# Patient Record
Sex: Female | Born: 1935
Health system: Southern US, Community
[De-identification: ages and names within clinical notes are randomized; demographics above are authoritative.]

## PROBLEM LIST (undated history)

## (undated) DIAGNOSIS — E039 Hypothyroidism, unspecified: Secondary | ICD-10-CM

## (undated) DIAGNOSIS — Z808 Family history of malignant neoplasm of other organs or systems: Secondary | ICD-10-CM

## (undated) DIAGNOSIS — Z8619 Personal history of other infectious and parasitic diseases: Secondary | ICD-10-CM

## (undated) DIAGNOSIS — I10 Essential (primary) hypertension: Secondary | ICD-10-CM

## (undated) DIAGNOSIS — E162 Hypoglycemia, unspecified: Secondary | ICD-10-CM

## (undated) DIAGNOSIS — M8580 Other specified disorders of bone density and structure, unspecified site: Secondary | ICD-10-CM

## (undated) DIAGNOSIS — Z803 Family history of malignant neoplasm of breast: Secondary | ICD-10-CM

## (undated) DIAGNOSIS — I872 Venous insufficiency (chronic) (peripheral): Secondary | ICD-10-CM

## (undated) DIAGNOSIS — L409 Psoriasis, unspecified: Secondary | ICD-10-CM

## (undated) DIAGNOSIS — K219 Gastro-esophageal reflux disease without esophagitis: Secondary | ICD-10-CM

## (undated) DIAGNOSIS — C4491 Basal cell carcinoma of skin, unspecified: Secondary | ICD-10-CM

## (undated) DIAGNOSIS — Z85828 Personal history of other malignant neoplasm of skin: Secondary | ICD-10-CM

## (undated) DIAGNOSIS — L980 Pyogenic granuloma: Secondary | ICD-10-CM

## (undated) DIAGNOSIS — L0390 Cellulitis, unspecified: Secondary | ICD-10-CM

## (undated) DIAGNOSIS — I890 Lymphedema, not elsewhere classified: Secondary | ICD-10-CM

## (undated) DIAGNOSIS — M1990 Unspecified osteoarthritis, unspecified site: Secondary | ICD-10-CM

## (undated) HISTORY — DX: Cellulitis, unspecified: L03.90

## (undated) HISTORY — DX: Pyogenic granuloma: L98.0

## (undated) HISTORY — DX: Family history of malignant neoplasm of other organs or systems: Z80.8

## (undated) HISTORY — DX: Hypothyroidism, unspecified: E03.9

## (undated) HISTORY — DX: Family history of malignant neoplasm of breast: Z80.3

## (undated) HISTORY — PX: BREAST BIOPSY: SHX20

## (undated) HISTORY — DX: Personal history of other infectious and parasitic diseases: Z86.19

## (undated) HISTORY — PX: CATARACT EXTRACTION: SUR2

## (undated) HISTORY — DX: Personal history of other malignant neoplasm of skin: Z85.828

## (undated) HISTORY — PX: VEIN LIGATION AND STRIPPING: SHX2653

## (undated) HISTORY — PX: BASAL CELL CARCINOMA EXCISION: SHX1214

## (undated) HISTORY — DX: Venous insufficiency (chronic) (peripheral): I87.2

## (undated) HISTORY — DX: Other specified disorders of bone density and structure, unspecified site: M85.80

## (undated) HISTORY — DX: Basal cell carcinoma of skin, unspecified: C44.91

## (undated) HISTORY — DX: Unspecified osteoarthritis, unspecified site: M19.90

---

## 2003-12-29 HISTORY — PX: NASAL RECONSTRUCTION: SHX2069

## 2008-07-09 ENCOUNTER — Emergency Department: Payer: Self-pay | Admitting: Emergency Medicine

## 2008-07-16 ENCOUNTER — Emergency Department: Payer: Self-pay | Admitting: Emergency Medicine

## 2009-06-27 DIAGNOSIS — M8580 Other specified disorders of bone density and structure, unspecified site: Secondary | ICD-10-CM

## 2009-06-27 HISTORY — DX: Other specified disorders of bone density and structure, unspecified site: M85.80

## 2009-06-27 HISTORY — PX: OTHER SURGICAL HISTORY: SHX169

## 2011-03-16 ENCOUNTER — Other Ambulatory Visit: Payer: Self-pay

## 2011-06-09 ENCOUNTER — Encounter: Payer: Self-pay | Admitting: Psychiatry

## 2011-06-28 ENCOUNTER — Encounter: Payer: Self-pay | Admitting: Psychiatry

## 2011-07-29 ENCOUNTER — Encounter: Payer: Self-pay | Admitting: Psychiatry

## 2011-07-30 ENCOUNTER — Ambulatory Visit (INDEPENDENT_AMBULATORY_CARE_PROVIDER_SITE_OTHER): Payer: Medicare Other | Admitting: Family Medicine

## 2011-07-30 ENCOUNTER — Encounter: Payer: Self-pay | Admitting: Family Medicine

## 2011-07-30 VITALS — BP 136/80 | HR 84 | Temp 99.1°F | Ht 63.5 in | Wt 171.2 lb

## 2011-07-30 DIAGNOSIS — M949 Disorder of cartilage, unspecified: Secondary | ICD-10-CM

## 2011-07-30 DIAGNOSIS — M899 Disorder of bone, unspecified: Secondary | ICD-10-CM

## 2011-07-30 DIAGNOSIS — Z1211 Encounter for screening for malignant neoplasm of colon: Secondary | ICD-10-CM

## 2011-07-30 DIAGNOSIS — M1990 Unspecified osteoarthritis, unspecified site: Secondary | ICD-10-CM | POA: Insufficient documentation

## 2011-07-30 DIAGNOSIS — I872 Venous insufficiency (chronic) (peripheral): Secondary | ICD-10-CM

## 2011-07-30 DIAGNOSIS — H409 Unspecified glaucoma: Secondary | ICD-10-CM

## 2011-07-30 DIAGNOSIS — E039 Hypothyroidism, unspecified: Secondary | ICD-10-CM

## 2011-07-30 DIAGNOSIS — M8580 Other specified disorders of bone density and structure, unspecified site: Secondary | ICD-10-CM

## 2011-07-30 NOTE — Patient Instructions (Addendum)
Revise dosis de calcio y vitamina D y llamenos si distinto de lo que hemos escrito abajo. Para las piernas - seguir [employer] de compresion.  Limite la sal en la dieta.  Mas agua, eleve los pies cuando esta sentada. Para la vacuna shingles (zostavax) y tetano (Tdap) - llame al seguro para ver si esta cubierto y para ver donde prefieren que se administre. Para vacuna contra pneumonia, chequee si fue dada en [place].  si no, la podemos dar aqui. Gusto concerla hoy, llamenos con preguntas.   Pedire records de sus doctores en [place]. Stool kit sent today. Regrese en 3-4 meses para ver como sigue todo.

## 2011-07-30 NOTE — Assessment & Plan Note (Signed)
On evista x >20 yrs, on fosamax, on ca/vit D.

## 2011-07-30 NOTE — Assessment & Plan Note (Signed)
Followed by endo. Will request records from [place].

## 2011-07-30 NOTE — Progress Notes (Signed)
Subjective:    Patient ID: Beth Rodgers, female    DOB: 27-May-1935, 76 y.o.   MRN: [id number]  HPI CC: new pt, establish  76 yo presents to establish care.  Moved recently from [place] 1 1/2 yrs ago.  Still goes there frequently, has doctors there to follow chronic problems.  Sees endocrinologist every 6 mo to follow thyroid (hypothyroid). H/o BCC in nose 7 yrs ago s/p extensive nasal surgery.  To travel back to [facility] tomorrow. H/o vein stripping.  H/o cataract surgery.  H/o elevated intraocular pressure, glaucoma. H/o chronic venous insufficiency since young age, uses compression stockings 30-40mmHg intermittently.  H/o stasis dermatitis. H/o back arthritis, h/o knee pain never had surgery. H/o osteopenia.  Currently undergoing PT for achilles tedon issue, followed by [facility] podiatry.  evista started at menopause ~20 yrs ago.  No abnl uterine bleeding.  Preventative: States UTD mammogram (2011), never had colonoscopy, declines would consider stool kit. No recent pap smear, last had 7 yrs ago.  Painful, doesn't think would like to rpt. States had normal cholesterol levels in past. Last tetanus >10 yrs ago. No shingles shot, no pneumonia shot that pt knows.  Medications and allergies reviewed and updated in chart. There is no problem list on file for this patient.  Past Medical History  Diagnosis Date  . Osteoarthritis     back pain, L knee pain  . Osteopenia   . Hypothyroid   . History of chicken pox   . Glaucoma   . Chronic venous insufficiency   . History  of basal cell carcinoma    Past Surgical History  Procedure Date  . Cataract extraction     bilateral  . Vein ligation and stripping   . Nasal reconstruction 2005    for BCC L nare   History  Substance Use Topics  . Smoking status: Never Smoker   . Smokeless tobacco: Never Used  . Alcohol Use: Yes     Occasional   Family History  Problem Relation Age of Onset  . Cancer Mother     thyroid cancer  . Heart disease  Father     CHF  . Diabetes Father   . Coronary artery disease Maternal Uncle   . Stroke Neg Hx    No Known Allergies No current outpatient prescriptions on file prior to visit.   Review of Systems  Constitutional: Negative for fever, chills, activity change, appetite change, fatigue and unexpected weight change.  HENT: Negative for hearing loss and neck pain.   Eyes: Negative for visual disturbance.  Respiratory: Negative for cough, chest tightness, shortness of breath and wheezing.   Cardiovascular: Negative for chest pain, palpitations and leg swelling.  Gastrointestinal: Negative for nausea, vomiting, abdominal pain, diarrhea, constipation, blood in stool and abdominal distention.  Genitourinary: Negative for hematuria and difficulty urinating.  Musculoskeletal: Negative for myalgias and arthralgias.  Skin: Negative for rash.  Neurological: Negative for dizziness, seizures, syncope and headaches.  Hematological: Does not bruise/bleed easily.  Psychiatric/Behavioral: Negative for dysphoric mood. The patient is not nervous/anxious.       Objective:   Physical Exam  Nursing note and vitals reviewed. Constitutional: She is oriented to person, place, and time. She appears well-developed and well-nourished. No distress.  HENT:  Head: Normocephalic and atraumatic.  Right Ear: External ear normal.  Left Ear: External ear normal.  Nose: Nose normal.  Mouth/Throat: Oropharynx is clear and moist.  Eyes: Conjunctivae and EOM are normal. Pupils are equal, round, and reactive to  light.  Neck: Normal range of motion. Neck supple. No thyromegaly present.  Cardiovascular: Normal rate, regular rhythm, normal heart sounds and intact distal pulses.   No murmur heard. Pulses:      Radial pulses are 2+ on the right side, and 2+ on the left side.  Pulmonary/Chest: Effort normal and breath sounds normal. No respiratory distress. She has no wheezes. She has no rales.  Abdominal: Soft. Bowel sounds  are normal. She exhibits no distension and no mass. There is no tenderness. There is no rebound and no guarding.  Musculoskeletal: Normal range of motion.  Lymphadenopathy:    She has no cervical adenopathy.  Neurological: She is alert and oriented to person, place, and time.       CN grossly intact, station and gait intact  Skin: Skin is warm and dry. No rash noted.  Psychiatric: She has a normal mood and affect. Her behavior is normal. Judgment and thought content normal.          Assessment & Plan:

## 2011-07-30 NOTE — Assessment & Plan Note (Signed)
Followed by ophtho.

## 2011-07-30 NOTE — Assessment & Plan Note (Signed)
Discussed limiting salt, elevating legs, continued use of compression stockings.

## 2011-07-30 NOTE — Assessment & Plan Note (Signed)
Request records from prior pcp.

## 2011-07-30 NOTE — Assessment & Plan Note (Signed)
Set up with iFOB.

## 2011-08-05 ENCOUNTER — Other Ambulatory Visit: Payer: Medicare Other

## 2011-08-06 ENCOUNTER — Other Ambulatory Visit: Payer: Self-pay | Admitting: Family Medicine

## 2011-08-06 ENCOUNTER — Encounter: Payer: Self-pay | Admitting: *Deleted

## 2011-08-06 DIAGNOSIS — Z1211 Encounter for screening for malignant neoplasm of colon: Secondary | ICD-10-CM

## 2011-08-06 LAB — FECAL OCCULT BLOOD, IMMUNOCHEMICAL: Fecal Occult Bld: NEGATIVE

## 2011-08-29 ENCOUNTER — Encounter: Payer: Self-pay | Admitting: Psychiatry

## 2012-01-29 HISTORY — PX: RADIOFREQUENCY ABLATION: SHX2290

## 2012-02-03 DIAGNOSIS — I8310 Varicose veins of unspecified lower extremity with inflammation: Secondary | ICD-10-CM | POA: Diagnosis not present

## 2012-02-11 DIAGNOSIS — I872 Venous insufficiency (chronic) (peripheral): Secondary | ICD-10-CM | POA: Diagnosis not present

## 2012-02-11 DIAGNOSIS — M7989 Other specified soft tissue disorders: Secondary | ICD-10-CM | POA: Diagnosis not present

## 2012-02-11 DIAGNOSIS — Z9889 Other specified postprocedural states: Secondary | ICD-10-CM | POA: Diagnosis not present

## 2012-02-11 DIAGNOSIS — E039 Hypothyroidism, unspecified: Secondary | ICD-10-CM | POA: Diagnosis not present

## 2012-02-12 DIAGNOSIS — J209 Acute bronchitis, unspecified: Secondary | ICD-10-CM | POA: Diagnosis not present

## 2012-02-24 DIAGNOSIS — I872 Venous insufficiency (chronic) (peripheral): Secondary | ICD-10-CM | POA: Diagnosis not present

## 2012-02-24 DIAGNOSIS — Z9889 Other specified postprocedural states: Secondary | ICD-10-CM | POA: Diagnosis not present

## 2012-02-24 DIAGNOSIS — E785 Hyperlipidemia, unspecified: Secondary | ICD-10-CM | POA: Diagnosis not present

## 2012-02-24 DIAGNOSIS — I83893 Varicose veins of bilateral lower extremities with other complications: Secondary | ICD-10-CM | POA: Diagnosis not present

## 2012-02-24 DIAGNOSIS — I10 Essential (primary) hypertension: Secondary | ICD-10-CM | POA: Diagnosis not present

## 2012-02-24 DIAGNOSIS — E039 Hypothyroidism, unspecified: Secondary | ICD-10-CM | POA: Diagnosis not present

## 2012-03-03 DIAGNOSIS — I872 Venous insufficiency (chronic) (peripheral): Secondary | ICD-10-CM | POA: Diagnosis not present

## 2012-03-03 DIAGNOSIS — M7989 Other specified soft tissue disorders: Secondary | ICD-10-CM | POA: Diagnosis not present

## 2012-03-03 DIAGNOSIS — I749 Embolism and thrombosis of unspecified artery: Secondary | ICD-10-CM | POA: Diagnosis not present

## 2012-03-03 DIAGNOSIS — Z9889 Other specified postprocedural states: Secondary | ICD-10-CM | POA: Diagnosis not present

## 2012-03-03 DIAGNOSIS — M79609 Pain in unspecified limb: Secondary | ICD-10-CM | POA: Diagnosis not present

## 2012-03-03 DIAGNOSIS — I8310 Varicose veins of unspecified lower extremity with inflammation: Secondary | ICD-10-CM | POA: Diagnosis not present

## 2012-03-30 ENCOUNTER — Ambulatory Visit (INDEPENDENT_AMBULATORY_CARE_PROVIDER_SITE_OTHER): Payer: Medicare Other | Admitting: Family Medicine

## 2012-03-30 ENCOUNTER — Encounter: Payer: Self-pay | Admitting: Family Medicine

## 2012-03-30 VITALS — BP 124/82 | HR 68 | Temp 98.4°F | Wt 174.0 lb

## 2012-03-30 DIAGNOSIS — M8580 Other specified disorders of bone density and structure, unspecified site: Secondary | ICD-10-CM

## 2012-03-30 DIAGNOSIS — Z1231 Encounter for screening mammogram for malignant neoplasm of breast: Secondary | ICD-10-CM

## 2012-03-30 DIAGNOSIS — Z6379 Other stressful life events affecting family and household: Secondary | ICD-10-CM | POA: Insufficient documentation

## 2012-03-30 DIAGNOSIS — Z01419 Encounter for gynecological examination (general) (routine) without abnormal findings: Secondary | ICD-10-CM

## 2012-03-30 DIAGNOSIS — I872 Venous insufficiency (chronic) (peripheral): Secondary | ICD-10-CM

## 2012-03-30 DIAGNOSIS — Z639 Problem related to primary support group, unspecified: Secondary | ICD-10-CM

## 2012-03-30 DIAGNOSIS — Z1211 Encounter for screening for malignant neoplasm of colon: Secondary | ICD-10-CM

## 2012-03-30 DIAGNOSIS — Z0000 Encounter for general adult medical examination without abnormal findings: Secondary | ICD-10-CM | POA: Diagnosis not present

## 2012-03-30 DIAGNOSIS — M899 Disorder of bone, unspecified: Secondary | ICD-10-CM

## 2012-03-30 DIAGNOSIS — E039 Hypothyroidism, unspecified: Secondary | ICD-10-CM

## 2012-03-30 DIAGNOSIS — Z85828 Personal history of other malignant neoplasm of skin: Secondary | ICD-10-CM
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
## 2012-03-30 NOTE — Assessment & Plan Note (Signed)
With varicose veins - would like referral to local vascular surgeon for second opinion. Will request records from procedure done in [place] and refer.
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
## 2012-03-30 NOTE — Patient Instructions (Signed)
Pass by Beth Rodgers's office for referral for mammogram and GYN referral as well as VVS referral. Yo voy a consultar con la Dr. Laymond Purser. Regresar en ayunas cuando pueda para chequear sangre y luego para medicare wellness check. Return at your convenience for blood work and afterwards for [employer] check. pida records de sus doctores (especialmente cirugano vascular).

## 2012-03-30 NOTE — Assessment & Plan Note (Signed)
Normal iFOB 07/2011.  Declines colonoscopy.

## 2012-03-30 NOTE — Assessment & Plan Note (Signed)
No results found for this basename: TSH  due for TSH check - will check with next blood work (when returns fasting for CPE labs)

## 2012-03-30 NOTE — Assessment & Plan Note (Signed)
Refer to GYN for well woman. Return at her convenience fasting for blood work and afterwards for [employer] visit.

## 2012-04-03 ENCOUNTER — Encounter: Payer: Self-pay | Admitting: Family Medicine

## 2012-04-05 DIAGNOSIS — L97919 Non-pressure chronic ulcer of unspecified part of right lower leg with unspecified severity: Secondary | ICD-10-CM | POA: Diagnosis not present

## 2012-04-05 DIAGNOSIS — L97929 Non-pressure chronic ulcer of unspecified part of left lower leg with unspecified severity: Secondary | ICD-10-CM | POA: Diagnosis not present

## 2012-04-08 ENCOUNTER — Ambulatory Visit (INDEPENDENT_AMBULATORY_CARE_PROVIDER_SITE_OTHER): Payer: Medicare Other | Admitting: Family Medicine

## 2012-04-08 ENCOUNTER — Telehealth: Payer: Self-pay | Admitting: Family Medicine

## 2012-04-08 ENCOUNTER — Encounter: Payer: Self-pay | Admitting: Family Medicine

## 2012-04-08 VITALS — BP 130/80 | HR 84 | Temp 97.8°F | Wt 176.8 lb

## 2012-04-08 DIAGNOSIS — Z639 Problem related to primary support group, unspecified: Secondary | ICD-10-CM

## 2012-04-08 NOTE — Telephone Encounter (Signed)
Patient is asking for Dr.Gutierrez to call her.  Patient needs advice from Dr.Gutierrez.  She said she'll be driving here to see him,if she doesn't hear from him.

## 2012-04-08 NOTE — Telephone Encounter (Signed)
At her last visit she mentioned to you that she had a bipolar son and you made an appt with psy appt but, she is having a hard time with anxiety. Discussed with dr g and he will see her at 4:45 or will call her after work

## 2012-04-09 ENCOUNTER — Encounter: Payer: Self-pay | Admitting: Family Medicine
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
## 2012-04-12 ENCOUNTER — Telehealth: Payer: Self-pay | Admitting: Family Medicine

## 2012-04-12 NOTE — Telephone Encounter (Signed)
I spoke with [facility] RE patient. Please notify patient that social worker with [facility] will be in contact with her regarding referral that was made in next 10 days.  To please let me know in interim if any concerns. Will route to [facility] as [place] as well.

## 2012-04-13 NOTE — Telephone Encounter (Signed)
Patient notified and will await a call from [facility].

## 2012-04-19 ENCOUNTER — Encounter: Payer: Self-pay | Admitting: Obstetrics & Gynecology

## 2012-04-19 ENCOUNTER — Other Ambulatory Visit (HOSPITAL_COMMUNITY)
Admission: RE | Admit: 2012-04-19 | Discharge: 2012-04-19 | Disposition: A | Discharge status: Home / Self Care | Payer: Medicare Other | Source: Ambulatory Visit | Attending: Obstetrics & Gynecology | Admitting: Obstetrics & Gynecology

## 2012-04-19 ENCOUNTER — Ambulatory Visit (INDEPENDENT_AMBULATORY_CARE_PROVIDER_SITE_OTHER): Payer: Medicare Other | Admitting: Obstetrics & Gynecology

## 2012-04-19 VITALS — BP 158/88 | HR 71 | Ht 66.0 in | Wt 173.0 lb

## 2012-04-19 DIAGNOSIS — Z124 Encounter for screening for malignant neoplasm of cervix: Secondary | ICD-10-CM | POA: Diagnosis not present

## 2012-04-19 DIAGNOSIS — Z01419 Encounter for gynecological examination (general) (routine) without abnormal findings: Secondary | ICD-10-CM

## 2012-04-19 DIAGNOSIS — Z0000 Encounter for general adult medical examination without abnormal findings: Secondary | ICD-10-CM

## 2012-04-19 NOTE — Progress Notes (Signed)
Patient is here for gyn exam

## 2012-04-19 NOTE — Progress Notes (Signed)
Subjective:    Beth Rodgers is a 77 y.o. female who presents for an annual exam. The patient has no complaints today. The patient is not currently sexually active. GYN screening history: last pap: was normal. The patient wears seatbelts: yes. The patient participates in regular exercise: no. Has the patient ever been transfused or tattooed?: not asked. The patient reports that there is not domestic violence in her life.   Menstrual History: OB History    Grav Para Term Preterm Abortions TAB SAB Ect Mult Living                  Menarche age: 7 No LMP recorded. Patient is postmenopausal.    The following portions of the patient's history were reviewed and updated as appropriate: allergies, current medications, past family history, past medical history, past social history, past surgical history and problem list.  Review of Systems A comprehensive review of systems was negative.    Objective:    BP 158/88  Pulse 71  Ht 5\' 6"  (1.676 m)  Wt 173 lb (78.472 kg)  BMI 27.92 kg/m2  General Appearance:    Alert, cooperative, no distress, appears stated age  Head:    Normocephalic, without obvious abnormality, atraumatic  Eyes:    PERRL, conjunctiva/corneas clear, EOM's intact, fundi    benign, both eyes  Ears:    Normal TM's and external ear canals, both ears  Nose:   Nares normal, septum midline, mucosa normal, no drainage    or sinus tenderness  Throat:   Lips, mucosa, and tongue normal; teeth and gums normal  Neck:   Supple, symmetrical, trachea midline, no adenopathy;    thyroid:  no enlargement/tenderness/nodules; no carotid   bruit or JVD  Back:     Symmetric, no curvature, ROM normal, no CVA tenderness  Lungs:     Clear to auscultation bilaterally, respirations unlabored  Chest Wall:    No tenderness or deformity   Heart:    Regular rate and rhythm, S1 and S2 normal, no murmur, rub   or gallop  Breast Exam:    No tenderness, masses, or nipple abnormality  Abdomen:     Soft,  non-tender, bowel sounds active all four quadrants,    no masses, no organomegaly  Genitalia:    Normal female without lesion, discharge or tenderness, severe atrophy, NSSA, NT, no adnexal masses     Extremities:   Extremities normal, atraumatic, no cyanosis or edema  Pulses:   2+ and symmetric all extremities  Skin:   Skin color, texture, turgor normal, no rashes or lesions  Lymph nodes:   Cervical, supraclavicular, and axillary nodes normal  Neurologic:   CNII-XII intact, normal strength, sensation and reflexes    throughout  .    Assessment:    Healthy female exam.    Plan:     Mammogram. Pap smear.

## 2012-04-27 ENCOUNTER — Ambulatory Visit: Payer: Medicare Other | Admitting: Psychology

## 2012-05-04 DIAGNOSIS — I83893 Varicose veins of bilateral lower extremities with other complications: Secondary | ICD-10-CM | POA: Diagnosis not present

## 2012-05-04 DIAGNOSIS — I872 Venous insufficiency (chronic) (peripheral): Secondary | ICD-10-CM | POA: Diagnosis not present

## 2012-05-05 ENCOUNTER — Encounter: Payer: Self-pay | Admitting: Family Medicine

## 2012-05-09 ENCOUNTER — Telehealth: Payer: Self-pay

## 2012-05-09 NOTE — Telephone Encounter (Signed)
Pt called requesting medical equipment store near our office. I gave pt [facility] medical # [phone].

## 2012-05-10 ENCOUNTER — Telehealth: Payer: Self-pay | Admitting: Family Medicine

## 2012-05-10 NOTE — Telephone Encounter (Signed)
Patient LMOM that the vascular Dr wanted her to get compression hose and wanted you to tell her where to go in [place] to get them. Also that Social Services has not contacted her yet.

## 2012-05-11 ENCOUNTER — Ambulatory Visit (INDEPENDENT_AMBULATORY_CARE_PROVIDER_SITE_OTHER): Payer: Medicare Other | Admitting: Psychology

## 2012-05-11 DIAGNOSIS — F4323 Adjustment disorder with mixed anxiety and depressed mood: Secondary | ICD-10-CM

## 2012-05-11 NOTE — Telephone Encounter (Signed)
Spoke with [facility]. They said they had tried numerous times to contact patient and every time they called, her number was disconnected. They are going to try again today. I left patient a message advising to expect their call and left info on where she could try to get compression hose. Instructed to call me with any questions.

## 2012-05-11 NOTE — Telephone Encounter (Signed)
plz apologize she has not heard anything from sw - I will check in to this.  Can we check with [facility] about referral?  I placed referral by calling them and speaking directly to them 04/12/2012. As far as durable medical supply store in [place] - can try   [facility]  [address] [place], [place] [address]  Map [phone]    or   [facility] [employer] [facility] [employer]  [address] [place], [place], [place] [address]  Map [phone]  . Or there are several in [place] - I'm not as familiar as in [place].

## 2012-05-17 ENCOUNTER — Other Ambulatory Visit (INDEPENDENT_AMBULATORY_CARE_PROVIDER_SITE_OTHER): Payer: Medicare Other

## 2012-05-17 DIAGNOSIS — E039 Hypothyroidism, unspecified: Secondary | ICD-10-CM | POA: Diagnosis not present

## 2012-05-17 DIAGNOSIS — M899 Disorder of bone, unspecified: Secondary | ICD-10-CM | POA: Diagnosis not present

## 2012-05-17 DIAGNOSIS — M8580 Other specified disorders of bone density and structure, unspecified site: Secondary | ICD-10-CM

## 2012-05-17 LAB — CBC WITH DIFFERENTIAL/PLATELET
Basophils Absolute: 0 10*3/uL (ref 0.0–0.1)
Basophils Relative: 0.4 % (ref 0.0–3.0)
Eosinophils Absolute: 0.1 10*3/uL (ref 0.0–0.7)
Lymphocytes Relative: 30.7 % (ref 12.0–46.0)
MCHC: 33.5 g/dL (ref 30.0–36.0)
MCV: 87.1 fl (ref 78.0–100.0)
Monocytes Absolute: 0.6 10*3/uL (ref 0.1–1.0)
Neutrophils Relative %: 56.1 % (ref 43.0–77.0)
RBC: 4.61 Mil/uL (ref 3.87–5.11)
RDW: 14.6 % (ref 11.5–14.6)

## 2012-05-17 LAB — COMPREHENSIVE METABOLIC PANEL
AST: 17 U/L (ref 0–37)
BUN: 15 mg/dL (ref 6–23)
Calcium: 8.5 mg/dL (ref 8.4–10.5)
Chloride: 108 mEq/L (ref 96–112)
Creatinine, Ser: 0.6 mg/dL (ref 0.4–1.2)
GFR: 101.62 mL/min (ref >60.00)

## 2012-05-17 LAB — LIPID PANEL
Cholesterol: 198 mg/dL (ref 0–200)
LDL Cholesterol: 120 mg/dL — ABNORMAL HIGH (ref 0–99)
Triglycerides: 60 mg/dL (ref 0.0–149.0)
VLDL: 12 mg/dL (ref 0.0–40.0)

## 2012-05-17 LAB — TSH: TSH: 2.93 u[IU]/mL (ref 0.35–5.50)

## 2012-05-18 ENCOUNTER — Ambulatory Visit (INDEPENDENT_AMBULATORY_CARE_PROVIDER_SITE_OTHER): Payer: Medicare Other | Admitting: Psychology

## 2012-05-18 DIAGNOSIS — F4323 Adjustment disorder with mixed anxiety and depressed mood: Secondary | ICD-10-CM

## 2012-05-18 LAB — VITAMIN D 25 HYDROXY (VIT D DEFICIENCY, FRACTURES): Vit D, 25-Hydroxy: 35 ng/mL (ref 30–89)

## 2012-05-24 ENCOUNTER — Ambulatory Visit (INDEPENDENT_AMBULATORY_CARE_PROVIDER_SITE_OTHER): Payer: Medicare Other | Admitting: Family Medicine

## 2012-05-24 ENCOUNTER — Encounter: Payer: Self-pay | Admitting: Family Medicine

## 2012-05-24 VITALS — BP 136/84 | HR 86 | Temp 98.5°F | Ht 66.0 in | Wt 173.5 lb

## 2012-05-24 DIAGNOSIS — M8580 Other specified disorders of bone density and structure, unspecified site: Secondary | ICD-10-CM

## 2012-05-24 DIAGNOSIS — Z23 Encounter for immunization: Secondary | ICD-10-CM

## 2012-05-24 DIAGNOSIS — Z0000 Encounter for general adult medical examination without abnormal findings: Secondary | ICD-10-CM | POA: Diagnosis not present

## 2012-05-24 DIAGNOSIS — I872 Venous insufficiency (chronic) (peripheral): Secondary | ICD-10-CM

## 2012-05-24 DIAGNOSIS — M899 Disorder of bone, unspecified: Secondary | ICD-10-CM

## 2012-05-24 MED ORDER — RALOXIFENE HCL 60 MG PO TABS: 60.0000 mg | ORAL_TABLET | Freq: Every day | ORAL | Status: DC

## 2012-05-24 MED ORDER — LEVOTHYROXINE SODIUM 75 MCG PO TABS: 75.0000 ug | ORAL_TABLET | Freq: Every day | ORAL | Status: DC

## 2012-05-24 MED ORDER — ALENDRONATE SODIUM 70 MG PO TABS: 70.0000 mg | ORAL_TABLET | ORAL | Status: DC

## 2012-05-24 NOTE — Assessment & Plan Note (Signed)
encouraged regular compression stocking use.

## 2012-05-24 NOTE — Assessment & Plan Note (Addendum)
Reviewed recent dexa scan and input into chart - osteopenic.

## 2012-05-24 NOTE — Assessment & Plan Note (Signed)
I have personally reviewed the Medicare Annual Wellness questionnaire and have noted 1. The patient's medical and social history 2. Their use of alcohol, tobacco or illicit drugs 3. Their current medications and supplements 4. The patient's functional ability including ADL's, fall risks, home safety risks and hearing or visual impairment. 5. Diet and physical activity 6. Evidence for depression or mood disorders The patients weight, height, BMI have been recorded in the chart.  Hearing and vision has been addressed. I have made referrals, counseling and provided education to the patient based review of the above and I have provided the pt with a written personalized care plan for preventive services. See scanned questionairre. Advanced directives discussed: provided with packet of information.  Reviewed preventative protocols and updated unless pt declined. Td today. iFOB due 07/2012. Well woman with OBGYN.

## 2012-05-24 NOTE — Progress Notes (Signed)
Subjective:    Patient ID: Beth Rodgers, female    DOB: 06-02-1935, 77 y.o.   MRN: [id number]  HPI CC: medicare wellness  Things going well with son for last week.  Has seen Dr. Laymond Purser which pt feels is helping.    Saw vein doctor (Dr. Anda Kraft at [facility]) - rec compression stockings first thing in am.  Has noted improvement in leg pain and veins.  To return to see her July 3rd.  Urinary hesitance each void.  No accidents, urgency, no stress incontinence sxs.  Overall not bothering her.  Preventative:  Mammogram scheduled 06/13/2012 - at [facility]. Vision screen due 06/2012.  Following elevated eye pressure on left side. Declines colonoscopy, would like yearly stool kit.  Last done 08/06/2011. Pelvic exam 03/2012 with Dr. Marice Potter.  breast exam during well woman with OBGYN. Last tetanus >10 yrs ago. Requests today. No shingles shot no pneumonia shot Occasionally receives flu shot. Advanced directives: has not thought about this.  Would like packet with information.  Denies depression, anhedonia Denies recent falls.  Caffeine: 2-3 cups/day Lives with husband, majority of time alone as he travels to [place]. Has grandchildren nearby. Occ: Psychologist, educational, Wellsite geologist Activity: bed exercises, limiting walking Diet: does get fruits and vegetables, only some water  Medications and allergies reviewed and updated in chart.  Past histories reviewed and updated if relevant as below. Patient Active Problem List  Diagnoses  . Osteoarthritis  . Osteopenia  . Hypothyroid  . Glaucoma  . Chronic venous insufficiency  . Screen for colon cancer  . Unspecified family circumstance  . Medicare annual wellness visit, initial  . History  of basal cell carcinoma   Past Medical History  Diagnosis Date  . Osteoarthritis     back pain, L knee pain  . Osteopenia   . Hypothyroid   . History of chicken pox   . Glaucoma   . Chronic venous insufficiency     with varicose veins  . History  of basal cell  carcinoma     left nare   Past Surgical History  Procedure Date  . Cataract extraction     bilateral  . Vein ligation and stripping remote    bilateral G saphenous veins  . Nasal reconstruction 2005    for BCC L nare s/p Mohs  . Breast biopsy   . Radiofrequency ablation 01/2012    remnant L G saphenous vein below knee   History  Substance Use Topics  . Smoking status: Never Smoker   . Smokeless tobacco: Never Used  . Alcohol Use: Yes     Occasional   Family History  Problem Relation Age of Onset  . Cancer Mother     thyroid cancer  . Heart disease Father     CHF  . Diabetes Father   . Glaucoma Father   . Arthritis Father   . Coronary artery disease Maternal Uncle   . Stroke Neg Hx   . Bipolar disorder Son    No Known Allergies Current Outpatient Prescriptions on File Prior to Visit  Medication Sig Dispense Refill  . alendronate (FOSAMAX) 70 MG tablet Take 70 mg by mouth every 7 (seven) days. Take with a full glass of water on an empty stomach.       . Ascorbic Acid (VITAMIN C CR PO) Take by mouth as directed.        . betamethasone, augmented, (DIPROLENE) 0.05 % lotion Apply 1 application topically 2 (two) times daily.        Marland Kitchen  calcipotriene-betamethasone (TACLONEX) ointment Apply 1 application topically daily.        Marland Kitchen CALCIUM CARBONATE PO Take 600 mg by mouth as directed.       Marland Kitchen CICLOPIROX EX Apply topically as needed.      . Homeopathic Products (ARNICARE) GEL Apply 1 application topically as directed.        . hydrocortisone (PREPARATION H HYDROCORTISONE) 1 % cream Apply 1 application topically 2 (two) times daily.        Marland Kitchen levothyroxine (SYNTHROID, LEVOTHROID) 75 MCG tablet Take 75 mcg by mouth daily.        Marland Kitchen neomycin-bacitracin-polymyxin (NEOSPORIN) ointment Apply 1 application topically every 12 (twelve) hours. apply to eye       . pentoxifylline (TRENTAL) 400 MG CR tablet Take 400 mg by mouth 3 (three) times daily with meals.        . raloxifene (EVISTA) 60  MG tablet Take 60 mg by mouth daily.        . tacrolimus (PROTOPIC) 0.1 % ointment Apply 1 application topically 2 (two) times daily.        . travoprost, benzalkonium, (TRAVATAN) 0.004 % ophthalmic solution Place 1 drop into the left eye at bedtime.        Marland Kitchen VITAMIN D, CHOLECALCIFEROL, PO Take 1,000 Units by mouth as directed.         Review of Systems  Constitutional: Negative for fever, chills, activity change, appetite change, fatigue and unexpected weight change.  HENT: Negative for hearing loss and neck pain.   Eyes: Negative for visual disturbance.  Respiratory: Negative for cough, chest tightness, shortness of breath and wheezing.   Cardiovascular: Negative for chest pain, palpitations and leg swelling.  Gastrointestinal: Negative for nausea, vomiting, abdominal pain, diarrhea, constipation, blood in stool and abdominal distention.  Genitourinary: Negative for hematuria and difficulty urinating.  Musculoskeletal: Negative for myalgias and arthralgias.  Skin: Negative for rash.  Neurological: Negative for dizziness, seizures, syncope and headaches.  Hematological: Does not bruise/bleed easily.  Psychiatric/Behavioral: Negative for dysphoric mood. The patient is not nervous/anxious.        Objective:   Physical Exam  Nursing note and vitals reviewed. Constitutional: She is oriented to person, place, and time. She appears well-developed and well-nourished. No distress.  HENT:  Head: Normocephalic and atraumatic.  Right Ear: Hearing, tympanic membrane, external ear and ear canal normal.  Left Ear: Hearing, tympanic membrane, external ear and ear canal normal.  Nose: Nose normal.  Mouth/Throat: Oropharynx is clear and moist. No oropharyngeal exudate.  Eyes: Conjunctivae and EOM are normal. Pupils are equal, round, and reactive to light. No scleral icterus.  Neck: Normal range of motion. Neck supple. No thyromegaly present.  Cardiovascular: Normal rate, regular rhythm, normal  heart sounds and intact distal pulses.   No murmur heard. Pulses:      Radial pulses are 2+ on the right side, and 2+ on the left side.  Pulmonary/Chest: Effort normal and breath sounds normal. No respiratory distress. She has no wheezes. She has no rales.  Abdominal: Soft. Bowel sounds are normal. She exhibits no distension and no mass. There is no tenderness. There is no rebound and no guarding.  Musculoskeletal: Normal range of motion. She exhibits no edema.       compression stockings in place  Lymphadenopathy:    She has no cervical adenopathy.  Neurological: She is alert and oriented to person, place, and time.       CN grossly intact, station and gait  intact  Skin: Skin is warm and dry. No rash noted.  Psychiatric: She has a normal mood and affect. Her behavior is normal. Judgment and thought content normal.       Assessment & Plan:

## 2012-05-24 NOTE — Patient Instructions (Addendum)
Vito Backers yo mande fosamax a la Corporate investment banker. pregunta de su previo doctor si recibio la vacuna contra pneumonia - y dejeme saber. Call your insurace about the shingles shot to see if it is covered or how much it would cost and where is cheaper (here or pharmacy).  If you want to receive here, call for nurse visit.  If at pharmacy we can send prescription. vacuna contra tetano hoy.

## 2012-06-01 ENCOUNTER — Ambulatory Visit (INDEPENDENT_AMBULATORY_CARE_PROVIDER_SITE_OTHER): Payer: Medicare Other | Admitting: Psychology

## 2012-06-01 DIAGNOSIS — F4323 Adjustment disorder with mixed anxiety and depressed mood: Secondary | ICD-10-CM | POA: Diagnosis not present

## 2012-06-08 ENCOUNTER — Ambulatory Visit (INDEPENDENT_AMBULATORY_CARE_PROVIDER_SITE_OTHER): Payer: Medicare Other | Admitting: Psychology

## 2012-06-08 DIAGNOSIS — F4323 Adjustment disorder with mixed anxiety and depressed mood: Secondary | ICD-10-CM

## 2012-06-13 ENCOUNTER — Ambulatory Visit: Payer: Self-pay | Admitting: Obstetrics & Gynecology

## 2012-06-13 DIAGNOSIS — R928 Other abnormal and inconclusive findings on diagnostic imaging of breast: Secondary | ICD-10-CM | POA: Diagnosis not present

## 2012-06-13 DIAGNOSIS — Z1231 Encounter for screening mammogram for malignant neoplasm of breast: Secondary | ICD-10-CM | POA: Diagnosis not present

## 2012-06-15 ENCOUNTER — Ambulatory Visit (INDEPENDENT_AMBULATORY_CARE_PROVIDER_SITE_OTHER): Payer: Medicare Other | Admitting: Psychology

## 2012-06-15 DIAGNOSIS — F4323 Adjustment disorder with mixed anxiety and depressed mood: Secondary | ICD-10-CM | POA: Diagnosis not present

## 2012-06-20 ENCOUNTER — Encounter: Payer: Self-pay | Admitting: Obstetrics & Gynecology

## 2012-06-22 ENCOUNTER — Ambulatory Visit (INDEPENDENT_AMBULATORY_CARE_PROVIDER_SITE_OTHER): Payer: Medicare Other | Admitting: Psychology

## 2012-06-22 DIAGNOSIS — F4323 Adjustment disorder with mixed anxiety and depressed mood: Secondary | ICD-10-CM

## 2012-06-28 ENCOUNTER — Ambulatory Visit (INDEPENDENT_AMBULATORY_CARE_PROVIDER_SITE_OTHER): Payer: Medicare Other | Admitting: Psychology

## 2012-06-28 DIAGNOSIS — F4323 Adjustment disorder with mixed anxiety and depressed mood: Secondary | ICD-10-CM | POA: Diagnosis not present

## 2012-07-06 ENCOUNTER — Ambulatory Visit: Payer: Medicare Other | Admitting: Psychology

## 2012-07-12 DIAGNOSIS — D2370 Other benign neoplasm of skin of unspecified lower limb, including hip: Secondary | ICD-10-CM | POA: Diagnosis not present

## 2012-07-12 DIAGNOSIS — D485 Neoplasm of uncertain behavior of skin: Secondary | ICD-10-CM | POA: Diagnosis not present

## 2012-07-12 DIAGNOSIS — L408 Other psoriasis: Secondary | ICD-10-CM | POA: Diagnosis not present

## 2012-07-12 DIAGNOSIS — I8310 Varicose veins of unspecified lower extremity with inflammation: Secondary | ICD-10-CM | POA: Diagnosis not present

## 2012-07-12 DIAGNOSIS — L821 Other seborrheic keratosis: Secondary | ICD-10-CM | POA: Diagnosis not present

## 2012-07-13 ENCOUNTER — Ambulatory Visit: Payer: Medicare Other | Admitting: Psychology

## 2012-08-03 ENCOUNTER — Ambulatory Visit (INDEPENDENT_AMBULATORY_CARE_PROVIDER_SITE_OTHER): Payer: Medicare Other | Admitting: Psychology

## 2012-08-03 DIAGNOSIS — F4323 Adjustment disorder with mixed anxiety and depressed mood: Secondary | ICD-10-CM | POA: Diagnosis not present

## 2012-08-10 ENCOUNTER — Ambulatory Visit (INDEPENDENT_AMBULATORY_CARE_PROVIDER_SITE_OTHER): Payer: Medicare Other | Admitting: Psychology

## 2012-08-10 DIAGNOSIS — F4323 Adjustment disorder with mixed anxiety and depressed mood: Secondary | ICD-10-CM

## 2012-08-17 ENCOUNTER — Ambulatory Visit (INDEPENDENT_AMBULATORY_CARE_PROVIDER_SITE_OTHER): Payer: Medicare Other | Admitting: Psychology

## 2012-08-17 DIAGNOSIS — F4323 Adjustment disorder with mixed anxiety and depressed mood: Secondary | ICD-10-CM | POA: Diagnosis not present

## 2012-08-24 ENCOUNTER — Ambulatory Visit (INDEPENDENT_AMBULATORY_CARE_PROVIDER_SITE_OTHER): Payer: Medicare Other | Admitting: Psychology

## 2012-08-24 DIAGNOSIS — F4323 Adjustment disorder with mixed anxiety and depressed mood: Secondary | ICD-10-CM | POA: Diagnosis not present

## 2012-08-31 ENCOUNTER — Ambulatory Visit (INDEPENDENT_AMBULATORY_CARE_PROVIDER_SITE_OTHER): Payer: Medicare Other | Admitting: Psychology

## 2012-08-31 DIAGNOSIS — F4323 Adjustment disorder with mixed anxiety and depressed mood: Secondary | ICD-10-CM

## 2012-09-07 ENCOUNTER — Ambulatory Visit (INDEPENDENT_AMBULATORY_CARE_PROVIDER_SITE_OTHER): Payer: Medicare Other | Admitting: Psychology

## 2012-09-07 DIAGNOSIS — F4323 Adjustment disorder with mixed anxiety and depressed mood: Secondary | ICD-10-CM

## 2012-09-21 ENCOUNTER — Ambulatory Visit (INDEPENDENT_AMBULATORY_CARE_PROVIDER_SITE_OTHER): Payer: Medicare Other | Admitting: Psychology

## 2012-09-21 DIAGNOSIS — F4323 Adjustment disorder with mixed anxiety and depressed mood: Secondary | ICD-10-CM | POA: Diagnosis not present

## 2012-09-28 ENCOUNTER — Ambulatory Visit (INDEPENDENT_AMBULATORY_CARE_PROVIDER_SITE_OTHER): Payer: Medicare Other | Admitting: Psychology

## 2012-09-28 DIAGNOSIS — F4323 Adjustment disorder with mixed anxiety and depressed mood: Secondary | ICD-10-CM | POA: Diagnosis not present

## 2012-10-26 ENCOUNTER — Ambulatory Visit (INDEPENDENT_AMBULATORY_CARE_PROVIDER_SITE_OTHER): Payer: Medicare Other | Admitting: Psychology

## 2012-10-26 DIAGNOSIS — F4323 Adjustment disorder with mixed anxiety and depressed mood: Secondary | ICD-10-CM | POA: Diagnosis not present

## 2012-11-01 ENCOUNTER — Ambulatory Visit: Payer: Medicare Other | Admitting: Psychology

## 2012-11-02 ENCOUNTER — Ambulatory Visit (INDEPENDENT_AMBULATORY_CARE_PROVIDER_SITE_OTHER): Payer: Medicare Other | Admitting: Psychology

## 2012-11-02 DIAGNOSIS — F4323 Adjustment disorder with mixed anxiety and depressed mood: Secondary | ICD-10-CM

## 2012-11-08 ENCOUNTER — Ambulatory Visit (INDEPENDENT_AMBULATORY_CARE_PROVIDER_SITE_OTHER): Payer: Medicare Other | Admitting: Psychology

## 2012-11-08 DIAGNOSIS — F4323 Adjustment disorder with mixed anxiety and depressed mood: Secondary | ICD-10-CM

## 2012-11-09 ENCOUNTER — Ambulatory Visit: Payer: Medicare Other | Admitting: Psychology

## 2012-11-15 ENCOUNTER — Encounter: Payer: Self-pay | Admitting: Family Medicine

## 2012-11-15 ENCOUNTER — Ambulatory Visit: Payer: Medicare Other | Admitting: Family Medicine

## 2012-11-15 ENCOUNTER — Ambulatory Visit (INDEPENDENT_AMBULATORY_CARE_PROVIDER_SITE_OTHER): Payer: Medicare Other | Admitting: Family Medicine

## 2012-11-15 VITALS — BP 144/86 | HR 80 | Temp 98.2°F | Wt 170.0 lb

## 2012-11-15 DIAGNOSIS — I872 Venous insufficiency (chronic) (peripheral): Secondary | ICD-10-CM | POA: Diagnosis not present

## 2012-11-15 DIAGNOSIS — R1312 Dysphagia, oropharyngeal phase: Secondary | ICD-10-CM | POA: Insufficient documentation

## 2012-11-15 DIAGNOSIS — Z639 Problem related to primary support group, unspecified: Secondary | ICD-10-CM

## 2012-11-15 DIAGNOSIS — M8580 Other specified disorders of bone density and structure, unspecified site: Secondary | ICD-10-CM

## 2012-11-15 DIAGNOSIS — M899 Disorder of bone, unspecified: Secondary | ICD-10-CM

## 2012-11-15 DIAGNOSIS — Z23 Encounter for immunization: Secondary | ICD-10-CM

## 2012-11-15 DIAGNOSIS — M949 Disorder of cartilage, unspecified: Secondary | ICD-10-CM | POA: Diagnosis not present

## 2012-11-15 NOTE — Assessment & Plan Note (Signed)
Much improved home situation. Recommend continue f/u with Dr. Laymond Purser.

## 2012-11-15 NOTE — Patient Instructions (Addendum)
Call your insurance about the shingles shot to see if it is covered or how much it would cost and where is cheaper (here or pharmacy).  If you want to receive here, call for nurse visit. Flu shot and pneumovax today.  Try to get most or all of your calcium from your food--aim for 1200 mg/day for women over 50 and men over 70.  To figure out dietary calcium: 300 mg/day from all non dairy foods plus 300 mg per cup of milk, other dairy, or fortified juice.  Non dairy foods that contain calcium: Kale, oranges, sardines, oatmeal, soy milk/soybeans, salmon, white beans, dried figs, turnip greens, almonds, broccoli, tofu.

## 2012-11-15 NOTE — Assessment & Plan Note (Signed)
Mild, intermittent. Discussed referral to ENT.  Pt will monitor for now. Exam normal today, no LAD, no evidence of tonsilloliths.

## 2012-11-15 NOTE — Assessment & Plan Note (Signed)
Has been on bisphosphonate for >5 yrs.  Discussed this. May consider stopping at next physical.

## 2012-11-15 NOTE — Progress Notes (Addendum)
  Subjective:    Patient ID: Beth Rodgers, female    DOB: 08-17-1935, 77 y.o.   MRN: [id number]  HPI CC: 6 mo f/u  Has seen significant benefit with Dr. Laymond Purser.  Feels supported.  Planning on trip to [place] for 2 wks.  Would like flu shot and pneumonia shot today.  Will check into shingles shot. Lab Results  Component Value Date   TSH 2.93 05/17/2012    Osteopenia - on fosamax for 5+ years.  Also on evista.  May not get enough calcium in diet.  Having some trouble with swallowing, very intermittently.  Feels food stays stuck in tonsils.  No liquid dysphagia. Wt Readings from Last 3 Encounters:  11/15/12 170 lb (77.111 kg)  05/24/12 173 lb 8 oz (78.699 kg)  04/19/12 173 lb (78.472 kg)    Varicose veins - saw [facility] physician x1, then they cancelled x2.  Decided not to f/u with them anymore.  Has continued using compression stockings which has significantly helped.  Would like to establish more locally.   BP Readings from Last 3 Encounters:  11/15/12 144/86  05/24/12 136/84  04/19/12 158/88    Past Medical History  Diagnosis Date  . Osteoarthritis     back pain, L knee pain  . Osteopenia 06/2009    DEXA T score Spine: -1.8, hip -2.0, on longterm bisphosphonate  . Hypothyroid   . History of chicken pox   . Glaucoma(365)   . Chronic venous insufficiency     with varicose veins  . History  of basal cell carcinoma     left nare    Past Surgical History  Procedure Date  . Cataract extraction     bilateral  . Vein ligation and stripping remote    bilateral G saphenous veins  . Nasal reconstruction 2005    for BCC L nare s/p Mohs  . Breast biopsy   . Radiofrequency ablation 01/2012    remnant L G saphenous vein below knee  . Dexa 06/2009    T score Spine: -1.8, hip -2.0    Review of Systems Per HPI    Objective:   Physical Exam  Nursing note and vitals reviewed. Constitutional: She appears well-developed and well-nourished. No distress.  HENT:  Head:  Normocephalic and atraumatic.  Mouth/Throat: Uvula is midline, oropharynx is clear and moist and mucous membranes are normal. No oropharyngeal exudate, posterior oropharyngeal edema, posterior oropharyngeal erythema or tonsillar abscesses.       No tonsilloliths  Eyes: Conjunctivae normal and EOM are normal. Pupils are equal, round, and reactive to light. No scleral icterus.  Neck: Normal range of motion. Neck supple. No thyromegaly present.  Cardiovascular: Normal rate, regular rhythm, normal heart sounds and intact distal pulses.   Extrasystoles are present.  No murmur heard. Pulmonary/Chest: Effort normal and breath sounds normal. No respiratory distress. She has no wheezes. She has no rales.  Musculoskeletal: She exhibits edema (trace).       Hemosiderin deposits bilaterally Varicose veins present. RLE with mildly scaly erythematous rash, not pruritic.  States h/o stasis dermatitis  Lymphadenopathy:    She has no cervical adenopathy.  Skin: Skin is warm and dry. No rash noted.  Psychiatric: She has a normal mood and affect.      Assessment & Plan:

## 2012-11-15 NOTE — Assessment & Plan Note (Addendum)
Encouraged continued use of below the knee compression stockings. With h/o varicose veins, pt requests referral to local vein specialist to establish, but desires to continue medical management.   If worsening, will refer.  For now, continue medical management.

## 2012-11-16 ENCOUNTER — Ambulatory Visit (INDEPENDENT_AMBULATORY_CARE_PROVIDER_SITE_OTHER): Payer: Medicare Other | Admitting: Psychology

## 2012-11-16 DIAGNOSIS — F4323 Adjustment disorder with mixed anxiety and depressed mood: Secondary | ICD-10-CM

## 2012-11-23 ENCOUNTER — Ambulatory Visit: Payer: Medicare Other | Admitting: Psychology

## 2012-12-02 DIAGNOSIS — Z961 Presence of intraocular lens: Secondary | ICD-10-CM | POA: Diagnosis not present

## 2012-12-02 DIAGNOSIS — H4011X0 Primary open-angle glaucoma, stage unspecified: Secondary | ICD-10-CM | POA: Diagnosis not present

## 2012-12-14 ENCOUNTER — Ambulatory Visit (INDEPENDENT_AMBULATORY_CARE_PROVIDER_SITE_OTHER): Payer: Medicare Other | Admitting: Psychology

## 2012-12-14 DIAGNOSIS — F4323 Adjustment disorder with mixed anxiety and depressed mood: Secondary | ICD-10-CM | POA: Diagnosis not present

## 2012-12-28 HISTORY — PX: OTHER SURGICAL HISTORY: SHX169

## 2013-01-04 ENCOUNTER — Ambulatory Visit: Payer: Medicare Other | Admitting: Psychology

## 2013-01-18 ENCOUNTER — Ambulatory Visit: Payer: Medicare Other | Admitting: Psychology

## 2013-01-25 ENCOUNTER — Ambulatory Visit (INDEPENDENT_AMBULATORY_CARE_PROVIDER_SITE_OTHER): Payer: Medicare Other | Admitting: Psychology

## 2013-01-25 DIAGNOSIS — F4323 Adjustment disorder with mixed anxiety and depressed mood: Secondary | ICD-10-CM | POA: Diagnosis not present

## 2013-02-01 ENCOUNTER — Ambulatory Visit (INDEPENDENT_AMBULATORY_CARE_PROVIDER_SITE_OTHER): Payer: Medicare Other | Admitting: Psychology

## 2013-02-01 DIAGNOSIS — F4323 Adjustment disorder with mixed anxiety and depressed mood: Secondary | ICD-10-CM

## 2013-02-08 ENCOUNTER — Ambulatory Visit: Payer: Medicare Other | Admitting: Psychology

## 2013-02-15 ENCOUNTER — Ambulatory Visit (INDEPENDENT_AMBULATORY_CARE_PROVIDER_SITE_OTHER): Payer: Medicare Other | Admitting: Psychology

## 2013-02-15 DIAGNOSIS — F4323 Adjustment disorder with mixed anxiety and depressed mood: Secondary | ICD-10-CM | POA: Diagnosis not present

## 2013-02-22 ENCOUNTER — Ambulatory Visit (INDEPENDENT_AMBULATORY_CARE_PROVIDER_SITE_OTHER): Payer: Medicare Other | Admitting: Psychology

## 2013-02-22 DIAGNOSIS — F4323 Adjustment disorder with mixed anxiety and depressed mood: Secondary | ICD-10-CM

## 2013-02-25 HISTORY — PX: RADIOFREQUENCY ABLATION: SHX2290

## 2013-03-01 ENCOUNTER — Ambulatory Visit: Payer: Medicare Other | Admitting: Psychology

## 2013-03-08 ENCOUNTER — Ambulatory Visit (INDEPENDENT_AMBULATORY_CARE_PROVIDER_SITE_OTHER): Payer: Medicare Other | Admitting: Psychology

## 2013-03-08 DIAGNOSIS — F4323 Adjustment disorder with mixed anxiety and depressed mood: Secondary | ICD-10-CM | POA: Diagnosis not present

## 2013-03-27 DIAGNOSIS — J069 Acute upper respiratory infection, unspecified: Secondary | ICD-10-CM | POA: Diagnosis not present

## 2013-03-30 DIAGNOSIS — I8310 Varicose veins of unspecified lower extremity with inflammation: Secondary | ICD-10-CM | POA: Diagnosis not present

## 2013-03-30 DIAGNOSIS — L408 Other psoriasis: Secondary | ICD-10-CM | POA: Diagnosis not present

## 2013-03-30 DIAGNOSIS — Z85828 Personal history of other malignant neoplasm of skin: Secondary | ICD-10-CM | POA: Diagnosis not present

## 2013-03-30 DIAGNOSIS — L820 Inflamed seborrheic keratosis: Secondary | ICD-10-CM | POA: Diagnosis not present

## 2013-04-05 ENCOUNTER — Ambulatory Visit: Payer: Medicare Other | Admitting: Psychology

## 2013-04-11 DIAGNOSIS — I872 Venous insufficiency (chronic) (peripheral): Secondary | ICD-10-CM | POA: Diagnosis not present

## 2013-04-11 DIAGNOSIS — E039 Hypothyroidism, unspecified: Secondary | ICD-10-CM | POA: Diagnosis not present

## 2013-04-11 DIAGNOSIS — L259 Unspecified contact dermatitis, unspecified cause: Secondary | ICD-10-CM | POA: Diagnosis not present

## 2013-04-11 DIAGNOSIS — I83893 Varicose veins of bilateral lower extremities with other complications: Secondary | ICD-10-CM | POA: Diagnosis not present

## 2013-04-11 DIAGNOSIS — I2510 Atherosclerotic heart disease of native coronary artery without angina pectoris: Secondary | ICD-10-CM | POA: Diagnosis not present

## 2013-04-11 DIAGNOSIS — Z9889 Other specified postprocedural states: Secondary | ICD-10-CM | POA: Diagnosis not present

## 2013-04-17 DIAGNOSIS — I872 Venous insufficiency (chronic) (peripheral): Secondary | ICD-10-CM | POA: Diagnosis not present

## 2013-04-17 DIAGNOSIS — Z9889 Other specified postprocedural states: Secondary | ICD-10-CM | POA: Diagnosis not present

## 2013-04-17 DIAGNOSIS — I8310 Varicose veins of unspecified lower extremity with inflammation: Secondary | ICD-10-CM | POA: Diagnosis not present

## 2013-04-19 ENCOUNTER — Ambulatory Visit: Payer: Medicare Other | Admitting: Psychology

## 2013-05-03 ENCOUNTER — Ambulatory Visit: Payer: Medicare Other | Admitting: Psychology

## 2013-05-10 ENCOUNTER — Ambulatory Visit (INDEPENDENT_AMBULATORY_CARE_PROVIDER_SITE_OTHER): Payer: Medicare Other | Admitting: Psychology

## 2013-05-10 DIAGNOSIS — F4323 Adjustment disorder with mixed anxiety and depressed mood: Secondary | ICD-10-CM | POA: Diagnosis not present

## 2013-05-31 ENCOUNTER — Ambulatory Visit (INDEPENDENT_AMBULATORY_CARE_PROVIDER_SITE_OTHER): Payer: Medicare Other | Admitting: Psychology

## 2013-05-31 DIAGNOSIS — F4323 Adjustment disorder with mixed anxiety and depressed mood: Secondary | ICD-10-CM | POA: Diagnosis not present

## 2013-06-14 ENCOUNTER — Ambulatory Visit: Payer: Medicare Other | Admitting: Psychology

## 2013-06-17 ENCOUNTER — Other Ambulatory Visit: Payer: Self-pay | Admitting: Family Medicine

## 2013-06-28 ENCOUNTER — Ambulatory Visit: Payer: Medicare Other | Admitting: Psychology

## 2013-07-13 DIAGNOSIS — I8310 Varicose veins of unspecified lower extremity with inflammation: Secondary | ICD-10-CM | POA: Diagnosis not present

## 2013-07-13 DIAGNOSIS — L259 Unspecified contact dermatitis, unspecified cause: Secondary | ICD-10-CM | POA: Diagnosis not present

## 2013-07-13 DIAGNOSIS — I739 Peripheral vascular disease, unspecified: Secondary | ICD-10-CM | POA: Diagnosis not present

## 2013-07-13 DIAGNOSIS — Z9889 Other specified postprocedural states: Secondary | ICD-10-CM | POA: Diagnosis not present

## 2013-07-13 DIAGNOSIS — E039 Hypothyroidism, unspecified: Secondary | ICD-10-CM | POA: Diagnosis not present

## 2013-07-13 DIAGNOSIS — I872 Venous insufficiency (chronic) (peripheral): Secondary | ICD-10-CM | POA: Diagnosis not present

## 2013-07-13 DIAGNOSIS — I83893 Varicose veins of bilateral lower extremities with other complications: Secondary | ICD-10-CM | POA: Diagnosis not present

## 2013-07-18 ENCOUNTER — Other Ambulatory Visit: Payer: Self-pay | Admitting: Family Medicine

## 2013-07-21 DIAGNOSIS — H4011X0 Primary open-angle glaucoma, stage unspecified: Secondary | ICD-10-CM | POA: Diagnosis not present

## 2013-07-25 DIAGNOSIS — H4011X0 Primary open-angle glaucoma, stage unspecified: Secondary | ICD-10-CM | POA: Diagnosis not present

## 2013-07-26 DIAGNOSIS — Z85828 Personal history of other malignant neoplasm of skin: Secondary | ICD-10-CM | POA: Diagnosis not present

## 2013-07-26 DIAGNOSIS — L408 Other psoriasis: Secondary | ICD-10-CM | POA: Diagnosis not present

## 2013-07-26 DIAGNOSIS — R079 Chest pain, unspecified: Secondary | ICD-10-CM | POA: Diagnosis not present

## 2013-07-26 DIAGNOSIS — L821 Other seborrheic keratosis: Secondary | ICD-10-CM | POA: Diagnosis not present

## 2013-07-26 DIAGNOSIS — I8310 Varicose veins of unspecified lower extremity with inflammation: Secondary | ICD-10-CM | POA: Diagnosis not present

## 2013-07-27 DIAGNOSIS — R079 Chest pain, unspecified: Secondary | ICD-10-CM | POA: Diagnosis not present

## 2013-07-27 DIAGNOSIS — R0789 Other chest pain: Secondary | ICD-10-CM | POA: Diagnosis not present

## 2013-07-27 DIAGNOSIS — I10 Essential (primary) hypertension: Secondary | ICD-10-CM | POA: Diagnosis not present

## 2013-07-27 DIAGNOSIS — E669 Obesity, unspecified: Secondary | ICD-10-CM | POA: Diagnosis not present

## 2013-07-27 DIAGNOSIS — R6889 Other general symptoms and signs: Secondary | ICD-10-CM | POA: Diagnosis not present

## 2013-07-27 DIAGNOSIS — M810 Age-related osteoporosis without current pathological fracture: Secondary | ICD-10-CM | POA: Diagnosis not present

## 2013-07-27 DIAGNOSIS — D649 Anemia, unspecified: Secondary | ICD-10-CM | POA: Diagnosis not present

## 2013-07-27 DIAGNOSIS — M1710 Unilateral primary osteoarthritis, unspecified knee: Secondary | ICD-10-CM | POA: Diagnosis not present

## 2013-07-27 DIAGNOSIS — E039 Hypothyroidism, unspecified: Secondary | ICD-10-CM | POA: Diagnosis not present

## 2013-07-28 DIAGNOSIS — R079 Chest pain, unspecified: Secondary | ICD-10-CM | POA: Diagnosis not present

## 2013-07-28 DIAGNOSIS — M810 Age-related osteoporosis without current pathological fracture: Secondary | ICD-10-CM | POA: Diagnosis not present

## 2013-08-01 DIAGNOSIS — R079 Chest pain, unspecified: Secondary | ICD-10-CM | POA: Diagnosis not present

## 2013-08-01 DIAGNOSIS — R609 Edema, unspecified: Secondary | ICD-10-CM | POA: Diagnosis not present

## 2013-08-02 ENCOUNTER — Ambulatory Visit: Payer: BC Managed Care – PPO | Admitting: Psychology

## 2013-08-07 ENCOUNTER — Telehealth: Payer: Self-pay

## 2013-08-07 MED ORDER — CELECOXIB 100 MG PO CAPS: 100.0000 mg | ORAL_CAPSULE | Freq: Two times a day (BID) | ORAL | Status: DC | PRN

## 2013-08-07 NOTE — Telephone Encounter (Signed)
Lab Results  Component Value Date   CREATININE 0.6 05/17/2012  sent in.  plz notify pt. Past Medical History  Diagnosis Date  . Osteoarthritis     back pain, L knee pain  . Osteopenia 06/2009    DEXA T score Spine: -1.8, hip -2.0, on longterm bisphosphonate  . Hypothyroid   . History of chicken pox   . Glaucoma(365)   . Chronic venous insufficiency     with varicose veins  . History  of basal cell carcinoma     left nare

## 2013-08-07 NOTE — Telephone Encounter (Signed)
Pt presently in [place] due to funeral; pt will be returning to [place] end of this week; pt has a heel spur that causes pt pain when walking and pt has stressed her knee also; pt having knee pain with swelling. Pt said years ago Dr Sharen Hones gave her Celebrex for inflammation and request refill of Celebrex (not on current or hx med list). When pt gets call back she will have name and contact # for pharmacy in [place].Please advise.

## 2013-08-07 NOTE — Telephone Encounter (Signed)
Patient notified

## 2013-08-07 NOTE — Telephone Encounter (Signed)
Pt called back and request med to go to [facility]. Pt said pain level was 9 but stayed in bed 08/06/13 and pain is better but today she is limping today due to pain in knee and heel;pain level now is 7. Pt request cb.

## 2013-08-08 ENCOUNTER — Ambulatory Visit: Payer: BC Managed Care – PPO | Admitting: Psychology

## 2013-08-16 ENCOUNTER — Ambulatory Visit (INDEPENDENT_AMBULATORY_CARE_PROVIDER_SITE_OTHER): Payer: Medicare Other | Admitting: Psychology

## 2013-08-16 DIAGNOSIS — F4323 Adjustment disorder with mixed anxiety and depressed mood: Secondary | ICD-10-CM

## 2013-08-17 ENCOUNTER — Encounter: Payer: Self-pay | Admitting: Family Medicine

## 2013-08-17 ENCOUNTER — Ambulatory Visit (INDEPENDENT_AMBULATORY_CARE_PROVIDER_SITE_OTHER): Payer: Medicare Other | Admitting: Family Medicine

## 2013-08-17 ENCOUNTER — Telehealth: Payer: Self-pay | Admitting: Family Medicine

## 2013-08-17 VITALS — BP 132/82 | HR 77 | Temp 98.7°F | Wt 165.5 lb

## 2013-08-17 DIAGNOSIS — M7752 Other enthesopathy of left foot: Secondary | ICD-10-CM | POA: Insufficient documentation

## 2013-08-17 DIAGNOSIS — M7750 Other enthesopathy of unspecified foot: Secondary | ICD-10-CM | POA: Diagnosis not present

## 2013-08-17 DIAGNOSIS — M1712 Unilateral primary osteoarthritis, left knee: Secondary | ICD-10-CM | POA: Insufficient documentation

## 2013-08-17 DIAGNOSIS — M25562 Pain in left knee: Secondary | ICD-10-CM | POA: Insufficient documentation

## 2013-08-17 DIAGNOSIS — M25569 Pain in unspecified knee: Secondary | ICD-10-CM | POA: Insufficient documentation

## 2013-08-17 NOTE — Assessment & Plan Note (Signed)
Already on celebrex 100mg  bid. Continue. I also recommended ice/heat to areas (knee, achilles). Pt desires physical therapy for calcaneal bursitis as well as knee pain - will place order today. Will suggest heel pad to wear as well - I will ask her to return for placement of heel pads in shoes.

## 2013-08-17 NOTE — Assessment & Plan Note (Addendum)
In h/o knee OA. Left sided - anticipate strain from favoring L foot 2/2 calcaneal bursitis for last 2 months. Given effusion and patellar swelling, ?prepatellar bursitis. Continue celebrex 100mg  bid as well as ice to knee, rest and elevation. Will try and improve heel bursitis to relieve stress on knee. Refer to PT for L knee pain - likely knee strain

## 2013-08-17 NOTE — Telephone Encounter (Signed)
Can we call pt to come in to pick up some heel cushions to try - forgot to provide at office visit yesterday. Thanks.

## 2013-08-17 NOTE — Progress Notes (Signed)
  Subjective:    Patient ID: Beth Rodgers, female    DOB: [birth year], 78 y.o.   MRN: [id number]  HPI CC: L knee and heel pain  Increased walking recently on trip to [place].  Thinks has strained calcaneus on left as well as left knee.  This has been going on for last 2 months.  Pain medial to achilles.  Wonders if has had heel spur.  No recent xrays.  Did have one fall in [place], but she had L leg pain even prior to this.  Occasional locking of L knee.  Some instability as well.  Improving with time.    Last month started taking celebrex 100mg  bid prn with food (taking regularly).  Recent ER visit at [facility] 2/2 chest pain - had normal stress test.  Past Medical History  Diagnosis Date  . Osteoarthritis     back pain, L knee pain  . Osteopenia 06/2009    DEXA T score Spine: -1.8, hip -2.0, on longterm bisphosphonate  . Hypothyroid   . History of chicken pox   . Glaucoma   . Chronic venous insufficiency     with varicose veins  . History  of basal cell carcinoma     left nare    Past Surgical History  Procedure Laterality Date  . Cataract extraction      bilateral  . Vein ligation and stripping  remote    bilateral G saphenous veins  . Nasal reconstruction  2005    for BCC L nare s/p Mohs  . Breast biopsy    . Radiofrequency ablation  01/2012    remnant L G saphenous vein below knee  . Dexa  06/2009    T score Spine: -1.8, hip -2.0  . Nuclear stress test  2014    normal in [place]    Review of Systems Per HPI    Objective:   Physical Exam  Nursing note and vitals reviewed. Constitutional: She appears well-developed and well-nourished. No distress.  Musculoskeletal: She exhibits edema (tr pedal edema).  L knee- Effusion/swelling present at and superficial to patella but no erythema or warmth or significant tenderness with palpation of swelling. Tender to palpation at patella and medial to patella. + PF grind No popliteal fullness.  No calf  fullness/tenderness Neg drawer, neg mcmurray, no abnormal patellar mobility  L foot -  Swelling and point tenderness at calcaneal bursa - medial more than lateral. Able to plantar and dorsiflex against resistance. No pain at lat or med malleolus.  No pain at heel       Assessment & Plan:

## 2013-08-17 NOTE — Patient Instructions (Signed)
I think you have calcaneal bursitis as well as knee strain and bursitis from favoring that foot. Treat with continued celebrex as well as ice/heat to both areas (whichever soothes leg better). Stretching exercises provided today. Let us know if not improving with this for referral for physical therapy. Gusto verla hoy, llamenos con preguntas.

## 2013-08-18 NOTE — Telephone Encounter (Signed)
Pt notified & will try to pick them up today or Monday

## 2013-08-18 NOTE — Telephone Encounter (Signed)
Heel cushion at [employer]

## 2013-08-21 ENCOUNTER — Encounter: Payer: Self-pay | Admitting: Family Medicine

## 2013-08-21 DIAGNOSIS — IMO0001 Reserved for inherently not codable concepts without codable children: Secondary | ICD-10-CM | POA: Diagnosis not present

## 2013-08-21 DIAGNOSIS — M6281 Muscle weakness (generalized): Secondary | ICD-10-CM | POA: Diagnosis not present

## 2013-08-21 DIAGNOSIS — M25569 Pain in unspecified knee: Secondary | ICD-10-CM | POA: Diagnosis not present

## 2013-08-21 DIAGNOSIS — R269 Unspecified abnormalities of gait and mobility: Secondary | ICD-10-CM | POA: Diagnosis not present

## 2013-08-21 NOTE — Telephone Encounter (Signed)
pts husband notified heel cushions are ready for pick up at [facility] desk.

## 2013-08-24 ENCOUNTER — Other Ambulatory Visit: Payer: Self-pay | Admitting: Family Medicine

## 2013-08-24 DIAGNOSIS — M8580 Other specified disorders of bone density and structure, unspecified site: Secondary | ICD-10-CM

## 2013-08-24 DIAGNOSIS — E039 Hypothyroidism, unspecified: Secondary | ICD-10-CM

## 2013-08-24 DIAGNOSIS — I872 Venous insufficiency (chronic) (peripheral): Secondary | ICD-10-CM

## 2013-08-24 DIAGNOSIS — E785 Hyperlipidemia, unspecified: Secondary | ICD-10-CM

## 2013-08-24 NOTE — Telephone Encounter (Signed)
Spoke with patient. She had forgotten to pick up heel cushions. She will come by later on today or this week to pick them up.

## 2013-08-28 ENCOUNTER — Encounter: Payer: Self-pay | Admitting: Family Medicine

## 2013-08-28 DIAGNOSIS — IMO0001 Reserved for inherently not codable concepts without codable children: Secondary | ICD-10-CM | POA: Diagnosis not present

## 2013-08-28 DIAGNOSIS — R269 Unspecified abnormalities of gait and mobility: Secondary | ICD-10-CM | POA: Diagnosis not present

## 2013-08-28 DIAGNOSIS — M25569 Pain in unspecified knee: Secondary | ICD-10-CM | POA: Diagnosis not present

## 2013-08-28 DIAGNOSIS — M6281 Muscle weakness (generalized): Secondary | ICD-10-CM | POA: Diagnosis not present

## 2013-08-28 DIAGNOSIS — M7750 Other enthesopathy of unspecified foot: Secondary | ICD-10-CM | POA: Diagnosis not present

## 2013-08-31 ENCOUNTER — Other Ambulatory Visit (INDEPENDENT_AMBULATORY_CARE_PROVIDER_SITE_OTHER): Payer: Medicare Other

## 2013-08-31 DIAGNOSIS — E785 Hyperlipidemia, unspecified: Secondary | ICD-10-CM

## 2013-08-31 DIAGNOSIS — M899 Disorder of bone, unspecified: Secondary | ICD-10-CM

## 2013-08-31 DIAGNOSIS — E039 Hypothyroidism, unspecified: Secondary | ICD-10-CM

## 2013-08-31 DIAGNOSIS — M8580 Other specified disorders of bone density and structure, unspecified site: Secondary | ICD-10-CM

## 2013-08-31 LAB — LDL CHOLESTEROL, DIRECT: Direct LDL: 124.9 mg/dL

## 2013-08-31 LAB — TSH: TSH: 0.73 u[IU]/mL (ref 0.35–5.50)

## 2013-08-31 LAB — BASIC METABOLIC PANEL
CO2: 29 mEq/L (ref 19–32)
Calcium: 8.8 mg/dL (ref 8.4–10.5)
Glucose, Bld: 84 mg/dL (ref 70–99)
Sodium: 140 mEq/L (ref 135–145)

## 2013-08-31 LAB — LIPID PANEL
HDL: 67.2 mg/dL (ref >39.00)
Triglycerides: 41 mg/dL (ref 0.0–149.0)

## 2013-09-01 LAB — VITAMIN D 25 HYDROXY (VIT D DEFICIENCY, FRACTURES): Vit D, 25-Hydroxy: 41 ng/mL (ref 30–89)

## 2013-09-05 ENCOUNTER — Ambulatory Visit (INDEPENDENT_AMBULATORY_CARE_PROVIDER_SITE_OTHER): Payer: Medicare Other | Admitting: Psychology

## 2013-09-05 DIAGNOSIS — F4323 Adjustment disorder with mixed anxiety and depressed mood: Secondary | ICD-10-CM

## 2013-09-06 ENCOUNTER — Ambulatory Visit (INDEPENDENT_AMBULATORY_CARE_PROVIDER_SITE_OTHER): Payer: Medicare Other | Admitting: Family Medicine

## 2013-09-06 ENCOUNTER — Encounter: Payer: Self-pay | Admitting: Family Medicine

## 2013-09-06 VITALS — BP 128/84 | HR 64 | Temp 98.2°F | Ht 66.0 in | Wt 162.5 lb

## 2013-09-06 DIAGNOSIS — Z1211 Encounter for screening for malignant neoplasm of colon: Secondary | ICD-10-CM | POA: Diagnosis not present

## 2013-09-06 DIAGNOSIS — Z23 Encounter for immunization: Secondary | ICD-10-CM

## 2013-09-06 DIAGNOSIS — Z0000 Encounter for general adult medical examination without abnormal findings: Secondary | ICD-10-CM

## 2013-09-06 DIAGNOSIS — M7750 Other enthesopathy of unspecified foot: Secondary | ICD-10-CM

## 2013-09-06 DIAGNOSIS — Z1231 Encounter for screening mammogram for malignant neoplasm of breast: Secondary | ICD-10-CM | POA: Diagnosis not present

## 2013-09-06 DIAGNOSIS — M8580 Other specified disorders of bone density and structure, unspecified site: Secondary | ICD-10-CM

## 2013-09-06 DIAGNOSIS — M899 Disorder of bone, unspecified: Secondary | ICD-10-CM

## 2013-09-06 DIAGNOSIS — E039 Hypothyroidism, unspecified: Secondary | ICD-10-CM

## 2013-09-06 DIAGNOSIS — M7752 Other enthesopathy of left foot: Secondary | ICD-10-CM

## 2013-09-06 NOTE — Addendum Note (Signed)
Addended by: Josph Macho A on: 09/06/2013 12:56 PM   Modules accepted: Orders

## 2013-09-06 NOTE — Progress Notes (Signed)
Subjective:    Patient ID: Beth Rodgers, female    DOB: 1935-07-16, 78 y.o.   MRN: [id number]  HPI CC: medicare wellness  Wt Readings from Last 3 Encounters:  09/06/13 162 lb 8 oz (73.71 kg)  08/17/13 165 lb 8 oz (75.07 kg)  11/15/12 170 lb (77.111 kg)  joined TOPS group (taking off pounds sensibly). Body mass index is 26.24 kg/(m^2).   Preventative:  Mammogram at [facility] 06/13/2012  Declines colonoscopy, would like yearly stool kit.  Pelvic exam, breast exam 03/2012 with Dr. Marice Potter. Will shchedule f/u. Td 04/2012  Pneumovax 10/2012 No shingles shot  DEXA - 2010, osteopenia Advanced directives: has not thought about this. Would like packet with information.   Passes hearing and vision screen Denies depression, anhedonia  1 fall, no injury - tripped over hotel rug.  Caffeine: 2-3 cups/day  Lives with husband, majority of time alone as he travels to [place].  Has grandchildren nearby.  Occ: Psychologist, educational, Wellsite geologist  Activity: bed exercises, limiting walking  Diet: does get fruits and vegetables, only some water  Medications and allergies reviewed and updated in chart.  Past histories reviewed and updated if relevant as below. Patient Active Problem List   Diagnosis Date Noted  . Knee pain 08/17/2013  . Left calcaneal bursitis 08/17/2013  . Oropharyngeal dysphagia 11/15/2012  . Unspecified family circumstance 03/30/2012  . Medicare annual wellness visit, initial 03/30/2012  . History  of basal cell carcinoma   . Osteoarthritis   . Osteopenia   . Hypothyroid   . Glaucoma   . Chronic venous insufficiency    Past Medical History  Diagnosis Date  . Osteoarthritis     back pain, L knee pain  . Osteopenia 06/2009    DEXA T score Spine: -1.8, hip -2.0, on longterm bisphosphonate  . Hypothyroid   . History of chicken pox   . Glaucoma   . Chronic venous insufficiency     with varicose veins  . History  of basal cell carcinoma     left nare   Past Surgical History   Procedure Laterality Date  . Cataract extraction      bilateral  . Vein ligation and stripping  remote    bilateral G saphenous veins  . Nasal reconstruction  2005    for BCC L nare s/p Mohs  . Breast biopsy    . Radiofrequency ablation Left 01/2012    remnant L G saphenous vein below knee  . Dexa  06/2009    T score Spine: -1.8, hip -2.0  . Nuclear stress test  2014    normal in [place]  . Radiofrequency ablation Right 02/2013    RLE vein ablation    History  Substance Use Topics  . Smoking status: Never Smoker   . Smokeless tobacco: Never Used  . Alcohol Use: Yes     Comment: Occasional   Family History  Problem Relation Age of Onset  . Cancer Mother     thyroid cancer  . Heart disease Father     CHF  . Diabetes Father   . Glaucoma Father   . Arthritis Father   . Coronary artery disease Maternal Uncle   . Stroke Neg Hx   . Bipolar disorder Son    No Known Allergies Current Outpatient Prescriptions on File Prior to Visit  Medication Sig Dispense Refill  . alendronate (FOSAMAX) 70 MG tablet Take 1 tablet (70 mg total) by mouth every 7 (seven) days. Take with a  full glass of water on an empty stomach.  12 tablet  3  . betamethasone, augmented, (DIPROLENE) 0.05 % lotion Apply 1 application topically 2 (two) times daily.        . calcipotriene-betamethasone (TACLONEX) ointment Apply 1 application topically daily.        . celecoxib (CELEBREX) 100 MG capsule Take 1 capsule (100 mg total) by mouth 2 (two) times daily as needed for pain.  60 capsule  0  . CICLOPIROX EX Apply topically as needed.      Marland Kitchen EVISTA 60 MG tablet TAKE 1 TABLET BY MOUTH ONCE A DAY  30 tablet  11  . SYNTHROID 75 MCG tablet Take 1 tablet daily **Needs follow up for further refills**  60 tablet  0  . travoprost, benzalkonium, (TRAVATAN) 0.004 % ophthalmic solution Place 1 drop into the left eye at bedtime.        . Ascorbic Acid (VITAMIN C CR PO) Take by mouth as directed.        Marland Kitchen CALCIUM CARBONATE  PO Take 600 mg by mouth as directed.       . Homeopathic Products (ARNICARE) GEL Apply 1 application topically as directed.        . hydrocortisone (PREPARATION H HYDROCORTISONE) 1 % cream Apply 1 application topically 2 (two) times daily.        Marland Kitchen neomycin-bacitracin-polymyxin (NEOSPORIN) ointment Apply 1 application topically every 12 (twelve) hours. apply to eye       . tacrolimus (PROTOPIC) 0.1 % ointment Apply 1 application topically 2 (two) times daily.        Marland Kitchen VITAMIN D, CHOLECALCIFEROL, PO Take 1,000 Units by mouth as directed.        No current facility-administered medications on file prior to visit.     Review of Systems  Constitutional: Negative for fever, chills, activity change, appetite change, fatigue and unexpected weight change.  HENT: Negative for hearing loss and neck pain.   Eyes: Negative for visual disturbance.  Respiratory: Negative for cough, chest tightness, shortness of breath and wheezing.   Cardiovascular: Negative for chest pain, palpitations and leg swelling.  Gastrointestinal: Negative for nausea, vomiting, abdominal pain, diarrhea, constipation, blood in stool and abdominal distention.  Genitourinary: Negative for hematuria and difficulty urinating.  Musculoskeletal: Negative for myalgias and arthralgias.  Skin: Negative for rash.  Neurological: Negative for dizziness, seizures, syncope and headaches.  Hematological: Does not bruise/bleed easily.  Psychiatric/Behavioral: Negative for dysphoric mood. The patient is not nervous/anxious.        Objective:   Physical Exam  Nursing note and vitals reviewed. Constitutional: She is oriented to person, place, and time. She appears well-developed and well-nourished. No distress.  HENT:  Head: Normocephalic and atraumatic.  Right Ear: Hearing, tympanic membrane, external ear and ear canal normal.  Left Ear: Hearing, tympanic membrane, external ear and ear canal normal.  Nose: Nose normal.  Mouth/Throat:  Oropharynx is clear and moist. No oropharyngeal exudate.  Eyes: Conjunctivae and EOM are normal. Pupils are equal, round, and reactive to light. No scleral icterus.  Neck: Normal range of motion. Neck supple. Carotid bruit is not present. No thyromegaly present.  Cardiovascular: Normal rate, regular rhythm, normal heart sounds and intact distal pulses.   No murmur heard. Pulses:      Radial pulses are 2+ on the right side, and 2+ on the left side.  Pulmonary/Chest: Effort normal and breath sounds normal. No respiratory distress. She has no wheezes. She has no rales.  Abdominal:  Soft. Bowel sounds are normal. She exhibits no distension and no mass. There is no tenderness. There is no rebound and no guarding.  Musculoskeletal: Normal range of motion. She exhibits no edema.  compression stockings in place  Lymphadenopathy:    She has no cervical adenopathy.  Neurological: She is alert and oriented to person, place, and time.  CN grossly intact, station and gait intact  Skin: Skin is warm and dry. No rash noted.  Verrucous exophytic growth lateral left upper arm  Psychiatric: She has a normal mood and affect. Her behavior is normal. Judgment and thought content normal.       Assessment & Plan:

## 2013-09-06 NOTE — Patient Instructions (Addendum)
Flu shot today. Stool kit today. Check to see if you are due for mammogram and schedule if needed - ask them to send [place] copy of mammogram.  I've placed referral today. Schedule appt with Dr. Marice Potter for well woman exam. Advanced directive packet provided today. Gusto verla hoy.

## 2013-09-06 NOTE — Assessment & Plan Note (Signed)
Has been on bisphosphonate for >5 yrs. Consider stopping next year and rpt dexa next year. Discussed:  Try to get most or all of your calcium from your food--aim for 1000 mg/day for women up to 50 and men up to 70 and 1200 mg/day for women over 50 and men over 70. To figure out dietary calcium: 300 mg/day from all non dairy foods plus 300 mg per cup of milk, other dairy, or fortified juice.

## 2013-09-06 NOTE — Assessment & Plan Note (Signed)
I have personally reviewed the Medicare Annual Wellness questionnaire and have noted 1. The patient's medical and social history 2. Their use of alcohol, tobacco or illicit drugs 3. Their current medications and supplements 4. The patient's functional ability including ADL's, fall risks, home safety risks and hearing or visual impairment. 5. Diet and physical activity 6. Evidence for depression or mood disorders The patients weight, height, BMI have been recorded in the chart.  Hearing and vision has been addressed. I have made referrals, counseling and provided education to the patient based review of the above and I have provided the pt with a written personalized care plan for preventive services. See scanned questionairre. Advanced directives discussed: provided with packet in spanish per pt request.  Reviewed preventative protocols and updated unless pt declined. Flu shot today. iFOB today. Well woman with OBGYN To schedule mammogram at [facility] - referral placed today.

## 2013-09-06 NOTE — Assessment & Plan Note (Signed)
Improving with PT

## 2013-09-06 NOTE — Assessment & Plan Note (Signed)
TSH stable.  

## 2013-09-13 ENCOUNTER — Ambulatory Visit (INDEPENDENT_AMBULATORY_CARE_PROVIDER_SITE_OTHER): Payer: Medicare Other | Admitting: Psychology

## 2013-09-13 DIAGNOSIS — F4323 Adjustment disorder with mixed anxiety and depressed mood: Secondary | ICD-10-CM | POA: Diagnosis not present

## 2013-09-19 ENCOUNTER — Telehealth: Payer: Self-pay

## 2013-09-19 DIAGNOSIS — L989 Disorder of the skin and subcutaneous tissue, unspecified: Secondary | ICD-10-CM

## 2013-09-19 NOTE — Telephone Encounter (Signed)
Referral placed.

## 2013-09-19 NOTE — Telephone Encounter (Signed)
Pt left v/m requesting dermatology consult for a wart; called pt back to get more info and left message with pts husband to call office.

## 2013-09-19 NOTE — Telephone Encounter (Signed)
Pt called back; wart or mole is on upper lt arm for 1 month. Pt said she is aware wart or mole is there because it is sensitive and itchy. Pt said wart is growing and feels rough to touch.Pt wants to see dermatologist in [place].Please advise.

## 2013-09-21 ENCOUNTER — Other Ambulatory Visit: Payer: Self-pay | Admitting: Family Medicine

## 2013-09-27 ENCOUNTER — Ambulatory Visit (INDEPENDENT_AMBULATORY_CARE_PROVIDER_SITE_OTHER): Payer: Medicare Other | Admitting: Psychology

## 2013-09-27 ENCOUNTER — Encounter: Payer: Self-pay | Admitting: Family Medicine

## 2013-09-27 ENCOUNTER — Other Ambulatory Visit: Payer: Self-pay | Admitting: Family Medicine

## 2013-09-27 DIAGNOSIS — F4323 Adjustment disorder with mixed anxiety and depressed mood: Secondary | ICD-10-CM

## 2013-09-27 DIAGNOSIS — IMO0001 Reserved for inherently not codable concepts without codable children: Secondary | ICD-10-CM | POA: Diagnosis not present

## 2013-09-27 DIAGNOSIS — M6281 Muscle weakness (generalized): Secondary | ICD-10-CM | POA: Diagnosis not present

## 2013-09-27 DIAGNOSIS — M7750 Other enthesopathy of unspecified foot: Secondary | ICD-10-CM | POA: Diagnosis not present

## 2013-09-27 DIAGNOSIS — M25562 Pain in left knee: Secondary | ICD-10-CM

## 2013-09-27 DIAGNOSIS — M25569 Pain in unspecified knee: Secondary | ICD-10-CM | POA: Diagnosis not present

## 2013-09-27 DIAGNOSIS — R269 Unspecified abnormalities of gait and mobility: Secondary | ICD-10-CM | POA: Diagnosis not present

## 2013-10-04 ENCOUNTER — Ambulatory Visit (INDEPENDENT_AMBULATORY_CARE_PROVIDER_SITE_OTHER): Payer: Medicare Other | Admitting: Psychology

## 2013-10-04 ENCOUNTER — Telehealth: Payer: Self-pay | Admitting: Family Medicine

## 2013-10-04 DIAGNOSIS — F4323 Adjustment disorder with mixed anxiety and depressed mood: Secondary | ICD-10-CM | POA: Diagnosis not present

## 2013-10-04 NOTE — Telephone Encounter (Signed)
The patient joined a weight loss program and they are requiring her to have a "goal" weight.  She is hoping she can speak to the nurse to discuss this - [phone]   Thanks!

## 2013-10-04 NOTE — Telephone Encounter (Signed)
Healthy weight for her height would be around 150 lbs. Wt Readings from Last 3 Encounters:  09/06/13 162 lb 8 oz (73.71 kg)  08/17/13 165 lb 8 oz (75.07 kg)  11/15/12 170 lb (77.111 kg)

## 2013-10-04 NOTE — Telephone Encounter (Signed)
Dr. Reece Agar will have to address this. Please advise healthy goal weight for patient so I can notify her. Thanks!

## 2013-10-05 ENCOUNTER — Encounter: Payer: Self-pay | Admitting: *Deleted

## 2013-10-05 NOTE — Telephone Encounter (Signed)
Patient notified and asked that I mail a letter advising this. Letter mailed.

## 2013-10-09 ENCOUNTER — Ambulatory Visit: Payer: Self-pay | Admitting: Family Medicine

## 2013-10-09 ENCOUNTER — Encounter: Payer: Self-pay | Admitting: Obstetrics & Gynecology

## 2013-10-09 ENCOUNTER — Ambulatory Visit (INDEPENDENT_AMBULATORY_CARE_PROVIDER_SITE_OTHER): Payer: Medicare Other | Admitting: Obstetrics & Gynecology

## 2013-10-09 VITALS — BP 169/98 | HR 77 | Ht 66.0 in | Wt 161.0 lb

## 2013-10-09 DIAGNOSIS — Z01419 Encounter for gynecological examination (general) (routine) without abnormal findings: Secondary | ICD-10-CM

## 2013-10-09 DIAGNOSIS — Z124 Encounter for screening for malignant neoplasm of cervix: Secondary | ICD-10-CM

## 2013-10-09 DIAGNOSIS — Z0000 Encounter for general adult medical examination without abnormal findings: Secondary | ICD-10-CM

## 2013-10-09 NOTE — Patient Instructions (Signed)

## 2013-10-09 NOTE — Progress Notes (Signed)
Subjective:    Beth Rodgers is a 78 y.o. female who presents for an annual exam. The patient has no complaints today.  The patient is not currently sexually active. Her husband is 31 yo and works in Research officer, political party in [place].  GYN screening history: last pap: was normal. The patient wears seatbelts: yes. The patient participates in regular exercise: no. Has the patient ever been transfused or tattooed?: no. The patient reports that there is not domestic violence in her life.   Menstrual History: OB History   Grav Para Term Preterm Abortions TAB SAB Ect Mult Living                  Menarche age: 54 Coitarche: 35   No LMP recorded. Patient is postmenopausal.    The following portions of the patient's history were reviewed and updated as appropriate: allergies, current medications, past family history, past medical history, past social history, past surgical history and problem list.  Review of Systems A comprehensive review of systems was negative. She is having her mammogram tomorrow. Her stool cards were last week. She has had her flu vaccine this month.   Objective:    BP 169/98  Pulse 77  Ht 5\' 6"  (1.676 m)  Wt 161 lb (73.029 kg)  BMI 26 kg/m2  General Appearance:    Alert, cooperative, no distress, appears stated age  Head:    Normocephalic, without obvious abnormality, atraumatic  Eyes:    PERRL, conjunctiva/corneas clear, EOM's intact, fundi    benign, both eyes  Ears:    Normal TM's and external ear canals, both ears  Nose:   Nares normal, septum midline, mucosa normal, no drainage    or sinus tenderness  Throat:   Lips, mucosa, and tongue normal; teeth and gums normal  Neck:   Supple, symmetrical, trachea midline, no adenopathy;    thyroid:  no enlargement/tenderness/nodules; no carotid   bruit or JVD  Back:     Symmetric, no curvature, ROM normal, no CVA tenderness  Lungs:     Clear to auscultation bilaterally, respirations unlabored  Chest Wall:    No tenderness or  deformity   Heart:    Regular rate and rhythm, S1 and S2 normal, no murmur, rub   or gallop  Breast Exam:    No tenderness, masses, or nipple abnormality  Abdomen:     Soft, non-tender, bowel sounds active all four quadrants,    no masses, no organomegaly  Genitalia:    Normal female without lesion, discharge or tenderness, severe atrophy, normal bimanual     Extremities:   Extremities normal, atraumatic, no cyanosis or edema  Pulses:   2+ and symmetric all extremities  Skin:   Skin color, texture, turgor normal, no rashes or lesions  Lymph nodes:   Cervical, supraclavicular, and axillary nodes normal  Neurologic:   CNII-XII intact, normal strength, sensation and reflexes    throughout  .    Assessment:    Healthy female exam.    Plan:     Breast self exam technique reviewed and patient encouraged to perform self-exam monthly. Thin prep Pap smear.

## 2013-10-10 ENCOUNTER — Encounter: Payer: Self-pay | Admitting: Family Medicine

## 2013-10-10 LAB — HM MAMMOGRAPHY: HM Mammogram: NORMAL

## 2013-10-11 ENCOUNTER — Encounter: Payer: Self-pay | Admitting: *Deleted

## 2013-10-11 ENCOUNTER — Other Ambulatory Visit: Payer: Medicare Other

## 2013-10-11 DIAGNOSIS — D485 Neoplasm of uncertain behavior of skin: Secondary | ICD-10-CM | POA: Diagnosis not present

## 2013-10-11 DIAGNOSIS — I8310 Varicose veins of unspecified lower extremity with inflammation: Secondary | ICD-10-CM | POA: Diagnosis not present

## 2013-10-11 DIAGNOSIS — B079 Viral wart, unspecified: Secondary | ICD-10-CM | POA: Diagnosis not present

## 2013-10-11 DIAGNOSIS — L989 Disorder of the skin and subcutaneous tissue, unspecified: Secondary | ICD-10-CM | POA: Diagnosis not present

## 2013-10-11 DIAGNOSIS — L821 Other seborrheic keratosis: Secondary | ICD-10-CM | POA: Diagnosis not present

## 2013-10-11 DIAGNOSIS — Z1211 Encounter for screening for malignant neoplasm of colon: Secondary | ICD-10-CM

## 2013-10-11 DIAGNOSIS — D1801 Hemangioma of skin and subcutaneous tissue: Secondary | ICD-10-CM | POA: Diagnosis not present

## 2013-10-12 ENCOUNTER — Encounter: Payer: Self-pay | Admitting: *Deleted

## 2013-10-12 DIAGNOSIS — M7660 Achilles tendinitis, unspecified leg: Secondary | ICD-10-CM | POA: Diagnosis not present

## 2013-10-12 DIAGNOSIS — M25569 Pain in unspecified knee: Secondary | ICD-10-CM | POA: Diagnosis not present

## 2013-10-18 ENCOUNTER — Ambulatory Visit (INDEPENDENT_AMBULATORY_CARE_PROVIDER_SITE_OTHER): Payer: Medicare Other | Admitting: Psychology

## 2013-10-18 DIAGNOSIS — F4323 Adjustment disorder with mixed anxiety and depressed mood: Secondary | ICD-10-CM

## 2013-10-28 ENCOUNTER — Encounter: Payer: Self-pay | Admitting: Family Medicine

## 2013-11-01 ENCOUNTER — Ambulatory Visit (INDEPENDENT_AMBULATORY_CARE_PROVIDER_SITE_OTHER): Payer: Medicare Other | Admitting: Psychology

## 2013-11-01 DIAGNOSIS — F4323 Adjustment disorder with mixed anxiety and depressed mood: Secondary | ICD-10-CM

## 2013-11-02 ENCOUNTER — Telehealth: Payer: Self-pay

## 2013-11-02 NOTE — Telephone Encounter (Signed)
Pt wants to know if she should get the shingles vaccine; pt will check with her insurance co to see if she has coverage and will cb to request order for shingles vaccine.

## 2013-11-15 ENCOUNTER — Ambulatory Visit (INDEPENDENT_AMBULATORY_CARE_PROVIDER_SITE_OTHER): Payer: Medicare Other | Admitting: Psychology

## 2013-11-15 DIAGNOSIS — F4323 Adjustment disorder with mixed anxiety and depressed mood: Secondary | ICD-10-CM | POA: Diagnosis not present

## 2013-11-29 ENCOUNTER — Ambulatory Visit (INDEPENDENT_AMBULATORY_CARE_PROVIDER_SITE_OTHER): Payer: Medicare Other | Admitting: Psychology

## 2013-11-29 DIAGNOSIS — F4323 Adjustment disorder with mixed anxiety and depressed mood: Secondary | ICD-10-CM

## 2013-12-13 ENCOUNTER — Ambulatory Visit: Payer: Medicare Other | Admitting: Psychology

## 2013-12-19 ENCOUNTER — Ambulatory Visit: Payer: Medicare Other | Admitting: Psychology

## 2013-12-26 ENCOUNTER — Other Ambulatory Visit: Payer: Self-pay | Admitting: Family Medicine

## 2013-12-26 NOTE — Telephone Encounter (Signed)
Request for the Fosamax last filled 04/2012 with 1 yr refills. Pt still has refills on Evista it was sent in error--confirmed with pharmacy--please advise on the refill for the Fosamax

## 2013-12-26 NOTE — Telephone Encounter (Signed)
fosamax sent in.

## 2014-01-02 ENCOUNTER — Ambulatory Visit (INDEPENDENT_AMBULATORY_CARE_PROVIDER_SITE_OTHER): Payer: Medicare Other | Admitting: Psychology

## 2014-01-02 DIAGNOSIS — F4323 Adjustment disorder with mixed anxiety and depressed mood: Secondary | ICD-10-CM

## 2014-01-10 ENCOUNTER — Ambulatory Visit (INDEPENDENT_AMBULATORY_CARE_PROVIDER_SITE_OTHER): Payer: 59 | Admitting: Psychology

## 2014-01-10 DIAGNOSIS — F4323 Adjustment disorder with mixed anxiety and depressed mood: Secondary | ICD-10-CM

## 2014-01-24 ENCOUNTER — Ambulatory Visit (INDEPENDENT_AMBULATORY_CARE_PROVIDER_SITE_OTHER): Payer: 59 | Admitting: Psychology

## 2014-01-24 DIAGNOSIS — F4323 Adjustment disorder with mixed anxiety and depressed mood: Secondary | ICD-10-CM

## 2014-02-07 ENCOUNTER — Ambulatory Visit (INDEPENDENT_AMBULATORY_CARE_PROVIDER_SITE_OTHER): Payer: 59 | Admitting: Psychology

## 2014-02-07 DIAGNOSIS — F4323 Adjustment disorder with mixed anxiety and depressed mood: Secondary | ICD-10-CM | POA: Diagnosis not present

## 2014-02-21 ENCOUNTER — Ambulatory Visit (INDEPENDENT_AMBULATORY_CARE_PROVIDER_SITE_OTHER): Payer: 59 | Admitting: Psychology

## 2014-02-21 DIAGNOSIS — F4323 Adjustment disorder with mixed anxiety and depressed mood: Secondary | ICD-10-CM

## 2014-02-23 ENCOUNTER — Telehealth: Payer: Self-pay

## 2014-02-23 MED ORDER — PNEUMOCOCCAL 13-VAL CONJ VACC IM SUSP: 0.5000 mL | Freq: Once | INTRAMUSCULAR | Status: DC

## 2014-02-23 NOTE — Telephone Encounter (Signed)
Pt left v/m; pt is traveling to see 2 family members who are hospitalized due to pneumonia; pt had pneumovax 11/15/12; pt wants to know if booster is needed prior to visiting family members.Please advise.

## 2014-02-23 NOTE — Telephone Encounter (Signed)
Sent!

## 2014-02-23 NOTE — Telephone Encounter (Signed)
Patient advised.   Patient, however, says that she would like to get the additional vaccine that is recommended for pneumonia and would like that [facility] [address].

## 2014-02-23 NOTE — Telephone Encounter (Signed)
She should be okay to go as is.  She may or may not have to wear a mask on at the hospital, but that may be due to the patients and not her.  Hope they all get better soon. Thanks.

## 2014-02-24 DIAGNOSIS — Z23 Encounter for immunization: Secondary | ICD-10-CM | POA: Diagnosis not present

## 2014-03-07 ENCOUNTER — Ambulatory Visit (INDEPENDENT_AMBULATORY_CARE_PROVIDER_SITE_OTHER): Payer: 59 | Admitting: Psychology

## 2014-03-07 DIAGNOSIS — F4323 Adjustment disorder with mixed anxiety and depressed mood: Secondary | ICD-10-CM | POA: Diagnosis not present

## 2014-03-22 DIAGNOSIS — H4011X0 Primary open-angle glaucoma, stage unspecified: Secondary | ICD-10-CM | POA: Diagnosis not present

## 2014-03-22 DIAGNOSIS — H409 Unspecified glaucoma: Secondary | ICD-10-CM | POA: Diagnosis not present

## 2014-04-11 ENCOUNTER — Ambulatory Visit (INDEPENDENT_AMBULATORY_CARE_PROVIDER_SITE_OTHER): Payer: 59 | Admitting: Psychology

## 2014-04-11 DIAGNOSIS — F4323 Adjustment disorder with mixed anxiety and depressed mood: Secondary | ICD-10-CM | POA: Diagnosis not present

## 2014-04-18 ENCOUNTER — Ambulatory Visit: Payer: Medicare Other | Admitting: Psychology

## 2014-04-25 ENCOUNTER — Ambulatory Visit (INDEPENDENT_AMBULATORY_CARE_PROVIDER_SITE_OTHER): Payer: 59 | Admitting: Psychology

## 2014-04-25 DIAGNOSIS — F4323 Adjustment disorder with mixed anxiety and depressed mood: Secondary | ICD-10-CM

## 2014-05-02 ENCOUNTER — Ambulatory Visit: Payer: Medicare Other | Admitting: Psychology

## 2014-05-16 ENCOUNTER — Ambulatory Visit (INDEPENDENT_AMBULATORY_CARE_PROVIDER_SITE_OTHER): Payer: 59 | Admitting: Psychology

## 2014-05-16 DIAGNOSIS — F4323 Adjustment disorder with mixed anxiety and depressed mood: Secondary | ICD-10-CM

## 2014-06-13 ENCOUNTER — Ambulatory Visit (INDEPENDENT_AMBULATORY_CARE_PROVIDER_SITE_OTHER): Payer: 59 | Admitting: Psychology

## 2014-06-13 DIAGNOSIS — F4323 Adjustment disorder with mixed anxiety and depressed mood: Secondary | ICD-10-CM | POA: Diagnosis not present

## 2014-06-25 ENCOUNTER — Encounter: Payer: Self-pay | Admitting: Family Medicine

## 2014-06-25 ENCOUNTER — Ambulatory Visit (INDEPENDENT_AMBULATORY_CARE_PROVIDER_SITE_OTHER): Payer: Medicare Other | Admitting: Family Medicine

## 2014-06-25 VITALS — BP 130/82 | HR 64 | Temp 98.2°F | Wt 154.5 lb

## 2014-06-25 DIAGNOSIS — M542 Cervicalgia: Secondary | ICD-10-CM | POA: Diagnosis not present

## 2014-06-25 DIAGNOSIS — R0982 Postnasal drip: Secondary | ICD-10-CM

## 2014-06-25 DIAGNOSIS — J329 Chronic sinusitis, unspecified: Secondary | ICD-10-CM

## 2014-06-25 MED ORDER — PENTOXIFYLLINE ER 400 MG PO TBCR: 400.0000 mg | EXTENDED_RELEASE_TABLET | Freq: Three times a day (TID) | ORAL | Status: DC

## 2014-06-25 MED ORDER — FLUTICASONE PROPIONATE 50 MCG/ACT NA SUSP: 2.0000 | Freq: Every day | NASAL | Status: DC

## 2014-06-25 MED ORDER — BENZONATATE 100 MG PO CAPS: 100.0000 mg | ORAL_CAPSULE | Freq: Two times a day (BID) | ORAL | Status: DC | PRN

## 2014-06-25 NOTE — Progress Notes (Signed)
Pre visit review using our clinic review tool, if applicable. No additional management support is needed unless otherwise documented below in the visit note. 

## 2014-06-25 NOTE — Progress Notes (Signed)
BP 130/82  Pulse 64  Temp(Src) 98.2 F (36.8 C) (Oral)  Wt 154 lb 8 oz (70.081 kg)   CC: neck pain, cough  Subjective:    Patient ID: Beth Rodgers, female    DOB: [birth year], 79 y.o.   MRN: [id number]  HPI: Beth Rodgers is a 79 y.o. female presenting on 06/25/2014 for Neck Pain and Cough   Just finished 10d course of ampicillin for URI. Residual phlegm and congestion and ongoing cough. No fevers/chills, abd pain, nausea. Planning upcoming trip back to [place].   Ongoing R neck pain for the past 1 week. Worse with lateral flexion to the right. So far has tried nothing for this. No headache.   Recent trip to [place] then road trip with nephew to [place].  Relevant past medical, surgical, family and social history reviewed and updated as indicated.  Allergies and medications reviewed and updated. Current Outpatient Prescriptions on File Prior to Visit  Medication Sig  . alendronate (FOSAMAX) 70 MG tablet TAKE 1 TABLET BY MOUTH EVERY 7 DAYS TAKE WITH A FULL GLASS OF WATER ON AN EMPTY STOMACH  . Ascorbic Acid (VITAMIN C CR PO) Take by mouth as directed.    . betamethasone, augmented, (DIPROLENE) 0.05 % lotion Apply 1 application topically 2 (two) times daily.    . calcipotriene-betamethasone (TACLONEX) ointment Apply 1 application topically daily.    Marland Kitchen CALCIUM CARBONATE PO Take 600 mg by mouth as directed.   . celecoxib (CELEBREX) 100 MG capsule Take 1 capsule (100 mg total) by mouth 2 (two) times daily as needed for pain.  Marland Kitchen CICLOPIROX EX Apply topically as needed.  Marland Kitchen EVISTA 60 MG tablet TAKE 1 TABLET BY MOUTH ONCE A DAY  . Homeopathic Products (ARNICARE) GEL Apply 1 application topically as directed.    Marland Kitchen levothyroxine (SYNTHROID) 75 MCG tablet Take 1 tablet daily  . NITROSTAT 0.4 MG SL tablet   . SYNTHROID 75 MCG tablet TAKE 1 TABLET BY MOUTH ONCE A DAY  . tacrolimus (PROTOPIC) 0.1 % ointment Apply 1 application topically 2 (two) times daily.    . travoprost, benzalkonium,  (TRAVATAN) 0.004 % ophthalmic solution Place 1 drop into the left eye at bedtime.    Marland Kitchen VITAMIN D, CHOLECALCIFEROL, PO Take 1,000 Units by mouth as directed.    No current facility-administered medications on file prior to visit.    Review of Systems Per HPI unless specifically indicated above    Objective:    BP 130/82  Pulse 64  Temp(Src) 98.2 F (36.8 C) (Oral)  Wt 154 lb 8 oz (70.081 kg)  Physical Exam  Nursing note and vitals reviewed. Constitutional: She appears well-developed and well-nourished. No distress.  HENT:  Head: Normocephalic and atraumatic.  Right Ear: Hearing, tympanic membrane, external ear and ear canal normal.  Left Ear: Hearing, tympanic membrane, external ear and ear canal normal.  Rodgers: Mucosal edema present. No rhinorrhea. Right sinus exhibits no maxillary sinus tenderness and no frontal sinus tenderness. Left sinus exhibits no maxillary sinus tenderness and no frontal sinus tenderness.  Mouth/Throat: Uvula is midline, oropharynx is clear and moist and mucous membranes are normal. No oropharyngeal exudate, posterior oropharyngeal edema, posterior oropharyngeal erythema or tonsillar abscesses.  Pale boggy turbinates Thick white post nasal drainage  Eyes: Conjunctivae and EOM are normal. Pupils are equal, round, and reactive to light. No scleral icterus.  Neck: Normal range of motion. Neck supple.  Cardiovascular: Normal rate, regular rhythm, normal heart sounds and intact distal pulses.  No murmur heard. Pulmonary/Chest: Effort normal and breath sounds normal. No respiratory distress. She has no wheezes. She has no rales.  Musculoskeletal:  FROM at neck but more discomfort with R rotation and right lateral rotation No midline spine tenderness. Tender to palpation at right paracervical mm  Lymphadenopathy:    She has no cervical adenopathy.  Skin: Skin is warm and dry. No rash noted.       Assessment & Plan:   Problem List Items Addressed This Visit    Post-nasal drainage     No evidence of current infection. Treat with flonase nasal steroid and nasal saline irrigation Update if sxs persist or fail to improve.    Neck pain on right side - Primary     Anticipate cervical neck strain. Supportive care.  Provided with stretching exercises from [facility] pt advisor on cervical strain. No red flags today.        Follow up plan: Return if symptoms worsen or fail to improve.

## 2014-06-25 NOTE — Assessment & Plan Note (Signed)
No evidence of current infection. Treat with flonase nasal steroid and nasal saline irrigation Update if sxs persist or fail to improve.

## 2014-06-25 NOTE — Assessment & Plan Note (Addendum)
Anticipate cervical neck strain. Supportive care.  Provided with stretching exercises from [facility] pt advisor on cervical strain. No red flags today.

## 2014-06-25 NOTE — Patient Instructions (Signed)
Para dolor de cuello - creo ser musculoeskeletal. haga ejercios que le he dado hoy. Para garganta y flema - creo es flema que viene de reciente catarro.  puede usar tessalon perls para tos.

## 2014-07-03 ENCOUNTER — Telehealth: Payer: Self-pay | Admitting: Family Medicine

## 2014-07-03 MED ORDER — ZOSTER VACCINE LIVE 19400 UNT/0.65ML ~~LOC~~ SOLR: 0.6500 mL | Freq: Once | SUBCUTANEOUS | Status: DC

## 2014-07-03 NOTE — Telephone Encounter (Signed)
Pt is going to a household and staying for a few weeks where someone there has active shingles. Pt would like to receive the shingles vaccine. I informed her to check with her insurance co to make sure they cover it. Pt voiced understanding. # [phone]

## 2014-07-03 NOTE — Telephone Encounter (Signed)
Pt called back and ins will pay at pharmacy; pt request shingles vaccine rx [facility] to [facility] ASAP. Pt will not be able to go for injection after 4 PM today and will be leaving on out of town trip in early AM on 07/058/15. Pt request cb when rx sent to pharmacy.

## 2014-07-03 NOTE — Telephone Encounter (Signed)
plz notify [facility]

## 2014-07-03 NOTE — Telephone Encounter (Signed)
Pt notified as instructed by phone; pt very appreciative.

## 2014-07-25 ENCOUNTER — Ambulatory Visit: Payer: Medicare Other | Admitting: Psychology

## 2014-08-08 ENCOUNTER — Other Ambulatory Visit: Payer: Self-pay

## 2014-08-08 ENCOUNTER — Ambulatory Visit: Payer: Medicare Other | Admitting: Psychology

## 2014-08-08 MED ORDER — LEVOTHYROXINE SODIUM 75 MCG PO TABS: ORAL_TABLET | ORAL | Status: DC

## 2014-08-08 NOTE — Telephone Encounter (Signed)
Pt out of town and request refill of namebrand Synthroid to [facility]; advised pt done. Pt has CPX scheduled on 09/17/14.

## 2014-08-22 ENCOUNTER — Ambulatory Visit (INDEPENDENT_AMBULATORY_CARE_PROVIDER_SITE_OTHER): Payer: 59 | Admitting: Psychology

## 2014-08-22 DIAGNOSIS — F4323 Adjustment disorder with mixed anxiety and depressed mood: Secondary | ICD-10-CM | POA: Diagnosis not present

## 2014-08-24 ENCOUNTER — Other Ambulatory Visit: Payer: Self-pay | Admitting: Family Medicine

## 2014-08-24 ENCOUNTER — Other Ambulatory Visit (INDEPENDENT_AMBULATORY_CARE_PROVIDER_SITE_OTHER): Payer: Medicare Other

## 2014-08-24 DIAGNOSIS — E039 Hypothyroidism, unspecified: Secondary | ICD-10-CM

## 2014-08-24 DIAGNOSIS — M949 Disorder of cartilage, unspecified: Secondary | ICD-10-CM | POA: Diagnosis not present

## 2014-08-24 DIAGNOSIS — M8580 Other specified disorders of bone density and structure, unspecified site: Secondary | ICD-10-CM

## 2014-08-24 DIAGNOSIS — M899 Disorder of bone, unspecified: Secondary | ICD-10-CM | POA: Diagnosis not present

## 2014-08-24 LAB — BASIC METABOLIC PANEL
BUN: 20 mg/dL (ref 6–23)
CHLORIDE: 107 meq/L (ref 96–112)
CO2: 28 mEq/L (ref 19–32)
Calcium: 8.4 mg/dL (ref 8.4–10.5)
Creatinine, Ser: 0.7 mg/dL (ref 0.4–1.2)
GFR: 89.11 mL/min (ref >60.00)
Glucose, Bld: 72 mg/dL (ref 70–99)
POTASSIUM: 3.6 meq/L (ref 3.5–5.1)
Sodium: 140 mEq/L (ref 135–145)

## 2014-08-24 LAB — TSH: TSH: 0.43 u[IU]/mL (ref 0.35–4.50)

## 2014-08-24 LAB — LIPID PANEL
CHOLESTEROL: 197 mg/dL (ref 0–200)
HDL: 66.8 mg/dL (ref >39.00)
LDL Cholesterol: 118 mg/dL — ABNORMAL HIGH (ref 0–99)
NonHDL: 130.2
Total CHOL/HDL Ratio: 3
Triglycerides: 63 mg/dL (ref 0.0–149.0)
VLDL: 12.6 mg/dL (ref 0.0–40.0)

## 2014-08-24 LAB — VITAMIN D 25 HYDROXY (VIT D DEFICIENCY, FRACTURES): VITD: 36.41 ng/mL (ref 30.00–100.00)

## 2014-09-13 DIAGNOSIS — H409 Unspecified glaucoma: Secondary | ICD-10-CM | POA: Diagnosis not present

## 2014-09-13 DIAGNOSIS — H4011X0 Primary open-angle glaucoma, stage unspecified: Secondary | ICD-10-CM | POA: Diagnosis not present

## 2014-09-15 DIAGNOSIS — H4011X0 Primary open-angle glaucoma, stage unspecified: Secondary | ICD-10-CM | POA: Diagnosis not present

## 2014-09-15 DIAGNOSIS — H409 Unspecified glaucoma: Secondary | ICD-10-CM | POA: Diagnosis not present

## 2014-09-17 ENCOUNTER — Encounter: Payer: Self-pay | Admitting: Family Medicine

## 2014-09-19 ENCOUNTER — Ambulatory Visit: Payer: Medicare Other | Admitting: Psychology

## 2014-09-20 ENCOUNTER — Encounter: Payer: Self-pay | Admitting: Family Medicine

## 2014-10-02 ENCOUNTER — Encounter: Payer: Self-pay | Admitting: Family Medicine

## 2014-10-17 ENCOUNTER — Telehealth: Payer: Self-pay | Admitting: Family Medicine

## 2014-10-17 ENCOUNTER — Ambulatory Visit (INDEPENDENT_AMBULATORY_CARE_PROVIDER_SITE_OTHER): Payer: 59 | Admitting: Psychology

## 2014-10-17 DIAGNOSIS — F4323 Adjustment disorder with mixed anxiety and depressed mood: Secondary | ICD-10-CM | POA: Diagnosis not present

## 2014-10-17 NOTE — Telephone Encounter (Signed)
Patient notified August labs would be okay.

## 2014-10-17 NOTE — Telephone Encounter (Signed)
Pt came in today to schedule cpx.  schedule for 11/3.  She had labs done 08/24/14 for her cpx that she canceled.  She wanted to know if she needed to do more labs or are the ones she done in aug ok

## 2014-10-30 ENCOUNTER — Encounter: Payer: Self-pay | Admitting: Family Medicine

## 2014-10-30 ENCOUNTER — Ambulatory Visit (INDEPENDENT_AMBULATORY_CARE_PROVIDER_SITE_OTHER): Payer: Medicare Other | Admitting: Family Medicine

## 2014-10-30 VITALS — BP 118/70 | HR 72 | Temp 97.1°F | Ht 63.5 in | Wt 152.5 lb

## 2014-10-30 DIAGNOSIS — E039 Hypothyroidism, unspecified: Secondary | ICD-10-CM

## 2014-10-30 DIAGNOSIS — Z0000 Encounter for general adult medical examination without abnormal findings: Secondary | ICD-10-CM

## 2014-10-30 DIAGNOSIS — M8580 Other specified disorders of bone density and structure, unspecified site: Secondary | ICD-10-CM

## 2014-10-30 DIAGNOSIS — Z23 Encounter for immunization: Secondary | ICD-10-CM

## 2014-10-30 DIAGNOSIS — Z1211 Encounter for screening for malignant neoplasm of colon: Secondary | ICD-10-CM

## 2014-10-30 DIAGNOSIS — Z1239 Encounter for other screening for malignant neoplasm of breast: Secondary | ICD-10-CM

## 2014-10-30 DIAGNOSIS — H409 Unspecified glaucoma: Secondary | ICD-10-CM

## 2014-10-30 DIAGNOSIS — Z7189 Other specified counseling: Secondary | ICD-10-CM | POA: Insufficient documentation

## 2014-10-30 NOTE — Assessment & Plan Note (Signed)

## 2014-10-30 NOTE — Assessment & Plan Note (Signed)
Advanced directives: packet received last year, did not review. Would want husband to be HCPOA.

## 2014-10-30 NOTE — Assessment & Plan Note (Signed)
Stable. Followed closely by ophtho.

## 2014-10-30 NOTE — Progress Notes (Signed)
Pre visit review using our clinic review tool, if applicable. No additional management support is needed unless otherwise documented below in the visit note. 

## 2014-10-30 NOTE — Assessment & Plan Note (Signed)
Continue fosamax, calcium and Vit D and evista - intermittent use. Rpt dexa schedule along with mammogram.

## 2014-10-30 NOTE — Patient Instructions (Addendum)
prevnar today[facility] Stool kit today. pase por la oficina de [place] para hacer cita para mamograma y bone density scan. llame para hacer cita con Dr Hulan Fray. Gusto verla hoy,llamenos con preguntas. regresar en 1 ao para proximo examen fisico.

## 2014-10-30 NOTE — Assessment & Plan Note (Signed)
Lab Results  Component Value Date   TSH 0.43 08/24/2014  Continue synthroid

## 2014-10-30 NOTE — Addendum Note (Signed)
Addended by: Royann Shivers A on: 10/30/2014 11:36 AM   Modules accepted: Orders

## 2014-10-30 NOTE — Progress Notes (Signed)
BP 118/70 mmHg  Pulse 72  Temp(Src) 97.1 F (36.2 C) (Oral)  Ht 5' 3.5" (1.613 m)  Wt 152 lb 8 oz (69.174 kg)  BMI 26.59 kg/m2   CC: medicare wellness visit  Subjective:    Patient ID: Beth Rodgers, female    DOB: 07-01-1935, 79 y.o.   MRN: [id number]  HPI: Beth Rodgers is a 79 y.o. female presenting on 10/30/2014 for Annual Exam   Passes hearing screen. Recent eye exam Denies depression, anhedonia  No falls in last year.  Preventative: Declines colonoscopy, stool kits normal to date. Rpt today. Mammogram at [facility] 09/2013 WNL Well woman with Dr Hulan Fray. DEXA - 2010, osteopenia [facility]  Pneumovax 10/2012. prevnar today.  No shingles shot  Advanced directives: packet received last year, did not review. Would want husband to be HCPOA.   Caffeine: 2-3 cups/day  Lives with husband, majority of time alone as he travels to [place].  Has grandchildren nearby.  Occ: Field seismologist, Metallurgist  Activity: bed exercises, limiting walking  Diet: does get fruits and vegetables, only some water   Relevant past medical, surgical, family and social history reviewed and updated as indicated.  Allergies and medications reviewed and updated. Current Outpatient Prescriptions on File Prior to Visit  Medication Sig  . alendronate (FOSAMAX) 70 MG tablet TAKE 1 TABLET BY MOUTH EVERY 7 DAYS TAKE WITH A FULL GLASS OF WATER ON AN EMPTY STOMACH  . calcipotriene-betamethasone (TACLONEX) ointment Apply 1 application topically daily.    Marland Kitchen CALCIUM CARBONATE PO Take 600 mg by mouth as directed.   . celecoxib (CELEBREX) 100 MG capsule Take 1 capsule (100 mg total) by mouth 2 (two) times daily as needed for pain.  Marland Kitchen CICLOPIROX EX Apply topically as needed.  Marland Kitchen EVISTA 60 MG tablet TAKE 1 TABLET BY MOUTH ONCE A DAY  . levothyroxine (SYNTHROID) 75 MCG tablet TAKE 1 TABLET BY MOUTH ONCE A DAY  . pentoxifylline (TRENTAL) 400 MG CR tablet Take 1 tablet (400 mg total) by mouth 3 (three)  times daily with meals.  . tacrolimus (PROTOPIC) 0.1 % ointment Apply 1 application topically 2 (two) times daily.    . travoprost, benzalkonium, (TRAVATAN) 0.004 % ophthalmic solution Place 1 drop into the left eye at bedtime.    Marland Kitchen VITAMIN D, CHOLECALCIFEROL, PO Take 1,000 Units by mouth as directed.   . Ascorbic Acid (VITAMIN C CR PO) Take by mouth as directed.    . betamethasone, augmented, (DIPROLENE) 0.05 % lotion Apply 1 application topically 2 (two) times daily.    . fluticasone (FLONASE) 50 MCG/ACT nasal spray Place 2 sprays into both nostrils daily.  . Homeopathic Products (ARNICARE) GEL Apply 1 application topically as directed.    Marland Kitchen NITROSTAT 0.4 MG SL tablet    No current facility-administered medications on file prior to visit.    Review of Systems Per HPI unless specifically indicated above    Objective:    BP 118/70 mmHg  Pulse 72  Temp(Src) 97.1 F (36.2 C) (Oral)  Ht 5' 3.5" (1.613 m)  Wt 152 lb 8 oz (69.174 kg)  BMI 26.59 kg/m2  Physical Exam  Constitutional: She is oriented to person, place, and time. She appears well-developed and well-nourished. No distress.  HENT:  Head: Normocephalic and atraumatic.  Right Ear: Hearing, tympanic membrane, external ear and ear canal normal.  Left Ear: Hearing, tympanic membrane, external ear and ear canal normal.  Nose: Nose normal.  Mouth/Throat: Uvula  is midline, oropharynx is clear and moist and mucous membranes are normal. No oropharyngeal exudate, posterior oropharyngeal edema or posterior oropharyngeal erythema.  Eyes: Conjunctivae and EOM are normal. Pupils are equal, round, and reactive to light. No scleral icterus.  Neck: Normal range of motion. Neck supple. Carotid bruit is not present. No thyromegaly present.  Cardiovascular: Normal rate, regular rhythm, normal heart sounds and intact distal pulses.   No murmur heard. Pulses:      Radial pulses are 2+ on the right side, and 2+ on the left side.  Pulmonary/Chest:  Effort normal and breath sounds normal. No respiratory distress. She has no wheezes. She has no rales.  Abdominal: Soft. Bowel sounds are normal. She exhibits no distension and no mass. There is no tenderness. There is no rebound and no guarding.  Musculoskeletal: Normal range of motion. She exhibits no edema.  Lymphadenopathy:    She has no cervical adenopathy.  Neurological: She is alert and oriented to person, place, and time.  CN grossly intact, station and gait intact Recall 3/3 Calculation 5/5 serial 7s  Skin: Skin is warm and dry. No rash noted.  Psychiatric: She has a normal mood and affect. Her behavior is normal. Judgment and thought content normal.  Nursing note and vitals reviewed.  Results for orders placed or performed in visit on 08/24/14  TSH  Result Value Ref Range   TSH 0.43 0.35 - 4.50 uIU/mL  Basic metabolic panel  Result Value Ref Range   Sodium 140 135 - 145 mEq/L   Potassium 3.6 3.5 - 5.1 mEq/L   Chloride 107 96 - 112 mEq/L   CO2 28 19 - 32 mEq/L   Glucose, Bld 72 70 - 99 mg/dL   BUN 20 6 - 23 mg/dL   Creatinine, Ser 0.7 0.4 - 1.2 mg/dL   Calcium 8.4 8.4 - 10.5 mg/dL   GFR 89.11 >60.00 mL/min  Vit D  25 hydroxy (rtn osteoporosis monitoring)  Result Value Ref Range   VITD 36.41 30.00 - 100.00 ng/mL  Lipid panel  Result Value Ref Range   Cholesterol 197 0 - 200 mg/dL   Triglycerides 63.0 0.0 - 149.0 mg/dL   HDL 66.80 >39.00 mg/dL   VLDL 12.6 0.0 - 40.0 mg/dL   LDL Cholesterol 118 (H) 0 - 99 mg/dL   Total CHOL/HDL Ratio 3    NonHDL 130.20       Assessment & Plan:   Problem List Items Addressed This Visit    Osteopenia    Continue fosamax, calcium and Vit D and evista - intermittent use. Rpt dexa schedule along with mammogram.    Medicare annual wellness visit, subsequent - Primary    I have personally reviewed the Medicare Annual Wellness questionnaire and have noted 1. The patient's medical and social history 2. Their use of alcohol, tobacco or  illicit drugs 3. Their current medications and supplements 4. The patient's functional ability including ADL's, fall risks, home safety risks and hearing or visual impairment. 5. Diet and physical activity 6. Evidence for depression or mood disorders The patients weight, height, BMI have been recorded in the chart.  Hearing and vision has been addressed. I have made referrals, counseling and provided education to the patient based review of the above and I have provided the pt with a written personalized care plan for preventive services. Provider list updated - see scanned questionairre.  Reviewed preventative protocols and updated unless pt declined.    Hypothyroid    Lab Results  Component Value Date   TSH 0.43 08/24/2014  Continue synthroid    Advanced care planning/counseling discussion    Advanced directives: packet received last year, did not review. Would want husband to be HCPOA.      Other Visit Diagnoses    Special screening for malignant neoplasms, colon        Relevant Orders       Fecal occult blood, imunochemical        Follow up plan: No Follow-up on file.

## 2014-10-31 ENCOUNTER — Ambulatory Visit (INDEPENDENT_AMBULATORY_CARE_PROVIDER_SITE_OTHER): Payer: Medicare Other | Admitting: Psychology

## 2014-10-31 DIAGNOSIS — F4323 Adjustment disorder with mixed anxiety and depressed mood: Secondary | ICD-10-CM

## 2014-11-02 DIAGNOSIS — M7660 Achilles tendinitis, unspecified leg: Secondary | ICD-10-CM | POA: Diagnosis not present

## 2014-11-02 DIAGNOSIS — M25562 Pain in left knee: Secondary | ICD-10-CM | POA: Diagnosis not present

## 2014-11-08 ENCOUNTER — Encounter: Payer: Self-pay | Admitting: Psychiatry

## 2014-11-08 DIAGNOSIS — M25562 Pain in left knee: Secondary | ICD-10-CM | POA: Diagnosis not present

## 2014-11-08 DIAGNOSIS — R262 Difficulty in walking, not elsewhere classified: Secondary | ICD-10-CM | POA: Diagnosis not present

## 2014-11-08 DIAGNOSIS — M7660 Achilles tendinitis, unspecified leg: Secondary | ICD-10-CM | POA: Diagnosis not present

## 2014-11-09 ENCOUNTER — Encounter: Payer: Self-pay | Admitting: *Deleted

## 2014-11-09 ENCOUNTER — Other Ambulatory Visit: Payer: Medicare Other

## 2014-11-09 DIAGNOSIS — Z1211 Encounter for screening for malignant neoplasm of colon: Secondary | ICD-10-CM

## 2014-11-09 LAB — FECAL OCCULT BLOOD, IMMUNOCHEMICAL: Fecal Occult Bld: NEGATIVE

## 2014-11-09 LAB — FECAL OCCULT BLOOD, GUAIAC: FECAL OCCULT BLD: NEGATIVE

## 2014-11-12 DIAGNOSIS — M25562 Pain in left knee: Secondary | ICD-10-CM | POA: Diagnosis not present

## 2014-11-12 DIAGNOSIS — R262 Difficulty in walking, not elsewhere classified: Secondary | ICD-10-CM | POA: Diagnosis not present

## 2014-11-12 DIAGNOSIS — M7660 Achilles tendinitis, unspecified leg: Secondary | ICD-10-CM | POA: Diagnosis not present

## 2014-11-13 ENCOUNTER — Other Ambulatory Visit: Payer: Self-pay | Admitting: Family Medicine

## 2014-11-14 ENCOUNTER — Ambulatory Visit (INDEPENDENT_AMBULATORY_CARE_PROVIDER_SITE_OTHER): Payer: 59 | Admitting: Psychology

## 2014-11-14 DIAGNOSIS — Z23 Encounter for immunization: Secondary | ICD-10-CM | POA: Diagnosis not present

## 2014-11-14 DIAGNOSIS — R262 Difficulty in walking, not elsewhere classified: Secondary | ICD-10-CM | POA: Diagnosis not present

## 2014-11-14 DIAGNOSIS — F4323 Adjustment disorder with mixed anxiety and depressed mood: Secondary | ICD-10-CM | POA: Diagnosis not present

## 2014-11-14 DIAGNOSIS — M25562 Pain in left knee: Secondary | ICD-10-CM | POA: Diagnosis not present

## 2014-11-14 DIAGNOSIS — M7660 Achilles tendinitis, unspecified leg: Secondary | ICD-10-CM | POA: Diagnosis not present

## 2014-11-15 ENCOUNTER — Ambulatory Visit: Payer: Self-pay | Admitting: Obstetrics & Gynecology

## 2014-11-20 DIAGNOSIS — M7660 Achilles tendinitis, unspecified leg: Secondary | ICD-10-CM | POA: Diagnosis not present

## 2014-11-20 DIAGNOSIS — M25562 Pain in left knee: Secondary | ICD-10-CM | POA: Diagnosis not present

## 2014-11-20 DIAGNOSIS — R262 Difficulty in walking, not elsewhere classified: Secondary | ICD-10-CM | POA: Diagnosis not present

## 2014-11-27 ENCOUNTER — Ambulatory Visit (INDEPENDENT_AMBULATORY_CARE_PROVIDER_SITE_OTHER): Payer: Medicare Other | Admitting: Obstetrics & Gynecology

## 2014-11-27 ENCOUNTER — Encounter: Payer: Self-pay | Admitting: Obstetrics & Gynecology

## 2014-11-27 ENCOUNTER — Encounter: Payer: Self-pay | Admitting: Psychiatry

## 2014-11-27 VITALS — BP 165/105 | HR 78 | Wt 154.0 lb

## 2014-11-27 DIAGNOSIS — Z1231 Encounter for screening mammogram for malignant neoplasm of breast: Secondary | ICD-10-CM | POA: Diagnosis not present

## 2014-11-27 DIAGNOSIS — R262 Difficulty in walking, not elsewhere classified: Secondary | ICD-10-CM | POA: Diagnosis not present

## 2014-11-27 DIAGNOSIS — Z0000 Encounter for general adult medical examination without abnormal findings: Secondary | ICD-10-CM

## 2014-11-27 DIAGNOSIS — M25562 Pain in left knee: Secondary | ICD-10-CM | POA: Diagnosis not present

## 2014-11-27 DIAGNOSIS — Z01419 Encounter for gynecological examination (general) (routine) without abnormal findings: Secondary | ICD-10-CM

## 2014-11-27 DIAGNOSIS — M7662 Achilles tendinitis, left leg: Secondary | ICD-10-CM | POA: Diagnosis not present

## 2014-11-27 NOTE — Progress Notes (Signed)
Subjective:    Beth Rodgers is a 79 y.o. MW female who presents for an annual exam. The patient has no complaints today. The patient is not currently sexually active. GYN screening history: last pap: was normal. The patient wears seatbelts: yes. The patient participates in regular exercise: yes. Has the patient ever been transfused or tattooed?: no. The patient reports that there is not domestic violence in her life.   Menstrual History: OB History    No data available      Menarche age: 50  No LMP recorded. Patient is postmenopausal.    The following portions of the patient's history were reviewed and updated as appropriate: allergies, current medications, past family history, past medical history, past social history, past surgical history and problem list.  Review of Systems A comprehensive review of systems was negative. Married for 45 years. She has had her pneumonia and flu vaccines. Dr. Leo Grosser is her FP. She does her stool cards every year. Her mammogram is this week.   Objective:    BP 165/105 mmHg  Pulse 78  Wt 154 lb (69.854 kg)  General Appearance:    Alert, cooperative, no distress, appears stated age  Head:    Normocephalic, without obvious abnormality, atraumatic  Eyes:    PERRL, conjunctiva/corneas clear, EOM's intact, fundi    benign, both eyes  Ears:    Normal TM's and external ear canals, both ears  Nose:   Nares normal, septum midline, mucosa normal, no drainage    or sinus tenderness  Throat:   Lips, mucosa, and tongue normal; teeth and gums normal  Neck:   Supple, symmetrical, trachea midline, no adenopathy;    thyroid:  no enlargement/tenderness/nodules; no carotid   bruit or JVD  Back:     Symmetric, no curvature, ROM normal, no CVA tenderness  Lungs:     Clear to auscultation bilaterally, respirations unlabored  Chest Wall:    No tenderness or deformity   Heart:    Regular rate and rhythm, S1 and S2 normal, no murmur, rub   or gallop  Breast Exam:     No tenderness, masses, or nipple abnormality  Abdomen:     Soft, non-tender, bowel sounds active all four quadrants,    no masses, no organomegaly  Genitalia:    Normal female without lesion, discharge or tenderness, marked atrophy throughout. Cervix atrophic and normal NSSA, NT, mobile, no palpable adnexa or masses     Extremities:   Extremities normal, atraumatic, no cyanosis or edema  Pulses:   2+ and symmetric all extremities  Skin:   Skin color, texture, turgor normal, no rashes or lesions  Lymph nodes:   Cervical, supraclavicular, and axillary nodes normal  Neurologic:   CNII-XII intact, normal strength, sensation and reflexes    throughout  .    Assessment:    Healthy female exam.    Plan:     Breast self exam technique reviewed and patient encouraged to perform self-exam monthly. Mammogram.

## 2014-11-28 ENCOUNTER — Telehealth: Payer: Self-pay | Admitting: *Deleted

## 2014-11-28 ENCOUNTER — Ambulatory Visit (INDEPENDENT_AMBULATORY_CARE_PROVIDER_SITE_OTHER): Payer: Medicare Other | Admitting: Psychology

## 2014-11-28 DIAGNOSIS — F4323 Adjustment disorder with mixed anxiety and depressed mood: Secondary | ICD-10-CM | POA: Diagnosis not present

## 2014-11-28 MED ORDER — LORAZEPAM 0.5 MG PO TABS: 0.2500 mg | ORAL_TABLET | Freq: Two times a day (BID) | ORAL | Status: DC | PRN

## 2014-11-28 NOTE — Telephone Encounter (Addendum)
Dr. Rexene Edison approached me today about patient. She was in her office for a visit and Dr. Rexene Edison said that patient was basically having a "meltdown" over all the stressors that have piled on her all at once (her husband's CA ? Returning, a son that isn't doing well and a grandchild that she is worried about as well) Dr. Rexene Edison thinks that patient may need something just to help take the edge off for a short time and asks that you prescribe something. I spoke with patient and told her that I would call her and let her know what you decided. She asked that it not be anything too strong since she has to help with driving/navigation to [place] to husbands oncologist.  **After speaking with you, I spoke with patient again to let her know you were in a meeting and it would be later tonight before you could address the message. She verbalized understanding and asked if at all possible to call in Rx tonight and then call her and let her know so she could try it tonight to see how she reacts with it. They are leaving tomorrow night to drive through the night. She said if you can't call it in tonight, she understands.

## 2014-11-28 NOTE — Telephone Encounter (Signed)
spoke with patient - will try lorazepam. Discussed shouldn't take if driving, start a 1/2 tab. Called into pharmacy.

## 2014-11-29 ENCOUNTER — Encounter: Payer: Self-pay | Admitting: *Deleted

## 2014-11-29 ENCOUNTER — Encounter: Payer: Self-pay | Admitting: Family Medicine

## 2014-11-29 ENCOUNTER — Ambulatory Visit: Payer: Self-pay | Admitting: Family Medicine

## 2014-11-29 DIAGNOSIS — Z1231 Encounter for screening mammogram for malignant neoplasm of breast: Secondary | ICD-10-CM | POA: Diagnosis not present

## 2014-11-29 LAB — HM MAMMOGRAPHY: HM MAMMO: NORMAL

## 2014-12-05 DIAGNOSIS — L570 Actinic keratosis: Secondary | ICD-10-CM | POA: Diagnosis not present

## 2014-12-05 DIAGNOSIS — L400 Psoriasis vulgaris: Secondary | ICD-10-CM | POA: Diagnosis not present

## 2014-12-05 DIAGNOSIS — Z23 Encounter for immunization: Secondary | ICD-10-CM | POA: Diagnosis not present

## 2014-12-05 DIAGNOSIS — B078 Other viral warts: Secondary | ICD-10-CM | POA: Diagnosis not present

## 2014-12-05 DIAGNOSIS — B353 Tinea pedis: Secondary | ICD-10-CM | POA: Diagnosis not present

## 2014-12-05 DIAGNOSIS — I8311 Varicose veins of right lower extremity with inflammation: Secondary | ICD-10-CM | POA: Diagnosis not present

## 2014-12-05 DIAGNOSIS — Z85828 Personal history of other malignant neoplasm of skin: Secondary | ICD-10-CM | POA: Diagnosis not present

## 2014-12-05 DIAGNOSIS — I8312 Varicose veins of left lower extremity with inflammation: Secondary | ICD-10-CM | POA: Diagnosis not present

## 2014-12-05 DIAGNOSIS — Z08 Encounter for follow-up examination after completed treatment for malignant neoplasm: Secondary | ICD-10-CM | POA: Diagnosis not present

## 2014-12-12 ENCOUNTER — Ambulatory Visit: Payer: Medicare Other | Admitting: Psychology

## 2014-12-28 ENCOUNTER — Encounter: Payer: Self-pay | Admitting: Psychiatry

## 2015-01-09 ENCOUNTER — Ambulatory Visit: Payer: Medicare Other | Admitting: Psychology

## 2015-01-21 DIAGNOSIS — J209 Acute bronchitis, unspecified: Secondary | ICD-10-CM | POA: Diagnosis not present

## 2015-01-21 DIAGNOSIS — M150 Primary generalized (osteo)arthritis: Secondary | ICD-10-CM | POA: Diagnosis not present

## 2015-01-21 DIAGNOSIS — E039 Hypothyroidism, unspecified: Secondary | ICD-10-CM | POA: Diagnosis not present

## 2015-02-06 ENCOUNTER — Ambulatory Visit (INDEPENDENT_AMBULATORY_CARE_PROVIDER_SITE_OTHER): Payer: Medicare Other | Admitting: Psychology

## 2015-02-06 DIAGNOSIS — F4323 Adjustment disorder with mixed anxiety and depressed mood: Secondary | ICD-10-CM

## 2015-02-15 ENCOUNTER — Telehealth: Payer: Self-pay | Admitting: Family Medicine

## 2015-02-15 DIAGNOSIS — I872 Venous insufficiency (chronic) (peripheral): Secondary | ICD-10-CM

## 2015-02-15 NOTE — Telephone Encounter (Signed)
Would suggest Dr Lucky Cowboy at [facility] Referral placed

## 2015-02-15 NOTE — Telephone Encounter (Signed)
Patient came by in person to ask for a Referral to see a Vascular Surgeon for her varicose veins. She saw a Dr at [facility] only once and was rescheduled twice since then and would like to see someone you recommend. I told her about [facility] in [place] and now [facility] is also part of - Dr Delana Meyer and Dr Lucky Cowboy. Do you have a preference. I know that Dr Donnetta Hutching in [place] is not seeing New pts but his partners are. Please place referral and advise who you would like her to see. I had her sign a release from [place] [facility] but I think we have what we need to send to the [facility].

## 2015-02-20 ENCOUNTER — Ambulatory Visit (INDEPENDENT_AMBULATORY_CARE_PROVIDER_SITE_OTHER): Payer: Medicare Other | Admitting: Psychology

## 2015-02-20 DIAGNOSIS — F4323 Adjustment disorder with mixed anxiety and depressed mood: Secondary | ICD-10-CM

## 2015-02-21 NOTE — Telephone Encounter (Signed)
Appt made and patient notified 

## 2015-03-05 DIAGNOSIS — M79609 Pain in unspecified limb: Secondary | ICD-10-CM | POA: Diagnosis not present

## 2015-03-05 DIAGNOSIS — M7989 Other specified soft tissue disorders: Secondary | ICD-10-CM | POA: Diagnosis not present

## 2015-03-05 DIAGNOSIS — I8310 Varicose veins of unspecified lower extremity with inflammation: Secondary | ICD-10-CM | POA: Diagnosis not present

## 2015-03-06 ENCOUNTER — Ambulatory Visit (INDEPENDENT_AMBULATORY_CARE_PROVIDER_SITE_OTHER): Payer: Medicare Other | Admitting: Psychology

## 2015-03-06 DIAGNOSIS — F4323 Adjustment disorder with mixed anxiety and depressed mood: Secondary | ICD-10-CM

## 2015-03-12 ENCOUNTER — Ambulatory Visit (INDEPENDENT_AMBULATORY_CARE_PROVIDER_SITE_OTHER): Payer: Medicare Other | Admitting: Psychology

## 2015-03-12 DIAGNOSIS — F4323 Adjustment disorder with mixed anxiety and depressed mood: Secondary | ICD-10-CM | POA: Diagnosis not present

## 2015-03-19 ENCOUNTER — Ambulatory Visit (INDEPENDENT_AMBULATORY_CARE_PROVIDER_SITE_OTHER): Payer: Medicare Other | Admitting: Psychology

## 2015-03-19 DIAGNOSIS — F4323 Adjustment disorder with mixed anxiety and depressed mood: Secondary | ICD-10-CM

## 2015-03-20 DIAGNOSIS — S76311A Strain of muscle, fascia and tendon of the posterior muscle group at thigh level, right thigh, initial encounter: Secondary | ICD-10-CM | POA: Diagnosis not present

## 2015-03-20 DIAGNOSIS — M545 Low back pain: Secondary | ICD-10-CM | POA: Diagnosis not present

## 2015-03-26 ENCOUNTER — Ambulatory Visit (INDEPENDENT_AMBULATORY_CARE_PROVIDER_SITE_OTHER): Payer: Medicare Other | Admitting: Psychology

## 2015-03-26 DIAGNOSIS — F4323 Adjustment disorder with mixed anxiety and depressed mood: Secondary | ICD-10-CM | POA: Diagnosis not present

## 2015-04-03 ENCOUNTER — Ambulatory Visit (INDEPENDENT_AMBULATORY_CARE_PROVIDER_SITE_OTHER): Payer: Medicare Other | Admitting: Psychology

## 2015-04-03 DIAGNOSIS — F4323 Adjustment disorder with mixed anxiety and depressed mood: Secondary | ICD-10-CM

## 2015-04-17 ENCOUNTER — Telehealth: Payer: Self-pay | Admitting: Family Medicine

## 2015-04-17 ENCOUNTER — Ambulatory Visit (INDEPENDENT_AMBULATORY_CARE_PROVIDER_SITE_OTHER): Payer: Medicare Other | Admitting: Psychology

## 2015-04-17 DIAGNOSIS — F4323 Adjustment disorder with mixed anxiety and depressed mood: Secondary | ICD-10-CM | POA: Diagnosis not present

## 2015-04-17 NOTE — Telephone Encounter (Signed)
Pt came in to let us know that she [facility] flu [facility] [place].  She does not know the exact date.  If you need any further information her number is [phone].  She also want to know what vaccines she has had here and if she needs any others as she is traveling to [place].  Are there any other vaccines that she should have?

## 2015-04-17 NOTE — Telephone Encounter (Signed)
What part of [place]? Would ask if she's had Hep A/B and would offer those. Would recommend wear insect repellant to avoid mosquito bites.

## 2015-04-17 NOTE — Telephone Encounter (Signed)
Chart had already been updated with flu vaccine.

## 2015-04-18 NOTE — Telephone Encounter (Signed)
Patient notified and said she is going to the [place]. She can't remember if she has had Hep A/B vaccines. She will call back to schedule nurse visit.

## 2015-05-01 ENCOUNTER — Ambulatory Visit (INDEPENDENT_AMBULATORY_CARE_PROVIDER_SITE_OTHER): Payer: Medicare Other | Admitting: Psychology

## 2015-05-01 DIAGNOSIS — F4323 Adjustment disorder with mixed anxiety and depressed mood: Secondary | ICD-10-CM | POA: Diagnosis not present

## 2015-05-03 DIAGNOSIS — M79609 Pain in unspecified limb: Secondary | ICD-10-CM | POA: Diagnosis not present

## 2015-05-03 DIAGNOSIS — I8310 Varicose veins of unspecified lower extremity with inflammation: Secondary | ICD-10-CM | POA: Diagnosis not present

## 2015-05-03 DIAGNOSIS — M7989 Other specified soft tissue disorders: Secondary | ICD-10-CM | POA: Diagnosis not present

## 2015-05-14 ENCOUNTER — Other Ambulatory Visit: Payer: Self-pay | Admitting: Family Medicine

## 2015-05-15 ENCOUNTER — Ambulatory Visit (INDEPENDENT_AMBULATORY_CARE_PROVIDER_SITE_OTHER): Payer: Medicare Other | Admitting: Psychology

## 2015-05-15 ENCOUNTER — Ambulatory Visit: Payer: Medicare Other

## 2015-05-15 DIAGNOSIS — F4323 Adjustment disorder with mixed anxiety and depressed mood: Secondary | ICD-10-CM | POA: Diagnosis not present

## 2015-05-16 ENCOUNTER — Encounter: Payer: Self-pay | Admitting: Family Medicine

## 2015-05-16 ENCOUNTER — Ambulatory Visit (INDEPENDENT_AMBULATORY_CARE_PROVIDER_SITE_OTHER): Payer: Medicare Other | Admitting: Family Medicine

## 2015-05-16 VITALS — BP 134/70 | HR 77 | Temp 97.7°F | Wt 156.2 lb

## 2015-05-16 DIAGNOSIS — R042 Hemoptysis: Secondary | ICD-10-CM | POA: Diagnosis not present

## 2015-05-16 DIAGNOSIS — L84 Corns and callosities: Secondary | ICD-10-CM | POA: Diagnosis not present

## 2015-05-16 NOTE — Progress Notes (Signed)
BP 134/70 mmHg  Pulse 77  Temp(Src) 97.7 F (36.5 C) (Oral)  Wt 156 lb 4 oz (70.875 kg)  SpO2 99%   CC: spit up blood  Subjective:    Patient ID: [name], female    DOB: 08-31-1935, 80 y.o.   MRN: [id number]  HPI: AMAN BATLEY is a 80 y.o. female presenting on 05/16/2015 for spitting blood   Possible upcoming trip to [facility] June.   Episode Tuesday night 2d ago where while brushing teeth noted some blood tinged spit, no clots, no diffuse bleeding, or even drops of blood. 1 hr prior to this she did have mild discomfort in throat treated with lozenge. Doesn't think gumline was irritated at that time.   No fevers/chills, vomiting/nausea, . No cough/hemoptysis.   BP elevated to 155/94 that day.  She went to ER but was advised to go home.   Also L lateral foot corn bothering her.   Relevant past medical, surgical, family and social history reviewed and updated as indicated. Interim medical history since our last visit reviewed. Allergies and medications reviewed and updated. Current Outpatient Prescriptions on File Prior to Visit  Medication Sig  . alendronate (FOSAMAX) 70 MG tablet TAKE 1 TABLET BY MOUTH EVERY 7 DAYS TAKE WITH A FULL GLASS OF WATER ON AN EMPTY STOMACH  . Ascorbic Acid (VITAMIN C CR PO) Take by mouth as directed.    . betamethasone, augmented, (DIPROLENE) 0.05 % lotion Apply 1 application topically 2 (two) times daily.    . calcipotriene-betamethasone (TACLONEX) ointment Apply 1 application topically daily.    Marland Kitchen CALCIUM CARBONATE PO Take 600 mg by mouth as directed.   . celecoxib (CELEBREX) 100 MG capsule Take 1 capsule (100 mg total) by mouth 2 (two) times daily as needed for pain.  Marland Kitchen CICLOPIROX EX Apply topically as needed.  Marland Kitchen EVISTA 60 MG tablet TAKE 1 TABLET BY MOUTH ONCE A DAY  . fluticasone (FLONASE) 50 MCG/ACT nasal spray Place 2 sprays into both nostrils daily.  Marland Kitchen FLUZONE HIGH-DOSE 0.5 ML SUSY   . Homeopathic Products (ARNICARE) GEL Apply 1  application topically as directed.    Marland Kitchen LORazepam (ATIVAN) 0.5 MG tablet Take 0.5-1 tablets (0.25-0.5 mg total) by mouth 2 (two) times daily as needed for anxiety.  . Multiple Vitamin (MULTIVITAMIN) tablet Take 1 tablet by mouth daily.  Marland Kitchen NITROSTAT 0.4 MG SL tablet   . pentoxifylline (TRENTAL) 400 MG CR tablet Take 1 tablet (400 mg total) by mouth 3 (three) times daily with meals.  Marland Kitchen SYNTHROID 75 MCG tablet TAKE 1 TABLET BY MOUTH EVERY DAY  . tacrolimus (PROTOPIC) 0.1 % ointment Apply 1 application topically 2 (two) times daily.    . TRAVATAN Z 0.004 % SOLN ophthalmic solution   . travoprost, benzalkonium, (TRAVATAN) 0.004 % ophthalmic solution Place 1 drop into the left eye at bedtime.    Marland Kitchen VITAMIN D, CHOLECALCIFEROL, PO Take 1,000 Units by mouth as directed.    No current facility-administered medications on file prior to visit.    Review of Systems Per HPI unless specifically indicated above     Objective:    BP 134/70 mmHg  Pulse 77  Temp(Src) 97.7 F (36.5 C) (Oral)  Wt 156 lb 4 oz (70.875 kg)  SpO2 99%  Wt Readings from Last 3 Encounters:  05/16/15 156 lb 4 oz (70.875 kg)  11/27/14 154 lb (69.854 kg)  10/30/14 152 lb 8 oz (69.174 kg)    Physical Exam  Constitutional: She  appears well-developed and well-nourished. No distress.  HENT:  Head: Normocephalic.  Nose: Nose normal. No mucosal edema, rhinorrhea or nose lacerations.  Mouth/Throat: Uvula is midline, oropharynx is clear and moist and mucous membranes are normal. No oropharyngeal exudate, posterior oropharyngeal edema, posterior oropharyngeal erythema or tonsillar abscesses.  No nasal lesions identified No oropharyngeal lesions identified No significant gingivitis noted, no cavities apparent  Neck: No thyromegaly present.  Musculoskeletal:  Corn L lateral foot at base of 5th MT, tender to palpation and swelling present  Lymphadenopathy:    She has no cervical adenopathy.  Skin: Skin is warm and dry. No rash  noted.  Psychiatric: She has a normal mood and affect.  Nursing note and vitals reviewed.  Results for orders placed or performed in visit on 11/29/14  HM MAMMOGRAPHY  Result Value Ref Range   HM Mammogram Normal-Birads 1-Repeat 1 year       Assessment & Plan:   Problem List Items Addressed This Visit    Blood-tinged sputum - Primary    Actually more blood-tinged saliva. Likely from burst small capillary or bleeding gums as occurred while brushing teeth. Oral exam today WNL. Reassured pt, advised she update [place] if recurrent for further evaluation.      Corn of foot    rec callus pad and will refer to podiatry      Relevant Orders   Ambulatory referral to Podiatry       Follow up plan: Return as needed.

## 2015-05-16 NOTE — Assessment & Plan Note (Signed)
Actually more blood-tinged saliva. Likely from burst small capillary or bleeding gums as occurred while brushing teeth. Oral exam today WNL. Reassured pt, advised she update [place] if recurrent for further evaluation.

## 2015-05-16 NOTE — Patient Instructions (Addendum)
Para pie - use corn/callus pad para extra apoyo para que no siga rozando. Haremos cita con podiatra. si puede pase donde [place] para la cita. Para sangre de la via oral - todo se ve bien hoy. dejeme saber si vuelve a pasar. Gusto verla hoy.

## 2015-05-16 NOTE — Assessment & Plan Note (Signed)
rec callus pad and will refer to podiatry

## 2015-05-16 NOTE — Progress Notes (Signed)
Pre visit review using our clinic review tool, if applicable. No additional management support is needed unless otherwise documented below in the visit note. 

## 2015-05-21 ENCOUNTER — Encounter: Payer: Self-pay | Admitting: Podiatry

## 2015-05-21 ENCOUNTER — Ambulatory Visit (INDEPENDENT_AMBULATORY_CARE_PROVIDER_SITE_OTHER): Payer: Medicare Other | Admitting: Podiatry

## 2015-05-21 VITALS — BP 127/80 | HR 74 | Resp 16 | Ht 63.5 in | Wt 156.0 lb

## 2015-05-21 DIAGNOSIS — M79673 Pain in unspecified foot: Secondary | ICD-10-CM | POA: Diagnosis not present

## 2015-05-21 DIAGNOSIS — Q828 Other specified congenital malformations of skin: Secondary | ICD-10-CM

## 2015-05-21 NOTE — Progress Notes (Signed)
Subjective:     Patient ID: Beth Rodgers, female   DOB: 11-16-1935, 80 y.o.   MRN: [id number]  HPI 80 year old female presents the office today with complaints of painful calluses on the side of her left foot. She states that at the callus buildup she has pain to the area protected with pressure and weightbearing. She denies any surrounding redness or drainage. She denies any history of injury or trauma. She said no prior treatment. No other complaints at this time.  Review of Systems  All other systems reviewed and are negative.      Objective:   Physical Exam AAO x3, NAD DP/PT pulses palpable bilaterally, CRT less than 3 seconds Protective sensation intact with Simms Weinstein monofilament, vibratory sensation intact, Achilles tendon reflex intact Left fifth metatarsal base annular, hyperkeratotic lesion. There is mild tenderness to palpation overlying this lesion. Upon debridement lesion there is no pinpoint bleeding or evidence of verruca. The underlying skin is intact and there is no underlying ulceration, drainage or other clinical signs of infection. No other areas of tenderness to bilateral lower extremities. MMT 5/5, ROM WNL.  No open lesions or pre-ulcerative lesions.  No overlying edema, erythema, increase in warmth to bilateral lower extremities.  No pain with calf compression, swelling, warmth, erythema bilaterally.      Assessment:     80 year old female left fifth metatarsal base symptomatic porokeratosis    Plan:     -Treatment options discussed including all alternatives, risks, and complications -Hyperkeratotic lesion which have a degree without complication/bleeding. Pads placed around the lesion followed by salinocaine and an occlusive bandage. This procedure care was discussed with the patient. Monitoring clinical signs or symptoms of infection and directed to call the office immediately should any occur or go to the ER. -Dispensed offloading pads. -Discussed shoe  gear modifications. -Follow-up in 4 weeks if the symptoms have not completely resolved or sooner if any problems are to arise. In the meantime, encouraged to call the office with any questions, concerns, change in symptoms.

## 2015-05-22 ENCOUNTER — Ambulatory Visit (INDEPENDENT_AMBULATORY_CARE_PROVIDER_SITE_OTHER): Payer: Medicare Other | Admitting: Psychology

## 2015-05-22 DIAGNOSIS — F4323 Adjustment disorder with mixed anxiety and depressed mood: Secondary | ICD-10-CM

## 2015-06-05 ENCOUNTER — Ambulatory Visit (INDEPENDENT_AMBULATORY_CARE_PROVIDER_SITE_OTHER): Payer: Medicare Other | Admitting: Psychology

## 2015-06-05 ENCOUNTER — Ambulatory Visit (INDEPENDENT_AMBULATORY_CARE_PROVIDER_SITE_OTHER): Payer: Medicare Other | Admitting: *Deleted

## 2015-06-05 DIAGNOSIS — F4323 Adjustment disorder with mixed anxiety and depressed mood: Secondary | ICD-10-CM | POA: Diagnosis not present

## 2015-06-05 DIAGNOSIS — Z23 Encounter for immunization: Secondary | ICD-10-CM

## 2015-06-12 ENCOUNTER — Ambulatory Visit: Payer: Medicare Other | Admitting: Psychology

## 2015-09-28 HISTORY — PX: OTHER SURGICAL HISTORY: SHX169

## 2015-12-04 ENCOUNTER — Telehealth: Payer: Self-pay | Admitting: Family Medicine

## 2015-12-04 DIAGNOSIS — M8580 Other specified disorders of bone density and structure, unspecified site: Secondary | ICD-10-CM

## 2015-12-04 DIAGNOSIS — Z1239 Encounter for other screening for malignant neoplasm of breast: Secondary | ICD-10-CM

## 2015-12-04 NOTE — Telephone Encounter (Signed)
Appointment 12/1  Pt aware

## 2015-12-04 NOTE — Telephone Encounter (Signed)
Pt called to schedule AWV before end of 2016.  She also would like to have her Bilateral screening MM and Bone Density scan completed ([place] first available) before the end of the year as well for insurance purposes.  Best number to call pt is [phone]

## 2015-12-04 NOTE — Telephone Encounter (Signed)
Updated orders. May schedule AMW at next available. I will likely open up more days at end of month so may schedule then.

## 2015-12-09 ENCOUNTER — Other Ambulatory Visit: Payer: Self-pay | Admitting: Family Medicine

## 2015-12-09 DIAGNOSIS — M8580 Other specified disorders of bone density and structure, unspecified site: Secondary | ICD-10-CM

## 2015-12-09 DIAGNOSIS — E039 Hypothyroidism, unspecified: Secondary | ICD-10-CM

## 2015-12-10 ENCOUNTER — Other Ambulatory Visit (INDEPENDENT_AMBULATORY_CARE_PROVIDER_SITE_OTHER): Payer: Medicare Other

## 2015-12-10 DIAGNOSIS — M8580 Other specified disorders of bone density and structure, unspecified site: Secondary | ICD-10-CM | POA: Diagnosis not present

## 2015-12-10 DIAGNOSIS — E039 Hypothyroidism, unspecified: Secondary | ICD-10-CM

## 2015-12-10 LAB — BASIC METABOLIC PANEL
BUN: 14 mg/dL (ref 6–23)
CALCIUM: 9 mg/dL (ref 8.4–10.5)
CO2: 29 mEq/L (ref 19–32)
Chloride: 105 mEq/L (ref 96–112)
Creatinine, Ser: 0.61 mg/dL (ref 0.40–1.20)
GFR: 100.67 mL/min (ref >60.00)
Glucose, Bld: 83 mg/dL (ref 70–99)
Potassium: 3.5 mEq/L (ref 3.5–5.1)
SODIUM: 141 meq/L (ref 135–145)

## 2015-12-10 LAB — LIPID PANEL
CHOL/HDL RATIO: 3
Cholesterol: 186 mg/dL (ref 0–200)
HDL: 62.7 mg/dL (ref >39.00)
LDL Cholesterol: 111 mg/dL — ABNORMAL HIGH (ref 0–99)
NonHDL: 122.98
Triglycerides: 61 mg/dL (ref 0.0–149.0)
VLDL: 12.2 mg/dL (ref 0.0–40.0)

## 2015-12-10 LAB — TSH: TSH: 1.83 u[IU]/mL (ref 0.35–4.50)

## 2015-12-11 ENCOUNTER — Ambulatory Visit: Payer: Medicare Other | Admitting: Psychology

## 2015-12-17 ENCOUNTER — Ambulatory Visit (INDEPENDENT_AMBULATORY_CARE_PROVIDER_SITE_OTHER): Payer: Medicare Other | Admitting: Physician Assistant

## 2015-12-17 ENCOUNTER — Encounter: Payer: Self-pay | Admitting: Family Medicine

## 2015-12-17 ENCOUNTER — Encounter: Payer: Self-pay | Admitting: Physician Assistant

## 2015-12-17 ENCOUNTER — Ambulatory Visit (INDEPENDENT_AMBULATORY_CARE_PROVIDER_SITE_OTHER): Payer: Medicare Other | Admitting: Family Medicine

## 2015-12-17 VITALS — BP 118/64 | HR 70 | Temp 98.0°F | Wt 154.0 lb

## 2015-12-17 VITALS — BP 145/78 | HR 69 | Resp 18 | Ht 63.5 in | Wt 154.0 lb

## 2015-12-17 DIAGNOSIS — Z1211 Encounter for screening for malignant neoplasm of colon: Secondary | ICD-10-CM

## 2015-12-17 DIAGNOSIS — Z01419 Encounter for gynecological examination (general) (routine) without abnormal findings: Secondary | ICD-10-CM | POA: Diagnosis not present

## 2015-12-17 DIAGNOSIS — Z7189 Other specified counseling: Secondary | ICD-10-CM

## 2015-12-17 DIAGNOSIS — E039 Hypothyroidism, unspecified: Secondary | ICD-10-CM

## 2015-12-17 DIAGNOSIS — I872 Venous insufficiency (chronic) (peripheral): Secondary | ICD-10-CM

## 2015-12-17 DIAGNOSIS — L309 Dermatitis, unspecified: Secondary | ICD-10-CM

## 2015-12-17 DIAGNOSIS — Z23 Encounter for immunization: Secondary | ICD-10-CM

## 2015-12-17 DIAGNOSIS — M8580 Other specified disorders of bone density and structure, unspecified site: Secondary | ICD-10-CM | POA: Diagnosis not present

## 2015-12-17 DIAGNOSIS — I10 Essential (primary) hypertension: Secondary | ICD-10-CM

## 2015-12-17 DIAGNOSIS — Z0000 Encounter for general adult medical examination without abnormal findings: Secondary | ICD-10-CM | POA: Diagnosis not present

## 2015-12-17 DIAGNOSIS — Z639 Problem related to primary support group, unspecified: Secondary | ICD-10-CM

## 2015-12-17 MED ORDER — DESONIDE 0.05 % EX CREA: TOPICAL_CREAM | Freq: Two times a day (BID) | CUTANEOUS | Status: DC

## 2015-12-17 NOTE — Assessment & Plan Note (Signed)
Chronic, stable. Continue Synthroid

## 2015-12-17 NOTE — Assessment & Plan Note (Signed)

## 2015-12-17 NOTE — Patient Instructions (Addendum)
**Note Beth-Identified via Obfuscation** Pass by lab for stool kit. 2nd Hep A/B shot today - vacuna contra Hep A/B segunda dosis hoy [facility].  Puede tratar desonide para la oreja derecha. Gusto verla hoy.  Llamenos con preguntas.   Beth Rodgers (Health Maintenance, Female) Un estilo Beth vida saludable y los cuidados preventivos pueden favorecer considerablemente a la salud y Beth Rodgers. Pregunte a su mdico cul es el cronograma Beth exmenes peridicos apropiado para usted. Esta es una buena oportunidad para consultarlo sobre cmo prevenir enfermedades y Beth Rodgers sano. Adems Beth los controles, hay muchas otras cosas que puede hacer usted mismo. Los expertos han realizado numerosas investigaciones Beth Rodgers cambios en el estilo Beth vida y las medidas Beth prevencin que, Beth Rodgers, lo ayudarn a mantenerse sano. Solicite a su mdico ms informacin. EL PESO Y LA DIETA  Consuma una dieta saludable.  Asegrese Beth [employer] verduras, frutas, productos lcteos Beth bajo contenido Beth Beth Rodgers y Advertising account planner.  No consuma muchos alimentos Beth alto contenido Beth grasas slidas, azcares agregados o sal.  Realice actividad fsica con regularidad. Esta es una Beth las prcticas ms importantes que puede hacer por su salud.  La mayora Beth los adultos deben hacer ejercicio durante al menos 151mnutos por semana. El ejercicio debe aumentar la frecuencia cardaca y pActorla transpiracin (ejercicio Beth iHalliday.  La mayora Beth los adultos tambin deben hField seismologistejercicios Beth elongacin al mToysRusveces a la semana. Agregue esto al su plan Beth ejercicio Beth intensidad moderada. Mantenga un peso saludable.  El ndice Beth masa corporal ([facility] es una medida que puede utilizarse para identificar posibles problemas Beth pPleasant Valley Proporciona una estimacin Beth la grasa corporal basndose en el peso y la altura. Su mdico puede ayudarle a dRadiation protection practitionerIBardolphy a lScientist, forensico mTheatre managerun peso  saludable.  Para las mujeres Beth 20aos o ms:  Un ISaint Luke Institutemenor Beth 18,5 se considera bajo peso.  Un ISouth Central Regional Medical Centerentre 18,5 y 24,9 es normal.  Un ISunrise Hospital And Medical Centerentre 25 y 29,9 se considera sobrepeso.  Un IMC Beth 30 o ms se considera obesidad. Observe los niveles Beth colesterol y lpidos en la sangre.  Debe comenzar a rEnglish as a second language teacherde lpidos y cResearch Rodgers, trade unionen la sangre a los 20aos y luego repetirlos cada 528aos  Es posible que nAutomotive engineerlos niveles Beth colesterol con mayor frecuencia si:  Sus niveles Beth lpidos y colesterol son altos.  Es mayor Beth 50aos.  Presenta un alto riesgo Beth padecer enfermedades cardacas. DETECCIN Beth CNCER  Cncer Beth pulmn  Se recomienda realizar exmenes Beth deteccin Beth cncer Beth pulmn a personas adultas entre 541y 884aos que estn en riesgo Beth dHorticulturist, commercialde pulmn por sus antecedentes Beth consumo Beth tabaco.  Se recomienda una tomografa computarizada Beth baja dosis Beth los pLiberty Mediaaos a las personas que:  Fuman actualmente.  Hayan dejado el hbito en algn momento en los ltimos 15aos.  Hayan fumado durante 30aos un paquete diario. Un paquete-ao equivale a fumar un promedio Beth un paquete Beth cigarrillos diario durante un ao.  Los exmenes Beth deteccin anuales deben continuar hasta que hayan pasado 15aos desde que dej Beth fumar.  Ya no debern realizarse si tiene un problema Beth salud que le impida recibir tratamiento para eScience writerde pulmn. Cncer Beth mama  Practique la autoconciencia Beth la mama. Esto significa reconocer la apariencia normal Beth sus mamas y cmo las siente.  Tambin significa realizar autoexmenes regulares  Beth las [place]. Informe a su mdico sobre cualquier cambio, sin importar cun pequeo sea.  Si tiene entre 20 y 42 aos, un mdico debe realizarle un examen clnico Beth las mamas como parte del examen regular Beth [place], cada 1 a 3aos.  Si tiene 40aos o ms, debe Information systems manager clnico Beth las [employer]. Tambin considere realizarse una [place]) todos los [place].  Si tiene antecedentes familiares Beth cncer Beth mama, hable con su mdico para someterse a un estudio gentico.  Si tiene alto riesgo Beth Chief Financial Rodgers Beth mama, hable con su mdico para someterse a Public house manager y [employer].  La evaluacin del gen del cncer Beth mama (BRCA) se recomienda a mujeres que tengan familiares con cnceres relacionados con el BRCA. Los cnceres relacionados con el BRCA incluyen los siguientes:  Mama.  Ovario.  Trompas.  Cnceres Beth peritoneo.  Los resultados Beth la evaluacin determinarn la necesidad Beth asesoramiento gentico y Beth Talala Beth BRCA1 y BRCA2. Cncer Beth cuello del tero El mdico puede recomendarle que se haga pruebas peridicas Beth deteccin Beth cncer Beth los rganos Beth la pelvis (ovarios, tero y vagina). Estas pruebas incluyen un examen plvico, que abarca controlar si se produjeron cambios microscpicos en la superficie del cuello del tero (prueba Beth Papanicolaou). Pueden recomendarle que se haga estas pruebas cada 3aos, a partir Beth los 21aos.  A las mujeres que tienen entre 30 y 79aos, los mdicos pueden recomendarles que se sometan a exmenes plvicos y pruebas Beth Papanicolaou cada 45aos, o a la prueba Beth Papanicolaou y el examen plvico en combinacin con estudios Beth deteccin del virus del papiloma humano (VPH) cada 5aos. Algunos tipos Beth VPH aumentan el riesgo Beth Chief Financial Rodgers Beth cuello del tero. La prueba para la deteccin del VPH tambin puede realizarse a mujeres Beth cualquier edad cuyos resultados Beth la prueba Beth Papanicolaou no sean claros.  Es posible que otros mdicos no recomienden exmenes Beth deteccin a mujeres no embarazadas que se consideran sujetos Beth bajo riesgo Beth Chief Financial Rodgers Beth pelvis y que no tienen sntomas. Pregntele al mdico si un examen plvico Beth deteccin es adecuado para  usted.  Si ha recibido un tratamiento para Science writer cervical o una enfermedad que podra causar cncer, necesitar realizarse una prueba Beth Papanicolaou y controles durante al menos 60 aos Beth concluido el [place]. Si no se ha hecho el Papanicolaou con regularidad, debern volver a evaluarse los factores Beth riesgo (como tener un nuevo compaero sexual), para Teacher, adult education si debe realizarse los estudios nuevamente. Algunas mujeres sufren problemas mdicos que aumentan la probabilidad Beth Museum/gallery curator cncer Beth cuello del tero. En estos casos, el mdico podr [employer] se realicen controles y pruebas Beth Papanicolaou con ms frecuencia. Cncer colorrectal  Este tipo Beth cncer puede detectarse y a menudo prevenirse.  Por lo general, los estudios Beth rutina se deben Medical laboratory scientific Rodgers a Field seismologist a Proofreader Beth los 68 aos y Beth Rodgers 72 aos.  Sin embargo, el mdico podr aconsejarle que lo haga antes, si tiene factores Beth riesgo para el cncer Beth colon.  Tambin puede recomendarle que use un kit Beth prueba para Beth Rodgers en la materia fecal.  Es posible que se use una pequea cmara en el extremo Beth un tubo para examinar directamente el colon (sigmoidoscopia o colonoscopia) a fin Beth Hydrographic surveyor formas tempranas Beth cncer colorrectal.  Los exmenes Beth rutina generalmente comienzan a los 57aos.  El  examen directo del colon se debe repetir cada 5 a 10aos hasta los 75aos. Sin embargo, es posible que se realicen exmenes con mayor frecuencia, si se detectan formas tempranas Beth plipos precancerosos o pequeos bultos. Cncer Beth piel  Revise la piel Beth la cabeza a los pies con regularidad.  Informe a su mdico si aparecen nuevos lunares o los que tiene se modifican, especialmente en su forma y color.  Tambin notifique al mdico si tiene un lunar que es ms grande que el tamao Beth una goma Beth lpiz.  Siempre use pantalla solar. Aplique pantalla solar Beth Beth Rodgers y repetida a lo largo del  Beth Rodgers.  Protjase usando mangas y [employer], un sombrero Beth ala ancha y gafas para el sol, siempre que se encuentre en el exterior. ENFERMEDADES CARDACAS, DIABETES E HIPERTENSIN ARTERIAL   La hipertensin arterial causa enfermedades cardacas y [place] el riesgo Beth ictus. La hipertensin arterial es ms probable en los siguientes casos:  Las personas que tienen la presin arterial en el extremo del rango normal (100-139/85-89 mm Hg).  Las personas con sobrepeso u obesidad.  Las Retail banker.  Si usted tiene entre 18 y 39 aos, debe medirse la presin arterial cada 3 a 5 aos. Si usted tiene 40 aos o ms, debe medirse la presin arterial [employer]. Debe medirse la presin arterial dos veces: una vez cuando est en un hospital o una clnica y la otra vez cuando est en otro sitio. Registre el promedio Beth [employer]. Para controlar su presin arterial cuando no est en un hospital o [place], puede usar lo siguiente:  Beth Rodgers mquina automtica para medir la presin arterial en una farmacia.  Un monitor para medir la presin arterial en el hogar.  Si tiene entre 36 y 74 aos, consulte a su mdico si debe tomar aspirina para prevenir el ictus.  Realcese exmenes Beth deteccin Beth la diabetes con regularidad. Esto incluye la toma Beth [place] Beth sangre para controlar el nivel Beth azcar en la sangre durante el Beth Rodgers.  Si tiene un peso normal y un bajo riesgo Beth padecer diabetes, realcese este anlisis cada tres aos despus Beth los 45aos.  Si tiene sobrepeso y un alto riesgo Beth padecer diabetes, considere someterse a este anlisis antes o con mayor frecuencia. PREVENCIN Beth INFECCIONES  HepatitisB  Si tiene un riesgo ms alto Beth Museum/gallery curator hepatitis B, debe someterse a un examen Beth deteccin Beth este virus. Se considera que tiene un alto riesgo Beth Museum/gallery curator hepatitis B si:  Naci en un pas donde la hepatitis B es frecuente. Pregntele a su mdico qu pases  son considerados Beth Public affairs consultant.  Sus padres nacieron en un pas Beth alto riesgo y usted no recibi una vacuna que lo proteja contra la hepatitis B (vacuna contra la hepatitis B).  [place].  [place] agujas para inyectarse drogas.  Vive con alguien que tiene hepatitis B.  Ha tenido sexo con alguien que tiene hepatitis B.  Recibe tratamiento Beth hemodilisis.  Toma ciertos medicamentos para el cncer, trasplante Beth rganos y afecciones autoinmunitarias. Hepatitis C  Se recomienda un anlisis Beth [place] para:  Todos los que nacieron entre 1945 y [phone].  Todas las personas que tengan un riesgo Beth haber contrado hepatitis C. Enfermedades Beth transmisin sexual (ETS).  Debe realizarse pruebas Beth deteccin Beth enfermedades Beth transmisin sexual (ETS), incluidas gonorrea y clamidia si:  Es sexualmente activo y es menor Beth 24aos.  Es mayor Beth  51aos, y Investment banker, operational informa que corre riesgo Beth tener este tipo Beth infecciones.  La actividad sexual ha cambiado desde que le hicieron la ltima prueba Beth deteccin y tiene un riesgo mayor Beth Best boy clamidia o Radio broadcast assistant. Pregntele al mdico si usted tiene riesgo.  Si no tiene el VIH, pero corre riesgo Beth infectarse por el virus, se recomienda tomar diariamente un medicamento recetado para evitar la infeccin. Esto se conoce como profilaxis previa a la exposicin. Se considera que est en riesgo si:  Es [place] sexualmente y no [place] preservativos habitualmente o no conoce el estado del VIH Beth sus Advertising copywriter.  Se inyecta drogas.  Es [place] sexualmente con Beth Rodgers pareja que tiene VIH. Consulte a su mdico para saber si tiene un alto riesgo Beth infectarse por el VIH. Si opta por comenzar la profilaxis previa a la exposicin, primero debe realizarse anlisis Beth deteccin del VIH. Luego, le harn anlisis cada 75mses mientras est tomando los medicamentos para la profilaxis previa a la exposicin.  Beth Rodgers  Si es premenopusica y puede quedar  [place] solicite a su mdico asesoramiento previo a la concepcin.  Si puede quedar embarazada, tome 400 a [id number]([id number]) Beth cido fAnheuser-Busch  Si desea evitar el embarazo, hable con su mdico sobre el control Beth la natalidad (anticoncepcin). OSTEOPOROSIS Y MENOPAUSIA   La osteoporosis es una enfermedad en la que los huesos pierden los minerales y la fuerza por el avance Beth la edad. El resultado pueden ser fracturas graves en los hFort Pierce South El riesgo Beth osteoporosis puede identificarse con uArdelia Memsprueba Beth densidad sea.  Si tiene 65aos o ms, o si est en riesgo Beth sufrir osteoporosis y fracturas, pregunte a su mdico si debe someterse a exmenes.  Consulte a su mdico si debe tomar un suplemento Beth calcio o Beth vitamina D para reducir el riesgo Beth osteoporosis.  La menopausia puede presentar ciertos sntomas fsicos y rGaffer  La terapia Beth reemplazo hormonal puede reducir algunos Beth estos sntomas y rGaffer Consulte a su mdico para saber si la terapia Beth reemplazo hormonal es conveniente para usted.  INSTRUCCIONES PARA EL CUIDADO EN EL HOGAR   Realcese los estudios Beth rutina Beth la salud, dentales y Beth lPublic librarian  [place]  No consuma ningn producto que contenga tabaco, lo que incluye cigarrillos, tabaco Beth mHigher education careers advisero cPsychologist, sport and exercise  Si est embarazada, no beba alcohol.  Si est amamantando, reduzca el consumo Beth alcohol y la frecuencia con la que consume.  Si es mujer y no est embarazada limite el consumo Beth alcohol a no ms Beth 1 medida por da. Una medida equivale a 12onzas Beth cerveza, 5onzas Beth vino o 1onzas Beth bebidas alcohlicas Beth alta graduacin.  No consuma drogas.  No comparta agujas.  Solicite ayuda a su mdico si necesita apoyo o informacin para abandonar las drogas.  Informe a su mdico si a menudo se siente deprimido.  Notifique a su mdico si alguna vez ha sido vctima Beth abuso o si no se siente  seguro en su hogar.   Esta informacin no tiene cMarine scientistel consejo del mdico. Asegrese Beth hacerle al mdico cualquier pregunta que tenga.   Document Released: 12/03/2011 Document Revised: 01/04/2015 Elsevier Interactive Patient Education 2[employer]

## 2015-12-17 NOTE — Assessment & Plan Note (Signed)
Pt has stopped evista and fosamax. Upcoming f/u DEXA tomorrow and will decide on treatment at that time.

## 2015-12-17 NOTE — Progress Notes (Signed)
Patient ID: Beth Rodgers, female   DOB: [birth year], 80 y.o.   MRN: [id number] History:  Beth Rodgers is a 80 y.o.  who presents to clinic today for GYN exam.  She reports no problemss.  She has a mammogram and bone density scan scheduled for tomorrow.  She maintains primary care with Dr. Danise Mina.  She has recently started a blood pressure medication.  She denies vaginal bleeding or discharge, abdominal pain, dysuria, breast pain or mass.    The following portions of the patient's history were reviewed and updated as appropriate: allergies, current medications, past family history, past medical history, past social history, past surgical history and problem list.  Review of Systems:  Pertinent items noted in HPI and remainder of comprehensive ROS otherwise negative.  Objective:  Physical Exam BP 145/78 mmHg  Pulse 69  Resp 18  Ht 5' 3.5" (1.613 m)  Wt 154 lb (69.854 kg)  BMI 26.85 kg/m2 GENERAL: Well-developed, well-nourished female in no acute distress.  HEENT: Normocephalic, atraumatic.  NECK: Supple. Normal thyroid.  RESPIRATORY: Normal rate. Clear to auscultation bilaterally.  CARDIOVASCULAR: Regular rate and rhythm with no adventitious sounds.  BREASTS: Symmetric in size. No masses, skin changes, nipple drainage, or lymphadenopathy. ABDOMEN: Soft, nontender, nondistended. No organomegaly. Normal bowel sounds appreciated in all quadrants.  PELVIC: Normal external female genitalia. Bimanual exam without mass or tenderness.   EXTREMITIES: Significant varicosities of bilat lower extremities.  Wears compression hosiery.  No swelling.    Labs and Imaging No results found.  Assessment & Plan:  Assessment: 1. Well woman exam with routine gynecological exam    Plans: Keep appt for mammogram tomorrow SBE monthly Return for problems, 2 years for f/u GYN exam.  Paticia Stack, PA-C 12/17/2015 1:27 PM

## 2015-12-17 NOTE — Assessment & Plan Note (Signed)
May continue johnson eczema cream, add desonide steroid cream as needed.

## 2015-12-17 NOTE — Assessment & Plan Note (Signed)
Persistent trouble with family stressors with son and DIL

## 2015-12-17 NOTE — Patient Instructions (Signed)
Exercising to Stay Healthy Exercising regularly is important. It has many health benefits, such as:  Improving your overall fitness, flexibility, and endurance.  Increasing your bone density.  Helping with weight control.  Decreasing your body fat.  Increasing your muscle strength.  Reducing stress and tension.  Improving your overall health. In order to become healthy and stay healthy, it is recommended that you do moderate-intensity and vigorous-intensity exercise. You can tell that you are exercising at a moderate intensity if you have a higher heart rate and faster breathing, but you are still able to hold a conversation. You can tell that you are exercising at a vigorous intensity if you are breathing much harder and faster and cannot hold a conversation while exercising. HOW OFTEN SHOULD I EXERCISE? Choose an activity that you enjoy and set realistic goals. Your health care provider can help you to make an activity plan that works for you. Exercise regularly as directed by your health care provider. This may include:   Doing resistance training twice each week, such as:  Push-ups.  Sit-ups.  Lifting weights.  Using resistance bands.  Doing a given intensity of exercise for a given amount of time. Choose from these options:  150 minutes of moderate-intensity exercise every week.  75 minutes of vigorous-intensity exercise every week.  A mix of moderate-intensity and vigorous-intensity exercise every week. Children, pregnant women, people who are out of shape, people who are overweight, and older adults may need to consult a health care provider for individual recommendations. If you have any sort of medical condition, be sure to consult your health care provider before starting a new exercise program.  WHAT ARE SOME EXERCISE IDEAS? Some moderate-intensity exercise ideas include:   Walking at a rate of 1 mile in 15  minutes.  Biking.  Hiking.  Golfing.  Dancing. Some vigorous-intensity exercise ideas include:   Walking at a rate of at least 4.5 miles per hour.  Jogging or running at a rate of 5 miles per hour.  Biking at a rate of at least 10 miles per hour.  Lap swimming.  Roller-skating or in-line skating.  Cross-country skiing.  Vigorous competitive sports, such as football, basketball, and soccer.  Jumping rope.  Aerobic dancing. WHAT ARE SOME EVERYDAY ACTIVITIES THAT CAN HELP ME TO GET EXERCISE?  Yard work, such as:  Pushing a lawn mower.  Raking and bagging leaves.  Washing and waxing your car.  Pushing a stroller.  Shoveling snow.  Gardening.  Washing windows or floors. HOW CAN I BE MORE ACTIVE IN MY DAY-TO-DAY ACTIVITIES?  Use the stairs instead of the elevator.  Take a walk during your lunch break.  If you drive, park your car farther away from work or school.  If you take public transportation, get off one stop early and walk the rest of the way.  Make all of your phone calls while standing up and walking around.  Get up, stretch, and walk around every 30 minutes throughout the day. WHAT GUIDELINES SHOULD I FOLLOW WHILE EXERCISING?  Do not exercise so much that you hurt yourself, feel dizzy, or get very short of breath.  Consult your health care provider before starting a new exercise program.  Wear comfortable clothes and shoes with good support.  Drink plenty of water while you exercise to prevent dehydration or heat stroke. Body water is lost during exercise and must be replaced.  Work out until you breathe faster and your heart beats faster.   This information   is not intended to replace advice given to you by your health care provider. Make sure you discuss any questions you have with your health care provider.   Document Released: 01/16/2011 Document Revised: 01/04/2015 Document Reviewed: 05/17/2014 Elsevier Interactive Patient Education  2016 Elsevier Inc.  

## 2015-12-17 NOTE — Assessment & Plan Note (Signed)
S/p recent vein procedure in [place]. Records reviewed and asked to scan.

## 2015-12-17 NOTE — Assessment & Plan Note (Signed)
Advanced directives: packet received 2 yrs ago, did not review. She still has this at home. Will review. Would want husband to be HCPOA.  

## 2015-12-17 NOTE — Assessment & Plan Note (Signed)
Started on losartan 50mg  daily due to fluctuating blood pressures. Stable readings today.

## 2015-12-17 NOTE — Progress Notes (Addendum)
BP 118/64 mmHg  Pulse 70  Temp(Src) 98 F (36.7 C) (Oral)  Wt 154 lb (69.854 kg)  SpO2 98%   CC: medicare wellness visit  Subjective:    Patient ID: Beth Rodgers, female    DOB: Feb 06, 1935, 80 y.o.   MRN: [id number]  HPI: FERMINA RAWLING is a 80 y.o. female presenting on 12/17/2015 for Medicare Wellness   Recent trip to [place], [place] - saw vein doctor with fluctuating blood pressures higher at night time. Had normal holter monitor. Had "sclerofoam" procedure. Started on losartan. Also started on vedipal (diosmin/hesperidin) for circulation.   Osteopenia - stopped fosamax and evista for last several months. Pending DEXA tomorrow.   Passes hearing screen.  Passes vision screen. Denies depression, anhedonia  No falls in last year.  Preventative: Declines colonoscopy, stool kits normal to date. Rpt today. Mammogram at [facility] 11/2014 WNL. Pending tomorrow  Well woman with Karoline Caldwell today, WNL. DEXA Date: 06/2009 T score Spine: -1.8, hip -2.0 Flu shot - today Td 04/2012  Pneumovax 10/2012. prevnar 2015.  zostavax 06/2014. Advanced directives: packet received 2 yrs ago, did not review. She still has this at home. Will review. Would want husband to be HCPOA.  Seat belt use discussed Sunscreen use discussed. Denies changing moles.   Caffeine: 2-3 cups/day  Lives with husband, majority of time alone as he travels to [place].  Has grandchildren nearby  Occ: Field seismologist, Metallurgist  Activity: bed exercises, limiting walking  Diet: does get fruits and vegetables, only some water   Relevant past medical, surgical, family and social history reviewed and updated as indicated. Interim medical history since our last visit reviewed. Allergies and medications reviewed and updated. Current Outpatient Prescriptions on File Prior to Visit  Medication Sig  . betamethasone, augmented, (DIPROLENE) 0.05 % lotion Apply 1 application topically 2 (two) times daily. Reported on  12/17/2015  . calcipotriene-betamethasone (TACLONEX) ointment Apply 1 application topically daily.    . celecoxib (CELEBREX) 100 MG capsule Take 1 capsule (100 mg total) by mouth 2 (two) times daily as needed for pain.  Marland Kitchen CICLOPIROX EX Apply topically as needed. Reported on 12/17/2015  . Homeopathic Products (ARNICARE) GEL Apply 1 application topically as directed.    Marland Kitchen NITROSTAT 0.4 MG SL tablet Reported on 12/17/2015  . SYNTHROID 75 MCG tablet TAKE 1 TABLET BY MOUTH EVERY DAY  . tacrolimus (PROTOPIC) 0.1 % ointment Apply 1 application topically 2 (two) times daily.    . travoprost, benzalkonium, (TRAVATAN) 0.004 % ophthalmic solution Place 1 drop into the left eye at bedtime.    . Ascorbic Acid (VITAMIN C CR PO) Take by mouth as directed. Reported on 12/17/2015  . CALCIUM CARBONATE PO Take 600 mg by mouth as directed. Reported on 12/17/2015  . fluticasone (FLONASE) 50 MCG/ACT nasal spray Place 2 sprays into both nostrils daily. (Patient not taking: Reported on 12/17/2015)  . Multiple Vitamin (MULTIVITAMIN) tablet Take 1 tablet by mouth daily. Reported on 12/17/2015  . pentoxifylline (TRENTAL) 400 MG CR tablet Take 1 tablet (400 mg total) by mouth 3 (three) times daily with meals. (Patient not taking: Reported on 12/17/2015)  . VITAMIN D, CHOLECALCIFEROL, PO Take 1,000 Units by mouth as directed. Reported on 12/17/2015   No current facility-administered medications on file prior to visit.    Review of Systems Per HPI unless specifically indicated in ROS section     Objective:    BP 118/64 mmHg  Pulse 70  Temp(Src) 98 F (36.7 C) (  Oral)  Wt 154 lb (69.854 kg)  SpO2 98%  Wt Readings from Last 3 Encounters:  12/17/15 154 lb (69.854 kg)  12/17/15 154 lb (69.854 kg)  05/21/15 156 lb (70.761 kg)    Physical Exam  Constitutional: She is oriented to person, place, and time. She appears well-developed and well-nourished. No distress.  HENT:  Head: Normocephalic and atraumatic.  Right  Ear: Hearing, tympanic membrane and ear canal normal.  Left Ear: Hearing, tympanic membrane and ear canal normal.  Nose: Nose normal.  Mouth/Throat: Uvula is midline, oropharynx is clear and moist and mucous membranes are normal. No oropharyngeal exudate, posterior oropharyngeal edema or posterior oropharyngeal erythema.  R>L external ear and postauricular scaling dry skin  Eyes: Conjunctivae and EOM are normal. Pupils are equal, round, and reactive to light. No scleral icterus.  Neck: Normal range of motion. Neck supple. Carotid bruit is not present. No thyromegaly present.  Cardiovascular: Normal rate, regular rhythm, normal heart sounds and intact distal pulses.   No murmur heard. Pulses:      Radial pulses are 2+ on the right side, and 2+ on the left side.  Pulmonary/Chest: Effort normal and breath sounds normal. No respiratory distress. She has no wheezes. She has no rales.  Abdominal: Soft. Bowel sounds are normal. She exhibits no distension and no mass. There is no tenderness. There is no rebound and no guarding.  Musculoskeletal: Normal range of motion. She exhibits no edema.  Lymphadenopathy:    She has no cervical adenopathy.  Neurological: She is alert and oriented to person, place, and time.  CN grossly intact, station and gait intact Recall 3/3 Calculation 5/5 serial 7s  Skin: Skin is warm and dry. No rash noted.  Psychiatric: She has a normal mood and affect. Her behavior is normal. Judgment and thought content normal.  Nursing note and vitals reviewed.  Results for orders placed or performed in visit on 12/10/15  Lipid panel  Result Value Ref Range   Cholesterol 186 0 - 200 mg/dL   Triglycerides 61.0 0.0 - 149.0 mg/dL   HDL 62.70 >39.00 mg/dL   VLDL 12.2 0.0 - 40.0 mg/dL   LDL Cholesterol 111 (H) 0 - 99 mg/dL   Total CHOL/HDL Ratio 3    NonHDL 122.98   TSH  Result Value Ref Range   TSH 1.83 0.35 - 4.50 uIU/mL  Basic metabolic panel  Result Value Ref Range    Sodium 141 135 - 145 mEq/L   Potassium 3.5 3.5 - 5.1 mEq/L   Chloride 105 96 - 112 mEq/L   CO2 29 19 - 32 mEq/L   Glucose, Bld 83 70 - 99 mg/dL   BUN 14 6 - 23 mg/dL   Creatinine, Ser 0.61 0.40 - 1.20 mg/dL   Calcium 9.0 8.4 - 10.5 mg/dL   GFR 100.67 >60.00 mL/min      Assessment & Plan:  2nd Hep A/B shot today.  Problem List Items Addressed This Visit    Osteopenia    Pt has stopped evista and fosamax. Upcoming f/u DEXA tomorrow and will decide on treatment at that time.      Medicare annual wellness visit, subsequent - Primary    I have personally reviewed the Medicare Annual Wellness questionnaire and have noted 1. The patient's medical and social history 2. Their use of alcohol, tobacco or illicit drugs 3. Their current medications and supplements 4. The patient's functional ability including ADL's, fall risks, home safety risks and hearing or visual impairment. Cognitive  function has been assessed and addressed as indicated.  5. Diet and physical activity 6. Evidence for depression or mood disorders The patients weight, height, BMI have been recorded in the chart. I have made referrals, counseling and provided education to the patient based on review of the above and I have provided the pt with a written personalized care plan for preventive services. Provider list updated.. See scanned questionairre as needed for further documentation. Reviewed preventative protocols and updated unless pt declined.       Relevant Orders   Hepatitis A hepatitis B combined vaccine IM (Completed)   Hypothyroidism    Chronic, stable. Continue Synthroid      Essential hypertension    Started on losartan 50mg  daily due to fluctuating blood pressures. Stable readings today.      Relevant Medications   losartan (COZAAR) 50 MG tablet   Difficulty with family    Persistent trouble with family stressors with son and DIL      Dermatitis of external ear    May continue johnson eczema  cream, add desonide steroid cream as needed.      Chronic venous insufficiency    S/p recent vein procedure in [place]. Records reviewed and asked to scan.       Relevant Medications   losartan (COZAAR) 50 MG tablet   Advanced care planning/counseling discussion    Advanced directives: packet received 2 yrs ago, did not review. She still has this at home. Will review. Would want husband to be HCPOA.        Other Visit Diagnoses    Special screening for malignant neoplasms, colon        Relevant Orders    Fecal occult blood, imunochemical        Follow up plan: Return in about 6 months (around 06/16/2016), or as needed, for follow up visit.

## 2015-12-18 ENCOUNTER — Ambulatory Visit
Admission: RE | Admit: 2015-12-18 | Discharge: 2015-12-18 | Disposition: A | Discharge status: Home / Self Care | Payer: Medicare Other | Source: Ambulatory Visit | Attending: Family Medicine | Admitting: Family Medicine

## 2015-12-18 ENCOUNTER — Other Ambulatory Visit: Payer: Self-pay | Admitting: Family Medicine

## 2015-12-18 ENCOUNTER — Ambulatory Visit (INDEPENDENT_AMBULATORY_CARE_PROVIDER_SITE_OTHER): Payer: Medicare Other | Admitting: Psychology

## 2015-12-18 DIAGNOSIS — Z780 Asymptomatic menopausal state: Secondary | ICD-10-CM | POA: Insufficient documentation

## 2015-12-18 DIAGNOSIS — M8580 Other specified disorders of bone density and structure, unspecified site: Secondary | ICD-10-CM | POA: Insufficient documentation

## 2015-12-18 DIAGNOSIS — F4323 Adjustment disorder with mixed anxiety and depressed mood: Secondary | ICD-10-CM

## 2015-12-18 DIAGNOSIS — Z13820 Encounter for screening for osteoporosis: Secondary | ICD-10-CM | POA: Insufficient documentation

## 2015-12-18 DIAGNOSIS — Z1231 Encounter for screening mammogram for malignant neoplasm of breast: Secondary | ICD-10-CM | POA: Diagnosis not present

## 2015-12-18 DIAGNOSIS — Z1239 Encounter for other screening for malignant neoplasm of breast: Secondary | ICD-10-CM

## 2015-12-18 LAB — HM DEXA SCAN

## 2015-12-23 ENCOUNTER — Encounter: Payer: Self-pay | Admitting: Family Medicine

## 2015-12-24 ENCOUNTER — Ambulatory Visit: Payer: Self-pay | Admitting: Obstetrics & Gynecology

## 2015-12-24 ENCOUNTER — Encounter: Payer: Self-pay | Admitting: *Deleted

## 2015-12-25 ENCOUNTER — Encounter: Payer: Self-pay | Admitting: *Deleted

## 2015-12-25 ENCOUNTER — Other Ambulatory Visit: Payer: Medicare Other

## 2015-12-25 DIAGNOSIS — Z1211 Encounter for screening for malignant neoplasm of colon: Secondary | ICD-10-CM

## 2015-12-25 LAB — FECAL OCCULT BLOOD, GUAIAC: Fecal Occult Blood: NEGATIVE

## 2015-12-25 LAB — FECAL OCCULT BLOOD, IMMUNOCHEMICAL: Fecal Occult Bld: NEGATIVE

## 2016-01-15 ENCOUNTER — Ambulatory Visit: Payer: Medicare Other | Admitting: Psychology

## 2016-01-22 ENCOUNTER — Ambulatory Visit (INDEPENDENT_AMBULATORY_CARE_PROVIDER_SITE_OTHER): Payer: 59 | Admitting: Psychology

## 2016-01-22 DIAGNOSIS — F4323 Adjustment disorder with mixed anxiety and depressed mood: Secondary | ICD-10-CM | POA: Diagnosis not present

## 2016-02-05 ENCOUNTER — Ambulatory Visit (INDEPENDENT_AMBULATORY_CARE_PROVIDER_SITE_OTHER): Payer: 59 | Admitting: Psychology

## 2016-02-05 DIAGNOSIS — F4323 Adjustment disorder with mixed anxiety and depressed mood: Secondary | ICD-10-CM | POA: Diagnosis not present

## 2016-02-11 ENCOUNTER — Telehealth: Payer: Self-pay | Admitting: Family Medicine

## 2016-02-11 DIAGNOSIS — Z1283 Encounter for screening for malignant neoplasm of skin: Secondary | ICD-10-CM

## 2016-02-11 DIAGNOSIS — Z85828 Personal history of other malignant neoplasm of skin: Secondary | ICD-10-CM

## 2016-02-11 NOTE — Telephone Encounter (Signed)
Patientt was in office with a friend and requested a Dermatology Referral. She has some moles that she wants someone to look at and has a history of basal cell. Please place referral.

## 2016-02-13 NOTE — Telephone Encounter (Signed)
Referral placed.

## 2016-02-14 DIAGNOSIS — L570 Actinic keratosis: Secondary | ICD-10-CM | POA: Diagnosis not present

## 2016-02-14 DIAGNOSIS — I872 Venous insufficiency (chronic) (peripheral): Secondary | ICD-10-CM | POA: Diagnosis not present

## 2016-02-14 DIAGNOSIS — I8311 Varicose veins of right lower extremity with inflammation: Secondary | ICD-10-CM | POA: Diagnosis not present

## 2016-02-14 DIAGNOSIS — D692 Other nonthrombocytopenic purpura: Secondary | ICD-10-CM | POA: Diagnosis not present

## 2016-02-14 DIAGNOSIS — Z85828 Personal history of other malignant neoplasm of skin: Secondary | ICD-10-CM | POA: Diagnosis not present

## 2016-02-14 DIAGNOSIS — I8312 Varicose veins of left lower extremity with inflammation: Secondary | ICD-10-CM | POA: Diagnosis not present

## 2016-02-14 DIAGNOSIS — D225 Melanocytic nevi of trunk: Secondary | ICD-10-CM | POA: Diagnosis not present

## 2016-02-14 DIAGNOSIS — L821 Other seborrheic keratosis: Secondary | ICD-10-CM | POA: Diagnosis not present

## 2016-02-14 DIAGNOSIS — L814 Other melanin hyperpigmentation: Secondary | ICD-10-CM | POA: Diagnosis not present

## 2016-02-14 DIAGNOSIS — D1801 Hemangioma of skin and subcutaneous tissue: Secondary | ICD-10-CM | POA: Diagnosis not present

## 2016-02-14 DIAGNOSIS — L400 Psoriasis vulgaris: Secondary | ICD-10-CM | POA: Diagnosis not present

## 2016-02-19 ENCOUNTER — Ambulatory Visit: Payer: Medicare Other | Admitting: Psychology

## 2016-02-28 ENCOUNTER — Ambulatory Visit (INDEPENDENT_AMBULATORY_CARE_PROVIDER_SITE_OTHER): Payer: 59 | Admitting: Psychology

## 2016-02-28 DIAGNOSIS — F4323 Adjustment disorder with mixed anxiety and depressed mood: Secondary | ICD-10-CM

## 2016-03-04 ENCOUNTER — Ambulatory Visit (INDEPENDENT_AMBULATORY_CARE_PROVIDER_SITE_OTHER): Payer: 59 | Admitting: Psychology

## 2016-03-04 DIAGNOSIS — F4323 Adjustment disorder with mixed anxiety and depressed mood: Secondary | ICD-10-CM | POA: Diagnosis not present

## 2016-03-18 ENCOUNTER — Ambulatory Visit (INDEPENDENT_AMBULATORY_CARE_PROVIDER_SITE_OTHER): Payer: 59 | Admitting: Psychology

## 2016-03-18 DIAGNOSIS — F4323 Adjustment disorder with mixed anxiety and depressed mood: Secondary | ICD-10-CM

## 2016-04-01 ENCOUNTER — Ambulatory Visit: Payer: 59 | Admitting: Psychology

## 2016-04-10 ENCOUNTER — Ambulatory Visit: Payer: 59 | Admitting: Psychology

## 2016-04-13 ENCOUNTER — Telehealth: Payer: Self-pay | Admitting: Family Medicine

## 2016-04-13 DIAGNOSIS — S61219D Laceration without foreign body of unspecified finger without damage to nail, subsequent encounter: Secondary | ICD-10-CM

## 2016-04-13 NOTE — Telephone Encounter (Signed)
Referral faxed to [facility], waiting for appt to be made. Dr Amedeo Plenty too far out to wait for an appt. Patient is aware that they will call her directly to schedule her appt.

## 2016-04-13 NOTE — Telephone Encounter (Signed)
Pt calling requesting referral to Dr Amedeo Plenty for further eval after pinky laceration with concern for persistent granulation tissue. Seen at [facility] last week with stitches which have now been removed.

## 2016-04-14 DIAGNOSIS — D2111 Benign neoplasm of connective and other soft tissue of right upper limb, including shoulder: Secondary | ICD-10-CM | POA: Insufficient documentation

## 2016-04-15 ENCOUNTER — Ambulatory Visit (INDEPENDENT_AMBULATORY_CARE_PROVIDER_SITE_OTHER): Payer: 59 | Admitting: Psychology

## 2016-04-15 ENCOUNTER — Other Ambulatory Visit: Payer: Self-pay | Admitting: Orthopedic Surgery

## 2016-04-15 DIAGNOSIS — F4323 Adjustment disorder with mixed anxiety and depressed mood: Secondary | ICD-10-CM | POA: Diagnosis not present

## 2016-04-16 ENCOUNTER — Ambulatory Visit (INDEPENDENT_AMBULATORY_CARE_PROVIDER_SITE_OTHER): Payer: Medicare Other | Admitting: Family Medicine

## 2016-04-16 ENCOUNTER — Encounter: Payer: Self-pay | Admitting: Family Medicine

## 2016-04-16 VITALS — BP 130/72 | HR 76 | Temp 98.5°F | Wt 158.5 lb

## 2016-04-16 DIAGNOSIS — L980 Pyogenic granuloma: Secondary | ICD-10-CM | POA: Insufficient documentation

## 2016-04-16 DIAGNOSIS — I10 Essential (primary) hypertension: Secondary | ICD-10-CM | POA: Diagnosis not present

## 2016-04-16 DIAGNOSIS — M8580 Other specified disorders of bone density and structure, unspecified site: Secondary | ICD-10-CM

## 2016-04-16 HISTORY — DX: Pyogenic granuloma: L98.0

## 2016-04-16 NOTE — Progress Notes (Signed)
BP 130/72 mmHg  Pulse 76  Temp(Src) 98.5 F (36.9 C) (Oral)  Wt 158 lb 8 oz (71.895 kg)  SpO2 96%   CC: f/u visit  Subjective:    Patient ID: Beth Rodgers, female    DOB: June 03, 1935, 81 y.o.   MRN: [id number]  HPI: Beth Rodgers is a 81 y.o. female presenting on 04/16/2016 for Blood Pressure Check and Medication Concerns   R pinky laceration seen at [facility] s/p stitches - records reviewed through care everywhere. Pt requested referral to hand surgery due to concern for persistent granulation tissue - saw Dr Burney Gauze and eval consistent with pyogenic granuloma along R small finger - rec excision under local anesthesia (pending).   HTN - noticing some elevated readings despite losartan 50mg  nightly. [facility] 140/90s.   Recent holter monitor in [place] (June - December 2016). Pt states she had elevated readings during holter monitor.  She also had foam vein ablation procedure done in [place].   Relevant past medical, surgical, family and social history reviewed and updated as indicated. Interim medical history since our last visit reviewed. Allergies and medications reviewed and updated. Current Outpatient Prescriptions on File Prior to Visit  Medication Sig  . Ascorbic Acid (VITAMIN C CR PO) Take by mouth as directed. Reported on 12/17/2015  . calcipotriene-betamethasone (TACLONEX) ointment Apply 1 application topically daily.    Marland Kitchen CALCIUM CARBONATE PO Take 600 mg by mouth as directed. Reported on 12/17/2015  . CICLOPIROX EX Apply topically as needed. Reported on 12/17/2015  . desonide (DESOWEN) 0.05 % cream Apply topically 2 (two) times daily. Apply to AA.  . fluticasone (FLONASE) 50 MCG/ACT nasal spray Place 2 sprays into both nostrils daily.  . Homeopathic Products (ARNICARE) GEL Apply 1 application topically as directed.    Marland Kitchen losartan (COZAAR) 50 MG tablet Take 50 mg by mouth daily.  . Multiple Vitamin (MULTIVITAMIN) tablet Take 1  tablet by mouth daily. Reported on 12/17/2015  . SYNTHROID 75 MCG tablet TAKE 1 TABLET BY MOUTH EVERY DAY  . tacrolimus (PROTOPIC) 0.1 % ointment Apply 1 application topically 2 (two) times daily.    . travoprost, benzalkonium, (TRAVATAN) 0.004 % ophthalmic solution Place 1 drop into the left eye at bedtime.    Marland Kitchen VITAMIN D, CHOLECALCIFEROL, PO Take 1,000 Units by mouth as directed. Reported on 12/17/2015  . NITROSTAT 0.4 MG SL tablet Reported on 04/16/2016   No current facility-administered medications on file prior to visit.    Review of Systems Per HPI unless specifically indicated in ROS section     Objective:    BP 130/72 mmHg  Pulse 76  Temp(Src) 98.5 F (36.9 C) (Oral)  Wt 158 lb 8 oz (71.895 kg)  SpO2 96%  Wt Readings from Last 3 Encounters:  04/16/16 158 lb 8 oz (71.895 kg)  12/17/15 154 lb (69.854 kg)  12/17/15 154 lb (69.854 kg)    Physical Exam  Constitutional: She appears well-developed and well-nourished. No distress.  HENT:  Mouth/Throat: Oropharynx is clear and moist. No oropharyngeal exudate.  Cardiovascular: Normal rate, regular rhythm, normal heart sounds and intact distal pulses.   No murmur heard. Pulmonary/Chest: Effort normal and breath sounds normal. No respiratory distress. She has no wheezes. She has no rales.  Musculoskeletal: She exhibits no edema.  R fifth digit in bandages wrap  Nursing note and vitals reviewed.      Assessment & Plan:   Problem List Items Addressed This  Visit    Osteopenia    Off bisphosphonate. Continues calcium/vit D daily.      Essential hypertension - Primary    BP in office stable - continue current regimen. Provided with DASH diet handout. Update if elevated readings >150/100.      Pyogenic granuloma    Developed after finger laceration and repair. S/p excision by hand surgery (weingold). Appreciate hand surgery care of patient. Pt very satisfied with care.          Follow up plan: No Follow-up on  file.  Ria Bush, MD

## 2016-04-16 NOTE — Patient Instructions (Addendum)
Su presion se ve bien joy.  Dejeme saber si presion empieza a subir >150/100.   Plan de alimentacin DASH (DASH Eating Plan) DASH es la sigla en ingls de "Enfoques Alimentarios para Detener la Hipertensin". El plan de alimentacin DASH ha demostrado bajar la presin arterial elevada (hipertensin). Los beneficios adicionales para la salud pueden incluir la disminucin del riesgo de diabetes mellitus tipo2, enfermedades cardacas e ictus. Este plan tambin puede ayudar a Horticulturist, commercial. QU DEBO SABER ACERCA DEL PLAN DE ALIMENTACIN DASH? Para el plan de alimentacin DASH, seguir las siguientes pautas generales:  Elija los alimentos con un valor porcentual diario de sodio de menos del 5% (segn figura en la etiqueta del alimento).  Use hierbas o aderezos sin sal, en lugar de sal de mesa o sal marina.  Consulte al mdico o farmacutico antes de usar sustitutos de la sal.  Coma productos con bajo contenido de sodio, cuya etiqueta suele decir "bajo contenido de sodio" o "sin agregado de sal".  Coma alimentos frescos.  Coma ms verduras, frutas y productos lcteos con bajo contenido de [place].  Elija los cereales integrales. Busque la palabra "integral" en Equities trader de la lista de ingredientes.  Elija el pescado y el pollo o el pavo sin piel ms a menudo que las carnes rojas. Limite el consumo de pescado, carne de ave y carne a 6onzas (170g) por Training and development officer.  Limite el consumo de dulces, postres, azcares y bebidas azucaradas.  Elija las grasas saludables para el corazn.  Limite el consumo de queso a 1onza (28g) por Training and development officer.  Consuma ms comida casera y menos de restaurante, de buf y comida rpida.  Limite el consumo de alimentos fritos.  Cocine los alimentos utilizando mtodos que no sean la fritura.  Limite las verduras enlatadas. Si las consume, enjuguelas bien para disminuir el sodio.  Cuando coma en un restaurante, pida que preparen su comida con menos sal o, en lo posible,  sin nada de sal. QU ALIMENTOS PUEDO COMER? Pida ayuda a un nutricionista para conocer las necesidades calricas individuales. Cereales Pan de salvado o integral. Arroz integral. Pastas de salvado o integrales. Quinua, trigo burgol y cereales integrales. Cereales con bajo contenido de sodio. Tortillas de harina de maz o de salvado. Pan de maz integral. Galletas saladas integrales. Galletas con bajo contenido de [place]. Vegetales Verduras frescas o congeladas (crudas, al vapor, asadas o grilladas). Jugos de tomate y verduras con contenido bajo o reducido de sodio. Pasta y salsa de tomate con contenido bajo o [place]. Verduras enlatadas con bajo contenido de sodio o reducido de sodio.  Lambert Mody Lambert Mody frescas, en conserva (en su jugo natural) o frutas congeladas. Carnes y otros productos con protenas Carne de res molida (al 85% o ms [place]), carne de res de animales alimentados con pastos o carne de res sin la grasa. Pollo o pavo sin piel. Carne de pollo o de [place]. Cerdo sin la grasa. Todos los pescados y frutos de mar. Huevos. Porotos, guisantes o lentejas secos. Frutos secos y semillas sin sal. Frijoles enlatados sin sal. Lcteos Productos lcteos con bajo contenido de grasas, como [place] o al 1%, quesos reducidos en grasas o al 2%, ricota con bajo contenido de grasas o [employer], o yogur natural con bajo contenido de [place]. Quesos con contenido bajo o reducido de sodio. Grasas y Naval architect en barra que no contengan grasas trans. [place] y alios para ensaladas livianos o reducidos en grasas (reducidos en sodio). [place]. Aceites de crtamo,  oliva o canola. [place] natural de man o almendra. Otros Palomitas de maz y pretzels sin sal. Los artculos mencionados arriba pueden no ser [employer] de las bebidas o los alimentos recomendados. Comunquese con el nutricionista para conocer ms opciones. QU ALIMENTOS NO SE  RECOMIENDAN? Cereales Pan blanco. Pastas blancas. Arroz blanco. Pan de maz refinado. Bagels y croissants. Galletas saladas que contengan grasas trans. Vegetales Vegetales con crema o fritos. Verduras en [place]. Verduras enlatadas comunes. Pasta y salsa de tomate en lata comunes. Jugos comunes de tomate y de verduras. Lambert Mody Frutas secas. Fruta enlatada en almbar liviano o espeso. Jugo de frutas. Carnes y otros productos con protenas [place] de carne con Lobbyist. Costillas, alas de pollo, tocineta, salchicha, mortadela, salame, chinchulines, tocino, perros calientes, salchichas alemanas y embutidos envasados. Frutos secos y semillas con sal. Frijoles con sal en lata. Lcteos Leche entera o al 2%, crema, mezcla de [place] y crema, y queso crema. Yogur entero o endulzado. Quesos o queso azul con alto contenido de Physicist, medical. Cremas no lcteas y coberturas batidas. Quesos procesados, quesos para untar o cuajadas. Condimentos Sal de cebolla y ajo, sal condimentada, sal de mesa y sal marina. Salsas en lata y envasadas. Salsa Worcestershire. Salsa trtara. Salsa barbacoa. Salsa teriyaki. Salsa de soja, incluso la que tiene contenido reducido de [place]. Salsa de carne. Salsa de pescado. Salsa de [place]. Salsa rosada. Rbano picante. Ketchup y mostaza. Saborizantes y tiernizantes para carne. Caldo en cubitos. Salsa picante. Salsa tabasco. Adobos. Aderezos para tacos. Salsas. Grasas y aceites [place], [place] en barra, [place] de [place], [place], [place] clarificada y Wendee Copp de tocino. Aceites de coco, de palmiste o de palma. Aderezos comunes para ensalada. Otros Pickles y [place]. Palomitas de maz y pretzels con sal. Los artculos mencionados arriba pueden no ser [employer] de las bebidas y los alimentos que se Higher education careers adviser. Comunquese con el nutricionista para obtener ms informacin. DNDE Dolan Amen MS INFORMACIN? [place], del Pulmn y de Herbalist  (National Heart, Lung, and [place]): travelstabloid.com   Esta informacin no tiene Marine scientist el consejo del mdico. Asegrese de hacerle al mdico cualquier pregunta que tenga.   Document Released: 12/03/2011 Document Revised: 01/04/2015 Elsevier Interactive Patient Education [employer].

## 2016-04-16 NOTE — Assessment & Plan Note (Signed)
Developed after finger laceration and repair. S/p excision by hand surgery (weingold). Appreciate hand surgery care of patient. Pt very satisfied with care.

## 2016-04-16 NOTE — Progress Notes (Signed)
Pre visit review using our clinic review tool, if applicable. No additional management support is needed unless otherwise documented below in the visit note. 

## 2016-04-16 NOTE — Assessment & Plan Note (Signed)
Off bisphosphonate. Continues calcium/vit D daily.

## 2016-04-16 NOTE — Assessment & Plan Note (Signed)
BP in office stable - continue current regimen. Provided with DASH diet handout. Update if elevated readings >150/100.

## 2016-04-29 ENCOUNTER — Ambulatory Visit (INDEPENDENT_AMBULATORY_CARE_PROVIDER_SITE_OTHER): Payer: 59 | Admitting: Psychology

## 2016-04-29 DIAGNOSIS — F4323 Adjustment disorder with mixed anxiety and depressed mood: Secondary | ICD-10-CM | POA: Diagnosis not present

## 2016-05-01 ENCOUNTER — Telehealth: Payer: Self-pay

## 2016-05-01 MED ORDER — LOSARTAN POTASSIUM 50 MG PO TABS: 50.0000 mg | ORAL_TABLET | Freq: Every day | ORAL | Status: DC

## 2016-05-01 NOTE — Telephone Encounter (Signed)
Pt left v/m requesting refill losartan to [facility]. Pt was seen 04/16/16; refilled per protocol; left v/m per DPR that refill was done and cb if problems with BP being elevated per 04/16/16 office note. FYI to Dr Darnell Level.

## 2016-05-13 ENCOUNTER — Ambulatory Visit (INDEPENDENT_AMBULATORY_CARE_PROVIDER_SITE_OTHER): Payer: 59 | Admitting: Psychology

## 2016-05-13 DIAGNOSIS — F4323 Adjustment disorder with mixed anxiety and depressed mood: Secondary | ICD-10-CM | POA: Diagnosis not present

## 2016-05-15 ENCOUNTER — Telehealth: Payer: Self-pay | Admitting: Family Medicine

## 2016-05-15 DIAGNOSIS — Z639 Problem related to primary support group, unspecified: Secondary | ICD-10-CM

## 2016-05-15 DIAGNOSIS — F4323 Adjustment disorder with mixed anxiety and depressed mood: Secondary | ICD-10-CM

## 2016-05-15 NOTE — Telephone Encounter (Signed)
Pt called needing to get referral  Pt didn't want to go into detail regard what she needed She wanted marion to call her

## 2016-05-15 NOTE — Telephone Encounter (Signed)
Patient wants a referral to see a Psychiatrist, please refer.

## 2016-05-19 NOTE — Telephone Encounter (Signed)
Referral faxed and sent thru Epic to [place].

## 2016-05-27 ENCOUNTER — Ambulatory Visit (INDEPENDENT_AMBULATORY_CARE_PROVIDER_SITE_OTHER): Payer: 59 | Admitting: Psychology

## 2016-05-27 DIAGNOSIS — F4323 Adjustment disorder with mixed anxiety and depressed mood: Secondary | ICD-10-CM

## 2016-06-08 ENCOUNTER — Telehealth: Payer: Self-pay | Admitting: *Deleted

## 2016-06-08 NOTE — Telephone Encounter (Signed)
Patient called requesting a written script for compression stockings. Patient states that she wants to take the script to a medical supply company in [place] and was told that if she has a written script that insurance will pay for it. Patient stated that the stockings that she has now are real old and needs to be replaced. Patient stated that the script needs to be written for 20-30 compressions. Please call patient when ready for pickup.

## 2016-06-08 NOTE — Telephone Encounter (Signed)
Rx written and in [facility] box. Wrote for knee high - let me know if she wants thigh high.

## 2016-06-08 NOTE — Telephone Encounter (Signed)
Patient notified and said she preferred knee highs. Rx placed up front for pick up.

## 2016-06-10 ENCOUNTER — Ambulatory Visit (INDEPENDENT_AMBULATORY_CARE_PROVIDER_SITE_OTHER): Payer: 59 | Admitting: Psychology

## 2016-06-10 DIAGNOSIS — F4323 Adjustment disorder with mixed anxiety and depressed mood: Secondary | ICD-10-CM

## 2016-06-24 ENCOUNTER — Ambulatory Visit (INDEPENDENT_AMBULATORY_CARE_PROVIDER_SITE_OTHER): Payer: 59 | Admitting: Psychology

## 2016-06-24 DIAGNOSIS — F4323 Adjustment disorder with mixed anxiety and depressed mood: Secondary | ICD-10-CM | POA: Diagnosis not present

## 2016-07-01 ENCOUNTER — Other Ambulatory Visit: Payer: Self-pay | Admitting: Family Medicine

## 2016-07-08 ENCOUNTER — Ambulatory Visit: Payer: 59 | Admitting: Psychology

## 2016-07-10 NOTE — Telephone Encounter (Signed)
Pt left v/m and is out of town and request refill thyroid med; [facility] and filled med; nothing further needed.

## 2016-07-22 ENCOUNTER — Ambulatory Visit (INDEPENDENT_AMBULATORY_CARE_PROVIDER_SITE_OTHER): Payer: 59 | Admitting: Psychology

## 2016-07-22 DIAGNOSIS — F4323 Adjustment disorder with mixed anxiety and depressed mood: Secondary | ICD-10-CM

## 2016-08-05 ENCOUNTER — Ambulatory Visit (INDEPENDENT_AMBULATORY_CARE_PROVIDER_SITE_OTHER): Payer: 59 | Admitting: Psychology

## 2016-08-05 DIAGNOSIS — F4323 Adjustment disorder with mixed anxiety and depressed mood: Secondary | ICD-10-CM | POA: Diagnosis not present

## 2016-08-10 ENCOUNTER — Telehealth: Payer: Self-pay | Admitting: Family Medicine

## 2016-08-10 DIAGNOSIS — E039 Hypothyroidism, unspecified: Secondary | ICD-10-CM

## 2016-08-10 DIAGNOSIS — R5382 Chronic fatigue, unspecified: Secondary | ICD-10-CM

## 2016-08-10 DIAGNOSIS — I10 Essential (primary) hypertension: Secondary | ICD-10-CM

## 2016-08-10 NOTE — Telephone Encounter (Signed)
Pt left message on triage VM.  States she is feeling very tired and weak and currently is on 75 mg of Synthroid.  She has an upcoming appointment on 08/12/16 with PCP and would like to get thyroid labs completed before that appointment so the results will be available to PCP in case of rx change.  Last AWV 11/2015.  Pt requests callback at [phone]

## 2016-08-10 NOTE — Telephone Encounter (Signed)
Pt called again - she is wanting a thyroid test before her appt, so that the results will be back in on her appt date.    She is feeling tired and lethargic cb number is [phone]

## 2016-08-10 NOTE — Telephone Encounter (Signed)
labs ordered. plz schedule lab visit.

## 2016-08-12 ENCOUNTER — Ambulatory Visit (INDEPENDENT_AMBULATORY_CARE_PROVIDER_SITE_OTHER): Payer: Medicare Other | Admitting: Family Medicine

## 2016-08-12 ENCOUNTER — Encounter: Payer: Self-pay | Admitting: Family Medicine

## 2016-08-12 VITALS — BP 116/64 | HR 76 | Temp 98.1°F | Wt 160.5 lb

## 2016-08-12 DIAGNOSIS — I10 Essential (primary) hypertension: Secondary | ICD-10-CM

## 2016-08-12 DIAGNOSIS — E039 Hypothyroidism, unspecified: Secondary | ICD-10-CM | POA: Diagnosis not present

## 2016-08-12 DIAGNOSIS — R5383 Other fatigue: Secondary | ICD-10-CM | POA: Diagnosis not present

## 2016-08-12 LAB — CBC WITH DIFFERENTIAL/PLATELET
BASOS PCT: 0.3 % (ref 0.0–3.0)
Basophils Absolute: 0 10*3/uL (ref 0.0–0.1)
EOS PCT: 2 % (ref 0.0–5.0)
Eosinophils Absolute: 0.1 10*3/uL (ref 0.0–0.7)
HEMATOCRIT: 40.4 % (ref 36.0–46.0)
Hemoglobin: 13.5 g/dL (ref 12.0–15.0)
LYMPHS ABS: 1.4 10*3/uL (ref 0.7–4.0)
LYMPHS PCT: 24.1 % (ref 12.0–46.0)
MCHC: 33.3 g/dL (ref 30.0–36.0)
MCV: 87.3 fl (ref 78.0–100.0)
MONOS PCT: 10.8 % (ref 3.0–12.0)
Monocytes Absolute: 0.6 10*3/uL (ref 0.1–1.0)
NEUTROS ABS: 3.6 10*3/uL (ref 1.4–7.7)
NEUTROS PCT: 62.8 % (ref 43.0–77.0)
PLATELETS: 205 10*3/uL (ref 150.0–400.0)
RBC: 4.63 Mil/uL (ref 3.87–5.11)
RDW: 14 % (ref 11.5–15.5)
WBC: 5.8 10*3/uL (ref 4.0–10.5)

## 2016-08-12 LAB — BASIC METABOLIC PANEL
BUN: 16 mg/dL (ref 6–23)
CALCIUM: 9.3 mg/dL (ref 8.4–10.5)
CHLORIDE: 105 meq/L (ref 96–112)
CO2: 29 mEq/L (ref 19–32)
CREATININE: 0.67 mg/dL (ref 0.40–1.20)
GFR: 90.18 mL/min (ref >60.00)
Glucose, Bld: 65 mg/dL — ABNORMAL LOW (ref 70–99)
Potassium: 4 mEq/L (ref 3.5–5.1)
Sodium: 140 mEq/L (ref 135–145)

## 2016-08-12 LAB — VITAMIN D 25 HYDROXY (VIT D DEFICIENCY, FRACTURES): VITD: 22.68 ng/mL — AB (ref 30.00–100.00)

## 2016-08-12 LAB — TSH: TSH: 1.1 u[IU]/mL (ref 0.35–4.50)

## 2016-08-12 LAB — VITAMIN B12: VITAMIN B 12: 902 pg/mL (ref 211–911)

## 2016-08-12 NOTE — Progress Notes (Signed)
BP 116/64   Pulse 76   Temp 98.1 F (36.7 C) (Oral)   Wt 160 lb 8 oz (72.8 kg)   BMI 27.99 kg/m    CC: fatigue Subjective:    Patient ID: Beth Rodgers, female    DOB: 1935-08-22, 81 y.o.   MRN: [id number]  HPI: Beth Rodgers is a 81 y.o. female presenting on 08/12/2016 for No energy   Noticing increased fatigue over last 3 weeks - having to take naps during the day due to fatigue. Some intermittent instability on feet. Endorses restorative sleeping. Wakes up feeling rested.   No dizziness, vertigo, dyspnea, chest pain, palpitations.    Upcoming 2 wk trip to [facility] 09/04/2016.   Relevant past medical, surgical, family and social history reviewed and updated as indicated. Interim medical history since our last visit reviewed. Allergies and medications reviewed and updated. Current Outpatient Prescriptions on File Prior to Visit  Medication Sig  . Ascorbic Acid (VITAMIN C CR PO) Take by mouth as directed. Reported on 12/17/2015  . calcipotriene-betamethasone (TACLONEX) ointment Apply 1 application topically daily.    Marland Kitchen CALCIUM CARBONATE PO Take 600 mg by mouth as directed. Reported on 12/17/2015  . CICLOPIROX EX Apply topically as needed. Reported on 12/17/2015  . desonide (DESOWEN) 0.05 % cream Apply topically 2 (two) times daily. Apply to AA.  . fluticasone (FLONASE) 50 MCG/ACT nasal spray Place 2 sprays into both nostrils daily.  . Homeopathic Products (ARNICARE) GEL Apply 1 application topically as directed.    Marland Kitchen losartan (COZAAR) 50 MG tablet Take 1 tablet (50 mg total) by mouth daily.  . Multiple Vitamin (MULTIVITAMIN) tablet Take 1 tablet by mouth daily. Reported on 12/17/2015  . NITROSTAT 0.4 MG SL tablet Reported on 04/16/2016  . SYNTHROID 75 MCG tablet TAKE 1 TABLET BY MOUTH EVERY DAY  . tacrolimus (PROTOPIC) 0.1 % ointment Apply 1 application topically 2 (two) times daily.    . travoprost, benzalkonium, (TRAVATAN) 0.004 % ophthalmic solution Place 1 drop into both eyes at  bedtime.   Marland Kitchen VITAMIN D, CHOLECALCIFEROL, PO Take 1,000 Units by mouth as directed. Reported on 12/17/2015   No current facility-administered medications on file prior to visit.     Review of Systems Per HPI unless specifically indicated in ROS section     Objective:    BP 116/64   Pulse 76   Temp 98.1 F (36.7 C) (Oral)   Wt 160 lb 8 oz (72.8 kg)   BMI 27.99 kg/m   Wt Readings from Last 3 Encounters:  08/12/16 160 lb 8 oz (72.8 kg)  04/16/16 158 lb 8 oz (71.9 kg)  12/17/15 154 lb (69.9 kg)    Physical Exam  Constitutional: She appears well-developed and well-nourished. No distress.  HENT:  Mouth/Throat: Oropharynx is clear and moist. No oropharyngeal exudate.  Neck: No thyromegaly present.  Cardiovascular: Normal rate, regular rhythm, normal heart sounds and intact distal pulses.   No murmur heard. Pulmonary/Chest: Effort normal and breath sounds normal. No respiratory distress. She has no wheezes. She has no rales.  Musculoskeletal: She exhibits no edema.  Skin: Skin is warm and dry. No rash noted.  Nursing note and vitals reviewed.  Lab Results  Component Value Date   TSH 1.83 12/10/2015       Assessment & Plan:   Problem List Items Addressed This Visit    Essential hypertension   Hypothyroidism   Other fatigue - Primary    Fatigue over last 3 wks. Check  labs for reversible causes of fatigue. Pt agrees with plan. Not consistent with OSA.       Relevant Orders   Vitamin B12   VITAMIN D 25 Hydroxy (Vit-D Deficiency, Fractures)    Other Visit Diagnoses   None.      Follow up plan: Return if symptoms worsen or fail to improve.  Ria Bush, MD

## 2016-08-12 NOTE — Patient Instructions (Addendum)
[facility] - la llamaremos con [place]. Gusto verla hoy. Buen viaje a new [place]!

## 2016-08-12 NOTE — Progress Notes (Signed)
Pre visit review using our clinic review tool, if applicable. No additional management support is needed unless otherwise documented below in the visit note. 

## 2016-08-12 NOTE — Assessment & Plan Note (Addendum)
Fatigue over last 3 wks. Check labs for reversible causes of fatigue. Pt agrees with plan. Not consistent with OSA.

## 2016-08-14 ENCOUNTER — Other Ambulatory Visit: Payer: Self-pay | Admitting: Family Medicine

## 2016-08-14 ENCOUNTER — Encounter: Payer: Self-pay | Admitting: *Deleted

## 2016-08-14 MED ORDER — VITAMIN D 50 MCG (2000 UT) PO CAPS: 1.0000 | ORAL_CAPSULE | Freq: Every day | ORAL | Status: DC

## 2016-09-14 ENCOUNTER — Encounter: Payer: Self-pay | Admitting: Family Medicine

## 2016-09-30 ENCOUNTER — Ambulatory Visit: Payer: 59 | Admitting: Psychology

## 2016-10-09 ENCOUNTER — Telehealth: Payer: Self-pay

## 2016-10-09 NOTE — Telephone Encounter (Signed)
Pt is scheduled for CPX labs on 10/12/16. Pt wants to know if needs more labs since BMP, CBC with diff, Vit D and B12 done 08/12/16. Pt request cb.

## 2016-10-11 ENCOUNTER — Other Ambulatory Visit: Payer: Self-pay | Admitting: Family Medicine

## 2016-10-11 DIAGNOSIS — E559 Vitamin D deficiency, unspecified: Secondary | ICD-10-CM

## 2016-10-11 DIAGNOSIS — I10 Essential (primary) hypertension: Secondary | ICD-10-CM

## 2016-10-11 NOTE — Telephone Encounter (Signed)
Yes - want to check lipids and recheck vit D. Thanks.

## 2016-10-12 ENCOUNTER — Other Ambulatory Visit (INDEPENDENT_AMBULATORY_CARE_PROVIDER_SITE_OTHER): Payer: Medicare Other

## 2016-10-12 DIAGNOSIS — E559 Vitamin D deficiency, unspecified: Secondary | ICD-10-CM

## 2016-10-12 DIAGNOSIS — I10 Essential (primary) hypertension: Secondary | ICD-10-CM | POA: Diagnosis not present

## 2016-10-12 LAB — LIPID PANEL
CHOLESTEROL: 194 mg/dL (ref 0–200)
HDL: 65.2 mg/dL (ref >39.00)
LDL Cholesterol: 120 mg/dL — ABNORMAL HIGH (ref 0–99)
NonHDL: 128.93
Total CHOL/HDL Ratio: 3
Triglycerides: 47 mg/dL (ref 0.0–149.0)
VLDL: 9.4 mg/dL (ref 0.0–40.0)

## 2016-10-12 LAB — VITAMIN D 25 HYDROXY (VIT D DEFICIENCY, FRACTURES): VITD: 30.84 ng/mL (ref 30.00–100.00)

## 2016-10-12 NOTE — Telephone Encounter (Signed)
Called patient and was advised that someone had already advised her and she has had the blood work done.

## 2016-10-14 ENCOUNTER — Ambulatory Visit (INDEPENDENT_AMBULATORY_CARE_PROVIDER_SITE_OTHER): Payer: 59 | Admitting: Psychology

## 2016-10-14 DIAGNOSIS — F4323 Adjustment disorder with mixed anxiety and depressed mood: Secondary | ICD-10-CM | POA: Diagnosis not present

## 2016-10-16 ENCOUNTER — Encounter: Payer: Self-pay | Admitting: Family Medicine

## 2016-10-23 ENCOUNTER — Ambulatory Visit (INDEPENDENT_AMBULATORY_CARE_PROVIDER_SITE_OTHER): Payer: Medicare Other | Admitting: Family Medicine

## 2016-10-23 ENCOUNTER — Encounter: Payer: Self-pay | Admitting: Family Medicine

## 2016-10-23 VITALS — BP 122/78 | HR 84 | Temp 98.2°F | Ht 63.5 in | Wt 159.5 lb

## 2016-10-23 DIAGNOSIS — Z7189 Other specified counseling: Secondary | ICD-10-CM | POA: Diagnosis not present

## 2016-10-23 DIAGNOSIS — Z639 Problem related to primary support group, unspecified: Secondary | ICD-10-CM

## 2016-10-23 DIAGNOSIS — Z0000 Encounter for general adult medical examination without abnormal findings: Secondary | ICD-10-CM | POA: Insufficient documentation

## 2016-10-23 DIAGNOSIS — Z1211 Encounter for screening for malignant neoplasm of colon: Secondary | ICD-10-CM

## 2016-10-23 DIAGNOSIS — M8580 Other specified disorders of bone density and structure, unspecified site: Secondary | ICD-10-CM | POA: Diagnosis not present

## 2016-10-23 DIAGNOSIS — I10 Essential (primary) hypertension: Secondary | ICD-10-CM

## 2016-10-23 DIAGNOSIS — E039 Hypothyroidism, unspecified: Secondary | ICD-10-CM | POA: Diagnosis not present

## 2016-10-23 DIAGNOSIS — I872 Venous insufficiency (chronic) (peripheral): Secondary | ICD-10-CM

## 2016-10-23 NOTE — Assessment & Plan Note (Signed)
Previously on bisphosphonates as well as evista.  DEXA stable in osteopenia range. Reviewed recommended daily calcium/vit D intake, pt will try to get most calcium in diet.

## 2016-10-23 NOTE — Assessment & Plan Note (Signed)
Ongoing family stressor with bipolar son.

## 2016-10-23 NOTE — Progress Notes (Signed)
Pre visit review using our clinic review tool, if applicable. No additional management support is needed unless otherwise documented below in the visit note. 

## 2016-10-23 NOTE — Assessment & Plan Note (Signed)
Chronic, stable. Continue synthroid. 

## 2016-10-23 NOTE — Assessment & Plan Note (Signed)
Chronic, stable. Continue losartan.  

## 2016-10-23 NOTE — Assessment & Plan Note (Addendum)
Advanced directives: packet received 3 yrs ago, did not review. She still has this at home. Will review. Would want husband to be HCPOA.

## 2016-10-23 NOTE — Progress Notes (Signed)
BP 122/78   Pulse 84   Temp 98.2 F (36.8 C) (Oral)   Ht 5' 3.5" (1.613 m)   Wt 159 lb 8 oz (72.3 kg)   BMI 27.81 kg/m    CC: CPE Subjective:    Patient ID: [name], female    DOB: 1935/01/14, 81 y.o.   MRN: [id number]  HPI: JOBIE PETROSYAN is a 81 y.o. female presenting on 10/23/2016 for Annual Exam   Denies depression, anhedonia  No falls in last year.  Preventative: Colon cancer screening - declines colonoscopy, stool kits normal to date. Rpt today. Mammogram at [facility] 11/2015 WNL  Well woman with [facility] 11/2015 WNL. DEXA 11/2015: T -2.3 hip, -2.2 spine, on longterm bisphosphonate/evista.  Flu shot at [facility].  Td 04/2012  Pneumovax 10/2012. prevnar 2015. zostavax 06/2014. Advanced directives: packet received 3 yrs ago, did not review. She still has this at home. Will review. Would want husband to be HCPOA.  Seat belt use discussed Sunscreen use discussed. Denies changing moles.   Caffeine: 2-3 cups/day  Lives with husband, majority of time alone as he travels to [place].  Has grandchildren nearby  Occ: Field seismologist, Metallurgist  Activity: bed exercises, limiting walking  Diet: does get fruits and vegetables, only some water   Relevant past medical, surgical, family and social history reviewed and updated as indicated. Interim medical history since our last visit reviewed. Allergies and medications reviewed and updated. Current Outpatient Prescriptions on File Prior to Visit  Medication Sig  . Ascorbic Acid (VITAMIN C CR PO) Take by mouth as directed. Reported on 12/17/2015  . calcipotriene-betamethasone (TACLONEX) ointment Apply 1 application topically daily.    Marland Kitchen CALCIUM CARBONATE PO Take 600 mg by mouth as directed. Reported on 12/17/2015  . Cholecalciferol (VITAMIN D) 2000 units CAPS Take 1 capsule (2,000 Units total) by mouth daily.  Marland Kitchen CICLOPIROX EX Apply topically as needed. Reported on 12/17/2015  . desonide (DESOWEN) 0.05 % cream Apply topically 2  (two) times daily. Apply to AA.  . fluticasone (FLONASE) 50 MCG/ACT nasal spray Place 2 sprays into both nostrils daily.  . Homeopathic Products (ARNICARE) GEL Apply 1 application topically as directed.    Marland Kitchen losartan (COZAAR) 50 MG tablet Take 1 tablet (50 mg total) by mouth daily.  . Multiple Vitamin (MULTIVITAMIN) tablet Take 1 tablet by mouth daily. Reported on 12/17/2015  . NITROSTAT 0.4 MG SL tablet Reported on 04/16/2016  . SYNTHROID 75 MCG tablet TAKE 1 TABLET BY MOUTH EVERY DAY  . tacrolimus (PROTOPIC) 0.1 % ointment Apply 1 application topically 2 (two) times daily.    . travoprost, benzalkonium, (TRAVATAN) 0.004 % ophthalmic solution Place 1 drop into both eyes at bedtime.    No current facility-administered medications on file prior to visit.     Review of Systems  Constitutional: Negative for activity change, appetite change, chills, fatigue, fever and unexpected weight change.  HENT: Negative for hearing loss.   Eyes: Negative for visual disturbance.  Respiratory: Negative for cough, chest tightness, shortness of breath and wheezing.   Cardiovascular: Negative for chest pain, palpitations and leg swelling.  Gastrointestinal: Negative for abdominal distention, abdominal pain, blood in stool, constipation, diarrhea, nausea and vomiting.  Genitourinary: Negative for difficulty urinating and hematuria.  Musculoskeletal: Negative for arthralgias, myalgias and neck pain.  Skin: Negative for rash.  Neurological: Negative for dizziness, seizures, syncope and headaches.  Hematological: Negative for adenopathy. Does not bruise/bleed easily.  Psychiatric/Behavioral: Negative for dysphoric mood. The patient  is not nervous/anxious.    Per HPI unless specifically indicated in ROS section     Objective:    BP 122/78   Pulse 84   Temp 98.2 F (36.8 C) (Oral)   Ht 5' 3.5" (1.613 m)   Wt 159 lb 8 oz (72.3 kg)   BMI 27.81 kg/m   Wt Readings from Last 3 Encounters:  10/23/16 159 lb 8  oz (72.3 kg)  08/12/16 160 lb 8 oz (72.8 kg)  04/16/16 158 lb 8 oz (71.9 kg)    Physical Exam  Constitutional: She is oriented to person, place, and time. She appears well-developed and well-nourished. No distress.  HENT:  Head: Normocephalic and atraumatic.  Right Ear: Hearing, tympanic membrane, external ear and ear canal normal.  Left Ear: Hearing, tympanic membrane, external ear and ear canal normal.  Nose: Nose normal.  Mouth/Throat: Uvula is midline, oropharynx is clear and moist and mucous membranes are normal. No oropharyngeal exudate, posterior oropharyngeal edema or posterior oropharyngeal erythema.  Eyes: Conjunctivae and EOM are normal. Pupils are equal, round, and reactive to light. No scleral icterus.  Neck: Normal range of motion. Neck supple. No thyromegaly present.  Cardiovascular: Normal rate, regular rhythm, normal heart sounds and intact distal pulses.   No murmur heard. Pulses:      Radial pulses are 2+ on the right side, and 2+ on the left side.  Pulmonary/Chest: Effort normal and breath sounds normal. No respiratory distress. She has no wheezes. She has no rales.  Abdominal: Soft. Bowel sounds are normal. She exhibits no distension and no mass. There is no tenderness. There is no rebound and no guarding.  Musculoskeletal: Normal range of motion. She exhibits no edema.  Lymphadenopathy:    She has no cervical adenopathy.  Neurological: She is alert and oriented to person, place, and time.  CN grossly intact, station and gait intact  Skin: Skin is warm and dry. No rash noted.  Psychiatric: She has a normal mood and affect. Her behavior is normal. Judgment and thought content normal.  Nursing note and vitals reviewed.  Results for orders placed or performed in visit on 10/12/16  VITAMIN D 25 Hydroxy (Vit-D Deficiency, Fractures)  Result Value Ref Range   VITD 30.84 30.00 - 100.00 ng/mL  Lipid panel  Result Value Ref Range   Cholesterol 194 0 - 200 mg/dL    Triglycerides 47.0 0.0 - 149.0 mg/dL   HDL 65.20 >39.00 mg/dL   VLDL 9.4 0.0 - 40.0 mg/dL   LDL Cholesterol 120 (H) 0 - 99 mg/dL   Total CHOL/HDL Ratio 3    NonHDL 128.93       Assessment & Plan:   Problem List Items Addressed This Visit    Advanced care planning/counseling discussion    Advanced directives: packet received 3 yrs ago, did not review. She still has this at home. Will review. Would want husband to be HCPOA.       Chronic venous insufficiency    Recent [facility] evaluation up [place] - recommended against further interventions.       Difficulty with family    Ongoing family stressor with bipolar son.      Essential hypertension    Chronic, stable. Continue losartan.       Health maintenance examination - Primary    Preventative protocols reviewed and updated unless pt declined. Discussed healthy diet and lifestyle.       Hypothyroidism    Chronic, stable. Continue synthroid.  Osteopenia    Previously on bisphosphonates as well as evista.  DEXA stable in osteopenia range. Reviewed recommended daily calcium/vit D intake, pt will try to get most calcium in diet.       Other Visit Diagnoses   None.      Follow up plan: Return in about 1 year (around 10/23/2017), or as needed, for medicare wellness visit.  Ria Bush, MD

## 2016-10-23 NOTE — Patient Instructions (Addendum)
Pass by lab to pick up stool kit. Look into five wishes advanced directives online.  Gusto verla hoy. Llamenos con preguntas. 1 ao para proximo examen fisico   Mantenimiento de la salud - Mujeres (Health Maintenance, Female) Un estilo de vida saludable y los cuidados preventivos pueden favorecer considerablemente a la salud y Musician. Pregunte a su mdico cul es el cronograma de exmenes peridicos apropiado para usted. Esta es una buena oportunidad para consultarlo sobre cmo prevenir enfermedades y [place] sano. Adems de los controles, hay muchas otras cosas que puede hacer usted mismo. Los expertos han realizado numerosas investigaciones [employer]itor cambios en el estilo de vida y las medidas de prevencin que, [place], lo ayudarn a mantenerse sano. Solicite a su mdico ms informacin. EL PESO Y LA DIETA  Consuma una dieta saludable.  Asegrese de [employer] verduras, frutas, productos lcteos de bajo contenido de [place] y Advertising account planner.  No consuma muchos alimentos de alto contenido de grasas slidas, azcares agregados o sal.  Realice actividad fsica con regularidad. Esta es una de las prcticas ms importantes que puede hacer por su salud.  La mayora de los adultos deben hacer ejercicio durante al menos 155mnutos por semana. El ejercicio debe aumentar la frecuencia cardaca y pActorla transpiracin (ejercicio de iLyman.  La mayora de los adultos tambin deben hField seismologistejercicios de elongacin al mToysRusveces a la semana. Agregue esto al su plan de ejercicio de intensidad moderada. Mantenga un peso saludable.  El ndice de masa corporal ([facility] es una medida que puede utilizarse para identificar posibles problemas de pDufur Proporciona una estimacin de la grasa corporal basndose en el peso y la altura. Su mdico puede ayudarle a dRadiation protection practitionerIHaywardy a lScientist, forensico mTheatre managerun peso saludable.  Para las mujeres de 20aos o ms:  Un IH[facility]dale Community Health Centermenor de  18,5 se considera bajo peso.  Un IPalmetto General Hospitalentre 18,5 y 24,9 es normal.  Un ISurgical Care Center Incentre 25 y 29,9 se considera sobrepeso.  Un IMC de 30 o ms se considera obesidad. Observe los niveles de colesterol y lpidos en la sangre.  Debe comenzar a rEnglish as a second language teacherde lpidos y cResearch officer, trade unionen la sangre a los 20aos y luego repetirlos cada 538aos  Es posible que nAutomotive engineerlos niveles de colesterol con mayor frecuencia si:  Sus niveles de lpidos y colesterol son altos.  Es mayor de 50aos.  Presenta un alto riesgo de padecer enfermedades cardacas. DETECCIN DE CNCER  Cncer de pulmn  Se recomienda realizar exmenes de deteccin de cncer de pulmn a personas adultas entre 516y 864aos que estn en riesgo de dHorticulturist, commercialde pulmn por sus antecedentes de consumo de tabaco.  Se recomienda una tomografa computarizada de baja dosis de los pLiberty Mediaaos a las personas que:  Fuman actualmente.  Hayan dejado el hbito en algn momento en los ltimos 15aos.  Hayan fumado durante 30aos un paquete diario. Un paquete-ao equivale a fumar un promedio de un paquete de cigarrillos diario durante un ao.  Los exmenes de deteccin anuales deben continuar hasta que hayan pasado 15aos desde que dej de fumar.  Ya no debern realizarse si tiene un problema de salud que le impida recibir tratamiento para eScience writerde pulmn. Cncer de mama  Practique la autoconciencia de la mama. Esto significa reconocer la apariencia normal de sus mamas y cmo las siente.  Tambin significa realizar autoexmenes regulares de lJohnson & Johnson Informe a su mdico sobre cCSX Corporation sin  importar cun pequeo sea.  Si tiene entre 20 y 81 aos, un mdico debe realizarle un examen clnico de las mamas como parte del examen regular de [place], cada 1 a 3aos.  Si tiene 40aos o ms, debe Information systems manager clnico de las Microsoft. Tambin considere realizarse una [place]) todos los [place].  Si tiene antecedentes familiares de cncer de mama, hable con su mdico para someterse a un estudio gentico.  Si tiene alto riesgo de Chief Financial Officer de mama, hable con su mdico para someterse a Public house manager y [employer].  La evaluacin del gen del cncer de mama (BRCA) se recomienda a mujeres que tengan familiares con cnceres relacionados con el BRCA. Los cnceres relacionados con el BRCA incluyen los siguientes:  Mama.  Ovario.  Trompas.  Cnceres de peritoneo.  Los resultados de la evaluacin determinarn la necesidad de asesoramiento gentico y de [place] de BRCA1 y BRCA2. Cncer de cuello del tero El mdico puede recomendarle que se haga pruebas peridicas de deteccin de cncer de los rganos de la pelvis (ovarios, tero y vagina). Estas pruebas incluyen un examen plvico, que abarca controlar si se produjeron cambios microscpicos en la superficie del cuello del tero (prueba de Papanicolaou). Pueden recomendarle que se haga estas pruebas cada 3aos, a partir de los 21aos.  A las mujeres que tienen entre 30 y 10aos, los mdicos pueden recomendarles que se sometan a exmenes plvicos y pruebas de Papanicolaou cada 49aos, o a la prueba de Papanicolaou y el examen plvico en combinacin con estudios de deteccin del virus del papiloma humano (VPH) cada 5aos. Algunos tipos de VPH aumentan el riesgo de Chief Financial Officer de cuello del tero. La prueba para la deteccin del VPH tambin puede realizarse a mujeres de cualquier edad cuyos resultados de la prueba de Papanicolaou no sean claros.  Es posible que otros mdicos no recomienden exmenes de deteccin a mujeres no embarazadas que se consideran sujetos de bajo riesgo de Chief Financial Officer de pelvis y que no tienen sntomas. Pregntele al mdico si un examen plvico de deteccin es adecuado para usted.  Si ha recibido un tratamiento para Science writer cervical o una  enfermedad que podra causar cncer, necesitar realizarse una prueba de Papanicolaou y controles durante al menos 62 aos de concluido el [place]. Si no se ha hecho el Papanicolaou con regularidad, debern volver a evaluarse los factores de riesgo (como tener un nuevo compaero sexual), para Teacher, adult education si debe realizarse los estudios nuevamente. Algunas mujeres sufren problemas mdicos que aumentan la probabilidad de Museum/gallery curator cncer de cuello del tero. En estos casos, el mdico podr [employer] se realicen controles y pruebas de Papanicolaou con ms frecuencia. Cncer colorrectal  Este tipo de cncer puede detectarse y a menudo prevenirse.  Por lo general, los estudios de rutina se deben Medical laboratory scientific officer a Field seismologist a Proofreader de los 76 aos y [place] 74 aos.  Sin embargo, el mdico podr aconsejarle que lo haga antes, si tiene factores de riesgo para el cncer de colon.  Tambin puede recomendarle que use un kit de prueba para Hydrologist en la materia fecal.  Es posible que se use una pequea cmara en el extremo de un tubo para examinar directamente el colon (sigmoidoscopia o colonoscopia) a fin de Hydrographic surveyor formas tempranas de cncer colorrectal.  Los exmenes de rutina generalmente comienzan a los 37aos.  El examen directo del colon se debe repetir cada 5 a 10aos  hasta los 75aos. Sin embargo, es posible que se realicen exmenes con mayor frecuencia, si se detectan formas tempranas de plipos precancerosos o pequeos bultos. Cncer de piel  Revise la piel de la cabeza a los pies con regularidad.  Informe a su mdico si aparecen nuevos lunares o los que tiene se modifican, especialmente en su forma y color.  Tambin notifique al mdico si tiene un lunar que es ms grande que el tamao de una goma de lpiz.  Siempre use pantalla solar. Aplique pantalla solar de [place] y repetida a lo largo del Training and development officer.  Protjase usando mangas y [employer], un sombrero de ala ancha y  gafas para el sol, siempre que se encuentre en el exterior. ENFERMEDADES CARDACAS, DIABETES E HIPERTENSIN ARTERIAL   La hipertensin arterial causa enfermedades cardacas y [place] el riesgo de ictus. La hipertensin arterial es ms probable en los siguientes casos:  Las personas que tienen la presin arterial en el extremo del rango normal (100-139/85-89 mm Hg).  Las personas con sobrepeso u obesidad.  Las Retail banker.  Si usted tiene entre 18 y 39 aos, debe medirse la presin arterial cada 3 a 5 aos. Si usted tiene 40 aos o ms, debe medirse la presin arterial [employer]lett[employer]. Debe medirse la presin arterial dos veces: una vez cuando est en un hospital o una clnica y la otra vez cuando est en otro sitio. Registre el promedio de [employer]. Para controlar su presin arterial cuando no est en un hospital o [place], puede usar lo siguiente:  Ardelia Mems mquina automtica para medir la presin arterial en una farmacia.  Un monitor para medir la presin arterial en el hogar.  Si tiene entre 74 y 2 aos, consulte a su mdico si debe tomar aspirina para prevenir el ictus.  Realcese exmenes de deteccin de la diabetes con regularidad. Esto incluye la toma de [place] de sangre para controlar el nivel de azcar en la sangre durante el [place].  Si tiene un peso normal y un bajo riesgo de padecer diabetes, realcese este anlisis cada tres aos despus de los 45aos.  Si tiene sobrepeso y un alto riesgo de padecer diabetes, considere someterse a este anlisis antes o con mayor frecuencia. PREVENCIN DE INFECCIONES  HepatitisB  Si tiene un riesgo ms alto de Museum/gallery curator hepatitis B, debe someterse a un examen de deteccin de este virus. Se considera que tiene un alto riesgo de Museum/gallery curator hepatitis B si:  Naci en un pas donde la hepatitis B es frecuente. Pregntele a su mdico qu pases son considerados de Public affairs consultant.  Sus padres nacieron en un pas de alto riesgo  y usted no recibi una vacuna que lo proteja contra la hepatitis B (vacuna contra la hepatitis B).  [place].  [place] agujas para inyectarse drogas.  Vive con alguien que tiene hepatitis B.  Ha tenido sexo con alguien que tiene hepatitis B.  Recibe tratamiento de hemodilisis.  Toma ciertos medicamentos para el cncer, trasplante de rganos y afecciones autoinmunitarias. Hepatitis C  Se recomienda un anlisis de [place] para:  Todos los que nacieron entre 1945 y [phone].  Todas las personas que tengan un riesgo de haber contrado hepatitis C. Enfermedades de transmisin sexual (ETS).  Debe realizarse pruebas de deteccin de enfermedades de transmisin sexual (ETS), incluidas gonorrea y clamidia si:  Es sexualmente activo y es menor de 24aos.  Es mayor de 24aos, y Investment banker, operational informa que corre riesgo de Best boy  este tipo de infecciones.  La actividad sexual ha cambiado desde que le hicieron la ltima prueba de deteccin y tiene un riesgo mayor de Best boy clamidia o Radio broadcast assistant. Pregntele al mdico si usted tiene riesgo.  Si no tiene el VIH, pero corre riesgo de infectarse por el virus, se recomienda tomar diariamente un medicamento recetado para evitar la infeccin. Esto se conoce como profilaxis previa a la exposicin. Se considera que est en riesgo si:  Es [place] sexualmente y no [place] preservativos habitualmente o no conoce el estado del VIH de sus Advertising copywriter.  Se inyecta drogas.  Es [place] sexualmente con Ardelia Mems pareja que tiene VIH. Consulte a su mdico para saber si tiene un alto riesgo de infectarse por el VIH. Si opta por comenzar la profilaxis previa a la exposicin, primero debe realizarse anlisis de deteccin del VIH. Luego, le harn anlisis cada 36mses mientras est tomando los medicamentos para la profilaxis previa a la exposicin.  [facility]  Si es premenopusica y puede quedar eHemingway solicite a su mdico asesoramiento previo a la concepcin.  Si puede  quedar embarazada, tome 400 a [id number](mcg) de cido fAnheuser-Busch  Si desea evitar el embarazo, hable con su mdico sobre el control de la natalidad (anticoncepcin). OSTEOPOROSIS Y MENOPAUSIA   La osteoporosis es una enfermedad en la que los huesos pierden los minerales y la fuerza por el avance de la edad. El resultado pueden ser fracturas graves en los hJarrell El riesgo de osteoporosis puede identificarse con uArdelia Memsprueba de densidad sea.  Si tiene 65aos o ms, o si est en riesgo de sufrir osteoporosis y fracturas, pregunte a su mdico si debe someterse a exmenes.  Consulte a su mdico si debe tomar un suplemento de calcio o de vitamina D para reducir el riesgo de osteoporosis.  La menopausia puede presentar ciertos sntomas fsicos y rGaffer  La terapia de reemplazo hormonal puede reducir algunos de estos sntomas y rGaffer Consulte a su mdico para saber si la terapia de reemplazo hormonal es conveniente para usted.  INSTRUCCIONES PARA EL CUIDADO EN EL HOGAR   Realcese los estudios de rutina de la salud, dentales y de lPublic librarian  MDumbarton  No consuma ningn producto que contenga tabaco, lo que incluye cigarrillos, tabaco de mHigher education careers advisero cPsychologist, sport and exercise  Si est embarazada, no beba alcohol.  Si est amamantando, reduzca el consumo de alcohol y la frecuencia con la que consume.  Si es mujer y no est embarazada limite el consumo de alcohol a no ms de 1 medida por da. Una medida equivale a 12onzas de cerveza, 5onzas de vino o 1onzas de bebidas alcohlicas de alta graduacin.  No consuma drogas.  No comparta agujas.  Solicite ayuda a su mdico si necesita apoyo o informacin para abandonar las drogas.  Informe a su mdico si a menudo se siente deprimido.  Notifique a su mdico si alguna vez ha sido vctima de abuso o si no se siente seguro en su hogar.   Esta informacin no tiene cMarine scientistel consejo del  mdico. Asegrese de hacerle al mdico cualquier pregunta que tenga.   Document Released: 12/03/2011 Document Revised: 01/04/2015 Elsevier Interactive Patient Education 2[employer]

## 2016-10-23 NOTE — Assessment & Plan Note (Signed)
Preventative protocols reviewed and updated unless pt declined. Discussed healthy diet and lifestyle.  

## 2016-10-23 NOTE — Assessment & Plan Note (Signed)
Recent [facility] evaluation up [place] - recommended against further interventions.

## 2016-10-28 ENCOUNTER — Ambulatory Visit (INDEPENDENT_AMBULATORY_CARE_PROVIDER_SITE_OTHER): Payer: 59 | Admitting: Psychology

## 2016-10-28 DIAGNOSIS — F4323 Adjustment disorder with mixed anxiety and depressed mood: Secondary | ICD-10-CM

## 2016-10-30 ENCOUNTER — Other Ambulatory Visit: Payer: Self-pay | Admitting: Family Medicine

## 2016-11-03 ENCOUNTER — Other Ambulatory Visit (INDEPENDENT_AMBULATORY_CARE_PROVIDER_SITE_OTHER): Payer: Medicare Other

## 2016-11-03 DIAGNOSIS — Z1211 Encounter for screening for malignant neoplasm of colon: Secondary | ICD-10-CM

## 2016-11-03 LAB — FECAL OCCULT BLOOD, GUAIAC: FECAL OCCULT BLD: NEGATIVE

## 2016-11-03 LAB — FECAL OCCULT BLOOD, IMMUNOCHEMICAL: Fecal Occult Bld: NEGATIVE

## 2016-11-04 ENCOUNTER — Encounter: Payer: Self-pay | Admitting: *Deleted

## 2016-11-11 ENCOUNTER — Ambulatory Visit (INDEPENDENT_AMBULATORY_CARE_PROVIDER_SITE_OTHER): Payer: 59 | Admitting: Psychology

## 2016-11-11 DIAGNOSIS — F4323 Adjustment disorder with mixed anxiety and depressed mood: Secondary | ICD-10-CM

## 2016-11-25 ENCOUNTER — Ambulatory Visit (INDEPENDENT_AMBULATORY_CARE_PROVIDER_SITE_OTHER): Payer: 59 | Admitting: Psychology

## 2016-11-25 DIAGNOSIS — F4323 Adjustment disorder with mixed anxiety and depressed mood: Secondary | ICD-10-CM | POA: Diagnosis not present

## 2016-12-02 ENCOUNTER — Other Ambulatory Visit: Payer: Self-pay | Admitting: Family Medicine

## 2016-12-04 ENCOUNTER — Telehealth: Payer: Self-pay | Admitting: *Deleted

## 2016-12-04 NOTE — Telephone Encounter (Signed)
Spoke with patient. She said son is bipolar and living in very poor conditions. He doesn't bathe, wears soiled clothes, doesn't pay rent, does drugs and has started begging on the street for money. He used to see Dr. Weber Cooks and took name brand Depakote xr which was the only med that worked for him. Now, medicaid only pays for generic which doesn't work, so he is unmedicated. He says sometimes that he will go back on meds, but he doesn't. She knows he is at the end of the line with his mental health and wanted to know if we had some way of referring her to someone who could do an "intervention" for him because she was scared of him. I told her that since he wasn't our patient, unfortunately we couldn't refer him to anyone. I suggested that she talk with a magistrate to see if they could offer any insight as to what she could/should do.   She also asked if we could refer her to a more "aggressive" psychologist/psychiatrist because even though Dr. Rexene Edison is a great ear, she is very passive and patient feels she needs a more aggressive dr.  She knows you are out of town and says that it would be okay to wait until you get back to let her know about the referral.

## 2016-12-09 ENCOUNTER — Ambulatory Visit: Payer: 59 | Admitting: Psychology

## 2016-12-11 ENCOUNTER — Ambulatory Visit: Payer: Self-pay | Admitting: Psychology

## 2016-12-12 NOTE — Telephone Encounter (Signed)
I'm sorry to hear about her son.  I recommend she come in for office visit to review options - we may be able to start her on a medication that could be beneficial without need to refer to psychiatry.

## 2016-12-14 NOTE — Telephone Encounter (Signed)
Scheduled 12/18/16, has an with psych tomorrow.

## 2016-12-15 ENCOUNTER — Ambulatory Visit (INDEPENDENT_AMBULATORY_CARE_PROVIDER_SITE_OTHER): Payer: 59 | Admitting: Psychology

## 2016-12-15 DIAGNOSIS — F4323 Adjustment disorder with mixed anxiety and depressed mood: Secondary | ICD-10-CM

## 2016-12-18 ENCOUNTER — Ambulatory Visit (INDEPENDENT_AMBULATORY_CARE_PROVIDER_SITE_OTHER): Payer: Medicare Other | Admitting: Family Medicine

## 2016-12-18 ENCOUNTER — Encounter: Payer: Self-pay | Admitting: Family Medicine

## 2016-12-18 VITALS — BP 120/80 | HR 70 | Wt 165.0 lb

## 2016-12-18 DIAGNOSIS — F4321 Adjustment disorder with depressed mood: Secondary | ICD-10-CM | POA: Insufficient documentation

## 2016-12-18 DIAGNOSIS — Z23 Encounter for immunization: Secondary | ICD-10-CM | POA: Diagnosis not present

## 2016-12-18 DIAGNOSIS — Z639 Problem related to primary support group, unspecified: Secondary | ICD-10-CM | POA: Diagnosis not present

## 2016-12-18 MED ORDER — SERTRALINE HCL 25 MG PO TABS: 25.0000 mg | ORAL_TABLET | Freq: Every day | ORAL | 3 refills | Status: DC

## 2016-12-18 NOTE — Assessment & Plan Note (Signed)
Ongoing family stressor with mentally ill son, concern for substance abuse.

## 2016-12-18 NOTE — Assessment & Plan Note (Addendum)
Endorses significant depressed mood with anhedonia. Has undergone counseling for the past year. Ongoing significant family stressors. Recommended trial sertraline 25mg  daily - sent to pharmacy. Discussed side effects to monitor. RTC 1 mo f/u visit. Discussed ongoing counseling - Dr Rexene Edison will be moving to [place], pt desires to stay local. Suggested she establish with our new counselor Bambi, if not satisfied we can refer to another local counselor. Pt agrees with plan.  PQH9 = 3 GAD7 = 3

## 2016-12-18 NOTE — Patient Instructions (Addendum)
Flu shot today I'm sorry you're going through this family difficulty.  I'm glad you've found a support group.  Start zoloft 25mg  daily. Continue seeing Dr Rexene Edison.  Return to see me in 1 month for follow up.

## 2016-12-18 NOTE — Progress Notes (Addendum)
BP 120/80   Pulse 70   Wt 165 lb (74.8 kg)   SpO2 97%   BMI 28.77 kg/m    CC: f/u visit Subjective:    Patient ID: Beth Rodgers, female    DOB: 29-Nov-1935, 81 y.o.   MRN: [id number]  HPI: Beth Rodgers is a 81 y.o. female presenting on 12/18/2016 for Follow-up   See recent phone note for details. Significant stressors with bipolar son who is off meds with substance abuse involved.   She saw Dr Rexene Edison this week. She met with counselor at [facility].  Has contact resources for bipolar association in [place].  No concern with patient's safety.   Relevant past medical, surgical, family and social history reviewed and updated as indicated. Interim medical history since our last visit reviewed. Allergies and medications reviewed and updated. Current Outpatient Prescriptions on File Prior to Visit  Medication Sig  . Ascorbic Acid (VITAMIN C CR PO) Take by mouth as directed. Reported on 12/17/2015  . calcipotriene-betamethasone (TACLONEX) ointment Apply 1 application topically daily.    Marland Kitchen CALCIUM CARBONATE PO Take 600 mg by mouth as directed. Reported on 12/17/2015  . Cholecalciferol (VITAMIN D) 2000 units CAPS Take 1 capsule (2,000 Units total) by mouth daily.  Marland Kitchen CICLOPIROX EX Apply topically as needed. Reported on 12/17/2015  . desonide (DESOWEN) 0.05 % cream APPLY TO AFFECTED AREA TWICE A DAY  . fluticasone (FLONASE) 50 MCG/ACT nasal spray Place 2 sprays into both nostrils daily.  . Homeopathic Products (ARNICARE) GEL Apply 1 application topically as directed.    Marland Kitchen losartan (COZAAR) 50 MG tablet TAKE 1 TABLET (50 MG TOTAL) BY MOUTH DAILY.  . Multiple Vitamin (MULTIVITAMIN) tablet Take 1 tablet by mouth daily. Reported on 12/17/2015  . NITROSTAT 0.4 MG SL tablet Reported on 04/16/2016  . SYNTHROID 75 MCG tablet TAKE 1 TABLET BY MOUTH EVERY DAY  . tacrolimus (PROTOPIC) 0.1 % ointment Apply 1 application topically 2 (two) times daily.    . travoprost, benzalkonium, (TRAVATAN) 0.004 %  ophthalmic solution Place 1 drop into both eyes at bedtime.    No current facility-administered medications on file prior to visit.     Review of Systems Per HPI unless specifically indicated in ROS section     Objective:    BP 120/80   Pulse 70   Wt 165 lb (74.8 kg)   SpO2 97%   BMI 28.77 kg/m   Wt Readings from Last 3 Encounters:  12/18/16 165 lb (74.8 kg)  10/23/16 159 lb 8 oz (72.3 kg)  08/12/16 160 lb 8 oz (72.8 kg)    Physical Exam  Constitutional: She appears well-developed and well-nourished. No distress.  Psychiatric: Her speech is normal and behavior is normal. Judgment and thought content normal. Cognition and memory are normal. She exhibits a depressed mood.  Nursing note and vitals reviewed.     Assessment & Plan:  Over 25 minutes were spent face-to-face with the patient during this encounter and >50% of that time was spent on counseling and coordination of care  Problem List Items Addressed This Visit    Difficulty with family    Ongoing family stressor with mentally ill son, concern for substance abuse.      Situational depression - Primary    Endorses significant depressed mood with anhedonia. Has undergone counseling for the past year. Ongoing significant family stressors. Recommended trial sertraline 64m daily - sent to pharmacy. Discussed side effects to monitor. RTC 1 mo f/u visit. Discussed ongoing  counseling - Dr Rexene Edison will be moving to [place], pt desires to stay local. Suggested she establish with our new counselor Bambi, if not satisfied we can refer to another local counselor. Pt agrees with plan.  PQH9 = 3 GAD7 = 3      Relevant Medications   sertraline (ZOLOFT) 25 MG tablet    Other Visit Diagnoses    Encounter for immunization       Relevant Orders   Flu Vaccine QUAD 36+ mos IM (Completed)       Follow up plan: Return in about 4 weeks (around 01/15/2017) for follow up visit.  Ria Bush, MD

## 2017-01-01 ENCOUNTER — Ambulatory Visit: Payer: 59 | Admitting: Psychology

## 2017-01-06 ENCOUNTER — Ambulatory Visit: Payer: 59 | Admitting: Psychology

## 2017-01-13 DIAGNOSIS — Z961 Presence of intraocular lens: Secondary | ICD-10-CM | POA: Diagnosis not present

## 2017-01-13 DIAGNOSIS — H401132 Primary open-angle glaucoma, bilateral, moderate stage: Secondary | ICD-10-CM | POA: Diagnosis not present

## 2017-01-18 DIAGNOSIS — H401132 Primary open-angle glaucoma, bilateral, moderate stage: Secondary | ICD-10-CM | POA: Diagnosis not present

## 2017-01-30 ENCOUNTER — Other Ambulatory Visit: Payer: Self-pay | Admitting: Family Medicine

## 2017-02-02 ENCOUNTER — Encounter: Payer: Self-pay | Admitting: Family Medicine

## 2017-02-02 ENCOUNTER — Ambulatory Visit (INDEPENDENT_AMBULATORY_CARE_PROVIDER_SITE_OTHER): Payer: Medicare Other | Admitting: Family Medicine

## 2017-02-02 VITALS — BP 120/84 | HR 72 | Temp 98.1°F | Wt 170.8 lb

## 2017-02-02 DIAGNOSIS — R42 Dizziness and giddiness: Secondary | ICD-10-CM | POA: Diagnosis not present

## 2017-02-02 DIAGNOSIS — I10 Essential (primary) hypertension: Secondary | ICD-10-CM

## 2017-02-02 DIAGNOSIS — J209 Acute bronchitis, unspecified: Secondary | ICD-10-CM | POA: Diagnosis not present

## 2017-02-02 MED ORDER — GUAIFENESIN-CODEINE 100-10 MG/5ML PO SYRP: 5.0000 mL | ORAL_SOLUTION | Freq: Two times a day (BID) | ORAL | 0 refills | Status: DC | PRN

## 2017-02-02 MED ORDER — LOSARTAN POTASSIUM 25 MG PO TABS: 25.0000 mg | ORAL_TABLET | Freq: Every day | ORAL | 1 refills | Status: DC

## 2017-02-02 NOTE — Assessment & Plan Note (Signed)
Present since prior to illness - with positive orthostatics today. Will decrease losartan dose to 25mg  daily. Pt agrees with plan.

## 2017-02-02 NOTE — Progress Notes (Signed)
Pre visit review using our clinic review tool, if applicable. No additional management support is needed unless otherwise documented below in the visit note. 

## 2017-02-02 NOTE — Assessment & Plan Note (Signed)
Trial lower losartan dose - see below

## 2017-02-02 NOTE — Patient Instructions (Signed)
Baje dosis de losartan a 25mg  - para ver si mareo se mejora con esto. Creo que tiene infeccion respiratoria viral y bronquitis.  Antibiotico normalmente no es necesario para virus.  Su cuerpo necesita descanso y liquidos. Puede tomar ibuprofeno 400mg  con comidas para inflamacion.  Siga gargaras de agua y use medicina para la tos con codenia.  Dejeme saber si no mejora con estas medicinas.

## 2017-02-02 NOTE — Progress Notes (Signed)
BP 120/84 (BP Location: Right Arm, Cuff Size: Normal)   Pulse 72   Temp 98.1 F (36.7 C) (Oral)   Wt 170 lb 12 oz (77.5 kg)   SpO2 94%   BMI 29.77 kg/m    CC: URI Subjective:    Patient ID: Beth Rodgers, female    DOB: Feb 04, 1935, 82 y.o.   MRN: [id number]  HPI: Beth Rodgers is a 82 y.o. female presenting on 02/02/2017 for URI (x6 days; productive cough; no fever) and Dizziness   6-7 d h/o URI sxs, thinks she was exposed to sick grandson. Worried progressing into chest. + cough worse at night time.   No fevers/chills, appetite changes, wheezing or dyspnea. No headaches or significant sinus congestion. No body aches  Has been treating with salt water gargles and some OTC remedy she is unsure which.  Non smoker.  No h/o asthma.   Some dizziness noted over last few weeks.  BP Readings from Last 3 Encounters:  02/02/17 120/84  12/18/16 120/80  10/23/16 122/78     Relevant past medical, surgical, family and social history reviewed and updated as indicated. Interim medical history since our last visit reviewed. Allergies and medications reviewed and updated. Current Outpatient Prescriptions on File Prior to Visit  Medication Sig  . Ascorbic Acid (VITAMIN C CR PO) Take by mouth as directed. Reported on 12/17/2015  . calcipotriene-betamethasone (TACLONEX) ointment Apply 1 application topically daily.    Marland Kitchen CALCIUM CARBONATE PO Take 600 mg by mouth as directed. Reported on 12/17/2015  . Cholecalciferol (VITAMIN D) 2000 units CAPS Take 1 capsule (2,000 Units total) by mouth daily.  Marland Kitchen CICLOPIROX EX Apply topically as needed. Reported on 12/17/2015  . desonide (DESOWEN) 0.05 % cream APPLY TO AFFECTED AREA TWICE A DAY  . fluticasone (FLONASE) 50 MCG/ACT nasal spray Place 2 sprays into both nostrils daily.  . Homeopathic Products (ARNICARE) GEL Apply 1 application topically as directed.    . Multiple Vitamin (MULTIVITAMIN) tablet Take 1 tablet by mouth daily. Reported on 12/17/2015    . NITROSTAT 0.4 MG SL tablet Reported on 04/16/2016  . sertraline (ZOLOFT) 25 MG tablet Take 1 tablet (25 mg total) by mouth daily.  Marland Kitchen SYNTHROID 75 MCG tablet TAKE 1 TABLET BY MOUTH EVERY DAY  . tacrolimus (PROTOPIC) 0.1 % ointment Apply 1 application topically 2 (two) times daily.     No current facility-administered medications on file prior to visit.     Review of Systems Per HPI unless specifically indicated in ROS section     Objective:    BP 120/84 (BP Location: Right Arm, Cuff Size: Normal)   Pulse 72   Temp 98.1 F (36.7 C) (Oral)   Wt 170 lb 12 oz (77.5 kg)   SpO2 94%   BMI 29.77 kg/m   Wt Readings from Last 3 Encounters:  02/02/17 170 lb 12 oz (77.5 kg)  12/18/16 165 lb (74.8 kg)  10/23/16 159 lb 8 oz (72.3 kg)    Physical Exam  Constitutional: She appears well-developed and well-nourished. No distress.  HENT:  Head: Normocephalic and atraumatic.  Right Ear: Hearing, tympanic membrane, external ear and ear canal normal.  Left Ear: Hearing, tympanic membrane, external ear and ear canal normal.  Rodgers: No mucosal edema or rhinorrhea. Right sinus exhibits no maxillary sinus tenderness and no frontal sinus tenderness. Left sinus exhibits no maxillary sinus tenderness and no frontal sinus tenderness.  Mouth/Throat: Uvula is midline and mucous membranes are normal. Posterior oropharyngeal erythema  present. No oropharyngeal exudate, posterior oropharyngeal edema or tonsillar abscesses.  Some fluid behind R TM  Eyes: Conjunctivae and EOM are normal. Pupils are equal, round, and reactive to light. No scleral icterus.  Neck: Normal range of motion. Neck supple.  Cardiovascular: Normal rate, regular rhythm, normal heart sounds and intact distal pulses.   No murmur heard. Pulmonary/Chest: Effort normal and breath sounds normal. No respiratory distress. She has no wheezes. She has no rales.  Lymphadenopathy:    She has no cervical adenopathy.  Skin: Skin is warm and dry. No  rash noted.  Nursing note and vitals reviewed.  Orthostatics positive today    Assessment & Plan:   Problem List Items Addressed This Visit    Acute bronchitis - Primary    Anticipate viral. Supportive care reviewed with patient. Red flags to return discussed. Codeine cough syrup PRN - sedation precautions discussed.       Dizziness    Present since prior to illness - with positive orthostatics today. Will decrease losartan dose to 25mg  daily. Pt agrees with plan.      Essential hypertension    Trial lower losartan dose - see below       Relevant Medications   losartan (COZAAR) 25 MG tablet       Follow up plan: Return if symptoms worsen or fail to improve.  Ria Bush, MD

## 2017-02-02 NOTE — Assessment & Plan Note (Signed)
Anticipate viral. Supportive care reviewed with patient. Red flags to return discussed. Codeine cough syrup PRN - sedation precautions discussed.

## 2017-02-03 ENCOUNTER — Ambulatory Visit: Payer: 59 | Admitting: Psychology

## 2017-02-03 ENCOUNTER — Telehealth: Payer: Self-pay | Admitting: *Deleted

## 2017-02-03 NOTE — Telephone Encounter (Signed)
Filled and in Kim's box. 

## 2017-02-03 NOTE — Telephone Encounter (Signed)
PA for synthroid in your IN box for completion. (I'm guessing name brand needs approval)

## 2017-02-03 NOTE — Telephone Encounter (Signed)
Forms faxed [phone]

## 2017-02-05 NOTE — Telephone Encounter (Signed)
PA approved.

## 2017-02-09 ENCOUNTER — Telehealth: Payer: Self-pay | Admitting: Family Medicine

## 2017-02-09 DIAGNOSIS — H409 Unspecified glaucoma: Secondary | ICD-10-CM

## 2017-02-09 NOTE — Telephone Encounter (Signed)
Patient called to request a Opthymologist appt for Glaucoma. Her Opthy at [facility] recommended that she see someone here in [place] about her eye pressure and have her eyes checked. Pls place referral.

## 2017-02-09 NOTE — Telephone Encounter (Signed)
Referral placed.

## 2017-02-11 DIAGNOSIS — M25511 Pain in right shoulder: Secondary | ICD-10-CM | POA: Diagnosis not present

## 2017-02-11 DIAGNOSIS — M7581 Other shoulder lesions, right shoulder: Secondary | ICD-10-CM | POA: Diagnosis not present

## 2017-02-17 ENCOUNTER — Ambulatory Visit (INDEPENDENT_AMBULATORY_CARE_PROVIDER_SITE_OTHER): Payer: Medicare Other | Admitting: Psychology

## 2017-02-17 DIAGNOSIS — F4323 Adjustment disorder with mixed anxiety and depressed mood: Secondary | ICD-10-CM

## 2017-02-18 ENCOUNTER — Telehealth: Payer: Self-pay | Admitting: Family Medicine

## 2017-02-18 NOTE — Telephone Encounter (Signed)
Noted. rec she take 2 losartan a day until seen tomorrow.  Think about any diet or other changes that could have precipitated increase in BP

## 2017-02-18 NOTE — Telephone Encounter (Signed)
Patient Name: Beth Rodgers  DOB: 30-Apr-1935    Initial Comment Last night took Losartan at 7pm. This morning BP 165/95 then took another Losartan at 5am and BP is still 175/95 , 182/100 , 186/96 , 173/89   Nurse Assessment  Nurse: Raphael Gibney, RN, Vera Date/Time (Eastern Time): 02/18/2017 10:52:14 AM  Confirm and document reason for call. If symptomatic, describe symptoms. ---Caller states her BP is elevated. she took Losartan at 7 pm and her BP was 165/95 at 5 am. Took another Losartan at 5 am. BP 175/95 about 45 min later. BP 182/100 a hr later. BP 186/96 later. Last BP 173/89. She normally takes Losartan. She has appt tomorrow at 10:15 am.  Does the patient have any new or worsening symptoms? ---Yes  Will a triage be completed? ---Yes  Related visit to physician within the last 2 weeks? ---No  Does the PT have any chronic conditions? (i.e. diabetes, asthma, etc.) ---Yes  List chronic conditions. ---HTN; thyroid  Is this a behavioral health or substance abuse call? ---No     Guidelines    Guideline Title Affirmed Question Affirmed Notes  High Blood Pressure [1] BP ? 140/90 AND [2] taking BP medications    Final Disposition User   See PCP within 2 Maudry Diego, RN, Vera    Referrals  REFERRED TO PCP OFFICE   Disagree/Comply: Leta Baptist

## 2017-02-18 NOTE — Telephone Encounter (Signed)
Pt has appt to see Dr Darnell Level on 02/19/17 at 10:15.

## 2017-02-18 NOTE — Telephone Encounter (Signed)
Patient notified. She will try to think of any changes that may have caused the spike in BP. She said BP was trending down back to normal now. She said she normally takes her losartan at bedtime. She will recheck BP before she takes med and if it is normal, she will only take one. If it is high, she will take 2.

## 2017-02-19 ENCOUNTER — Ambulatory Visit (INDEPENDENT_AMBULATORY_CARE_PROVIDER_SITE_OTHER): Payer: Medicare Other | Admitting: Family Medicine

## 2017-02-19 ENCOUNTER — Encounter: Payer: Self-pay | Admitting: Family Medicine

## 2017-02-19 ENCOUNTER — Ambulatory Visit (INDEPENDENT_AMBULATORY_CARE_PROVIDER_SITE_OTHER): Payer: Medicare Other

## 2017-02-19 VITALS — BP 120/90 | HR 78 | Temp 97.6°F | Ht 63.0 in | Wt 162.0 lb

## 2017-02-19 VITALS — BP 140/84 | HR 78 | Temp 97.6°F | Ht 63.0 in | Wt 162.0 lb

## 2017-02-19 DIAGNOSIS — I10 Essential (primary) hypertension: Secondary | ICD-10-CM

## 2017-02-19 DIAGNOSIS — Z0000 Encounter for general adult medical examination without abnormal findings: Secondary | ICD-10-CM | POA: Diagnosis not present

## 2017-02-19 MED ORDER — LOSARTAN POTASSIUM 50 MG PO TABS: 50.0000 mg | ORAL_TABLET | Freq: Every day | ORAL | 1 refills | Status: DC

## 2017-02-19 NOTE — Patient Instructions (Signed)
Beth Rodgers , Thank you for taking time to come for your Medicare Wellness Visit. I appreciate your ongoing commitment to your health goals. Please review the following plan we discussed and let me know if I can assist you in the future.   These are the goals we discussed: Goals    . Increase physical activity          Starting 02/19/17, I will continue to stretch for 45 min 3 days per week.        This is a list of the screening recommended for you and due dates:  Health Maintenance  Topic Date Due  . DTaP/Tdap/Td vaccine (1 - Tdap) 05/24/2022*  . Tetanus Vaccine  05/24/2022  . Flu Shot  Completed  . DEXA scan (bone density measurement)  Completed  . Pneumonia vaccines  Completed  *Topic was postponed. The date shown is not the original due date.   Preventive Care for Adults  A healthy lifestyle and preventive care can promote health and wellness. Preventive health guidelines for adults include the following key practices.  . A routine yearly physical is a good way to check with your health care provider about your health and preventive screening. It is a chance to share any concerns and updates on your health and to receive a thorough exam.  . Visit your dentist for a routine exam and preventive care every 6 months. Brush your teeth twice a day and floss once a day. Good oral hygiene prevents tooth decay and gum disease.  . The frequency of eye exams is based on your age, health, family medical history, use  of contact lenses, and other factors. Follow your health care provider's ecommendations for frequency of eye exams.  . Eat a healthy diet. Foods like vegetables, fruits, whole grains, low-fat dairy products, and lean protein foods contain the nutrients you need without too many calories. Decrease your intake of foods high in solid fats, added sugars, and salt. Eat the right amount of calories for you. Get information about a proper diet from your health care provider, if  necessary.  . Regular physical exercise is one of the most important things you can do for your health. Most adults should get at least 150 minutes of moderate-intensity exercise (any activity that increases your heart rate and causes you to sweat) each week. In addition, most adults need muscle-strengthening exercises on 2 or more days a week.  Silver Sneakers may be a benefit available to you. To determine eligibility, you may visit the website: www.silversneakers.com or contact program at [phone] Mon-Fri between 8AM-8PM.   . Maintain a healthy weight. The body mass index (BMI) is a screening tool to identify possible weight problems. It provides an estimate of body fat based on height and weight. Your health care provider can find your BMI and can help you achieve or maintain a healthy weight.   For adults 20 years and older: ? A BMI below 18.5 is considered underweight. ? A BMI of 18.5 to 24.9 is normal. ? A BMI of 25 to 29.9 is considered overweight. ? A BMI of 30 and above is considered obese.   . Maintain normal blood lipids and cholesterol levels by exercising and minimizing your intake of saturated fat. Eat a balanced diet with plenty of fruit and vegetables. Blood tests for lipids and cholesterol should begin at age 32 and be repeated every 5 years. If your lipid or cholesterol levels are high, you are over 50, or you  are at high risk for heart disease, you may need your cholesterol levels checked more frequently. Ongoing high lipid and cholesterol levels should be treated with medicines if diet and exercise are not working.  . If you smoke, find out from your health care provider how to quit. If you do not use tobacco, please do not start.  . If you choose to drink alcohol, please do not consume more than 2 drinks per day. One drink is considered to be 12 ounces (355 mL) of beer, 5 ounces (148 mL) of wine, or 1.5 ounces (44 mL) of liquor.  . If you are 66-51 years old, ask your  health care provider if you should take aspirin to prevent strokes.  . Use sunscreen. Apply sunscreen liberally and repeatedly throughout the day. You should seek shade when your shadow is shorter than you. Protect yourself by wearing long sleeves, pants, a wide-brimmed hat, and sunglasses year round, whenever you are outdoors.  . Once a month, do a whole body skin exam, using a mirror to look at the skin on your back. Tell your health care provider of new moles, moles that have irregular borders, moles that are larger than a pencil eraser, or moles that have changed in shape or color.

## 2017-02-19 NOTE — Progress Notes (Signed)
PCP notes:   Health maintenance:  No gaps identified.  Abnormal screenings:   Hearing - failed  Patient concerns:   Patient verbalized concerns about elevated BP. Patient has acute visit today with PCP to discuss this concern.  Patient also verbalized increased anxiety about her son who is bipolar.   Nurse concerns:  None  Next PCP appt:   02/19/17 @ 1015

## 2017-02-19 NOTE — Patient Instructions (Addendum)
Home blood pressure cuff is off. Try to get new cuff.  Continue losartan 50mg  daily at 7pm.  Baseline EKG today.  Let us know how you do on higher dose.

## 2017-02-19 NOTE — Progress Notes (Signed)
Subjective:   Beth Rodgers is a 82 y.o. female who presents for Medicare Annual (Subsequent) preventive examination.  Review of Systems:  N/A Cardiac Risk Factors include: advanced age (>69men, >12 women);hypertension     Objective:     Vitals: BP 120/90 (BP Location: Right Arm, Patient Position: Sitting, Cuff Size: Normal)   Pulse 78   Temp 97.6 F (36.4 C) (Oral)   Ht 5\' 3"  (1.6 m) Comment: no shoes  Wt 162 lb (73.5 kg)   SpO2 95%   BMI 28.70 kg/m   Body mass index is 28.7 kg/m.   Tobacco History  Smoking Status  . Never Smoker  Smokeless Tobacco  . Never Used     Counseling given: No   Past Medical History:  Diagnosis Date  . Chronic venous insufficiency    with varicose veins  . Glaucoma   . History  of basal cell carcinoma    left nare  . History of chicken pox   . Hypothyroid   . Osteoarthritis    back pain, L knee pain  . Osteopenia 06/2009   DEXA 11/2015: T -2.3 hip, -2.2 spine, on longterm bisphosphonate/evista  . Pyogenic granuloma 04/16/2016   Past Surgical History:  Procedure Laterality Date  . BREAST BIOPSY     core  . CATARACT EXTRACTION     bilateral  . DEXA  06/2009   T score Spine: -1.8, hip -2.0  . Foam sclerotherapy Bilateral 09/2015   [facility], [place]  . NASAL RECONSTRUCTION  2005   for BCC L nare s/p Mohs  . nuclear stress test  2014   normal in [place]  . RADIOFREQUENCY ABLATION Left 01/2012   remnant L G saphenous vein below knee  . RADIOFREQUENCY ABLATION Right 02/2013   RLE vein ablation   . VEIN LIGATION AND STRIPPING  remote   bilateral G saphenous veins   Family History  Problem Relation Age of Onset  . Cancer Mother     thyroid cancer  . Heart disease Father     CHF  . Diabetes Father   . Glaucoma Father   . Arthritis Father   . Coronary artery disease Maternal Uncle   . Bipolar disorder Son   . Stroke Neg Hx   . Breast cancer Neg Hx    History  Sexual Activity  . Sexual activity: Not Currently    . Birth control/ protection: Post-menopausal    Outpatient Encounter Prescriptions as of 02/19/2017  Medication Sig  . Ascorbic Acid (VITAMIN C CR PO) Take by mouth as directed. Reported on 12/17/2015  . calcipotriene-betamethasone (TACLONEX) ointment Apply 1 application topically daily.    Marland Kitchen CALCIUM CARBONATE PO Take 600 mg by mouth as directed. Reported on 12/17/2015  . Cholecalciferol (VITAMIN D) 2000 units CAPS Take 1 capsule (2,000 Units total) by mouth daily.  Marland Kitchen CICLOPIROX EX Apply topically as needed. Reported on 12/17/2015  . desonide (DESOWEN) 0.05 % cream APPLY TO AFFECTED AREA TWICE A DAY  . Multiple Vitamin (MULTIVITAMIN) tablet Take 1 tablet by mouth daily. Reported on 12/17/2015  . sertraline (ZOLOFT) 25 MG tablet Take 1 tablet (25 mg total) by mouth daily.  Marland Kitchen SYNTHROID 75 MCG tablet TAKE 1 TABLET BY MOUTH EVERY DAY  . Travoprost, BAK Free, (TRAVATAN) 0.004 % SOLN ophthalmic solution Place 1 drop into both eyes at bedtime.  . [DISCONTINUED] fluticasone (FLONASE) 50 MCG/ACT nasal spray Place 2 sprays into both nostrils daily.  . [DISCONTINUED] Homeopathic Products (ARNICARE) GEL Apply 1  application topically as directed.    . [DISCONTINUED] losartan (COZAAR) 25 MG tablet Take 1 tablet (25 mg total) by mouth daily.  . [DISCONTINUED] tacrolimus (PROTOPIC) 0.1 % ointment Apply 1 application topically 2 (two) times daily.    Marland Kitchen guaiFENesin-codeine (CHERATUSSIN AC) 100-10 MG/5ML syrup Take 5 mLs by mouth 2 (two) times daily as needed. (Patient not taking: Reported on 02/19/2017)  . NITROSTAT 0.4 MG SL tablet Reported on 04/16/2016  . [DISCONTINUED] brimonidine-timolol (COMBIGAN) 0.2-0.5 % ophthalmic solution Place 1 drop into both eyes every 12 (twelve) hours.   No facility-administered encounter medications on file as of 02/19/2017.     Activities of Daily Living In your present state of health, do you have any difficulty performing the following activities: 02/19/2017  Hearing? N   Vision? N  Difficulty concentrating or making decisions? N  Walking or climbing stairs? Y  Dressing or bathing? N  Doing errands, shopping? N  Preparing Food and eating ? N  Using the Toilet? N  In the past six months, have you accidently leaked urine? N  Do you have problems with loss of bowel control? N  Managing your Medications? N  Managing your Finances? N  Housekeeping or managing your Housekeeping? N  Some recent data might be hidden    Patient Care Team: Ria Bush, MD as PCP - General (Family Medicine)    Assessment:     Hearing Screening   125Hz  250Hz  500Hz  1000Hz  2000Hz  3000Hz  4000Hz  6000Hz  8000Hz   Right ear:   40 40 40  40    Left ear:   40 40 40  0    Vision Screening Comments: Last vision exam in December 2017 in [place]; has new pt appt @ [facility] with Dr. Joan Mayans   Exercise Activities and Dietary recommendations Current Exercise Habits: Home exercise routine, Type of exercise: stretching, Time (Minutes): 45, Frequency (Times/Week): 3, Weekly Exercise (Minutes/Week): 135, Intensity: Mild, Exercise limited by: None identified  Goals    . Increase physical activity          Starting 02/19/17, I will continue to stretch for 45 min 3 days per week.       Fall Risk Fall Risk  02/19/2017 10/23/2016 12/17/2015 10/30/2014 09/06/2013  Falls in the past year? No No No No Yes  Number falls in past yr: - - - - 1  Injury with Fall? - - - - No   Depression Screen PHQ 2/9 Scores 02/19/2017 12/18/2016 10/23/2016 12/17/2015  PHQ - 2 Score 0 2 0 0  PHQ- 9 Score - 3 - -     Cognitive Function MMSE - Mini Mental State Exam 02/19/2017  Orientation to time 5  Orientation to Place 5  Registration 3  Attention/ Calculation 0  Recall 3  Language- name 2 objects 0  Language- repeat 1  Language- follow 3 step command 3  Language- read & follow direction 0  Write a sentence 0  Copy design 0  Total score 20       PLEASE NOTE: A Mini-Cog screen was  completed. Maximum score is 20. A value of 0 denotes this part of Folstein MMSE was not completed or the patient failed this part of the Mini-Cog screening.   Mini-Cog Screening Orientation to Time - Max 5 pts Orientation to Place - Max 5 pts Registration - Max 3 pts Recall - Max 3 pts Language Repeat - Max 1 pts Language Follow 3 Step Command - Max 3 pts  Immunization History  Administered Date(s) Administered  . Hep A / Hep B 06/05/2015, 12/17/2015  . Influenza Split 11/15/2012  . Influenza, High Dose Seasonal PF 11/14/2014  . Influenza,inj,Quad PF,36+ Mos 09/06/2013, 12/18/2016  . Pneumococcal Conjugate-13 10/30/2014  . Pneumococcal Polysaccharide-23 11/15/2012  . Td 05/24/2012  . Zoster 07/03/2014   Screening Tests Health Maintenance  Topic Date Due  . DTaP/Tdap/Td (1 - Tdap) 05/24/2022 (Originally 05/25/2012)  . TETANUS/TDAP  05/24/2022  . INFLUENZA VACCINE  Completed  . DEXA SCAN  Completed  . PNA vac Low Risk Adult  Completed      Plan:     I have personally reviewed and addressed the Medicare Annual Wellness questionnaire and have noted the following in the patient's chart:  A. Medical and social history B. Use of alcohol, tobacco or illicit drugs  C. Current medications and supplements D. Functional ability and status E.  Nutritional status F.  Physical activity G. Advance directives H. List of other physicians I.  Hospitalizations, surgeries, and ER visits in previous 12 months J.  [place] to include hearing, vision, cognitive, depression L. Referrals and appointments - none  In addition, I have reviewed and discussed with patient certain preventive protocols, quality metrics, and best practice recommendations. A written personalized care plan for preventive services as well as general preventive health recommendations were provided to patient.  See attached scanned questionnaire for additional information.   Signed,   Lindell Noe, MHA,  BS, LPN Health Coach

## 2017-02-19 NOTE — Assessment & Plan Note (Addendum)
Anticipate home BP cuff not accurate given drastic change - home cuff read 170s/90s, on my check 140s/80s. Possibly related to bp check procedure - reviewed with patient.  Will continue higher losartan dose of 50mg  daily - sent to pharmacy. Pt and husband agree with plan.  Will check baseline EKG today.

## 2017-02-19 NOTE — Progress Notes (Signed)
Pre visit review using our clinic review tool, if applicable. No additional management support is needed unless otherwise documented below in the visit note. 

## 2017-02-19 NOTE — Progress Notes (Addendum)
BP 140/84 (BP Location: Right Arm, Cuff Size: Normal)   Pulse 78   Temp 97.6 F (36.4 C) (Oral)   Ht 5\' 3"  (1.6 m) Comment: no shoes  Wt 162 lb (73.5 kg)   SpO2 95%   BMI 28.70 kg/m    CC: elevated BP Subjective:    Patient ID: Beth Rodgers, female    DOB: [birth year], 82 y.o.   MRN: [id number]  HPI: Beth Rodgers is a 82 y.o. female presenting on 02/19/2017 for No chief complaint on file.   See recent phone note for details. Elevated BP readings over the last few days - up to 160/90s with unclear trigger. Antihypertensive regimen is losartan 25mg  daily. This was decreased from 50mg  daily due to orthostatic dizziness noted 02/02/2017. She has actually increased losartan back to 50mg  daily.   Denies HA, vision changes, SOB, leg swelling.  Occasional chest pressure at night time.   Saw Katha Cabal today for medicare wellness visit. Note will be reviewed.    Relevant past medical, surgical, family and social history reviewed and updated as indicated. Interim medical history since our last visit reviewed. Allergies and medications reviewed and updated.  Outpatient Medications Prior to Visit  Medication Sig Dispense Refill  . Ascorbic Acid (VITAMIN C CR PO) Take by mouth as directed. Reported on 12/17/2015    . calcipotriene-betamethasone (TACLONEX) ointment Apply 1 application topically daily.      Marland Kitchen CALCIUM CARBONATE PO Take 600 mg by mouth as directed. Reported on 12/17/2015    . Cholecalciferol (VITAMIN D) 2000 units CAPS Take 1 capsule (2,000 Units total) by mouth daily.    Marland Kitchen CICLOPIROX EX Apply topically as needed. Reported on 12/17/2015    . desonide (DESOWEN) 0.05 % cream APPLY TO AFFECTED AREA TWICE A DAY 30 g 1  . Multiple Vitamin (MULTIVITAMIN) tablet Take 1 tablet by mouth daily. Reported on 12/17/2015    . NITROSTAT 0.4 MG SL tablet Reported on 04/16/2016    . sertraline (ZOLOFT) 25 MG tablet Take 1 tablet (25 mg total) by mouth daily. 30 tablet 3  . SYNTHROID 75 MCG tablet  TAKE 1 TABLET BY MOUTH EVERY DAY 90 tablet 0  . Travoprost, BAK Free, (TRAVATAN) 0.004 % SOLN ophthalmic solution Place 1 drop into both eyes at bedtime.    Marland Kitchen losartan (COZAAR) 25 MG tablet Take 1 tablet (25 mg total) by mouth daily. 90 tablet 1  . guaiFENesin-codeine (CHERATUSSIN AC) 100-10 MG/5ML syrup Take 5 mLs by mouth 2 (two) times daily as needed. (Patient not taking: Reported on 02/19/2017) 120 mL 0   No facility-administered medications prior to visit.      Per HPI unless specifically indicated in ROS section below Review of Systems     Objective:    BP 140/84 (BP Location: Right Arm, Cuff Size: Normal)   Pulse 78   Temp 97.6 F (36.4 C) (Oral)   Ht 5\' 3"  (1.6 m) Comment: no shoes  Wt 162 lb (73.5 kg)   SpO2 95%   BMI 28.70 kg/m   Wt Readings from Last 3 Encounters:  02/19/17 162 lb (73.5 kg)  02/19/17 162 lb (73.5 kg)  02/02/17 170 lb 12 oz (77.5 kg)    Physical Exam  Constitutional: She appears well-developed and well-nourished. No distress.  HENT:  Mouth/Throat: Oropharynx is clear and moist. No oropharyngeal exudate.  Cardiovascular: Normal rate, regular rhythm, normal heart sounds and intact distal pulses.   No murmur heard. Pulmonary/Chest: Effort normal and breath  sounds normal. No respiratory distress. She has no wheezes. She has no rales.  Musculoskeletal: She exhibits no edema.  Skin: Skin is warm and dry. No rash noted.  Nursing note and vitals reviewed.  EKG - NSR rate 60, normal axis, intervals, no acute ST/T changes.     Assessment & Plan:   Problem List Items Addressed This Visit    Essential hypertension    Anticipate home BP cuff not accurate given drastic change - home cuff read 170s/90s, on my check 140s/80s. Possibly related to bp check procedure - reviewed with patient.  Will continue higher losartan dose of 50mg  daily - sent to pharmacy. Pt and husband agree with plan.  Will check baseline EKG today.       Relevant Medications    losartan (COZAAR) 50 MG tablet       Follow up plan: Return if symptoms worsen or fail to improve.  Ria Bush, MD

## 2017-02-19 NOTE — Addendum Note (Signed)
Addended by: Royann Shivers A on: 02/19/2017 11:04 AM   Modules accepted: Orders

## 2017-02-21 NOTE — Progress Notes (Signed)
I reviewed health advisor's note, was available for consultation, and agree with documentation and plan.  

## 2017-02-22 ENCOUNTER — Encounter: Payer: Self-pay | Admitting: *Deleted

## 2017-02-23 DIAGNOSIS — L400 Psoriasis vulgaris: Secondary | ICD-10-CM | POA: Diagnosis not present

## 2017-03-03 ENCOUNTER — Ambulatory Visit: Payer: Medicare Other | Admitting: Psychology

## 2017-03-05 DIAGNOSIS — H401132 Primary open-angle glaucoma, bilateral, moderate stage: Secondary | ICD-10-CM | POA: Diagnosis not present

## 2017-03-17 ENCOUNTER — Ambulatory Visit: Payer: Medicare Other | Admitting: Psychology

## 2017-03-19 ENCOUNTER — Ambulatory Visit (INDEPENDENT_AMBULATORY_CARE_PROVIDER_SITE_OTHER): Payer: Medicare Other | Admitting: Family Medicine

## 2017-03-19 ENCOUNTER — Encounter: Payer: Self-pay | Admitting: Family Medicine

## 2017-03-19 VITALS — BP 124/74 | HR 92 | Temp 99.3°F | Wt 166.0 lb

## 2017-03-19 DIAGNOSIS — J069 Acute upper respiratory infection, unspecified: Secondary | ICD-10-CM

## 2017-03-19 DIAGNOSIS — J208 Acute bronchitis due to other specified organisms: Secondary | ICD-10-CM

## 2017-03-19 DIAGNOSIS — B9789 Other viral agents as the cause of diseases classified elsewhere: Secondary | ICD-10-CM

## 2017-03-19 DIAGNOSIS — R42 Dizziness and giddiness: Secondary | ICD-10-CM

## 2017-03-19 DIAGNOSIS — B9689 Other specified bacterial agents as the cause of diseases classified elsewhere: Secondary | ICD-10-CM | POA: Insufficient documentation

## 2017-03-19 NOTE — Progress Notes (Signed)
BP 124/74   Pulse 92   Temp 99.3 F (37.4 C) (Oral)   Wt 166 lb (75.3 kg)   BMI 29.41 kg/m    CC: URI Subjective:    Patient ID: Beth Rodgers, female    DOB: 14-Apr-1935, 82 y.o.   MRN: [id number]  HPI: Beth Rodgers is a 82 y.o. female presenting on 03/19/2017 for URI   3d h/o congestion, rhinorrhea, PNdrainage, mild chills, mild cough.   No fevers, ear or tooth pain, headache. Sleeping well.   + sick contacts at home - husband and grandchildren. No h/o asthma. Not around smokers Treating with vit C and cough syrup.   Ongoing occasional intermittent unsteadiness more noticeable when standing quickly from prolonged sitting. Doesn't drink well. No dyspnea, palpitations, headaches. Known CVI regularly uses compression stockings.   Relevant past medical, surgical, family and social history reviewed and updated as indicated. Interim medical history since our last visit reviewed. Allergies and medications reviewed and updated. Outpatient Medications Prior to Visit  Medication Sig Dispense Refill  . Ascorbic Acid (VITAMIN C CR PO) Take by mouth as directed. Reported on 12/17/2015    . losartan (COZAAR) 50 MG tablet Take 1 tablet (50 mg total) by mouth daily. 90 tablet 1  . SYNTHROID 75 MCG tablet TAKE 1 TABLET BY MOUTH EVERY DAY 90 tablet 0  . Travoprost, BAK Free, (TRAVATAN) 0.004 % SOLN ophthalmic solution Place 1 drop into both eyes at bedtime.    . calcipotriene-betamethasone (TACLONEX) ointment Apply 1 application topically daily.      Marland Kitchen CALCIUM CARBONATE PO Take 600 mg by mouth as directed. Reported on 12/17/2015    . Cholecalciferol (VITAMIN D) 2000 units CAPS Take 1 capsule (2,000 Units total) by mouth daily. (Patient not taking: Reported on 03/19/2017)    . CICLOPIROX EX Apply topically as needed. Reported on 12/17/2015    . desonide (DESOWEN) 0.05 % cream APPLY TO AFFECTED AREA TWICE A DAY (Patient not taking: Reported on 03/19/2017) 30 g 1  . Multiple Vitamin  (MULTIVITAMIN) tablet Take 1 tablet by mouth daily. Reported on 12/17/2015    . NITROSTAT 0.4 MG SL tablet Reported on 04/16/2016    . sertraline (ZOLOFT) 25 MG tablet Take 1 tablet (25 mg total) by mouth daily. (Patient not taking: Reported on 03/19/2017) 30 tablet 3  . guaiFENesin-codeine (CHERATUSSIN AC) 100-10 MG/5ML syrup Take 5 mLs by mouth 2 (two) times daily as needed. (Patient not taking: Reported on 02/19/2017) 120 mL 0   No facility-administered medications prior to visit.      Per HPI unless specifically indicated in ROS section below Review of Systems     Objective:    BP 124/74   Pulse 92   Temp 99.3 F (37.4 C) (Oral)   Wt 166 lb (75.3 kg)   BMI 29.41 kg/m   Wt Readings from Last 3 Encounters:  03/19/17 166 lb (75.3 kg)  02/19/17 162 lb (73.5 kg)  02/19/17 162 lb (73.5 kg)    Physical Exam  Constitutional: She appears well-developed and well-nourished. No distress.  HENT:  Head: Normocephalic and atraumatic.  Right Ear: Hearing, tympanic membrane, external ear and ear canal normal.  Left Ear: Hearing, tympanic membrane, external ear and ear canal normal.  Rodgers: Mucosal edema and rhinorrhea present. Right sinus exhibits no maxillary sinus tenderness and no frontal sinus tenderness. Left sinus exhibits no maxillary sinus tenderness and no frontal sinus tenderness.  Mouth/Throat: Uvula is midline, oropharynx is clear and moist  and mucous membranes are normal. No oropharyngeal exudate, posterior oropharyngeal edema, posterior oropharyngeal erythema or tonsillar abscesses.  Eyes: Conjunctivae and EOM are normal. Pupils are equal, round, and reactive to light. No scleral icterus.  Neck: Normal range of motion. Neck supple.  Cardiovascular: Normal rate, regular rhythm, normal heart sounds and intact distal pulses.   No murmur heard. Pulmonary/Chest: Effort normal. No respiratory distress. She has no decreased breath sounds. She has no wheezes. She has rhonchi (R sided). She  has no rales.  Lymphadenopathy:    She has no cervical adenopathy.  Skin: Skin is warm and dry. No rash noted.  Nursing note and vitals reviewed.  Lab Results  Component Value Date   TSH 1.10 08/12/2016    Lab Results  Component Value Date   CREATININE 0.67 08/12/2016    Lab Results  Component Value Date   WBC 5.8 08/12/2016   HGB 13.5 08/12/2016   HCT 40.4 08/12/2016   MCV 87.3 08/12/2016   PLT 205.0 08/12/2016       Assessment & Plan:   Problem List Items Addressed This Visit    Dizziness    Clarified to mean mild orthostatic unsteadiness. We did recently increase losartan. Anticipate component of mild dehydration as she does drink several caffeinated drinks per day and doesn't do well with water. rec increase water intake and update [place] with effect. Pt agrees with plan.       Viral URI with cough - Primary    Anticipate viral given short duration. She does have some R lobe rhonchi. Supportive care reviewed. Update if fever, worsening productive cough or not improving as expected. Pt and husband agree with plan.           Follow up plan: Return if symptoms worsen or fail to improve.  Ria Bush, MD

## 2017-03-19 NOTE — Assessment & Plan Note (Signed)
Clarified to mean mild orthostatic unsteadiness. We did recently increase losartan. Anticipate component of mild dehydration as she does drink several caffeinated drinks per day and doesn't do well with water. rec increase water intake and update [place] with effect. Pt agrees with plan.

## 2017-03-19 NOTE — Progress Notes (Signed)
Pre visit review using our clinic review tool, if applicable. No additional management support is needed unless otherwise documented below in the visit note. 

## 2017-03-19 NOTE — Patient Instructions (Signed)
You have a viral upper respiratory infection. Antibiotics are not needed for this.  Viral infections usually take 7-10 days to resolve.  The cough can last a couple weeks to go away. Push fluids and plenty of rest. May take plain mucinex with plenty of water to help mobilize mucous out. Please return if you are not improving as expected, or if you have high fevers (>101.5) or difficulty swallowing or worsening productive cough. For dizziness- increase water intake by 1-2 glasses day to start.  Call clinic with questions.  Good to see you today. I hope you start feeling better soon.

## 2017-03-19 NOTE — Assessment & Plan Note (Signed)
Anticipate viral given short duration. She does have some R lobe rhonchi. Supportive care reviewed. Update if fever, worsening productive cough or not improving as expected. Pt and husband agree with plan.

## 2017-03-25 ENCOUNTER — Telehealth: Payer: Self-pay | Admitting: *Deleted

## 2017-03-25 MED ORDER — LOSARTAN POTASSIUM 100 MG PO TABS: 100.0000 mg | ORAL_TABLET | Freq: Every day | ORAL | 1 refills | Status: DC

## 2017-03-25 NOTE — Telephone Encounter (Signed)
Ok will send losartan 100mg  daily.

## 2017-03-25 NOTE — Telephone Encounter (Signed)
Patient notified

## 2017-03-25 NOTE — Telephone Encounter (Signed)
Spoke with patient. She said she has been taking 100mg  x2/weeks. She said her BP had been elevated at home, so she increased it on her own and feels fine. She said when she was in the office the other day, she had been taking 100mg . Do you want her to go back to 50mg  or continue 100mg ?

## 2017-03-25 NOTE — Telephone Encounter (Signed)
plz clarify with patient - last visit here she should have been on 50mg  losartan daily not 100mg ? We had increased from 25mg  to 50mg  due to high blood pressures - what is she currently taking?

## 2017-03-25 NOTE — Telephone Encounter (Signed)
Pt requesting medication refill of losartan.  She was advised to increase to 100mg  which she has done but is requesting Rx dosing be changed to 100mg  instead of 2tabs of 50mg .

## 2017-04-14 ENCOUNTER — Ambulatory Visit: Payer: Medicare Other | Admitting: Psychology

## 2017-04-28 ENCOUNTER — Ambulatory Visit: Payer: Medicare Other | Admitting: Psychology

## 2017-04-28 ENCOUNTER — Ambulatory Visit: Payer: Self-pay | Admitting: Internal Medicine

## 2017-04-29 ENCOUNTER — Ambulatory Visit (INDEPENDENT_AMBULATORY_CARE_PROVIDER_SITE_OTHER): Payer: Medicare Other | Admitting: Family Medicine

## 2017-04-29 ENCOUNTER — Encounter: Payer: Self-pay | Admitting: Family Medicine

## 2017-04-29 VITALS — BP 122/78 | HR 82 | Temp 99.0°F | Wt 163.1 lb

## 2017-04-29 DIAGNOSIS — B9689 Other specified bacterial agents as the cause of diseases classified elsewhere: Secondary | ICD-10-CM

## 2017-04-29 DIAGNOSIS — J208 Acute bronchitis due to other specified organisms: Secondary | ICD-10-CM

## 2017-04-29 MED ORDER — BENZONATATE 100 MG PO CAPS: 100.0000 mg | ORAL_CAPSULE | Freq: Three times a day (TID) | ORAL | 0 refills | Status: DC | PRN

## 2017-04-29 MED ORDER — AZITHROMYCIN 250 MG PO TABS: ORAL_TABLET | ORAL | 0 refills | Status: DC

## 2017-04-29 MED ORDER — ALBUTEROL SULFATE HFA 108 (90 BASE) MCG/ACT IN AERS: 2.0000 | INHALATION_SPRAY | Freq: Four times a day (QID) | RESPIRATORY_TRACT | 2 refills | Status: DC | PRN

## 2017-04-29 NOTE — Patient Instructions (Addendum)
Creo que tiene infecion de bronquitis bacterial  Use zpack antibiotico y use inhalador de albuterol 1-2 puffs cuando [place] para tos o dificultad de [employer] usar perlas de tessalon para la tos durante el dia.  Si no mejora con esto, regrese para rayos x de torax.  Bronquitis aguda en los adultos (Acute Bronchitis, Adult) La bronquitis aguda es la hinchazn repentina (aguda) de las vas areas (bronquios) en los pulmones. La bronquitis aguda hace que se llenen estas vas con mucosidad y provoca dificultad para respirar. Tambin puede causar tos o sibilancias. En los adultos, la bronquitis aguda generalmente desaparece en 2semanas. La tos provocada por la bronquitis puede durar [employer]. El hbito de fumar, las Set designer y el asma pueden empeorar esta afeccin. Los episodios repetidos de bronquitis pueden causar ms problemas pulmonares, como la enfermedad pulmonar obstructiva crnica (EPOC). CAUSAS Esta afeccin puede ser causada por grmenes y por sustancias que irritan los pulmones, por ejemplo:  Virus del resfro y de la gripe. La causa de la bronquitis suele ser el mismo virus que produce el resfro.  Bacterias.  Exposicin al humo del tabaco, polvo, gases y contaminacin del aire. FACTORES DE RIESGO Es ms probable que esta afeccin se manifieste en las personas que:  Tienen contacto cercano con alguien con bronquitis aguda.  Estn expuestas a sustancias que [employer], como el humo del [place], [place], gases y vapores.  Tienen un sistema inmunitario dbil.  Tienen una afeccin respiratoria, como el asma. SNTOMAS Los sntomas de esta afeccin incluyen lo siguiente:  Tos.  Despedir Ardelia Mems mucosidad transparente, amarilla o verde al toser.  Sibilancias.  Congestin en el pecho.  Falta de aire.  Cristy Hilts.  Dolores [employer] cuerpo.  Escalofros.  Dolor de Investment banker, operational. DIAGNSTICO Por lo general, esta afeccin se diagnostica mediante un examen fsico.  Durante el examen, el mdico puede solicitar estudios, como radiografas de trax, para Clinical research associate. El mdico tambin puede hacer lo siguiente:  Dion Body de mucosidad para detectar una infeccin bacteriana.  Confirmar el nivel de oxgeno en la sangre. Esto se hace para determinar si hay neumona.  Realizar una radiografa de trax o pruebas de la funcin pulmonar para descartar una neumona u otras afecciones.  Le pedir [employer]. El mdico tambin le preguntar sobre sus sntomas y los antecedentes mdicos. TRATAMIENTO La mayora de los casos de bronquitis [place] se recupera con el [place], sin tratamiento. El mdico podr indicar lo siguiente:  Beber ms lquidos. Beber ms cantidad de lquidos ayuda a diluir la mucosidad para Museum/gallery exhibitions officer respiracin.  Tomar un medicamento para la fiebre o la tos.  Tomar un antibitico.  Usar un inhalador para respirar mejor y Aeronautical engineer tos.  Usar un humidificador o vaporizador de niebla fra para facilitar la respiracin. [place] los medicamentos de venta libre y los recetados solamente como se lo haya indicado el mdico.  Si le recetaron un antibitico, tmelo como se lo haya indicado el mdico. No deje de tomar los antibiticos aunque comience a Sports administrator. Instrucciones generales  Descanse lo suficiente.  Beba suficiente lquido para Consulting civil engineer orina clara o de color amarillo plido.  Evite fumar o aspirar el humo de otros fumadores. La exposicin al humo del cigarrillo o a irritantes qumicos har que la bronquitis empeore. Si fuma y necesita ayuda para dejar de fumar, consulte al mdico. Si deja de fumar, sus pulmones se curarn ms rpido.  Utilice un  inhalador, un humidificador o un vaporizador de niebla fra, como se lo haya indicado el mdico.  Concurra a todas las visitas de control como se lo haya indicado el mdico. Esto es  importante. PREVENCIN Para disminuir el riesgo de volver a sufrir esta afeccin:  Lave sus manos frecuentemente con agua y [place]. Use desinfectante para manos si no dispone de [place] y [place].  Evite el contacto con personas que tienen sntomas de resfro.  Trate de no llevar las manos a la boca, la nariz o los ojos.  Recuerde aplicarse la vacuna contra la gripe todos los [place]. SOLICITE ATENCIN MDICA SI:  Los sntomas no mejoran despus de 2semanas de tratamiento. SOLICITE ATENCIN MDICA DE INMEDIATO SI:  Tose y escupe sangre.  Siente dolor en el pecho.  Le falta el aire de manera preocupante.  Se deshidrata.  Se desmaya o se siente como si se fuera a desmayar.  Sigue vomitando.  Tiene un dolor de cabeza intenso.  La fiebre o los escalofros empeoran. Esta informacin no tiene Marine scientist el consejo del mdico. Asegrese de hacerle al mdico cualquier pregunta que tenga. Document Released: 12/14/2005 Document Revised: 01/04/2015 Document Reviewed: 06/03/2016 Elsevier Interactive Patient Education  2017 [employer].

## 2017-04-29 NOTE — Assessment & Plan Note (Signed)
Anticipate bacterial given duration and progression of symptoms. Treat with zpack antibiotic, albuterol inhaler PRN dyspnea and cough (tachycardia precautions discussed, administration discussed), and tessalon perls for cough. Update if not improving with treatment, would have her return for CXR.

## 2017-04-29 NOTE — Progress Notes (Signed)
BP 122/78   Pulse 82   Temp 99 F (37.2 C) (Oral)   Wt 163 lb 1.9 oz (74 kg)   SpO2 96%   BMI 28.90 kg/m    CC: ongoing cough Subjective:    Patient ID: [name], female    DOB: Oct 11, 1935, 82 y.o.   MRN: [id number]  HPI: DESIA SABAN is a 82 y.o. female presenting on 04/29/2017 for Cough (Pt c/o having cough since Jan 2018. She reports symptoms are continuous and worsening.); Wheezing; and Nasal Congestion   Several month h/o cough - present since Feb 2018. Seen several times here, dx with viral bronchitis 01/2017 then viral URI 02/2017. Previously it was felt cough had fully improved between episodes, but pt states symptoms have been ongoing since initial visit. Acutely worse yesterday morning. Ongoing productive cough and chest congestion. In bed all day yesterday. Coughing fit this morning. Some wheezing and dyspnea. Some chills 2d ago. L earache.  No fevers, no headaches.  Treating with mucinex and OTC cough remedies, tea.   Relevant past medical, surgical, family and social history reviewed and updated as indicated. Interim medical history since our last visit reviewed. Allergies and medications reviewed and updated. Outpatient Medications Prior to Visit  Medication Sig Dispense Refill  . Ascorbic Acid (VITAMIN C CR PO) Take by mouth as directed. Reported on 12/17/2015    . calcipotriene-betamethasone (TACLONEX) ointment Apply 1 application topically daily.      Marland Kitchen CALCIUM CARBONATE PO Take 600 mg by mouth as directed. Reported on 12/17/2015    . Cholecalciferol (VITAMIN D) 2000 units CAPS Take 1 capsule (2,000 Units total) by mouth daily.    Marland Kitchen CICLOPIROX EX Apply topically as needed. Reported on 12/17/2015    . desonide (DESOWEN) 0.05 % cream APPLY TO AFFECTED AREA TWICE A DAY 30 g 1  . losartan (COZAAR) 100 MG tablet Take 1 tablet (100 mg total) by mouth daily. 90 tablet 1  . Multiple Vitamin (MULTIVITAMIN) tablet Take 1 tablet by mouth daily. Reported on 12/17/2015    .  NITROSTAT 0.4 MG SL tablet Reported on 04/16/2016    . sertraline (ZOLOFT) 25 MG tablet Take 1 tablet (25 mg total) by mouth daily. 30 tablet 3  . SYNTHROID 75 MCG tablet TAKE 1 TABLET BY MOUTH EVERY DAY 90 tablet 0  . Travoprost, BAK Free, (TRAVATAN) 0.004 % SOLN ophthalmic solution Place 1 drop into both eyes at bedtime.     No facility-administered medications prior to visit.      Per HPI unless specifically indicated in ROS section below Review of Systems     Objective:    BP 122/78   Pulse 82   Temp 99 F (37.2 C) (Oral)   Wt 163 lb 1.9 oz (74 kg)   SpO2 96%   BMI 28.90 kg/m   Wt Readings from Last 3 Encounters:  04/29/17 163 lb 1.9 oz (74 kg)  03/19/17 166 lb (75.3 kg)  02/19/17 162 lb (73.5 kg)    Physical Exam  Constitutional: She appears well-developed and well-nourished. No distress.  Tired and ill appearing but not toxic  HENT:  Head: Normocephalic and atraumatic.  Right Ear: Hearing, tympanic membrane, external ear and ear canal normal.  Left Ear: Hearing, tympanic membrane, external ear and ear canal normal.  Nose: Mucosal edema present. No rhinorrhea. Right sinus exhibits no maxillary sinus tenderness and no frontal sinus tenderness. Left sinus exhibits no maxillary sinus tenderness and no frontal sinus tenderness.  Mouth/Throat: Uvula is midline and mucous membranes are normal. Posterior oropharyngeal erythema present. No oropharyngeal exudate, posterior oropharyngeal edema or tonsillar abscesses.  Purulent mucous present L sinus  Eyes: Conjunctivae and EOM are normal. Pupils are equal, round, and reactive to light. No scleral icterus.  Neck: Normal range of motion. Neck supple.  Cardiovascular: Normal rate, regular rhythm, normal heart sounds and intact distal pulses.   No murmur heard. Pulmonary/Chest: Effort normal and breath sounds normal. No respiratory distress. She has no wheezes. She has no rales.  Coarse breath sounds without wheezing    Lymphadenopathy:    She has no cervical adenopathy.  Skin: Skin is warm and dry. No rash noted.  Nursing note and vitals reviewed.     Assessment & Plan:   Problem List Items Addressed This Visit    Acute bacterial bronchitis - Primary    Anticipate bacterial given duration and progression of symptoms. Treat with zpack antibiotic, albuterol inhaler PRN dyspnea and cough (tachycardia precautions discussed, administration discussed), and tessalon perls for cough. Update if not improving with treatment, would have her return for CXR.       Relevant Medications   azithromycin (ZITHROMAX) 250 MG tablet       Follow up plan: Return if symptoms worsen or fail to improve.  Ria Bush, MD

## 2017-05-05 ENCOUNTER — Ambulatory Visit
Admission: RE | Admit: 2017-05-05 | Discharge: 2017-05-05 | Disposition: A | Discharge status: Home / Self Care | Payer: Medicare Other | Source: Ambulatory Visit | Attending: Family Medicine | Admitting: Family Medicine

## 2017-05-05 ENCOUNTER — Encounter: Payer: Self-pay | Admitting: Family Medicine

## 2017-05-05 ENCOUNTER — Ambulatory Visit (INDEPENDENT_AMBULATORY_CARE_PROVIDER_SITE_OTHER)
Admission: RE | Admit: 2017-05-05 | Discharge: 2017-05-05 | Disposition: A | Discharge status: Home / Self Care | Payer: Medicare Other | Source: Ambulatory Visit | Attending: Family Medicine | Admitting: Family Medicine

## 2017-05-05 ENCOUNTER — Ambulatory Visit (INDEPENDENT_AMBULATORY_CARE_PROVIDER_SITE_OTHER): Payer: Medicare Other | Admitting: Family Medicine

## 2017-05-05 VITALS — BP 140/80 | HR 91 | Temp 98.2°F | Wt 164.0 lb

## 2017-05-05 DIAGNOSIS — M79662 Pain in left lower leg: Secondary | ICD-10-CM | POA: Insufficient documentation

## 2017-05-05 DIAGNOSIS — J208 Acute bronchitis due to other specified organisms: Secondary | ICD-10-CM

## 2017-05-05 DIAGNOSIS — R0602 Shortness of breath: Secondary | ICD-10-CM | POA: Diagnosis not present

## 2017-05-05 DIAGNOSIS — M7989 Other specified soft tissue disorders: Secondary | ICD-10-CM | POA: Insufficient documentation

## 2017-05-05 DIAGNOSIS — B9689 Other specified bacterial agents as the cause of diseases classified elsewhere: Secondary | ICD-10-CM

## 2017-05-05 DIAGNOSIS — R05 Cough: Secondary | ICD-10-CM | POA: Diagnosis not present

## 2017-05-05 DIAGNOSIS — M79606 Pain in leg, unspecified: Secondary | ICD-10-CM | POA: Insufficient documentation

## 2017-05-05 MED ORDER — LEVOFLOXACIN 500 MG PO TABS: 500.0000 mg | ORAL_TABLET | Freq: Every day | ORAL | 0 refills | Status: DC

## 2017-05-05 NOTE — Progress Notes (Signed)
BP 140/80   Pulse 91   Temp 98.2 F (36.8 C)   Wt 164 lb (74.4 kg)   SpO2 96%   BMI 29.05 kg/m    CC: f/u bronchitis Subjective:    Patient ID: TERRICKA ONOFRIO, female    DOB: 1935-10-23, 82 y.o.   MRN: [id number]  HPI: NATTIE LAZENBY is a 82 y.o. female presenting on 05/05/2017 for Cough and Cyst (left lower leg)   See prior note for details. Seen here last week, treated for acute bacterial bronchitis with zpack, tessalon perls, albuterol inhaler PRN. She completed antibiotic course.  Yesterday she did note some improvement. Persistent productive cough, malaise, fatigue, chest congestion. Very slow improvement. Pt endorses ongoing cough over last several months. Ongoing chest > nasal congestion. No fevers.   2 days she also noted lump anterior left leg. Tender to palpation. No pruritis. Denies inciting trauma/injury to leg. No recent insect bites. Known CVI with reflux s/p several venous procedures. She has been using silver sulfadiazine (silvadene) cream.   Relevant past medical, surgical, family and social history reviewed and updated as indicated. Interim medical history since our last visit reviewed. Allergies and medications reviewed and updated. Outpatient Medications Prior to Visit  Medication Sig Dispense Refill  . albuterol (PROVENTIL HFA;VENTOLIN HFA) 108 (90 Base) MCG/ACT inhaler Inhale 2 puffs into the lungs every 6 (six) hours as needed for wheezing or shortness of breath. 1 Inhaler 2  . Ascorbic Acid (VITAMIN C CR PO) Take by mouth as directed. Reported on 12/17/2015    . benzonatate (TESSALON) 100 MG capsule Take 1 capsule (100 mg total) by mouth 3 (three) times daily as needed for cough. 30 capsule 0  . calcipotriene-betamethasone (TACLONEX) ointment Apply 1 application topically daily.      Marland Kitchen CALCIUM CARBONATE PO Take 600 mg by mouth as directed. Reported on 12/17/2015    . Cholecalciferol (VITAMIN D) 2000 units CAPS Take 1 capsule (2,000 Units total) by mouth daily.      Marland Kitchen CICLOPIROX EX Apply topically as needed. Reported on 12/17/2015    . desonide (DESOWEN) 0.05 % cream APPLY TO AFFECTED AREA TWICE A DAY 30 g 1  . losartan (COZAAR) 100 MG tablet Take 1 tablet (100 mg total) by mouth daily. 90 tablet 1  . Multiple Vitamin (MULTIVITAMIN) tablet Take 1 tablet by mouth daily. Reported on 12/17/2015    . NITROSTAT 0.4 MG SL tablet Reported on 04/16/2016    . sertraline (ZOLOFT) 25 MG tablet Take 1 tablet (25 mg total) by mouth daily. 30 tablet 3  . SYNTHROID 75 MCG tablet TAKE 1 TABLET BY MOUTH EVERY DAY 90 tablet 0  . Travoprost, BAK Free, (TRAVATAN) 0.004 % SOLN ophthalmic solution Place 1 drop into both eyes at bedtime.    Marland Kitchen azithromycin (ZITHROMAX) 250 MG tablet Take two tablets on day one followed by one tablet on days 2-5 (Patient not taking: Reported on 05/05/2017) 6 each 0   No facility-administered medications prior to visit.      Per HPI unless specifically indicated in ROS section below Review of Systems     Objective:    BP 140/80   Pulse 91   Temp 98.2 F (36.8 C)   Wt 164 lb (74.4 kg)   SpO2 96%   BMI 29.05 kg/m   Wt Readings from Last 3 Encounters:  05/05/17 164 lb (74.4 kg)  04/29/17 163 lb 1.9 oz (74 kg)  03/19/17 166 lb (75.3 kg)  Physical Exam  Constitutional: She appears well-developed and well-nourished. No distress.  HENT:  Head: Normocephalic and atraumatic.  Right Ear: Hearing normal.  Left Ear: Hearing normal.  Mouth/Throat: Uvula is midline, oropharynx is clear and moist and mucous membranes are normal. No oropharyngeal exudate, posterior oropharyngeal edema, posterior oropharyngeal erythema or tonsillar abscesses.  Eyes: Conjunctivae and EOM are normal. Pupils are equal, round, and reactive to light. No scleral icterus.  Neck: Normal range of motion. Neck supple.  Cardiovascular: Normal rate, regular rhythm, normal heart sounds and intact distal pulses.   No murmur heard. Pulmonary/Chest: Effort normal. No  respiratory distress. She has decreased breath sounds. She has no wheezes. She has rhonchi (faint). She has no rales.  Mild crackles bibasilarly  Musculoskeletal:  L calf circ 36cm R calf circ 36cm 2+ DP bilaterally Chronic hemosiderin deposition skin changes bilateral medial lower legs L lower leg with tender swelling and induration medially No palpable cords at calves  Lymphadenopathy:    She has no cervical adenopathy.  Skin: Skin is warm and dry. No rash noted.  Nursing note and vitals reviewed.  Results for orders placed or performed in visit on 11/04/16  Fecal Occult Blood, Guaiac  Result Value Ref Range   Fecal Occult Blood Negative    Lab Results  Component Value Date   CREATININE 0.67 08/12/2016       Assessment & Plan:   Problem List Items Addressed This Visit    Acute bacterial bronchitis - Primary    Slow improvement despite completing zpack antibiotic. No fever but ongoing productive cough, malaise, coarse breath sounds. Will expand coverage to levaquin 500mg  7d course.  Continue tessalon, albuterol inhaler PRN, and add plain mucinex expectorant.  Check CXR today - overall clear on my read.  Update if not improving with treatment.       Relevant Orders   DG Chest 2 View   Pain and swelling of left lower leg    New. h/o CVI. Doubt DVT. Given swelling, tenderness and asymmetry noted, check venous US to r/o DVT. Once ruled out, will recommend conservative measures of elevation, increased water, consider grade I (20-62mmHg) compression stockings. Will call with results.       Relevant Orders   US Venous Img Lower Unilateral Left       Follow up plan: No Follow-up on file.  Ria Bush, MD

## 2017-05-05 NOTE — Assessment & Plan Note (Addendum)
New. h/o CVI. Doubt DVT. Given swelling, tenderness and asymmetry noted, check venous US to r/o DVT. Once ruled out, will recommend conservative measures of elevation, increased water, consider grade I (20-29mmHg) compression stockings. Will call with results.

## 2017-05-05 NOTE — Assessment & Plan Note (Signed)
Slow improvement despite completing zpack antibiotic. No fever but ongoing productive cough, malaise, coarse breath sounds. Will expand coverage to levaquin 500mg  7d course.  Continue tessalon, albuterol inhaler PRN, and add plain mucinex expectorant.  Check CXR today - overall clear on my read.  Update if not improving with treatment.

## 2017-05-05 NOTE — Progress Notes (Signed)
Pre visit review using our clinic review tool, if applicable. No additional management support is needed unless otherwise documented below in the visit note. 

## 2017-05-05 NOTE — Patient Instructions (Addendum)
Xray today - no obvious pneumonia I would like you to take levaquin antibiotic. Continue albuterol and tessalon perls.  We will set you up for leg ultrasound to rule out blood clot. See Rosaria Ferries on your way out.  May take plain mucinex over the counter with large glass of water to help mobilize mucous.

## 2017-05-12 ENCOUNTER — Ambulatory Visit: Payer: Medicare Other | Admitting: Psychology

## 2017-06-24 ENCOUNTER — Ambulatory Visit: Payer: Medicare Other | Admitting: Family Medicine

## 2017-06-25 ENCOUNTER — Ambulatory Visit (INDEPENDENT_AMBULATORY_CARE_PROVIDER_SITE_OTHER): Payer: Medicare Other | Admitting: Family Medicine

## 2017-06-25 VITALS — BP 110/80 | HR 86 | Wt 166.5 lb

## 2017-06-25 DIAGNOSIS — E039 Hypothyroidism, unspecified: Secondary | ICD-10-CM | POA: Diagnosis not present

## 2017-06-25 DIAGNOSIS — E559 Vitamin D deficiency, unspecified: Secondary | ICD-10-CM | POA: Diagnosis not present

## 2017-06-25 DIAGNOSIS — R5383 Other fatigue: Secondary | ICD-10-CM

## 2017-06-25 LAB — CBC WITH DIFFERENTIAL/PLATELET
BASOS PCT: 0.4 % (ref 0.0–3.0)
Basophils Absolute: 0 10*3/uL (ref 0.0–0.1)
EOS ABS: 0.1 10*3/uL (ref 0.0–0.7)
Eosinophils Relative: 2 % (ref 0.0–5.0)
HEMATOCRIT: 39.9 % (ref 36.0–46.0)
HEMOGLOBIN: 13.4 g/dL (ref 12.0–15.0)
LYMPHS PCT: 21.5 % (ref 12.0–46.0)
Lymphs Abs: 1.3 10*3/uL (ref 0.7–4.0)
MCHC: 33.5 g/dL (ref 30.0–36.0)
MCV: 87.5 fl (ref 78.0–100.0)
MONOS PCT: 12.1 % — AB (ref 3.0–12.0)
Monocytes Absolute: 0.7 10*3/uL (ref 0.1–1.0)
Neutro Abs: 3.8 10*3/uL (ref 1.4–7.7)
Neutrophils Relative %: 64 % (ref 43.0–77.0)
Platelets: 215 10*3/uL (ref 150.0–400.0)
RBC: 4.56 Mil/uL (ref 3.87–5.11)
RDW: 14.1 % (ref 11.5–15.5)
WBC: 5.9 10*3/uL (ref 4.0–10.5)

## 2017-06-25 LAB — COMPREHENSIVE METABOLIC PANEL
ALT: 13 U/L (ref 0–35)
AST: 18 U/L (ref 0–37)
Albumin: 4.3 g/dL (ref 3.5–5.2)
Alkaline Phosphatase: 76 U/L (ref 39–117)
BILIRUBIN TOTAL: 0.5 mg/dL (ref 0.2–1.2)
BUN: 18 mg/dL (ref 6–23)
CALCIUM: 9.3 mg/dL (ref 8.4–10.5)
CHLORIDE: 106 meq/L (ref 96–112)
CO2: 29 meq/L (ref 19–32)
Creatinine, Ser: 0.67 mg/dL (ref 0.40–1.20)
GFR: 89.98 mL/min (ref >60.00)
Glucose, Bld: 82 mg/dL (ref 70–99)
POTASSIUM: 4.2 meq/L (ref 3.5–5.1)
Sodium: 140 mEq/L (ref 135–145)
Total Protein: 7.2 g/dL (ref 6.0–8.3)

## 2017-06-25 LAB — VITAMIN D 25 HYDROXY (VIT D DEFICIENCY, FRACTURES): VITD: 24.57 ng/mL — ABNORMAL LOW (ref 30.00–100.00)

## 2017-06-25 LAB — TSH: TSH: 1.2 u[IU]/mL (ref 0.35–4.50)

## 2017-06-25 NOTE — Assessment & Plan Note (Addendum)
Ongoing fatigue clarified to mean daytime somnolence but she doesn't have significant story for OSA. Check for reversible causes of fatigue today with labwork. Possible stress/anxiety contribution. Discussed sleep hygiene and encouraged try to avoid daytime naps.

## 2017-06-25 NOTE — Assessment & Plan Note (Signed)
Update levels. 

## 2017-06-25 NOTE — Progress Notes (Signed)
BP 110/80   Pulse 86   Wt 166 lb 8 oz (75.5 kg)   SpO2 96%   BMI 29.49 kg/m    CC: fatigue Subjective:    Patient ID: Beth Rodgers, female    DOB: Jun 11, 1935, 82 y.o.   MRN: [id number]  HPI: Beth Rodgers is a 82 y.o. female presenting on 06/25/2017 for Fatigue and SLEEPING TOO MUCH   Increasing daytime somnolence noted needing several 30-6min naps per day. Not true fatigue. Trouble sleeping at night due to daytime naps.  She also feels somewhat unsteady on feet.  Drinks 3 cups cofffee/day.   Unsure if snoring at night time. No PNDyspnea. No witnessed apneic events. Endorses restorative sleep.  She does use dental guard.   She does remain worried about her son who has mental health illness.   Relevant past medical, surgical, family and social history reviewed and updated as indicated. Interim medical history since our last visit reviewed. Allergies and medications reviewed and updated. Outpatient Medications Prior to Visit  Medication Sig Dispense Refill  . Ascorbic Acid (VITAMIN C CR PO) Take by mouth as directed. Reported on 12/17/2015    . CALCIUM CARBONATE PO Take 600 mg by mouth as directed. Reported on 12/17/2015    . Cholecalciferol (VITAMIN D) 2000 units CAPS Take 1 capsule (2,000 Units total) by mouth daily.    Marland Kitchen CICLOPIROX EX Apply topically as needed. Reported on 12/17/2015    . desonide (DESOWEN) 0.05 % cream APPLY TO AFFECTED AREA TWICE A DAY 30 g 1  . losartan (COZAAR) 100 MG tablet Take 1 tablet (100 mg total) by mouth daily. 90 tablet 1  . Multiple Vitamin (MULTIVITAMIN) tablet Take 1 tablet by mouth daily. Reported on 12/17/2015    . NITROSTAT 0.4 MG SL tablet Reported on 04/16/2016    . sertraline (ZOLOFT) 25 MG tablet Take 1 tablet (25 mg total) by mouth daily. 30 tablet 3  . SYNTHROID 75 MCG tablet TAKE 1 TABLET BY MOUTH EVERY DAY 90 tablet 0  . Travoprost, BAK Free, (TRAVATAN) 0.004 % SOLN ophthalmic solution Place 1 drop into both eyes at bedtime.    Marland Kitchen  albuterol (PROVENTIL HFA;VENTOLIN HFA) 108 (90 Base) MCG/ACT inhaler Inhale 2 puffs into the lungs every 6 (six) hours as needed for wheezing or shortness of breath. (Patient not taking: Reported on 06/25/2017) 1 Inhaler 2  . benzonatate (TESSALON) 100 MG capsule Take 1 capsule (100 mg total) by mouth 3 (three) times daily as needed for cough. (Patient not taking: Reported on 06/25/2017) 30 capsule 0  . calcipotriene-betamethasone (TACLONEX) ointment Apply 1 application topically daily.      Marland Kitchen levofloxacin (LEVAQUIN) 500 MG tablet Take 1 tablet (500 mg total) by mouth daily. (Patient not taking: Reported on 06/25/2017) 7 tablet 0   No facility-administered medications prior to visit.      Per HPI unless specifically indicated in ROS section below Review of Systems     Objective:    BP 110/80   Pulse 86   Wt 166 lb 8 oz (75.5 kg)   SpO2 96%   BMI 29.49 kg/m   Wt Readings from Last 3 Encounters:  06/25/17 166 lb 8 oz (75.5 kg)  05/05/17 164 lb (74.4 kg)  04/29/17 163 lb 1.9 oz (74 kg)    Physical Exam  Constitutional: She appears well-developed and well-nourished. No distress.  HENT:  Mouth/Throat: Oropharynx is clear and moist. No oropharyngeal exudate.  Eyes: Conjunctivae are normal. Pupils are  equal, round, and reactive to light.  Neck: No thyromegaly present.  Cardiovascular: Normal rate, regular rhythm, normal heart sounds and intact distal pulses.   No murmur heard. Pulmonary/Chest: Effort normal and breath sounds normal. No respiratory distress. She has no wheezes. She has no rales.  Musculoskeletal: She exhibits no edema.  Skin: Skin is warm and dry. No rash noted.  Psychiatric: She has a normal mood and affect.  Nursing note and vitals reviewed.  Results for orders placed or performed in visit on 11/04/16  Fecal Occult Blood, Guaiac  Result Value Ref Range   Fecal Occult Blood Negative       Assessment & Plan:   Problem List Items Addressed This Visit    Fatigue -  Primary    Ongoing fatigue clarified to mean daytime somnolence but she doesn't have significant story for OSA. Check for reversible causes of fatigue today with labwork. Possible stress/anxiety contribution. Discussed sleep hygiene and encouraged try to avoid daytime naps.       Relevant Orders   Comprehensive metabolic panel   TSH   CBC with Differential/Platelet   VITAMIN D 25 Hydroxy (Vit-D Deficiency, Fractures)   Hypothyroidism    Update labs.       Vitamin D deficiency    Update levels.       Relevant Orders   VITAMIN D 25 Hydroxy (Vit-D Deficiency, Fractures)       Follow up plan: Return if symptoms worsen or fail to improve.  Ria Bush, MD

## 2017-06-25 NOTE — Assessment & Plan Note (Signed)
Update labs.  

## 2017-06-25 NOTE — Progress Notes (Signed)
Pre visit review using our clinic review tool, if applicable. No additional management support is needed unless otherwise documented below in the visit note. 

## 2017-06-25 NOTE — Patient Instructions (Signed)
Laboratorios hoy.  La llamaremos con [employer].  Gusto verla hoy!

## 2017-07-05 DIAGNOSIS — H401131 Primary open-angle glaucoma, bilateral, mild stage: Secondary | ICD-10-CM | POA: Diagnosis not present

## 2017-07-27 DIAGNOSIS — H10502 Unspecified blepharoconjunctivitis, left eye: Secondary | ICD-10-CM | POA: Diagnosis not present

## 2017-08-22 ENCOUNTER — Other Ambulatory Visit: Payer: Self-pay | Admitting: Family Medicine

## 2017-08-27 ENCOUNTER — Other Ambulatory Visit: Payer: Self-pay | Admitting: Family Medicine

## 2017-08-29 ENCOUNTER — Other Ambulatory Visit: Payer: Self-pay | Admitting: Family Medicine

## 2017-08-31 ENCOUNTER — Other Ambulatory Visit: Payer: Self-pay

## 2017-08-31 MED ORDER — LOSARTAN POTASSIUM 100 MG PO TABS: 100.0000 mg | ORAL_TABLET | Freq: Every day | ORAL | 1 refills | Status: DC

## 2017-08-31 NOTE — Telephone Encounter (Signed)
Pt request refill losartan 100 mg taking one daily; pt said she has 15 pills left. Spoke with Ron at [facility] and he said pt got filled at end of March and Middle of June. Pt request to get refilled so will not be out of med. Refilled per protocol and pt will cb for f/u appt 01/2018.

## 2017-09-22 DIAGNOSIS — H903 Sensorineural hearing loss, bilateral: Secondary | ICD-10-CM | POA: Diagnosis not present

## 2017-09-22 DIAGNOSIS — H6980 Other specified disorders of Eustachian tube, unspecified ear: Secondary | ICD-10-CM | POA: Diagnosis not present

## 2017-09-24 ENCOUNTER — Ambulatory Visit: Payer: Self-pay | Admitting: Family Medicine

## 2017-10-11 ENCOUNTER — Telehealth: Payer: Self-pay

## 2017-10-11 NOTE — Telephone Encounter (Signed)
Agree  Thank you

## 2017-10-11 NOTE — Telephone Encounter (Signed)
Patient calls in to report that she is feeling poorly X 3 days: malaise, fatigue, weakness and body aches with increased heart burn.  She denies any SOB, CP, dizziness, headaches or fever.    Husband was in hospital last week with sepsis and she has been exposed to "sick" people.  When given the choice, patient preferred to see Dr. Darnell Level on Thursday when husband comes in rather than seeing NP tomorrow.  Dr. Darnell Level does have an opening at 1115, husband's appt is at 1200.  She will take that time, rest and monitor her symptoms in the meantime.    She will increase fluid intake, treat with OTC meds as appropriate and monitor for fever.  If symptoms worsen she will call and let us know or go to ER as needed.

## 2017-10-14 ENCOUNTER — Ambulatory Visit: Payer: Self-pay | Admitting: Family Medicine

## 2017-10-27 ENCOUNTER — Telehealth: Payer: Self-pay | Admitting: Family Medicine

## 2017-10-27 NOTE — Telephone Encounter (Signed)
I'm not sure what is needed. Please advise.

## 2017-11-15 DIAGNOSIS — H401131 Primary open-angle glaucoma, bilateral, mild stage: Secondary | ICD-10-CM | POA: Diagnosis not present

## 2017-11-23 DIAGNOSIS — H401131 Primary open-angle glaucoma, bilateral, mild stage: Secondary | ICD-10-CM | POA: Diagnosis not present

## 2017-11-25 ENCOUNTER — Ambulatory Visit: Payer: Self-pay

## 2017-11-25 NOTE — Telephone Encounter (Signed)
Rec'd call from pt. with c/o nasal congestion and "occasional" cough x 2 days.  Denies fever, sore throat, or shortness of breath.  Stated she has thick yellow mucus with light blood tinge, when she blows nose.  Stated she is coughing up yellow mucus, occasionally.  Reported she doesn't feel bad; has remained active.  Stated she will be taking a very long plane trip, in about 2 weeks, and wanted to prevent getting more sick, prior to trip. Reviewed care advice for cold/ cough; verbalized understanding.  Encouraged to call back with worsening of symptoms per protocol.  Verbalized understanding.          Reason for Disposition . Cold with no complications . Care advice for mild cough, questions about  Answer Assessment - Initial Assessment Questions 1. ONSET: "When did the nasal discharge start?"      2 days ago 2. AMOUNT: "How much discharge is there?"      Some yellow mucus from nostrils 3. COUGH: "Do you have a cough?" If yes, ask: "Describe the color of your sputum" (clear, white, yellow, green)     Coughing up yellow phlegm occasionally 4. RESPIRATORY DISTRESS: "Describe your breathing."      No shortness of breath; able to exercise without stopping this AM 5. FEVER: "Do you have a fever?" If so, ask: "What is your temperature, how was it measured, and when did it start?"     no 6. SEVERITY: "Overall, how bad are you feeling right now?" (e.g., doesn't interfere with normal activities, staying home from school/work, staying in bed)      I'm not slowing down; able to do her usual activities 7. OTHER SYMPTOMS: "Do you have any other symptoms?" (e.g., sore throat, earache, wheezing, vomiting)     Denied earache, sore throat, or wheezing.  8. PREGNANCY: "Is there any chance you are pregnant?" "When was your last menstrual period?"     n/a  Protocols used: COMMON COLD-A-AH

## 2018-02-04 ENCOUNTER — Telehealth: Payer: Self-pay

## 2018-02-04 NOTE — Telephone Encounter (Signed)
PA Completed on CoverMyMeds

## 2018-02-18 ENCOUNTER — Other Ambulatory Visit: Payer: Self-pay | Admitting: Family Medicine

## 2018-03-06 ENCOUNTER — Other Ambulatory Visit: Payer: Self-pay | Admitting: Family Medicine

## 2018-03-06 DIAGNOSIS — E039 Hypothyroidism, unspecified: Secondary | ICD-10-CM

## 2018-03-06 DIAGNOSIS — E559 Vitamin D deficiency, unspecified: Secondary | ICD-10-CM

## 2018-03-09 ENCOUNTER — Other Ambulatory Visit: Payer: Self-pay | Admitting: Family Medicine

## 2018-03-09 ENCOUNTER — Ambulatory Visit: Payer: Self-pay

## 2018-03-09 ENCOUNTER — Ambulatory Visit (INDEPENDENT_AMBULATORY_CARE_PROVIDER_SITE_OTHER): Payer: Medicare Other

## 2018-03-09 VITALS — BP 122/80 | HR 67 | Temp 97.9°F | Ht 63.5 in | Wt 162.5 lb

## 2018-03-09 DIAGNOSIS — Z0000 Encounter for general adult medical examination without abnormal findings: Secondary | ICD-10-CM | POA: Diagnosis not present

## 2018-03-09 DIAGNOSIS — E559 Vitamin D deficiency, unspecified: Secondary | ICD-10-CM | POA: Diagnosis not present

## 2018-03-09 DIAGNOSIS — E039 Hypothyroidism, unspecified: Secondary | ICD-10-CM

## 2018-03-09 LAB — VITAMIN D 25 HYDROXY (VIT D DEFICIENCY, FRACTURES): VITD: 20.16 ng/mL — ABNORMAL LOW (ref 30.00–100.00)

## 2018-03-09 LAB — BASIC METABOLIC PANEL
BUN: 13 mg/dL (ref 6–23)
CALCIUM: 9.1 mg/dL (ref 8.4–10.5)
CO2: 28 meq/L (ref 19–32)
CREATININE: 0.62 mg/dL (ref 0.40–1.20)
Chloride: 106 mEq/L (ref 96–112)
GFR: 98.24 mL/min (ref >60.00)
Glucose, Bld: 83 mg/dL (ref 70–99)
Potassium: 4.1 mEq/L (ref 3.5–5.1)
Sodium: 140 mEq/L (ref 135–145)

## 2018-03-09 LAB — TSH: TSH: 4.04 u[IU]/mL (ref 0.35–4.50)

## 2018-03-09 NOTE — Progress Notes (Signed)
Subjective:   Beth Rodgers is a 83 y.o. female who presents for Medicare Annual (Subsequent) preventive examination.  Review of Systems:  N/A Cardiac Risk Factors include: advanced age (>67men, >70 women);hypertension     Objective:     Vitals: BP 122/80 (BP Location: Right Arm, Patient Position: Sitting, Cuff Size: Normal)   Pulse 67   Temp 97.9 F (36.6 C) (Oral)   Ht 5' 3.5" (1.613 m) Comment: no shoes  Wt 162 lb 8 oz (73.7 kg)   SpO2 97%   BMI 28.33 kg/m   Body mass index is 28.33 kg/m.  Advanced Directives 03/09/2018 02/19/2017  Does Patient Have a Medical Advance Directive? No No  Would patient like information on creating a medical advance directive? No - Patient declined -    Tobacco Social History   Tobacco Use  Smoking Status Never Smoker  Smokeless Tobacco Never Used     Counseling given: No   Clinical Intake:  Pre-visit preparation completed: Yes  Pain : No/denies pain Pain Score: 0-No pain     Nutritional Status: BMI 25 -29 Overweight Nutritional Risks: None Diabetes: No  How often do you need to have someone help you when you read instructions, pamphlets, or other written materials from your doctor or pharmacy?: 1 - Never What is the last grade level you completed in school?: Bachelor degree  Interpreter Needed?: No  Comments: pt lives with spouse Information entered by :: LPinson, LPN  Past Medical History:  Diagnosis Date  . Chronic venous insufficiency    with varicose veins  . Glaucoma   . History  of basal cell carcinoma    left nare  . History of chicken pox   . Hypothyroid   . Osteoarthritis    back pain, L knee pain  . Osteopenia 06/2009   DEXA 11/2015: T -2.3 hip, -2.2 spine, on longterm bisphosphonate/evista  . Pyogenic granuloma 04/16/2016   Past Surgical History:  Procedure Laterality Date  . BREAST BIOPSY     core  . CATARACT EXTRACTION     bilateral  . DEXA  06/2009   T score Spine: -1.8, hip -2.0  . Foam  sclerotherapy Bilateral 09/2015   [facility] [place], [place]  . NASAL RECONSTRUCTION  2005   for BCC L nare s/p Mohs  . nuclear stress test  2014   normal in [place]  . RADIOFREQUENCY ABLATION Left 01/2012   remnant L G saphenous vein below knee  . RADIOFREQUENCY ABLATION Right 02/2013   RLE vein ablation   . VEIN LIGATION AND STRIPPING  remote   bilateral G saphenous veins   Family History  Problem Relation Age of Onset  . Cancer Mother        thyroid cancer  . Heart disease Father        CHF  . Diabetes Father   . Glaucoma Father   . Arthritis Father   . Coronary artery disease Maternal Uncle   . Bipolar disorder Son   . Stroke Neg Hx   . Breast cancer Neg Hx    Social History   Socioeconomic History  . Marital status: Unknown    Spouse name: None  . Number of children: 2  . Years of education: None  . Highest education level: None  Social Needs  . Financial resource strain: None  . Food insecurity - worry: None  . Food insecurity - inability: None  . Transportation needs - medical: None  . Transportation needs - non-medical: None  Occupational History    Employer: RETIRED  Tobacco Use  . Smoking status: Never Smoker  . Smokeless tobacco: Never Used  Substance and Sexual Activity  . Alcohol use: Yes    Comment: Occasionally drinks wine  . Drug use: No  . Sexual activity: Not Currently    Birth control/protection: Post-menopausal  Other Topics Concern  . None  Social History Narrative   From [place], [place]   Caffeine: 2-3 cups/day   Lives with husband, majority of time alone as he travels to [place].   Has grandchildren nearby.   Occ: Field seismologist, Metallurgist   Activity: bed exercises, limiting walking   Diet: does get fruits and vegetables, only some water    Outpatient Encounter Medications as of 03/09/2018  Medication Sig  . Ascorbic Acid (VITAMIN C CR PO) Take by mouth as directed. Reported on 12/17/2015  . CALCIUM CARBONATE PO Take 600 mg  by mouth as directed. Reported on 12/17/2015  . Cholecalciferol (VITAMIN D) 2000 units CAPS Take 1 capsule (2,000 Units total) by mouth daily.  Marland Kitchen CICLOPIROX EX Apply topically as needed. Reported on 12/17/2015  . desonide (DESOWEN) 0.05 % cream APPLY TO AFFECTED AREA TWICE A DAY  . Multiple Vitamin (MULTIVITAMIN) tablet Take 1 tablet by mouth daily. Reported on 12/17/2015  . NITROSTAT 0.4 MG SL tablet Reported on 04/16/2016  . SYNTHROID 75 MCG tablet TAKE 1 TABLET BY MOUTH EVERY DAY  . Travoprost, BAK Free, (TRAVATAN) 0.004 % SOLN ophthalmic solution Place 1 drop into both eyes at bedtime.  . [DISCONTINUED] losartan (COZAAR) 100 MG tablet Take 1 tablet (100 mg total) by mouth daily.  . [DISCONTINUED] sertraline (ZOLOFT) 25 MG tablet Take 1 tablet (25 mg total) by mouth daily.   No facility-administered encounter medications on file as of 03/09/2018.     Activities of Daily Living In your present state of health, do you have any difficulty performing the following activities: 03/09/2018  Hearing? N  Vision? N  Difficulty concentrating or making decisions? N  Walking or climbing stairs? Y  Dressing or bathing? N  Doing errands, shopping? N  Preparing Food and eating ? N  Using the Toilet? N  In the past six months, have you accidently leaked urine? N  Do you have problems with loss of bowel control? N  Managing your Medications? N  Managing your Finances? N  Housekeeping or managing your Housekeeping? N  Some recent data might be hidden    Patient Care Team: Ria Bush, MD as PCP - General (Family Medicine)    Assessment:   This is a routine wellness examination for Beth Rodgers.   Hearing Screening   125Hz  250Hz  500Hz  1000Hz  2000Hz  3000Hz  4000Hz  6000Hz  8000Hz   Right ear:   40 40 40  40    Left ear:   40 40 40  40    Vision Screening Comments: Last vision exam in Sept 2018 @ [facility]  Exercise Activities and Dietary recommendations Current Exercise Habits: Home  exercise routine, Type of exercise: stretching, Time (Minutes): 40, Frequency (Times/Week): 7, Weekly Exercise (Minutes/Week): 280, Intensity: Mild, Exercise limited by: None identified  Goals    . Increase physical activity     Starting 03/09/2018, I will continue to stretch for 40 minutes daily.        Fall Risk Fall Risk  03/09/2018 02/19/2017 10/23/2016 12/17/2015 10/30/2014  Falls in the past year? No No No No No  Number falls in past yr: - - - - -  Injury with Fall? - - - - -   Depression Screen PHQ 2/9 Scores 03/09/2018 02/19/2017 12/18/2016 10/23/2016  PHQ - 2 Score 0 0 2 0  PHQ- 9 Score 0 - 3 -     Cognitive Function MMSE - Mini Mental State Exam 03/09/2018 02/19/2017  Orientation to time 5 5  Orientation to Place 5 5  Registration 3 3  Attention/ Calculation 0 0  Recall 3 3  Language- name 2 objects 0 0  Language- repeat 1 1  Language- follow 3 step command 3 3  Language- read & follow direction 0 0  Write a sentence 0 0  Copy design 0 0  Total score 20 20     PLEASE NOTE: A Mini-Cog screen was completed. Maximum score is 20. A value of 0 denotes this part of Folstein MMSE was not completed or the patient failed this part of the Mini-Cog screening.   Mini-Cog Screening Orientation to Time - Max 5 pts Orientation to Place - Max 5 pts Registration - Max 3 pts Recall - Max 3 pts Language Repeat - Max 1 pts Language Follow 3 Step Command - Max 3 pts     Immunization History  Administered Date(s) Administered  . Hep A / Hep B 06/05/2015, 12/17/2015  . Influenza Split 11/15/2012  . Influenza, High Dose Seasonal PF 11/14/2014  . Influenza,inj,Quad PF,6+ Mos 09/06/2013, 12/18/2016  . Pneumococcal Conjugate-13 10/30/2014  . Pneumococcal Polysaccharide-23 11/15/2012  . Td 05/24/2012  . Zoster 07/03/2014    Screening Tests Health Maintenance  Topic Date Due  . INFLUENZA VACCINE  11/09/2018 (Originally 07/28/2017)  . DTaP/Tdap/Td (1 - Tdap) 05/24/2022 (Originally  05/25/2012)  . TETANUS/TDAP  05/24/2022  . DEXA SCAN  Completed  . PNA vac Low Risk Adult  Completed     Plan:   I have personally reviewed, addressed, and noted the following in the patient's chart:  A. Medical and social history B. Use of alcohol, tobacco or illicit drugs  C. Current medications and supplements D. Functional ability and status E.  Nutritional status F.  Physical activity G. Advance directives H. List of other physicians I.  Hospitalizations, surgeries, and ER visits in previous 12 months J.  [place] to include hearing, vision, cognitive, depression L. Referrals and appointments - none  In addition, I have reviewed and discussed with patient certain preventive protocols, quality metrics, and best practice recommendations. A written personalized care plan for preventive services as well as general preventive health recommendations were provided to patient.  See attached scanned questionnaire for additional information.   Signed,   Lindell Noe, MHA, BS, LPN Health Coach

## 2018-03-09 NOTE — Progress Notes (Signed)
PCP notes:   Health maintenance:  Flu vaccine - per pt, she may have had vaccine but cannot recall definitively  Abnormal screenings:   None  Patient concerns:   None   Nurse concerns:  None  Next PCP appt:   03/16/2018 @ 1130  I reviewed health advisor's note, was available for consultation, and agree with documentation and plan. Loura Pardon MD

## 2018-03-09 NOTE — Patient Instructions (Signed)
Beth Rodgers , Thank you for taking time to come for your Medicare Wellness Visit. I appreciate your ongoing commitment to your health goals. Please review the following plan we discussed and let me know if I can assist you in the future.   These are the goals we discussed: Goals    . Increase physical activity     Starting 03/09/2018, I will continue to stretch for 40 minutes daily.        This is a list of the screening recommended for you and due dates:  Health Maintenance  Topic Date Due  . Flu Shot  11/09/2018*  . DTaP/Tdap/Td vaccine (1 - Tdap) 05/24/2022*  . Tetanus Vaccine  05/24/2022  . DEXA scan (bone density measurement)  Completed  . Pneumonia vaccines  Completed  *Topic was postponed. The date shown is not the original due date.   Preventive Care for Adults  A healthy lifestyle and preventive care can promote health and wellness. Preventive health guidelines for adults include the following key practices.  . A routine yearly physical is a good way to check with your health care provider about your health and preventive screening. It is a chance to share any concerns and updates on your health and to receive a thorough exam.  . Visit your dentist for a routine exam and preventive care every 6 months. Brush your teeth twice a day and floss once a day. Good oral hygiene prevents tooth decay and gum disease.  . The frequency of eye exams is based on your age, health, family medical history, use  of contact lenses, and other factors. Follow your health care provider's recommendations for frequency of eye exams.  . Eat a healthy diet. Foods like vegetables, fruits, whole grains, low-fat dairy products, and lean protein foods contain the nutrients you need without too many calories. Decrease your intake of foods high in solid fats, added sugars, and salt. Eat the right amount of calories for you. Get information about a proper diet from your health care provider, if  necessary.  . Regular physical exercise is one of the most important things you can do for your health. Most adults should get at least 150 minutes of moderate-intensity exercise (any activity that increases your heart rate and causes you to sweat) each week. In addition, most adults need muscle-strengthening exercises on 2 or more days a week.  Silver Sneakers may be a benefit available to you. To determine eligibility, you may visit the website: www.silversneakers.com or contact program at [phone] Mon-Fri between 8AM-8PM.   . Maintain a healthy weight. The body mass index (BMI) is a screening tool to identify possible weight problems. It provides an estimate of body fat based on height and weight. Your health care provider can find your BMI and can help you achieve or maintain a healthy weight.   For adults 20 years and older: ? A BMI below 18.5 is considered underweight. ? A BMI of 18.5 to 24.9 is normal. ? A BMI of 25 to 29.9 is considered overweight. ? A BMI of 30 and above is considered obese.   . Maintain normal blood lipids and cholesterol levels by exercising and minimizing your intake of saturated fat. Eat a balanced diet with plenty of fruit and vegetables. Blood tests for lipids and cholesterol should begin at age 78 and be repeated every 5 years. If your lipid or cholesterol levels are high, you are over 50, or you are at high risk for heart disease, you  may need your cholesterol levels checked more frequently. Ongoing high lipid and cholesterol levels should be treated with medicines if diet and exercise are not working.  . If you smoke, find out from your health care provider how to quit. If you do not use tobacco, please do not start.  . If you choose to drink alcohol, please do not consume more than 2 drinks per day. One drink is considered to be 12 ounces (355 mL) of beer, 5 ounces (148 mL) of wine, or 1.5 ounces (44 mL) of liquor.  . If you are 74-4 years old, ask your  health care provider if you should take aspirin to prevent strokes.  . Use sunscreen. Apply sunscreen liberally and repeatedly throughout the day. You should seek shade when your shadow is shorter than you. Protect yourself by wearing Rodgers sleeves, pants, a wide-brimmed hat, and sunglasses year round, whenever you are outdoors.  . Once a month, do a whole body skin exam, using a mirror to look at the skin on your back. Tell your health care provider of new moles, moles that have irregular borders, moles that are larger than a pencil eraser, or moles that have changed in shape or color.

## 2018-03-16 ENCOUNTER — Encounter: Payer: Self-pay | Admitting: Family Medicine

## 2018-03-16 ENCOUNTER — Ambulatory Visit (INDEPENDENT_AMBULATORY_CARE_PROVIDER_SITE_OTHER): Payer: Medicare Other | Admitting: Family Medicine

## 2018-03-16 VITALS — BP 122/66 | HR 74 | Temp 97.8°F | Ht 64.0 in | Wt 163.0 lb

## 2018-03-16 DIAGNOSIS — E039 Hypothyroidism, unspecified: Secondary | ICD-10-CM

## 2018-03-16 DIAGNOSIS — Z7189 Other specified counseling: Secondary | ICD-10-CM

## 2018-03-16 DIAGNOSIS — I10 Essential (primary) hypertension: Secondary | ICD-10-CM | POA: Diagnosis not present

## 2018-03-16 DIAGNOSIS — Z1211 Encounter for screening for malignant neoplasm of colon: Secondary | ICD-10-CM | POA: Diagnosis not present

## 2018-03-16 DIAGNOSIS — E559 Vitamin D deficiency, unspecified: Secondary | ICD-10-CM | POA: Diagnosis not present

## 2018-03-16 DIAGNOSIS — M8580 Other specified disorders of bone density and structure, unspecified site: Secondary | ICD-10-CM | POA: Diagnosis not present

## 2018-03-16 MED ORDER — LOSARTAN POTASSIUM 100 MG PO TABS: 100.0000 mg | ORAL_TABLET | Freq: Every day | ORAL | 3 refills | Status: DC

## 2018-03-16 NOTE — Assessment & Plan Note (Signed)
Advanced directives: packet received 2 yrs ago, did not review. She still has this at home. Will review. Would want husband to be HCPOA.

## 2018-03-16 NOTE — Assessment & Plan Note (Signed)
Chronic, stable. Continue current regimen. 

## 2018-03-16 NOTE — Assessment & Plan Note (Signed)
Recently changed to generic levothyroxine due to cost. Discussed returning in 2 months for lab check TSH.

## 2018-03-16 NOTE — Patient Instructions (Addendum)
Pass by lab to pick up stool kit.  Return in 2 months for thyroid lab recheck.  If interested, check with pharmacy about new 2 shot shingles series (shingrix).  Llame a [facility] para hacer cita para mamograma [phone] Comenzar vitamina D 2000 unidades diarias de nuevo.  Sheela Stack copia de living will.   [place] (Health Maintenance, Female) Un estilo de vida saludable y los cuidados preventivos pueden favorecer considerablemente a la salud y Musician. Pregunte a su mdico cul es el cronograma de exmenes peridicos apropiado para usted. Esta es una buena oportunidad para consultarlo sobre cmo prevenir enfermedades y [place] sano. Adems de los controles, hay muchas otras cosas que puede hacer usted mismo. Los expertos han realizado numerosas investigaciones [employer] cambios en el estilo de vida y las medidas de prevencin que, [place], lo ayudarn a mantenerse sano. Solicite a su mdico ms informacin. EL PESO Y LA DIETA Consuma una dieta saludable.  Asegrese de [employer] verduras, frutas, productos lcteos de bajo contenido de [place] y Advertising account planner.  No consuma muchos alimentos de alto contenido de grasas slidas, azcares agregados o sal.  Realice actividad fsica con regularidad. Esta es una de las prcticas ms importantes que puede hacer por su salud. ? La Delorise Shiner de los adultos deben hacer ejercicio durante al menos 116mnutos por semana. El ejercicio debe aumentar la frecuencia cardaca y pActorla transpiracin (ejercicio de i[place]. ? La mayora de los adultos tambin deben hacer ejercicios de elongacin al mToysRusveces a la semana. Agregue esto al su plan de ejercicio de intensidad moderada. Mantenga un peso saludable.  El ndice de masa corporal ([facility] es una medida que puede utilizarse para identificar posibles problemas de pCooksville Proporciona una estimacin de la grasa corporal basndose en  el peso y la altura. Su mdico puede ayudarle a dRadiation protection practitionerIJacinto Cityy a lScientist, forensico mTheatre managerun peso saludable.  Para las mujeres de 20aos o ms: ? Un IConnecticut Childrens Medical Centermenor de 18,5 se considera bajo peso. ? Un IEliza Coffee Memorial Hospitalentre 18,5 y 24,9 es normal. ? Un IHale Ho'Ola Hamakuaentre 25 y 29,9 se considera sobrepeso. ? Un IMC de 30 o ms se considera obesidad. Observe los niveles de colesterol y lpidos en la sangre.  Debe comenzar a rEnglish as a second language teacherde lpidos y cResearch officer, trade unionen la sangre a los 20aos y luego repetirlos cada 559aos  Es posible que nAutomotive engineerlos niveles de colesterol con mayor frecuencia si: ? Sus niveles de lpidos y colesterol son altos. ? Es mayor de [id number] ? Presenta un alto riesgo de padecer enfermedades cardacas. DETECCIN DE CNCER Cncer de pulmn  Se recomienda realizar exmenes de deteccin de cncer de pulmn a personas adultas entre 553y 864aos que estn en riesgo de dHorticulturist, commercialde pulmn por sus antecedentes de consumo de tabaco.  Se recomienda una tomografa computarizada de baja dosis de los pulmones todos los aos a las personas que: ? Fuman actualmente. ? Hayan dejado el hbito en algn momento en los ltimos 15aos. ? Hayan fumado durante 30aos un paquete diario. Un paquete-ao equivale a fumar un promedio de un paquete de cigarrillos diario durante un ao.  Los exmenes de deteccin anuales deben continuar hasta que hayan pasado 15aos desde que dej de fumar.  Ya no debern realizarse si tiene un problema de salud que le impida recibir tratamiento para eScience writerde pulmn. Cncer de mama  Practique la autoconciencia de la mama. Esto significa reconocer  la apariencia normal de sus mamas y cmo las siente.  Tambin significa realizar autoexmenes regulares de [employer]. Informe a su mdico sobre cualquier cambio, sin importar cun pequeo sea.  Si tiene entre 20 y 71 aos, un mdico debe realizarle un examen clnico de las mamas como parte del examen regular de  [place], cada 1 a 3aos.  Si tiene 40aos o ms, debe Information systems manager clnico de las Microsoft. Tambin considere realizarse una [place]) todos los [place].  Si tiene antecedentes familiares de cncer de mama, hable con su mdico para someterse a un estudio gentico.  Si tiene alto riesgo de Chief Financial Officer de mama, hable con su mdico para someterse a Public house manager y [employer].  La evaluacin del gen del cncer de mama (BRCA) se recomienda a mujeres que tengan familiares con cnceres relacionados con el BRCA. Los cnceres relacionados con el BRCA incluyen los siguientes: ? [place]. ? Ovario. ? Trompas. ? Cnceres de peritoneo.  Los resultados de la evaluacin determinarn la necesidad de asesoramiento gentico y de [place] de BRCA1 y BRCA2. Cncer de cuello del tero El mdico puede recomendarle que se haga pruebas peridicas de deteccin de cncer de los rganos de la pelvis (ovarios, tero y vagina). Estas pruebas incluyen un examen plvico, que abarca controlar si se produjeron cambios microscpicos en la superficie del cuello del tero (prueba de Papanicolaou). Pueden recomendarle que se haga estas pruebas cada 3aos, a partir de los 21aos.  A las mujeres que tienen entre 30 y 9aos, los mdicos pueden recomendarles que se sometan a exmenes plvicos y pruebas de Papanicolaou cada 30aos, o a la prueba de Papanicolaou y el examen plvico en combinacin con estudios de deteccin del virus del papiloma humano (VPH) cada 5aos. Algunos tipos de VPH aumentan el riesgo de Chief Financial Officer de cuello del tero. La prueba para la deteccin del VPH tambin puede realizarse a mujeres de cualquier edad cuyos resultados de la prueba de Papanicolaou no sean claros.  Es posible que otros mdicos no recomienden exmenes de deteccin a mujeres no embarazadas que se consideran sujetos de bajo riesgo de Chief Financial Officer de pelvis y que no  tienen sntomas. Pregntele al mdico si un examen plvico de deteccin es adecuado para usted.  Si ha recibido un tratamiento para Science writer cervical o una enfermedad que podra causar cncer, necesitar realizarse una prueba de Papanicolaou y controles durante al menos 61 aos de concluido el [place]. Si no se ha hecho el Papanicolaou con regularidad, debern volver a evaluarse los factores de riesgo (como tener un nuevo compaero sexual), para Teacher, adult education si debe realizarse los estudios nuevamente. Algunas mujeres sufren problemas mdicos que aumentan la probabilidad de Museum/gallery curator cncer de cuello del tero. En estos casos, el mdico podr [employer] se realicen controles y pruebas de Papanicolaou con ms frecuencia. Cncer colorrectal  Este tipo de cncer puede detectarse y a menudo prevenirse.  Por lo general, los estudios de rutina se deben Medical laboratory scientific officer a Field seismologist a Proofreader de los 4 aos y Dixie 22 aos.  Sin embargo, el mdico podr aconsejarle que lo haga antes, si tiene factores de riesgo para el cncer de colon.  Tambin puede recomendarle que use un kit de prueba para Hydrologist en la materia fecal.  Es posible que se use una pequea cmara en el extremo de un tubo para examinar directamente el colon (sigmoidoscopia o colonoscopia) a fin de Engineer, mining  tempranas de Surveyor, minerals.  Los exmenes de rutina generalmente comienzan a los 50aos.  El examen directo del colon se debe repetir cada 5 a 10aos hasta los 75aos. Sin embargo, es posible que se realicen exmenes con mayor frecuencia, si se detectan formas tempranas de plipos precancerosos o pequeos bultos. Cncer de piel  Revise la piel de la cabeza a los pies con regularidad.  Informe a su mdico si aparecen nuevos lunares o los que tiene se modifican, especialmente en su forma y color.  Tambin notifique al mdico si tiene un lunar que es ms grande que el tamao de una goma de lpiz.  Siempre use  pantalla solar. Aplique pantalla solar de Kerry Dory y repetida a lo largo del Training and development officer.  Protjase usando mangas y [employer], un sombrero de ala ancha y gafas para el sol, siempre que se encuentre en el exterior. ENFERMEDADES CARDACAS, DIABETES E HIPERTENSIN ARTERIAL  La hipertensin arterial causa enfermedades cardacas y [place] el riesgo de ictus. La hipertensin arterial es ms probable en los siguientes casos: ? Las personas que tienen la presin arterial en el extremo del rango normal (100-139/85-89 mm Hg). ? [employer] con sobrepeso u obesidad. ? Scientist, water quality.  Si usted tiene entre 18 y 39 aos, debe medirse la presin arterial cada 3 a 5 aos. Si usted tiene 40 aos o ms, debe medirse la presin arterial [employer]. Debe medirse la presin arterial dos veces: una vez cuando est en un hospital o una clnica y la otra vez cuando est en otro sitio. Registre el promedio de [employer]. Para controlar su presin arterial cuando no est en un hospital o [place], puede usar lo siguiente: ? Jorje Guild automtica para medir la presin arterial en una farmacia. ? Un monitor para medir la presin arterial en el hogar.  Si tiene entre 3 y 79 aos, consulte a su mdico si debe tomar aspirina para prevenir el ictus.  Realcese exmenes de deteccin de la diabetes con regularidad. Esto incluye la toma de [place] de sangre para controlar el nivel de azcar en la sangre durante el [place]. ? Si tiene un peso normal y un bajo riesgo de padecer diabetes, realcese este anlisis cada tres aos despus de los 45aos. ? Si tiene sobrepeso y un alto riesgo de padecer diabetes, considere someterse a este anlisis antes o con mayor frecuencia. PREVENCIN DE INFECCIONES HepatitisB  Si tiene un riesgo ms alto de Museum/gallery curator hepatitis B, debe someterse a un examen de deteccin de este virus. Se considera que tiene un alto riesgo de contraer hepatitis B si: ? Naci en  un pas donde la hepatitis B es frecuente. Pregntele a su mdico qu pases son considerados de Public affairs consultant. ? Sus padres nacieron en un pas de alto riesgo y usted no recibi una vacuna que lo proteja contra la hepatitis B (vacuna contra la hepatitis B). ? [place]. ? [place] agujas para inyectarse drogas. ? Vive con alguien que tiene hepatitis B. ? Ha tenido sexo con alguien que tiene hepatitis B. ? Recibe tratamiento de hemodilisis. ? Toma ciertos medicamentos para el cncer, trasplante de rganos y afecciones autoinmunitarias. Hepatitis C  Se recomienda un anlisis de [place] para: ? [employer] 1945 y 1965. ? Todas las personas que tengan un riesgo de haber contrado hepatitis C. Enfermedades de transmisin sexual (ETS).  Debe realizarse pruebas de deteccin de enfermedades de transmisin sexual (ETS), incluidas gonorrea y clamidia  si: ? Es sexualmente [place] y es menor de 45YKD. ? Es mayor de 24aos, y Investment banker, operational informa que corre riesgo de tener este tipo de infecciones. ? La actividad sexual ha cambiado desde que le hicieron la ltima prueba de deteccin y tiene un riesgo mayor de Best boy clamidia o Radio broadcast assistant. Pregntele al mdico si usted tiene riesgo.  Si no tiene el VIH, pero corre riesgo de infectarse por el virus, se recomienda tomar diariamente un medicamento recetado para evitar la infeccin. Esto se conoce como profilaxis previa a la exposicin. Se considera que est en riesgo si: ? Es [place] sexualmente y no [place] preservativos habitualmente o no conoce el estado del VIH de sus Advertising copywriter. ? Se inyecta drogas. ? Es [place] sexualmente con Ardelia Mems pareja que tiene VIH. Consulte a su mdico para saber si tiene un alto riesgo de infectarse por el VIH. Si opta por comenzar la profilaxis previa a la exposicin, primero debe realizarse anlisis de deteccin del VIH. Luego, le harn anlisis cada 30mses mientras est tomando los medicamentos para la  profilaxis previa a la exposicin. [facility] Si es premenopusica y puede quedar eNew Lexington solicite a su mdico asesoramiento previo a la concepcin.  Si puede quedar embarazada, tome 400 a [id number](mcg) de cido fAnheuser-Busch  Si desea evitar el embarazo, hable con su mdico sobre el control de la natalidad (anticoncepcin). OSTEOPOROSIS Y MENOPAUSIA  La osteoporosis es una enfermedad en la que los huesos pierden los minerales y la fuerza por el avance de la edad. El resultado pueden ser fracturas graves en los h[place] El riesgo de osteoporosis puede identificarse con uArdelia Memsprueba de densidad sea.  Si tiene 65aos o ms, o si est en riesgo de sufrir osteoporosis y fracturas, pregunte a su mdico si debe someterse a exmenes.  Consulte a su mdico si debe tomar un suplemento de calcio o de vitamina D para reducir el riesgo de osteoporosis.  La menopausia puede presentar ciertos sntomas fsicos y rGaffer  La terapia de reemplazo hormonal puede reducir algunos de estos sntomas y rGaffer Consulte a su mdico para saber si la terapia de reemplazo hormonal es conveniente para usted. INSTRUCCIONES PARA EL CUIDADO EN EL HOGAR  Realcese los estudios de rutina de la salud, dentales y de lPublic librarian  MPaynesville  No consuma ningn producto que contenga tabaco, lo que incluye cigarrillos, tabaco de mHigher education careers advisero cPsychologist, sport and exercise  Si est embarazada, no beba alcohol.  Si est amamantando, reduzca el consumo de alcohol y la frecuencia con la que consume.  Si es mujer y no est embarazada limite el consumo de alcohol a no ms de 1 medida por da. Una medida equivale a 12onzas de cerveza, 5onzas de vino o 1onzas de bebidas alcohlicas de alta graduacin.  No consuma drogas.  No comparta agujas.  Solicite ayuda a su mdico si necesita apoyo o informacin para abandonar las drogas.  Informe a su mdico si a menudo se siente  deprimido.  Notifique a su mdico si alguna vez ha sido vctima de abuso o si no se siente seguro en su hogar. Esta informacin no tiene cMarine scientistel consejo del mdico. Asegrese de hacerle al mdico cualquier pregunta que tenga. Document Released: 12/03/2011 Document Revised: 01/04/2015 Document Reviewed: 09/17/2015 Elsevier Interactive Patient Education  2[employer]

## 2018-03-16 NOTE — Progress Notes (Signed)
BP 122/66 (BP Location: Left Arm, Patient Position: Sitting, Cuff Size: Normal)   Pulse 74   Temp 97.8 F (36.6 C) (Oral)   Ht 5\' 4"  (1.626 m)   Wt 163 lb (73.9 kg)   SpO2 97%   BMI 27.98 kg/m    CC: AMW f/u visit Subjective:    Patient ID: Beth Rodgers, female    DOB: 1935-04-15, 83 y.o.   MRN: [id number]  HPI: Beth Rodgers is a 83 y.o. female presenting on 03/16/2018 for Annual Exam (Pt 2.)   Saw Katha Cabal last week for medicare wellness visit. Note reviewed.   Hypothyroid - on brand synthroid long term - transitioned to generic due to due to cost.   Preventative: Declines colonoscopy, stool kits normal to date. Rpt today. Mammogram at [facility] 11/2015 - Birads1 Well woman with Beth Rodgers - has not recently seen. Denies pelvic pain, pressure, or vaginal bleeding or skin changes.  DEXA Date: 06/2009 T score Spine: -1.8, hip -2.0, 11/2015 persistent osteopenia T -2.3. Fosamax/evista has been on hold.  Flu shot - did not complete this year Td 04/2012  Pneumovax 10/2012. prevnar 2015.  zostavax 06/2014. shingrix - discussed Advanced directives: packet received 3 yrs ago, has not had chance to complete. She still has this at home. Will review. Would want husband to be HCPOA.  Seat belt use discussed Sunscreen use discussed. Denies changing moles. Sees derm regularly Non smoker  Alcohol - seldom  Caffeine: 2-3 cups/day  Lives with husband, majority of time alone as he travels to [place]  Has grandchildren nearby  Occ: Field seismologist, Metallurgist  Activity: bed exercises, limited walking  Diet: does get fruits and vegetables, only some water   Relevant past medical, surgical, family and social history reviewed and updated as indicated. Interim medical history since our last visit reviewed. Allergies and medications reviewed and updated. Outpatient Medications Prior to Visit  Medication Sig Dispense Refill  . Ascorbic Acid (VITAMIN C CR PO) Take by mouth as directed. Reported  on 12/17/2015    . augmented betamethasone dipropionate (DIPROLENE-AF) 0.05 % cream 2 (two) times daily as needed. Appy to active flares on body twice a day as needed  1  . CALCIUM CARBONATE PO Take 600 mg by mouth as directed. Reported on 12/17/2015    . Cholecalciferol (VITAMIN D) 2000 units CAPS Take 1 capsule (2,000 Units total) by mouth daily.    Marland Kitchen CICLOPIROX EX Apply topically as needed. Reported on 12/17/2015    . desonide (DESOWEN) 0.05 % cream APPLY TO AFFECTED AREA TWICE A DAY 30 g 1  . Multiple Vitamin (MULTIVITAMIN) tablet Take 1 tablet by mouth daily. Reported on 12/17/2015    . NITROSTAT 0.4 MG SL tablet Reported on 04/16/2016    . SYNTHROID 75 MCG tablet TAKE 1 TABLET BY MOUTH EVERY DAY 90 tablet 0  . Travoprost, BAK Free, (TRAVATAN) 0.004 % SOLN ophthalmic solution Place 1 drop into both eyes at bedtime.    Marland Kitchen losartan (COZAAR) 100 MG tablet TAKE 1 TABLET BY MOUTH EVERY DAY 90 tablet 0   No facility-administered medications prior to visit.      Per HPI unless specifically indicated in ROS section below Review of Systems     Objective:    BP 122/66 (BP Location: Left Arm, Patient Position: Sitting, Cuff Size: Normal)   Pulse 74   Temp 97.8 F (36.6 C) (Oral)   Ht 5\' 4"  (1.626 m)   Wt 163 lb (73.9 kg)  SpO2 97%   BMI 27.98 kg/m   Wt Readings from Last 3 Encounters:  03/16/18 163 lb (73.9 kg)  03/09/18 162 lb 8 oz (73.7 kg)  06/25/17 166 lb 8 oz (75.5 kg)    Physical Exam  Constitutional: She is oriented to person, place, and time. She appears well-developed and well-nourished. No distress.  HENT:  Head: Normocephalic and atraumatic.  Right Ear: Hearing, tympanic membrane, external ear and ear canal normal.  Left Ear: Hearing, tympanic membrane, external ear and ear canal normal.  Nose: Nose normal.  Mouth/Throat: Uvula is midline, oropharynx is clear and moist and mucous membranes are normal. No oropharyngeal exudate, posterior oropharyngeal edema or posterior  oropharyngeal erythema.  Eyes: Conjunctivae and EOM are normal. Pupils are equal, round, and reactive to light. No scleral icterus.  Neck: Normal range of motion. Neck supple. Carotid bruit is not present. No thyromegaly present.  Cardiovascular: Normal rate, regular rhythm, normal heart sounds and intact distal pulses.  No murmur heard. Pulses:      Radial pulses are 2+ on the right side, and 2+ on the left side.  Pulmonary/Chest: Effort normal and breath sounds normal. No respiratory distress. She has no wheezes. She has no rales.  Abdominal: Soft. Bowel sounds are normal. She exhibits no distension and no mass. There is no tenderness. There is no rebound and no guarding.  Musculoskeletal: Normal range of motion. She exhibits no edema.  Lymphadenopathy:    She has no cervical adenopathy.  Neurological: She is alert and oriented to person, place, and time.  CN grossly intact, station and gait intact  Skin: Skin is warm and dry. No rash noted.  Psychiatric: She has a normal mood and affect. Her behavior is normal. Judgment and thought content normal.  Nursing note and vitals reviewed.  Results for orders placed or performed in visit on 03/09/18  VITAMIN D 25 Hydroxy (Vit-D Deficiency, Fractures)  Result Value Ref Range   VITD 20.16 (L) 30.00 - 100.00 ng/mL  TSH  Result Value Ref Range   TSH 4.04 0.35 - 4.50 uIU/mL  Basic metabolic panel  Result Value Ref Range   Sodium 140 135 - 145 mEq/L   Potassium 4.1 3.5 - 5.1 mEq/L   Chloride 106 96 - 112 mEq/L   CO2 28 19 - 32 mEq/L   Glucose, Bld 83 70 - 99 mg/dL   BUN 13 6 - 23 mg/dL   Creatinine, Ser 0.62 0.40 - 1.20 mg/dL   Calcium 9.1 8.4 - 10.5 mg/dL   GFR 98.24 >60.00 mL/min      Assessment & Plan:   Problem List Items Addressed This Visit    Advanced care planning/counseling discussion - Primary    Advanced directives: packet received 2 yrs ago, did not review. She still has this at home. Will review. Would want husband to be  HCPOA.       Essential hypertension    Chronic, stable. Continue current regimen.       Relevant Medications   losartan (COZAAR) 100 MG tablet   Hypothyroidism    Recently changed to generic levothyroxine due to cost. Discussed returning in 2 months for lab check TSH.       Osteopenia    Encouraged regular weight bearing exercise as well as regular calcium/vit D daily.       Vitamin D deficiency    She has been off vit D replacement. Advised restart 2000 IU daily.        Other Visit  Diagnoses    Special screening for malignant neoplasms, colon       Relevant Orders   Fecal occult blood, imunochemical       Meds ordered this encounter  Medications  . losartan (COZAAR) 100 MG tablet    Sig: Take 1 tablet (100 mg total) by mouth daily.    Dispense:  90 tablet    Refill:  3   Orders Placed This Encounter  Procedures  . Fecal occult blood, imunochemical    Standing Status:   Future    Standing Expiration Date:   03/17/2019    Follow up plan: Return in about 1 year (around 03/17/2019), or if symptoms worsen or fail to improve, for annual exam, prior fasting for blood work, medicare wellness visit.  Ria Bush, MD

## 2018-03-16 NOTE — Assessment & Plan Note (Signed)
She has been off vit D replacement. Advised restart 2000 IU daily.

## 2018-03-16 NOTE — Assessment & Plan Note (Signed)
Encouraged regular weight bearing exercise as well as regular calcium/vit D daily.

## 2018-03-28 ENCOUNTER — Ambulatory Visit (INDEPENDENT_AMBULATORY_CARE_PROVIDER_SITE_OTHER): Payer: Medicare Other | Admitting: Vascular Surgery

## 2018-03-28 ENCOUNTER — Encounter (INDEPENDENT_AMBULATORY_CARE_PROVIDER_SITE_OTHER): Payer: Self-pay | Admitting: Vascular Surgery

## 2018-03-28 VITALS — BP 136/78 | HR 73 | Resp 17 | Ht 65.0 in | Wt 164.0 lb

## 2018-03-28 DIAGNOSIS — I10 Essential (primary) hypertension: Secondary | ICD-10-CM

## 2018-03-28 DIAGNOSIS — M79606 Pain in leg, unspecified: Secondary | ICD-10-CM | POA: Diagnosis not present

## 2018-03-28 DIAGNOSIS — M7989 Other specified soft tissue disorders: Secondary | ICD-10-CM

## 2018-03-28 DIAGNOSIS — I83813 Varicose veins of bilateral lower extremities with pain: Secondary | ICD-10-CM

## 2018-03-28 DIAGNOSIS — I872 Venous insufficiency (chronic) (peripheral): Secondary | ICD-10-CM

## 2018-03-30 ENCOUNTER — Encounter (INDEPENDENT_AMBULATORY_CARE_PROVIDER_SITE_OTHER): Payer: Self-pay | Admitting: Vascular Surgery

## 2018-03-30 DIAGNOSIS — I890 Lymphedema, not elsewhere classified: Secondary | ICD-10-CM | POA: Insufficient documentation

## 2018-03-30 DIAGNOSIS — I83813 Varicose veins of bilateral lower extremities with pain: Secondary | ICD-10-CM | POA: Insufficient documentation

## 2018-03-30 NOTE — Progress Notes (Signed)
MRN : [id number]  Beth Rodgers is a 83 y.o. (02-14-1935) female who presents with chief complaint of  Chief Complaint  Patient presents with  . Follow-up    Lower exteremity pain  .  History of Present Illness: The patient is seen for evaluation of symptomatic varicose veins. The patient relates burning and stinging which worsened steadily throughout the course of the day, particularly with standing. The patient also notes an aching and throbbing pain over the varicosities, particularly with prolonged dependent positions. The symptoms are significantly improved with elevation.  The patient also notes that during hot weather the symptoms are greatly intensified. The patient states the pain from the varicose veins interferes with work, daily exercise, shopping and household maintenance. At this point, the symptoms are persistent and severe enough that they're having a negative impact on lifestyle and are interfering with daily activities.  There is no history of DVT, PE or superficial thrombophlebitis. There is no history of ulceration or hemorrhage. The patient denies a significant family history of varicose veins.  The patient has worn graduated compression in the past but has not found it very helpful. At the present time the patient has not been using over-the-counter analgesics. There is a history of prior surgical intervention and sclerotherapy.  She is status post stripping of the saphenous veins proximally 40 years ago and then there was a repeat operation approximately 10 years ago.  She is also undergone multiple episodes of sclerotherapy in including saline and foam the most recent of which was foam scleral 2 years ago   Current Meds  Medication Sig  . losartan (COZAAR) 100 MG tablet Take 1 tablet (100 mg total) by mouth daily.  . Multiple Vitamin (MULTIVITAMIN) tablet Take 1 tablet by mouth daily. Reported on 12/17/2015  . SYNTHROID 75 MCG tablet TAKE 1 TABLET BY MOUTH EVERY DAY    . Travoprost, BAK Free, (TRAVATAN) 0.004 % SOLN ophthalmic solution Place 1 drop into both eyes at bedtime.    Past Medical History:  Diagnosis Date  . Chronic venous insufficiency    with varicose veins  . Glaucoma   . History  of basal cell carcinoma    left nare  . History of chicken pox   . Hypothyroid   . Osteoarthritis    back pain, L knee pain  . Osteopenia 06/2009   DEXA 11/2015: T -2.3 hip, -2.2 spine, on longterm bisphosphonate/evista  . Pyogenic granuloma 04/16/2016    Past Surgical History:  Procedure Laterality Date  . BREAST BIOPSY     core  . CATARACT EXTRACTION     bilateral  . DEXA  06/2009   T score Spine: -1.8, hip -2.0  . Foam sclerotherapy Bilateral 09/2015   [place], [place]  . NASAL RECONSTRUCTION  2005   for BCC L nare s/p Mohs  . nuclear stress test  2014   normal in [place]  . RADIOFREQUENCY ABLATION Left 01/2012   remnant L G saphenous vein below knee  . RADIOFREQUENCY ABLATION Right 02/2013   RLE vein ablation   . VEIN LIGATION AND STRIPPING  remote   bilateral G saphenous veins    Social History Social History   Tobacco Use  . Smoking status: Never Smoker  . Smokeless tobacco: Never Used  Substance Use Topics  . Alcohol use: Yes    Comment: Occasionally drinks wine  . Drug use: No    Family History Family History  Problem Relation Age of Onset  . Cancer  Mother        thyroid cancer  . Heart disease Father        CHF  . Diabetes Father   . Glaucoma Father   . Arthritis Father   . Coronary artery disease Maternal Uncle   . Bipolar disorder Son   . Stroke Neg Hx   . Breast cancer Neg Hx   No family history of bleeding/clotting disorders, porphyria or autoimmune disease   No Known Allergies   REVIEW OF SYSTEMS (Negative unless checked)  Constitutional: [] Weight loss  [] Fever  [] Chills Cardiac: [] Chest pain   [] Chest pressure   [] Palpitations   [] Shortness of breath when laying flat   [] Shortness of breath with  exertion. Vascular:  [] Pain in legs with walking   [x] Pain in legs with standing[] History of DVT   [] Phlebitis   [x] Swelling in legs   [x] Varicose veins   [] Non-healing ulcers Pulmonary:   [] Uses home oxygen   [] Productive cough   [] Hemoptysis   [] Wheeze  [] COPD   [] Asthma Neurologic:  [] Dizziness   [] Seizures   [] History of stroke   [] History of TIA  [] Aphasia   [] Vissual changes   [] Weakness or numbness in arm   [] Weakness or numbness in leg Musculoskeletal:   [] Joint swelling   [] Joint pain   [] Low back pain Hematologic:  [] Easy bruising  [] Easy bleeding   [] Hypercoagulable state   [] Anemic Gastrointestinal:  [] Diarrhea   [] Vomiting  [] Gastroesophageal reflux/heartburn   [] Difficulty swallowing. Genitourinary:  [] Chronic kidney disease   [] Difficult urination  [] Frequent urination   [] Blood in urine Skin:  [x]  Venous rashes   [] Ulcers  Psychological:  [] History of anxiety   []  History of major depression.  Physical Examination  Vitals:   03/28/18 1115  BP: 136/78  Pulse: 73  Resp: 17  Weight: 164 lb (74.4 kg)  Height: 5\' 5"  (1.651 m)   Body mass index is 27.29 kg/m. Gen: WD/WN, NAD Head: [place]/AT, No temporalis wasting.  Ear/Nose/Throat: Hearing grossly intact, nares w/o erythema or drainage, poor dentition Eyes: PER, EOMI, sclera nonicteric.  Neck: Supple, no masses.  No bruit or JVD.  Pulmonary:  Good air movement, clear to auscultation bilaterally, no use of accessory muscles.  Cardiac: RRR, normal S1, S2, no Murmurs. Vascular: Large varicosities present extensively greater than 10 mm bilaterally.  Mild venous stasis changes to the legs bilaterally.  2+ soft pitting edema Vessel Right Left  Radial Palpable Palpable  PT Palpable Palpable  DP Palpable Palpable   Gastrointestinal: soft, non-distended. No guarding/no peritoneal signs.  Musculoskeletal: M/S 5/5 throughout.  No deformity or atrophy.  Neurologic: CN 2-12 intact. Pain and light touch intact in extremities.   Symmetrical.  Speech is fluent. Motor exam as listed above. Psychiatric: Judgment intact, Mood & affect appropriate for pt's clinical situation. Dermatologic: Venous rashes no ulcers noted.  No changes consistent with cellulitis. Lymph : No Cervical lymphadenopathy, no lichenification or skin changes of chronic lymphedema.  CBC Lab Results  Component Value Date   WBC 5.9 06/25/2017   HGB 13.4 06/25/2017   HCT 39.9 06/25/2017   MCV 87.5 06/25/2017   PLT 215.0 06/25/2017    BMET    Component Value Date/Time   NA 140 03/09/2018 1047   K 4.1 03/09/2018 1047   CL 106 03/09/2018 1047   CO2 28 03/09/2018 1047   GLUCOSE 83 03/09/2018 1047   BUN 13 03/09/2018 1047   CREATININE 0.62 03/09/2018 1047   CALCIUM 9.1 03/09/2018 1047   CrCl cannot  be calculated (Patient's most recent lab result is older than the maximum 21 days allowed.).  COAG No results found for: INR, PROTIME  Radiology No results found.    Assessment/Plan 1. Varicose veins of bilateral lower extremities with pain  Recommend:  The patient has large symptomatic varicose veins that are painful and associated with swelling.  I have had a long discussion with the patient regarding  varicose veins and why they cause symptoms.  Patient will begin wearing graduated compression stockings class 1 on a daily basis, beginning first thing in the morning and removing them in the evening. The patient is instructed specifically not to sleep in the stockings.    The patient  will also begin using over-the-counter analgesics such as Motrin 600 mg po TID to help control the symptoms.    In addition, behavioral modification including elevation during the day will be initiated.    Pending the results of these changes the  patient will be reevaluated in three months.   An  ultrasound of the venous system will be obtained.   Further plans will be based on the ultrasound results and whether conservative therapies are successful at  eliminating the pain and swelling.  - VAS [place] LOWER EXTREMITY VENOUS REFLUX; Future  2. Chronic venous insufficiency No surgery or intervention at this point in time.    I have had a long discussion with the patient regarding venous insufficiency and why it  causes symptoms. I have discussed with the patient the chronic skin changes that accompany venous insufficiency and the long term sequela such as infection and ulceration.  Patient will begin wearing graduated compression stockings class 1 (20-30 mmHg) or compression wraps on a daily basis a prescription was given. The patient will put the stockings on first thing in the morning and removing them in the evening. The patient is instructed specifically not to sleep in the stockings.    In addition, behavioral modification including several periods of elevation of the lower extremities during the day will be continued. I have demonstrated that proper elevation is a position with the ankles at heart level.  The patient is instructed to begin routine exercise, especially walking on a daily basis  Patient should undergo duplex ultrasound of the venous system to ensure that DVT or reflux is not present.  Following the review of the ultrasound the patient will follow up in 2-3 months to reassess the degree of swelling and the control that graduated compression stockings or compression wraps  is offering.   The patient can be assessed for a Lymph Pump at that time  3. Pain and swelling of lower extremity, unspecified laterality See 1&2  4. Essential hypertension Continue antihypertensive medications as already ordered, these medications have been reviewed and there are no changes at this time.     Hortencia Pilar, MD  03/30/2018 3:51 PM

## 2018-03-31 ENCOUNTER — Encounter (INDEPENDENT_AMBULATORY_CARE_PROVIDER_SITE_OTHER): Payer: Medicare Other

## 2018-03-31 ENCOUNTER — Ambulatory Visit (INDEPENDENT_AMBULATORY_CARE_PROVIDER_SITE_OTHER): Payer: Medicare Other | Admitting: Vascular Surgery

## 2018-03-31 ENCOUNTER — Other Ambulatory Visit (INDEPENDENT_AMBULATORY_CARE_PROVIDER_SITE_OTHER): Payer: Medicare Other

## 2018-03-31 DIAGNOSIS — L400 Psoriasis vulgaris: Secondary | ICD-10-CM | POA: Diagnosis not present

## 2018-03-31 DIAGNOSIS — L738 Other specified follicular disorders: Secondary | ICD-10-CM | POA: Diagnosis not present

## 2018-03-31 DIAGNOSIS — I8311 Varicose veins of right lower extremity with inflammation: Secondary | ICD-10-CM | POA: Diagnosis not present

## 2018-03-31 DIAGNOSIS — D225 Melanocytic nevi of trunk: Secondary | ICD-10-CM | POA: Diagnosis not present

## 2018-03-31 DIAGNOSIS — Z1211 Encounter for screening for malignant neoplasm of colon: Secondary | ICD-10-CM

## 2018-03-31 DIAGNOSIS — L814 Other melanin hyperpigmentation: Secondary | ICD-10-CM | POA: Diagnosis not present

## 2018-03-31 DIAGNOSIS — L570 Actinic keratosis: Secondary | ICD-10-CM | POA: Diagnosis not present

## 2018-03-31 DIAGNOSIS — I872 Venous insufficiency (chronic) (peripheral): Secondary | ICD-10-CM | POA: Diagnosis not present

## 2018-03-31 DIAGNOSIS — I8312 Varicose veins of left lower extremity with inflammation: Secondary | ICD-10-CM | POA: Diagnosis not present

## 2018-03-31 DIAGNOSIS — D1801 Hemangioma of skin and subcutaneous tissue: Secondary | ICD-10-CM | POA: Diagnosis not present

## 2018-03-31 DIAGNOSIS — Z85828 Personal history of other malignant neoplasm of skin: Secondary | ICD-10-CM | POA: Diagnosis not present

## 2018-03-31 LAB — FECAL OCCULT BLOOD, IMMUNOCHEMICAL: FECAL OCCULT BLD: NEGATIVE

## 2018-03-31 LAB — FECAL OCCULT BLOOD, GUAIAC: FECAL OCCULT BLD: NEGATIVE

## 2018-04-01 ENCOUNTER — Encounter: Payer: Self-pay | Admitting: Family Medicine

## 2018-04-14 ENCOUNTER — Encounter (INDEPENDENT_AMBULATORY_CARE_PROVIDER_SITE_OTHER): Payer: Self-pay | Admitting: Vascular Surgery

## 2018-04-14 ENCOUNTER — Ambulatory Visit (INDEPENDENT_AMBULATORY_CARE_PROVIDER_SITE_OTHER): Payer: Medicare Other

## 2018-04-14 ENCOUNTER — Ambulatory Visit (INDEPENDENT_AMBULATORY_CARE_PROVIDER_SITE_OTHER): Payer: Medicare Other | Admitting: Vascular Surgery

## 2018-04-14 ENCOUNTER — Encounter (INDEPENDENT_AMBULATORY_CARE_PROVIDER_SITE_OTHER): Payer: Medicare Other

## 2018-04-14 VITALS — BP 159/84 | HR 68 | Resp 16 | Ht 65.0 in | Wt 165.6 lb

## 2018-04-14 DIAGNOSIS — I83813 Varicose veins of bilateral lower extremities with pain: Secondary | ICD-10-CM | POA: Diagnosis not present

## 2018-04-14 DIAGNOSIS — I872 Venous insufficiency (chronic) (peripheral): Secondary | ICD-10-CM

## 2018-04-14 DIAGNOSIS — I10 Essential (primary) hypertension: Secondary | ICD-10-CM | POA: Diagnosis not present

## 2018-04-17 ENCOUNTER — Encounter (INDEPENDENT_AMBULATORY_CARE_PROVIDER_SITE_OTHER): Payer: Self-pay | Admitting: Vascular Surgery

## 2018-04-17 NOTE — Progress Notes (Signed)
MRN : [id number]  Beth Rodgers is a 83 y.o. (July 13, 1935) female who presents with chief complaint of  Chief Complaint  Patient presents with  . Follow-up    asap bil ven reflux  .  History of Present Illness: The patient returns to the office for followup status post laser ablation of the bilateral great saphenous veins remotely. The patient notes multiple residual varicosities bilaterally which continued to hurt with dependent positions and remained tender to palpation. The patient's swelling is unchanged from preoperative status. The patient continues to wear graduated compression stockings on a daily basis but these are not eliminating the pain and discomfort. The patient continues to use over-the-counter anti-inflammatory medications to treat the pain and related symptoms but this has not given the patient relief. The patient notes the pain in the lower extremities is causing problems with daily exercise, problems at work and even with household activities such as preparing meals and doing dishes.  The patient is otherwise done well and there have been no complications related to the laser procedure or interval changes in the patient's overall   Venous ultrasound today shows successful laser ablation of the bilateral great saphenous veins, no DVT identified.    Current Meds  Medication Sig  . Ascorbic Acid (VITAMIN C CR PO) Take by mouth as directed. Reported on 12/17/2015  . augmented betamethasone dipropionate (DIPROLENE-AF) 0.05 % cream 2 (two) times daily as needed. Appy to active flares on body twice a day as needed  . CALCIUM CARBONATE PO Take 600 mg by mouth as directed. Reported on 12/17/2015  . Cholecalciferol (VITAMIN D) 2000 units CAPS Take 1 capsule (2,000 Units total) by mouth daily.  Marland Kitchen CICLOPIROX EX Apply topically as needed. Reported on 12/17/2015  . halobetasol (ULTRAVATE) 0.05 % ointment APPLY TO AFFECTED AREA TWICE A DAY  . losartan (COZAAR) 100 MG tablet Take 1  tablet (100 mg total) by mouth daily.  . Multiple Vitamin (MULTIVITAMIN) tablet Take 1 tablet by mouth daily. Reported on 12/17/2015  . NITROSTAT 0.4 MG SL tablet Reported on 04/16/2016  . SYNTHROID 75 MCG tablet TAKE 1 TABLET BY MOUTH EVERY DAY  . Travoprost, BAK Free, (TRAVATAN) 0.004 % SOLN ophthalmic solution Place 1 drop into both eyes at bedtime.    Past Medical History:  Diagnosis Date  . Chronic venous insufficiency    with varicose veins  . Glaucoma   . History  of basal cell carcinoma    left nare  . History of chicken pox   . Hypothyroid   . Osteoarthritis    back pain, L knee pain  . Osteopenia 06/2009   DEXA 11/2015: T -2.3 hip, -2.2 spine, on longterm bisphosphonate/evista  . Pyogenic granuloma 04/16/2016    Past Surgical History:  Procedure Laterality Date  . BREAST BIOPSY     core  . CATARACT EXTRACTION     bilateral  . DEXA  06/2009   T score Spine: -1.8, hip -2.0  . Foam sclerotherapy Bilateral 09/2015   [facility], [place]  . NASAL RECONSTRUCTION  2005   for BCC L nare s/p Mohs  . nuclear stress test  2014   normal in [place]  . RADIOFREQUENCY ABLATION Left 01/2012   remnant L G saphenous vein below knee  . RADIOFREQUENCY ABLATION Right 02/2013   RLE vein ablation   . VEIN LIGATION AND STRIPPING  remote   bilateral G saphenous veins    Social History Social History   Tobacco Use  .  Smoking status: Never Smoker  . Smokeless tobacco: Never Used  Substance Use Topics  . Alcohol use: Yes    Comment: Occasionally drinks wine  . Drug use: No    Family History Family History  Problem Relation Age of Onset  . Cancer Mother        thyroid cancer  . Heart disease Father        CHF  . Diabetes Father   . Glaucoma Father   . Arthritis Father   . Coronary artery disease Maternal Uncle   . Bipolar disorder Son   . Stroke Neg Hx   . Breast cancer Neg Hx     No Known Allergies   REVIEW OF SYSTEMS (Negative unless  checked)  Constitutional: [] Weight loss  [] Fever  [] Chills Cardiac: [] Chest pain   [] Chest pressure   [] Palpitations   [] Shortness of breath when laying flat   [] Shortness of breath with exertion. Vascular:  [] Pain in legs with walking   [x] Pain in legs with dependency [] History of DVT   [] Phlebitis   [x] Swelling in legs   [x] Varicose veins   [] Non-healing ulcers Pulmonary:   [] Uses home oxygen   [] Productive cough   [] Hemoptysis   [] Wheeze  [] COPD   [] Asthma Neurologic:  [] Dizziness   [] Seizures   [] History of stroke   [] History of TIA  [] Aphasia   [] Vissual changes   [] Weakness or numbness in arm   [] Weakness or numbness in leg Musculoskeletal:   [] Joint swelling   [x] Joint pain   [x] Low back pain Hematologic:  [] Easy bruising  [] Easy bleeding   [] Hypercoagulable state   [] Anemic Gastrointestinal:  [] Diarrhea   [] Vomiting  [] Gastroesophageal reflux/heartburn   [] Difficulty swallowing. Genitourinary:  [] Chronic kidney disease   [] Difficult urination  [] Frequent urination   [] Blood in urine Skin:  [x] Rashes   [] Ulcers  Psychological:  [] History of anxiety   []  History of major depression.  Physical Examination  Vitals:   04/14/18 1406  BP: (!) 159/84  Pulse: 68  Resp: 16  Weight: 165 lb 9.6 oz (75.1 kg)  Height: 5\' 5"  (1.651 m)   Body mass index is 27.56 kg/m. Gen: WD/WN, NAD Head: [place]/AT, No temporalis wasting.  Ear/Nose/Throat: Hearing grossly intact, nares w/o erythema or drainage Eyes: PER, EOMI, sclera nonicteric.  Neck: Supple, no large masses.   Pulmonary:  Good air movement, no audible wheezing bilaterally, no use of accessory muscles.  Cardiac: RRR, no JVD Vascular:  scattered varicosities present bilaterally.  Mild venous stasis changes to the legs bilaterally.  2+ soft pitting edema Vessel Right Left  Radial Palpable Palpable  PT Palpable Palpable  DP Palpable Palpable  Gastrointestinal: Non-distended. No guarding/no peritoneal signs.  Musculoskeletal: M/S 5/5  throughout.  No deformity or atrophy.  Neurologic: CN 2-12 intact. Symmetrical.  Speech is fluent. Motor exam as listed above. Psychiatric: Judgment intact, Mood & affect appropriate for pt's clinical situation. Dermatologic: Venous rashes no ulcers noted.  No changes consistent with cellulitis. Lymph : No lichenification or skin changes of chronic lymphedema.  CBC Lab Results  Component Value Date   WBC 5.9 06/25/2017   HGB 13.4 06/25/2017   HCT 39.9 06/25/2017   MCV 87.5 06/25/2017   PLT 215.0 06/25/2017    BMET    Component Value Date/Time   NA 140 03/09/2018 1047   K 4.1 03/09/2018 1047   CL 106 03/09/2018 1047   CO2 28 03/09/2018 1047   GLUCOSE 83 03/09/2018 1047   BUN 13 03/09/2018 1047  CREATININE 0.62 03/09/2018 1047   CALCIUM 9.1 03/09/2018 1047   CrCl cannot be calculated (Patient's most recent lab result is older than the maximum 21 days allowed.).  COAG No results found for: INR, PROTIME  Radiology No results found.    Assessment/Plan 1. Chronic venous insufficiency Recommend:  No surgery or intervention at this point in time.    I have reviewed my previous discussion with the patient regarding swelling and why it causes symptoms.  Patient will continue wearing graduated compression stockings class 1 (20-30 mmHg) on a daily basis. The patient will  beginning wearing the stockings first thing in the morning and removing them in the evening. The patient is instructed specifically not to sleep in the stockings.    In addition, behavioral modification including several periods of elevation of the lower extremities during the day will be continued.  This was reviewed with the patient during the initial visit.  The patient will also continue routine exercise, especially walking on a daily basis as was discussed during the initial visit.    Despite conservative treatments including graduated compression therapy class 1 and behavioral modification including  exercise and elevation the patient  has not obtained adequate control of the lymphedema.  The patient still has stage 3 lymphedema and therefore, I believe that a lymph pump should be added to improve the control of the patient's lymphedema.  Additionally, a lymph pump is warranted because it will reduce the risk of cellulitis and ulceration in the future.  Patient should follow-up in six months    2. Varicose veins of bilateral lower extremities with pain Recommend:  The patient has had successful ablation of the previously incompetent saphenous venous system but still has persistent symptoms of pain and swelling that are having a negative impact on daily life and daily activities.  Patient should undergo injection sclerotherapy to treat the residual varicosities.  The risks, benefits and alternative therapies were reviewed in detail with the patient.  All questions were answered.  The patient agrees to proceed with sclerotherapy at their convenience.  The patient will continue wearing the graduated compression stockings and using the over-the-counter pain medications to treat her symptoms.     3. Essential hypertension Continue antihypertensive medications as already ordered, these medications have been reviewed and there are no changes at this time.     Hortencia Pilar, MD  04/17/2018 11:46 AM

## 2018-04-25 ENCOUNTER — Other Ambulatory Visit: Payer: Self-pay | Admitting: Family Medicine

## 2018-04-27 DIAGNOSIS — H401131 Primary open-angle glaucoma, bilateral, mild stage: Secondary | ICD-10-CM | POA: Diagnosis not present

## 2018-07-06 ENCOUNTER — Ambulatory Visit: Payer: Self-pay | Admitting: Family Medicine

## 2018-07-06 ENCOUNTER — Other Ambulatory Visit: Payer: Self-pay

## 2018-07-21 ENCOUNTER — Other Ambulatory Visit: Payer: Self-pay | Admitting: Family Medicine

## 2018-07-26 ENCOUNTER — Other Ambulatory Visit: Payer: Self-pay

## 2018-08-01 ENCOUNTER — Emergency Department: Payer: Medicare Other

## 2018-08-01 ENCOUNTER — Emergency Department
Admission: EM | Admit: 2018-08-01 | Discharge: 2018-08-01 | Disposition: A | Discharge status: Home / Self Care | Payer: Medicare Other | Attending: Emergency Medicine | Admitting: Emergency Medicine

## 2018-08-01 ENCOUNTER — Other Ambulatory Visit: Payer: Self-pay

## 2018-08-01 ENCOUNTER — Ambulatory Visit: Payer: Self-pay | Admitting: Family Medicine

## 2018-08-01 ENCOUNTER — Encounter: Payer: Self-pay | Admitting: Emergency Medicine

## 2018-08-01 DIAGNOSIS — M7989 Other specified soft tissue disorders: Secondary | ICD-10-CM | POA: Diagnosis not present

## 2018-08-01 DIAGNOSIS — Z85828 Personal history of other malignant neoplasm of skin: Secondary | ICD-10-CM | POA: Diagnosis not present

## 2018-08-01 DIAGNOSIS — I10 Essential (primary) hypertension: Secondary | ICD-10-CM | POA: Diagnosis not present

## 2018-08-01 DIAGNOSIS — L03116 Cellulitis of left lower limb: Secondary | ICD-10-CM | POA: Insufficient documentation

## 2018-08-01 DIAGNOSIS — E039 Hypothyroidism, unspecified: Secondary | ICD-10-CM | POA: Insufficient documentation

## 2018-08-01 DIAGNOSIS — M79662 Pain in left lower leg: Secondary | ICD-10-CM | POA: Diagnosis present

## 2018-08-01 MED ORDER — CEPHALEXIN 500 MG PO CAPS: 500.0000 mg | ORAL_CAPSULE | Freq: Four times a day (QID) | ORAL | 0 refills | Status: DC

## 2018-08-01 NOTE — Telephone Encounter (Signed)
Patient is calling to report that she has a hard ,painful, red area in her left calf. She has recently traveled to [place] and she was going to start using compression stocking- but with this area present she was concerned about and infection in her leg.patient also reports she has a fall at the hospital where her grandson has had brain surgery and she has been traveling to help take care of him. Patient also states she has some increased swelling in her foot as well. No appointment available at PCP- patient directed to ED  Reason for Disposition . [1] Red area or streak AND [2] fever  Answer Assessment - Initial Assessment Questions 1. ONSET: "When did the pain start?"      Pain started 2 days ago- patient can not remember 2. LOCATION: "Where is the pain located?"      Left leg- half inch above the ankle and up the calf- exterior 3. PAIN: "How bad is the pain?"    (Scale 1-10; or mild, moderate, severe)   -  MILD (1-3): doesn't interfere with normal activities    -  MODERATE (4-7): interferes with normal activities (e.g., work or school) or awakens from sleep, limping    -  SEVERE (8-10): excruciating pain, unable to do any normal activities, unable to walk     3-4-without touching it   7- touching 4. WORK OR EXERCISE: "Has there been any recent work or exercise that involved this part of the body?"      Patient did fall at hospital- while taking care for grandson- brain surgery patient at [facility] 5. CAUSE: "What do you think is causing the leg pain?"     Cellulitis vs clot 6. OTHER SYMPTOMS: "Do you have any other symptoms?" (e.g., chest pain, back pain, breathing difficulty, swelling, rash, fever, numbness, weakness)     no 7. PREGNANCY: "Is there any chance you are pregnant?" "When was your last menstrual period?"     n/a  Protocols used: LEG PAIN-A-AH

## 2018-08-01 NOTE — Telephone Encounter (Signed)
Noted. plz call tomorrow for an update.  

## 2018-08-01 NOTE — ED Notes (Signed)
Pt ambulatory to bathroom without difficulty.  

## 2018-08-01 NOTE — ED Triage Notes (Addendum)
Patient reports that she has pain in left lower leg for approximately a week. Patient has history of circulation issues, however states this pain is worse and different that normal. Pain worse with palpation of area. Patient with redness and discoloration noted to bilateral legs, however worse on left leg. Swelling also noted to lower leg and foot. Patient reports that she fell last week and some of the discoloration is from bruising from fall. Patient denies fever. States "I'm concerned about cellulitis". Good cap refill and pedal pulse noted. Patient also reports plane ride from [place] last Monday.

## 2018-08-01 NOTE — ED Provider Notes (Signed)
[facility] Emergency Department Provider Note       Time seen: ----------------------------------------- 5:48 PM on 08/01/2018 -----------------------------------------   I have reviewed the triage vital signs and the nursing notes.  HISTORY   Chief Complaint Leg Pain    HPI Beth Rodgers is a 83 y.o. female with a history of hypothyroidism, osteoarthritis, chronic venous insufficiency who presents to the ED for left leg redness and tenderness concerning for cellulitis.  Patient states she looked up the symptoms online and was told she may have cellulitis.  She reports she has a history of circulation issues, noticed some swelling to her left lower leg and was concerned about same.  Recently she had a fall with some bruising related to falling.  She denies fevers, chills or other complaints.  Past Medical History:  Diagnosis Date  . Chronic venous insufficiency    with varicose veins  . Glaucoma   . History  of basal cell carcinoma    left nare  . History of chicken pox   . Hypothyroid   . Osteoarthritis    back pain, L knee pain  . Osteopenia 06/2009   DEXA 11/2015: T -2.3 hip, -2.2 spine, on longterm bisphosphonate/evista  . Pyogenic granuloma 04/16/2016    Patient Active Problem List   Diagnosis Date Noted  . Varicose veins of bilateral lower extremities with pain 03/30/2018  . Pain and swelling of lower extremity 03/30/2018  . Dizziness 02/02/2017  . Situational depression 12/18/2016  . Health maintenance examination 10/23/2016  . Vitamin D deficiency 10/11/2016  . Fatigue 08/12/2016  . Essential hypertension 12/17/2015  . Dermatitis of external ear 12/17/2015  . Corn of foot 05/16/2015  . Advanced care planning/counseling discussion 10/30/2014  . Knee pain 08/17/2013  . Oropharyngeal dysphagia 11/15/2012  . Difficulty with family 03/30/2012  . Medicare annual wellness visit, subsequent 03/30/2012  . History of basal cell carcinoma   .  Osteoarthritis   . Osteopenia   . Hypothyroidism   . Glaucoma   . Chronic venous insufficiency     Past Surgical History:  Procedure Laterality Date  . BREAST BIOPSY     core  . CATARACT EXTRACTION     bilateral  . DEXA  06/2009   T score Spine: -1.8, hip -2.0  . Foam sclerotherapy Bilateral 09/2015   [place], [place]  . NASAL RECONSTRUCTION  2005   for BCC L nare s/p Mohs  . nuclear stress test  2014   normal in [place]  . RADIOFREQUENCY ABLATION Left 01/2012   remnant L G saphenous vein below knee  . RADIOFREQUENCY ABLATION Right 02/2013   RLE vein ablation   . VEIN LIGATION AND STRIPPING  remote   bilateral G saphenous veins    Allergies Patient has no known allergies.  Social History Social History   Tobacco Use  . Smoking status: Never Smoker  . Smokeless tobacco: Never Used  Substance Use Topics  . Alcohol use: Yes    Comment: Occasionally drinks wine  . Drug use: No   Review of Systems Constitutional: Negative for fever. Cardiovascular: Negative for chest pain. Respiratory: Negative for shortness of breath. Gastrointestinal: Negative for abdominal pain, vomiting and diarrhea. Musculoskeletal: Positive for left leg pain and mild erythema Skin: Positive for left leg erythema Neurological: Negative for headaches, focal weakness or numbness.  All systems negative/normal/unremarkable except as stated in the HPI  ____________________________________________   PHYSICAL EXAM:  VITAL SIGNS: ED Triage Vitals  Enc Vitals Group  BP 08/01/18 1525 (!) 152/59     Pulse Rate 08/01/18 1525 71     Resp --      Temp 08/01/18 1525 98.6 F (37 C)     Temp Source 08/01/18 1525 Oral     SpO2 08/01/18 1525 97 %     Weight 08/01/18 1526 160 lb (72.6 kg)     Height 08/01/18 1526 5\' 6"  (1.676 m)     Head Circumference --      Peak Flow --      Pain Score 08/01/18 1535 3     Pain Loc --      Pain Edu? --      Excl. in [place]? --    Constitutional: Alert  and oriented. Well appearing and in no distress. Eyes: Conjunctivae are normal. Normal extraocular movements. Cardiovascular: Normal rate, regular rhythm. No murmurs, rubs, or gallops.  Normal dorsalis pedis pulses in the feet Respiratory: Normal respiratory effort without tachypnea nor retractions. Breath sounds are clear and equal bilaterally. No wheezes/rales/rhonchi. Gastrointestinal: Soft and nontender. Normal bowel sounds Musculoskeletal: Mild tenderness to left lower leg laterally, minimal edema. Neurologic:  Normal speech and language. No gross focal neurologic deficits are appreciated.  Skin: Mild erythema noted to the lateral aspect of the left lower leg, mild tenderness is noted Psychiatric: Mood and affect are normal. Speech and behavior are normal.  ____________________________________________  ED COURSE:  As part of my medical decision making, I reviewed the following data within the [place] History obtained from family if available, nursing notes, old chart and ekg, as well as notes from prior ED visits. Patient presented for possible cellulitis, we will assess with  imaging as indicated at this time.   Procedures ____________________________________________   RADIOLOGY  Left lower extremity ultrasound is negative for DVT  ____________________________________________  DIFFERENTIAL DIAGNOSIS   Cellulitis, DVT, venous stasis dermatitis  FINAL ASSESSMENT AND PLAN  Cellulitis   Plan: The patient had presented for concerns for left lower extremity cellulitis who likely does have a mild case of this. Patient's imaging was negative for DVT, she will be started on low-dose Keflex and is cleared for outpatient follow-up with her doctor.   Laurence Aly, MD   Note: This note was generated in part or whole with voice recognition software. Voice recognition is usually quite accurate but there are transcription errors that can and very often do  occur. I apologize for any typographical errors that were not detected and corrected.     Earleen Newport, MD 08/01/18 [phone]

## 2018-08-02 NOTE — Telephone Encounter (Signed)
Spoke with pt asking how she is doing.  States she was seen at ER yesterday. Told no blood clots but cellulitis.  Given 10-day course of abx.  Scheduled hosp f/u on 08/12/18 at 9:30 AM.  Says has to care for her grandson since having brain surgery so she prefers not to schedule any appts before then.

## 2018-08-04 ENCOUNTER — Telehealth: Payer: Self-pay

## 2018-08-04 NOTE — Telephone Encounter (Signed)
Pt. Called back and given Dr. Danise Mina' recommendations. Verbalizes understanding.

## 2018-08-04 NOTE — Telephone Encounter (Signed)
Patient calling back to check on getting something for pain. Advised waiting on Dr. Darnell Level to advise.

## 2018-08-04 NOTE — Telephone Encounter (Signed)
Would suggest she take OTC ibuprofen 200mg  1-2 tablets with meals twice a day for just a few days to help pain. Kidneys would tolerate this. For swelling, recommend leg elevation, avoiding salt and drinking plenty of water. Don't recommend stronger medication at this time.

## 2018-08-04 NOTE — Telephone Encounter (Signed)
Pt had ED visit 08/01/18 and has HFU on 08/12/18.Please advise.

## 2018-08-04 NOTE — Telephone Encounter (Signed)
Copied from [place] [id number]. Topic: General - Other >> Aug 04, 2018  8:43 AM Keene Breath wrote: Reason for CRM: Patient called to request the doctor or nurse to call her back because she is having swelling in her legs due to her cellulitis.  She has an appointment scheduled for 08/12/18, but would like something to relieve the swelling and pain until her visit.  Please advise.  CB# [phone].

## 2018-08-10 ENCOUNTER — Ambulatory Visit: Payer: Self-pay

## 2018-08-10 ENCOUNTER — Telehealth: Payer: Self-pay | Admitting: Family Medicine

## 2018-08-10 ENCOUNTER — Ambulatory Visit (INDEPENDENT_AMBULATORY_CARE_PROVIDER_SITE_OTHER): Payer: Medicare Other | Admitting: Family Medicine

## 2018-08-10 ENCOUNTER — Encounter: Payer: Self-pay | Admitting: Family Medicine

## 2018-08-10 VITALS — BP 130/74 | HR 70 | Temp 98.5°F | Ht 65.0 in | Wt 161.0 lb

## 2018-08-10 DIAGNOSIS — L03116 Cellulitis of left lower limb: Secondary | ICD-10-CM

## 2018-08-10 DIAGNOSIS — I83813 Varicose veins of bilateral lower extremities with pain: Secondary | ICD-10-CM

## 2018-08-10 DIAGNOSIS — L299 Pruritus, unspecified: Secondary | ICD-10-CM | POA: Insufficient documentation

## 2018-08-10 DIAGNOSIS — L0390 Cellulitis, unspecified: Secondary | ICD-10-CM | POA: Insufficient documentation

## 2018-08-10 HISTORY — DX: Cellulitis, unspecified: L03.90

## 2018-08-10 MED ORDER — AZITHROMYCIN 250 MG PO TABS: ORAL_TABLET | ORAL | 0 refills | Status: DC

## 2018-08-10 NOTE — Assessment & Plan Note (Signed)
Treating L cellulitis  Enc elevation and supp hose

## 2018-08-10 NOTE — Telephone Encounter (Signed)
Patient said she is without air at her home for the next ten days and is getting an awful heat rash all over body. She wants to know what she can put on this?      Reason for Disposition . Female  Answer Assessment - Initial Assessment Questions 1.) CALLER DIAGNOSIS: "What do you think is causing the rash?" (e.g., Chickenpox, Hives, Impetigo, Athlete's Foot, etc.) Patient thinks she is having reaction to heat- she is without air conditioning and she is breaking out in rash 2.) LOCATION:  "Is it widespread or localized?" rash is under breast and in the middle of chest- torso, shoulder blades, under belly 3.) NEW MEDICATIONS: "Are you taking any new medicine?" Patient just took her last dose of antibiotic- 10 day course  Answer Assessment - Initial Assessment Questions 1. APPEARANCE of RASH: "Describe the rash."      Rash is located in creases under breast, fold of abdomen and torso- patient thinks that it is related to heat because her air conditioning has been out for several days and she has been sweating. 2. LOCATION: "Where is the rash located?"      See above 3. ONSET: "When did the rash start?"      2 days 4. ITCHING: "Does the rash itch?" If so, ask: "How bad is the itch?"  (Scale 1-10; or mild, moderate, severe)     Itching- moderate 5. PAIN: "Does the rash hurt?" If so, ask: "How bad is the pain?"  (Scale 1-10; or mild, moderate, severe)     Not asked 6. OTHER SYMPTOMS: "Do you have any other symptoms?" (e.g., fever)     no 7. PREGNANCY: "Is there any chance you are pregnant?" "When was your last menstrual period?"     n/a  Protocols used: [place], [place]  Patient reports she does not think that the cellulitis is completely gone. She still have some redness below the knee area.Treated area is still sensitive. Skype to office- appointment for today to have leg checked to see if patient may need more antibiotics.

## 2018-08-10 NOTE — Assessment & Plan Note (Signed)
Per hx and review of records- clinically improved but not resolved  Reviewed hospital records, lab results and studies in detail   Hesitant to extend keflex -given hx of itching  Px azithromycin 5 d  Leg elevation  supp hose Update if not starting to improve in a week or if worsening   F/u with pcp on frida

## 2018-08-10 NOTE — Progress Notes (Signed)
Subjective:    Patient ID: Beth Rodgers, female    DOB: 03/28/1935, 83 y.o.   MRN: [id number]  HPI  83 yo pt of Dr Darnell Level here for check of leg cellulitis  She has appt with him on Friday for hosp f/u   She has known hx of varicose veins/venous insufficiency   Seen on 8/5 in ED for L leg redness and tenderness  LLE [place] was neg for DVT diag with cellulitis and venous stasis dermatitis   Started on low dose keflex  She thinks it is "a little better" -but not entirely  It is sore  She noticed 4-5 d later a bit of swelling under knee (not red or warm however)  Usually wears support hose -good about that   She called regarding a heat rash also today  Used hydrocortisone 1% for itch-helped  Mostly back and abdomen /waist [employer] conditioning is out at home -for another 10 days  Heat makes her itch more   Patient Active Problem List   Diagnosis Date Noted  . Cellulitis 08/10/2018  . Itching 08/10/2018  . Varicose veins of bilateral lower extremities with pain 03/30/2018  . Pain and swelling of lower extremity 03/30/2018  . Dizziness 02/02/2017  . Situational depression 12/18/2016  . Health maintenance examination 10/23/2016  . Vitamin D deficiency 10/11/2016  . Fatigue 08/12/2016  . Essential hypertension 12/17/2015  . Dermatitis of external ear 12/17/2015  . Corn of foot 05/16/2015  . Advanced care planning/counseling discussion 10/30/2014  . Knee pain 08/17/2013  . Oropharyngeal dysphagia 11/15/2012  . Difficulty with family 03/30/2012  . Medicare annual wellness visit, subsequent 03/30/2012  . History of basal cell carcinoma   . Osteoarthritis   . Osteopenia   . Hypothyroidism   . Glaucoma   . Chronic venous insufficiency    Past Medical History:  Diagnosis Date  . Chronic venous insufficiency    with varicose veins  . Glaucoma   . History  of basal cell carcinoma    left nare  . History of chicken pox   . Hypothyroid   . Osteoarthritis    back pain, L knee  pain  . Osteopenia 06/2009   DEXA 11/2015: T -2.3 hip, -2.2 spine, on longterm bisphosphonate/evista  . Pyogenic granuloma 04/16/2016   Past Surgical History:  Procedure Laterality Date  . BREAST BIOPSY     core  . CATARACT EXTRACTION     bilateral  . DEXA  06/2009   T score Spine: -1.8, hip -2.0  . Foam sclerotherapy Bilateral 09/2015   [place], [place]  . NASAL RECONSTRUCTION  2005   for BCC L nare s/p Mohs  . nuclear stress test  2014   normal in [place]  . RADIOFREQUENCY ABLATION Left 01/2012   remnant L G saphenous vein below knee  . RADIOFREQUENCY ABLATION Right 02/2013   RLE vein ablation   . VEIN LIGATION AND STRIPPING  remote   bilateral G saphenous veins   Social History   Tobacco Use  . Smoking status: Never Smoker  . Smokeless tobacco: Never Used  Substance Use Topics  . Alcohol use: Yes    Comment: Occasionally drinks wine  . Drug use: No   Family History  Problem Relation Age of Onset  . Cancer Mother        thyroid cancer  . Heart disease Father        CHF  . Diabetes Father   . Glaucoma Father   .  Arthritis Father   . Coronary artery disease Maternal Uncle   . Bipolar disorder Son   . Stroke Neg Hx   . Breast cancer Neg Hx    No Known Allergies Current Outpatient Medications on File Prior to Visit  Medication Sig Dispense Refill  . Ascorbic Acid (VITAMIN C CR PO) Take by mouth as directed. Reported on 12/17/2015    . augmented betamethasone dipropionate (DIPROLENE-AF) 0.05 % cream 2 (two) times daily as needed. Appy to active flares on body twice a day as needed  1  . CALCIUM CARBONATE PO Take 600 mg by mouth as directed. Reported on 12/17/2015    . Cholecalciferol (VITAMIN D) 2000 units CAPS Take 1 capsule (2,000 Units total) by mouth daily.    Marland Kitchen CICLOPIROX EX Apply topically as needed. Reported on 12/17/2015    . desonide (DESOWEN) 0.05 % cream APPLY TO AFFECTED AREA TWICE A DAY 30 g 1  . halobetasol (ULTRAVATE) 0.05 % ointment APPLY TO  AFFECTED AREA TWICE A DAY  1  . levothyroxine (SYNTHROID, LEVOTHROID) 75 MCG tablet TAKE 1 TABLET BY MOUTH EVERY DAY 90 tablet 1  . losartan (COZAAR) 100 MG tablet Take 1 tablet (100 mg total) by mouth daily. 90 tablet 3  . Multiple Vitamin (MULTIVITAMIN) tablet Take 1 tablet by mouth daily. Reported on 12/17/2015    . NITROSTAT 0.4 MG SL tablet Reported on 04/16/2016    . Travoprost, BAK Free, (TRAVATAN) 0.004 % SOLN ophthalmic solution Place 1 drop into both eyes at bedtime.     No current facility-administered medications on file prior to visit.      Review of Systems  Constitutional: Negative for activity change, appetite change, fatigue, fever and unexpected weight change.  HENT: Negative for congestion, ear pain, rhinorrhea, sinus pressure and sore throat.   Eyes: Negative for pain, redness and visual disturbance.  Respiratory: Negative for cough, shortness of breath and wheezing.   Cardiovascular: Negative for chest pain and palpitations.       Varicosities   Gastrointestinal: Negative for abdominal pain, blood in stool, constipation and diarrhea.  Endocrine: Negative for polydipsia and polyuria.  Genitourinary: Negative for dysuria, frequency and urgency.  Musculoskeletal: Negative for arthralgias, back pain, joint swelling and myalgias.  Skin: Positive for color change and rash. Negative for pallor.  Allergic/Immunologic: Negative for environmental allergies.  Neurological: Negative for dizziness, syncope and headaches.  Hematological: Negative for adenopathy. Does not bruise/bleed easily.  Psychiatric/Behavioral: Negative for decreased concentration and dysphoric mood. The patient is not nervous/anxious.        Objective:   Physical Exam  Constitutional: She appears well-developed and well-nourished. No distress.  Well appearing   HENT:  Head: Normocephalic and atraumatic.  Eyes: Pupils are equal, round, and reactive to light. Conjunctivae and EOM are normal. No scleral  icterus.  Neck: Normal range of motion. Neck supple.  Cardiovascular: Normal rate, regular rhythm and normal heart sounds.  LE varicosities   Pulmonary/Chest: Effort normal and breath sounds normal. No stridor. No respiratory distress. She has no wheezes.  Musculoskeletal: She exhibits edema. She exhibits no deformity.  Mild non pitting pedal edema with venous insuff  Lymphadenopathy:    She has no cervical adenopathy.  Neurological: She displays normal reflexes. Coordination normal.  Skin: Skin is warm and dry. No rash noted. There is erythema.  Hyperpigmentation of bilat LEs due to venous insufficiency  Varicosities noted Scant erythema of lateral L LE , also anterior under knee No skin interruption  Evidence of itching (excoriations) on back/abd/trunk with no rash  No urticaria  Psychiatric: She has a normal mood and affect.  Pleasant           Assessment & Plan:   Problem List Items Addressed This Visit      Cardiovascular and Mediastinum   Varicose veins of bilateral lower extremities with pain    Treating L cellulitis  Enc elevation and supp hose         Other   Cellulitis - Primary    Per hx and review of records- clinically improved but not resolved  Reviewed hospital records, lab results and studies in detail   Hesitant to extend keflex -given hx of itching  Px azithromycin 5 d  Leg elevation  supp hose Update if not starting to improve in a week or if worsening   F/u with pcp on frida       Itching

## 2018-08-10 NOTE — Telephone Encounter (Signed)
Was unable to reach pt for update on cellulitis.

## 2018-08-10 NOTE — Telephone Encounter (Signed)
I will see her today as planned and also check out rash

## 2018-08-10 NOTE — Telephone Encounter (Signed)
Copied from [place] [id number]. Topic: Quick Communication - See Telephone Encounter >> Aug 10, 2018  8:42 AM Vernona Rieger wrote: CRM for notification. See Telephone encounter for: 08/10/18.  Patient states she finished her cephALEXin (KEFLEX) 500 MG capsule ( for cellulitis )and is not scheduled to come in to see Dr Darnell Level for a hospital f/u until 8/16. She wants to know will she be okay to be without that until she is seen? Please advise.

## 2018-08-10 NOTE — Patient Instructions (Signed)
Try an antihistamine over the counter for itching  Zyrtec 10 mg is a good one - 1 pill daily  Also stay cool   I will send azithromycin (different antibiotic) to continue treating the cellulitis   Cool compresses may help   Take care of yourself

## 2018-08-10 NOTE — Telephone Encounter (Signed)
As FYI pt was triaged and has appt to see Dr Glori Bickers today just to check leg and cellulitis and pt will keep 08/12/18 appt with Dr Darnell Level for HFU.

## 2018-08-12 ENCOUNTER — Encounter: Payer: Self-pay | Admitting: Family Medicine

## 2018-08-12 ENCOUNTER — Ambulatory Visit (INDEPENDENT_AMBULATORY_CARE_PROVIDER_SITE_OTHER): Payer: Medicare Other | Admitting: Family Medicine

## 2018-08-12 VITALS — BP 120/84 | HR 72 | Temp 97.9°F | Ht 65.0 in | Wt 161.0 lb

## 2018-08-12 DIAGNOSIS — L03116 Cellulitis of left lower limb: Secondary | ICD-10-CM

## 2018-08-12 NOTE — Patient Instructions (Addendum)
Gusto verla hoy! Sigue recuperando bien.  Considere probiotico o yogur despues de terminar antibiotico.

## 2018-08-12 NOTE — Assessment & Plan Note (Signed)
Continues improving well. Now on azithromycin course after initial keflex course. rec finish this.  Discussed yogurt or probiotic use after recent antibiotics.  Update if recurrent or worsening symptoms instead of daily improvement

## 2018-08-12 NOTE — Progress Notes (Signed)
BP 120/84 (BP Location: Left Arm, Patient Position: Sitting, Cuff Size: Normal)   Pulse 72   Temp 97.9 F (36.6 C) (Oral)   Ht 5\' 5"  (1.651 m)   Wt 161 lb (73 kg)   SpO2 97%   BMI 26.79 kg/m    CC: LLE cellulitis ER f/u visit Subjective:    Patient ID: Beth Rodgers, female    DOB: 02/02/1935, 83 y.o.   MRN: [id number]  HPI: Beth Rodgers is a 83 y.o. female presenting on 08/12/2018 for Hospitalization Follow-up (Seen at [facility] ED on 08/01/18, primary dx Cellulitis of left LE.)   Nephew currently in rehab after brain tumor surgery [facility]) with persistent motor deficits - she had to shorten her trip to [place].   Recent ER visit for LLE cellulitis and venous stasis dermatitis - [place] negative for DVT, note reviewed. She had been having lfft leg pain and swelling for several months.  Treated with keflex 500mg  QID 7d course.   Saw Dr Glori Bickers earlier in the week with heat rash and itch (AC unit is out at home) as well as persistent LLE pain/swelling despite completing keflex course. Treating with OTC hydrocortisone 1% cream. Dr Glori Bickers also added 5d azithromycin for cellulitis. rec OTC antihistamine as well.   Relevant past medical, surgical, family and social history reviewed and updated as indicated. Interim medical history since our last visit reviewed. Allergies and medications reviewed and updated. Outpatient Medications Prior to Visit  Medication Sig Dispense Refill  . Ascorbic Acid (VITAMIN C CR PO) Take by mouth as directed. Reported on 12/17/2015    . augmented betamethasone dipropionate (DIPROLENE-AF) 0.05 % cream 2 (two) times daily as needed. Appy to active flares on body twice a day as needed  1  . azithromycin (ZITHROMAX Z-PAK) 250 MG tablet Take 2 pills by mouth today and then 1 pill daily for 4 days 6 tablet 0  . CALCIUM CARBONATE PO Take 600 mg by mouth as directed. Reported on 12/17/2015    . Cholecalciferol (VITAMIN D) 2000 units CAPS Take 1 capsule (2,000 Units total) by mouth  daily.    Marland Kitchen CICLOPIROX EX Apply topically as needed. Reported on 12/17/2015    . desonide (DESOWEN) 0.05 % cream APPLY TO AFFECTED AREA TWICE A DAY 30 g 1  . halobetasol (ULTRAVATE) 0.05 % ointment APPLY TO AFFECTED AREA TWICE A DAY  1  . levothyroxine (SYNTHROID, LEVOTHROID) 75 MCG tablet TAKE 1 TABLET BY MOUTH EVERY DAY 90 tablet 1  . losartan (COZAAR) 100 MG tablet Take 1 tablet (100 mg total) by mouth daily. 90 tablet 3  . Multiple Vitamin (MULTIVITAMIN) tablet Take 1 tablet by mouth daily. Reported on 12/17/2015    . NITROSTAT 0.4 MG SL tablet Reported on 04/16/2016    . Travoprost, BAK Free, (TRAVATAN) 0.004 % SOLN ophthalmic solution Place 1 drop into both eyes at bedtime.     No facility-administered medications prior to visit.      Per HPI unless specifically indicated in ROS section below Review of Systems     Objective:    BP 120/84 (BP Location: Left Arm, Patient Position: Sitting, Cuff Size: Normal)   Pulse 72   Temp 97.9 F (36.6 C) (Oral)   Ht 5\' 5"  (1.651 m)   Wt 161 lb (73 kg)   SpO2 97%   BMI 26.79 kg/m   Wt Readings from Last 3 Encounters:  08/12/18 161 lb (73 kg)  08/10/18 161 lb (73 kg)  08/01/18  160 lb (72.6 kg)    Physical Exam  Constitutional: She appears well-developed and well-nourished. No distress.  Musculoskeletal: She exhibits edema.  2+ DP bilaterally R calf circ 36.5cm L calf circ 37cm Chronic venous changes BLE  Tender to palpation bilateral lower legs  Resolving erythema L lateral lower leg  Skin: Skin is warm and dry.  Psychiatric: She has a normal mood and affect.  Nursing note and vitals reviewed.     Assessment & Plan:   Problem List Items Addressed This Visit    Cellulitis - Primary    Continues improving well. Now on azithromycin course after initial keflex course. rec finish this.  Discussed yogurt or probiotic use after recent antibiotics.  Update if recurrent or worsening symptoms instead of daily improvement           No orders of the defined types were placed in this encounter.  No orders of the defined types were placed in this encounter.   Follow up plan: No follow-ups on file.  Ria Bush, MD

## 2018-08-18 ENCOUNTER — Ambulatory Visit (INDEPENDENT_AMBULATORY_CARE_PROVIDER_SITE_OTHER): Payer: Medicare Other | Admitting: Family Medicine

## 2018-08-18 ENCOUNTER — Ambulatory Visit: Payer: Self-pay

## 2018-08-18 ENCOUNTER — Encounter: Payer: Self-pay | Admitting: Family Medicine

## 2018-08-18 DIAGNOSIS — M79605 Pain in left leg: Secondary | ICD-10-CM

## 2018-08-18 DIAGNOSIS — M7989 Other specified soft tissue disorders: Secondary | ICD-10-CM | POA: Diagnosis not present

## 2018-08-18 MED ORDER — DICLOFENAC SODIUM 1 % TD GEL: 1.0000 "application " | Freq: Three times a day (TID) | TRANSDERMAL | 0 refills | Status: DC

## 2018-08-18 MED ORDER — CEPHALEXIN 500 MG PO CAPS: 500.0000 mg | ORAL_CAPSULE | Freq: Three times a day (TID) | ORAL | 0 refills | Status: DC

## 2018-08-18 MED ORDER — CELECOXIB 100 MG PO CAPS
100.0000 mg | ORAL_CAPSULE | Freq: Two times a day (BID) | ORAL | 0 refills | Status: DC
Start: 2018-08-18 — End: 2019-05-29

## 2018-08-18 NOTE — Telephone Encounter (Signed)
Pt has appt with Dr Darnell Level 08/18/18 at 12:15.

## 2018-08-18 NOTE — Assessment & Plan Note (Signed)
She has pain and swelling of her left lower leg just below knee right at pes anserine bursa - possible bursitis. She also has some warmth at this area with minimal redness. Will treat as pes anserine bursitis with celebrex (has previously tolerated well) and voltaren gel. Given warmth, will cover for recurrent cellulitis with restarting keflex course (TID).  F/u 5 d in office. Pt agrees with plan.

## 2018-08-18 NOTE — Telephone Encounter (Signed)
Called pt. Back to discuss symptoms.  Reported she finished Azithromycin 2 days ago for cellulitis of left LE, proximal to ankle.  Now reported a new area of swelling, pink tissue, and discomfort, just below the knee, from medial to lateral side. Stated the area involved is approx. size of diameter of an apple.  Reported the swelling and redness of the ankle region has resolved.  Denied pain with weight bearing.  Denied fever/ chills. Denied any breakdown in skin. Stated the symptoms below the knee started about 3-4 days ago.  Appt. Scheduled for 12:15 PM today with PCP.  Agreed with plan.          Reason for Disposition . [1] Finished taking antibiotic AND [2] cellulitis symptoms are BETTER  (e.g., redness, pain  drainage, swelling) BUT [3] not completely gone  Answer Assessment - Initial Assessment Questions 1. SYMPTOM: "What's the main symptom you're concerned about?" (e.g., redness, swelling, pain, fever, weakness)     Swelling and pain on medial across to lateral aspect of left lower leg, just below left knee; approx. Size of diam. of apple 2. CELLULITIS LOCATION: "Where is the cellulitis  located?" (e.g., hand, arm, foot, leg, face)     See above 3. CELLULITIS  SIZE: "What is the size of the red area?" (e.g., inches, centimeters; compare to size of a coin) .     Pink area about size of diam. of apple 4. BETTER-SAME-WORSE: "Are you getting better, staying the same, or getting worse compared to the day you started the antibiotics?"  The original area in left ankle has improved/ subsided; now new area just below the left knee 5.  PAIN: Do you have any pain?"  If so, "How bad is the pain?"  (e.g., Scale 1-10; mild, moderate, or severe)    - MILD (1-3): doesn't interfere with normal activities     - MODERATE (4-7): interferes with normal activities or awakens from sleep    - SEVERE (8-10): excruciating pain, unable to do any normal activities       mild 6.  FEVER: "Do you have a fever?" If so,  ask: "What is it, how was it measured and when did it start?"     Denied  7. OTHER SYMPTOMS: "Do you have any other symptoms?" (e.g., pus coming from a wound, red streaks, weakness) 8.  DIAGNOSIS DATE: "When was the cellulitis diagnosed?" "By whom?" 08/01/18 in the ER 9.  ANTIBIOTIC NAME: "What antibiotic(s) are you taking?"  "How many times per day?" (Be sure the patient is receiving the antibiotic as directed).      Azithromycin; finished 2 days ago 10. ANTIBIOTIC DATE: "When was the antibiotic started?"       08/12/18-08/16/18 11. FOLLOW-UP APPOINTMENT: "Do you have follow-up appointment with your doctor?"      Saw PCP 08/12/18  Protocols used: CELLULITIS ON ANTIBIOTIC FOLLOW-UP CALL-A-AH

## 2018-08-18 NOTE — Patient Instructions (Addendum)
Creo que tiene bursitis de la rodilla pero quiero tambien cubrir posible infeccion  Toma keflex antibiotico y celebrex. Tambien mandare crema topical volatren.  dejeme saber el lunes como sigue.

## 2018-08-18 NOTE — Progress Notes (Signed)
BP 122/80 (BP Location: Left Arm, Patient Position: Sitting, Cuff Size: Normal)   Pulse 69   Temp 98.1 F (36.7 C) (Oral)   Ht 5\' 5"  (1.651 m)   Wt 162 lb (73.5 kg)   SpO2 96%   BMI 26.96 kg/m    CC: LLE pain Subjective:    Patient ID: Beth Rodgers, female    DOB: 05/30/1935, 83 y.o.   MRN: [id number]  HPI: Beth Rodgers is a 83 y.o. female presenting on 08/18/2018 for Leg Pain (C/o left lower LE pain and swelling. Noticed 4-5 days ago.)   See prior note for details. Seen here last week for LLE cellulitis f/u visit. Treated initially with keflex course then azithromycin course. Known venous stasis dermatitis.   Now over 5 days noticing increasing pain and swelling and redness lower leg just below knee. Started medially, now spreading laterally. She has been using arnica cream. No calf pain.   She is using compression stockings  Relevant past medical, surgical, family and social history reviewed and updated as indicated. Interim medical history since our last visit reviewed. Allergies and medications reviewed and updated. Outpatient Medications Prior to Visit  Medication Sig Dispense Refill  . Ascorbic Acid (VITAMIN C CR PO) Take by mouth as directed. Reported on 12/17/2015    . augmented betamethasone dipropionate (DIPROLENE-AF) 0.05 % cream 2 (two) times daily as needed. Appy to active flares on body twice a day as needed  1  . CALCIUM CARBONATE PO Take 600 mg by mouth as directed. Reported on 12/17/2015    . Cholecalciferol (VITAMIN D) 2000 units CAPS Take 1 capsule (2,000 Units total) by mouth daily.    Marland Kitchen CICLOPIROX EX Apply topically as needed. Reported on 12/17/2015    . desonide (DESOWEN) 0.05 % cream APPLY TO AFFECTED AREA TWICE A DAY 30 g 1  . halobetasol (ULTRAVATE) 0.05 % ointment APPLY TO AFFECTED AREA TWICE A DAY  1  . levothyroxine (SYNTHROID, LEVOTHROID) 75 MCG tablet TAKE 1 TABLET BY MOUTH EVERY DAY 90 tablet 1  . losartan (COZAAR) 100 MG tablet Take 1 tablet  (100 mg total) by mouth daily. 90 tablet 3  . Multiple Vitamin (MULTIVITAMIN) tablet Take 1 tablet by mouth daily. Reported on 12/17/2015    . NITROSTAT 0.4 MG SL tablet Reported on 04/16/2016    . Travoprost, BAK Free, (TRAVATAN) 0.004 % SOLN ophthalmic solution Place 1 drop into both eyes at bedtime.    Marland Kitchen azithromycin (ZITHROMAX Z-PAK) 250 MG tablet Take 2 pills by mouth today and then 1 pill daily for 4 days 6 tablet 0   No facility-administered medications prior to visit.      Per HPI unless specifically indicated in ROS section below Review of Systems     Objective:    BP 122/80 (BP Location: Left Arm, Patient Position: Sitting, Cuff Size: Normal)   Pulse 69   Temp 98.1 F (36.7 C) (Oral)   Ht 5\' 5"  (1.651 m)   Wt 162 lb (73.5 kg)   SpO2 96%   BMI 26.96 kg/m   Wt Readings from Last 3 Encounters:  08/18/18 162 lb (73.5 kg)  08/12/18 161 lb (73 kg)  08/10/18 161 lb (73 kg)    Physical Exam  Constitutional: She appears well-developed and well-nourished. No distress.  Musculoskeletal: She exhibits edema.  Left leg: tender swelling at left leg inferior to knee - with point tenderness to palpation at pes anserine bursa. Some discomfort along joint line but  FROM flexion/extension of knee. No palpable cords.  Right leg: mild discomfort to palpation at R pes anserine bursa  Skin: Skin is warm and dry.  Minimal erythema mild warmth at upper anterior leg below knee Chronic changes from CVI (hemosiderin deposition medial lower legs)  Nursing note and vitals reviewed.  Lab Results  Component Value Date   CREATININE 0.62 03/09/2018   BUN 13 03/09/2018   NA 140 03/09/2018   K 4.1 03/09/2018   CL 106 03/09/2018   CO2 28 03/09/2018      Assessment & Plan:   Problem List Items Addressed This Visit    Pain and swelling of lower extremity    She has pain and swelling of her left lower leg just below knee right at pes anserine bursa - possible bursitis. She also has some warmth at  this area with minimal redness. Will treat as pes anserine bursitis with celebrex (has previously tolerated well) and voltaren gel. Given warmth, will cover for recurrent cellulitis with restarting keflex course (TID).  F/u 5 d in office. Pt agrees with plan.           Meds ordered this encounter  Medications  . cephALEXin (KEFLEX) 500 MG capsule    Sig: Take 1 capsule (500 mg total) by mouth 3 (three) times daily.    Dispense:  30 capsule    Refill:  0  . celecoxib (CELEBREX) 100 MG capsule    Sig: Take 1 capsule (100 mg total) by mouth 2 (two) times daily.    Dispense:  30 capsule    Refill:  0  . diclofenac sodium (VOLTAREN) 1 % GEL    Sig: Apply 1 application topically 3 (three) times daily.    Dispense:  1 Tube    Refill:  0   No orders of the defined types were placed in this encounter.   Follow up plan: Return if symptoms worsen or fail to improve.  Ria Bush, MD

## 2018-08-22 ENCOUNTER — Encounter: Payer: Self-pay | Admitting: Family Medicine

## 2018-08-22 ENCOUNTER — Ambulatory Visit (INDEPENDENT_AMBULATORY_CARE_PROVIDER_SITE_OTHER): Payer: Medicare Other | Admitting: Family Medicine

## 2018-08-22 VITALS — BP 120/70 | HR 76 | Temp 98.0°F | Ht 65.0 in | Wt 163.2 lb

## 2018-08-22 DIAGNOSIS — M79605 Pain in left leg: Secondary | ICD-10-CM

## 2018-08-22 DIAGNOSIS — M7989 Other specified soft tissue disorders: Secondary | ICD-10-CM

## 2018-08-22 DIAGNOSIS — I83813 Varicose veins of bilateral lower extremities with pain: Secondary | ICD-10-CM

## 2018-08-22 DIAGNOSIS — I872 Venous insufficiency (chronic) (peripheral): Secondary | ICD-10-CM | POA: Diagnosis not present

## 2018-08-22 NOTE — Progress Notes (Signed)
BP 120/70 (BP Location: Left Arm, Patient Position: Sitting, Cuff Size: Normal)   Pulse 76   Temp 98 F (36.7 C) (Oral)   Ht 5\' 5"  (1.651 m)   Wt 163 lb 4 oz (74 kg)   SpO2 95%   BMI 27.17 kg/m    CC: f/u visit Subjective:    Patient ID: Beth Rodgers, female    DOB: [birth year], 83 y.o.   MRN: [id number]  HPI: Beth Rodgers is a 83 y.o. female presenting on 08/22/2018 for Follow-up (Pt still has pain and swelling in lower left leg.)   See prior notes for details. Persistent pain and swelling of left lower leg despite completing treatment with initial keflex then azithromycin. Last week she endorsed increasing pain and swelling and redness to lower leg below knee. Treated for pes anserine bursitis with compression stockings, arnica cream, and Rx voltaren gel and celebrex 100mg  bid. Also treated with repeat keflex course x10 days.   Known venous stasis and dermatitis.   Relevant past medical, surgical, family and social history reviewed and updated as indicated. Interim medical history since our last visit reviewed. Allergies and medications reviewed and updated. Outpatient Medications Prior to Visit  Medication Sig Dispense Refill  . Ascorbic Acid (VITAMIN C CR PO) Take by mouth as directed. Reported on 12/17/2015    . augmented betamethasone dipropionate (DIPROLENE-AF) 0.05 % cream 2 (two) times daily as needed. Appy to active flares on body twice a day as needed  1  . CALCIUM CARBONATE PO Take 600 mg by mouth as directed. Reported on 12/17/2015    . celecoxib (CELEBREX) 100 MG capsule Take 1 capsule (100 mg total) by mouth 2 (two) times daily. 30 capsule 0  . cephALEXin (KEFLEX) 500 MG capsule Take 1 capsule (500 mg total) by mouth 3 (three) times daily. 30 capsule 0  . Cholecalciferol (VITAMIN D) 2000 units CAPS Take 1 capsule (2,000 Units total) by mouth daily.    Marland Kitchen CICLOPIROX EX Apply topically as needed. Reported on 12/17/2015    . desonide (DESOWEN) 0.05 % cream APPLY TO  AFFECTED AREA TWICE A DAY 30 g 1  . diclofenac sodium (VOLTAREN) 1 % GEL Apply 1 application topically 3 (three) times daily. 1 Tube 0  . halobetasol (ULTRAVATE) 0.05 % ointment APPLY TO AFFECTED AREA TWICE A DAY  1  . levothyroxine (SYNTHROID, LEVOTHROID) 75 MCG tablet TAKE 1 TABLET BY MOUTH EVERY DAY 90 tablet 1  . losartan (COZAAR) 100 MG tablet Take 1 tablet (100 mg total) by mouth daily. 90 tablet 3  . Multiple Vitamin (MULTIVITAMIN) tablet Take 1 tablet by mouth daily. Reported on 12/17/2015    . NITROSTAT 0.4 MG SL tablet Reported on 04/16/2016    . Travoprost, BAK Free, (TRAVATAN) 0.004 % SOLN ophthalmic solution Place 1 drop into both eyes at bedtime.     No facility-administered medications prior to visit.      Per HPI unless specifically indicated in ROS section below Review of Systems     Objective:    BP 120/70 (BP Location: Left Arm, Patient Position: Sitting, Cuff Size: Normal)   Pulse 76   Temp 98 F (36.7 C) (Oral)   Ht 5\' 5"  (1.651 m)   Wt 163 lb 4 oz (74 kg)   SpO2 95%   BMI 27.17 kg/m   Wt Readings from Last 3 Encounters:  08/22/18 163 lb 4 oz (74 kg)  08/18/18 162 lb (73.5 kg)  08/12/18 161 lb (73  kg)    Physical Exam  Constitutional: She appears well-developed and well-nourished. No distress.  Musculoskeletal: She exhibits edema.  Mild pitting edema BLE below shin, more marked localized edema superior anterior lower leg, improving tenderness B pes anserine bursa tender to palpation, improving  Skin: Skin is warm and dry. There is erythema (minimal at anterior superior lower leg below knee).  Nursing note and vitals reviewed.  Results for orders placed or performed in visit on 04/01/18  Fecal Occult Blood, Guaiac  Result Value Ref Range   Fecal Occult Blood Negative       Assessment & Plan:   Problem List Items Addressed This Visit    Varicose veins of bilateral lower extremities with pain   Pain and swelling of lower extremity - Primary     Improving swelling and discomfort of superior left lower leg points to slow recovery. Anticipate component of bursitis as well as localized skin edema possibly from recent cellulitis and recent fall contributing to poor lymphatic drainage. Continue celebrex, keflex, and voltaren, further supportive care reviewed including compression stocking use and leg elevation. Update if any worsening or not continuing to improve as expected.       Chronic venous insufficiency       No orders of the defined types were placed in this encounter.  No orders of the defined types were placed in this encounter.   Follow up plan: Return if symptoms worsen or fail to improve.  Ria Bush, MD

## 2018-08-22 NOTE — Patient Instructions (Signed)
si creo que va mejorando. puede subir frequencia de voltaren gel a 2-3 veces al dia. termine keflex - cuando lo termine, puede subir frequencia de celebrex a 2 veces al dia.  siga con compresion, siga con elevacion.

## 2018-08-22 NOTE — Assessment & Plan Note (Signed)
Improving swelling and discomfort of superior left lower leg points to slow recovery. Anticipate component of bursitis as well as localized skin edema possibly from recent cellulitis and recent fall contributing to poor lymphatic drainage. Continue celebrex, keflex, and voltaren, further supportive care reviewed including compression stocking use and leg elevation. Update if any worsening or not continuing to improve as expected.

## 2018-08-23 ENCOUNTER — Ambulatory Visit: Payer: Medicare Other | Admitting: Family Medicine

## 2018-10-04 DIAGNOSIS — M25569 Pain in unspecified knee: Secondary | ICD-10-CM | POA: Insufficient documentation

## 2018-10-04 DIAGNOSIS — S8002XA Contusion of left knee, initial encounter: Secondary | ICD-10-CM | POA: Diagnosis not present

## 2018-10-04 DIAGNOSIS — M25562 Pain in left knee: Secondary | ICD-10-CM | POA: Diagnosis not present

## 2018-10-04 DIAGNOSIS — S8000XA Contusion of unspecified knee, initial encounter: Secondary | ICD-10-CM | POA: Insufficient documentation

## 2018-10-06 DIAGNOSIS — Z23 Encounter for immunization: Secondary | ICD-10-CM | POA: Diagnosis not present

## 2018-10-17 ENCOUNTER — Ambulatory Visit (INDEPENDENT_AMBULATORY_CARE_PROVIDER_SITE_OTHER): Payer: Medicare Other | Admitting: Vascular Surgery

## 2018-11-15 ENCOUNTER — Ambulatory Visit: Payer: Medicare Other

## 2018-11-17 ENCOUNTER — Ambulatory Visit: Payer: Medicare Other

## 2018-11-21 ENCOUNTER — Ambulatory Visit: Payer: Medicare Other

## 2018-11-22 DIAGNOSIS — H401131 Primary open-angle glaucoma, bilateral, mild stage: Secondary | ICD-10-CM | POA: Diagnosis not present

## 2018-11-23 ENCOUNTER — Ambulatory Visit: Payer: Medicare Other

## 2018-11-28 ENCOUNTER — Ambulatory Visit (INDEPENDENT_AMBULATORY_CARE_PROVIDER_SITE_OTHER): Payer: Medicare Other | Admitting: Vascular Surgery

## 2018-11-28 ENCOUNTER — Encounter (INDEPENDENT_AMBULATORY_CARE_PROVIDER_SITE_OTHER): Payer: Self-pay | Admitting: Vascular Surgery

## 2018-11-28 VITALS — BP 140/81 | HR 77 | Resp 16 | Ht 65.0 in | Wt 162.4 lb

## 2018-11-28 DIAGNOSIS — I10 Essential (primary) hypertension: Secondary | ICD-10-CM

## 2018-11-28 DIAGNOSIS — I890 Lymphedema, not elsewhere classified: Secondary | ICD-10-CM

## 2018-11-28 DIAGNOSIS — I872 Venous insufficiency (chronic) (peripheral): Secondary | ICD-10-CM

## 2018-11-28 DIAGNOSIS — M159 Polyosteoarthritis, unspecified: Secondary | ICD-10-CM

## 2018-11-28 DIAGNOSIS — M150 Primary generalized (osteo)arthritis: Secondary | ICD-10-CM

## 2018-11-28 NOTE — Progress Notes (Signed)
MRN : [id number]  Beth Rodgers is a 83 y.o. ([birth year]) female who presents with chief complaint of No chief complaint on file. Marland Kitchen  History of Present Illness:   The patient returns to the office for followup evaluation regarding leg swelling.  The swelling has persisted but with the lymph pump is much, much better controlled. The pain associated with swelling is essentially eliminated. There have not been any interval development of a ulcerations or wounds.  She did have an episode of cellulitis which has now been resolved.  She did go to [place] for several months and did well with compression.  The patient denies problems with the pump, noting it is working well and the leggings are in good condition.  Since the previous visit the patient has been wearing graduated compression stockings and using the lymph pump on a routine basis and  has noted significant improvement in the lymphedema.      No outpatient medications have been marked as taking for the 11/28/18 encounter (Appointment) with Delana Meyer, Dolores Lory, MD.    Past Medical History:  Diagnosis Date  . Cellulitis 08/10/2018  . Chronic venous insufficiency    with varicose veins  . Glaucoma   . History  of basal cell carcinoma    left nare  . History of chicken pox   . Hypothyroid   . Osteoarthritis    back pain, L knee pain  . Osteopenia 06/2009   DEXA 11/2015: T -2.3 hip, -2.2 spine, on longterm bisphosphonate/evista  . Pyogenic granuloma 04/16/2016    Past Surgical History:  Procedure Laterality Date  . BREAST BIOPSY     core  . CATARACT EXTRACTION     bilateral  . DEXA  06/2009   T score Spine: -1.8, hip -2.0  . Foam sclerotherapy Bilateral 09/2015   [place]  . NASAL RECONSTRUCTION  2005   for BCC L nare s/p Mohs  . nuclear stress test  2014   normal in [place]  . RADIOFREQUENCY ABLATION Left 01/2012   remnant L G saphenous vein below knee  . RADIOFREQUENCY ABLATION Right 02/2013   RLE  vein ablation   . VEIN LIGATION AND STRIPPING  remote   bilateral G saphenous veins    Social History Social History   Tobacco Use  . Smoking status: Never Smoker  . Smokeless tobacco: Never Used  Substance Use Topics  . Alcohol use: Yes    Comment: Occasionally drinks wine  . Drug use: No    Family History Family History  Problem Relation Age of Onset  . Cancer Mother        thyroid cancer  . Heart disease Father        CHF  . Diabetes Father   . Glaucoma Father   . Arthritis Father   . Coronary artery disease Maternal Uncle   . Bipolar disorder Son   . Stroke Neg Hx   . Breast cancer Neg Hx     No Known Allergies   REVIEW OF SYSTEMS (Negative unless checked)  Constitutional: [] Weight loss  [] Fever  [] Chills Cardiac: [] Chest pain   [] Chest pressure   [] Palpitations   [] Shortness of breath when laying flat   [] Shortness of breath with exertion. Vascular:  [] Pain in legs with walking   [] Pain in legs at rest  [] History of DVT   [] Phlebitis   [x] Swelling in legs   [x] Varicose veins   [] Non-healing ulcers Pulmonary:   [] Uses home oxygen   [] Productive  cough   [] Hemoptysis   [] Wheeze  [] COPD   [] Asthma Neurologic:  [] Dizziness   [] Seizures   [] History of stroke   [] History of TIA  [] Aphasia   [] Vissual changes   [] Weakness or numbness in arm   [] Weakness or numbness in leg Musculoskeletal:   [] Joint swelling   [x] Joint pain   [] Low back pain Hematologic:  [] Easy bruising  [] Easy bleeding   [] Hypercoagulable state   [] Anemic Gastrointestinal:  [] Diarrhea   [] Vomiting  [] Gastroesophageal reflux/heartburn   [] Difficulty swallowing. Genitourinary:  [] Chronic kidney disease   [] Difficult urination  [] Frequent urination   [] Blood in urine Skin:  [] Rashes   [] Ulcers  Psychological:  [] History of anxiety   []  History of major depression.  Physical Examination  There were no vitals filed for this visit. There is no height or weight on file to calculate BMI. Gen: WD/WN,  NAD Head: [place]/AT, No temporalis wasting.  Ear/Nose/Throat: Hearing grossly intact, nares w/o erythema or drainage Eyes: PER, EOMI, sclera nonicteric.  Neck: Supple, no large masses.   Pulmonary:  Good air movement, no audible wheezing bilaterally, no use of accessory muscles.  Cardiac: RRR, no JVD Vascular: scattered varicosities present bilaterally.  moderate venous stasis changes to the legs bilaterally.  2-3+ soft pitting edema Vessel Right Left  Radial Palpable Palpable  PT Palpable Palpable  DP Palpable Palpable  Gastrointestinal: Non-distended. No guarding/no peritoneal signs.  Musculoskeletal: M/S 5/5 throughout.  No deformity or atrophy.  Neurologic: CN 2-12 intact. Symmetrical.  Speech is fluent. Motor exam as listed above. Psychiatric: Judgment intact, Mood & affect appropriate for pt's clinical situation. Dermatologic: venous rashes no ulcers noted.  No changes consistent with cellulitis. Lymph : No lichenification or skin changes of chronic lymphedema.  CBC Lab Results  Component Value Date   WBC 5.9 06/25/2017   HGB 13.4 06/25/2017   HCT 39.9 06/25/2017   MCV 87.5 06/25/2017   PLT 215.0 06/25/2017    BMET    Component Value Date/Time   NA 140 03/09/2018 1047   K 4.1 03/09/2018 1047   CL 106 03/09/2018 1047   CO2 28 03/09/2018 1047   GLUCOSE 83 03/09/2018 1047   BUN 13 03/09/2018 1047   CREATININE 0.62 03/09/2018 1047   CALCIUM 9.1 03/09/2018 1047   CrCl cannot be calculated (Patient's most recent lab result is older than the maximum 21 days allowed.).  COAG No results found for: INR, PROTIME  Radiology No results found.    Assessment/Plan 1. Chronic venous insufficiency  No surgery or intervention at this point in time.    I have reviewed my discussion with the patient regarding lymphedema and why it  causes symptoms.  Patient will continue wearing graduated compression stockings class 1 (20-30 mmHg) on a daily basis a prescription was given. The  patient is reminded to put the stockings on first thing in the morning and removing them in the evening. The patient is instructed specifically not to sleep in the stockings.   In addition, behavioral modification throughout the day will be continued.  This will include frequent elevation (such as in a recliner), use of over the counter pain medications as needed and exercise such as walking.  I have reviewed systemic causes for chronic edema such as liver, kidney and cardiac etiologies and there does not appear to be any significant changes in these organ systems over the past year.  The patient is under the impression that these organ systems are all stable and unchanged.  The patient will continue aggressive use of the  lymph pump.  This will continue to improve the edema control and prevent sequela such as ulcers and infections.   The patient will follow-up with me on an annual basis.    2. Lymphedema  No surgery or intervention at this point in time.    I have reviewed my discussion with the patient regarding lymphedema and why it  causes symptoms.  Patient will continue wearing graduated compression stockings class 1 (20-30 mmHg) on a daily basis a prescription was given. The patient is reminded to put the stockings on first thing in the morning and removing them in the evening. The patient is instructed specifically not to sleep in the stockings.   In addition, behavioral modification throughout the day will be continued.  This will include frequent elevation (such as in a recliner), use of over the counter pain medications as needed and exercise such as walking.  I have reviewed systemic causes for chronic edema such as liver, kidney and cardiac etiologies and there does not appear to be any significant changes in these organ systems over the past year.  The patient is under the impression that these organ systems are all stable and unchanged.    The patient will continue aggressive use  of the  lymph pump.  This will continue to improve the edema control and prevent sequela such as ulcers and infections.   The patient will follow-up with me on an annual basis.    3. Essential hypertension Continue antihypertensive medications as already ordered, these medications have been reviewed and there are no changes at this time.   4. Primary osteoarthritis involving multiple joints Continue NSAID medications as already ordered, these medications have been reviewed and there are no changes at this time.  Continued activity and therapy was stressed.    Hortencia Pilar, MD  11/28/2018 9:26 AM

## 2018-11-29 ENCOUNTER — Ambulatory Visit: Payer: Medicare Other | Attending: Sports Medicine

## 2018-11-29 ENCOUNTER — Other Ambulatory Visit: Payer: Self-pay

## 2018-11-29 DIAGNOSIS — Z9181 History of falling: Secondary | ICD-10-CM | POA: Insufficient documentation

## 2018-11-29 DIAGNOSIS — M25562 Pain in left knee: Secondary | ICD-10-CM | POA: Insufficient documentation

## 2018-11-29 DIAGNOSIS — H401123 Primary open-angle glaucoma, left eye, severe stage: Secondary | ICD-10-CM | POA: Diagnosis not present

## 2018-11-29 DIAGNOSIS — M6281 Muscle weakness (generalized): Secondary | ICD-10-CM | POA: Insufficient documentation

## 2018-11-29 NOTE — Therapy (Signed)
[facility] [address] [place], [address] Phone: [phone]   Fax:  [phone]  Physical Therapy Evaluation  Patient Details  Name: Beth Rodgers MRN: [id number] Date of Birth: 07-Aug-1935 Referring Provider (PT): Vickki Hearing, MD   Encounter Date: 11/29/2018  PT End of Session - 11/29/18 1327    Visit Number  1    Number of Visits  17    Date for PT Re-Evaluation  01/26/19    Authorization Type  1    Authorization Time Period  of 10 progress report    PT Start Time  1327    PT Stop Time  1429    PT Time Calculation (min)  62 min    Equipment Utilized During Treatment  Gait belt    Activity Tolerance  Patient tolerated treatment well    Behavior During Therapy  [facility] for tasks assessed/performed       Past Medical History:  Diagnosis Date  . Cellulitis 08/10/2018  . Chronic venous insufficiency    with varicose veins  . Glaucoma   . History  of basal cell carcinoma    left nare  . History of chicken pox   . Hypothyroid   . Osteoarthritis    back pain, L knee pain  . Osteopenia 06/2009   DEXA 11/2015: T -2.3 hip, -2.2 spine, on longterm bisphosphonate/evista  . Pyogenic granuloma 04/16/2016    Past Surgical History:  Procedure Laterality Date  . BREAST BIOPSY     core  . CATARACT EXTRACTION     bilateral  . DEXA  06/2009   T score Spine: -1.8, hip -2.0  . Foam sclerotherapy Bilateral 09/2015   [place], [place]  . NASAL RECONSTRUCTION  2005   for BCC L nare s/p Mohs  . nuclear stress test  2014   normal in [place]  . RADIOFREQUENCY ABLATION Left 01/2012   remnant L G saphenous vein below knee  . RADIOFREQUENCY ABLATION Right 02/2013   RLE vein ablation   . VEIN LIGATION AND STRIPPING  remote   bilateral G saphenous veins    There were no vitals filed for this visit.   Subjective Assessment - 11/29/18 1332    Subjective  L knee pain: 3/10 currently (pt sitting), 1/10 at  best, 7/10 at worst (3 months ago after falling at the hospital, pt tripped on her R foot).     Pertinent History  Pt states that she has fallen 2x in the past year, once when she was sitting on a stool, the other time pt tripped on her R foot. Has a difficult time getting up due to weakness.  Nervous about being by herself if she fell. Has been doing her HEP daily.  Pt wears an inflatable machine for her legs.  Pt thinks her cellulitis is gone.  Feels pain lateral knee. Difficulty getting up from the floor because has a hard time kneeling due to knee pain.   Fell 3 times in the last 6 months (once falling off a stool and 2x tripping on her foot, does not remember which foot she tripped with, both falls when she tripped, pt fell on her L side).  Pain has improved since onset a little over 3 months ago. Unknown method of injury. Pt noticed swelling in L lateral knee before her falls.  Pt also states feeling pain in her neck after working on the computer or cooking.     Patient Stated  Goals  Want to be able to get stronger and be able to get up from the floor.     Currently in Pain?  Yes    Pain Score  3     Pain Location  Knee    Pain Orientation  Left    Pain Descriptors / Indicators  Sore;Tender    Pain Type  Chronic pain    Pain Onset  More than a month ago    Pain Frequency  Occasional    Aggravating Factors   L knee: going up and down stairs, stepping up onto a curb    Pain Relieving Factors  using medication gel, rest and propping her legs up.          [facility] PT Assessment - 11/29/18 1348      Assessment   Medical Diagnosis  L knee contusion    Referring Provider (PT)  Vickki Hearing, MD    Onset Date/Surgical Date  10/04/18   date PT referral signed   Prior Therapy  Pt participated in PT before for another problem with positive results      Precautions   Precaution Comments  possible fall risk      Restrictions   Other Position/Activity Restrictions  no known weight bearing  restrictions      Balance Screen   Has the patient fallen in the past 6 months  Yes    How many times?  3      Prior Function   Vocation Requirements  PLOF: better able to negotiate steps with minimal to no knee pain      Observation/Other Assessments   Observations  decreased bilateral femoral control with sit <> stand transfers as well as with stair negotiation.       Posture/Postural Control   Posture Comments  protracted neck and shoulders, L shoulder higher, R lumbar convexity, L thoracic convexity, B genu valgus, L tibia in ER      AROM   Overall AROM Comments  L lateral knee pain with flexion with medial leg rotation > R rotation.     Right Knee Extension  -10   -6 degrees in quad set position   Right Knee Flexion  132    Left Knee Extension  -10   -6 in quad set position   Left Knee Flexion  135      Strength   Right Hip Flexion  4-/5    Right Hip Extension  4+/5   seated manually resisted hip extension   Right Hip ABduction  3+/5    Left Hip Flexion  4/5    Left Hip Extension  4+/5   seated manually resisted hip extension   Left Hip ABduction  4-/5    Right Knee Flexion  4/5    Right Knee Extension  4+/5    Left Knee Flexion  4/5    Left Knee Extension  4+/5      Palpation   Palpation comment  TTP L lateral knee > medial knee inferior to joint line      Ambulation/Gait   Gait Comments  Antalgic, decreased stance R LE, pelvic drop during stance phase bilaterally, decreased B knee extension during stance phase, B femoral adduction and IR during stance phase of gait.       Dynamic Gait Index   Level Surface  Mild Impairment    Change in Gait Speed  Mild Impairment    Gait with Horizontal Head Turns  Moderate Impairment   decreased  balance with L cervical rotation   Gait with Vertical Head Turns  Moderate Impairment   decreased balance with cervical extension   Gait and Pivot Turn  Mild Impairment    Step Over Obstacle  Mild Impairment    Step Around  Obstacles  Normal    Steps  Mild Impairment    Total Score  15    DGI comment:  < 19 suggests increase in fall risk                Objective measurements completed on examination: See above findings.   TTP L lateral knee inferior to joint line. Pt states that the pain feels like its from inflammation.    Pt was recommended to use ice for her L knee to help with swelling. Pt verbalized understanding.      Patient is an 83 year old female who came to physical therapy secondary to L knee contusion. Pt also presents with L lateral knee swelling, TTP, L lateral knee pain > medial knee, decreased bilateral femoral control, altered gait pattern and posture, bilateral LE weakness, and difficulty performing functional tasks such as stair negotiation, as well as floor to stand transfers. Pt will benefit from skilled physical therapy services to address the aforementioned deficits.       PT Education - 11/29/18 1854    Education Details  plan of care    Person(s) Educated  Patient    Methods  Explanation    Comprehension  Verbalized understanding       PT Short Term Goals - 11/29/18 1451      PT SHORT TERM GOAL #1   Title  Patient will be independent with her HEP to improve strength, decrease L knee pain, improve function.     Time  3    Period  Weeks    Status  New    Target Date  12/22/18        PT Long Term Goals - 11/29/18 1451      PT LONG TERM GOAL #1   Title  Patient will have a decrease in L knee pain to 3/10 or less at worst to promote ability to perform standing tasks such as stair negotiation more comfortably.     Baseline  7/10 L knee pain at worst for the past 3 months (11/29/2018)    Time  8    Period  Weeks    Status  New    Target Date  01/26/19      PT LONG TERM GOAL #2   Title  Patient will improve bilateral LE strength by at least 1/2 MMT grade to promote ability to perform functional tasks such as stair negotiation as well as floor to stand  transfers.     Time  6    Period  Weeks    Status  New    Target Date  01/26/19      PT LONG TERM GOAL #3   Title  Patient will be able to perform floor to stand transfer with mod independent to promote ability to get up from the floor if patient falls (pt goal).     Baseline  Pt needs assist to get up from the floor after a fall per subjective reports (11/29/2018).     Time  8    Period  Weeks    Status  New    Target Date  01/26/19      PT LONG TERM GOAL #4   Title  Patient will improve her DGI score to at least 19/24 as a demonstration of decreased fall risk.     Baseline  15/24 (11/29/2018)    Time  8    Period  Weeks    Status  New    Target Date  01/26/19             Plan - 11/29/18 1432    Clinical Impression Statement   Patient is an 83 year old female who came to physical therapy secondary to L knee contusion. Pt also presents with L lateral knee swelling, TTP, L lateral knee pain > medial knee, decreased bilateral femoral control, altered gait pattern and posture, bilateral LE weakness, and difficulty performing functional tasks such as stair negotiation, as well as floor to stand transfers. Pt will benefit from skilled physical therapy services to address the aforementioned deficits.     History and Personal Factors relevant to plan of care:  L knee pain, LE weakness, hx of multiple falls, age    Clinical Presentation  Stable    Clinical Presentation due to:  L knee pain seems to have improved based on subjective reports     Clinical Decision Making  Low    Rehab Potential  Good    Clinical Impairments Affecting Rehab Potential  (-) L knee pain, LE weakness, hx of falls; (+) motivated    PT Frequency  2x / week    PT Duration  8 weeks    PT Treatment/Interventions  Aquatic Therapy;Electrical Stimulation;Iontophoresis 4mg /ml Dexamethasone;Gait training;Stair training;Functional mobility training;Therapeutic activities;Therapeutic exercise;Balance training;Neuromuscular  re-education;Patient/family education;Manual techniques   electrical stimulation related treatment and ultrasound if appropriate   PT Next Visit Plan  glute, trunk, scapular strengthening, femoral control, balance training, manual techniques, modalities PRN    Consulted and Agree with Plan of Care  Patient       Patient will benefit from skilled therapeutic intervention in order to improve the following deficits and impairments:  Postural dysfunction, Abnormal gait, Decreased balance, Decreased range of motion, Decreased strength, Improper body mechanics  Visit Diagnosis: Muscle weakness (generalized) - Plan: PT plan of care cert/re-cert  Left knee pain, unspecified chronicity - Plan: PT plan of care cert/re-cert  History of falling - Plan: PT plan of care cert/re-cert     Problem List Patient Active Problem List   Diagnosis Date Noted  . Itching 08/10/2018  . Varicose veins of bilateral lower extremities with pain 03/30/2018  . Lymphedema 03/30/2018  . Dizziness 02/02/2017  . Situational depression 12/18/2016  . Health maintenance examination 10/23/2016  . Fatigue 08/12/2016  . Essential hypertension 12/17/2015  . Dermatitis of external ear 12/17/2015  . Corn of foot 05/16/2015  . Advanced care planning/counseling discussion 10/30/2014  . Knee pain 08/17/2013  . Oropharyngeal dysphagia 11/15/2012  . Difficulty with family 03/30/2012  . Medicare annual wellness visit, subsequent 03/30/2012  . History of basal cell carcinoma   . Osteoarthritis   . Osteopenia   . Hypothyroidism   . Glaucoma   . Chronic venous insufficiency     Joneen Boers PT, DPT   11/29/2018, 6:59 PM  [facility] [address], [place], [address] Phone: [phone]   Fax:  [phone]  Name: Beth Rodgers MRN: [id number] Date of Birth: 15-Jul-1935

## 2018-12-01 ENCOUNTER — Ambulatory Visit: Payer: Medicare Other

## 2018-12-01 DIAGNOSIS — M6281 Muscle weakness (generalized): Secondary | ICD-10-CM

## 2018-12-01 DIAGNOSIS — M25562 Pain in left knee: Secondary | ICD-10-CM

## 2018-12-01 DIAGNOSIS — Z9181 History of falling: Secondary | ICD-10-CM | POA: Diagnosis not present

## 2018-12-01 NOTE — Therapy (Signed)
[facility] [address], [place], [address] Phone: [phone]   Fax:  [phone]  Physical Therapy Treatment  Patient Details  Name: Beth Rodgers MRN: [id number] Date of Birth: 1935-05-05 Referring Provider (PT): Vickki Hearing, MD   Encounter Date: 12/01/2018  PT End of Session - 12/01/18 1350    Visit Number  2    Number of Visits  17    Date for PT Re-Evaluation  01/26/19    Authorization Type  2    Authorization Time Period  of 10 progress report    PT Start Time  1350    PT Stop Time  1433    PT Time Calculation (min)  43 min    Equipment Utilized During Treatment  Gait belt    Activity Tolerance  Patient tolerated treatment well    Behavior During Therapy  WFL for tasks assessed/performed       Past Medical History:  Diagnosis Date  . Cellulitis 08/10/2018  . Chronic venous insufficiency    with varicose veins  . Glaucoma   . History  of basal cell carcinoma    left nare  . History of chicken pox   . Hypothyroid   . Osteoarthritis    back pain, L knee pain  . Osteopenia 06/2009   DEXA 11/2015: T -2.3 hip, -2.2 spine, on longterm bisphosphonate/evista  . Pyogenic granuloma 04/16/2016    Past Surgical History:  Procedure Laterality Date  . BREAST BIOPSY     core  . CATARACT EXTRACTION     bilateral  . DEXA  06/2009   T score Spine: -1.8, hip -2.0  . Foam sclerotherapy Bilateral 09/2015   [place], [place]  . NASAL RECONSTRUCTION  2005   for BCC L nare s/p Mohs  . nuclear stress test  2014   normal in [place]  . RADIOFREQUENCY ABLATION Left 01/2012   remnant L G saphenous vein below knee  . RADIOFREQUENCY ABLATION Right 02/2013   RLE vein ablation   . VEIN LIGATION AND STRIPPING  remote   bilateral G saphenous veins    There were no vitals filed for this visit.  Subjective Assessment - 12/01/18 1352    Subjective  L knee feels ok. No pain currently.     Pertinent  History  Pt states that she has fallen 2x in the past year, once when she was sitting on a stool, the other time pt tripped on her R foot. Has a difficult time getting up due to weakness.  Nervous about being by herself if she fell. Has been doing her HEP daily.  Pt wears an inflatable machine for her legs.  Pt thinks her cellulitis is gone.  Feels pain lateral knee. Difficulty getting up from the floor because has a hard time kneeling due to knee pain.   Fell 3 times in the last 6 months (once falling off a stool and 2x tripping on her foot, does not remember which foot she tripped with, both falls when she tripped, pt fell on her L side).  Pain has improved since onset a little over 3 months ago. Unknown method of injury. Pt noticed swelling in L lateral knee before her falls.  Pt also states feeling pain in her neck after working on the computer or cooking.     Patient Stated Goals  Want to be able to get stronger and be able to get up from the floor.  Currently in Pain?  No/denies    Pain Score  0-No pain    Pain Onset  More than a month ago                               PT Education - 12/01/18 1405    Education Details  ther-ex    Person(s) Educated  Patient    Methods  Explanation;Demonstration;Tactile cues;Verbal cues    Comprehension  Verbalized understanding;Returned demonstration      Objectives  No latex band allergies   Medbridge Access Code: [email or web address]    Therapeutic exercise   Sit <> stand from regular chair with arms, emphasis on femoral control 5x   T-band standing hip abduction with B UE assist with towel roll padding 10x, then 5x2 each LE  standing hip extension with yellow band around thighs and towel padding, B UE assist 10x2 each LE  Reviewed HEP. Pt demonstrated and verbalized understanding.   running man with one UE assist to promote glute max strengthening.   L LE 10x  R LE 10x  Forward step up onto Air Ex pad with one UE assist  L  LE 10x  R LE 10x  Seated hip ER resisting 2 lbs  R 10x2  L 10x2    Try some standing shoulder extension and biceps strengthenign next visit if appropriate to help with floor to stand transfers.   Improved exercise technique, movement at target joints, use of target muscles after mod verbal, visual, tactile cues.   No complain of increased L knee pain during session. Pt needed cues for femoral control with exercises. Worked on glute med and max strengthening to help with femoral control and decrease L lateral knee pain.  Pt will benefit from continued skilled physical therapy services to decrease pain and improve function.      PT Short Term Goals - 11/29/18 1451      PT SHORT TERM GOAL #1   Title  Patient will be independent with her HEP to improve strength, decrease L knee pain, improve function.     Time  3    Period  Weeks    Status  New    Target Date  12/22/18        PT Long Term Goals - 11/29/18 1451      PT LONG TERM GOAL #1   Title  Patient will have a decrease in L knee pain to 3/10 or less at worst to promote ability to perform standing tasks such as stair negotiation more comfortably.     Baseline  7/10 L knee pain at worst for the past 3 months (11/29/2018)    Time  8    Period  Weeks    Status  New    Target Date  01/26/19      PT LONG TERM GOAL #2   Title  Patient will improve bilateral LE strength by at least 1/2 MMT grade to promote ability to perform functional tasks such as stair negotiation as well as floor to stand transfers.     Time  6    Period  Weeks    Status  New    Target Date  01/26/19      PT LONG TERM GOAL #3   Title  Patient will be able to perform floor to stand transfer with mod independent to promote ability to get up from the floor if patient falls (pt goal).  Baseline  Pt needs assist to get up from the floor after a fall per subjective reports (11/29/2018).     Time  8    Period  Weeks    Status  New    Target Date  01/26/19       PT LONG TERM GOAL #4   Title  Patient will improve her DGI score to at least 19/24 as a demonstration of decreased fall risk.     Baseline  15/24 (11/29/2018)    Time  8    Period  Weeks    Status  New    Target Date  01/26/19            Plan - 12/01/18 1349    Clinical Impression Statement  No complain of increased L knee pain during session. Pt needed cues for femoral control with exercises. Worked on glute med and max strengthening to help with femoral control and decrease L lateral knee pain.  Pt will benefit from continued skilled physical therapy services to decrease pain and improve function.     Rehab Potential  Good    Clinical Impairments Affecting Rehab Potential  (-) L knee pain, LE weakness, hx of falls; (+) motivated    PT Frequency  2x / week    PT Duration  8 weeks    PT Treatment/Interventions  Aquatic Therapy;Electrical Stimulation;Iontophoresis 4mg /ml Dexamethasone;Gait training;Stair training;Functional mobility training;Therapeutic activities;Therapeutic exercise;Balance training;Neuromuscular re-education;Patient/family education;Manual techniques   electrical stimulation related treatment and ultrasound if appropriate   PT Next Visit Plan  glute, trunk, scapular strengthening, femoral control, balance training, manual techniques, modalities PRN    Consulted and Agree with Plan of Care  Patient       Patient will benefit from skilled therapeutic intervention in order to improve the following deficits and impairments:  Postural dysfunction, Abnormal gait, Decreased balance, Decreased range of motion, Decreased strength, Improper body mechanics  Visit Diagnosis: Left knee pain, unspecified chronicity  Muscle weakness (generalized)  History of falling     Problem List Patient Active Problem List   Diagnosis Date Noted  . Itching 08/10/2018  . Varicose veins of bilateral lower extremities with pain 03/30/2018  . Lymphedema 03/30/2018  . Dizziness  02/02/2017  . Situational depression 12/18/2016  . Health maintenance examination 10/23/2016  . Fatigue 08/12/2016  . Essential hypertension 12/17/2015  . Dermatitis of external ear 12/17/2015  . Corn of foot 05/16/2015  . Advanced care planning/counseling discussion 10/30/2014  . Knee pain 08/17/2013  . Oropharyngeal dysphagia 11/15/2012  . Difficulty with family 03/30/2012  . Medicare annual wellness visit, subsequent 03/30/2012  . History of basal cell carcinoma   . Osteoarthritis   . Osteopenia   . Hypothyroidism   . Glaucoma   . Chronic venous insufficiency     Joneen Boers PT, DPT   12/01/2018, 3:47 PM  [facility] [address] [place], [address] Phone: [phone]   Fax:  [phone]  Name: Beth Rodgers MRN: [id number] Date of Birth: 05-Feb-1935

## 2018-12-01 NOTE — Patient Instructions (Addendum)
  Medbridge Access Code: [email or web address]   Standing Hip Abduction with Resistance at Ankles and Counter Support  Yellow band around thighs 4x5 each LE  Standing Hip Extension with Resistance at Ankles and Counter Support   10x2 each LE , yellow band around thighs   Pt to have a chair nearby for the exercises for safety. Pt verbalized understanding.

## 2018-12-07 ENCOUNTER — Ambulatory Visit: Payer: Medicare Other

## 2018-12-13 ENCOUNTER — Ambulatory Visit: Payer: Medicare Other

## 2018-12-13 DIAGNOSIS — Z9181 History of falling: Secondary | ICD-10-CM | POA: Diagnosis not present

## 2018-12-13 DIAGNOSIS — M25562 Pain in left knee: Secondary | ICD-10-CM | POA: Diagnosis not present

## 2018-12-13 DIAGNOSIS — M6281 Muscle weakness (generalized): Secondary | ICD-10-CM

## 2018-12-13 NOTE — Therapy (Signed)
[facility] [address], [place], [address] Phone: [phone]   Fax:  [phone]  Physical Therapy Treatment  Patient Details  Name: Beth Rodgers MRN: [id number] Date of Birth: 07-14-1935 Referring Provider (PT): Vickki Hearing, MD   Encounter Date: 12/13/2018  PT End of Session - 12/13/18 1420    Visit Number  3    Number of Visits  17    Date for PT Re-Evaluation  01/26/19    Authorization Type  3    Authorization Time Period  of 10 progress report    PT Start Time  1420    PT Stop Time  1501    PT Time Calculation (min)  41 min    Equipment Utilized During Treatment  Gait belt    Activity Tolerance  Patient tolerated treatment well    Behavior During Therapy  WFL for tasks assessed/performed       Past Medical History:  Diagnosis Date  . Cellulitis 08/10/2018  . Chronic venous insufficiency    with varicose veins  . Glaucoma   . History  of basal cell carcinoma    left nare  . History of chicken pox   . Hypothyroid   . Osteoarthritis    back pain, L knee pain  . Osteopenia 06/2009   DEXA 11/2015: T -2.3 hip, -2.2 spine, on longterm bisphosphonate/evista  . Pyogenic granuloma 04/16/2016    Past Surgical History:  Procedure Laterality Date  . BREAST BIOPSY     core  . CATARACT EXTRACTION     bilateral  . DEXA  06/2009   T score Spine: -1.8, hip -2.0  . Foam sclerotherapy Bilateral 09/2015   [facility], [place]  . NASAL RECONSTRUCTION  2005   for BCC L nare s/p Mohs  . nuclear stress test  2014   normal in [place]  . RADIOFREQUENCY ABLATION Left 01/2012   remnant L G saphenous vein below knee  . RADIOFREQUENCY ABLATION Right 02/2013   RLE vein ablation   . VEIN LIGATION AND STRIPPING  remote   bilateral G saphenous veins    There were no vitals filed for this visit.  Subjective Assessment - 12/13/18 1422    Subjective  L knee feels ok.  No pain currently. Its been improving  a lot. Neck is bothering her.     Pertinent History  Pt states that she has fallen 2x in the past year, once when she was sitting on a stool, the other time pt tripped on her R foot. Has a difficult time getting up due to weakness.  Nervous about being by herself if she fell. Has been doing her HEP daily.  Pt wears an inflatable machine for her legs.  Pt thinks her cellulitis is gone.  Feels pain lateral knee. Difficulty getting up from the floor because has a hard time kneeling due to knee pain.   Fell 3 times in the last 6 months (once falling off a stool and 2x tripping on her foot, does not remember which foot she tripped with, both falls when she tripped, pt fell on her L side).  Pain has improved since onset a little over 3 months ago. Unknown method of injury. Pt noticed swelling in L lateral knee before her falls.  Pt also states feeling pain in her neck after working on the computer or cooking.     Patient Stated Goals  Want to be able to get stronger and be  able to get up from the floor.     Currently in Pain?  No/denies    Pain Score  0-No pain   no L knee pain.    Pain Onset  More than a month ago                               PT Education - 12/13/18 1433    Education Details  ther-ex    Person(s) Educated  Patient    Methods  Explanation;Demonstration;Tactile cues;Verbal cues    Comprehension  Returned demonstration;Verbalized understanding      Objectives  No latex band allergies   Medbridge Access Code: [email or web address]    Therapeutic exercise   Chair to floor to chair transfer (using mat) mod to max assist due to weakness and B knee pain. Pillow used to cushion knees 1x  Total Gym height 26  Double leg squats 10x, then 6x   Emphasis on femoral control  PT assist to exit [employer]   standing leg press resisting double blue band with B UE assist   L LE10x2  R LE 10x2  Standing hip abduction 2 lbs with B UE assist  R 10x2  L 10x2  Standing B  shoulder extension resisting yellow band 10x3 with B scapular retraction to promote ability to pull herself up from the floor  Cues needed to decrease low back extension compensation  Treadmill bar push-ups 10x2  Seated hip ER resisting 2 lbs             R 10x2             L 10x2   Improved exercise technique, movement at target joints, use of target muscles after mod verbal, visual, tactile cues.   Difficulty with chair to floor to chair transfer, needing mod to max assist from PT to perform due to weakness and B knee pain. Continued working on LE strengthening (ie quads, glutes) to help pt perform transfer in the future. Improving L knee pain based on subjective reports. Pt will benefit from continued skilled physical therapy services to decrease pain, improve strength and function.        PT Short Term Goals - 11/29/18 1451      PT SHORT TERM GOAL #1   Title  Patient will be independent with her HEP to improve strength, decrease L knee pain, improve function.     Time  3    Period  Weeks    Status  New    Target Date  12/22/18        PT Long Term Goals - 11/29/18 1451      PT LONG TERM GOAL #1   Title  Patient will have a decrease in L knee pain to 3/10 or less at worst to promote ability to perform standing tasks such as stair negotiation more comfortably.     Baseline  7/10 L knee pain at worst for the past 3 months (11/29/2018)    Time  8    Period  Weeks    Status  New    Target Date  01/26/19      PT LONG TERM GOAL #2   Title  Patient will improve bilateral LE strength by at least 1/2 MMT grade to promote ability to perform functional tasks such as stair negotiation as well as floor to stand transfers.     Time  6    Period  Weeks    Status  New    Target Date  01/26/19      PT LONG TERM GOAL #3   Title  Patient will be able to perform floor to stand transfer with mod independent to promote ability to get up from the floor if patient falls (pt goal).      Baseline  Pt needs assist to get up from the floor after a fall per subjective reports (11/29/2018).     Time  8    Period  Weeks    Status  New    Target Date  01/26/19      PT LONG TERM GOAL #4   Title  Patient will improve her DGI score to at least 19/24 as a demonstration of decreased fall risk.     Baseline  15/24 (11/29/2018)    Time  8    Period  Weeks    Status  New    Target Date  01/26/19            Plan - 12/13/18 1419    Clinical Impression Statement  Difficulty with chair to floor to chair transfer, needing mod to max assist from PT to perform due to weakness and B knee pain. Continued working on LE strengthening (ie quads, glutes) to help pt perform transfer in the future. Improving L knee pain based on subjective reports. Pt will benefit from continued skilled physical therapy services to decrease pain, improve strength and function.     Rehab Potential  Good    Clinical Impairments Affecting Rehab Potential  (-) L knee pain, LE weakness, hx of falls; (+) motivated    PT Frequency  2x / week    PT Duration  8 weeks    PT Treatment/Interventions  Aquatic Therapy;Electrical Stimulation;Iontophoresis 4mg /ml Dexamethasone;Gait training;Stair training;Functional mobility training;Therapeutic activities;Therapeutic exercise;Balance training;Neuromuscular re-education;Patient/family education;Manual techniques   electrical stimulation related treatment and ultrasound if appropriate   PT Next Visit Plan  glute, trunk, scapular strengthening, femoral control, balance training, manual techniques, modalities PRN    Consulted and Agree with Plan of Care  Patient       Patient will benefit from skilled therapeutic intervention in order to improve the following deficits and impairments:  Postural dysfunction, Abnormal gait, Decreased balance, Decreased range of motion, Decreased strength, Improper body mechanics  Visit Diagnosis: Left knee pain, unspecified chronicity  Muscle  weakness (generalized)  History of falling     Problem List Patient Active Problem List   Diagnosis Date Noted  . Itching 08/10/2018  . Varicose veins of bilateral lower extremities with pain 03/30/2018  . Lymphedema 03/30/2018  . Dizziness 02/02/2017  . Situational depression 12/18/2016  . Health maintenance examination 10/23/2016  . Fatigue 08/12/2016  . Essential hypertension 12/17/2015  . Dermatitis of external ear 12/17/2015  . Corn of foot 05/16/2015  . Advanced care planning/counseling discussion 10/30/2014  . Knee pain 08/17/2013  . Oropharyngeal dysphagia 11/15/2012  . Difficulty with family 03/30/2012  . Medicare annual wellness visit, subsequent 03/30/2012  . History of basal cell carcinoma   . Osteoarthritis   . Osteopenia   . Hypothyroidism   . Glaucoma   . Chronic venous insufficiency    Joneen Boers PT, DPT   12/13/2018, 6:18 PM  [facility] [address] [place], [address] Phone: [phone]   Fax:  [phone]  Name: Beth Rodgers MRN: [id number] Date of Birth: [birth year]

## 2018-12-15 ENCOUNTER — Ambulatory Visit: Payer: Medicare Other

## 2018-12-15 DIAGNOSIS — M6281 Muscle weakness (generalized): Secondary | ICD-10-CM | POA: Diagnosis not present

## 2018-12-15 DIAGNOSIS — Z9181 History of falling: Secondary | ICD-10-CM | POA: Diagnosis not present

## 2018-12-15 DIAGNOSIS — M25562 Pain in left knee: Secondary | ICD-10-CM

## 2018-12-15 NOTE — Therapy (Signed)
[facility] [address], [place], [address] Phone: [phone]   Fax:  [phone]  Physical Therapy Treatment  Patient Details  Name: Beth Rodgers MRN: [id number] Date of Birth: 1935/07/15 Referring Provider (PT): Vickki Hearing, MD   Encounter Date: 12/15/2018  PT End of Session - 12/15/18 1035    Visit Number  4    Number of Visits  17    Date for PT Re-Evaluation  01/26/19    Authorization Type  4    Authorization Time Period  of 10 progress report    PT Start Time  1035   pt to sign in after session   PT Stop Time  1116    PT Time Calculation (min)  41 min    Equipment Utilized During Treatment  Gait belt    Activity Tolerance  Patient tolerated treatment well    Behavior During Therapy  [facility] for tasks assessed/performed       Past Medical History:  Diagnosis Date  . Cellulitis 08/10/2018  . Chronic venous insufficiency    with varicose veins  . Glaucoma   . History  of basal cell carcinoma    left nare  . History of chicken pox   . Hypothyroid   . Osteoarthritis    back pain, L knee pain  . Osteopenia 06/2009   DEXA 11/2015: T -2.3 hip, -2.2 spine, on longterm bisphosphonate/evista  . Pyogenic granuloma 04/16/2016    Past Surgical History:  Procedure Laterality Date  . BREAST BIOPSY     core  . CATARACT EXTRACTION     bilateral  . DEXA  06/2009   T score Spine: -1.8, hip -2.0  . Foam sclerotherapy Bilateral 09/2015   [place], [place]  . NASAL RECONSTRUCTION  2005   for BCC L nare s/p Mohs  . nuclear stress test  2014   normal in [place]  . RADIOFREQUENCY ABLATION Left 01/2012   remnant L G saphenous vein below knee  . RADIOFREQUENCY ABLATION Right 02/2013   RLE vein ablation   . VEIN LIGATION AND STRIPPING  remote   bilateral G saphenous veins    There were no vitals filed for this visit.  Subjective Assessment - 12/15/18 1036    Subjective  L knee is feeling well.  Tryng to be more conscious of her knee position. Was a little bit sore on the outside of the knees yesterday.      Pertinent History  Pt states that she has fallen 2x in the past year, once when she was sitting on a stool, the other time pt tripped on her R foot. Has a difficult time getting up due to weakness.  Nervous about being by herself if she fell. Has been doing her HEP daily.  Pt wears an inflatable machine for her legs.  Pt thinks her cellulitis is gone.  Feels pain lateral knee. Difficulty getting up from the floor because has a hard time kneeling due to knee pain.   Fell 3 times in the last 6 months (once falling off a stool and 2x tripping on her foot, does not remember which foot she tripped with, both falls when she tripped, pt fell on her L side).  Pain has improved since onset a little over 3 months ago. Unknown method of injury. Pt noticed swelling in L lateral knee before her falls.  Pt also states feeling pain in her neck after working on the computer or cooking.  Patient Stated Goals  Want to be able to get stronger and be able to get up from the floor.     Currently in Pain?  No/denies    Pain Score  0-No pain    Pain Onset  More than a month ago                               PT Education - 12/15/18 1039    Education Details  ther-ex    [employer]) Educated  Patient    Methods  Explanation;Demonstration;Tactile cues;Verbal cues    Comprehension  Returned demonstration;Verbalized understanding      Objectives  No latex band allergies   MedbridgeAccess Code: [email or web address]   Therapeutic exercise   Seated knee extension 3 lbs   R 10x2, then 5x  L 10x2 then 5x  Standing hip abduction 3 lbs with B UE assist   R 5x3  L 5x3  More difficult compared to R side  Return to 2 lbs next visit if appropriate   standing leg press resisting double blue band with B UE assist              L LE10x2             R LE 10x2  Seated hip ER resisting 2  lbs R 10x2 L 10x2  Standing hip machine height 2  Hip extension    R: plate 25 for 41Y6   L plate 25 for 06T0  Seated manually resisted L knee flexion targeting the medial hamstrings  10x2. L hamstring cramp afterwards which eased with hamstring stretch  Treadmill bar push-ups 10x2  Standing B shoulder extension resisting yellow band 10x2 with B scapular retraction to promote ability to pull herself up from the floor    Improved exercise technique, movement at target joints, use of target muscles after mod verbal, visual, tactile cues.   Continued working on improving LE strength to promote femoral control to help decrease knee pain as well as improve ability to perform floor to stand transfers.Also worked on UE strengthening to promote ability to pull and push herself up for floor to stand transfers. Pt tolerated session well without aggravation of symptoms. Pt will benefit from continued skilled physical therapy services to decrease pain and improve function.             PT Short Term Goals - 11/29/18 1451      PT SHORT TERM GOAL #1   Title  Patient will be independent with her HEP to improve strength, decrease L knee pain, improve function.     Time  3    Period  Weeks    Status  New    Target Date  12/22/18        PT Long Term Goals - 11/29/18 1451      PT LONG TERM GOAL #1   Title  Patient will have a decrease in L knee pain to 3/10 or less at worst to promote ability to perform standing tasks such as stair negotiation more comfortably.     Baseline  7/10 L knee pain at worst for the past 3 months (11/29/2018)    Time  8    Period  Weeks    Status  New    Target Date  01/26/19      PT LONG TERM GOAL #2   Title  Patient will improve bilateral LE strength by at least 1/2  MMT grade to promote ability to perform functional tasks such as stair negotiation as well as floor to stand transfers.     Time  6    Period  Weeks    Status   New    Target Date  01/26/19      PT LONG TERM GOAL #3   Title  Patient will be able to perform floor to stand transfer with mod independent to promote ability to get up from the floor if patient falls (pt goal).     Baseline  Pt needs assist to get up from the floor after a fall per subjective reports (11/29/2018).     Time  8    Period  Weeks    Status  New    Target Date  01/26/19      PT LONG TERM GOAL #4   Title  Patient will improve her DGI score to at least 19/24 as a demonstration of decreased fall risk.     Baseline  15/24 (11/29/2018)    Time  8    Period  Weeks    Status  New    Target Date  01/26/19            Plan - 12/15/18 1039    Clinical Impression Statement  Continued working on improving LE strength to promote femoral control to help decrease knee pain as well as improve ability to perform floor to stand transfers.Also worked on UE strengthening to promote ability to pull and push herself up for floor to stand transfers. Pt tolerated session well without aggravation of symptoms. Pt will benefit from continued skilled physical therapy services to decrease pain and improve function.     Rehab Potential  Good    Clinical Impairments Affecting Rehab Potential  (-) L knee pain, LE weakness, hx of falls; (+) motivated    PT Frequency  2x / week    PT Duration  8 weeks    PT Treatment/Interventions  Aquatic Therapy;Electrical Stimulation;Iontophoresis 4mg /ml Dexamethasone;Gait training;Stair training;Functional mobility training;Therapeutic activities;Therapeutic exercise;Balance training;Neuromuscular re-education;Patient/family education;Manual techniques   electrical stimulation related treatment and ultrasound if appropriate   PT Next Visit Plan  glute, trunk, scapular strengthening, femoral control, balance training, manual techniques, modalities PRN    Consulted and Agree with Plan of Care  Patient       Patient will benefit from skilled therapeutic intervention  in order to improve the following deficits and impairments:  Postural dysfunction, Abnormal gait, Decreased balance, Decreased range of motion, Decreased strength, Improper body mechanics  Visit Diagnosis: Left knee pain, unspecified chronicity  Muscle weakness (generalized)  History of falling     Problem List Patient Active Problem List   Diagnosis Date Noted  . Itching 08/10/2018  . Varicose veins of bilateral lower extremities with pain 03/30/2018  . Lymphedema 03/30/2018  . Dizziness 02/02/2017  . Situational depression 12/18/2016  . Health maintenance examination 10/23/2016  . Fatigue 08/12/2016  . Essential hypertension 12/17/2015  . Dermatitis of external ear 12/17/2015  . Corn of foot 05/16/2015  . Advanced care planning/counseling discussion 10/30/2014  . Knee pain 08/17/2013  . Oropharyngeal dysphagia 11/15/2012  . Difficulty with family 03/30/2012  . Medicare annual wellness visit, subsequent 03/30/2012  . History of basal cell carcinoma   . Osteoarthritis   . Osteopenia   . Hypothyroidism   . Glaucoma   . Chronic venous insufficiency     Joneen Boers PT, DPT   12/15/2018, 3:12 PM  [facility] [address] [place], [address] Phone: [phone]   Fax:  [phone]  Name: DEVON KINGDON MRN: [id number] Date of Birth: April 06, 1935

## 2018-12-15 NOTE — Patient Instructions (Addendum)
MedbridgeAccess Code: [email or web address]  Seated Long Arc Quad with Ankle Weight  3 lbs  10x2 each LE

## 2018-12-19 ENCOUNTER — Ambulatory Visit: Payer: Medicare Other

## 2018-12-19 DIAGNOSIS — M6281 Muscle weakness (generalized): Secondary | ICD-10-CM

## 2018-12-19 DIAGNOSIS — M25562 Pain in left knee: Secondary | ICD-10-CM

## 2018-12-19 DIAGNOSIS — Z9181 History of falling: Secondary | ICD-10-CM | POA: Diagnosis not present

## 2018-12-19 NOTE — Therapy (Signed)
[facility] [address] [place], [address] Phone: [phone]   Fax:  [phone]  Physical Therapy Treatment  Patient Details  Name: Beth Rodgers MRN: [id number] Date of Birth: [birth year] Referring Provider (PT): Vickki Hearing, MD   Encounter Date: 12/19/2018  PT End of Session - 12/19/18 1124    Visit Number  5    Number of Visits  17    Date for PT Re-Evaluation  01/26/19    Authorization Type  5    Authorization Time Period  of 10 progress report    PT Start Time  1124    PT Stop Time  1208    PT Time Calculation (min)  44 min    Equipment Utilized During Treatment  Gait belt    Activity Tolerance  Patient tolerated treatment well    Behavior During Therapy  [facility] for tasks assessed/performed       Past Medical History:  Diagnosis Date  . Cellulitis 08/10/2018  . Chronic venous insufficiency    with varicose veins  . Glaucoma   . History  of basal cell carcinoma    left nare  . History of chicken pox   . Hypothyroid   . Osteoarthritis    back pain, L knee pain  . Osteopenia 06/2009   DEXA 11/2015: T -2.3 hip, -2.2 spine, on longterm bisphosphonate/evista  . Pyogenic granuloma 04/16/2016    Past Surgical History:  Procedure Laterality Date  . BREAST BIOPSY     core  . CATARACT EXTRACTION     bilateral  . DEXA  06/2009   T score Spine: -1.8, hip -2.0  . Foam sclerotherapy Bilateral 09/2015   [place], [place]  . NASAL RECONSTRUCTION  2005   for BCC L nare s/p Mohs  . nuclear stress test  2014   normal in [place]  . RADIOFREQUENCY ABLATION Left 01/2012   remnant L G saphenous vein below knee  . RADIOFREQUENCY ABLATION Right 02/2013   RLE vein ablation   . VEIN LIGATION AND STRIPPING  remote   bilateral G saphenous veins    There were no vitals filed for this visit.  Subjective Assessment - 12/19/18 1125    Subjective  L knee feels ok. No pain.  Less inflamed. Has done a  lot of shopping and her L knee did not bother her.     Pertinent History  Pt states that she has fallen 2x in the past year, once when she was sitting on a stool, the other time pt tripped on her R foot. Has a difficult time getting up due to weakness.  Nervous about being by herself if she fell. Has been doing her HEP daily.  Pt wears an inflatable machine for her legs.  Pt thinks her cellulitis is gone.  Feels pain lateral knee. Difficulty getting up from the floor because has a hard time kneeling due to knee pain.   Fell 3 times in the last 6 months (once falling off a stool and 2x tripping on her foot, does not remember which foot she tripped with, both falls when she tripped, pt fell on her L side).  Pain has improved since onset a little over 3 months ago. Unknown method of injury. Pt noticed swelling in L lateral knee before her falls.  Pt also states feeling pain in her neck after working on the computer or cooking.     Patient Stated Goals  Want to be  able to get stronger and be able to get up from the floor.     Currently in Pain?  No/denies    Pain Score  0-No pain    Pain Onset  More than a month ago                               PT Education - 12/19/18 1129    Education Details  ther-ex    Person(s) Educated  Patient    Methods  Explanation;Demonstration;Tactile cues;Verbal cues    Comprehension  Returned demonstration;Verbalized understanding      Objectives  No latex band allergies   MedbridgeAccess Code: [email or web address]   Therapeutic exercise   Sit <> stand from regular chair with arms with emphasis on femoral control   5x  Seated knee extension 3 lbs              R 10x2, then 7x             L 10x2 then 7x  Seated hip ER resisting 2 lbs R 10x3 L 10x3  Standing hip abduction 2 lbs with B UE assist              R 10x2             L 10x2    Standing hip machine height 2             Hip extension                           R: plate 25 for 53I1                         L plate 25 for 44R1  Treadmill bar push-ups 10x2, then 5x   standing leg press resisting double blue band with B UE assist  L LE10x3 R LE 10x3   Standing B shoulder extension resisting yellow band 10x3 with B scapular retraction to promote ability to pull herself up from the floor  Standing ankle DF/PF on rockerboard with B UE assist 2 min to promote LE mobility   Improved exercise technique, movement at target joints, use of target muscles after min to mod verbal, visual, tactile cues.    Pt continues to have no starting L knee pain as well as demonstrates improving L knee pain based on subjective reports of L knee being fine after performing a lot of shopping the other day.  Continued working on femoral control, glute med strength to help continue to decrease pain as well as continued working on LE and UE strengthening to promote ability to perform floor to stand transfers. Pt tolerated session well without aggravation of symptom. Pt will benefit from continued skilled physical therapy services to decrease pain, improve strength and function.       PT Short Term Goals - 11/29/18 1451      PT SHORT TERM GOAL #1   Title  Patient will be independent with her HEP to improve strength, decrease L knee pain, improve function.     Time  3    Period  Weeks    Status  New    Target Date  12/22/18        PT Long Term Goals - 11/29/18 1451      PT LONG TERM GOAL #1   Title  Patient will have a decrease  in L knee pain to 3/10 or less at worst to promote ability to perform standing tasks such as stair negotiation more comfortably.     Baseline  7/10 L knee pain at worst for the past 3 months (11/29/2018)    Time  8    Period  Weeks    Status  New    Target Date  01/26/19      PT LONG TERM GOAL #2   Title  Patient will improve bilateral LE strength by at least 1/2 MMT grade to promote ability to  perform functional tasks such as stair negotiation as well as floor to stand transfers.     Time  6    Period  Weeks    Status  New    Target Date  01/26/19      PT LONG TERM GOAL #3   Title  Patient will be able to perform floor to stand transfer with mod independent to promote ability to get up from the floor if patient falls (pt goal).     Baseline  Pt needs assist to get up from the floor after a fall per subjective reports (11/29/2018).     Time  8    Period  Weeks    Status  New    Target Date  01/26/19      PT LONG TERM GOAL #4   Title  Patient will improve her DGI score to at least 19/24 as a demonstration of decreased fall risk.     Baseline  15/24 (11/29/2018)    Time  8    Period  Weeks    Status  New    Target Date  01/26/19            Plan - 12/19/18 1132    Clinical Impression Statement  Pt continues to have no starting L knee pain as well as demonstrates improving L knee pain based on subjective reports of L knee being fine after performing a lot of shopping the other day.  Continued working on femoral control, glute med strength to help continue to decrease pain as well as continued working on LE and UE strengthening to promote ability to perform floor to stand transfers. Pt tolerated session well without aggravation of symptom. Pt will benefit from continued skilled physical therapy services to decrease pain, improve strength and function.     Rehab Potential  Good    Clinical Impairments Affecting Rehab Potential  (-) L knee pain, LE weakness, hx of falls; (+) motivated    PT Frequency  2x / week    PT Duration  8 weeks    PT Treatment/Interventions  Aquatic Therapy;Electrical Stimulation;Iontophoresis 4mg /ml Dexamethasone;Gait training;Stair training;Functional mobility training;Therapeutic activities;Therapeutic exercise;Balance training;Neuromuscular re-education;Patient/family education;Manual techniques   electrical stimulation related treatment and ultrasound  if appropriate   PT Next Visit Plan  glute, trunk, scapular strengthening, femoral control, balance training, manual techniques, modalities PRN    Consulted and Agree with Plan of Care  Patient       Patient will benefit from skilled therapeutic intervention in order to improve the following deficits and impairments:  Postural dysfunction, Abnormal gait, Decreased balance, Decreased range of motion, Decreased strength, Improper body mechanics  Visit Diagnosis: Left knee pain, unspecified chronicity  Muscle weakness (generalized)  History of falling     Problem List Patient Active Problem List   Diagnosis Date Noted  . Itching 08/10/2018  . Varicose veins of bilateral lower extremities with pain 03/30/2018  . Lymphedema  03/30/2018  . Dizziness 02/02/2017  . Situational depression 12/18/2016  . Health maintenance examination 10/23/2016  . Fatigue 08/12/2016  . Essential hypertension 12/17/2015  . Dermatitis of external ear 12/17/2015  . Corn of foot 05/16/2015  . Advanced care planning/counseling discussion 10/30/2014  . Knee pain 08/17/2013  . Oropharyngeal dysphagia 11/15/2012  . Difficulty with family 03/30/2012  . Medicare annual wellness visit, subsequent 03/30/2012  . History of basal cell carcinoma   . Osteoarthritis   . Osteopenia   . Hypothyroidism   . Glaucoma   . Chronic venous insufficiency     Joneen Boers PT, DPT   12/19/2018, 12:42 PM  [facility] [address], [place], [address] Phone: [phone]   Fax:  [phone]  Name: Beth Rodgers MRN: [id number] Date of Birth: 1935-12-23

## 2018-12-22 ENCOUNTER — Ambulatory Visit: Payer: Medicare Other

## 2018-12-22 DIAGNOSIS — M25562 Pain in left knee: Secondary | ICD-10-CM | POA: Diagnosis not present

## 2018-12-22 DIAGNOSIS — Z9181 History of falling: Secondary | ICD-10-CM | POA: Diagnosis not present

## 2018-12-22 DIAGNOSIS — M6281 Muscle weakness (generalized): Secondary | ICD-10-CM | POA: Diagnosis not present

## 2018-12-22 NOTE — Therapy (Signed)
[facility] [address], [place], [address] Phone: [phone]   Fax:  [phone]  Physical Therapy Treatment  Patient Details  Name: Beth Rodgers MRN: [id number] Date of Birth: 1935/03/25 Referring Provider (PT): Vickki Hearing, MD   Encounter Date: 12/22/2018  PT End of Session - 12/22/18 1021    Visit Number  6    Number of Visits  17    Date for PT Re-Evaluation  01/26/19    Authorization Type  6    Authorization Time Period  of 10 progress report    PT Start Time  1021    PT Stop Time  1107    PT Time Calculation (min)  46 min    Equipment Utilized During Treatment  Gait belt    Activity Tolerance  Patient tolerated treatment well    Behavior During Therapy  [facility] for tasks assessed/performed       Past Medical History:  Diagnosis Date  . Cellulitis 08/10/2018  . Chronic venous insufficiency    with varicose veins  . Glaucoma   . History  of basal cell carcinoma    left nare  . History of chicken pox   . Hypothyroid   . Osteoarthritis    back pain, L knee pain  . Osteopenia 06/2009   DEXA 11/2015: T -2.3 hip, -2.2 spine, on longterm bisphosphonate/evista  . Pyogenic granuloma 04/16/2016    Past Surgical History:  Procedure Laterality Date  . BREAST BIOPSY     core  . CATARACT EXTRACTION     bilateral  . DEXA  06/2009   T score Spine: -1.8, hip -2.0  . Foam sclerotherapy Bilateral 09/2015   [place], [place]  . NASAL RECONSTRUCTION  2005   for BCC L nare s/p Mohs  . nuclear stress test  2014   normal in [place]  . RADIOFREQUENCY ABLATION Left 01/2012   remnant L G saphenous vein below knee  . RADIOFREQUENCY ABLATION Right 02/2013   RLE vein ablation   . VEIN LIGATION AND STRIPPING  remote   bilateral G saphenous veins    There were no vitals filed for this visit.  Subjective Assessment - 12/22/18 1023    Subjective  L knee is not bad. Has been on her feet a lot and it  does not hurt unless she presses it.     Pertinent History  Pt states that she has fallen 2x in the past year, once when she was sitting on a stool, the other time pt tripped on her R foot. Has a difficult time getting up due to weakness.  Nervous about being by herself if she fell. Has been doing her HEP daily.  Pt wears an inflatable machine for her legs.  Pt thinks her cellulitis is gone.  Feels pain lateral knee. Difficulty getting up from the floor because has a hard time kneeling due to knee pain.   Fell 3 times in the last 6 months (once falling off a stool and 2x tripping on her foot, does not remember which foot she tripped with, both falls when she tripped, pt fell on her L side).  Pain has improved since onset a little over 3 months ago. Unknown method of injury. Pt noticed swelling in L lateral knee before her falls.  Pt also states feeling pain in her neck after working on the computer or cooking.     Patient Stated Goals  Want to be able to  get stronger and be able to get up from the floor.     Currently in Pain?  No/denies    Pain Score  0-No pain    Pain Onset  More than a month ago                               PT Education - 12/22/18 1028    Education Details  ther-ex, HEP    Person(s) Educated  Patient    Methods  Explanation;Demonstration;Tactile cues;Verbal cues;Handout    Comprehension  Returned demonstration;Verbalized understanding      Objectives  No latex band allergies   MedbridgeAccess Code: [email or web address]   Therapeutic exercise    Seated knee extension 3 lbs  R 10x3 L 10x3  Seated hip ER resisting 2 lbs R 10x3 L 10x3   Standing hip abduction 2 lbs with B UE assist  R 10x2 L 10x2    standing leg press resisting double blue band with B UE assist  L LE10x3 R LE 10x3   Sit <> stand from regular chair with arms with emphasis on  femoral control              5x  Leg press machine: seat 2  Plate 25 for 99M4. Slight low back discomfort which eases with rest.   L knee discomfort with walking after exercise    Standing B shoulder extension resisting yellow band 10x3with B scapular retraction to promote ability to pull herself up from the floor   Reviewed HEP   Treadmill bar push-ups 10x3   Standing hip machine height 2 Hip extension  R: plate 25 for 26S3 L plate 25 for 41D6    Improved exercise technique, movement at target joints, use of target muscles after min to mod verbal, visual, tactile cues.   Pt overall improving with L knee pain based on subjective reports. Continued working on B LE strengthening to promote femoral control and decrease L knee pain as well as UE strengthening to promote ability to perform floor to stand transfers. Pt will benefit from continued skilled physical therapy services to decrease pain, improve strength and function.     PT Short Term Goals - 11/29/18 1451      PT SHORT TERM GOAL #1   Title  Patient will be independent with her HEP to improve strength, decrease L knee pain, improve function.     Time  3    Period  Weeks    Status  New    Target Date  12/22/18        PT Long Term Goals - 11/29/18 1451      PT LONG TERM GOAL #1   Title  Patient will have a decrease in L knee pain to 3/10 or less at worst to promote ability to perform standing tasks such as stair negotiation more comfortably.     Baseline  7/10 L knee pain at worst for the past 3 months (11/29/2018)    Time  8    Period  Weeks    Status  New    Target Date  01/26/19      PT LONG TERM GOAL #2   Title  Patient will improve bilateral LE strength by at least 1/2 MMT grade to promote ability to perform functional tasks such as stair negotiation as well as floor to stand transfers.     Time  6    Period  Weeks  Status  New     Target Date  01/26/19      PT LONG TERM GOAL #3   Title  Patient will be able to perform floor to stand transfer with mod independent to promote ability to get up from the floor if patient falls (pt goal).     Baseline  Pt needs assist to get up from the floor after a fall per subjective reports (11/29/2018).     Time  8    Period  Weeks    Status  New    Target Date  01/26/19      PT LONG TERM GOAL #4   Title  Patient will improve her DGI score to at least 19/24 as a demonstration of decreased fall risk.     Baseline  15/24 (11/29/2018)    Time  8    Period  Weeks    Status  New    Target Date  01/26/19            Plan - 12/22/18 1030    Clinical Impression Statement  Pt overall improving with L knee pain based on subjective reports. Continued working on B LE strengthening to promote femoral control and decrease L knee pain as well as UE strengthening to promote ability to perform floor to stand transfers. Pt will benefit from continued skilled physical therapy services to decrease pain, improve strength and function.     Rehab Potential  Good    Clinical Impairments Affecting Rehab Potential  (-) L knee pain, LE weakness, hx of falls; (+) motivated    PT Frequency  2x / week    PT Duration  8 weeks    PT Treatment/Interventions  Aquatic Therapy;Electrical Stimulation;Iontophoresis 4mg /ml Dexamethasone;Gait training;Stair training;Functional mobility training;Therapeutic activities;Therapeutic exercise;Balance training;Neuromuscular re-education;Patient/family education;Manual techniques   electrical stimulation related treatment and ultrasound if appropriate   PT Next Visit Plan  glute, trunk, scapular strengthening, femoral control, balance training, manual techniques, modalities PRN    Consulted and Agree with Plan of Care  Patient       Patient will benefit from skilled therapeutic intervention in order to improve the following deficits and impairments:  Postural  dysfunction, Abnormal gait, Decreased balance, Decreased range of motion, Decreased strength, Improper body mechanics  Visit Diagnosis: Left knee pain, unspecified chronicity  Muscle weakness (generalized)  History of falling     Problem List Patient Active Problem List   Diagnosis Date Noted  . Itching 08/10/2018  . Varicose veins of bilateral lower extremities with pain 03/30/2018  . Lymphedema 03/30/2018  . Dizziness 02/02/2017  . Situational depression 12/18/2016  . Health maintenance examination 10/23/2016  . Fatigue 08/12/2016  . Essential hypertension 12/17/2015  . Dermatitis of external ear 12/17/2015  . Corn of foot 05/16/2015  . Advanced care planning/counseling discussion 10/30/2014  . Knee pain 08/17/2013  . Oropharyngeal dysphagia 11/15/2012  . Difficulty with family 03/30/2012  . Medicare annual wellness visit, subsequent 03/30/2012  . History of basal cell carcinoma   . Osteoarthritis   . Osteopenia   . Hypothyroidism   . Glaucoma   . Chronic venous insufficiency    Joneen Boers PT, DPT   12/22/2018, 11:22 AM  [facility] [address] [place], [address] Phone: [phone]   Fax:  [phone]  Name: Beth Rodgers MRN: [id number] Date of Birth: 1935/08/31

## 2018-12-22 NOTE — Patient Instructions (Signed)
   MedbridgeAccess Code: [email or web address]  Shoulder extension with resistance - Neutral    Yellow band 10x3

## 2018-12-26 ENCOUNTER — Ambulatory Visit: Payer: Medicare Other

## 2018-12-26 DIAGNOSIS — M25562 Pain in left knee: Secondary | ICD-10-CM

## 2018-12-26 DIAGNOSIS — Z9181 History of falling: Secondary | ICD-10-CM

## 2018-12-26 DIAGNOSIS — M6281 Muscle weakness (generalized): Secondary | ICD-10-CM | POA: Diagnosis not present

## 2018-12-26 NOTE — Therapy (Signed)
[facility] [address], [place], [address] Phone: [phone]   Fax:  [phone]  Physical Therapy Treatment  Patient Details  Name: Beth Rodgers MRN: [id number] Date of Birth: 08/11/1935 Referring Provider (PT): Vickki Hearing, MD   Encounter Date: 12/26/2018  PT End of Session - 12/26/18 1608    Visit Number  7    Number of Visits  17    Date for PT Re-Evaluation  01/26/19    Authorization Type  7    Authorization Time Period  of 10 progress report    PT Start Time  1608    PT Stop Time  1650    PT Time Calculation (min)  42 min    Equipment Utilized During Treatment  Gait belt    Activity Tolerance  Patient tolerated treatment well    Behavior During Therapy  [facility] for tasks assessed/performed       Past Medical History:  Diagnosis Date  . Cellulitis 08/10/2018  . Chronic venous insufficiency    with varicose veins  . Glaucoma   . History  of basal cell carcinoma    left nare  . History of chicken pox   . Hypothyroid   . Osteoarthritis    back pain, L knee pain  . Osteopenia 06/2009   DEXA 11/2015: T -2.3 hip, -2.2 spine, on longterm bisphosphonate/evista  . Pyogenic granuloma 04/16/2016    Past Surgical History:  Procedure Laterality Date  . BREAST BIOPSY     core  . CATARACT EXTRACTION     bilateral  . DEXA  06/2009   T score Spine: -1.8, hip -2.0  . Foam sclerotherapy Bilateral 09/2015   [place], [place]  . NASAL RECONSTRUCTION  2005   for BCC L nare s/p Mohs  . nuclear stress test  2014   normal in [place]  . RADIOFREQUENCY ABLATION Left 01/2012   remnant L G saphenous vein below knee  . RADIOFREQUENCY ABLATION Right 02/2013   RLE vein ablation   . VEIN LIGATION AND STRIPPING  remote   bilateral G saphenous veins    There were no vitals filed for this visit.  Subjective Assessment - 12/26/18 1609    Subjective  L knee is ok. She feels it once in a while but it does  not last. Was able to step up onto a curb with her L LE without problems.  2/10 L knee pain at most for the past 7 days.  The inflammation seems to subside.     Pertinent History  Pt states that she has fallen 2x in the past year, once when she was sitting on a stool, the other time pt tripped on her R foot. Has a difficult time getting up due to weakness.  Nervous about being by herself if she fell. Has been doing her HEP daily.  Pt wears an inflatable machine for her legs.  Pt thinks her cellulitis is gone.  Feels pain lateral knee. Difficulty getting up from the floor because has a hard time kneeling due to knee pain.   Fell 3 times in the last 6 months (once falling off a stool and 2x tripping on her foot, does not remember which foot she tripped with, both falls when she tripped, pt fell on her L side).  Pain has improved since onset a little over 3 months ago. Unknown method of injury. Pt noticed swelling in L lateral knee before her falls.  Pt  also states feeling pain in her neck after working on the computer or cooking.     Patient Stated Goals  Want to be able to get stronger and be able to get up from the floor.     Currently in Pain?  No/denies    Pain Score  0-No pain    Pain Onset  More than a month ago                               PT Education - 12/26/18 1610    Education Details  ther-ex    [employer]) Educated  Patient    Methods  Explanation;Demonstration;Tactile cues;Verbal cues    Comprehension  Verbalized understanding;Returned demonstration      Objectives  No latex band allergies   MedbridgeAccess Code: [email or web address]   Therapeutic exercise    Seated knee extension 3 lbs  R 10x3 L 10x3  Seated hip ER resisting 2 lbs R 10x3 L 10x3   Standing hip abduction2lbs with B UE assist  R10x2 L10x2  Stepping over shoe box without UE assist 2x each LE. Difficulty  clearing box  Then with one UE assist 10x each LE. Difficult. Able to clear shoe box with one UE assist  SLS with 3 finger touch assist 5x5 seconds for 2 sets each LE to promote glute med muscle strengthening  Difficult  TRX mini squats 4x5 seconds  Crepitus on 4th repetition  standing leg press resisting double blue band with B UE assist  L LE10x3 R LE 10x3  Treadmill bar push-ups 10x3   Standing B shoulder extension resisting yellow band 10x3with B scapular retraction to promote ability to pull herself up from the floor    Improved exercise technique, movement at target joints, use of target muscles after min to mod verbal, visual, tactile cues.   Pt demonstrates improved overall L knee pain and ability to step up using her L LE based on subjective reports. Continued working on glute strengthening to promote femoral control to help decrease knee pain as well as worked on overall LE and UE strengthening to promote ability to perform floor to stand transfers.        PT Short Term Goals - 11/29/18 1451      PT SHORT TERM GOAL #1   Title  Patient will be independent with her HEP to improve strength, decrease L knee pain, improve function.     Time  3    Period  Weeks    Status  New    Target Date  12/22/18        PT Long Term Goals - 11/29/18 1451      PT LONG TERM GOAL #1   Title  Patient will have a decrease in L knee pain to 3/10 or less at worst to promote ability to perform standing tasks such as stair negotiation more comfortably.     Baseline  7/10 L knee pain at worst for the past 3 months (11/29/2018)    Time  8    Period  Weeks    Status  New    Target Date  01/26/19      PT LONG TERM GOAL #2   Title  Patient will improve bilateral LE strength by at least 1/2 MMT grade to promote ability to perform functional tasks such as stair negotiation as well as floor to stand transfers.     Time  6  Period  Weeks    Status  New     Target Date  01/26/19      PT LONG TERM GOAL #3   Title  Patient will be able to perform floor to stand transfer with mod independent to promote ability to get up from the floor if patient falls (pt goal).     Baseline  Pt needs assist to get up from the floor after a fall per subjective reports (11/29/2018).     Time  8    Period  Weeks    Status  New    Target Date  01/26/19      PT LONG TERM GOAL #4   Title  Patient will improve her DGI score to at least 19/24 as a demonstration of decreased fall risk.     Baseline  15/24 (11/29/2018)    Time  8    Period  Weeks    Status  New    Target Date  01/26/19            Plan - 12/26/18 1611    Clinical Impression Statement  Pt demonstrates improved overall L knee pain and ability to step up using her L LE based on subjective reports. Continued working on glute strengthening to promote femoral control to help decrease knee pain as well as worked on overall LE and UE strengthening to promote ability to perform floor to stand transfers.     Rehab Potential  Good    Clinical Impairments Affecting Rehab Potential  (-) L knee pain, LE weakness, hx of falls; (+) motivated    PT Frequency  2x / week    PT Duration  8 weeks    PT Treatment/Interventions  Aquatic Therapy;Electrical Stimulation;Iontophoresis 4mg /ml Dexamethasone;Gait training;Stair training;Functional mobility training;Therapeutic activities;Therapeutic exercise;Balance training;Neuromuscular re-education;Patient/family education;Manual techniques   electrical stimulation related treatment and ultrasound if appropriate   PT Next Visit Plan  glute, trunk, scapular strengthening, femoral control, balance training, manual techniques, modalities PRN    Consulted and Agree with Plan of Care  Patient       Patient will benefit from skilled therapeutic intervention in order to improve the following deficits and impairments:  Postural dysfunction, Abnormal gait, Decreased balance,  Decreased range of motion, Decreased strength, Improper body mechanics  Visit Diagnosis: Left knee pain, unspecified chronicity  Muscle weakness (generalized)  History of falling     Problem List Patient Active Problem List   Diagnosis Date Noted  . Itching 08/10/2018  . Varicose veins of bilateral lower extremities with pain 03/30/2018  . Lymphedema 03/30/2018  . Dizziness 02/02/2017  . Situational depression 12/18/2016  . Health maintenance examination 10/23/2016  . Fatigue 08/12/2016  . Essential hypertension 12/17/2015  . Dermatitis of external ear 12/17/2015  . Corn of foot 05/16/2015  . Advanced care planning/counseling discussion 10/30/2014  . Knee pain 08/17/2013  . Oropharyngeal dysphagia 11/15/2012  . Difficulty with family 03/30/2012  . Medicare annual wellness visit, subsequent 03/30/2012  . History of basal cell carcinoma   . Osteoarthritis   . Osteopenia   . Hypothyroidism   . Glaucoma   . Chronic venous insufficiency     Beth Rodgers PT, DPT   12/26/2018, 5:03 PM  [facility] [address], [place], [address] Phone: [phone]   Fax:  [phone]  Name: Beth Rodgers MRN: [id number] Date of Birth: [birth year]

## 2019-01-02 ENCOUNTER — Ambulatory Visit: Payer: Medicare Other

## 2019-01-02 ENCOUNTER — Telehealth: Payer: Self-pay

## 2019-01-02 NOTE — Telephone Encounter (Signed)
No show. Called patient who said that she thought her appointment was tomorrow at 11:15 am. Today's session was rescheduled for tomorrow late afternoon.

## 2019-01-03 ENCOUNTER — Ambulatory Visit: Payer: Medicare Other | Attending: Sports Medicine

## 2019-01-03 DIAGNOSIS — M25562 Pain in left knee: Secondary | ICD-10-CM | POA: Diagnosis not present

## 2019-01-03 DIAGNOSIS — M25572 Pain in left ankle and joints of left foot: Secondary | ICD-10-CM | POA: Diagnosis not present

## 2019-01-03 DIAGNOSIS — Z9181 History of falling: Secondary | ICD-10-CM | POA: Diagnosis not present

## 2019-01-03 DIAGNOSIS — M6281 Muscle weakness (generalized): Secondary | ICD-10-CM | POA: Diagnosis not present

## 2019-01-03 NOTE — Therapy (Signed)
[facility] [address] [place], [address] Phone: [phone]   Fax:  [phone]  Physical Therapy Treatment  Patient Details  Name: Beth Rodgers MRN: [id number] Date of Birth: May 10, 1935 Referring Provider (PT): Vickki Hearing, MD   Encounter Date: 01/03/2019  PT End of Session - 01/03/19 1750    Visit Number  8    Number of Visits  17    Date for PT Re-Evaluation  01/26/19    Authorization Type  8    Authorization Time Period  of 10 progress report    PT Start Time  1750    PT Stop Time  1448    PT Time Calculation (min)  41 min    Equipment Utilized During Treatment  Gait belt    Activity Tolerance  Patient tolerated treatment well    Behavior During Therapy  [facility] for tasks assessed/performed       Past Medical History:  Diagnosis Date  . Cellulitis 08/10/2018  . Chronic venous insufficiency    with varicose veins  . Glaucoma   . History  of basal cell carcinoma    left nare  . History of chicken pox   . Hypothyroid   . Osteoarthritis    back pain, L knee pain  . Osteopenia 06/2009   DEXA 11/2015: T -2.3 hip, -2.2 spine, on longterm bisphosphonate/evista  . Pyogenic granuloma 04/16/2016    Past Surgical History:  Procedure Laterality Date  . BREAST BIOPSY     core  . CATARACT EXTRACTION     bilateral  . DEXA  06/2009   T score Spine: -1.8, hip -2.0  . Foam sclerotherapy Bilateral 09/2015   [place], [place]  . NASAL RECONSTRUCTION  2005   for BCC L nare s/p Mohs  . nuclear stress test  2014   normal in [place]  . RADIOFREQUENCY ABLATION Left 01/2012   remnant L G saphenous vein below knee  . RADIOFREQUENCY ABLATION Right 02/2013   RLE vein ablation   . VEIN LIGATION AND STRIPPING  remote   bilateral G saphenous veins    There were no vitals filed for this visit.  Subjective Assessment - 01/03/19 1752    Subjective  L knee bothered her a little bit after the New Year. Did  more than usual due to having guests over. No L knee pain today. 3/10 L knee pain at most for the past 7 days.     Pertinent History  Pt states that she has fallen 2x in the past year, once when she was sitting on a stool, the other time pt tripped on her R foot. Has a difficult time getting up due to weakness.  Nervous about being by herself if she fell. Has been doing her HEP daily.  Pt wears an inflatable machine for her legs.  Pt thinks her cellulitis is gone.  Feels pain lateral knee. Difficulty getting up from the floor because has a hard time kneeling due to knee pain.   Fell 3 times in the last 6 months (once falling off a stool and 2x tripping on her foot, does not remember which foot she tripped with, both falls when she tripped, pt fell on her L side).  Pain has improved since onset a little over 3 months ago. Unknown method of injury. Pt noticed swelling in L lateral knee before her falls.  Pt also states feeling pain in her neck after working on the computer  or cooking.     Patient Stated Goals  Want to be able to get stronger and be able to get up from the floor.     Currently in Pain?  No/denies    Pain Score  0-No pain    Pain Onset  More than a month ago                               PT Education - 01/03/19 1754    Education Details  ther-ex    Person(s) Educated  Patient    Methods  Explanation;Demonstration;Tactile cues;Verbal cues    Comprehension  Returned demonstration;Verbalized understanding        Objectives  No latex band allergies   MedbridgeAccess Code: [email or web address]  Pt states that she will be at [place] from Jan 16 to January 22/23.   Therapeutic exercise    Seated knee extension 3 lbs  R 10x3 L 10x3  Seated hip ER resisting 2 lbs R 10x3 L 10x3  Increase weight next visit if appropriate.   Standing hip abduction2lbs with B UE assist   R10x2 L10x2   Treadmill bar push-ups 10x2  standing leg press resisting double blue band with B UE assist  L LE10x3 R LE 10x3  SLS with 3 finger touch assist 5x5 seconds for 2 sets each LE to promote glute med muscle strengthening             Difficult  Stepping over 4 mini hurdles with one UE assist 10x   Standing B shoulder extension resisting yellow band 10x3with B scapular retraction to promote ability to pull herself up from the floor  Stepping over shoe box with one UE assist 10x each LE.    Improved exercise technique, movement at target joints, use of target muscles after min to mod verbal, visual, tactile cues.    Continued working on glute, quadriceps, and UE strengthening to promote ability to perform floor to stand transfers. Continued working on glute med and max strengthening to promote femoral control during standing activities to help to continue to decrease knee pain. Pt tolerated session well without aggravation of symptoms. Pt seems to be progressing well with decreased L knee pain overall based on subjective reports. Pt will benefit from continued skilled physical therapy services to decrease pain, improve strength and function.     PT Short Term Goals - 11/29/18 1451      PT SHORT TERM GOAL #1   Title  Patient will be independent with her HEP to improve strength, decrease L knee pain, improve function.     Time  3    Period  Weeks    Status  New    Target Date  12/22/18        PT Long Term Goals - 11/29/18 1451      PT LONG TERM GOAL #1   Title  Patient will have a decrease in L knee pain to 3/10 or less at worst to promote ability to perform standing tasks such as stair negotiation more comfortably.     Baseline  7/10 L knee pain at worst for the past 3 months (11/29/2018)    Time  8    Period  Weeks    Status  New    Target Date  01/26/19      PT LONG TERM GOAL #2   Title  Patient will  improve bilateral LE strength by at least 1/2  MMT grade to promote ability to perform functional tasks such as stair negotiation as well as floor to stand transfers.     Time  6    Period  Weeks    Status  New    Target Date  01/26/19      PT LONG TERM GOAL #3   Title  Patient will be able to perform floor to stand transfer with mod independent to promote ability to get up from the floor if patient falls (pt goal).     Baseline  Pt needs assist to get up from the floor after a fall per subjective reports (11/29/2018).     Time  8    Period  Weeks    Status  New    Target Date  01/26/19      PT LONG TERM GOAL #4   Title  Patient will improve her DGI score to at least 19/24 as a demonstration of decreased fall risk.     Baseline  15/24 (11/29/2018)    Time  8    Period  Weeks    Status  New    Target Date  01/26/19            Plan - 01/03/19 1755    Clinical Impression Statement  Continued working on glute, quadriceps, and UE strengthening to promote ability to perform floor to stand transfers. Continued working on glute med and max strengthening to promote femoral control during standing activities to help to continue to decrease knee pain. Pt tolerated session well without aggravation of symptoms. Pt seems to be progressing well with decreased L knee pain overall based on subjective reports. Pt will benefit from continued skilled physical therapy services to decrease pain, improve strength and function.     Rehab Potential  Good    Clinical Impairments Affecting Rehab Potential  (-) L knee pain, LE weakness, hx of falls; (+) motivated    PT Frequency  2x / week    PT Duration  8 weeks    PT Treatment/Interventions  Aquatic Therapy;Electrical Stimulation;Iontophoresis 4mg /ml Dexamethasone;Gait training;Stair training;Functional mobility training;Therapeutic activities;Therapeutic exercise;Balance training;Neuromuscular re-education;Patient/family education;Manual techniques    electrical stimulation related treatment and ultrasound if appropriate   PT Next Visit Plan  glute, trunk, scapular strengthening, femoral control, balance training, manual techniques, modalities PRN    Consulted and Agree with Plan of Care  Patient       Patient will benefit from skilled therapeutic intervention in order to improve the following deficits and impairments:  Postural dysfunction, Abnormal gait, Decreased balance, Decreased range of motion, Decreased strength, Improper body mechanics  Visit Diagnosis: Left knee pain, unspecified chronicity  Muscle weakness (generalized)  History of falling     Problem List Patient Active Problem List   Diagnosis Date Noted  . Itching 08/10/2018  . Varicose veins of bilateral lower extremities with pain 03/30/2018  . Lymphedema 03/30/2018  . Dizziness 02/02/2017  . Situational depression 12/18/2016  . Health maintenance examination 10/23/2016  . Fatigue 08/12/2016  . Essential hypertension 12/17/2015  . Dermatitis of external ear 12/17/2015  . Corn of foot 05/16/2015  . Advanced care planning/counseling discussion 10/30/2014  . Knee pain 08/17/2013  . Oropharyngeal dysphagia 11/15/2012  . Difficulty with family 03/30/2012  . Medicare annual wellness visit, subsequent 03/30/2012  . History of basal cell carcinoma   . Osteoarthritis   . Osteopenia   . Hypothyroidism   . Glaucoma   . Chronic venous insufficiency    Joneen Boers PT,  DPT   01/03/2019, 6:38 PM  [facility] [address] [place], [address] Phone: [phone]   Fax:  [phone]  Name: Beth Rodgers MRN: [id number] Date of Birth: [birth year]

## 2019-01-05 ENCOUNTER — Ambulatory Visit: Payer: Medicare Other

## 2019-01-05 DIAGNOSIS — M6281 Muscle weakness (generalized): Secondary | ICD-10-CM | POA: Diagnosis not present

## 2019-01-05 DIAGNOSIS — Z9181 History of falling: Secondary | ICD-10-CM | POA: Diagnosis not present

## 2019-01-05 DIAGNOSIS — M25562 Pain in left knee: Secondary | ICD-10-CM

## 2019-01-05 DIAGNOSIS — H401111 Primary open-angle glaucoma, right eye, mild stage: Secondary | ICD-10-CM | POA: Diagnosis not present

## 2019-01-05 DIAGNOSIS — M25572 Pain in left ankle and joints of left foot: Secondary | ICD-10-CM | POA: Diagnosis not present

## 2019-01-05 NOTE — Therapy (Signed)
[facility] [address], [place], [address] Phone: [phone]   Fax:  [phone]  Physical Therapy Treatment  Patient Details  Name: Beth Rodgers MRN: [id number] Date of Birth: 11-13-1935 Referring Provider (PT): Vickki Hearing, MD   Encounter Date: 01/05/2019  PT End of Session - 01/05/19 0904    Visit Number  9    Number of Visits  17    Date for PT Re-Evaluation  01/26/19    Authorization Type  9    Authorization Time Period  of 10 progress report    PT Start Time  0904    PT Stop Time  0945    PT Time Calculation (min)  41 min    Equipment Utilized During Treatment  Gait belt    Activity Tolerance  Patient tolerated treatment well    Behavior During Therapy  [facility] for tasks assessed/performed       Past Medical History:  Diagnosis Date  . Cellulitis 08/10/2018  . Chronic venous insufficiency    with varicose veins  . Glaucoma   . History  of basal cell carcinoma    left nare  . History of chicken pox   . Hypothyroid   . Osteoarthritis    back pain, L knee pain  . Osteopenia 06/2009   DEXA 11/2015: T -2.3 hip, -2.2 spine, on longterm bisphosphonate/evista  . Pyogenic granuloma 04/16/2016    Past Surgical History:  Procedure Laterality Date  . BREAST BIOPSY     core  . CATARACT EXTRACTION     bilateral  . DEXA  06/2009   T score Spine: -1.8, hip -2.0  . Foam sclerotherapy Bilateral 09/2015   [place], [place]  . NASAL RECONSTRUCTION  2005   for BCC L nare s/p Mohs  . nuclear stress test  2014   normal in [place]  . RADIOFREQUENCY ABLATION Left 01/2012   remnant L G saphenous vein below knee  . RADIOFREQUENCY ABLATION Right 02/2013   RLE vein ablation   . VEIN LIGATION AND STRIPPING  remote   bilateral G saphenous veins    There were no vitals filed for this visit.  Subjective Assessment - 01/05/19 0905    Subjective  L knee was sore yesterday (laterally). Today, her L knee  is better. She feels it a little bit, 1/10 currently. 4/10 L knee pain at most for the past 7 days.     Pertinent History  Pt states that she has fallen 2x in the past year, once when she was sitting on a stool, the other time pt tripped on her R foot. Has a difficult time getting up due to weakness.  Nervous about being by herself if she fell. Has been doing her HEP daily.  Pt wears an inflatable machine for her legs.  Pt thinks her cellulitis is gone.  Feels pain lateral knee. Difficulty getting up from the floor because has a hard time kneeling due to knee pain.   Fell 3 times in the last 6 months (once falling off a stool and 2x tripping on her foot, does not remember which foot she tripped with, both falls when she tripped, pt fell on her L side).  Pain has improved since onset a little over 3 months ago. Unknown method of injury. Pt noticed swelling in L lateral knee before her falls.  Pt also states feeling pain in her neck after working on the computer or cooking.  Patient Stated Goals  Want to be able to get stronger and be able to get up from the floor.     Currently in Pain?  Yes    Pain Score  1     Pain Location  Knee    Pain Orientation  Left    Pain Onset  More than a month ago         [facility] PT Assessment - 01/05/19 0908      Strength   Right Hip Flexion  4/5    Right Hip Extension  5/5   seated manually resisted hip extension   Right Hip ABduction  4-/5    Left Hip Flexion  4+/5    Left Hip Extension  4+/5   seated manually resisted hip extension   Right Knee Flexion  4+/5    Right Knee Extension  4+/5    Left Knee Flexion  4+/5    Left Knee Extension  4+/5                           PT Education - 01/05/19 0920    Education Details  ther-ex, HEP, plan of care    Person(s) Educated  Patient    Methods  Explanation;Demonstration;Tactile cues;Verbal cues    Comprehension  Returned demonstration;Verbalized understanding      Objectives  No  latex band allergies   MedbridgeAccess Code: [email or web address]  Pt states that she will be at [place] from Jan 16 to January 22/23.   TTP greater trochanter R > L  Therapeutic exercise   seated manually resisted hip flexion, hip extension, knee flexion, knee extension, S/L hip abduction 1-2x each way for each LE (except for L hip due to R lateral hip discomfort in R S/L)  Reviewed progress/current status with strength with pt.   S/L hip abduction with PT assist   R 10x   Supine clamshell isometrics at 40% effort 1 min, then 1 min rest 2x  Then in sitting 1 min   Seated trunk flexion  Increased B lateral thigh discomfort Sitting with upright posture: no change in B lateral thigh discomfort Seated posterior pelvic tilt: decreased B thigh discomfort  Seated hip adduction ball and glute max squeeze 10x5 seconds  Decreased B lateral thigh discomfort  Seated knee extension 3 lbs  R 10x3 L 10x3  Improved exercise technique, movement at target joints, use of target muscles after mod verbal, visual, tactile cues.   Pt demonstrates some improvement in B LE strength, as well as decreased L knee pain overall since initial evaluation. Pt demonstrates B TTP greater trochanter R >L today and provided glute med isometric muscle activation to help address. Pt demonstrated B lateral thigh discomfort afterwards and was provided hip adduction isometrics exercise which alleviated her discomfort. Demonstrates possible overuse of piriformis muscles. Continued working on LE strengthening as well to promote ability to perform floor to stand transfers. Pt will benefit from continued skilled physical therapy services to decrease pain, improve strength, balance, and function.                 PT Short Term Goals - 01/05/19 0932      PT SHORT TERM GOAL #1   Title  Patient will be independent with her HEP to improve strength, decrease L knee pain, improve function.      Baseline  Pt performing her exercises at home per subjective reports (01/05/2019)    Time  3  Period  Weeks    Status  New    Target Date  12/22/18        PT Long Term Goals - 01/05/19 0907      PT LONG TERM GOAL #1   Title  Patient will have a decrease in L knee pain to 3/10 or less at worst to promote ability to perform standing tasks such as stair negotiation more comfortably.     Baseline  7/10 L knee pain at worst for the past 3 months (11/29/2018); 4/10 at most for the past 7 days (01/05/2019)    Time  8    Period  Weeks    Status  Partially Met    Target Date  01/26/19      PT LONG TERM GOAL #2   Title  Patient will improve bilateral LE strength by at least 1/2 MMT grade to promote ability to perform functional tasks such as stair negotiation as well as floor to stand transfers.     Time  6    Period  Weeks    Status  Partially Met    Target Date  01/26/19      PT LONG TERM GOAL #3   Title  Patient will be able to perform floor to stand transfer with mod independent to promote ability to get up from the floor if patient falls (pt goal).     Baseline  Pt needs assist to get up from the floor after a fall per subjective reports (11/29/2018).     Time  8    Period  Weeks    Status  New      PT LONG TERM GOAL #4   Title  Patient will improve her DGI score to at least 19/24 as a demonstration of decreased fall risk.     Baseline  15/24 (11/29/2018)    Time  8    Period  Weeks    Status  New            Plan - 01/05/19 0931    Clinical Impression Statement  Pt demonstrates some improvement in B LE strength, as well as decreased L knee pain overall since initial evaluation. Pt demonstrates B TTP greater trochanter R >L today and provided glute med isometric muscle activation to help address. Pt demonstrated B lateral thigh discomfort afterwards and was provided hip adduction isometrics exercise which alleviated her discomfort. Demonstrates possible overuse of piriformis  muscles. Continued working on LE strengthening as well to promote ability to perform floor to stand transfers. Pt will benefit from continued skilled physical therapy services to decrease pain, improve strength, balance, and function.     History and Personal Factors relevant to plan of care:  L knee pain, LE weakness, hx of multiple falls, age    Rehab Potential  Good    Clinical Impairments Affecting Rehab Potential  (-) L knee pain, LE weakness, hx of falls; (+) motivated    PT Frequency  2x / week    PT Duration  8 weeks    PT Treatment/Interventions  Aquatic Therapy;Electrical Stimulation;Iontophoresis 1m/ml Dexamethasone;Gait training;Stair training;Functional mobility training;Therapeutic activities;Therapeutic exercise;Balance training;Neuromuscular re-education;Patient/family education;Manual techniques   electrical stimulation related treatment and ultrasound if appropriate   PT Next Visit Plan  glute, trunk, scapular strengthening, femoral control, balance training, manual techniques, modalities PRN    Consulted and Agree with Plan of Care  Patient       Patient will benefit from skilled therapeutic intervention in order to improve  the following deficits and impairments:  Postural dysfunction, Abnormal gait, Decreased balance, Decreased range of motion, Decreased strength, Improper body mechanics  Visit Diagnosis: Left knee pain, unspecified chronicity  Muscle weakness (generalized)  History of falling     Problem List Patient Active Problem List   Diagnosis Date Noted  . Itching 08/10/2018  . Varicose veins of bilateral lower extremities with pain 03/30/2018  . Lymphedema 03/30/2018  . Dizziness 02/02/2017  . Situational depression 12/18/2016  . Health maintenance examination 10/23/2016  . Fatigue 08/12/2016  . Essential hypertension 12/17/2015  . Dermatitis of external ear 12/17/2015  . Corn of foot 05/16/2015  . Advanced care planning/counseling discussion  10/30/2014  . Knee pain 08/17/2013  . Oropharyngeal dysphagia 11/15/2012  . Difficulty with family 03/30/2012  . Medicare annual wellness visit, subsequent 03/30/2012  . History of basal cell carcinoma   . Osteoarthritis   . Osteopenia   . Hypothyroidism   . Glaucoma   . Chronic venous insufficiency     Joneen Boers PT, DPT   01/05/2019, 6:27 PM  [facility] [address] [place], [address] Phone: [phone]   Fax:  [phone]  Name: Beth Rodgers MRN: [id number] Date of Birth: 07-11-1935

## 2019-01-05 NOTE — Patient Instructions (Addendum)
Seated clamshell isometrics   Sitting on your bed   Belt around thighs, which are shoulder width apart,    Press knees out against belt with 40 % effort for 1 minute   Rest for 1-2 minutes   Perform 5 times per set.     Do 3 sets per day. Each set to be performed a few hours apart.    MedbridgeAccess Code: [email or web address]  Seated Hip Adduction Isometrics with Ball  10x3 with 5 second holds

## 2019-01-09 ENCOUNTER — Ambulatory Visit: Payer: Medicare Other

## 2019-01-09 DIAGNOSIS — M25572 Pain in left ankle and joints of left foot: Secondary | ICD-10-CM | POA: Diagnosis not present

## 2019-01-09 DIAGNOSIS — M25562 Pain in left knee: Secondary | ICD-10-CM

## 2019-01-09 DIAGNOSIS — M6281 Muscle weakness (generalized): Secondary | ICD-10-CM | POA: Diagnosis not present

## 2019-01-09 DIAGNOSIS — Z9181 History of falling: Secondary | ICD-10-CM

## 2019-01-09 NOTE — Therapy (Signed)
[facility] [address] [place], [address] Phone: [phone]   Fax:  [phone]  Physical Therapy Treatment And Progress Report (11/29/2018 - 01/09/2019)  Patient Details  Name: Beth Rodgers MRN: [id number] Date of Birth: December 30, 1934 Referring Provider (PT): Vickki Hearing, MD   Encounter Date: 01/09/2019  PT End of Session - 01/09/19 0954    Visit Number  10    Number of Visits  17    Date for PT Re-Evaluation  01/26/19    Authorization Type  10    Authorization Time Period  of 10 progress report    PT Start Time  0954    PT Stop Time  1036    PT Time Calculation (min)  42 min    Equipment Utilized During Treatment  Gait belt    Activity Tolerance  Patient tolerated treatment well    Behavior During Therapy  [facility] for tasks assessed/performed       Past Medical History:  Diagnosis Date  . Cellulitis 08/10/2018  . Chronic venous insufficiency    with varicose veins  . Glaucoma   . History  of basal cell carcinoma    left nare  . History of chicken pox   . Hypothyroid   . Osteoarthritis    back pain, L knee pain  . Osteopenia 06/2009   DEXA 11/2015: T -2.3 hip, -2.2 spine, on longterm bisphosphonate/evista  . Pyogenic granuloma 04/16/2016    Past Surgical History:  Procedure Laterality Date  . BREAST BIOPSY     core  . CATARACT EXTRACTION     bilateral  . DEXA  06/2009   T score Spine: -1.8, hip -2.0  . Foam sclerotherapy Bilateral 09/2015   [place], [place]  . NASAL RECONSTRUCTION  2005   for BCC L nare s/p Mohs  . nuclear stress test  2014   normal in [place]  . RADIOFREQUENCY ABLATION Left 01/2012   remnant L G saphenous vein below knee  . RADIOFREQUENCY ABLATION Right 02/2013   RLE vein ablation   . VEIN LIGATION AND STRIPPING  remote   bilateral G saphenous veins    There were no vitals filed for this visit.  Subjective Assessment - 01/09/19 0956    Subjective  L knee  is not a pain that she complains about. Thinks it is going to be permanent but is better than what it was before. Pt states that her L knee cap has been tender for years.  1-2/10 L knee pain currently, 3/10 L knee pain at most for the past 7 days.   No falls since start of PT. Loses balance when her L foot gets stuck in the front of her shoe and on the floor (possible abrupt stop). Recovers by grabbing onto something.     Pertinent History  Pt states that she has fallen 2x in the past year, once when she was sitting on a stool, the other time pt tripped on her R foot. Has a difficult time getting up due to weakness.  Nervous about being by herself if she fell. Has been doing her HEP daily.  Pt wears an inflatable machine for her legs.  Pt thinks her cellulitis is gone.  Feels pain lateral knee. Difficulty getting up from the floor because has a hard time kneeling due to knee pain.   Fell 3 times in the last 6 months (once falling off a stool and 2x tripping on her foot, does  not remember which foot she tripped with, both falls when she tripped, pt fell on her L side).  Pain has improved since onset a little over 3 months ago. Unknown method of injury. Pt noticed swelling in L lateral knee before her falls.  Pt also states feeling pain in her neck after working on the computer or cooking.     Patient Stated Goals  Want to be able to get stronger and be able to get up from the floor.     Currently in Pain?  Yes    Pain Score  2     Pain Location  Knee    Pain Orientation  Left    Pain Onset  More than a month ago         [facility] PT Assessment - 01/09/19 1018      Dynamic Gait Index   Level Surface  Mild Impairment    Change in Gait Speed  Mild Impairment    Gait with Horizontal Head Turns  Mild Impairment    Gait with Vertical Head Turns  Mild Impairment    Gait and Pivot Turn  Normal    Step Over Obstacle  Mild Impairment    Step Around Obstacles  Normal    Steps  Mild Impairment    Total Score   18    DGI comment:  < 19 suggests increase in fall risk                           PT Education - 01/09/19 1001    Education Details  ther-ex    Person(s) Educated  Patient    Methods  Explanation;Demonstration;Tactile cues;Verbal cues    Comprehension  Returned demonstration;Verbalized understanding       Objectives  No latex band allergies   MedbridgeAccess Code: [email or web address]  Pt states that she will be at [place] from Jan 16 to January 22/23.  TTP greater trochanter R > L    TTP L patella, patellar tendon and L medial knee joint  Gait training  Directed patient with gait with normal gait speed, with changes in speed, 180 degree pivot turn, with R and L cervical rotation position, with cervical flexion and extension position, stepping around obstacles, stepping over an obstacle, ascending and descending 4 regular steps with UE assist   Improved technique, movement at target joints, use of target muscles after min to mod verbal, visual, tactile cues.     Therapeutic exercise   Ascending and descending 4 regular steps with B UE assist 2x, emphasis on femoral control. Difficulty with L LE femoral control   forward step up onto 4 inch step with one UE assist  R LE 10x  L LE 10x2. Max cues for femoral control.   Seated hip adduction ball and glute max squeeze 10x10 seconds to help with L patellar discomfort.   Walking on tip toes to help with balance (pt states loses balance when the front of her foot gets stuck onto the floor) 6x 5 ft  Heel walking to help with balance (pt states loses balance when the front of her foot gets stuck onto the floor) with one UE assist 7x    Improved exercise technique, movement at target joints, use of target muscles after min to mod verbal, visual, tactile cues.   Pt demonstrates overall improved balance (improved DGI score), overall improved B LE strength, and L knee pain since initial evaluation. Still  demonstrates TTP  L patella and medial joint line as well as difficulty with femoral control, LE weakness and difficulty performing tasks such as stair negotiation, stepping up onto a curb, as well as floor to stand transfers. Patient will benefit from continued skilled physical therapy services to address the aforementioned deficits.      PT Short Term Goals - 01/09/19 1044      PT SHORT TERM GOAL #1   Title  Patient will be independent with her HEP to improve strength, decrease L knee pain, improve function.     Baseline  Pt performing her exercises at home per subjective reports (01/05/2019)    Time  3    Period  Weeks    Status  On-going    Target Date  12/22/18        PT Long Term Goals - 01/09/19 1045      PT LONG TERM GOAL #1   Title  Patient will have a decrease in L knee pain to 3/10 or less at worst to promote ability to perform standing tasks such as stair negotiation more comfortably.     Baseline  7/10 L knee pain at worst for the past 3 months (11/29/2018); 4/10 at most for the past 7 days (01/05/2019); 3/10 L knee pain at most for the past 7 days (01/09/2019)    Time  8    Period  Weeks    Status  Partially Met    Target Date  01/26/19      PT LONG TERM GOAL #2   Title  Patient will improve bilateral LE strength by at least 1/2 MMT grade to promote ability to perform functional tasks such as stair negotiation as well as floor to stand transfers.     Time  6    Period  Weeks    Status  Partially Met    Target Date  01/26/19      PT LONG TERM GOAL #3   Title  Patient will be able to perform floor to stand transfer with mod independent to promote ability to get up from the floor if patient falls (pt goal).     Baseline  Pt needs assist to get up from the floor after a fall per subjective reports (11/29/2018).     Time  8    Period  Weeks    Status  Deferred    Target Date  01/26/19      PT LONG TERM GOAL #4   Title  Patient will improve her DGI score to at least 19/24  as a demonstration of decreased fall risk.     Baseline  15/24 (11/29/2018); 18/24 (01/09/2019)    Time  8    Period  Weeks    Status  Partially Met    Target Date  01/26/19            Plan - 01/09/19 1025    Clinical Impression Statement  Pt demonstrates overall improved balance (improved DGI score), overall improved B LE strength, and L knee pain since initial evaluation. Still demonstrates TTP L patella and medial joint line as well as difficulty with femoral control, LE weakness and difficulty performing tasks such as stair negotiation, stepping up onto a curb, as well as floor to stand transfers. Patient will benefit from continued skilled physical therapy services to address the aforementioned deficits.     History and Personal Factors relevant to plan of care:  L knee pain, LE weakness, hx of multiple falls, age.  Clinical Presentation  Stable    Clinical Presentation due to:  overall improving L knee pain, LE strength and balance (based on DGI)    Rehab Potential  Good    Clinical Impairments Affecting Rehab Potential  (-) L knee pain, LE weakness, hx of falls; (+) motivated    PT Frequency  2x / week    PT Duration  8 weeks    PT Treatment/Interventions  Aquatic Therapy;Electrical Stimulation;Iontophoresis '4mg'$ /ml Dexamethasone;Gait training;Stair training;Functional mobility training;Therapeutic activities;Therapeutic exercise;Balance training;Neuromuscular re-education;Patient/family education;Manual techniques   electrical stimulation related treatment and ultrasound if appropriate   PT Next Visit Plan  glute, trunk, scapular strengthening, femoral control, balance training, manual techniques, modalities PRN    Consulted and Agree with Plan of Care  Patient       Patient will benefit from skilled therapeutic intervention in order to improve the following deficits and impairments:  Postural dysfunction, Abnormal gait, Decreased balance, Decreased range of motion, Decreased  strength, Improper body mechanics  Visit Diagnosis: Left knee pain, unspecified chronicity  Muscle weakness (generalized)  History of falling     Problem List Patient Active Problem List   Diagnosis Date Noted  . Itching 08/10/2018  . Varicose veins of bilateral lower extremities with pain 03/30/2018  . Lymphedema 03/30/2018  . Dizziness 02/02/2017  . Situational depression 12/18/2016  . Health maintenance examination 10/23/2016  . Fatigue 08/12/2016  . Essential hypertension 12/17/2015  . Dermatitis of external ear 12/17/2015  . Corn of foot 05/16/2015  . Advanced care planning/counseling discussion 10/30/2014  . Knee pain 08/17/2013  . Oropharyngeal dysphagia 11/15/2012  . Difficulty with family 03/30/2012  . Medicare annual wellness visit, subsequent 03/30/2012  . History of basal cell carcinoma   . Osteoarthritis   . Osteopenia   . Hypothyroidism   . Glaucoma   . Chronic venous insufficiency     Thank you for your referral.  Joneen Boers PT, DPT   01/09/2019, 11:00 AM  [facility] [address] [place], [address] Phone: [phone]   Fax:  [phone]  Name: Beth Rodgers MRN: [id number] Date of Birth: 1935-04-21

## 2019-01-11 ENCOUNTER — Ambulatory Visit: Payer: Medicare Other

## 2019-01-11 DIAGNOSIS — M25562 Pain in left knee: Secondary | ICD-10-CM | POA: Diagnosis not present

## 2019-01-11 DIAGNOSIS — Z9181 History of falling: Secondary | ICD-10-CM

## 2019-01-11 DIAGNOSIS — M6281 Muscle weakness (generalized): Secondary | ICD-10-CM | POA: Diagnosis not present

## 2019-01-11 DIAGNOSIS — M25572 Pain in left ankle and joints of left foot: Secondary | ICD-10-CM | POA: Diagnosis not present

## 2019-01-11 NOTE — Therapy (Signed)
[facility] [address] [place], [address] Phone: [phone]   Fax:  [phone]  Physical Therapy Treatment  Patient Details  Name: Beth Rodgers MRN: [id number] Date of Birth: September 24, 1935 Referring Provider (PT): Vickki Hearing, MD   Encounter Date: 01/11/2019  PT End of Session - 01/11/19 1051    Visit Number  11    Number of Visits  17    Date for PT Re-Evaluation  01/26/19    Authorization Type  1    Authorization Time Period  of 10 progress report    PT Start Time  1051    PT Stop Time  1133    PT Time Calculation (min)  42 min    Equipment Utilized During Treatment  Gait belt    Activity Tolerance  Patient tolerated treatment well    Behavior During Therapy  WFL for tasks assessed/performed       Past Medical History:  Diagnosis Date  . Cellulitis 08/10/2018  . Chronic venous insufficiency    with varicose veins  . Glaucoma   . History  of basal cell carcinoma    left nare  . History of chicken pox   . Hypothyroid   . Osteoarthritis    back pain, L knee pain  . Osteopenia 06/2009   DEXA 11/2015: T -2.3 hip, -2.2 spine, on longterm bisphosphonate/evista  . Pyogenic granuloma 04/16/2016    Past Surgical History:  Procedure Laterality Date  . BREAST BIOPSY     core  . CATARACT EXTRACTION     bilateral  . DEXA  06/2009   T score Spine: -1.8, hip -2.0  . Foam sclerotherapy Bilateral 09/2015   [facility] [place], [place]  . NASAL RECONSTRUCTION  2005   for BCC L nare s/p Mohs  . nuclear stress test  2014   normal in [place]  . RADIOFREQUENCY ABLATION Left 01/2012   remnant L G saphenous vein below knee  . RADIOFREQUENCY ABLATION Right 02/2013   RLE vein ablation   . VEIN LIGATION AND STRIPPING  remote   bilateral G saphenous veins    There were no vitals filed for this visit.  Subjective Assessment - 01/11/19 1052    Subjective  Did a lot of standing activities yesterday. Only sat  down twice. Wants to take an easy with her L knee today. Felt like needles L knee.  Leaving for [place] today.  4/10 L knee pain currently. 6-7/10 yesterday at the middle of the night.  The lateral knee pain is better.  L knee was better before yesterday.     Pertinent History  Pt states that she has fallen 2x in the past year, once when she was sitting on a stool, the other time pt tripped on her R foot. Has a difficult time getting up due to weakness.  Nervous about being by herself if she fell. Has been doing her HEP daily.  Pt wears an inflatable machine for her legs.  Pt thinks her cellulitis is gone.  Feels pain lateral knee. Difficulty getting up from the floor because has a hard time kneeling due to knee pain.   Fell 3 times in the last 6 months (once falling off a stool and 2x tripping on her foot, does not remember which foot she tripped with, both falls when she tripped, pt fell on her L side).  Pain has improved since onset a little over 3 months ago. Unknown method of injury.  Pt noticed swelling in L lateral knee before her falls.  Pt also states feeling pain in her neck after working on the computer or cooking.     Patient Stated Goals  Want to be able to get stronger and be able to get up from the floor.     Currently in Pain?  Yes    Pain Score  4     Pain Onset  More than a month ago                               PT Education - 01/11/19 1110    Education Details  ther-ex    Person(s) Educated  Patient    Methods  Explanation;Demonstration;Tactile cues;Verbal cues    Comprehension  Returned demonstration;Verbalized understanding        Objectives  No latex band allergies   MedbridgeAccess Code: [email or web address]  Pt states that she will be at [place] from Jan 16 to January 22/23.  TTP greater trochanter R > L  Manual therapy  Supine gentle manual IR of L tibia grade 1-2 for pain control   Decreased L knee pain afterwards.   Therapeutic  exercise    Seated knee extension 3 lbs  R 10x2 L 10x, 8x, difficult today for L LE.   standing L hip abduction 2 lbs 10x3 with  B UE assist  Standing L LE leg press with B UE assist, resisting double blue band   Standing B shoulder extension resisting red (upgrade) band 5x3 to promote ability to pull up with floor to stand transfers.   Push-ups at treadmill bar 10x2 to promote ability to push up with floor to stand transfers.   Standing hip machine height 3  L hip extension plate 25 for 73Z3. Some discomfort posterior thigh due to pad pressure on her veins.   Seated hip adduction ball and glute max squeeze 10x5 seconds for 2 sets  Standing heel walking with one UE assist 5 ft x 3  Improved exercise technique, movement at target joints, use of target muscles after min to mod verbal, visual, tactile cues.   Performed gentle manual therapy for L knee for pain control which helped alleviate her L knee pain. Continued working on open chain L LE strengthening to not place too much pressure on L knee joint as well as UE strengthening to help her with floor to stand transfers. No L knee pain with walking after session. Patient will benefit from continued skilled physical therapy services to decrease L knee pain, improve strength and function.     PT Short Term Goals - 01/09/19 1044      PT SHORT TERM GOAL #1   Title  Patient will be independent with her HEP to improve strength, decrease L knee pain, improve function.     Baseline  Pt performing her exercises at home per subjective reports (01/05/2019)    Time  3    Period  Weeks    Status  On-going    Target Date  12/22/18        PT Long Term Goals - 01/09/19 1045      PT LONG TERM GOAL #1   Title  Patient will have a decrease in L knee pain to 3/10 or less at worst to promote ability to perform standing tasks such as stair negotiation more comfortably.     Baseline  7/10 L knee pain at worst for the  past  3 months (11/29/2018); 4/10 at most for the past 7 days (01/05/2019); 3/10 L knee pain at most for the past 7 days (01/09/2019)    Time  8    Period  Weeks    Status  Partially Met    Target Date  01/26/19      PT LONG TERM GOAL #2   Title  Patient will improve bilateral LE strength by at least 1/2 MMT grade to promote ability to perform functional tasks such as stair negotiation as well as floor to stand transfers.     Time  6    Period  Weeks    Status  Partially Met    Target Date  01/26/19      PT LONG TERM GOAL #3   Title  Patient will be able to perform floor to stand transfer with mod independent to promote ability to get up from the floor if patient falls (pt goal).     Baseline  Pt needs assist to get up from the floor after a fall per subjective reports (11/29/2018).     Time  8    Period  Weeks    Status  Deferred    Target Date  01/26/19      PT LONG TERM GOAL #4   Title  Patient will improve her DGI score to at least 19/24 as a demonstration of decreased fall risk.     Baseline  15/24 (11/29/2018); 18/24 (01/09/2019)    Time  8    Period  Weeks    Status  Partially Met    Target Date  01/26/19            Plan - 01/11/19 1050    Clinical Impression Statement  Performed gentle manual therapy for L knee for pain control which helped alleviate her L knee pain. Continued working on open chain L LE strengthening to not place too much pressure on L knee joint as well as UE strengthening to help her with floor to stand transfers. No L knee pain with walking after session. Patient will benefit from continued skilled physical therapy services to decrease L knee pain, improve strength and function.     Rehab Potential  Good    Clinical Impairments Affecting Rehab Potential  (-) L knee pain, LE weakness, hx of falls; (+) motivated    PT Frequency  2x / week    PT Duration  8 weeks    PT Treatment/Interventions  Aquatic Therapy;Electrical Stimulation;Iontophoresis '4mg'$ /ml  Dexamethasone;Gait training;Stair training;Functional mobility training;Therapeutic activities;Therapeutic exercise;Balance training;Neuromuscular re-education;Patient/family education;Manual techniques   electrical stimulation related treatment and ultrasound if appropriate   PT Next Visit Plan  glute, trunk, scapular strengthening, femoral control, balance training, manual techniques, modalities PRN    Consulted and Agree with Plan of Care  Patient       Patient will benefit from skilled therapeutic intervention in order to improve the following deficits and impairments:  Postural dysfunction, Abnormal gait, Decreased balance, Decreased range of motion, Decreased strength, Improper body mechanics  Visit Diagnosis: Left knee pain, unspecified chronicity  Muscle weakness (generalized)  History of falling     Problem List Patient Active Problem List   Diagnosis Date Noted  . Itching 08/10/2018  . Varicose veins of bilateral lower extremities with pain 03/30/2018  . Lymphedema 03/30/2018  . Dizziness 02/02/2017  . Situational depression 12/18/2016  . Health maintenance examination 10/23/2016  . Fatigue 08/12/2016  . Essential hypertension 12/17/2015  . Dermatitis of external  ear 12/17/2015  . Corn of foot 05/16/2015  . Advanced care planning/counseling discussion 10/30/2014  . Knee pain 08/17/2013  . Oropharyngeal dysphagia 11/15/2012  . Difficulty with family 03/30/2012  . Medicare annual wellness visit, subsequent 03/30/2012  . History of basal cell carcinoma   . Osteoarthritis   . Osteopenia   . Hypothyroidism   . Glaucoma   . Chronic venous insufficiency     Joneen Boers PT, DPT   01/11/2019, 11:43 AM  [facility] [address] [place], [address] Phone: [phone]   Fax:  [phone]  Name: Beth Rodgers MRN: [id number] Date of Birth: 03/11/1935

## 2019-01-19 ENCOUNTER — Ambulatory Visit: Payer: Medicare Other

## 2019-01-23 ENCOUNTER — Ambulatory Visit: Payer: Medicare Other

## 2019-01-23 DIAGNOSIS — M6281 Muscle weakness (generalized): Secondary | ICD-10-CM | POA: Diagnosis not present

## 2019-01-23 DIAGNOSIS — M25572 Pain in left ankle and joints of left foot: Secondary | ICD-10-CM | POA: Diagnosis not present

## 2019-01-23 DIAGNOSIS — M25562 Pain in left knee: Secondary | ICD-10-CM

## 2019-01-23 DIAGNOSIS — Z9181 History of falling: Secondary | ICD-10-CM

## 2019-01-23 NOTE — Therapy (Signed)
[facility] [address], [place], [address] Phone: [phone]   Fax:  [phone]  Physical Therapy Treatment  Patient Details  Name: Beth Rodgers MRN: [id number] Date of Birth: 06/24/1935 Referring Provider (PT): Vickki Hearing, MD   Encounter Date: 01/23/2019  PT End of Session - 01/23/19 1524    Visit Number  12    Number of Visits  17    Date for PT Re-Evaluation  01/26/19    Authorization Type  2    Authorization Time Period  of 10 progress report    PT Start Time  1524    PT Stop Time  1607    PT Time Calculation (min)  43 min    Equipment Utilized During Treatment  Gait belt    Activity Tolerance  Patient tolerated treatment well    Behavior During Therapy  WFL for tasks assessed/performed       Past Medical History:  Diagnosis Date  . Cellulitis 08/10/2018  . Chronic venous insufficiency    with varicose veins  . Glaucoma   . History  of basal cell carcinoma    left nare  . History of chicken pox   . Hypothyroid   . Osteoarthritis    back pain, L knee pain  . Osteopenia 06/2009   DEXA 11/2015: T -2.3 hip, -2.2 spine, on longterm bisphosphonate/evista  . Pyogenic granuloma 04/16/2016    Past Surgical History:  Procedure Laterality Date  . BREAST BIOPSY     core  . CATARACT EXTRACTION     bilateral  . DEXA  06/2009   T score Spine: -1.8, hip -2.0  . Foam sclerotherapy Bilateral 09/2015   [place], [place]  . NASAL RECONSTRUCTION  2005   for BCC L nare s/p Mohs  . nuclear stress test  2014   normal in [place]  . RADIOFREQUENCY ABLATION Left 01/2012   remnant L G saphenous vein below knee  . RADIOFREQUENCY ABLATION Right 02/2013   RLE vein ablation   . VEIN LIGATION AND STRIPPING  remote   bilateral G saphenous veins    There were no vitals filed for this visit.  Subjective Assessment - 01/23/19 1525    Subjective  L knee is not bad. Bothered her the other day. Felt  like burning. But it was temporary. Was on her feel all day that day.   5/10 at most for the past 7 days for a short period (15-20 minutes).  Has not had time to do her HEP during the trip.     Pertinent History  Pt states that she has fallen 2x in the past year, once when she was sitting on a stool, the other time pt tripped on her R foot. Has a difficult time getting up due to weakness.  Nervous about being by herself if she fell. Has been doing her HEP daily.  Pt wears an inflatable machine for her legs.  Pt thinks her cellulitis is gone.  Feels pain lateral knee. Difficulty getting up from the floor because has a hard time kneeling due to knee pain.   Fell 3 times in the last 6 months (once falling off a stool and 2x tripping on her foot, does not remember which foot she tripped with, both falls when she tripped, pt fell on her L side).  Pain has improved since onset a little over 3 months ago. Unknown method of injury. Pt noticed swelling in L lateral knee  before her falls.  Pt also states feeling pain in her neck after working on the computer or cooking.     Patient Stated Goals  Want to be able to get stronger and be able to get up from the floor.     Currently in Pain?  No/denies    Pain Score  0-No pain    Pain Onset  More than a month ago         [facility] PT Assessment - 01/23/19 1527      Strength   Right Hip Flexion  4+/5    Right Hip Extension  5/5   seated manually resisted hip extension   Right Hip ABduction  3+/5    Left Hip Flexion  4+/5    Left Hip Extension  5/5   seated manually resisted hip extension   Left Hip ABduction  4-/5    Right Knee Flexion  5/5    Right Knee Extension  5/5    Left Knee Flexion  5/5    Left Knee Extension  5/5                           PT Education - 01/23/19 1540    Education Details  ther-ex    Person(s) Educated  Patient    Methods  Explanation;Demonstration;Tactile cues;Verbal cues    Comprehension  Returned  demonstration;Verbalized understanding        Objectives  No latex band allergies   MedbridgeAccess Code: [email or web address]  Pt states that she will be at [place] from Jan 16 to January 22/23.  TTP greater trochanter R > L      Therapeutic exercise   Seated manually resisted hip flexion, hip extension, knee flexion, knee extension, S/L hip abduction 1-2x each way for each LE   Reviewed progress/current status with LE strength with pt.   Supine clamshell isometrics 1 min at 40% effort, 1 min rest x 3  Feels cramps B lateral LE along L5/S1 dermatome  Seated physioball rolls  Flexion 10x5 seconds for 2 sets.   No B lateral LE cramps along L5/S1 dermatome afterwards  SLS with B UE assist, emphasis on level pelvis  L LE 1x5 seconds L knee discomfort   R LE 1x5 seconds  Standing hip abduction with B UE assist 2 lbs  R LE 10x, 8x. L knee discomfort.   L LE 10x3   Seated hip adduction ball and glute max squeeze 10x5 seconds.  Decreased L knee pain.,    Improved exercise technique, movement at target joints, use of target muscles after mod verbal, visual, tactile cues.    Worked on Garrett in supine to help with B lateral hip tenderness. Pt demonstrates B LE cramp sensation along the L5/S1 dermatome afterwards which eased with trunk flexion exercise to decrease extension pressure on her nerves. Pt also demonstrates overall improved B LE strength since initial evaluation. Difficulty with glute med strength in which B trochanteric bursa pain may play a factor. Pt will benefit from continued skilled physical therapy services to decrease L knee pain, improve strength and function.           PT Short Term Goals - 01/09/19 1044      PT SHORT TERM GOAL #1   Title  Patient will be independent with her HEP to improve strength, decrease L knee pain, improve function.     Baseline  Pt performing her exercises at home per subjective  reports (01/05/2019)     Time  3    Period  Weeks    Status  On-going    Target Date  12/22/18        PT Long Term Goals - 01/09/19 1045      PT LONG TERM GOAL #1   Title  Patient will have a decrease in L knee pain to 3/10 or less at worst to promote ability to perform standing tasks such as stair negotiation more comfortably.     Baseline  7/10 L knee pain at worst for the past 3 months (11/29/2018); 4/10 at most for the past 7 days (01/05/2019); 3/10 L knee pain at most for the past 7 days (01/09/2019)    Time  8    Period  Weeks    Status  Partially Met    Target Date  01/26/19      PT LONG TERM GOAL #2   Title  Patient will improve bilateral LE strength by at least 1/2 MMT grade to promote ability to perform functional tasks such as stair negotiation as well as floor to stand transfers.     Time  6    Period  Weeks    Status  Partially Met    Target Date  01/26/19      PT LONG TERM GOAL #3   Title  Patient will be able to perform floor to stand transfer with mod independent to promote ability to get up from the floor if patient falls (pt goal).     Baseline  Pt needs assist to get up from the floor after a fall per subjective reports (11/29/2018).     Time  8    Period  Weeks    Status  Deferred    Target Date  01/26/19      PT LONG TERM GOAL #4   Title  Patient will improve her DGI score to at least 19/24 as a demonstration of decreased fall risk.     Baseline  15/24 (11/29/2018); 18/24 (01/09/2019)    Time  8    Period  Weeks    Status  Partially Met    Target Date  01/26/19            Plan - 01/23/19 1542    Clinical Impression Statement  Worked on glute med isometrics in supine to help with B lateral hip tenderness. Pt demonstrates B LE cramp sensation along the L5/S1 dermatome afterwards which eased with trunk flexion exercise to decrease extension pressure on her nerves. Pt also demonstrates overall improved B LE strength since initial evaluation. Difficulty with glute med strength in  which B trochanteric bursa pain may play a factor. Pt will benefit from continued skilled physical therapy services to decrease L knee pain, improve strength and function.     Rehab Potential  Good    Clinical Impairments Affecting Rehab Potential  (-) L knee pain, LE weakness, hx of falls; (+) motivated    PT Frequency  2x / week    PT Duration  8 weeks    PT Treatment/Interventions  Aquatic Therapy;Electrical Stimulation;Iontophoresis '4mg'$ /ml Dexamethasone;Gait training;Stair training;Functional mobility training;Therapeutic activities;Therapeutic exercise;Balance training;Neuromuscular re-education;Patient/family education;Manual techniques   electrical stimulation related treatment and ultrasound if appropriate   PT Next Visit Plan  glute, trunk, scapular strengthening, femoral control, balance training, manual techniques, modalities PRN    Consulted and Agree with Plan of Care  Patient       Patient will benefit from skilled therapeutic  intervention in order to improve the following deficits and impairments:  Postural dysfunction, Abnormal gait, Decreased balance, Decreased range of motion, Decreased strength, Improper body mechanics  Visit Diagnosis: Left knee pain, unspecified chronicity  Muscle weakness (generalized)  History of falling     Problem List Patient Active Problem List   Diagnosis Date Noted  . Itching 08/10/2018  . Varicose veins of bilateral lower extremities with pain 03/30/2018  . Lymphedema 03/30/2018  . Dizziness 02/02/2017  . Situational depression 12/18/2016  . Health maintenance examination 10/23/2016  . Fatigue 08/12/2016  . Essential hypertension 12/17/2015  . Dermatitis of external ear 12/17/2015  . Corn of foot 05/16/2015  . Advanced care planning/counseling discussion 10/30/2014  . Knee pain 08/17/2013  . Oropharyngeal dysphagia 11/15/2012  . Difficulty with family 03/30/2012  . Medicare annual wellness visit, subsequent 03/30/2012  . History  of basal cell carcinoma   . Osteoarthritis   . Osteopenia   . Hypothyroidism   . Glaucoma   . Chronic venous insufficiency     Joneen Boers PT, DPT   01/23/2019, 5:14 PM  [facility] [address] [place], [address] Phone: [phone]   Fax:  [phone]  Name: Beth Rodgers MRN: [id number] Date of Birth: Nov 10, 1935

## 2019-01-24 DIAGNOSIS — I8312 Varicose veins of left lower extremity with inflammation: Secondary | ICD-10-CM | POA: Diagnosis not present

## 2019-01-24 DIAGNOSIS — L400 Psoriasis vulgaris: Secondary | ICD-10-CM | POA: Diagnosis not present

## 2019-01-24 DIAGNOSIS — L98429 Non-pressure chronic ulcer of back with unspecified severity: Secondary | ICD-10-CM | POA: Diagnosis not present

## 2019-01-24 DIAGNOSIS — D485 Neoplasm of uncertain behavior of skin: Secondary | ICD-10-CM | POA: Diagnosis not present

## 2019-01-24 DIAGNOSIS — D1801 Hemangioma of skin and subcutaneous tissue: Secondary | ICD-10-CM | POA: Diagnosis not present

## 2019-01-24 DIAGNOSIS — L738 Other specified follicular disorders: Secondary | ICD-10-CM | POA: Diagnosis not present

## 2019-01-24 DIAGNOSIS — I872 Venous insufficiency (chronic) (peripheral): Secondary | ICD-10-CM | POA: Diagnosis not present

## 2019-01-24 DIAGNOSIS — I8311 Varicose veins of right lower extremity with inflammation: Secondary | ICD-10-CM | POA: Diagnosis not present

## 2019-01-24 DIAGNOSIS — L821 Other seborrheic keratosis: Secondary | ICD-10-CM | POA: Diagnosis not present

## 2019-01-24 DIAGNOSIS — D225 Melanocytic nevi of trunk: Secondary | ICD-10-CM | POA: Diagnosis not present

## 2019-01-24 DIAGNOSIS — D2261 Melanocytic nevi of right upper limb, including shoulder: Secondary | ICD-10-CM | POA: Diagnosis not present

## 2019-01-26 ENCOUNTER — Ambulatory Visit: Payer: Medicare Other

## 2019-01-26 DIAGNOSIS — M6281 Muscle weakness (generalized): Secondary | ICD-10-CM

## 2019-01-26 DIAGNOSIS — Z9181 History of falling: Secondary | ICD-10-CM

## 2019-01-26 DIAGNOSIS — M25562 Pain in left knee: Secondary | ICD-10-CM | POA: Diagnosis not present

## 2019-01-26 DIAGNOSIS — M25572 Pain in left ankle and joints of left foot: Secondary | ICD-10-CM | POA: Diagnosis not present

## 2019-01-26 NOTE — Patient Instructions (Addendum)
MedbridgeAccess Code: [email or web address]  LE neural flossing  10x3  Resisted scap retract red band 10x3 with 5 seconds

## 2019-01-26 NOTE — Therapy (Signed)
[facility] [address], [place], [address] Phone: [phone]   Fax:  [phone]  Physical Therapy Treatment  Patient Details  Name: Beth Rodgers MRN: [id number] Date of Birth: [birth year] Referring Provider (PT): Vickki Hearing, MD   Encounter Date: 01/26/2019  PT End of Session - 01/26/19 1356    Visit Number  13    Number of Visits  29    Date for PT Re-Evaluation  03/09/19    Authorization Type  3    Authorization Time Period  of 10 progress report    PT Start Time  0623   pt arrived late, sign in after session   PT Stop Time  1429    PT Time Calculation (min)  33 min    Equipment Utilized During Treatment  Gait belt    Activity Tolerance  Patient tolerated treatment well    Behavior During Therapy  WFL for tasks assessed/performed       Past Medical History:  Diagnosis Date  . Cellulitis 08/10/2018  . Chronic venous insufficiency    with varicose veins  . Glaucoma   . History  of basal cell carcinoma    left nare  . History of chicken pox   . Hypothyroid   . Osteoarthritis    back pain, L knee pain  . Osteopenia 06/2009   DEXA 11/2015: T -2.3 hip, -2.2 spine, on longterm bisphosphonate/evista  . Pyogenic granuloma 04/16/2016    Past Surgical History:  Procedure Laterality Date  . BREAST BIOPSY     core  . CATARACT EXTRACTION     bilateral  . DEXA  06/2009   T score Spine: -1.8, hip -2.0  . Foam sclerotherapy Bilateral 09/2015   [place], [place]  . NASAL RECONSTRUCTION  2005   for BCC L nare s/p Mohs  . nuclear stress test  2014   normal in [place]  . RADIOFREQUENCY ABLATION Left 01/2012   remnant L G saphenous vein below knee  . RADIOFREQUENCY ABLATION Right 02/2013   RLE vein ablation   . VEIN LIGATION AND STRIPPING  remote   bilateral G saphenous veins    There were no vitals filed for this visit.  Subjective Assessment - 01/26/19 1356    Subjective  Had a  wonderful time walking with her friend 5 blocks 2 days ago. That night, pt had pain in the sides of her L heel. Has a difficult time walking and currently limping.  Has not really walked that about in a while.   7/10 L heel pain currently and when walking.  L lateral knee pain 3/10 currently and at worst for the past 7 days.  L heel is bothering her a lot.     Pertinent History  Pt states that she has fallen 2x in the past year, once when she was sitting on a stool, the other time pt tripped on her R foot. Has a difficult time getting up due to weakness.  Nervous about being by herself if she fell. Has been doing her HEP daily.  Pt wears an inflatable machine for her legs.  Pt thinks her cellulitis is gone.  Feels pain lateral knee. Difficulty getting up from the floor because has a hard time kneeling due to knee pain.   Fell 3 times in the last 6 months (once falling off a stool and 2x tripping on her foot, does not remember which foot she tripped with, both falls when  she tripped, pt fell on her L side).  Pain has improved since onset a little over 3 months ago. Unknown method of injury. Pt noticed swelling in L lateral knee before her falls.  Pt also states feeling pain in her neck after working on the computer or cooking.     Patient Stated Goals  Want to be able to get stronger and be able to get up from the floor.     Currently in Pain?  Yes    Pain Score  7    L heel   Pain Onset  More than a month ago         [facility] PT Assessment - 01/26/19 1931      Observation/Other Assessments   Observations  Slump (+) L LE      Strength   Overall Strength Comments  strength measured 01/23/2019    Right Hip Flexion  4+/5    Right Hip Extension  5/5   seated manually resisted hip extension   Right Hip ABduction  3+/5    Left Hip Flexion  4+/5    Left Hip Extension  5/5   seated manually resisted hip extension   Left Hip ABduction  4-/5    Right Knee Flexion  5/5    Right Knee Extension  5/5     Left Knee Flexion  5/5    Left Knee Extension  5/5                           PT Education - 01/26/19 1415    Education Details  ther-ex, HEP    Person(s) Educated  Patient    Methods  Explanation;Demonstration;Tactile cues;Verbal cues;Handout    Comprehension  Returned demonstration;Verbalized understanding         Objectives  No latex band allergies   MedbridgeAccess Code: [email or web address]    TTP greater trochanter R > L  Worked on improving L heel pain to promote ability to ambulate and perform standing exercises for her L knee, improve ability to ambulate, improve balance  L heel feels like it is burning.     Therapeutic exercise   SLUMP test (+) for L LE   Seated LE neural flossing 10x3. Decreased L heel pain  Seated ankle DF/PF 10x3 decreased L heel pain  seated B scapular retraction red band for 3 sets 10x5 seconds to promote thoracic extension and decrease low back extension pressure  Decreased L heel pain    Improved exercise technique, movement at target joints, use of target muscles after mod verbal, visual, tactile cues.   Decreased L heel pain to 3.5/10 after session.     Pt demonstrates overall improved B LE strength (except hip abductors), and decreased L knee pain pain since initial evaluation. Still demonstrates difficulty with floor to stand transfers secondary to B knee pain when pt kneels partially on the floor to try to stand up as well as with LE weakness. Recent onset in L heel pain due to prolonged walking about 2 days ago. Decreased L heel pain with exercises to improve neural mobility as well as with thoracic extension exercise to help decrease low back extension pressure. Pt will benefit from continued skilled physical therapy services to continue to decrease pain, improve strength and function.               PT Short Term Goals - 01/26/19 1416      PT SHORT TERM GOAL #1  Title  Patient will be independent with  her HEP to improve strength, decrease L knee pain, improve function.     Baseline  Pt performing her exercises at home per subjective reports (01/05/2019), (01/26/2019)    Time  3    Period  Weeks    Status  Achieved    Target Date  12/22/18        PT Long Term Goals - 01/26/19 1416      PT LONG TERM GOAL #1   Title  Patient will have a decrease in L knee pain to 3/10 or less at worst to promote ability to perform standing tasks such as stair negotiation more comfortably.     Baseline  7/10 L knee pain at worst for the past 3 months (11/29/2018); 4/10 at most for the past 7 days (01/05/2019); 3/10 L knee pain at most for the past 7 days (01/09/2019), (01/26/2019)    Time  8    Period  Weeks    Status  Achieved    Target Date  01/26/19      PT LONG TERM GOAL #2   Title  Patient will improve bilateral LE strength by at least 1/2 MMT grade to promote ability to perform functional tasks such as stair negotiation as well as floor to stand transfers.     Time  6    Period  Weeks    Status  Partially Met    Target Date  03/09/19      PT LONG TERM GOAL #3   Title  Patient will be able to perform floor to stand transfer with mod independent to promote ability to get up from the floor if patient falls (pt goal).     Baseline  Pt needs assist to get up from the floor after a fall per subjective reports (11/29/2018). Pt needs assist with floor to stand transfers (01/26/2019)    Time  6    Period  Weeks    Status  On-going    Target Date  03/09/19      PT LONG TERM GOAL #4   Title  Patient will improve her DGI score to at least 19/24 as a demonstration of decreased fall risk.     Baseline  15/24 (11/29/2018); 18/24 (01/09/2019); deferred secondary to L heel pain (01/26/2019)    Time  6    Period  Weeks    Status  Deferred    Target Date  03/09/19      PT LONG TERM GOAL #5   Title  Patient will have a decrease in L heel pain to 3/10 or less at worst to promote ability to ambulate more comfortably.      Baseline  at least 7/10 L heel pain at worst (01/26/2019)    Time  6    Period  Weeks    Status  New    Target Date  03/09/19            Plan - 01/26/19 1416    Clinical Impression Statement  Pt demonstrates overall improved B LE strength (except hip abductors), and decreased L knee pain pain since initial evaluation. Still demonstrates difficulty with floor to stand transfers secondary to B knee pain when pt kneels partially on the floor to try to stand up as well as with LE weakness. Recent onset in L heel pain due to prolonged walking about 2 days ago. Decreased L heel pain with exercises to improve neural mobility as well as with  thoracic extension exercise to help decrease low back extension pressure. Pt will benefit from continued skilled physical therapy services to continue to decrease pain, improve strength and function.     History and Personal Factors relevant to plan of care:  L knee pain, LE weakness, hx of multiple falls, age, L heel pain    Clinical Presentation  Stable    Clinical Presentation due to:  decreased overall L knee pain    Clinical Decision Making  Low    Rehab Potential  Good    Clinical Impairments Affecting Rehab Potential  (-) L knee pain, LE weakness, hx of falls; (+) motivated    PT Frequency  2x / week    PT Duration  6 weeks    PT Treatment/Interventions  Aquatic Therapy;Electrical Stimulation;Iontophoresis 2m/ml Dexamethasone;Gait training;Stair training;Functional mobility training;Therapeutic activities;Therapeutic exercise;Balance training;Neuromuscular re-education;Patient/family education;Manual techniques   electrical stimulation related treatment and ultrasound if appropriate   PT Next Visit Plan  glute, trunk, scapular strengthening, femoral control, balance training, manual techniques, modalities PRN    Consulted and Agree with Plan of Care  Patient       Patient will benefit from skilled therapeutic intervention in order to improve the  following deficits and impairments:  Postural dysfunction, Abnormal gait, Decreased balance, Decreased range of motion, Decreased strength, Improper body mechanics  Visit Diagnosis: Left knee pain, unspecified chronicity - Plan: PT plan of care cert/re-cert  Muscle weakness (generalized) - Plan: PT plan of care cert/re-cert  History of falling - Plan: PT plan of care cert/re-cert  Pain in left ankle and joints of left foot - Plan: PT plan of care cert/re-cert     Problem List Patient Active Problem List   Diagnosis Date Noted  . Itching 08/10/2018  . Varicose veins of bilateral lower extremities with pain 03/30/2018  . Lymphedema 03/30/2018  . Dizziness 02/02/2017  . Situational depression 12/18/2016  . Health maintenance examination 10/23/2016  . Fatigue 08/12/2016  . Essential hypertension 12/17/2015  . Dermatitis of external ear 12/17/2015  . Corn of foot 05/16/2015  . Advanced care planning/counseling discussion 10/30/2014  . Knee pain 08/17/2013  . Oropharyngeal dysphagia 11/15/2012  . Difficulty with family 03/30/2012  . Medicare annual wellness visit, subsequent 03/30/2012  . History of basal cell carcinoma   . Osteoarthritis   . Osteopenia   . Hypothyroidism   . Glaucoma   . Chronic venous insufficiency     MJoneen BoersPT, DPT   01/26/2019, 7:47 PM  [facility] [address] [place] [address]Phone: [phone]  Fax:  [phone] Name: KEZMA REHMMRN: [id number] of Birth: 510/21/1936

## 2019-01-30 ENCOUNTER — Telehealth: Payer: Self-pay | Admitting: Family Medicine

## 2019-01-30 DIAGNOSIS — F4321 Adjustment disorder with depressed mood: Secondary | ICD-10-CM

## 2019-01-30 NOTE — Telephone Encounter (Signed)
Seen at husband's appt.  She requests psychology referral locally.

## 2019-01-31 ENCOUNTER — Ambulatory Visit: Payer: Medicare Other | Attending: Sports Medicine

## 2019-01-31 ENCOUNTER — Other Ambulatory Visit: Payer: Self-pay | Admitting: Family Medicine

## 2019-01-31 DIAGNOSIS — M6281 Muscle weakness (generalized): Secondary | ICD-10-CM

## 2019-01-31 DIAGNOSIS — Z9181 History of falling: Secondary | ICD-10-CM | POA: Diagnosis not present

## 2019-01-31 DIAGNOSIS — M25572 Pain in left ankle and joints of left foot: Secondary | ICD-10-CM | POA: Insufficient documentation

## 2019-01-31 DIAGNOSIS — M25562 Pain in left knee: Secondary | ICD-10-CM | POA: Diagnosis not present

## 2019-01-31 NOTE — Therapy (Signed)
[facility] [address] [place], [place], [address] Phone: [phone]   Fax:  [phone]  Physical Therapy Treatment  Patient Details  Name: Beth Rodgers MRN: [id number] Date of Birth: 11-25-1935 Referring Provider (PT): Vickki Hearing, MD   Encounter Date: 01/31/2019  PT End of Session - 01/31/19 1521    Visit Number  14    Number of Visits  29    Date for PT Re-Evaluation  03/09/19    Authorization Type  4    Authorization Time Period  of 10 progress report    PT Start Time  1522    PT Stop Time  1605    PT Time Calculation (min)  43 min    Equipment Utilized During Treatment  Gait belt    Activity Tolerance  Patient tolerated treatment well    Behavior During Therapy  [facility] for tasks assessed/performed       Past Medical History:  Diagnosis Date  . Cellulitis 08/10/2018  . Chronic venous insufficiency    with varicose veins  . Glaucoma   . History  of basal cell carcinoma    left nare  . History of chicken pox   . Hypothyroid   . Osteoarthritis    back pain, L knee pain  . Osteopenia 06/2009   DEXA 11/2015: T -2.3 hip, -2.2 spine, on longterm bisphosphonate/evista  . Pyogenic granuloma 04/16/2016    Past Surgical History:  Procedure Laterality Date  . BREAST BIOPSY     core  . CATARACT EXTRACTION     bilateral  . DEXA  06/2009   T score Spine: -1.8, hip -2.0  . Foam sclerotherapy Bilateral 09/2015   [place], [place]  . NASAL RECONSTRUCTION  2005   for BCC L nare s/p Mohs  . nuclear stress test  2014   normal in [place]  . RADIOFREQUENCY ABLATION Left 01/2012   remnant L G saphenous vein below knee  . RADIOFREQUENCY ABLATION Right 02/2013   RLE vein ablation   . VEIN LIGATION AND STRIPPING  remote   bilateral G saphenous veins    There were no vitals filed for this visit.  Subjective Assessment - 01/31/19 1523    Subjective  L knee is doing better. Once in a while she feels it. L  heel is not as bad. 1/10 L knee, 3/10 L heel currently.     Pertinent History  Pt states that she has fallen 2x in the past year, once when she was sitting on a stool, the other time pt tripped on her R foot. Has a difficult time getting up due to weakness.  Nervous about being by herself if she fell. Has been doing her HEP daily.  Pt wears an inflatable machine for her legs.  Pt thinks her cellulitis is gone.  Feels pain lateral knee. Difficulty getting up from the floor because has a hard time kneeling due to knee pain.   Fell 3 times in the last 6 months (once falling off a stool and 2x tripping on her foot, does not remember which foot she tripped with, both falls when she tripped, pt fell on her L side).  Pain has improved since onset a little over 3 months ago. Unknown method of injury. Pt noticed swelling in L lateral knee before her falls.  Pt also states feeling pain in her neck after working on the computer or cooking.     Patient Stated Goals  Want  to be able to get stronger and be able to get up from the floor.     Currently in Pain?  Yes    Pain Score  3    L heel   Pain Onset  More than a month ago                               PT Education - 01/31/19 1540    Education Details  ther-ex    [employer]) Educated  Patient    Methods  Explanation;Demonstration;Tactile cues;Verbal cues    Comprehension  Returned demonstration;Verbalized understanding         Objectives  No latex band allergies   MedbridgeAccess Code: [email or web address]    TTP greater trochanter R > L  Worked on improving L heel pain to promote ability to ambulate and perform standing exercises for her L knee, improve ability to ambulate, improve balance  L heel feels like it is burning.     Therapeutic exercise   Standing ankle DF/PF on rocker board 2 minutes  Decreased L heel pain   Seated manually resisted hip extension from end range hip flexion to promote ability to  perform floor to stand transfers   R 10x2  L 10x2  Seated L LE neural flossing 10x2.   Seated knee extension from end range knee flexion to promote ability to perform floor to stand transfers   R 10x2   L 10x2   Side stepping with band X hand held pattern to promote glute med muscle strength.   10 ft to the R and 10 ft to the L green band. Very difficult for pt.   Then yellow band 10 ft to the R and 10 ft to the L   Forward step up onto 1st regular step with L LE and B UE assist, emphasis on femoral control   5x3 No L knee pain   Improved exercise technique, movement at target joints, use of target muscles after min to mod verbal, visual, tactile cues.    Decreased L heel pain. Good muscle use felt with exercises. Pt tolerated session well without aggravation of symptoms.     Continued working on L LE neural flossing and ankle mobility to decrease L heel pain. Worked on improving B hip extension and knee extension strength from end range flexion to promote ability to perform floor to stand transfers. Also worked on [place] and femoral control to promote proper mechanics at her knee when performing closed chain tasks. No L knee pain during step ups onto regular step. Pt tolerated session well without aggravation of symptoms. Pt will benefit from continued skilled physical therapy services to decrease L knee and heel pain, improve strength, balance, and function.      PT Short Term Goals - 01/26/19 1416      PT SHORT TERM GOAL #1   Title  Patient will be independent with her HEP to improve strength, decrease L knee pain, improve function.     Baseline  Pt performing her exercises at home per subjective reports (01/05/2019), (01/26/2019)    Time  3    Period  Weeks    Status  Achieved    Target Date  12/22/18        PT Long Term Goals - 01/26/19 1416      PT LONG TERM GOAL #1   Title  Patient will have a decrease in L knee pain  to 3/10 or less at worst to  promote ability to perform standing tasks such as stair negotiation more comfortably.     Baseline  7/10 L knee pain at worst for the past 3 months (11/29/2018); 4/10 at most for the past 7 days (01/05/2019); 3/10 L knee pain at most for the past 7 days (01/09/2019), (01/26/2019)    Time  8    Period  Weeks    Status  Achieved    Target Date  01/26/19      PT LONG TERM GOAL #2   Title  Patient will improve bilateral LE strength by at least 1/2 MMT grade to promote ability to perform functional tasks such as stair negotiation as well as floor to stand transfers.     Time  6    Period  Weeks    Status  Partially Met    Target Date  03/09/19      PT LONG TERM GOAL #3   Title  Patient will be able to perform floor to stand transfer with mod independent to promote ability to get up from the floor if patient falls (pt goal).     Baseline  Pt needs assist to get up from the floor after a fall per subjective reports (11/29/2018). Pt needs assist with floor to stand transfers (01/26/2019)    Time  6    Period  Weeks    Status  On-going    Target Date  03/09/19      PT LONG TERM GOAL #4   Title  Patient will improve her DGI score to at least 19/24 as a demonstration of decreased fall risk.     Baseline  15/24 (11/29/2018); 18/24 (01/09/2019); deferred secondary to L heel pain (01/26/2019)    Time  6    Period  Weeks    Status  Deferred    Target Date  03/09/19      PT LONG TERM GOAL #5   Title  Patient will have a decrease in L heel pain to 3/10 or less at worst to promote ability to ambulate more comfortably.     Baseline  at least 7/10 L heel pain at worst (01/26/2019)    Time  6    Period  Weeks    Status  New    Target Date  03/09/19            Plan - 01/31/19 1545    Clinical Impression Statement  Continued working on L LE neural flossing and ankle mobility to decrease L heel pain. Worked on improving B hip extension and knee extension strength from end range flexion to promote ability  to perform floor to stand transfers. Also worked on [employer] and femoral control to promote proper mechanics at her knee when performing closed chain tasks. No L knee pain during step ups onto regular step. Pt tolerated session well without aggravation of symptoms. Pt will benefit from continued skilled physical therapy services to decrease L knee and heel pain, improve strength, balance, and function.     Rehab Potential  Good    Clinical Impairments Affecting Rehab Potential  (-) L knee pain, LE weakness, hx of falls; (+) motivated    PT Frequency  2x / week    PT Duration  6 weeks    PT Treatment/Interventions  Aquatic Therapy;Electrical Stimulation;Iontophoresis '4mg'$ /ml Dexamethasone;Gait training;Stair training;Functional mobility training;Therapeutic activities;Therapeutic exercise;Balance training;Neuromuscular re-education;Patient/family education;Manual techniques   electrical stimulation related treatment and ultrasound if appropriate  PT Next Visit Plan  glute, trunk, scapular strengthening, femoral control, balance training, manual techniques, modalities PRN    Consulted and Agree with Plan of Care  Patient       Patient will benefit from skilled therapeutic intervention in order to improve the following deficits and impairments:  Postural dysfunction, Abnormal gait, Decreased balance, Decreased range of motion, Decreased strength, Improper body mechanics  Visit Diagnosis: Left knee pain, unspecified chronicity  Muscle weakness (generalized)  History of falling  Pain in left ankle and joints of left foot     Problem List Patient Active Problem List   Diagnosis Date Noted  . Itching 08/10/2018  . Varicose veins of bilateral lower extremities with pain 03/30/2018  . Lymphedema 03/30/2018  . Dizziness 02/02/2017  . Situational depression 12/18/2016  . Health maintenance examination 10/23/2016  . Fatigue 08/12/2016  . Essential hypertension 12/17/2015  .  Dermatitis of external ear 12/17/2015  . Corn of foot 05/16/2015  . Advanced care planning/counseling discussion 10/30/2014  . Knee pain 08/17/2013  . Oropharyngeal dysphagia 11/15/2012  . Difficulty with family 03/30/2012  . Medicare annual wellness visit, subsequent 03/30/2012  . History of basal cell carcinoma   . Osteoarthritis   . Osteopenia   . Hypothyroidism   . Glaucoma   . Chronic venous insufficiency     Joneen Boers PT, DPT   01/31/2019, 4:17 PM  [facility] [address] [place], [address] Phone: [phone]   Fax:  [phone]  Name: Beth Rodgers MRN: [id number] Date of Birth: [birth year]

## 2019-02-02 ENCOUNTER — Ambulatory Visit: Payer: Medicare Other

## 2019-02-02 DIAGNOSIS — Z9181 History of falling: Secondary | ICD-10-CM | POA: Diagnosis not present

## 2019-02-02 DIAGNOSIS — M25562 Pain in left knee: Secondary | ICD-10-CM

## 2019-02-02 DIAGNOSIS — M25572 Pain in left ankle and joints of left foot: Secondary | ICD-10-CM

## 2019-02-02 DIAGNOSIS — M6281 Muscle weakness (generalized): Secondary | ICD-10-CM

## 2019-02-02 NOTE — Therapy (Signed)
[facility] [address], [place], [address] Phone: [phone]   Fax:  [phone]  Physical Therapy Treatment  Patient Details  Name: Beth Rodgers MRN: [id number] Date of Birth: Sep 08, 1935 Referring Provider (PT): Vickki Hearing, MD   Encounter Date: 02/02/2019  PT End of Session - 02/02/19 0909    Visit Number  15    Number of Visits  29    Date for PT Re-Evaluation  03/09/19    Authorization Type  5    Authorization Time Period  of 10 progress report    PT Start Time  0910    PT Stop Time  0954    PT Time Calculation (min)  44 min    Equipment Utilized During Treatment  --    Activity Tolerance  Patient tolerated treatment well    Behavior During Therapy  [facility] for tasks assessed/performed       Past Medical History:  Diagnosis Date  . Cellulitis 08/10/2018  . Chronic venous insufficiency    with varicose veins  . Glaucoma   . History  of basal cell carcinoma    left nare  . History of chicken pox   . Hypothyroid   . Osteoarthritis    back pain, L knee pain  . Osteopenia 06/2009   DEXA 11/2015: T -2.3 hip, -2.2 spine, on longterm bisphosphonate/evista  . Pyogenic granuloma 04/16/2016    Past Surgical History:  Procedure Laterality Date  . BREAST BIOPSY     core  . CATARACT EXTRACTION     bilateral  . DEXA  06/2009   T score Spine: -1.8, hip -2.0  . Foam sclerotherapy Bilateral 09/2015   [place], [place]  . NASAL RECONSTRUCTION  2005   for BCC L nare s/p Mohs  . nuclear stress test  2014   normal in [place]  . RADIOFREQUENCY ABLATION Left 01/2012   remnant L G saphenous vein below knee  . RADIOFREQUENCY ABLATION Right 02/2013   RLE vein ablation   . VEIN LIGATION AND STRIPPING  remote   bilateral G saphenous veins    There were no vitals filed for this visit.  Subjective Assessment - 02/02/19 0911    Subjective  L knee is doing well. No L knee pain.  2.5/10 L heel pain  currently when walking. Has been getting a little bit better since working on it.     Pertinent History  Pt states that she has fallen 2x in the past year, once when she was sitting on a stool, the other time pt tripped on her R foot. Has a difficult time getting up due to weakness.  Nervous about being by herself if she fell. Has been doing her HEP daily.  Pt wears an inflatable machine for her legs.  Pt thinks her cellulitis is gone.  Feels pain lateral knee. Difficulty getting up from the floor because has a hard time kneeling due to knee pain.   Fell 3 times in the last 6 months (once falling off a stool and 2x tripping on her foot, does not remember which foot she tripped with, both falls when she tripped, pt fell on her L side).  Pain has improved since onset a little over 3 months ago. Unknown method of injury. Pt noticed swelling in L lateral knee before her falls.  Pt also states feeling pain in her neck after working on the computer or cooking.     Patient Stated Goals  Want to be able to get stronger and be able to get up from the floor.     Currently in Pain?  Yes    Pain Score  3    2.5/10 L heel pain   Pain Onset  More than a month ago                               PT Education - 02/02/19 0915    Education Details  ther-ex, HEP    Person(s) Educated  Patient    Methods  Explanation;Demonstration;Tactile cues;Verbal cues;Handout    Comprehension  Returned demonstration;Verbalized understanding        Objectives  No latex band allergies   MedbridgeAccess Code: [email or web address]    TTP greater trochanter R > L  Worked on improving L heel pain to promote ability to ambulate and perform standing exercises for her L knee, improve ability to ambulate, improve balance  L heel feels like it is burning.    Therapeutic exercise   Standing ankle DF/PF on rocker board 2 minutes             Decreased L heel pain  Seated thoracic extension over  chair to help decrease low back extension pressure  10x5 seconds for 2 sets  Decreased L heel pain  Standing eccentric heel raises with B UE asssist   10x2   Low back discomfort  Almost no L heel pain aftewards  seated trunk flexion  Decreased low back pain   Seated R hip extension isometrics 10x5 seconds for 2 sets  Running man R LE   10x3  Decreased back pain  Seated eccentric PF with strap and PT  10x  The self resist with strap 10x   Increased L heel pain with gait  Seated heel toe raises 10x2  Try standing forward weight shift next visit for heel pain if appropriate.    Improved exercise technique, movement at target joints, use of target muscles after min to mod verbal, visual, tactile cues.    Decreased R heel pain with eccentric PF initially with weight bearing. Increased heel pain with eccentric PF with strap. Fair tolerance to therapy today.   Decreased R heel pain with eccentric PF initially with weight bearing. Increased heel pain with eccentric PF with strap. Focused more on decreasing pain in that area to decrease difficulty ambulating and improve balance. Fair tolerance to therapy today. Pt will benefit from continued skilled physical therapy services to improve strength, decrease knee and heel pain, improve function, and balance.       PT Short Term Goals - 01/26/19 1416      PT SHORT TERM GOAL #1   Title  Patient will be independent with her HEP to improve strength, decrease L knee pain, improve function.     Baseline  Pt performing her exercises at home per subjective reports (01/05/2019), (01/26/2019)    Time  3    Period  Weeks    Status  Achieved    Target Date  12/22/18        PT Long Term Goals - 01/26/19 1416      PT LONG TERM GOAL #1   Title  Patient will have a decrease in L knee pain to 3/10 or less at worst to promote ability to perform standing tasks such as stair negotiation more comfortably.     Baseline  7/10 L knee pain at worst  for the  past 3 months (11/29/2018); 4/10 at most for the past 7 days (01/05/2019); 3/10 L knee pain at most for the past 7 days (01/09/2019), (01/26/2019)    Time  8    Period  Weeks    Status  Achieved    Target Date  01/26/19      PT LONG TERM GOAL #2   Title  Patient will improve bilateral LE strength by at least 1/2 MMT grade to promote ability to perform functional tasks such as stair negotiation as well as floor to stand transfers.     Time  6    Period  Weeks    Status  Partially Met    Target Date  03/09/19      PT LONG TERM GOAL #3   Title  Patient will be able to perform floor to stand transfer with mod independent to promote ability to get up from the floor if patient falls (pt goal).     Baseline  Pt needs assist to get up from the floor after a fall per subjective reports (11/29/2018). Pt needs assist with floor to stand transfers (01/26/2019)    Time  6    Period  Weeks    Status  On-going    Target Date  03/09/19      PT LONG TERM GOAL #4   Title  Patient will improve her DGI score to at least 19/24 as a demonstration of decreased fall risk.     Baseline  15/24 (11/29/2018); 18/24 (01/09/2019); deferred secondary to L heel pain (01/26/2019)    Time  6    Period  Weeks    Status  Deferred    Target Date  03/09/19      PT LONG TERM GOAL #5   Title  Patient will have a decrease in L heel pain to 3/10 or less at worst to promote ability to ambulate more comfortably.     Baseline  at least 7/10 L heel pain at worst (01/26/2019)    Time  6    Period  Weeks    Status  New    Target Date  03/09/19            Plan - 02/02/19 0916    Clinical Impression Statement  Decreased R heel pain with eccentric PF initially with weight bearing. Increased heel pain with eccentric PF with strap. Focused more on decreasing pain in that area to decrease difficulty ambulating and improve balance. Fair tolerance to therapy today. Pt will benefit from continued skilled physical therapy services  to improve strength, decrease knee and heel pain, improve function, and balance.     Rehab Potential  Good    Clinical Impairments Affecting Rehab Potential  (-) L knee pain, LE weakness, hx of falls; (+) motivated    PT Frequency  2x / week    PT Duration  6 weeks    PT Treatment/Interventions  Aquatic Therapy;Electrical Stimulation;Iontophoresis '4mg'$ /ml Dexamethasone;Gait training;Stair training;Functional mobility training;Therapeutic activities;Therapeutic exercise;Balance training;Neuromuscular re-education;Patient/family education;Manual techniques   electrical stimulation related treatment and ultrasound if appropriate   PT Next Visit Plan  glute, trunk, scapular strengthening, femoral control, balance training, manual techniques, modalities PRN    Consulted and Agree with Plan of Care  Patient       Patient will benefit from skilled therapeutic intervention in order to improve the following deficits and impairments:  Postural dysfunction, Abnormal gait, Decreased balance, Decreased range of motion, Decreased strength, Improper body mechanics  Visit Diagnosis: Left  knee pain, unspecified chronicity  Muscle weakness (generalized)  History of falling  Pain in left ankle and joints of left foot     Problem List Patient Active Problem List   Diagnosis Date Noted  . Itching 08/10/2018  . Varicose veins of bilateral lower extremities with pain 03/30/2018  . Lymphedema 03/30/2018  . Dizziness 02/02/2017  . Situational depression 12/18/2016  . Health maintenance examination 10/23/2016  . Fatigue 08/12/2016  . Essential hypertension 12/17/2015  . Dermatitis of external ear 12/17/2015  . Corn of foot 05/16/2015  . Advanced care planning/counseling discussion 10/30/2014  . Knee pain 08/17/2013  . Oropharyngeal dysphagia 11/15/2012  . Difficulty with family 03/30/2012  . Medicare annual wellness visit, subsequent 03/30/2012  . History of basal cell carcinoma   . Osteoarthritis    . Osteopenia   . Hypothyroidism   . Glaucoma   . Chronic venous insufficiency     Joneen Boers PT, DPT   02/02/2019, 5:16 PM  [facility] [address] [place], [address] Phone: [phone]   Fax:  [phone]  Name: Beth Rodgers MRN: [id number] Date of Birth: 1935-04-15

## 2019-02-02 NOTE — Patient Instructions (Signed)
MedbridgeAccess Code: [email or web address]  Seated Thoracic Lumbar Extension  10x3

## 2019-02-07 ENCOUNTER — Ambulatory Visit: Payer: Medicare Other

## 2019-02-07 DIAGNOSIS — M6281 Muscle weakness (generalized): Secondary | ICD-10-CM

## 2019-02-07 DIAGNOSIS — Z9181 History of falling: Secondary | ICD-10-CM | POA: Diagnosis not present

## 2019-02-07 DIAGNOSIS — M25572 Pain in left ankle and joints of left foot: Secondary | ICD-10-CM

## 2019-02-07 DIAGNOSIS — M25562 Pain in left knee: Secondary | ICD-10-CM | POA: Diagnosis not present

## 2019-02-07 NOTE — Therapy (Signed)
[facility] [address] [place], [address] Phone: [phone]   Fax:  [phone]  Physical Therapy Treatment  Patient Details  Name: Beth Rodgers MRN: [id number] Date of Birth: 30-Sep-1935 Referring Provider (PT): Vickki Hearing, MD   Encounter Date: 02/07/2019  PT End of Session - 02/07/19 1351    Visit Number  16    Number of Visits  29    Date for PT Re-Evaluation  03/09/19    Authorization Type  6    Authorization Time Period  of 10 progress report    PT Start Time  1352    PT Stop Time  1445    PT Time Calculation (min)  53 min    Activity Tolerance  Patient tolerated treatment well    Behavior During Therapy  [facility] for tasks assessed/performed       Past Medical History:  Diagnosis Date  . Cellulitis 08/10/2018  . Chronic venous insufficiency    with varicose veins  . Glaucoma   . History  of basal cell carcinoma    left nare  . History of chicken pox   . Hypothyroid   . Osteoarthritis    back pain, L knee pain  . Osteopenia 06/2009   DEXA 11/2015: T -2.3 hip, -2.2 spine, on longterm bisphosphonate/evista  . Pyogenic granuloma 04/16/2016    Past Surgical History:  Procedure Laterality Date  . BREAST BIOPSY     core  . CATARACT EXTRACTION     bilateral  . DEXA  06/2009   T score Spine: -1.8, hip -2.0  . Foam sclerotherapy Bilateral 09/2015   [place], [place]  . NASAL RECONSTRUCTION  2005   for BCC L nare s/p Mohs  . nuclear stress test  2014   normal in [place]  . RADIOFREQUENCY ABLATION Left 01/2012   remnant L G saphenous vein below knee  . RADIOFREQUENCY ABLATION Right 02/2013   RLE vein ablation   . VEIN LIGATION AND STRIPPING  remote   bilateral G saphenous veins    There were no vitals filed for this visit.  Subjective Assessment - 02/07/19 1353    Subjective  Felt L knee pinching when lying down in bed. When she got up, felt more L heel pain. Still sensitive but  not super sensitive when she touches her L knee.  2/10 L knee when walking currently, 3/10 L heel when walking.  L knee gave way as she was walking earlier.     Pertinent History  Pt states that she has fallen 2x in the past year, once when she was sitting on a stool, the other time pt tripped on her R foot. Has a difficult time getting up due to weakness.  Nervous about being by herself if she fell. Has been doing her HEP daily.  Pt wears an inflatable machine for her legs.  Pt thinks her cellulitis is gone.  Feels pain lateral knee. Difficulty getting up from the floor because has a hard time kneeling due to knee pain.   Fell 3 times in the last 6 months (once falling off a stool and 2x tripping on her foot, does not remember which foot she tripped with, both falls when she tripped, pt fell on her L side).  Pain has improved since onset a little over 3 months ago. Unknown method of injury. Pt noticed swelling in L lateral knee before her falls.  Pt also states feeling pain in her  neck after working on the computer or cooking.     Patient Stated Goals  Want to be able to get stronger and be able to get up from the floor.     Currently in Pain?  Yes    Pain Score  3    L heel   Pain Onset  More than a month ago         [facility] PT Assessment - 02/07/19 1357      AROM   Lumbar Flexion  WFL    Lumbar Extension  WFL    Lumbar - Right Side [facility] with slight L heel pinch sensation    Lumbar - Left Side Bend  [facility]     Lumbar - Right Rotation  [facility]    Lumbar - Left Rotation  [facility]                           PT Education - 02/07/19 1356    Education Details  ther-ex    Person(s) Educated  Patient    Methods  Explanation;Demonstration;Tactile cues;Verbal cues    Comprehension  Returned demonstration;Verbalized understanding      Objectives  No latex band allergies   MedbridgeAccess Code: [email or web address]    TTP greater trochanter R > L  Worked on improving L heel pain  to promote ability to ambulate and perform standing exercises for her L knee, improve ability to ambulate, improve balance  L heel feels like it is burning.    Therapeutic exercise   Standing ankle DF/PF on rocker board 2 minutes Decreased L heel pain  Standing trunk flexion, extension, side bend, rotation 1x each way  Standing forward weight shifting 10x5 seconds for 2 sets  Decreased L heel pain with walking afterwards   seated manually resisted eccentric L ankle PF with PT 2x  L gastroc cramp, decreased with stretch    Seated L gastroc stretch 30 seconds x 3   Side stepping 32 ft to the R and 32 ft to the L to promote glute med muscle strengthening. Felt good glute med muscle use.   Forward wedding march 32 ft x 2 to promote glute med muscle strengthening. L LE buckling towards the end of each due to lateral knee joint pain.   Improved exercise technique, movement at target joints, use of target muscles after min to mod verbal, visual, tactile cues.    Manual therapy  Seated STM L lateral hamstings to decrease tension  Supine L tibia IR grade 1-2 for pain control. L knee feels better afterwards.   Decreased L knee and heel pain afterwards   Worked on gentle L gastroc muscle isometrics and stretching to help decrease L heel pain. Worked on glute med muscle strengthening and femoral control to help decrease L knee pain with closed chain activities. Slight L knee buckling around 30 ft of the forward wedding march due to lateral knee pain. Decreased L lateral knee pain with gait following gentle manual therapy (L medial rotation of tibia grade 1-2) for pain control. Decreased L knee and L heel pain with walking after session. Pt will benefit from continued skilled physical therapy services to decrease pain, improve strength, pelvic and femoral control, and function.       PT Short Term Goals - 01/26/19 1416      PT SHORT TERM GOAL #1   Title  Patient  will be independent with her HEP to improve strength, decrease L  knee pain, improve function.     Baseline  Pt performing her exercises at home per subjective reports (01/05/2019), (01/26/2019)    Time  3    Period  Weeks    Status  Achieved    Target Date  12/22/18        PT Long Term Goals - 01/26/19 1416      PT LONG TERM GOAL #1   Title  Patient will have a decrease in L knee pain to 3/10 or less at worst to promote ability to perform standing tasks such as stair negotiation more comfortably.     Baseline  7/10 L knee pain at worst for the past 3 months (11/29/2018); 4/10 at most for the past 7 days (01/05/2019); 3/10 L knee pain at most for the past 7 days (01/09/2019), (01/26/2019)    Time  8    Period  Weeks    Status  Achieved    Target Date  01/26/19      PT LONG TERM GOAL #2   Title  Patient will improve bilateral LE strength by at least 1/2 MMT grade to promote ability to perform functional tasks such as stair negotiation as well as floor to stand transfers.     Time  6    Period  Weeks    Status  Partially Met    Target Date  03/09/19      PT LONG TERM GOAL #3   Title  Patient will be able to perform floor to stand transfer with mod independent to promote ability to get up from the floor if patient falls (pt goal).     Baseline  Pt needs assist to get up from the floor after a fall per subjective reports (11/29/2018). Pt needs assist with floor to stand transfers (01/26/2019)    Time  6    Period  Weeks    Status  On-going    Target Date  03/09/19      PT LONG TERM GOAL #4   Title  Patient will improve her DGI score to at least 19/24 as a demonstration of decreased fall risk.     Baseline  15/24 (11/29/2018); 18/24 (01/09/2019); deferred secondary to L heel pain (01/26/2019)    Time  6    Period  Weeks    Status  Deferred    Target Date  03/09/19      PT LONG TERM GOAL #5   Title  Patient will have a decrease in L heel pain to 3/10 or less at worst to promote ability to  ambulate more comfortably.     Baseline  at least 7/10 L heel pain at worst (01/26/2019)    Time  6    Period  Weeks    Status  New    Target Date  03/09/19            Plan - 02/07/19 1404    Clinical Impression Statement  Worked on gentle L gastroc muscle isometrics and stretching to help decrease L heel pain. Worked on glute med muscle strengthening and femoral control to help decrease L knee pain with closed chain activities. Slight L knee buckling around 30 ft of the forward wedding march due to lateral knee pain. Decreased L lateral knee pain with gait following gentle manual therapy (L medial rotation of tibia grade 1-2) for pain control. Decreased L knee and L heel pain with walking after session. Pt will benefit from continued skilled physical therapy services to  decrease pain, improve strength, pelvic and femoral control, and function.     Rehab Potential  Good    Clinical Impairments Affecting Rehab Potential  (-) L knee pain, LE weakness, hx of falls; (+) motivated    PT Frequency  2x / week    PT Duration  6 weeks    PT Treatment/Interventions  Aquatic Therapy;Electrical Stimulation;Iontophoresis 65m/ml Dexamethasone;Gait training;Stair training;Functional mobility training;Therapeutic activities;Therapeutic exercise;Balance training;Neuromuscular re-education;Patient/family education;Manual techniques   electrical stimulation related treatment and ultrasound if appropriate   PT Next Visit Plan  glute, trunk, scapular strengthening, femoral control, balance training, manual techniques, modalities PRN    Consulted and Agree with Plan of Care  Patient       Patient will benefit from skilled therapeutic intervention in order to improve the following deficits and impairments:  Postural dysfunction, Abnormal gait, Decreased balance, Decreased range of motion, Decreased strength, Improper body mechanics  Visit Diagnosis: Left knee pain, unspecified chronicity  Muscle weakness  (generalized)  History of falling  Pain in left ankle and joints of left foot     Problem List Patient Active Problem List   Diagnosis Date Noted  . Itching 08/10/2018  . Varicose veins of bilateral lower extremities with pain 03/30/2018  . Lymphedema 03/30/2018  . Dizziness 02/02/2017  . Situational depression 12/18/2016  . Health maintenance examination 10/23/2016  . Fatigue 08/12/2016  . Essential hypertension 12/17/2015  . Dermatitis of external ear 12/17/2015  . Corn of foot 05/16/2015  . Advanced care planning/counseling discussion 10/30/2014  . Knee pain 08/17/2013  . Oropharyngeal dysphagia 11/15/2012  . Difficulty with family 03/30/2012  . Medicare annual wellness visit, subsequent 03/30/2012  . History of basal cell carcinoma   . Osteoarthritis   . Osteopenia   . Hypothyroidism   . Glaucoma   . Chronic venous insufficiency     MJoneen BoersPT, DPT   02/07/2019, 2:57 PM  [facility] [address] [place] [address]Phone: [phone]  Fax:  [phone] Name: Beth HELLUMSMRN: [id number] of Birth: 5November 01, 1936

## 2019-02-09 ENCOUNTER — Ambulatory Visit: Payer: Medicare Other

## 2019-02-09 DIAGNOSIS — M6281 Muscle weakness (generalized): Secondary | ICD-10-CM

## 2019-02-09 DIAGNOSIS — M25562 Pain in left knee: Secondary | ICD-10-CM | POA: Diagnosis not present

## 2019-02-09 DIAGNOSIS — M25572 Pain in left ankle and joints of left foot: Secondary | ICD-10-CM

## 2019-02-09 DIAGNOSIS — Z9181 History of falling: Secondary | ICD-10-CM | POA: Diagnosis not present

## 2019-02-09 NOTE — Therapy (Signed)
[facility] [address] [place], [address] Phone: [phone]   Fax:  [phone]  Physical Therapy Treatment  Patient Details  Name: Beth Rodgers MRN: [id number] Date of Birth: 1935-01-24 Referring Provider (PT): Vickki Hearing, MD   Encounter Date: 02/09/2019  PT End of Session - 02/09/19 1654    Visit Number  17    Number of Visits  29    Date for PT Re-Evaluation  03/09/19    Authorization Type  7    Authorization Time Period  of 10 progress report    PT Start Time  1654    PT Stop Time  7092    PT Time Calculation (min)  50 min    Activity Tolerance  Patient tolerated treatment well    Behavior During Therapy  [facility] for tasks assessed/performed       Past Medical History:  Diagnosis Date  . Cellulitis 08/10/2018  . Chronic venous insufficiency    with varicose veins  . Glaucoma   . History  of basal cell carcinoma    left nare  . History of chicken pox   . Hypothyroid   . Osteoarthritis    back pain, L knee pain  . Osteopenia 06/2009   DEXA 11/2015: T -2.3 hip, -2.2 spine, on longterm bisphosphonate/evista  . Pyogenic granuloma 04/16/2016    Past Surgical History:  Procedure Laterality Date  . BREAST BIOPSY     core  . CATARACT EXTRACTION     bilateral  . DEXA  06/2009   T score Spine: -1.8, hip -2.0  . Foam sclerotherapy Bilateral 09/2015   [place], [place]  . NASAL RECONSTRUCTION  2005   for BCC L nare s/p Mohs  . nuclear stress test  2014   normal in [place]  . RADIOFREQUENCY ABLATION Left 01/2012   remnant L G saphenous vein below knee  . RADIOFREQUENCY ABLATION Right 02/2013   RLE vein ablation   . VEIN LIGATION AND STRIPPING  remote   bilateral G saphenous veins    There were no vitals filed for this visit.  Subjective Assessment - 02/09/19 1656    Subjective  L knee and L heel are better. Also used an ointment for arthritic pain this morning after taking a shower.  Did her exercises before using the ointment.  1/10 L knee, 2/10 L heel pain currently. L knee buckled one time yesterday evening. Was more on her feet yesterday.   Feels like her legs are getting stronger.     Pertinent History  Pt states that she has fallen 2x in the past year, once when she was sitting on a stool, the other time pt tripped on her R foot. Has a difficult time getting up due to weakness.  Nervous about being by herself if she fell. Has been doing her HEP daily.  Pt wears an inflatable machine for her legs.  Pt thinks her cellulitis is gone.  Feels pain lateral knee. Difficulty getting up from the floor because has a hard time kneeling due to knee pain.   Fell 3 times in the last 6 months (once falling off a stool and 2x tripping on her foot, does not remember which foot she tripped with, both falls when she tripped, pt fell on her L side).  Pain has improved since onset a little over 3 months ago. Unknown method of injury. Pt noticed swelling in L lateral knee before her falls.  Pt  also states feeling pain in her neck after working on the computer or cooking.     Patient Stated Goals  Want to be able to get stronger and be able to get up from the floor.     Currently in Pain?  Yes    Pain Score  2    1/10 L knee, 2/10 L heel    Pain Onset  More than a month ago                               PT Education - 02/09/19 1730    Education Details  ther-ex, HEP    Person(s) Educated  Patient    Methods  Explanation;Demonstration;Tactile cues;Verbal cues;Handout    Comprehension  Returned demonstration;Verbalized understanding        Objectives  No latex band allergies   MedbridgeAccess Code: [email or web address]    TTP greater trochanter R > L  Worked on improving L heel pain to promote ability to ambulate and perform standing exercises for her L knee, improve ability to ambulate, improve balance  L heel feels like it is burning.    Manual  therapy  Seated STM L lateral hamstings to decrease tension  Supine L tibia IR grade 1-2 for pain control. L knee feels better afterwards.    Seated STM to gastroc soleus distal area to decrease tension    Decreased heel pain with gait    . Therapeutic exercise   Standing ankle DF/PF on rocker board 2 minutes to promote ankle mobility  Standing forward weight shifting 10x5 seconds for 2 sets             Decreased L heel pain with walking afterwards  Reviewed and given as part of her HEP. Pt demonstrated and verbalized understanding. Handout provided.   Standing L gastroc stretch   30 seconds x 2  Slight increased L heel pain with gait afterwards  Improved exercise technique, movement at target joints, use of target muscles after min to mod verbal, visual, tactile cues.   Decreased L heel pain after treatment to decrease tension to L gastroc soleus area as well as with gentle isometric plantar flexion. Provided isometric gastroc exercise in standing to help with heel/Achilles pain. Decreased L knee pain with gait after gentle manual therapy to promote medial rotation at L tibia. Pt will benefit from continued skilled physical therapy to decrease pain, improve strength and function.              PT Short Term Goals - 01/26/19 1416      PT SHORT TERM GOAL #1   Title  Patient will be independent with her HEP to improve strength, decrease L knee pain, improve function.     Baseline  Pt performing her exercises at home per subjective reports (01/05/2019), (01/26/2019)    Time  3    Period  Weeks    Status  Achieved    Target Date  12/22/18        PT Long Term Goals - 01/26/19 1416      PT LONG TERM GOAL #1   Title  Patient will have a decrease in L knee pain to 3/10 or less at worst to promote ability to perform standing tasks such as stair negotiation more comfortably.     Baseline  7/10 L knee pain at worst for the past 3 months (11/29/2018); 4/10 at most for  the past 7 days (  01/05/2019); 3/10 L knee pain at most for the past 7 days (01/09/2019), (01/26/2019)    Time  8    Period  Weeks    Status  Achieved    Target Date  01/26/19      PT LONG TERM GOAL #2   Title  Patient will improve bilateral LE strength by at least 1/2 MMT grade to promote ability to perform functional tasks such as stair negotiation as well as floor to stand transfers.     Time  6    Period  Weeks    Status  Partially Met    Target Date  03/09/19      PT LONG TERM GOAL #3   Title  Patient will be able to perform floor to stand transfer with mod independent to promote ability to get up from the floor if patient falls (pt goal).     Baseline  Pt needs assist to get up from the floor after a fall per subjective reports (11/29/2018). Pt needs assist with floor to stand transfers (01/26/2019)    Time  6    Period  Weeks    Status  On-going    Target Date  03/09/19      PT LONG TERM GOAL #4   Title  Patient will improve her DGI score to at least 19/24 as a demonstration of decreased fall risk.     Baseline  15/24 (11/29/2018); 18/24 (01/09/2019); deferred secondary to L heel pain (01/26/2019)    Time  6    Period  Weeks    Status  Deferred    Target Date  03/09/19      PT LONG TERM GOAL #5   Title  Patient will have a decrease in L heel pain to 3/10 or less at worst to promote ability to ambulate more comfortably.     Baseline  at least 7/10 L heel pain at worst (01/26/2019)    Time  6    Period  Weeks    Status  New    Target Date  03/09/19            Plan - 02/09/19 1753    Clinical Impression Statement  Decreased L heel pain after treatment to decrease tension to L gastroc soleus area as well as with gentle isometric plantar flexion. Provided isometric gastroc exercise in standing to help with heel/Achilles pain. Decreased L knee pain with gait after gentle manual therapy to promote medial rotation at L tibia. Pt will benefit from continued skilled physical therapy to  decrease pain, improve strength and function.     Rehab Potential  Good    Clinical Impairments Affecting Rehab Potential  (-) L knee pain, LE weakness, hx of falls; (+) motivated    PT Frequency  2x / week    PT Duration  6 weeks    PT Treatment/Interventions  Aquatic Therapy;Electrical Stimulation;Iontophoresis '4mg'$ /ml Dexamethasone;Gait training;Stair training;Functional mobility training;Therapeutic activities;Therapeutic exercise;Balance training;Neuromuscular re-education;Patient/family education;Manual techniques   electrical stimulation related treatment and ultrasound if appropriate   PT Next Visit Plan  glute, trunk, scapular strengthening, femoral control, balance training, manual techniques, modalities PRN    Consulted and Agree with Plan of Care  Patient       Patient will benefit from skilled therapeutic intervention in order to improve the following deficits and impairments:  Postural dysfunction, Abnormal gait, Decreased balance, Decreased range of motion, Decreased strength, Improper body mechanics  Visit Diagnosis: Left knee pain, unspecified chronicity  Muscle weakness (  generalized)  History of falling  Pain in left ankle and joints of left foot     Problem List Patient Active Problem List   Diagnosis Date Noted  . Itching 08/10/2018  . Varicose veins of bilateral lower extremities with pain 03/30/2018  . Lymphedema 03/30/2018  . Dizziness 02/02/2017  . Situational depression 12/18/2016  . Health maintenance examination 10/23/2016  . Fatigue 08/12/2016  . Essential hypertension 12/17/2015  . Dermatitis of external ear 12/17/2015  . Corn of foot 05/16/2015  . Advanced care planning/counseling discussion 10/30/2014  . Knee pain 08/17/2013  . Oropharyngeal dysphagia 11/15/2012  . Difficulty with family 03/30/2012  . Medicare annual wellness visit, subsequent 03/30/2012  . History of basal cell carcinoma   . Osteoarthritis   . Osteopenia   . Hypothyroidism    . Glaucoma   . Chronic venous insufficiency     Joneen Boers PT, DPT   02/09/2019, 6:01 PM  [facility] [address] [place], [address] Phone: [phone]   Fax:  [phone]  Name: JACKEE GLASNER MRN: [id number] Date of Birth: [birth year]

## 2019-02-09 NOTE — Patient Instructions (Signed)
   Standing forward weight shifting   Standing in front of something sturdy to hold onto for support    Shift your body weight onto your forefeet without lifting your heels   Hold for 5 seconds   Repeat 10 times   Perform 2 sets     Do 2 times per day.

## 2019-02-10 DIAGNOSIS — M25572 Pain in left ankle and joints of left foot: Secondary | ICD-10-CM | POA: Diagnosis not present

## 2019-02-14 ENCOUNTER — Ambulatory Visit: Payer: Medicare Other

## 2019-02-14 DIAGNOSIS — M25562 Pain in left knee: Secondary | ICD-10-CM | POA: Diagnosis not present

## 2019-02-14 DIAGNOSIS — Z9181 History of falling: Secondary | ICD-10-CM

## 2019-02-14 DIAGNOSIS — M6281 Muscle weakness (generalized): Secondary | ICD-10-CM

## 2019-02-14 DIAGNOSIS — M25572 Pain in left ankle and joints of left foot: Secondary | ICD-10-CM | POA: Diagnosis not present

## 2019-02-14 NOTE — Therapy (Signed)
[facility] [address], [place], [address] Phone: [phone]   Fax:  [phone]  Physical Therapy Treatment  Patient Details  Name: Beth Rodgers MRN: [id number] Date of Birth: 12-Feb-1935 Referring Provider (PT): Vickki Hearing, MD   Encounter Date: 02/14/2019  PT End of Session - 02/14/19 1039    Visit Number  18    Number of Visits  29    Date for PT Re-Evaluation  03/09/19    Authorization Type  8    Authorization Time Period  of 10 progress report    PT Start Time  1039    PT Stop Time  1127    PT Time Calculation (min)  48 min    Activity Tolerance  Patient tolerated treatment well    Behavior During Therapy  [facility] for tasks assessed/performed       Past Medical History:  Diagnosis Date  . Cellulitis 08/10/2018  . Chronic venous insufficiency    with varicose veins  . Glaucoma   . History  of basal cell carcinoma    left nare  . History of chicken pox   . Hypothyroid   . Osteoarthritis    back pain, L knee pain  . Osteopenia 06/2009   DEXA 11/2015: T -2.3 hip, -2.2 spine, on longterm bisphosphonate/evista  . Pyogenic granuloma 04/16/2016    Past Surgical History:  Procedure Laterality Date  . BREAST BIOPSY     core  . CATARACT EXTRACTION     bilateral  . DEXA  06/2009   T score Spine: -1.8, hip -2.0  . Foam sclerotherapy Bilateral 09/2015   [place], [place]  . NASAL RECONSTRUCTION  2005   for BCC L nare s/p Mohs  . nuclear stress test  2014   normal in [place]  . RADIOFREQUENCY ABLATION Left 01/2012   remnant L G saphenous vein below knee  . RADIOFREQUENCY ABLATION Right 02/2013   RLE vein ablation   . VEIN LIGATION AND STRIPPING  remote   bilateral G saphenous veins    There were no vitals filed for this visit.  Subjective Assessment - 02/14/19 1039    Subjective  Had a rough time with her L heel. L knee has been behaving. Went to Dr. Delilah Shan on Friday. Was diagnosed  with a heel spur which seems to have grrown. Was prescribed a very strong medication which makes her feel horrible. .. Keflenac?  Husband has been helping her.  Did a lot of sitting down.  2.5/10 L heel when walking.  Feels like a burning sensation. Feels like something is irritated inside. Sleeps well. Uses a blue menthol gel.  Was ok after last session.  L heel bothered her Saturday and Sunday.     Pertinent History  Pt states that she has fallen 2x in the past year, once when she was sitting on a stool, the other time pt tripped on her R foot. Has a difficult time getting up due to weakness.  Nervous about being by herself if she fell. Has been doing her HEP daily.  Pt wears an inflatable machine for her legs.  Pt thinks her cellulitis is gone.  Feels pain lateral knee. Difficulty getting up from the floor because has a hard time kneeling due to knee pain.   Fell 3 times in the last 6 months (once falling off a stool and 2x tripping on her foot, does not remember which foot she tripped with, both falls  when she tripped, pt fell on her L side).  Pain has improved since onset a little over 3 months ago. Unknown method of injury. Pt noticed swelling in L lateral knee before her falls.  Pt also states feeling pain in her neck after working on the computer or cooking.     Patient Stated Goals  Want to be able to get stronger and be able to get up from the floor.     Currently in Pain?  Yes    Pain Score  3    L heel   Pain Onset  More than a month ago                               PT Education - 02/14/19 1050    Education Details  ther-ex, HEP    Person(s) Educated  Patient    Methods  Explanation;Demonstration;Tactile cues;Verbal cues;Handout    Comprehension  Returned demonstration;Verbalized understanding       Objectives  No latex band allergies   MedbridgeAccess Code: [email or web address]    TTP greater trochanter R > L  Worked on improving L heel pain to promote  ability to ambulate and perform standing exercises for her L knee, improve ability to ambulate, improve balance  L heel feels like it is burning.   . Therapeutic exercise    Slump (+) L LE with reproduction of heel pain. Increased symptoms with cervical extension, decreased heel symptoms with thoracic extension.    Seated chin tucks 10x5 seconds for 3 sets to promote upper thoracic extension and decrease extension pressure to neck to hopefully decrease L heel pain  Seated B scapular retraction 10x5 seconds for 3 sets to promote thoracic extension and decrease cervical extension pressure.    Decreased L heel pain to 1/10 with walking after aforementioned exercise    Standing trunk flexion 10x no heel pain initially but demonstrates L knee discomfort.   Increased heel pain to 2/10 after more walking  Standing gentle trunk extension at table 10x3  Decreased L heel pain with walking  Reviewed and given as part of her HEP. Pt demonstrated and verbalized understanding. Handout provided.   Seated thoracic extension over chair 10x3 with 5 seconds    Improved exercise technique, movement at target joints, use of target muscles after mod verbal, visual, tactile cues.    Reproduced L heel symptoms with cervical extension during slump test as well as with standing repeated flexion test. Decreased L heel pain when walking with decreasing cervical extension pressure as well as promoting gentle lumbar extension. Pt also seems to be improving with L knee pain based on her subjective report. Pt states L knee and L heel both feel better after session. Pt will benefit from continued skilled physical therapy services to decrease pain, improve strength and function such as walking.      PT Short Term Goals - 01/26/19 1416      PT SHORT TERM GOAL #1   Title  Patient will be independent with her HEP to improve strength, decrease L knee pain, improve function.     Baseline  Pt performing her  exercises at home per subjective reports (01/05/2019), (01/26/2019)    Time  3    Period  Weeks    Status  Achieved    Target Date  12/22/18        PT Long Term Goals - 01/26/19 1416      PT  LONG TERM GOAL #1   Title  Patient will have a decrease in L knee pain to 3/10 or less at worst to promote ability to perform standing tasks such as stair negotiation more comfortably.     Baseline  7/10 L knee pain at worst for the past 3 months (11/29/2018); 4/10 at most for the past 7 days (01/05/2019); 3/10 L knee pain at most for the past 7 days (01/09/2019), (01/26/2019)    Time  8    Period  Weeks    Status  Achieved    Target Date  01/26/19      PT LONG TERM GOAL #2   Title  Patient will improve bilateral LE strength by at least 1/2 MMT grade to promote ability to perform functional tasks such as stair negotiation as well as floor to stand transfers.     Time  6    Period  Weeks    Status  Partially Met    Target Date  03/09/19      PT LONG TERM GOAL #3   Title  Patient will be able to perform floor to stand transfer with mod independent to promote ability to get up from the floor if patient falls (pt goal).     Baseline  Pt needs assist to get up from the floor after a fall per subjective reports (11/29/2018). Pt needs assist with floor to stand transfers (01/26/2019)    Time  6    Period  Weeks    Status  On-going    Target Date  03/09/19      PT LONG TERM GOAL #4   Title  Patient will improve her DGI score to at least 19/24 as a demonstration of decreased fall risk.     Baseline  15/24 (11/29/2018); 18/24 (01/09/2019); deferred secondary to L heel pain (01/26/2019)    Time  6    Period  Weeks    Status  Deferred    Target Date  03/09/19      PT LONG TERM GOAL #5   Title  Patient will have a decrease in L heel pain to 3/10 or less at worst to promote ability to ambulate more comfortably.     Baseline  at least 7/10 L heel pain at worst (01/26/2019)    Time  6    Period  Weeks    Status   New    Target Date  03/09/19            Plan - 02/14/19 1053    Clinical Impression Statement  Reproduced L heel symptoms with cervical extension during slump test as well as with standing repeated flexion test. Decreased L heel pain when walking with decreasing cervical extension pressure as well as promoting gentle lumbar extension. Pt also seems to be improving with L knee pain based on her subjective report. Pt states L knee and L heel both feel better after session. Pt will benefit from continued skilled physical therapy services to decrease pain, improve strength and function such as walking.     Rehab Potential  Good    Clinical Impairments Affecting Rehab Potential  (-) L knee pain, LE weakness, hx of falls; (+) motivated    PT Frequency  2x / week    PT Duration  6 weeks    PT Treatment/Interventions  Aquatic Therapy;Electrical Stimulation;Iontophoresis 45m/ml Dexamethasone;Gait training;Stair training;Functional mobility training;Therapeutic activities;Therapeutic exercise;Balance training;Neuromuscular re-education;Patient/family education;Manual techniques   electrical stimulation related treatment and ultrasound if appropriate  PT Next Visit Plan  glute, trunk, scapular strengthening, femoral control, balance training, manual techniques, modalities PRN    Consulted and Agree with Plan of Care  Patient       Patient will benefit from skilled therapeutic intervention in order to improve the following deficits and impairments:  Postural dysfunction, Abnormal gait, Decreased balance, Decreased range of motion, Decreased strength, Improper body mechanics  Visit Diagnosis: Left knee pain, unspecified chronicity  Muscle weakness (generalized)  History of falling  Pain in left ankle and joints of left foot     Problem List Patient Active Problem List   Diagnosis Date Noted  . Itching 08/10/2018  . Varicose veins of bilateral lower extremities with pain 03/30/2018  .  Lymphedema 03/30/2018  . Dizziness 02/02/2017  . Situational depression 12/18/2016  . Health maintenance examination 10/23/2016  . Fatigue 08/12/2016  . Essential hypertension 12/17/2015  . Dermatitis of external ear 12/17/2015  . Corn of foot 05/16/2015  . Advanced care planning/counseling discussion 10/30/2014  . Knee pain 08/17/2013  . Oropharyngeal dysphagia 11/15/2012  . Difficulty with family 03/30/2012  . Medicare annual wellness visit, subsequent 03/30/2012  . History of basal cell carcinoma   . Osteoarthritis   . Osteopenia   . Hypothyroidism   . Glaucoma   . Chronic venous insufficiency     Joneen Boers PT, DPT   02/14/2019, 11:48 AM  [facility] [address], [place], [address] Phone: [phone]   Fax:  [phone]  Name: Beth Rodgers MRN: [id number] Date of Birth: May 10, 1935

## 2019-02-14 NOTE — Patient Instructions (Signed)
   MedbridgeAccess Code: [email or web address]  Standing Lumbar Extension with Counter  10x3

## 2019-02-16 ENCOUNTER — Ambulatory Visit: Payer: Medicare Other

## 2019-02-16 DIAGNOSIS — M25562 Pain in left knee: Secondary | ICD-10-CM | POA: Diagnosis not present

## 2019-02-16 DIAGNOSIS — M6281 Muscle weakness (generalized): Secondary | ICD-10-CM

## 2019-02-16 DIAGNOSIS — Z9181 History of falling: Secondary | ICD-10-CM

## 2019-02-16 DIAGNOSIS — M25572 Pain in left ankle and joints of left foot: Secondary | ICD-10-CM

## 2019-02-16 NOTE — Therapy (Signed)
[facility] [address] [place], [address] Phone: [phone]   Fax:  [phone]  Physical Therapy Treatment  Patient Details  Name: Beth Rodgers MRN: [id number] Date of Birth: 06-22-1935 Referring Provider (PT): Vickki Hearing, MD   Encounter Date: 02/16/2019  PT End of Session - 02/16/19 1123    Visit Number  19    Number of Visits  29    Date for PT Re-Evaluation  03/09/19    Authorization Type  9    Authorization Time Period  of 10 progress report    PT Start Time  1123   pt arrived late   PT Stop Time  1207    PT Time Calculation (min)  44 min    Activity Tolerance  Patient tolerated treatment well    Behavior During Therapy  [facility] for tasks assessed/performed       Past Medical History:  Diagnosis Date  . Cellulitis 08/10/2018  . Chronic venous insufficiency    with varicose veins  . Glaucoma   . History  of basal cell carcinoma    left nare  . History of chicken pox   . Hypothyroid   . Osteoarthritis    back pain, L knee pain  . Osteopenia 06/2009   DEXA 11/2015: T -2.3 hip, -2.2 spine, on longterm bisphosphonate/evista  . Pyogenic granuloma 04/16/2016    Past Surgical History:  Procedure Laterality Date  . BREAST BIOPSY     core  . CATARACT EXTRACTION     bilateral  . DEXA  06/2009   T score Spine: -1.8, hip -2.0  . Foam sclerotherapy Bilateral 09/2015   [place], [place]  . NASAL RECONSTRUCTION  2005   for BCC L nare s/p Mohs  . nuclear stress test  2014   normal in [place]  . RADIOFREQUENCY ABLATION Left 01/2012   remnant L G saphenous vein below knee  . RADIOFREQUENCY ABLATION Right 02/2013   RLE vein ablation   . VEIN LIGATION AND STRIPPING  remote   bilateral G saphenous veins    There were no vitals filed for this visit.  Subjective Assessment - 02/16/19 1123    Subjective  L heel has been no more than 1.5/10 yesterday and today. Limps to protect it as well.  L  knee has improved a great deal.  Still has a little tenderness (anterior medial proximal L tibia). The L knee pain has improved 70%. No L knee pain when walking. Just when she presses on it.  Feels like she is more stable when standing.  Used to feel unsteady when standing.     Pertinent History  Pt states that she has fallen 2x in the past year, once when she was sitting on a stool, the other time pt tripped on her R foot. Has a difficult time getting up due to weakness.  Nervous about being by herself if she fell. Has been doing her HEP daily.  Pt wears an inflatable machine for her legs.  Pt thinks her cellulitis is gone.  Feels pain lateral knee. Difficulty getting up from the floor because has a hard time kneeling due to knee pain.   Fell 3 times in the last 6 months (once falling off a stool and 2x tripping on her foot, does not remember which foot she tripped with, both falls when she tripped, pt fell on her L side).  Pain has improved since onset a little over 3 months  ago. Unknown method of injury. Pt noticed swelling in L lateral knee before her falls.  Pt also states feeling pain in her neck after working on the computer or cooking.     Patient Stated Goals  Want to be able to get stronger and be able to get up from the floor.     Currently in Pain?  Yes    Pain Score  2    1.5/10 L heel pain   Pain Onset  More than a month ago                               PT Education - 02/16/19 1129    Education Details  ther-ex    Person(s) Educated  Patient    Methods  Explanation;Demonstration;Tactile cues;Verbal cues    Comprehension  Returned demonstration;Verbalized understanding         Objectives  No latex band allergies   MedbridgeAccess Code: [email or web address]    TTP greater trochanter R > L  Worked on improving L heel pain to promote ability to ambulate and perform standing exercises for her L knee, improve ability to ambulate, improve balance  L heel  feels like it is burning.  . Therapeutic exercise     Seated thoracic extension over chair 10x3 with 5 seconds  Seated chin tucks 10x5 seconds for 3 sets to promote upper thoracic extension and decrease extension pressure to neck to hopefully decrease L heel pain  Standing gentle trunk extension at table 10x3  Standing B scapular retraction resisting yellow band 10x3 with 5 second holds  Side stepping 32 ft to the R and 32 ft to the L   Did not notice any heel pain.   Forward wedding march 32 ft  Forwards step up onto regular step with B UE assist   4x. L knee discomfort at 4th repetition   Seated hip adduction physioball and glute max squeeze 10x5 seconds.   No L knee pain afterwards.              Improved exercise technique, movement at target joints, use of target muscles after mod verbal, visual, tactile cues.    Continued working on improving upper thoracic extension to decrease extension pressure to her neck as well as promoting gentle extension to her low back to overall decrease pressure to nerves going down to her L heel. No complain of L heel pain towards end of session. Pt also demonstrates overall improved L knee pain based subjective reports as well as through clinical observation. Good glute muscle use felt with exercises. Pt tolerated session well without aggravation of symptoms. Pt will benefit from continued skilled physical therapy services to decrease pain, improve strength and function.      PT Short Term Goals - 01/26/19 1416      PT SHORT TERM GOAL #1   Title  Patient will be independent with her HEP to improve strength, decrease L knee pain, improve function.     Baseline  Pt performing her exercises at home per subjective reports (01/05/2019), (01/26/2019)    Time  3    Period  Weeks    Status  Achieved    Target Date  12/22/18        PT Long Term Goals - 01/26/19 1416      PT LONG TERM GOAL #1   Title  Patient will have a decrease  in L knee pain to 3/10 or  less at worst to promote ability to perform standing tasks such as stair negotiation more comfortably.     Baseline  7/10 L knee pain at worst for the past 3 months (11/29/2018); 4/10 at most for the past 7 days (01/05/2019); 3/10 L knee pain at most for the past 7 days (01/09/2019), (01/26/2019)    Time  8    Period  Weeks    Status  Achieved    Target Date  01/26/19      PT LONG TERM GOAL #2   Title  Patient will improve bilateral LE strength by at least 1/2 MMT grade to promote ability to perform functional tasks such as stair negotiation as well as floor to stand transfers.     Time  6    Period  Weeks    Status  Partially Met    Target Date  03/09/19      PT LONG TERM GOAL #3   Title  Patient will be able to perform floor to stand transfer with mod independent to promote ability to get up from the floor if patient falls (pt goal).     Baseline  Pt needs assist to get up from the floor after a fall per subjective reports (11/29/2018). Pt needs assist with floor to stand transfers (01/26/2019)    Time  6    Period  Weeks    Status  On-going    Target Date  03/09/19      PT LONG TERM GOAL #4   Title  Patient will improve her DGI score to at least 19/24 as a demonstration of decreased fall risk.     Baseline  15/24 (11/29/2018); 18/24 (01/09/2019); deferred secondary to L heel pain (01/26/2019)    Time  6    Period  Weeks    Status  Deferred    Target Date  03/09/19      PT LONG TERM GOAL #5   Title  Patient will have a decrease in L heel pain to 3/10 or less at worst to promote ability to ambulate more comfortably.     Baseline  at least 7/10 L heel pain at worst (01/26/2019)    Time  6    Period  Weeks    Status  New    Target Date  03/09/19            Plan - 02/16/19 1129    Clinical Impression Statement  Continued working on improving upper thoracic extension to decrease extension pressure to her neck as well as promoting gentle extension to her low  back to overall decrease pressure to nerves going down to her L heel. No complain of L heel pain towards end of session. Pt also demonstrates overall improved L knee pain based subjective reports as well as through clinical observation. Good glute muscle use felt with exercises. Pt tolerated session well without aggravation of symptoms. Pt will benefit from continued skilled physical therapy services to decrease pain, improve strength and function.     Rehab Potential  Good    Clinical Impairments Affecting Rehab Potential  (-) L knee pain, LE weakness, hx of falls; (+) motivated    PT Frequency  2x / week    PT Duration  6 weeks    PT Treatment/Interventions  Aquatic Therapy;Electrical Stimulation;Iontophoresis '4mg'$ /ml Dexamethasone;Gait training;Stair training;Functional mobility training;Therapeutic activities;Therapeutic exercise;Balance training;Neuromuscular re-education;Patient/family education;Manual techniques   electrical stimulation related treatment and ultrasound if appropriate   PT Next Visit Plan  glute, trunk,  scapular strengthening, femoral control, balance training, manual techniques, modalities PRN    Consulted and Agree with Plan of Care  Patient       Patient will benefit from skilled therapeutic intervention in order to improve the following deficits and impairments:  Postural dysfunction, Abnormal gait, Decreased balance, Decreased range of motion, Decreased strength, Improper body mechanics  Visit Diagnosis: Left knee pain, unspecified chronicity  Muscle weakness (generalized)  History of falling  Pain in left ankle and joints of left foot     Problem List Patient Active Problem List   Diagnosis Date Noted  . Itching 08/10/2018  . Varicose veins of bilateral lower extremities with pain 03/30/2018  . Lymphedema 03/30/2018  . Dizziness 02/02/2017  . Situational depression 12/18/2016  . Health maintenance examination 10/23/2016  . Fatigue 08/12/2016  .  Essential hypertension 12/17/2015  . Dermatitis of external ear 12/17/2015  . Corn of foot 05/16/2015  . Advanced care planning/counseling discussion 10/30/2014  . Knee pain 08/17/2013  . Oropharyngeal dysphagia 11/15/2012  . Difficulty with family 03/30/2012  . Medicare annual wellness visit, subsequent 03/30/2012  . History of basal cell carcinoma   . Osteoarthritis   . Osteopenia   . Hypothyroidism   . Glaucoma   . Chronic venous insufficiency     Joneen Boers PT, DPT   02/16/2019, 12:43 PM  [facility] [address] [place], [address] Phone: [phone]   Fax:  [phone]  Name: Beth Rodgers MRN: [id number] Date of Birth: 04-May-1935

## 2019-02-21 ENCOUNTER — Ambulatory Visit (INDEPENDENT_AMBULATORY_CARE_PROVIDER_SITE_OTHER): Payer: Medicare Other | Admitting: Psychology

## 2019-02-21 ENCOUNTER — Ambulatory Visit: Payer: Medicare Other

## 2019-02-21 DIAGNOSIS — F4323 Adjustment disorder with mixed anxiety and depressed mood: Secondary | ICD-10-CM | POA: Diagnosis not present

## 2019-02-21 DIAGNOSIS — M25572 Pain in left ankle and joints of left foot: Secondary | ICD-10-CM | POA: Diagnosis not present

## 2019-02-21 DIAGNOSIS — Z9181 History of falling: Secondary | ICD-10-CM

## 2019-02-21 DIAGNOSIS — M25562 Pain in left knee: Secondary | ICD-10-CM | POA: Diagnosis not present

## 2019-02-21 DIAGNOSIS — M6281 Muscle weakness (generalized): Secondary | ICD-10-CM | POA: Diagnosis not present

## 2019-02-21 NOTE — Therapy (Signed)
[facility] [address] [place], [address] Phone: [phone]   Fax:  [phone]  Physical Therapy Treatment And Progress Report (01/11/2019 - 02/21/2019)  Patient Details  Name: Beth Rodgers MRN: [id number] Date of Birth: December 14, 1935 Referring Provider (PT): Vickki Hearing, MD   Encounter Date: 02/21/2019  PT End of Session - 02/21/19 0906    Visit Number  20    Number of Visits  29    Date for PT Re-Evaluation  03/09/19    Authorization Type  10    Authorization Time Period  of 10 progress report    PT Start Time  0906    PT Stop Time  1002    PT Time Calculation (min)  56 min    Activity Tolerance  Patient tolerated treatment well    Behavior During Therapy  [facility] for tasks assessed/performed       Past Medical History:  Diagnosis Date  . Cellulitis 08/10/2018  . Chronic venous insufficiency    with varicose veins  . Glaucoma   . History  of basal cell carcinoma    left nare  . History of chicken pox   . Hypothyroid   . Osteoarthritis    back pain, L knee pain  . Osteopenia 06/2009   DEXA 11/2015: T -2.3 hip, -2.2 spine, on longterm bisphosphonate/evista  . Pyogenic granuloma 04/16/2016    Past Surgical History:  Procedure Laterality Date  . BREAST BIOPSY     core  . CATARACT EXTRACTION     bilateral  . DEXA  06/2009   T score Spine: -1.8, hip -2.0  . Foam sclerotherapy Bilateral 09/2015   [place], [place]  . NASAL RECONSTRUCTION  2005   for BCC L nare s/p Mohs  . nuclear stress test  2014   normal in [place]  . RADIOFREQUENCY ABLATION Left 01/2012   remnant L G saphenous vein below knee  . RADIOFREQUENCY ABLATION Right 02/2013   RLE vein ablation   . VEIN LIGATION AND STRIPPING  remote   bilateral G saphenous veins    There were no vitals filed for this visit.  Subjective Assessment - 02/21/19 0907    Subjective  L heel is about 1/10. Has been not trying to stress it.  Doing a lot of things sitting down. Has been taking Celebrex for inflammation.  L knee is tender when she touches it. No pain when walking. The tenderness is 5x less than when she first started PT.  She feels like she is on the mend, when she is careful.  Trying to let her heal.  1.5/10 L knee pain at worst for the past 10 days.  4/10 L heel pain for the past 7 days. Pt states that her R eye has been red since this weekend, might be due to possibility of a tiny drop of Clorox cleaner or vapor might have come in contact with her R eye.        Pertinent History  Pt states that she has fallen 2x in the past year, once when she was sitting on a stool, the other time pt tripped on her R foot. Has a difficult time getting up due to weakness.  Nervous about being by herself if she fell. Has been doing her HEP daily.  Pt wears an inflatable machine for her legs.  Pt thinks her cellulitis is gone.  Feels pain lateral knee. Difficulty getting up from the floor because has  a hard time kneeling due to knee pain.   Fell 3 times in the last 6 months (once falling off a stool and 2x tripping on her foot, does not remember which foot she tripped with, both falls when she tripped, pt fell on her L side).  Pain has improved since onset a little over 3 months ago. Unknown method of injury. Pt noticed swelling in L lateral knee before her falls.  Pt also states feeling pain in her neck after working on the computer or cooking.     Patient Stated Goals  Want to be able to get stronger and be able to get up from the floor.     Currently in Pain?  Yes    Pain Score  1    L heel.    Pain Onset  More than a month ago         [facility] PT Assessment - 02/21/19 0921      Strength   Overall Strength Comments  pt holding onto table with seated MMT     Right Hip Flexion  4+/5    Right Hip Extension  5/5   seated manually resisted hip extension   Right Hip ABduction  3+/5    Left Hip Flexion  4/5    Left Hip Extension  5/5    seated manually resisted hip extension   Left Hip ABduction  4-/5    Right Knee Flexion  4/5    Right Knee Extension  4+/5   with medial hamstring area pain   Left Knee Flexion  4/5    Left Knee Extension  4+/5   with medial hamstring area pain     Dynamic Gait Index   Level Surface  Mild Impairment    Change in Gait Speed  Mild Impairment    Gait with Horizontal Head Turns  Mild Impairment   decreased steadiness with L cervical rotation   Gait with Vertical Head Turns  Mild Impairment   decreased steadiness with cervical extension   Gait and Pivot Turn  Normal    Step Over Obstacle  Normal    Step Around Obstacles  Normal    Steps  Mild Impairment    Total Score  19    DGI comment:  < 19 suggests increased fall risk                           PT Education - 02/21/19 1031    Education Details  ther-ex. HEP    Person(s) Educated  Patient    Methods  Explanation;Demonstration;Tactile cues;Verbal cues    Comprehension  Returned demonstration;Verbalized understanding      Objectives  No latex band allergies   MedbridgeAccess Code: [email or web address]     TTP greater trochanter R > L  Worked on improving L heel pain to promote ability to ambulate and perform standing exercises for her L knee, improve ability to ambulate, improve balance  L heel feels like it is burning.  .   Gait  Directed patient with gait with normal gait speed, with changes in speed, 180 degree pivot turn, with R and L cervical rotation position, with cervical flexion and extension position, stepping around obstacles, stepping over an obstacle, ascending and descending 4 regular steps with UE assist   Reviewed progress/curent status with DGI with pt  Therapeutic exercise   seated manually resisted hip flexion, knee flexion, knee extension, hip extension, S/L hip abduction 1-2x each way  for each LE   Reviewed progress/current status with strength with pt.   Blood  pressure L arm sitting, mechanically taken, normal cuff 133/64, HR 72 secondary to eye redness.   (+) SLR L (medial thigh discomfort with hip adduction and cervical flexion) and R LE  Supine bridge 3 x.   B hamstring cramps  Reclined position  Naval ins 10x5 seconds for 3 sets   Reviewed and given as part of her HEP. Pt demonstrated and verbalized understanding.    Transversus abdominis contraction with   Knee extension     R knee 10x2    L knee 10x2    Reviewed and given as part of her HEP. Pt demonstrated and verbalized understanding.    Improved exercise technique, movement at target joints, use of target muscles after mod verbal, visual, tactile cues.   Pt demonstrates trunk and hip weakness as well as decreased trunk and pelvic control observed in the clinic. Pt also demonstrates LE neural tension (with pt complain of L medial thigh discomfort near the area of L medial hamstring discomfort while pt performed seated L knee extension MMT earier) during the SLR test. Pt also demonstrates decreased knee flexion and extension strength since last measured. Pt however demonstrates improved DGI suggesting decreased fall risk and decreased L knee and L heel pain since last progress report. Pt making good progress with balance, and pain goals. Pt still demonstrates B LE weakness, L knee and heel pain, difficulty with floor to stand transfers, and difficulty performing functional tasks and would benefit from continued skilled physical therapy services to address the aforementioned deficits.        PT Short Term Goals - 01/26/19 1416      PT SHORT TERM GOAL #1   Title  Patient will be independent with her HEP to improve strength, decrease L knee pain, improve function.     Baseline  Pt performing her exercises at home per subjective reports (01/05/2019), (01/26/2019)    Time  3    Period  Weeks    Status  Achieved    Target Date  12/22/18        PT Long Term Goals - 02/21/19 1033       PT LONG TERM GOAL #1   Title  Patient will have a decrease in L knee pain to 3/10 or less at worst to promote ability to perform standing tasks such as stair negotiation more comfortably.     Baseline  7/10 L knee pain at worst for the past 3 months (11/29/2018); 4/10 at most for the past 7 days (01/05/2019); 3/10 L knee pain at most for the past 7 days (01/09/2019), (01/26/2019); 1.5/10 L knee pain at most for the past 10 days (02/21/2019)    Time  8    Period  Weeks    Status  Achieved    Target Date  01/26/19      PT LONG TERM GOAL #2   Title  Patient will improve bilateral LE strength by at least 1/2 MMT grade to promote ability to perform functional tasks such as stair negotiation as well as floor to stand transfers.     Time  6    Period  Weeks    Status  Partially Met    Target Date  03/09/19      PT LONG TERM GOAL #3   Title  Patient will be able to perform floor to stand transfer with mod independent to promote ability to get  up from the floor if patient falls (pt goal).     Baseline  Pt needs assist to get up from the floor after a fall per subjective reports (11/29/2018). Pt needs assist with floor to stand transfers (01/26/2019)    Time  6    Period  Weeks    Status  Deferred    Target Date  03/09/19      PT LONG TERM GOAL #4   Title  Patient will improve her DGI score to at least 19/24 as a demonstration of decreased fall risk.     Baseline  15/24 (11/29/2018); 18/24 (01/09/2019); deferred secondary to L heel pain (01/26/2019); 19/24 (02/21/2019)    Time  6    Period  Weeks    Status  Achieved    Target Date  03/09/19      PT LONG TERM GOAL #5   Title  Patient will have a decrease in L heel pain to 3/10 or less at worst to promote ability to ambulate more comfortably.     Baseline  at least 7/10 L heel pain at worst (01/26/2019); 4/10 L heel pain at most for the past 7 days (02/21/2019)    Time  6    Period  Weeks    Status  Partially Met    Target Date  03/09/19             Plan - 02/21/19 0905    Clinical Impression Statement  Pt demonstrates trunk and hip weakness as well as decreased trunk and pelvic control observed in the clinic. Pt also demonstrates LE neural tension (with pt complain of L medial thigh discomfort near the area of L medial hamstring discomfort while pt performed seated L knee extension MMT earier) during the SLR test. Pt also demonstrates decreased knee flexion and extension strength since last measured. Pt however demonstrates improved DGI suggesting decreased fall risk and decreased L knee and L heel pain since last progress report. Pt making good progress with balance, and pain goals. Pt still demonstrates B LE weakness, L knee and heel pain, difficulty with floor to stand transfers, and difficulty performing functional tasks and would benefit from continued skilled physical therapy services to address the aforementioned deficits.     History and Personal Factors relevant to plan of care:  L knee pain, LE weakness, hx of multiple falls, age, L heel pain    Clinical Presentation  Stable    Clinical Presentation due to:  decreasing L knee and heel pain, improved DGI score for balance.     Clinical Decision Making  Low    Rehab Potential  Good    Clinical Impairments Affecting Rehab Potential  (-) L knee pain, LE weakness, hx of falls; (+) motivated    PT Frequency  2x / week    PT Duration  6 weeks    PT Treatment/Interventions  Aquatic Therapy;Electrical Stimulation;Iontophoresis '4mg'$ /ml Dexamethasone;Gait training;Stair training;Functional mobility training;Therapeutic activities;Therapeutic exercise;Balance training;Neuromuscular re-education;Patient/family education;Manual techniques   electrical stimulation related treatment and ultrasound if appropriate   PT Next Visit Plan  glute, trunk, scapular strengthening, femoral control, balance training, manual techniques, modalities PRN    Consulted and Agree with Plan of Care  Patient        Patient will benefit from skilled therapeutic intervention in order to improve the following deficits and impairments:  Postural dysfunction, Abnormal gait, Decreased balance, Decreased range of motion, Decreased strength, Improper body mechanics  Visit Diagnosis: Left knee pain, unspecified chronicity  Muscle  weakness (generalized)  History of falling  Pain in left ankle and joints of left foot     Problem List Patient Active Problem List   Diagnosis Date Noted  . Itching 08/10/2018  . Varicose veins of bilateral lower extremities with pain 03/30/2018  . Lymphedema 03/30/2018  . Dizziness 02/02/2017  . Situational depression 12/18/2016  . Health maintenance examination 10/23/2016  . Fatigue 08/12/2016  . Essential hypertension 12/17/2015  . Dermatitis of external ear 12/17/2015  . Corn of foot 05/16/2015  . Advanced care planning/counseling discussion 10/30/2014  . Knee pain 08/17/2013  . Oropharyngeal dysphagia 11/15/2012  . Difficulty with family 03/30/2012  . Medicare annual wellness visit, subsequent 03/30/2012  . History of basal cell carcinoma   . Osteoarthritis   . Osteopenia   . Hypothyroidism   . Glaucoma   . Chronic venous insufficiency     Thank you for your referral.  Joneen Boers PT, DPT   02/21/2019, 3:50 PM  [facility] [address], [place], [address] Phone: [phone]   Fax:  [phone]  Name: Beth Rodgers MRN: [id number] Date of Birth: March 13, 1935

## 2019-02-21 NOTE — Patient Instructions (Addendum)
MedbridgeAccess Code: [email or web address]   Seated Transversus Abdominis Bracing  10x3 with 5 second holds       Transversus abdominis contraction with knee extension in reclined position 10x3 each LE. Reviewed and given as part of her HEP. Pt demonstrated and verbalized understanding.

## 2019-02-23 ENCOUNTER — Ambulatory Visit: Payer: Medicare Other

## 2019-02-23 DIAGNOSIS — M25562 Pain in left knee: Secondary | ICD-10-CM

## 2019-02-23 DIAGNOSIS — M25572 Pain in left ankle and joints of left foot: Secondary | ICD-10-CM | POA: Diagnosis not present

## 2019-02-23 DIAGNOSIS — M6281 Muscle weakness (generalized): Secondary | ICD-10-CM | POA: Diagnosis not present

## 2019-02-23 DIAGNOSIS — Z9181 History of falling: Secondary | ICD-10-CM

## 2019-02-23 NOTE — Therapy (Signed)
[facility] [address], [place], [address] Phone: [phone]   Fax:  [phone]  Physical Therapy Treatment  Patient Details  Name: Beth Rodgers MRN: [id number] Date of Birth: 1935/02/14 Referring Provider (PT): Vickki Hearing, MD   Encounter Date: 02/23/2019  PT End of Session - 02/23/19 1035    Visit Number  21    Number of Visits  29    Date for PT Re-Evaluation  03/09/19    Authorization Type  1    Authorization Time Period  of 10 progress report    PT Start Time  1035    PT Stop Time  1119    PT Time Calculation (min)  44 min    Activity Tolerance  Patient tolerated treatment well    Behavior During Therapy  [facility] for tasks assessed/performed       Past Medical History:  Diagnosis Date  . Cellulitis 08/10/2018  . Chronic venous insufficiency    with varicose veins  . Glaucoma   . History  of basal cell carcinoma    left nare  . History of chicken pox   . Hypothyroid   . Osteoarthritis    back pain, L knee pain  . Osteopenia 06/2009   DEXA 11/2015: T -2.3 hip, -2.2 spine, on longterm bisphosphonate/evista  . Pyogenic granuloma 04/16/2016    Past Surgical History:  Procedure Laterality Date  . BREAST BIOPSY     core  . CATARACT EXTRACTION     bilateral  . DEXA  06/2009   T score Spine: -1.8, hip -2.0  . Foam sclerotherapy Bilateral 09/2015   [place], [place]  . NASAL RECONSTRUCTION  2005   for BCC L nare s/p Mohs  . nuclear stress test  2014   normal in [place]  . RADIOFREQUENCY ABLATION Left 01/2012   remnant L G saphenous vein below knee  . RADIOFREQUENCY ABLATION Right 02/2013   RLE vein ablation   . VEIN LIGATION AND STRIPPING  remote   bilateral G saphenous veins    There were no vitals filed for this visit.  Subjective Assessment - 02/23/19 1036    Subjective  L knee is 1/10, L heel is 1.5/10 currently. Has been off her feet this past 2 days which has helped. Felt  a pull L lateral calf this morning.     Pertinent History  Pt states that she has fallen 2x in the past year, once when she was sitting on a stool, the other time pt tripped on her R foot. Has a difficult time getting up due to weakness.  Nervous about being by herself if she fell. Has been doing her HEP daily.  Pt wears an inflatable machine for her legs.  Pt thinks her cellulitis is gone.  Feels pain lateral knee. Difficulty getting up from the floor because has a hard time kneeling due to knee pain.   Fell 3 times in the last 6 months (once falling off a stool and 2x tripping on her foot, does not remember which foot she tripped with, both falls when she tripped, pt fell on her L side).  Pain has improved since onset a little over 3 months ago. Unknown method of injury. Pt noticed swelling in L lateral knee before her falls.  Pt also states feeling pain in her neck after working on the computer or cooking.     Patient Stated Goals  Want to be able to get stronger  and be able to get up from the floor.     Currently in Pain?  Yes    Pain Score  --   1.5/10 L heel   Pain Onset  More than a month ago                               PT Education - 02/23/19 1044    Education Details  ther-ex    [employer]) Educated  Patient    Methods  Explanation;Demonstration;Tactile cues;Verbal cues    Comprehension  Returned demonstration;Verbalized understanding        Objectives   Therapeutic exercise   Seated transversus contraction  10x5 seconds    Then with B shoulder extension isometrics, hands on lap 5x5 seconds for 3 sets  Sitting with upright posture  Manually resisted trunk perturbation by PT 1 minute x 3  Weak trunk strength   Worked on trunk strengthening today to help decrease neural component of L heel pain  Standing ankle DF/PF on rocker board 2 minutes   Decreased L heel pain with gait   Seated manually resisted hip extension from end range hip flexion to  promote ability to perform floor to stand transfer  L 4x. R low back pain.   R 10x3. No R low back pain afterwards   LAQ 3 lbs to promote quad strength for floor to stand transfers   R 10x, then 3x. L hamstring cramp which eases with upright posture  L 10x2, then 3x   Seated R hip extension isometrics 20x5 second holds  Improved exercise technique, movement at target joints, use of target muscles after mod verbal, visual, tactile cues.     Response to treatment Decreased L heel pain after session. No L knee pain with gait.    Clinical Impression R low back pain with R low back extension, L heel and hamstrings symptoms with L low back flexion. Worked on R glute max strengthening to promote better posture of low back/pelvis area. Decreased R low back pain after R glute strengthening. Decreased L heel pain after session. No L knee pain with gait.  Pt will benefit from continued skilled physical therapy services to decrease pain, improve strength and function.            PT Short Term Goals - 01/26/19 1416      PT SHORT TERM GOAL #1   Title  Patient will be independent with her HEP to improve strength, decrease L knee pain, improve function.     Baseline  Pt performing her exercises at home per subjective reports (01/05/2019), (01/26/2019)    Time  3    Period  Weeks    Status  Achieved    Target Date  12/22/18        PT Long Term Goals - 02/21/19 1033      PT LONG TERM GOAL #1   Title  Patient will have a decrease in L knee pain to 3/10 or less at worst to promote ability to perform standing tasks such as stair negotiation more comfortably.     Baseline  7/10 L knee pain at worst for the past 3 months (11/29/2018); 4/10 at most for the past 7 days (01/05/2019); 3/10 L knee pain at most for the past 7 days (01/09/2019), (01/26/2019); 1.5/10 L knee pain at most for the past 10 days (02/21/2019)    Time  8    Period  Weeks  Status  Achieved    Target Date  01/26/19      PT  LONG TERM GOAL #2   Title  Patient will improve bilateral LE strength by at least 1/2 MMT grade to promote ability to perform functional tasks such as stair negotiation as well as floor to stand transfers.     Time  6    Period  Weeks    Status  Partially Met    Target Date  03/09/19      PT LONG TERM GOAL #3   Title  Patient will be able to perform floor to stand transfer with mod independent to promote ability to get up from the floor if patient falls (pt goal).     Baseline  Pt needs assist to get up from the floor after a fall per subjective reports (11/29/2018). Pt needs assist with floor to stand transfers (01/26/2019)    Time  6    Period  Weeks    Status  Deferred    Target Date  03/09/19      PT LONG TERM GOAL #4   Title  Patient will improve her DGI score to at least 19/24 as a demonstration of decreased fall risk.     Baseline  15/24 (11/29/2018); 18/24 (01/09/2019); deferred secondary to L heel pain (01/26/2019); 19/24 (02/21/2019)    Time  6    Period  Weeks    Status  Achieved    Target Date  03/09/19      PT LONG TERM GOAL #5   Title  Patient will have a decrease in L heel pain to 3/10 or less at worst to promote ability to ambulate more comfortably.     Baseline  at least 7/10 L heel pain at worst (01/26/2019); 4/10 L heel pain at most for the past 7 days (02/21/2019)    Time  6    Period  Weeks    Status  Partially Met    Target Date  03/09/19            Plan - 02/23/19 1538    Clinical Impression Statement  R low back pain with R low back extension, L heel and hamstrings symptoms with L low back flexion. Worked on R glute max strengthening to promote better posture of low back/pelvis area. Decreased R low back pain after R glute strengthening. Decreased L heel pain after session. No L knee pain with gait.  Pt will benefit from continued skilled physical therapy services to decrease pain, improve strength and function.     Rehab Potential  Good    Clinical  Impairments Affecting Rehab Potential  (-) L knee pain, LE weakness, hx of falls; (+) motivated    PT Frequency  2x / week    PT Duration  6 weeks    PT Treatment/Interventions  Aquatic Therapy;Electrical Stimulation;Iontophoresis '4mg'$ /ml Dexamethasone;Gait training;Stair training;Functional mobility training;Therapeutic activities;Therapeutic exercise;Balance training;Neuromuscular re-education;Patient/family education;Manual techniques   electrical stimulation related treatment and ultrasound if appropriate   PT Next Visit Plan  glute, trunk, scapular strengthening, femoral control, balance training, manual techniques, modalities PRN    Consulted and Agree with Plan of Care  Patient       Patient will benefit from skilled therapeutic intervention in order to improve the following deficits and impairments:  Postural dysfunction, Abnormal gait, Decreased balance, Decreased range of motion, Decreased strength, Improper body mechanics  Visit Diagnosis: Left knee pain, unspecified chronicity  Muscle weakness (generalized)  History of falling  Pain  in left ankle and joints of left foot     Problem List Patient Active Problem List   Diagnosis Date Noted  . Itching 08/10/2018  . Varicose veins of bilateral lower extremities with pain 03/30/2018  . Lymphedema 03/30/2018  . Dizziness 02/02/2017  . Situational depression 12/18/2016  . Health maintenance examination 10/23/2016  . Fatigue 08/12/2016  . Essential hypertension 12/17/2015  . Dermatitis of external ear 12/17/2015  . Corn of foot 05/16/2015  . Advanced care planning/counseling discussion 10/30/2014  . Knee pain 08/17/2013  . Oropharyngeal dysphagia 11/15/2012  . Difficulty with family 03/30/2012  . Medicare annual wellness visit, subsequent 03/30/2012  . History of basal cell carcinoma   . Osteoarthritis   . Osteopenia   . Hypothyroidism   . Glaucoma   . Chronic venous insufficiency     Joneen Boers PT,  DPT   02/23/2019, 3:49 PM  [facility] [address] [place], [address] Phone: [phone]   Fax:  [phone]  Name: Beth Rodgers MRN: [id number] Date of Birth: [birth year]

## 2019-02-27 ENCOUNTER — Ambulatory Visit: Payer: Medicare Other | Attending: Sports Medicine

## 2019-02-27 DIAGNOSIS — M6281 Muscle weakness (generalized): Secondary | ICD-10-CM | POA: Diagnosis not present

## 2019-02-27 DIAGNOSIS — Z9181 History of falling: Secondary | ICD-10-CM | POA: Insufficient documentation

## 2019-02-27 DIAGNOSIS — M25562 Pain in left knee: Secondary | ICD-10-CM | POA: Insufficient documentation

## 2019-02-27 DIAGNOSIS — M25572 Pain in left ankle and joints of left foot: Secondary | ICD-10-CM | POA: Insufficient documentation

## 2019-02-27 NOTE — Therapy (Signed)
[facility] [address] [place], [address] Phone: [phone]   Fax:  [phone]  Physical Therapy Treatment  Patient Details  Name: Beth Rodgers MRN: [id number] Date of Birth: 1935-12-11 Referring Provider (PT): Vickki Hearing, MD   Encounter Date: 02/27/2019  PT End of Session - 02/27/19 1305    Visit Number  22    Number of Visits  29    Date for PT Re-Evaluation  03/09/19    Authorization Type  2    Authorization Time Period  of 10 progress report    PT Start Time  1306    PT Stop Time  1345    PT Time Calculation (min)  39 min    Activity Tolerance  Patient tolerated treatment well    Behavior During Therapy  [facility] for tasks assessed/performed       Past Medical History:  Diagnosis Date  . Cellulitis 08/10/2018  . Chronic venous insufficiency    with varicose veins  . Glaucoma   . History  of basal cell carcinoma    left nare  . History of chicken pox   . Hypothyroid   . Osteoarthritis    back pain, L knee pain  . Osteopenia 06/2009   DEXA 11/2015: T -2.3 hip, -2.2 spine, on longterm bisphosphonate/evista  . Pyogenic granuloma 04/16/2016    Past Surgical History:  Procedure Laterality Date  . BREAST BIOPSY     core  . CATARACT EXTRACTION     bilateral  . DEXA  06/2009   T score Spine: -1.8, hip -2.0  . Foam sclerotherapy Bilateral 09/2015   [place], [place]  . NASAL RECONSTRUCTION  2005   for BCC L nare s/p Mohs  . nuclear stress test  2014   normal in [place]  . RADIOFREQUENCY ABLATION Left 01/2012   remnant L G saphenous vein below knee  . RADIOFREQUENCY ABLATION Right 02/2013   RLE vein ablation   . VEIN LIGATION AND STRIPPING  remote   bilateral G saphenous veins    There were no vitals filed for this visit.  Subjective Assessment - 02/27/19 1307    Subjective  The pain in her heel moved laterally. She almost felt normal with the knee and foot today. 1/10 L heel  currently. L knee does not hurt unless she presses on it.     Pertinent History  Pt states that she has fallen 2x in the past year, once when she was sitting on a stool, the other time pt tripped on her R foot. Has a difficult time getting up due to weakness.  Nervous about being by herself if she fell. Has been doing her HEP daily.  Pt wears an inflatable machine for her legs.  Pt thinks her cellulitis is gone.  Feels pain lateral knee. Difficulty getting up from the floor because has a hard time kneeling due to knee pain.   Fell 3 times in the last 6 months (once falling off a stool and 2x tripping on her foot, does not remember which foot she tripped with, both falls when she tripped, pt fell on her L side).  Pain has improved since onset a little over 3 months ago. Unknown method of injury. Pt noticed swelling in L lateral knee before her falls.  Pt also states feeling pain in her neck after working on the computer or cooking.     Patient Stated Goals  Want to be able to  get stronger and be able to get up from the floor.     Currently in Pain?  Yes    Pain Score  1     Pain Onset  More than a month ago                               PT Education - 02/27/19 1309    Education Details  ther-ex    [employer]) Educated  Patient    Methods  Explanation;Demonstration;Tactile cues;Verbal cues    Comprehension  Returned demonstration;Verbalized understanding      Objectives   Manual therapy  Seated STM L lateral hamstrings and L vastus lateralis  Decreased L knee pain with LAQ  Therapeutic exercise    LAQ 3 lbs to promote quad strength for floor to stand transfers  L LE 10x. L knee pain   Then 10x2 after manual therapy. Decreased L knee pain   Seated transversus contraction             10x5 seconds               Then with B shoulder extension isometrics, hands on lap 10x5 seconds for 2 sets  Sitting with upright posture             Manually resisted trunk  perturbation by PT 1 minute x 3  Via PVC rod             Weak trunk strength   Seated manually resisted hip extension from end range hip flexion to promote ability to perform floor to stand transfer             R 10x3  No L heel pain with gait after aforementioned exercises.    Standing ankle DF/PF on rocker board 2 minutes   Standing B shoulder extension with scapular retraction 10x3 red band  Good workout felt.         Improved exercise technique, movement at target joints, use of target muscles after min to mod verbal, visual, tactile cues.     Response to treatment Decreased L heel pain after session. No L knee pain with gait. Good muscle use felt with exercises.    Continued working on trunk and R glute strengthening to help decrease stress to low back, No L heel pain after exercises. Patient will benefit from continued skilled physical therapy services to decrease pain, improve strength and function.      PT Short Term Goals - 01/26/19 1416      PT SHORT TERM GOAL #1   Title  Patient will be independent with her HEP to improve strength, decrease L knee pain, improve function.     Baseline  Pt performing her exercises at home per subjective reports (01/05/2019), (01/26/2019)    Time  3    Period  Weeks    Status  Achieved    Target Date  12/22/18        PT Long Term Goals - 02/21/19 1033      PT LONG TERM GOAL #1   Title  Patient will have a decrease in L knee pain to 3/10 or less at worst to promote ability to perform standing tasks such as stair negotiation more comfortably.     Baseline  7/10 L knee pain at worst for the past 3 months (11/29/2018); 4/10 at most for the past 7 days (01/05/2019); 3/10 L knee pain at most for the  past 7 days (01/09/2019), (01/26/2019); 1.5/10 L knee pain at most for the past 10 days (02/21/2019)    Time  8    Period  Weeks    Status  Achieved    Target Date  01/26/19      PT LONG TERM GOAL #2   Title  Patient will improve  bilateral LE strength by at least 1/2 MMT grade to promote ability to perform functional tasks such as stair negotiation as well as floor to stand transfers.     Time  6    Period  Weeks    Status  Partially Met    Target Date  03/09/19      PT LONG TERM GOAL #3   Title  Patient will be able to perform floor to stand transfer with mod independent to promote ability to get up from the floor if patient falls (pt goal).     Baseline  Pt needs assist to get up from the floor after a fall per subjective reports (11/29/2018). Pt needs assist with floor to stand transfers (01/26/2019)    Time  6    Period  Weeks    Status  Deferred    Target Date  03/09/19      PT LONG TERM GOAL #4   Title  Patient will improve her DGI score to at least 19/24 as a demonstration of decreased fall risk.     Baseline  15/24 (11/29/2018); 18/24 (01/09/2019); deferred secondary to L heel pain (01/26/2019); 19/24 (02/21/2019)    Time  6    Period  Weeks    Status  Achieved    Target Date  03/09/19      PT LONG TERM GOAL #5   Title  Patient will have a decrease in L heel pain to 3/10 or less at worst to promote ability to ambulate more comfortably.     Baseline  at least 7/10 L heel pain at worst (01/26/2019); 4/10 L heel pain at most for the past 7 days (02/21/2019)    Time  6    Period  Weeks    Status  Partially Met    Target Date  03/09/19            Plan - 02/27/19 1321    Clinical Impression Statement  Continued working on trunk and R glute strengthening to help decrease stress to low back, No L heel pain after exercises. Patient will benefit from continued skilled physical therapy services to decrease pain, improve strength and function.     Rehab Potential  Good    Clinical Impairments Affecting Rehab Potential  (-) L knee pain, LE weakness, hx of falls; (+) motivated    PT Frequency  2x / week    PT Duration  6 weeks    PT Treatment/Interventions  Aquatic Therapy;Electrical Stimulation;Iontophoresis  '4mg'$ /ml Dexamethasone;Gait training;Stair training;Functional mobility training;Therapeutic activities;Therapeutic exercise;Balance training;Neuromuscular re-education;Patient/family education;Manual techniques   electrical stimulation related treatment and ultrasound if appropriate   PT Next Visit Plan  glute, trunk, scapular strengthening, femoral control, balance training, manual techniques, modalities PRN    Consulted and Agree with Plan of Care  Patient       Patient will benefit from skilled therapeutic intervention in order to improve the following deficits and impairments:  Postural dysfunction, Abnormal gait, Decreased balance, Decreased range of motion, Decreased strength, Improper body mechanics  Visit Diagnosis: Left knee pain, unspecified chronicity  Muscle weakness (generalized)  History of falling  Pain in left  ankle and joints of left foot     Problem List Patient Active Problem List   Diagnosis Date Noted  . Itching 08/10/2018  . Varicose veins of bilateral lower extremities with pain 03/30/2018  . Lymphedema 03/30/2018  . Dizziness 02/02/2017  . Situational depression 12/18/2016  . Health maintenance examination 10/23/2016  . Fatigue 08/12/2016  . Essential hypertension 12/17/2015  . Dermatitis of external ear 12/17/2015  . Corn of foot 05/16/2015  . Advanced care planning/counseling discussion 10/30/2014  . Knee pain 08/17/2013  . Oropharyngeal dysphagia 11/15/2012  . Difficulty with family 03/30/2012  . Medicare annual wellness visit, subsequent 03/30/2012  . History of basal cell carcinoma   . Osteoarthritis   . Osteopenia   . Hypothyroidism   . Glaucoma   . Chronic venous insufficiency    Joneen Boers PT, DPT   02/27/2019, 4:41 PM  [facility] [address] [place], [address] Phone: [phone]   Fax:  [phone]  Name: Beth Rodgers MRN: [id number] Date of Birth:  19-Jul-1935

## 2019-03-02 ENCOUNTER — Ambulatory Visit: Payer: Medicare Other

## 2019-03-02 DIAGNOSIS — M6281 Muscle weakness (generalized): Secondary | ICD-10-CM

## 2019-03-02 DIAGNOSIS — Z9181 History of falling: Secondary | ICD-10-CM | POA: Diagnosis not present

## 2019-03-02 DIAGNOSIS — M25572 Pain in left ankle and joints of left foot: Secondary | ICD-10-CM | POA: Diagnosis not present

## 2019-03-02 DIAGNOSIS — M25562 Pain in left knee: Secondary | ICD-10-CM | POA: Diagnosis not present

## 2019-03-02 NOTE — Therapy (Signed)
[facility] [address], [place], [address] Phone: [phone]   Fax:  [phone]  Physical Therapy Treatment  Patient Details  Name: Beth Rodgers MRN: [id number] Date of Birth: 09/01/1935 Referring Provider (PT): Vickki Hearing, MD   Encounter Date: 03/02/2019  PT End of Session - 03/02/19 1350    Visit Number  23    Number of Visits  29    Date for PT Re-Evaluation  03/09/19    Authorization Type  3    Authorization Time Period  of 10 progress report    PT Start Time  1351    PT Stop Time  1432    PT Time Calculation (min)  41 min    Activity Tolerance  Patient tolerated treatment well    Behavior During Therapy  [facility] for tasks assessed/performed       Past Medical History:  Diagnosis Date  . Cellulitis 08/10/2018  . Chronic venous insufficiency    with varicose veins  . Glaucoma   . History  of basal cell carcinoma    left nare  . History of chicken pox   . Hypothyroid   . Osteoarthritis    back pain, L knee pain  . Osteopenia 06/2009   DEXA 11/2015: T -2.3 hip, -2.2 spine, on longterm bisphosphonate/evista  . Pyogenic granuloma 04/16/2016    Past Surgical History:  Procedure Laterality Date  . BREAST BIOPSY     core  . CATARACT EXTRACTION     bilateral  . DEXA  06/2009   T score Spine: -1.8, hip -2.0  . Foam sclerotherapy Bilateral 09/2015   [place], [place]  . NASAL RECONSTRUCTION  2005   for BCC L nare s/p Mohs  . nuclear stress test  2014   normal in [place]  . RADIOFREQUENCY ABLATION Left 01/2012   remnant L G saphenous vein below knee  . RADIOFREQUENCY ABLATION Right 02/2013   RLE vein ablation   . VEIN LIGATION AND STRIPPING  remote   bilateral G saphenous veins    There were no vitals filed for this visit.  Subjective Assessment - 03/02/19 1351    Subjective  The L knee has not been giving her problems. The heel still bothers her. 2/10 currently when walking.  L  heel stayed well until that night.     Pertinent History  Pt states that she has fallen 2x in the past year, once when she was sitting on a stool, the other time pt tripped on her R foot. Has a difficult time getting up due to weakness.  Nervous about being by herself if she fell. Has been doing her HEP daily.  Pt wears an inflatable machine for her legs.  Pt thinks her cellulitis is gone.  Feels pain lateral knee. Difficulty getting up from the floor because has a hard time kneeling due to knee pain.   Fell 3 times in the last 6 months (once falling off a stool and 2x tripping on her foot, does not remember which foot she tripped with, both falls when she tripped, pt fell on her L side).  Pain has improved since onset a little over 3 months ago. Unknown method of injury. Pt noticed swelling in L lateral knee before her falls.  Pt also states feeling pain in her neck after working on the computer or cooking.     Patient Stated Goals  Want to be able to get stronger and be able  to get up from the floor.     Currently in Pain?  Yes    Pain Score  2     Pain Location  Heel    Pain Orientation  Left    Pain Onset  More than a month ago                               PT Education - 03/02/19 1354    Education Details  ther-ex, HEP    Person(s) Educated  Patient    Methods  Explanation;Demonstration;Tactile cues;Verbal cues;Handout    Comprehension  Returned demonstration;Verbalized understanding        Objectives  MedbridgeAccess Code: [email or web address]   Standing posture: L lateral shift   Therapeutic exercise   Seated R hip extension isometrics 10x5 seconds for 3 sets  Standing L lateral shift correction 10x5 seconds for 2 sets  Standing R shoulder adduction green band 10x5 seconds for 3 sets to help decrease R lateral shift. Decreased L heel pain with gait  Straight pallof press double green band in standing   10x5 seconds   Running man for R LE and one UE  assist 10x. Somewhat difficult  No L heel pain with gait afterwards  Forward step up onto regular step with R LE and B UE assist 5x2   Reviewed HEP. Pt demonstrated and verbalized understanding.     Improved exercise technique, movement at target joints, use of target muscles after mod verbal, visual, tactile cues.    Response to treatment Decreased L heel pain with treatment to decrease L lateral shift, and improve lumbar posture.   Clinical impression Worked on improving posture to promote more neutral trunk positioning as well as improving glute muscle strength to promote more neutral positioning to low back. Decreased L heel pain with gait afterwards. Pt seems to be making good progress with decreased L knee pain overall based on subjective reports. Pt will benefit from continued skilled physical therapy services to decrease pain, improve strength and function.           PT Short Term Goals - 01/26/19 1416      PT SHORT TERM GOAL #1   Title  Patient will be independent with her HEP to improve strength, decrease L knee pain, improve function.     Baseline  Pt performing her exercises at home per subjective reports (01/05/2019), (01/26/2019)    Time  3    Period  Weeks    Status  Achieved    Target Date  12/22/18        PT Long Term Goals - 02/21/19 1033      PT LONG TERM GOAL #1   Title  Patient will have a decrease in L knee pain to 3/10 or less at worst to promote ability to perform standing tasks such as stair negotiation more comfortably.     Baseline  7/10 L knee pain at worst for the past 3 months (11/29/2018); 4/10 at most for the past 7 days (01/05/2019); 3/10 L knee pain at most for the past 7 days (01/09/2019), (01/26/2019); 1.5/10 L knee pain at most for the past 10 days (02/21/2019)    Time  8    Period  Weeks    Status  Achieved    Target Date  01/26/19      PT LONG TERM GOAL #2   Title  Patient will improve bilateral LE strength by at least  1/2 MMT grade to  promote ability to perform functional tasks such as stair negotiation as well as floor to stand transfers.     Time  6    Period  Weeks    Status  Partially Met    Target Date  03/09/19      PT LONG TERM GOAL #3   Title  Patient will be able to perform floor to stand transfer with mod independent to promote ability to get up from the floor if patient falls (pt goal).     Baseline  Pt needs assist to get up from the floor after a fall per subjective reports (11/29/2018). Pt needs assist with floor to stand transfers (01/26/2019)    Time  6    Period  Weeks    Status  Deferred    Target Date  03/09/19      PT LONG TERM GOAL #4   Title  Patient will improve her DGI score to at least 19/24 as a demonstration of decreased fall risk.     Baseline  15/24 (11/29/2018); 18/24 (01/09/2019); deferred secondary to L heel pain (01/26/2019); 19/24 (02/21/2019)    Time  6    Period  Weeks    Status  Achieved    Target Date  03/09/19      PT LONG TERM GOAL #5   Title  Patient will have a decrease in L heel pain to 3/10 or less at worst to promote ability to ambulate more comfortably.     Baseline  at least 7/10 L heel pain at worst (01/26/2019); 4/10 L heel pain at most for the past 7 days (02/21/2019)    Time  6    Period  Weeks    Status  Partially Met    Target Date  03/09/19            Plan - 03/02/19 1355    Clinical Impression Statement  Worked on improving posture to promote more neutral trunk positioning as well as improving glute muscle strength to promote more neutral positioning to low back. Decreased L heel pain with gait afterwards. Pt seems to be making good progress with decreased L knee pain overall based on subjective reports. Pt will benefit from continued skilled physical therapy services to decrease pain, improve strength and function.     Rehab Potential  Good    Clinical Impairments Affecting Rehab Potential  (-) L knee pain, LE weakness, hx of falls; (+) motivated    PT  Frequency  2x / week    PT Duration  6 weeks    PT Treatment/Interventions  Aquatic Therapy;Electrical Stimulation;Iontophoresis 6m/ml Dexamethasone;Gait training;Stair training;Functional mobility training;Therapeutic activities;Therapeutic exercise;Balance training;Neuromuscular re-education;Patient/family education;Manual techniques   electrical stimulation related treatment and ultrasound if appropriate   PT Next Visit Plan  glute, trunk, scapular strengthening, femoral control, balance training, manual techniques, modalities PRN    Consulted and Agree with Plan of Care  Patient       Patient will benefit from skilled therapeutic intervention in order to improve the following deficits and impairments:  Postural dysfunction, Abnormal gait, Decreased balance, Decreased range of motion, Decreased strength, Improper body mechanics  Visit Diagnosis: Left knee pain, unspecified chronicity  Muscle weakness (generalized)  History of falling  Pain in left ankle and joints of left foot     Problem List Patient Active Problem List   Diagnosis Date Noted  . Itching 08/10/2018  . Varicose veins of bilateral lower extremities with pain 03/30/2018  .  Lymphedema 03/30/2018  . Dizziness 02/02/2017  . Situational depression 12/18/2016  . Health maintenance examination 10/23/2016  . Fatigue 08/12/2016  . Essential hypertension 12/17/2015  . Dermatitis of external ear 12/17/2015  . Corn of foot 05/16/2015  . Advanced care planning/counseling discussion 10/30/2014  . Knee pain 08/17/2013  . Oropharyngeal dysphagia 11/15/2012  . Difficulty with family 03/30/2012  . Medicare annual wellness visit, subsequent 03/30/2012  . History of basal cell carcinoma   . Osteoarthritis   . Osteopenia   . Hypothyroidism   . Glaucoma   . Chronic venous insufficiency     Joneen Boers PT, DPT   03/02/2019, 4:58 PM  [facility] [address] [place], [address] Phone: [phone]   Fax:  [phone]  Name: Beth Rodgers MRN: [id number] Date of Birth: 1935/12/28

## 2019-03-02 NOTE — Patient Instructions (Addendum)
  MedbridgeAccess Code: [email or web address] Shoulder Adduction with Anchored Resistance  Green band 10x5 seconds for R UE 2 sets     Seated hip extension isometrics   Sitting on a chair,    Squeeze your rear end muscles together and press your right foot onto the floor.     Hold for 5 seconds    Repeat 10 times   Perform 3 sets daily.      This is a corrective exercise. Once you no longer have symptoms, you can stop.

## 2019-03-03 ENCOUNTER — Telehealth: Payer: Self-pay

## 2019-03-03 NOTE — Telephone Encounter (Signed)
Yes I do want fasting labs. plz bring in notification she received and we can review at [facility]. I am not aware they're newly covering this.

## 2019-03-03 NOTE — Telephone Encounter (Signed)
Pt has annual wellness with Lattie Haw on 03/14/19 and wants to know if needs to fast for labs; also pt got notoification from medicare that medicare is instituting preventative test for carotid artery. Pt wants to know if that is accurate.Please advise.

## 2019-03-06 ENCOUNTER — Ambulatory Visit: Payer: Medicare Other

## 2019-03-06 DIAGNOSIS — Z9181 History of falling: Secondary | ICD-10-CM

## 2019-03-06 DIAGNOSIS — M6281 Muscle weakness (generalized): Secondary | ICD-10-CM | POA: Diagnosis not present

## 2019-03-06 DIAGNOSIS — M25572 Pain in left ankle and joints of left foot: Secondary | ICD-10-CM

## 2019-03-06 DIAGNOSIS — M25562 Pain in left knee: Secondary | ICD-10-CM

## 2019-03-06 NOTE — Telephone Encounter (Signed)
Per Jeani Hawking, pt returned call and was given Dr. Synthia Innocent message.

## 2019-03-06 NOTE — Therapy (Signed)
[facility] [address] [place], [address] Phone: [phone]   Fax:  [phone]  Physical Therapy Treatment  Patient Details  Name: Beth Rodgers MRN: [id number] Date of Birth: [birth year] Referring Provider (PT): Vickki Hearing, MD   Encounter Date: 03/06/2019  PT End of Session - 03/06/19 1308    Visit Number  24    Number of Visits  29    Date for PT Re-Evaluation  03/09/19    Authorization Type  4    Authorization Time Period  of 10 progress report    PT Start Time  1309   pt arrived late   PT Stop Time  1351    PT Time Calculation (min)  42 min    Activity Tolerance  Patient tolerated treatment well    Behavior During Therapy  [facility] for tasks assessed/performed       Past Medical History:  Diagnosis Date  . Cellulitis 08/10/2018  . Chronic venous insufficiency    with varicose veins  . Glaucoma   . History  of basal cell carcinoma    left nare  . History of chicken pox   . Hypothyroid   . Osteoarthritis    back pain, L knee pain  . Osteopenia 06/2009   DEXA 11/2015: T -2.3 hip, -2.2 spine, on longterm bisphosphonate/evista  . Pyogenic granuloma 04/16/2016    Past Surgical History:  Procedure Laterality Date  . BREAST BIOPSY     core  . CATARACT EXTRACTION     bilateral  . DEXA  06/2009   T score Spine: -1.8, hip -2.0  . Foam sclerotherapy Bilateral 09/2015   [place], [place]  . NASAL RECONSTRUCTION  2005   for BCC L nare s/p Mohs  . nuclear stress test  2014   normal in [place]  . RADIOFREQUENCY ABLATION Left 01/2012   remnant L G saphenous vein below knee  . RADIOFREQUENCY ABLATION Right 02/2013   RLE vein ablation   . VEIN LIGATION AND STRIPPING  remote   bilateral G saphenous veins    There were no vitals filed for this visit.  Subjective Assessment - 03/06/19 1310    Subjective  The seated R hip extension isometric exercise helped her L heel the most. 2/10 L heel  pain when working at first, then 1.5/10 after doing the exercise.  2.5/10 L heel pain at worst for the past 7 days.  No L knee pain unless she presses on it.   Wants to check with her insurance first prior to continuing PT after next session.     Pertinent History  Pt states that she has fallen 2x in the past year, once when she was sitting on a stool, the other time pt tripped on her R foot. Has a difficult time getting up due to weakness.  Nervous about being by herself if she fell. Has been doing her HEP daily.  Pt wears an inflatable machine for her legs.  Pt thinks her cellulitis is gone.  Feels pain lateral knee. Difficulty getting up from the floor because has a hard time kneeling due to knee pain.   Fell 3 times in the last 6 months (once falling off a stool and 2x tripping on her foot, does not remember which foot she tripped with, both falls when she tripped, pt fell on her L side).  Pain has improved since onset a little over 3 months ago. Unknown method of injury. Pt  noticed swelling in L lateral knee before her falls.  Pt also states feeling pain in her neck after working on the computer or cooking.     Patient Stated Goals  Want to be able to get stronger and be able to get up from the floor.     Currently in Pain?  Yes    Pain Score  2    L heel   Pain Onset  More than a month ago                               PT Education - 03/06/19 1403    Education Details  ther-ex    Person(s) Educated  Patient    Methods  Explanation;Demonstration;Tactile cues;Verbal cues    Comprehension  Returned demonstration;Verbalized understanding       Objectives  MedbridgeAccess Code: [email or web address]   Standing posture: L lateral shift   Manual therapy   R S/L gentle lumbar non-thrust  mobilization to help decrease L heel pain. No change in heel pain with gait  L S/L gentle lumbar non-thrust mobilization to help decrease L heel pain. Slight decrease in heel pain with  gait   Therapeutic exercise   Running man for R LE and one UE assist 10x3  Standing L lateral shift correction 10x5 seconds for 2 sets  decreaesd L heel pain with gait. Reviewed and given as part of her HEP.   Seated hip hinging with PVC rod for posture  10x3  HEP   Standing mini dead lift, no weight  10x  Pt states seeing circles spinning afterwards. No dizziness  Blood pressure L arm sitting, mechanically taken, normal cuff: 156/80, HR 68. Pt states being stressed due to the stock market.   Improved exercise technique, movement at target joints, use of target muscles after mod verbal, visual, tactile cues.    Response to treatment Decreased L heel pain with treatment to decrease L lateral shift, improving lumbar posture and improving LE strength.      Clinical impression Continued working on improving lumbar posture, glute strength, and overall LE strength to help decrease L heel pain and improve ability to ambulate more comfortably. Pt seems to be making overall progress with decreased L heel pain and L knee pain seems to be doing well also based on subjective reports. Pt will benefit from continued skilled physical therapy services to decrease pain, improve strength and function.       PT Short Term Goals - 01/26/19 1416      PT SHORT TERM GOAL #1   Title  Patient will be independent with her HEP to improve strength, decrease L knee pain, improve function.     Baseline  Pt performing her exercises at home per subjective reports (01/05/2019), (01/26/2019)    Time  3    Period  Weeks    Status  Achieved    Target Date  12/22/18        PT Long Term Goals - 02/21/19 1033      PT LONG TERM GOAL #1   Title  Patient will have a decrease in L knee pain to 3/10 or less at worst to promote ability to perform standing tasks such as stair negotiation more comfortably.     Baseline  7/10 L knee pain at worst for the past 3 months (11/29/2018); 4/10 at most for the past 7  days (01/05/2019); 3/10 L knee pain at most for  the past 7 days (01/09/2019), (01/26/2019); 1.5/10 L knee pain at most for the past 10 days (02/21/2019)    Time  8    Period  Weeks    Status  Achieved    Target Date  01/26/19      PT LONG TERM GOAL #2   Title  Patient will improve bilateral LE strength by at least 1/2 MMT grade to promote ability to perform functional tasks such as stair negotiation as well as floor to stand transfers.     Time  6    Period  Weeks    Status  Partially Met    Target Date  03/09/19      PT LONG TERM GOAL #3   Title  Patient will be able to perform floor to stand transfer with mod independent to promote ability to get up from the floor if patient falls (pt goal).     Baseline  Pt needs assist to get up from the floor after a fall per subjective reports (11/29/2018). Pt needs assist with floor to stand transfers (01/26/2019)    Time  6    Period  Weeks    Status  Deferred    Target Date  03/09/19      PT LONG TERM GOAL #4   Title  Patient will improve her DGI score to at least 19/24 as a demonstration of decreased fall risk.     Baseline  15/24 (11/29/2018); 18/24 (01/09/2019); deferred secondary to L heel pain (01/26/2019); 19/24 (02/21/2019)    Time  6    Period  Weeks    Status  Achieved    Target Date  03/09/19      PT LONG TERM GOAL #5   Title  Patient will have a decrease in L heel pain to 3/10 or less at worst to promote ability to ambulate more comfortably.     Baseline  at least 7/10 L heel pain at worst (01/26/2019); 4/10 L heel pain at most for the past 7 days (02/21/2019)    Time  6    Period  Weeks    Status  Partially Met    Target Date  03/09/19            Plan - 03/06/19 1300    Clinical Impression Statement  Continued working on improving lumbar posture, glute strength, and overall LE strength to help decrease L heel pain and improve ability to ambulate more comfortably. Pt seems to be making overall progress with decreased L heel pain  and L knee pain seems to be doing well also based on subjective reports. Pt will benefit from continued skilled physical therapy services to decrease pain, improve strength and function.     Rehab Potential  Good    Clinical Impairments Affecting Rehab Potential  (-) L knee pain, LE weakness, hx of falls; (+) motivated    PT Frequency  2x / week    PT Duration  6 weeks    PT Treatment/Interventions  Aquatic Therapy;Electrical Stimulation;Iontophoresis 62m/ml Dexamethasone;Gait training;Stair training;Functional mobility training;Therapeutic activities;Therapeutic exercise;Balance training;Neuromuscular re-education;Patient/family education;Manual techniques   electrical stimulation related treatment and ultrasound if appropriate   PT Next Visit Plan  glute, trunk, scapular strengthening, femoral control, balance training, manual techniques, modalities PRN    Consulted and Agree with Plan of Care  Patient       Patient will benefit from skilled therapeutic intervention in order to improve the following deficits and impairments:  Postural dysfunction, Abnormal gait, Decreased  balance, Decreased range of motion, Decreased strength, Improper body mechanics  Visit Diagnosis: Left knee pain, unspecified chronicity  Muscle weakness (generalized)  History of falling  Pain in left ankle and joints of left foot     Problem List Patient Active Problem List   Diagnosis Date Noted  . Itching 08/10/2018  . Varicose veins of bilateral lower extremities with pain 03/30/2018  . Lymphedema 03/30/2018  . Dizziness 02/02/2017  . Situational depression 12/18/2016  . Health maintenance examination 10/23/2016  . Fatigue 08/12/2016  . Essential hypertension 12/17/2015  . Dermatitis of external ear 12/17/2015  . Corn of foot 05/16/2015  . Advanced care planning/counseling discussion 10/30/2014  . Knee pain 08/17/2013  . Oropharyngeal dysphagia 11/15/2012  . Difficulty with family 03/30/2012  .  Medicare annual wellness visit, subsequent 03/30/2012  . History of basal cell carcinoma   . Osteoarthritis   . Osteopenia   . Hypothyroidism   . Glaucoma   . Chronic venous insufficiency     Joneen Boers PT, DPT   03/06/2019, 2:25 PM  [facility] [address], [place], [address] Phone: [phone]   Fax:  [phone]  Name: AMARACHI KOTZ MRN: [id number] Date of Birth: [birth year]

## 2019-03-06 NOTE — Patient Instructions (Addendum)
Standing L lateral shift correction 10x5 seconds for 2 sets and seated hip hinging reviewed and given as part of her HEP. Pt demonstrated and verbalized understanding.

## 2019-03-06 NOTE — Telephone Encounter (Signed)
Left message on vm for pt to call back.  Need to relay Dr. G's message.  

## 2019-03-09 ENCOUNTER — Ambulatory Visit (INDEPENDENT_AMBULATORY_CARE_PROVIDER_SITE_OTHER): Payer: Medicare Other | Admitting: Psychology

## 2019-03-09 ENCOUNTER — Ambulatory Visit: Payer: Medicare Other

## 2019-03-09 ENCOUNTER — Other Ambulatory Visit: Payer: Self-pay

## 2019-03-09 DIAGNOSIS — F4323 Adjustment disorder with mixed anxiety and depressed mood: Secondary | ICD-10-CM | POA: Diagnosis not present

## 2019-03-13 ENCOUNTER — Ambulatory Visit: Payer: Medicare Other

## 2019-03-14 ENCOUNTER — Other Ambulatory Visit: Payer: Self-pay | Admitting: Family Medicine

## 2019-03-14 ENCOUNTER — Other Ambulatory Visit (INDEPENDENT_AMBULATORY_CARE_PROVIDER_SITE_OTHER): Payer: Medicare Other

## 2019-03-14 ENCOUNTER — Other Ambulatory Visit: Payer: Self-pay

## 2019-03-14 ENCOUNTER — Ambulatory Visit: Payer: Self-pay

## 2019-03-14 DIAGNOSIS — I10 Essential (primary) hypertension: Secondary | ICD-10-CM | POA: Diagnosis not present

## 2019-03-14 DIAGNOSIS — E559 Vitamin D deficiency, unspecified: Secondary | ICD-10-CM | POA: Diagnosis not present

## 2019-03-14 DIAGNOSIS — R5383 Other fatigue: Secondary | ICD-10-CM

## 2019-03-14 DIAGNOSIS — E039 Hypothyroidism, unspecified: Secondary | ICD-10-CM | POA: Diagnosis not present

## 2019-03-14 DIAGNOSIS — E785 Hyperlipidemia, unspecified: Secondary | ICD-10-CM

## 2019-03-14 LAB — LIPID PANEL
Cholesterol: 195 mg/dL (ref 0–200)
HDL: 72.4 mg/dL (ref >39.00)
LDL Cholesterol: 109 mg/dL — ABNORMAL HIGH (ref 0–99)
NonHDL: 122.78
Total CHOL/HDL Ratio: 3
Triglycerides: 70 mg/dL (ref 0.0–149.0)
VLDL: 14 mg/dL (ref 0.0–40.0)

## 2019-03-14 LAB — CBC WITH DIFFERENTIAL/PLATELET
BASOS PCT: 0.3 % (ref 0.0–3.0)
Basophils Absolute: 0 10*3/uL (ref 0.0–0.1)
Eosinophils Absolute: 0.2 10*3/uL (ref 0.0–0.7)
Eosinophils Relative: 3.3 % (ref 0.0–5.0)
HCT: 41.3 % (ref 36.0–46.0)
HEMOGLOBIN: 13.7 g/dL (ref 12.0–15.0)
LYMPHS ABS: 1.3 10*3/uL (ref 0.7–4.0)
Lymphocytes Relative: 22.1 % (ref 12.0–46.0)
MCHC: 33.3 g/dL (ref 30.0–36.0)
MCV: 86.6 fl (ref 78.0–100.0)
MONO ABS: 0.8 10*3/uL (ref 0.1–1.0)
Monocytes Relative: 13.1 % — ABNORMAL HIGH (ref 3.0–12.0)
NEUTROS PCT: 61.2 % (ref 43.0–77.0)
Neutro Abs: 3.5 10*3/uL (ref 1.4–7.7)
Platelets: 206 10*3/uL (ref 150.0–400.0)
RBC: 4.76 Mil/uL (ref 3.87–5.11)
RDW: 14 % (ref 11.5–15.5)
WBC: 5.8 10*3/uL (ref 4.0–10.5)

## 2019-03-14 LAB — BASIC METABOLIC PANEL
BUN: 18 mg/dL (ref 6–23)
CO2: 30 mEq/L (ref 19–32)
CREATININE: 0.67 mg/dL (ref 0.40–1.20)
Calcium: 9.2 mg/dL (ref 8.4–10.5)
Chloride: 105 mEq/L (ref 96–112)
GFR: 84.3 mL/min (ref >60.00)
Glucose, Bld: 89 mg/dL (ref 70–99)
Potassium: 4.2 mEq/L (ref 3.5–5.1)
Sodium: 141 mEq/L (ref 135–145)

## 2019-03-14 LAB — TSH: TSH: 4.38 u[IU]/mL (ref 0.35–4.50)

## 2019-03-14 LAB — VITAMIN D 25 HYDROXY (VIT D DEFICIENCY, FRACTURES): VITD: 21.43 ng/mL — ABNORMAL LOW (ref 30.00–100.00)

## 2019-03-14 NOTE — Addendum Note (Signed)
Addended by: Ellamae Sia on: 03/14/2019 11:25 AM   Modules accepted: Orders

## 2019-03-15 ENCOUNTER — Ambulatory Visit: Payer: Medicare Other

## 2019-03-17 ENCOUNTER — Telehealth: Payer: Self-pay | Admitting: Family Medicine

## 2019-03-17 NOTE — Telephone Encounter (Signed)
Pt called back. °

## 2019-03-17 NOTE — Telephone Encounter (Signed)
Left message asking pt to call office please r/s 3/24

## 2019-03-18 ENCOUNTER — Other Ambulatory Visit: Payer: Self-pay | Admitting: Family Medicine

## 2019-03-20 ENCOUNTER — Ambulatory Visit: Payer: Medicare Other

## 2019-03-20 NOTE — Telephone Encounter (Signed)
R/s to 6/1

## 2019-03-21 ENCOUNTER — Encounter: Payer: Medicare Other | Admitting: Family Medicine

## 2019-03-22 ENCOUNTER — Ambulatory Visit: Payer: Medicare Other

## 2019-03-24 ENCOUNTER — Telehealth: Payer: Self-pay

## 2019-03-24 NOTE — Telephone Encounter (Signed)
Left message for pt to call back.  Need to relay Dr. Synthia Innocent message and let Dr. Darnell Level know if pt wants to try rx strength weekly vit D or adjust thyroid med.

## 2019-03-24 NOTE — Telephone Encounter (Signed)
Pt had labs done on 03/14/19 and pt request Dr Darnell Level to review; pt is feeling weak on and off and pt feels insecure and lack of strength; pts husband helps her lay down and rest and then pt feels better and wants to know if thyroid results indicate need to change thyroid med or is any issue with K..pt drinks OJ and after laying down she feels better Presently taking Levothyroxine 75 mcg one tab daily. If med needs to be sent to [facility] pt wants to be notified first. Pt request cb after Dr Darnell Level reviews.[facility]

## 2019-03-24 NOTE — Telephone Encounter (Signed)
Spoke w/ pt of below options. Overall feels well.  She has not been taking vit D supplement.  Will start 2000 IU daily regularly. Discussed low normal thyroid function - she will try vit D replacement first.

## 2019-03-24 NOTE — Telephone Encounter (Signed)
plz notify labs overall ok, vit D was low so we could try prescription strength weekly vit D instead of her daily 2000 units.  Thyroid labs were overall stable but on the low side of normal - so it would also be reasonable to increase thyroid medicine dose a little to see if any improvement in symptoms.  Let me know if she would like to try either of these 2 options.

## 2019-03-27 ENCOUNTER — Ambulatory Visit: Payer: Medicare Other

## 2019-03-27 NOTE — Telephone Encounter (Signed)
Noted  

## 2019-03-28 ENCOUNTER — Ambulatory Visit (INDEPENDENT_AMBULATORY_CARE_PROVIDER_SITE_OTHER): Payer: Medicare Other | Admitting: Psychology

## 2019-03-28 DIAGNOSIS — F4323 Adjustment disorder with mixed anxiety and depressed mood: Secondary | ICD-10-CM | POA: Diagnosis not present

## 2019-04-18 ENCOUNTER — Ambulatory Visit (INDEPENDENT_AMBULATORY_CARE_PROVIDER_SITE_OTHER): Payer: Medicare Other | Admitting: Psychology

## 2019-04-18 DIAGNOSIS — F4323 Adjustment disorder with mixed anxiety and depressed mood: Secondary | ICD-10-CM | POA: Diagnosis not present

## 2019-04-30 ENCOUNTER — Other Ambulatory Visit: Payer: Self-pay | Admitting: Family Medicine

## 2019-05-02 ENCOUNTER — Ambulatory Visit (INDEPENDENT_AMBULATORY_CARE_PROVIDER_SITE_OTHER): Payer: Medicare Other | Admitting: Psychology

## 2019-05-02 DIAGNOSIS — F4323 Adjustment disorder with mixed anxiety and depressed mood: Secondary | ICD-10-CM

## 2019-05-15 ENCOUNTER — Ambulatory Visit: Payer: Medicare Other | Attending: Sports Medicine

## 2019-05-15 ENCOUNTER — Other Ambulatory Visit: Payer: Self-pay

## 2019-05-15 DIAGNOSIS — M25572 Pain in left ankle and joints of left foot: Secondary | ICD-10-CM | POA: Diagnosis not present

## 2019-05-15 DIAGNOSIS — M6281 Muscle weakness (generalized): Secondary | ICD-10-CM | POA: Insufficient documentation

## 2019-05-15 DIAGNOSIS — M25562 Pain in left knee: Secondary | ICD-10-CM | POA: Diagnosis not present

## 2019-05-15 DIAGNOSIS — Z9181 History of falling: Secondary | ICD-10-CM | POA: Diagnosis not present

## 2019-05-15 NOTE — Patient Instructions (Addendum)
Seated plantar flexion isometrics    Sitting on a chair, press your left forefoot onto the floor with 40% effort for 1 min.     Rest for 1-2 minutes    Repeat 5 times     Perform 3 times daily.      Pt was also recommended to use an arch support for her feet to help decrease tension to her Achilles tendon when standing and walking. Pt demonstrated and verbalized understanding.

## 2019-05-15 NOTE — Therapy (Signed)
[facility] [address] [place], [address] Phone: [phone]   Fax:  [phone]  Physical Therapy Treatment And Progress Report  Patient Details  Name: Beth Rodgers MRN: [id number] Date of Birth: 06/08/1935 Referring Provider (PT): Vickki Hearing, MD   Encounter Date: 05/15/2019  PT End of Session - 05/15/19 1610    Visit Number  25    Number of Visits  29    Date for PT Re-Evaluation  03/09/19    Authorization Type  5    Authorization Time Period  of 10 progress report    PT Start Time  4196    PT Stop Time  2229    PT Time Calculation (min)  49 min    Activity Tolerance  Patient tolerated treatment well    Behavior During Therapy  [facility] for tasks assessed/performed       Past Medical History:  Diagnosis Date  . Cellulitis 08/10/2018  . Chronic venous insufficiency    with varicose veins  . Glaucoma   . History  of basal cell carcinoma    left nare  . History of chicken pox   . Hypothyroid   . Osteoarthritis    back pain, L knee pain  . Osteopenia 06/2009   DEXA 11/2015: T -2.3 hip, -2.2 spine, on longterm bisphosphonate/evista  . Pyogenic granuloma 04/16/2016    Past Surgical History:  Procedure Laterality Date  . BREAST BIOPSY     core  . CATARACT EXTRACTION     bilateral  . DEXA  06/2009   T score Spine: -1.8, hip -2.0  . Foam sclerotherapy Bilateral 09/2015   [facility] [place], [place]  . NASAL RECONSTRUCTION  2005   for BCC L nare s/p Mohs  . nuclear stress test  2014   normal in [place]  . RADIOFREQUENCY ABLATION Left 01/2012   remnant L G saphenous vein below knee  . RADIOFREQUENCY ABLATION Right 02/2013   RLE vein ablation   . VEIN LIGATION AND STRIPPING  remote   bilateral G saphenous veins    There were no vitals filed for this visit.  Subjective Assessment - 05/15/19 1613    Subjective  Pt wants to ask her doctor of the L heel pain is permanent. Feels pain on her L  heel. It is making her limp which makes her L lateral hip and thigh (L3/L4/L5 dermatome to the thigh) feel sore.  The seated L hip extension isometrics helps her L heel feel better.  5/10 L heel pain currently, 8/10 (yesterday, went out shopping and walking) at worst for the past 2 weeks.  The L knee has not bothered her too much since the L heel pain started bothering her. No L knee pain currently, and 3/10 at most for the past 2 weeks.     Pertinent History  Pt states that she has fallen 2x in the past year, once when she was sitting on a stool, the other time pt tripped on her R foot. Has a difficult time getting up due to weakness.  Nervous about being by herself if she fell. Has been doing her HEP daily.  Pt wears an inflatable machine for her legs.  Pt thinks her cellulitis is gone.  Feels pain lateral knee. Difficulty getting up from the floor because has a hard time kneeling due to knee pain.   Fell 3 times in the last 6 months (once falling off a stool and 2x tripping on  her foot, does not remember which foot she tripped with, both falls when she tripped, pt fell on her L side).  Pain has improved since onset a little over 3 months ago. Unknown method of injury. Pt noticed swelling in L lateral knee before her falls.  Pt also states feeling pain in her neck after working on the computer or cooking.     Patient Stated Goals  Want to be able to get stronger and be able to get up from the floor.     Currently in Pain?  Yes    Pain Score  5    5/10 L heel   Pain Onset  More than a month ago         [facility] PT Assessment - 05/15/19 1633      Strength   Right Hip Flexion  4+/5    Right Hip Extension  4+/5   seated manually resisted hip extension   Right Hip ABduction  4+/5   seated clamshell isometrics   Left Hip Flexion  4+/5    Left Hip Extension  4/5   seated manually resisted hip extension   Left Hip External Rotation  --   seated clamshell isometrics   Left Hip ABduction  4/5   seated  clamshell isometrics   Right Knee Flexion  4+/5    Right Knee Extension  5/5    Left Knee Flexion  4+/5    Left Knee Extension  4+/5                           PT Education - 05/15/19 1627    Education Details  ther-ex, HEP    Person(s) Educated  Patient    Methods  Explanation;Demonstration;Tactile cues;Verbal cues;Handout    Comprehension  Verbalized understanding;Returned demonstration      Objectives  The patient has been informed of the current processes in place at outpatient rehab to protect pts against COVID-19 exposure including social distancing, schedule modifications, and cleaning procedures. After discussing the particular risk with a therapist based on the patient's personal risk factors, the patient has decided to proceed with in-person therapy.   MedbridgeAccess Code: [email or web address]   Standing posture: L lateral shift TTP L Achilles tendon insertion    Manual therapy  Transverse friction technique L Achilles x 5 min to promote blood flow   Therapeutic exercise   Standing L lateral shift correction 10x5 seconds for 3 sets  Slight decrease in L heel pain  Seated B shoulder extension isometrics 10x5 seconds for 2 set  Seated manually resisted hip flexion, hip extension, clamshell isometrics, knee flexion, knee extension 1-2x each way for each LE  Reviewed progress/current status with strength with pt  Improved B hamstrings strength, no pain with resisted leg extension  After transverse friction technique to L achilles  seated L foot plantar flexion isometrics, 40 % effort x 1 minute with 1 min rest breaks for 4 times  Decreased L heel pain  Reviewed and given as part of her HEP. Pt demonstrated and verbalized understanding.  Pt was also recommended to use an arch support for her feet to help decrease tension to her Achilles tendon when standing and walking. Pt demonstrated and verbalized understanding.    Improved exercise  technique, movement at target joints, use of target muscles after mod verbal, visual, tactile cues.      Response to treatment Decreased L heel pain after treatment to promote posture as well as  after performing cross friction technique to L Achilles and insertion followed by isometric plantarflexion exercise.       Clinical impression Skilled physical therapy services were placed on hold about 1.5 months due to COVID-19 related restrictions. The clinic re-opened for patients with non-surgical conditions today and in-person treatment resumed. Pt demonstrates good carry over of decreased overall L knee pain. Pt however currently experiences increased L heel pain since last session with 8/10 level at worst for the past 2 weeks. Pt demonstrates TTP L Achilles tendon area and at insertion. Per pt, the heel pain has been bothering her since November 2019. Performed cross-friction technique to the aforementioned area followed by isometric plantarflexion to promote proper stress to the tendon and distal insertion which pt reports helped with her heel pain afterwards. Pt will benefit from continued skilled physical therapy services to improve B LE strength, continue to decrease L knee and L heel pain, improve ability to perform standing tasks, and decrease difficulty with gait.     PT Short Term Goals - 01/26/19 1416      PT SHORT TERM GOAL #1   Title  Patient will be independent with her HEP to improve strength, decrease L knee pain, improve function.     Baseline  Pt performing her exercises at home per subjective reports (01/05/2019), (01/26/2019)    Time  3    Period  Weeks    Status  Achieved    Target Date  12/22/18        PT Long Term Goals - 05/15/19 1707      PT LONG TERM GOAL #1   Title  Patient will have a decrease in L knee pain to 3/10 or less at worst to promote ability to perform standing tasks such as stair negotiation more comfortably.     Baseline  7/10 L knee pain at  worst for the past 3 months (11/29/2018); 4/10 at most for the past 7 days (01/05/2019); 3/10 L knee pain at most for the past 7 days (01/09/2019), (01/26/2019); 1.5/10 L knee pain at most for the past 10 days (02/21/2019); 3/10 L knee pain at most for the past 2 weeks (05/15/2019)    Time  8    Period  Weeks    Status  Achieved    Target Date  01/26/19      PT LONG TERM GOAL #2   Title  Patient will improve bilateral LE strength by at least 1/2 MMT grade to promote ability to perform functional tasks such as stair negotiation as well as floor to stand transfers.     Time  8    Period  Weeks    Status  Partially Met    Target Date  07/13/19      PT LONG TERM GOAL #3   Title  Patient will be able to perform floor to stand transfer with mod independent to promote ability to get up from the floor if patient falls (pt goal).     Baseline  Pt needs assist to get up from the floor after a fall per subjective reports (11/29/2018). Pt needs assist with floor to stand transfers (01/26/2019)    Time  8    Period  Weeks    Status  Deferred    Target Date  07/13/19      PT LONG TERM GOAL #4   Title  Patient will improve her DGI score to at least 19/24 as a demonstration of decreased fall risk.  Baseline  15/24 (11/29/2018); 18/24 (01/09/2019); deferred secondary to L heel pain (01/26/2019); 19/24 (02/21/2019)    Time  6    Period  Weeks    Status  Achieved    Target Date  03/09/19      PT LONG TERM GOAL #5   Title  Patient will have a decrease in L heel pain to 3/10 or less at worst to promote ability to ambulate more comfortably.     Baseline  at least 7/10 L heel pain at worst (01/26/2019); 4/10 L heel pain at most for the past 7 days (02/21/2019); 8/10 at most for the past 2 weeks (05/15/2019)    Time  8    Period  Weeks    Status  Partially Met    Target Date  07/13/19            Plan - 05/15/19 1604    Clinical Impression Statement  Skilled physical therapy services were placed on hold about  1.5 months due to COVID-19 related restrictions. The clinic re-opened for patients with non-surgical conditions today and in-person treatment resumed. Pt demonstrates good carry over of decreased overall L knee pain. Pt however currently experiences increased L heel pain since last session with 8/10 level at worst for the past 2 weeks. Pt demonstrates TTP L Achilles tendon area and at insertion. Per pt, the heel pain has been bothering her since November 2019. Performed cross-friction technique to the aforementioned area followed by isometric plantarflexion to promote proper stress to the tendon and distal insertion which pt reports helped with her heel pain afterwards. Pt will benefit from continued skilled physical therapy services to improve B LE strength, continue to decrease L knee and L heel pain, improve ability to perform standing tasks, and decrease difficulty with gait.     Personal Factors and Comorbidities  Age;Comorbidity 3+;Time since onset of injury/illness/exacerbation;Fitness    Comorbidities  Osteopenia, chronic venous insufficiency, L knee, and L heel pain    Examination-Activity Limitations  Carry;Lift;Stand;Locomotion Level;Squat;Stairs    Stability/Clinical Decision Making  Evolving/Moderate complexity    Clinical Decision Making  Moderate    Clinical Presentation due to:  increased L heel pain since last session    Rehab Potential  Good    Clinical Impairments Affecting Rehab Potential  (-) L knee pain, LE weakness, hx of falls; (+) motivated    PT Frequency  2x / week    PT Duration  8 weeks    PT Treatment/Interventions  Aquatic Therapy;Electrical Stimulation;Iontophoresis '4mg'$ /ml Dexamethasone;Gait training;Stair training;Functional mobility training;Therapeutic activities;Therapeutic exercise;Balance training;Neuromuscular re-education;Patient/family education;Manual techniques   electrical stimulation related treatment and ultrasound if appropriate   PT Next Visit Plan   isometric plantar flexion, glute, trunk, scapular strengthening, femoral control, balance training, manual techniques, modalities PRN    Consulted and Agree with Plan of Care  Patient       Patient will benefit from skilled therapeutic intervention in order to improve the following deficits and impairments:  Postural dysfunction, Abnormal gait, Decreased balance, Decreased range of motion, Decreased strength, Improper body mechanics  Visit Diagnosis: Left knee pain, unspecified chronicity - Plan: PT plan of care cert/re-cert  Muscle weakness (generalized) - Plan: PT plan of care cert/re-cert  History of falling - Plan: PT plan of care cert/re-cert  Pain in left ankle and joints of left foot - Plan: PT plan of care cert/re-cert     Problem List Patient Active Problem List   Diagnosis Date Noted  . Itching 08/10/2018  .  Varicose veins of bilateral lower extremities with pain 03/30/2018  . Lymphedema 03/30/2018  . Dizziness 02/02/2017  . Situational depression 12/18/2016  . Health maintenance examination 10/23/2016  . Vitamin D deficiency 10/11/2016  . Fatigue 08/12/2016  . Essential hypertension 12/17/2015  . Dermatitis of external ear 12/17/2015  . Corn of foot 05/16/2015  . Advanced care planning/counseling discussion 10/30/2014  . Knee pain 08/17/2013  . Oropharyngeal dysphagia 11/15/2012  . Difficulty with family 03/30/2012  . Medicare annual wellness visit, subsequent 03/30/2012  . History of basal cell carcinoma   . Osteoarthritis   . Osteopenia   . Hypothyroidism   . Glaucoma   . Chronic venous insufficiency    Thank you for your referral.  Joneen Boers PT, DPT   05/15/2019, 5:32 PM  [facility] [address] [place], [address] Phone: [phone]   Fax:  [phone]  Name: Beth Rodgers MRN: [id number] Date of Birth: 1935-05-22

## 2019-05-16 ENCOUNTER — Ambulatory Visit (INDEPENDENT_AMBULATORY_CARE_PROVIDER_SITE_OTHER): Payer: Medicare Other | Admitting: Psychology

## 2019-05-16 DIAGNOSIS — F4323 Adjustment disorder with mixed anxiety and depressed mood: Secondary | ICD-10-CM

## 2019-05-17 ENCOUNTER — Other Ambulatory Visit: Payer: Self-pay

## 2019-05-17 ENCOUNTER — Ambulatory Visit: Payer: Medicare Other

## 2019-05-17 DIAGNOSIS — Z9181 History of falling: Secondary | ICD-10-CM

## 2019-05-17 DIAGNOSIS — M6281 Muscle weakness (generalized): Secondary | ICD-10-CM | POA: Diagnosis not present

## 2019-05-17 DIAGNOSIS — M25572 Pain in left ankle and joints of left foot: Secondary | ICD-10-CM | POA: Diagnosis not present

## 2019-05-17 DIAGNOSIS — M25562 Pain in left knee: Secondary | ICD-10-CM | POA: Diagnosis not present

## 2019-05-17 NOTE — Therapy (Signed)
[facility] [address] [place], [address] Phone: [phone]   Fax:  [phone]  Physical Therapy Treatment  Patient Details  Name: Beth Rodgers MRN: [id number] Date of Birth: 03-23-1935 Referring Provider (PT): Vickki Hearing, MD   Encounter Date: 05/17/2019  PT End of Session - 05/17/19 1409    Visit Number  26    Number of Visits  45    Date for PT Re-Evaluation  07/13/19    Authorization Type  6    Authorization Time Period  of 10 progress report    PT Start Time  1409   pt arrived late   PT Stop Time  1444    PT Time Calculation (min)  35 min    Activity Tolerance  Patient tolerated treatment well    Behavior During Therapy  [facility] for tasks assessed/performed       Past Medical History:  Diagnosis Date  . Cellulitis 08/10/2018  . Chronic venous insufficiency    with varicose veins  . Glaucoma   . History  of basal cell carcinoma    left nare  . History of chicken pox   . Hypothyroid   . Osteoarthritis    back pain, L knee pain  . Osteopenia 06/2009   DEXA 11/2015: T -2.3 hip, -2.2 spine, on longterm bisphosphonate/evista  . Pyogenic granuloma 04/16/2016    Past Surgical History:  Procedure Laterality Date  . BREAST BIOPSY     core  . CATARACT EXTRACTION     bilateral  . DEXA  06/2009   T score Spine: -1.8, hip -2.0  . Foam sclerotherapy Bilateral 09/2015   [place], [place]  . NASAL RECONSTRUCTION  2005   for BCC L nare s/p Mohs  . nuclear stress test  2014   normal in [place]  . RADIOFREQUENCY ABLATION Left 01/2012   remnant L G saphenous vein below knee  . RADIOFREQUENCY ABLATION Right 02/2013   RLE vein ablation   . VEIN LIGATION AND STRIPPING  remote   bilateral G saphenous veins    There were no vitals filed for this visit.  Subjective Assessment - 05/17/19 1413    Subjective  L heel felt better last night. The session last time helped. Was a better day yesterday.  1-2/10 L heel pain when walking.     Pertinent History  Pt states that she has fallen 2x in the past year, once when she was sitting on a stool, the other time pt tripped on her R foot. Has a difficult time getting up due to weakness.  Nervous about being by herself if she fell. Has been doing her HEP daily.  Pt wears an inflatable machine for her legs.  Pt thinks her cellulitis is gone.  Feels pain lateral knee. Difficulty getting up from the floor because has a hard time kneeling due to knee pain.   Fell 3 times in the last 6 months (once falling off a stool and 2x tripping on her foot, does not remember which foot she tripped with, both falls when she tripped, pt fell on her L side).  Pain has improved since onset a little over 3 months ago. Unknown method of injury. Pt noticed swelling in L lateral knee before her falls.  Pt also states feeling pain in her neck after working on the computer or cooking.     Patient Stated Goals  Want to be able to get stronger and be able  to get up from the floor.     Currently in Pain?  Yes    Pain Score  2     Pain Onset  More than a month ago                               PT Education - 05/17/19 1431    Education Details  ther-ex    Person(s) Educated  Patient    Methods  Explanation;Demonstration;Tactile cues    Comprehension  Returned demonstration;Verbalized understanding       Objectives  Time taken secondary to pt trying to put on mask properly  Manual therapy  Transverse friction technique L Achilles tendon and distal insertion to promote blood flow seated STM to L lateral hamstrings to decrease tension    Therapeutic exercise   After transverse friction technique to L achilles  seated L foot plantar flexion isometrics, 40 % effort x 1 minute with 1 min rest breaks for 3 times  Seated manually resisted R lateral shift to counter L lateral shift posture 10x5 seconds for 2 sets  Seated manually resisted trunk  flexion isometrics 10x2 with 5 seconds   Pt was recommended to sit on her R hip to decrease L lateral shift posture in sitting.     Improved exercise technique, movement at target joints, use of target muscles after mod verbal, visual, tactile cues.       Response to treatment Decreased L heel pain after treatment to promote posture as well as after performing cross friction technique to L Achilles and insertion followed by isometric plantarflexion exercise.  0.5-1/10 L heel pain with gait after session   Clinical impression Pt arrived late and time was taken for pt to place mask on secondary to difficulty doing so. Offered to help pt but pt did not respond to offer. Session was adjusted accordingly. Good carry over of decreased L heel pain from previous session. Continued working on transverse friction technique to improve blood flow followed by isometric gastroc/soleus muscle contraction to promote proper stress to Achilles tendon and distal insertion. Also worked on decreasing L lateral shift posture to decrease pressure to nerves from back to LE. Also worked on decreasing tension of L lateral hamstrings to promote better mechanics at the knee with standing activities. L heel pain decreased to 0.5-1/10 while walking at end of session. Pt will benefit from continued skilled physical therapy services to decrease pain, improve LE strength, femoral control, function, and decrease difficulty ambulating.           PT Short Term Goals - 01/26/19 1416      PT SHORT TERM GOAL #1   Title  Patient will be independent with her HEP to improve strength, decrease L knee pain, improve function.     Baseline  Pt performing her exercises at home per subjective reports (01/05/2019), (01/26/2019)    Time  3    Period  Weeks    Status  Achieved    Target Date  12/22/18        PT Long Term Goals - 05/15/19 1707      PT LONG TERM GOAL #1   Title  Patient will have a decrease in L knee pain to  3/10 or less at worst to promote ability to perform standing tasks such as stair negotiation more comfortably.     Baseline  7/10 L knee pain at worst for the past 3 months (  11/29/2018); 4/10 at most for the past 7 days (01/05/2019); 3/10 L knee pain at most for the past 7 days (01/09/2019), (01/26/2019); 1.5/10 L knee pain at most for the past 10 days (02/21/2019); 3/10 L knee pain at most for the past 2 weeks (05/15/2019)    Time  8    Period  Weeks    Status  Achieved    Target Date  01/26/19      PT LONG TERM GOAL #2   Title  Patient will improve bilateral LE strength by at least 1/2 MMT grade to promote ability to perform functional tasks such as stair negotiation as well as floor to stand transfers.     Time  8    Period  Weeks    Status  Partially Met    Target Date  07/13/19      PT LONG TERM GOAL #3   Title  Patient will be able to perform floor to stand transfer with mod independent to promote ability to get up from the floor if patient falls (pt goal).     Baseline  Pt needs assist to get up from the floor after a fall per subjective reports (11/29/2018). Pt needs assist with floor to stand transfers (01/26/2019)    Time  8    Period  Weeks    Status  Deferred    Target Date  07/13/19      PT LONG TERM GOAL #4   Title  Patient will improve her DGI score to at least 19/24 as a demonstration of decreased fall risk.     Baseline  15/24 (11/29/2018); 18/24 (01/09/2019); deferred secondary to L heel pain (01/26/2019); 19/24 (02/21/2019)    Time  6    Period  Weeks    Status  Achieved    Target Date  03/09/19      PT LONG TERM GOAL #5   Title  Patient will have a decrease in L heel pain to 3/10 or less at worst to promote ability to ambulate more comfortably.     Baseline  at least 7/10 L heel pain at worst (01/26/2019); 4/10 L heel pain at most for the past 7 days (02/21/2019); 8/10 at most for the past 2 weeks (05/15/2019)    Time  8    Period  Weeks    Status  Partially Met    Target Date   07/13/19            Plan - 05/17/19 1431    Clinical Impression Statement  Pt arrived late and time was taken for pt to place mask on secondary to difficulty doing so. Offered to help pt but pt did not respond to offer. Session was adjusted accordingly. Good carry over of decreased L heel pain from previous session. Continued working on transverse friction technique to improve blood flow followed by isometric gastroc/soleus muscle contraction to promote proper stress to Achilles tendon and distal insertion. Also worked on decreasing L lateral shift posture to decrease pressure to nerves from back to LE. Also worked on decreasing tension of L lateral hamstrings to promote better mechanics at the knee with standing activities. L heel pain decreased to 0.5-1/10 while walking at end of session. Pt will benefit from continued skilled physical therapy services to decrease pain, improve LE strength, femoral control, function, and decrease difficulty ambulating.     Personal Factors and Comorbidities  Age;Comorbidity 3+;Time since onset of injury/illness/exacerbation;Fitness    Comorbidities  Osteopenia, chronic venous insufficiency,  L knee, and L heel pain    Examination-Activity Limitations  Carry;Lift;Stand;Locomotion Level;Squat;Stairs    Stability/Clinical Decision Making  Evolving/Moderate complexity    Rehab Potential  Good    Clinical Impairments Affecting Rehab Potential  (-) L knee pain, LE weakness, hx of falls; (+) motivated    PT Frequency  2x / week    PT Duration  8 weeks    PT Treatment/Interventions  Aquatic Therapy;Electrical Stimulation;Iontophoresis 32m/ml Dexamethasone;Gait training;Stair training;Functional mobility training;Therapeutic activities;Therapeutic exercise;Balance training;Neuromuscular re-education;Patient/family education;Manual techniques   electrical stimulation related treatment and ultrasound if appropriate   PT Next Visit Plan  isometric plantar flexion, glute,  trunk, scapular strengthening, femoral control, balance training, manual techniques, modalities PRN    Consulted and Agree with Plan of Care  Patient       Patient will benefit from skilled therapeutic intervention in order to improve the following deficits and impairments:  Postural dysfunction, Abnormal gait, Decreased balance, Decreased range of motion, Decreased strength, Improper body mechanics  Visit Diagnosis: Left knee pain, unspecified chronicity  Muscle weakness (generalized)  History of falling  Pain in left ankle and joints of left foot     Problem List Patient Active Problem List   Diagnosis Date Noted  . Itching 08/10/2018  . Varicose veins of bilateral lower extremities with pain 03/30/2018  . Lymphedema 03/30/2018  . Dizziness 02/02/2017  . Situational depression 12/18/2016  . Health maintenance examination 10/23/2016  . Vitamin D deficiency 10/11/2016  . Fatigue 08/12/2016  . Essential hypertension 12/17/2015  . Dermatitis of external ear 12/17/2015  . Corn of foot 05/16/2015  . Advanced care planning/counseling discussion 10/30/2014  . Knee pain 08/17/2013  . Oropharyngeal dysphagia 11/15/2012  . Difficulty with family 03/30/2012  . Medicare annual wellness visit, subsequent 03/30/2012  . History of basal cell carcinoma   . Osteoarthritis   . Osteopenia   . Hypothyroidism   . Glaucoma   . Chronic venous insufficiency    MJoneen BoersPT, DPT   05/17/2019, 5:29 PM  [facility] [address] [place] [address]Phone: [phone]  Fax:  [phone] Name: KLAPORCHA MARCHESIMRN: [id number] of Birth: 503-28-1936

## 2019-05-17 NOTE — Patient Instructions (Signed)
Pt was recommended to sit on her R hip to decrease L lateral shift posture in sitting. Pt demonstrated and verbalized understanding.

## 2019-05-24 ENCOUNTER — Ambulatory Visit: Payer: Medicare Other

## 2019-05-24 ENCOUNTER — Other Ambulatory Visit: Payer: Self-pay

## 2019-05-24 DIAGNOSIS — M6281 Muscle weakness (generalized): Secondary | ICD-10-CM | POA: Diagnosis not present

## 2019-05-24 DIAGNOSIS — M25562 Pain in left knee: Secondary | ICD-10-CM

## 2019-05-24 DIAGNOSIS — Z9181 History of falling: Secondary | ICD-10-CM | POA: Diagnosis not present

## 2019-05-24 DIAGNOSIS — M25572 Pain in left ankle and joints of left foot: Secondary | ICD-10-CM

## 2019-05-24 NOTE — Therapy (Signed)
[facility] [address], [place], [address] Phone: [phone]   Fax:  [phone]  Physical Therapy Treatment  Patient Details  Name: Beth Rodgers MRN: [id number] Date of Birth: 08-16-1935 Referring Provider (PT): Vickki Hearing, MD   Encounter Date: 05/24/2019  PT End of Session - 05/24/19 1508    Visit Number  27    Number of Visits  45    Date for PT Re-Evaluation  07/13/19    Authorization Type  7    Authorization Time Period  of 10 progress report    PT Start Time  1509    PT Stop Time  1552    PT Time Calculation (min)  43 min    Activity Tolerance  Patient tolerated treatment well    Behavior During Therapy  [facility] for tasks assessed/performed       Past Medical History:  Diagnosis Date  . Cellulitis 08/10/2018  . Chronic venous insufficiency    with varicose veins  . Glaucoma   . History  of basal cell carcinoma    left nare  . History of chicken pox   . Hypothyroid   . Osteoarthritis    back pain, L knee pain  . Osteopenia 06/2009   DEXA 11/2015: T -2.3 hip, -2.2 spine, on longterm bisphosphonate/evista  . Pyogenic granuloma 04/16/2016    Past Surgical History:  Procedure Laterality Date  . BREAST BIOPSY     core  . CATARACT EXTRACTION     bilateral  . DEXA  06/2009   T score Spine: -1.8, hip -2.0  . Foam sclerotherapy Bilateral 09/2015   [place], [place]  . NASAL RECONSTRUCTION  2005   for BCC L nare s/p Mohs  . nuclear stress test  2014   normal in [place]  . RADIOFREQUENCY ABLATION Left 01/2012   remnant L G saphenous vein below knee  . RADIOFREQUENCY ABLATION Right 02/2013   RLE vein ablation   . VEIN LIGATION AND STRIPPING  remote   bilateral G saphenous veins    There were no vitals filed for this visit.  Subjective Assessment - 05/24/19 1510    Subjective  L heel is a little bit better. Has been doing her HEP when she remembers. 2.5/10 L heel currently.  Knees  have not been bothering her.  15 minutes of walking poorly causes her L heel to bother her to the point that she needs to sit down.     Pertinent History  Pt states that she has fallen 2x in the past year, once when she was sitting on a stool, the other time pt tripped on her R foot. Has a difficult time getting up due to weakness.  Nervous about being by herself if she fell. Has been doing her HEP daily.  Pt wears an inflatable machine for her legs.  Pt thinks her cellulitis is gone.  Feels pain lateral knee. Difficulty getting up from the floor because has a hard time kneeling due to knee pain.   Fell 3 times in the last 6 months (once falling off a stool and 2x tripping on her foot, does not remember which foot she tripped with, both falls when she tripped, pt fell on her L side).  Pain has improved since onset a little over 3 months ago. Unknown method of injury. Pt noticed swelling in L lateral knee before her falls.  Pt also states feeling pain in her neck after working on  the computer or cooking.     Patient Stated Goals  Want to be able to get stronger and be able to get up from the floor.     Currently in Pain?  Yes    Pain Score  3    2.5/10 L heel   Pain Onset  More than a month ago                               PT Education - 05/24/19 1520    Education Details  ther-ex    [employer]) Educated  Patient    Methods  Explanation;Demonstration;Tactile cues;Verbal cues    Comprehension  Verbalized understanding;Returned demonstration      Objectives  Time taken secondary to pt trying to put on mask properly  Manual therapy seated STM to lumbar paraspinal muscles    Decreased L heel pain to 0.5/10    Therapeutic exercise   Seated manually resisted R lateral shift to counter L lateral shift posture 10x5 seconds for 3 sets  Seated manually resisted trunk flexion isometrics 10x3 with 5 seconds    Decreased L heel pain in sitting    Seated B  scapular retraction 10x5 seconds for 2 sets to promote thoracic extension and decrease low back extension pressure  Seated manually resisted eccetric L ankle PF with PT resistance 10x2 with 2 second holds at end range PF  Decreased L heel pain to < 0.5/10  Seated manually resisted L ankle IV isometrics 8x5 seconds for 2 sets  Slight increase in L heel symptoms  Seated manually resisted L ankle PF with PT resistance again 10x with 2 second holds at end range PF  Decreased L heel pain    Improved exercise technique, movement at target joints, use of target muscles after min to mod verbal, visual, tactile cues.   Response to treatment Decreased L heel painafter treatment to promote posture as well as improving trunk muscle strength and adding eccentric gastroc/soleus muscle activation. Decreased L heel pain with gait after session.    Clinical impression Continued working on providing proper stress to L Achilles tendon to promote healing as well as decreasing low back extension pressure to nerves going down to L LE. Possible neural involvement as well. Decreased L heel pain with gait reported by pt after session. Pt will benefit from continued skilled physical therapy services to decrease pain, improve LE strength, heeling and function.    PT Short Term Goals - 01/26/19 1416      PT SHORT TERM GOAL #1   Title  Patient will be independent with her HEP to improve strength, decrease L knee pain, improve function.     Baseline  Pt performing her exercises at home per subjective reports (01/05/2019), (01/26/2019)    Time  3    Period  Weeks    Status  Achieved    Target Date  12/22/18        PT Long Term Goals - 05/15/19 1707      PT LONG TERM GOAL #1   Title  Patient will have a decrease in L knee pain to 3/10 or less at worst to promote ability to perform standing tasks such as stair negotiation more comfortably.     Baseline  7/10 L knee pain at worst for the past 3 months  (11/29/2018); 4/10 at most for the past 7 days (01/05/2019); 3/10 L knee pain at most for the past 7  days (01/09/2019), (01/26/2019); 1.5/10 L knee pain at most for the past 10 days (02/21/2019); 3/10 L knee pain at most for the past 2 weeks (05/15/2019)    Time  8    Period  Weeks    Status  Achieved    Target Date  01/26/19      PT LONG TERM GOAL #2   Title  Patient will improve bilateral LE strength by at least 1/2 MMT grade to promote ability to perform functional tasks such as stair negotiation as well as floor to stand transfers.     Time  8    Period  Weeks    Status  Partially Met    Target Date  07/13/19      PT LONG TERM GOAL #3   Title  Patient will be able to perform floor to stand transfer with mod independent to promote ability to get up from the floor if patient falls (pt goal).     Baseline  Pt needs assist to get up from the floor after a fall per subjective reports (11/29/2018). Pt needs assist with floor to stand transfers (01/26/2019)    Time  8    Period  Weeks    Status  Deferred    Target Date  07/13/19      PT LONG TERM GOAL #4   Title  Patient will improve her DGI score to at least 19/24 as a demonstration of decreased fall risk.     Baseline  15/24 (11/29/2018); 18/24 (01/09/2019); deferred secondary to L heel pain (01/26/2019); 19/24 (02/21/2019)    Time  6    Period  Weeks    Status  Achieved    Target Date  03/09/19      PT LONG TERM GOAL #5   Title  Patient will have a decrease in L heel pain to 3/10 or less at worst to promote ability to ambulate more comfortably.     Baseline  at least 7/10 L heel pain at worst (01/26/2019); 4/10 L heel pain at most for the past 7 days (02/21/2019); 8/10 at most for the past 2 weeks (05/15/2019)    Time  8    Period  Weeks    Status  Partially Met    Target Date  07/13/19            Plan - 05/24/19 1508    Clinical Impression Statement  Continued working on providing proper stress to L Achilles tendon to promote healing as  well as decreasing low back extension pressure to nerves going down to L LE. Possible neural involvement as well. Decreased L heel pain with gait reported by pt after session. Pt will benefit from continued skilled physical therapy services to decrease pain, improve LE strength, heeling and function.     Personal Factors and Comorbidities  Age;Comorbidity 3+;Time since onset of injury/illness/exacerbation;Fitness    Comorbidities  Osteopenia, chronic venous insufficiency, L knee, and L heel pain    Examination-Activity Limitations  Carry;Lift;Stand;Locomotion Level;Squat;Stairs    Stability/Clinical Decision Making  Evolving/Moderate complexity    Rehab Potential  Good    Clinical Impairments Affecting Rehab Potential  (-) L knee pain, LE weakness, hx of falls; (+) motivated    PT Frequency  2x / week    PT Duration  8 weeks    PT Treatment/Interventions  Aquatic Therapy;Electrical Stimulation;Iontophoresis '4mg'$ /ml Dexamethasone;Gait training;Stair training;Functional mobility training;Therapeutic activities;Therapeutic exercise;Balance training;Neuromuscular re-education;Patient/family education;Manual techniques   electrical stimulation related treatment and ultrasound if appropriate  PT Next Visit Plan  isometric plantar flexion, glute, trunk, scapular strengthening, femoral control, balance training, manual techniques, modalities PRN    Consulted and Agree with Plan of Care  Patient       Patient will benefit from skilled therapeutic intervention in order to improve the following deficits and impairments:  Postural dysfunction, Abnormal gait, Decreased balance, Decreased range of motion, Decreased strength, Improper body mechanics  Visit Diagnosis: Left knee pain, unspecified chronicity  Muscle weakness (generalized)  History of falling  Pain in left ankle and joints of left foot     Problem List Patient Active Problem List   Diagnosis Date Noted  . Itching 08/10/2018  .  Varicose veins of bilateral lower extremities with pain 03/30/2018  . Lymphedema 03/30/2018  . Dizziness 02/02/2017  . Situational depression 12/18/2016  . Health maintenance examination 10/23/2016  . Vitamin D deficiency 10/11/2016  . Fatigue 08/12/2016  . Essential hypertension 12/17/2015  . Dermatitis of external ear 12/17/2015  . Corn of foot 05/16/2015  . Advanced care planning/counseling discussion 10/30/2014  . Knee pain 08/17/2013  . Oropharyngeal dysphagia 11/15/2012  . Difficulty with family 03/30/2012  . Medicare annual wellness visit, subsequent 03/30/2012  . History of basal cell carcinoma   . Osteoarthritis   . Osteopenia   . Hypothyroidism   . Glaucoma   . Chronic venous insufficiency     Joneen Boers PT, DPT  05/24/2019, 4:03 PM  [facility] [address], [place], [address] Phone: [phone]   Fax:  [phone]  Name: Beth Rodgers MRN: [id number] Date of Birth: Mar 17, 1935

## 2019-05-26 ENCOUNTER — Telehealth: Payer: Self-pay

## 2019-05-26 ENCOUNTER — Ambulatory Visit (INDEPENDENT_AMBULATORY_CARE_PROVIDER_SITE_OTHER): Payer: Medicare Other

## 2019-05-26 VITALS — BP 134/76 | HR 83 | Temp 97.6°F | Wt 169.0 lb

## 2019-05-26 DIAGNOSIS — Z0000 Encounter for general adult medical examination without abnormal findings: Secondary | ICD-10-CM | POA: Diagnosis not present

## 2019-05-26 NOTE — Progress Notes (Signed)
Subjective:   Beth Rodgers is a 84 y.o. female who presents for Medicare Annual (Subsequent) preventive examination.  Review of Systems:  N/A Cardiac Risk Factors include: advanced age (>50men, >106 women);hypertension     Objective:     Vitals: BP 134/76 (BP Location: Right Arm, Patient Position: Sitting, Cuff Size: Normal) Comment: patient supplied  Pulse 83 Comment: patient supplied  Temp 97.6 F (36.4 C) (Oral) Comment: patient supplied  Wt 169 lb (76.7 kg) Comment: patient supplied  BMI 28.12 kg/m   Body mass index is 28.12 kg/m.  Advanced Directives 05/26/2019 11/29/2018 08/01/2018 03/09/2018 02/19/2017  Does Patient Have a Medical Advance Directive? No No No No No  Would patient like information on creating a medical advance directive? No - Patient declined Yes (MAU/Ambulatory/Procedural Areas - Information given) No - Patient declined No - Patient declined -    Tobacco Social History   Tobacco Use  Smoking Status Never Smoker  Smokeless Tobacco Never Used     Counseling given: No   Clinical Intake:  Pre-visit preparation completed: Yes  Pain : No/denies pain Pain Score: 0-No pain     Nutritional Status: BMI 25 -29 Overweight Nutritional Risks: None  How often do you need to have someone help you when you read instructions, pamphlets, or other written materials from your doctor or pharmacy?: 1 - Never  Interpreter Needed?: No  Comments: pt lives with spouse Information entered by :: LPinson, LPN  Past Medical History:  Diagnosis Date  . Cellulitis 08/10/2018  . Chronic venous insufficiency    with varicose veins  . Glaucoma   . History  of basal cell carcinoma    left nare  . History of chicken pox   . Hypothyroid   . Osteoarthritis    back pain, L knee pain  . Osteopenia 06/2009   DEXA 11/2015: T -2.3 hip, -2.2 spine, on longterm bisphosphonate/evista  . Pyogenic granuloma 04/16/2016   Past Surgical History:  Procedure Laterality Date  .  BREAST BIOPSY     core  . CATARACT EXTRACTION     bilateral  . DEXA  06/2009   T score Spine: -1.8, hip -2.0  . Foam sclerotherapy Bilateral 09/2015   [facility] [place], [place]  . NASAL RECONSTRUCTION  2005   for BCC L nare s/p Mohs  . nuclear stress test  2014   normal in [place]  . RADIOFREQUENCY ABLATION Left 01/2012   remnant L G saphenous vein below knee  . RADIOFREQUENCY ABLATION Right 02/2013   RLE vein ablation   . VEIN LIGATION AND STRIPPING  remote   bilateral G saphenous veins   Family History  Problem Relation Age of Onset  . Cancer Mother        thyroid cancer  . Heart disease Father        CHF  . Diabetes Father   . Glaucoma Father   . Arthritis Father   . Coronary artery disease Maternal Uncle   . Bipolar disorder Son   . Stroke Neg Hx   . Breast cancer Neg Hx    Social History   Socioeconomic History  . Marital status: Unknown    Spouse name: Not on file  . Number of children: 2  . Years of education: Not on file  . Highest education level: Not on file  Occupational History    Employer: RETIRED  Social Needs  . Financial resource strain: Not on file  . Food insecurity:    Worry: Not on  file    Inability: Not on file  . Transportation needs:    Medical: Not on file    Non-medical: Not on file  Tobacco Use  . Smoking status: Never Smoker  . Smokeless tobacco: Never Used  Substance and Sexual Activity  . Alcohol use: Yes    Comment: Occasionally drinks wine  . Drug use: No  . Sexual activity: Not Currently    Birth control/protection: Post-menopausal  Lifestyle  . Physical activity:    Days per week: Not on file    Minutes per session: Not on file  . Stress: Not on file  Relationships  . Social connections:    Talks on phone: Not on file    Gets together: Not on file    Attends religious service: Not on file    Active member of club or organization: Not on file    Attends meetings of clubs or organizations: Not on file    Relationship  status: Not on file  Other Topics Concern  . Not on file  Social History Narrative   From [place], [place]   Caffeine: 2-3 cups/day   Lives with husband, majority of time alone as he travels to [place].   Has grandchildren nearby.   Occ: Field seismologist, Metallurgist   Activity: bed exercises, limiting walking   Diet: does get fruits and vegetables, only some water    Outpatient Encounter Medications as of 05/26/2019  Medication Sig  . Ascorbic Acid (VITAMIN C CR PO) Take by mouth as directed. Reported on 12/17/2015  . augmented betamethasone dipropionate (DIPROLENE-AF) 0.05 % cream 2 (two) times daily as needed. Appy to active flares on body twice a day as needed  . CALCIUM CARBONATE PO Take 600 mg by mouth as directed. Reported on 12/17/2015  . celecoxib (CELEBREX) 100 MG capsule Take 1 capsule (100 mg total) by mouth 2 (two) times daily. (Patient taking differently: Take 100 mg by mouth daily. )  . Cholecalciferol (VITAMIN D) 2000 units CAPS Take 1 capsule (2,000 Units total) by mouth daily. (Patient taking differently: Take 1 capsule by mouth 3 (three) times a week. )  . desonide (DESOWEN) 0.05 % cream APPLY TO AFFECTED AREA TWICE A DAY (Patient taking differently: as needed. )  . halobetasol (ULTRAVATE) 0.05 % ointment APPLY TO AFFECTED AREA TWICE A DAY  . levothyroxine (SYNTHROID) 75 MCG tablet TAKE 1 TABLET BY MOUTH EVERY DAY  . losartan (COZAAR) 100 MG tablet TAKE 1 TABLET BY MOUTH EVERY DAY  . Multiple Vitamin (MULTIVITAMIN) tablet Take 1 tablet by mouth as needed. Reported on 12/17/2015  . Netarsudil-Latanoprost (ROCKLATAN) 0.02-0.005 % SOLN Rocklatan 0.02 %-0.005 % eye drops  APPLY 1 DROP(S) IN BOTH EYES ONCE IN THE EVENING FOR 30 DAYS  . NITROSTAT 0.4 MG SL tablet every 5 (five) minutes as needed. Reported on 04/16/2016  . [DISCONTINUED] cephALEXin (KEFLEX) 500 MG capsule Take 1 capsule (500 mg total) by mouth 3 (three) times daily.  . [DISCONTINUED] Travoprost, BAK Free,  (TRAVATAN) 0.004 % SOLN ophthalmic solution Place 1 drop into both eyes at bedtime.  Marland Kitchen CICLOPIROX EX Apply topically as needed. Reported on 12/17/2015  . diclofenac sodium (VOLTAREN) 1 % GEL Apply 1 application topically 3 (three) times daily. (Patient not taking: Reported on 11/29/2018)   No facility-administered encounter medications on file as of 05/26/2019.     Activities of Daily Living In your present state of health, do you have any difficulty performing the following activities: 05/26/2019  Hearing? N  Vision? N  Difficulty concentrating or making decisions? N  Walking or climbing stairs? N  Dressing or bathing? N  Doing errands, shopping? N  Preparing Food and eating ? N  Using the Toilet? N  In the past six months, have you accidently leaked urine? N  Do you have problems with loss of bowel control? N  Managing your Medications? N  Managing your Finances? N  Housekeeping or managing your Housekeeping? N  Some recent data might be hidden    Patient Care Team: Ria Bush, MD as PCP - General (Family Medicine)    Assessment:   This is a routine wellness examination for [place].  Vision Screening Comments: Vision exam in Dec 2019 with Dr. Edison Pace  Exercise Activities and Dietary recommendations Current Exercise Habits: Home exercise routine, Type of exercise: stretching, Time (Minutes): 35, Frequency (Times/Week): 3, Weekly Exercise (Minutes/Week): 105, Intensity: Mild, Exercise limited by: orthopedic condition(s)  Goals    . Patient Stated     Starting 05/26/19, I will continue to stretch for 35 minutes every other day.        Fall Risk Fall Risk  05/26/2019 03/09/2018 02/19/2017 10/23/2016 12/17/2015  Falls in the past year? 1 No No No No  Comment fell in hospital - - - -  Number falls in past yr: 0 - - - -  Injury with Fall? 1 - - - -   Depression Screen PHQ 2/9 Scores 05/26/2019 03/09/2018 02/19/2017 12/18/2016  PHQ - 2 Score 0 0 0 2  PHQ- 9 Score 0 0 - 3      Cognitive Function MMSE - Mini Mental State Exam 05/26/2019 03/09/2018 02/19/2017  Orientation to time 5 5 5   Orientation to Place 5 5 5   Registration 3 3 3   Attention/ Calculation 0 0 0  Recall 3 3 3   Language- name 2 objects 0 0 0  Language- repeat 1 1 1   Language- follow 3 step command 0 3 3  Language- read & follow direction 0 0 0  Write a sentence 0 0 0  Copy design 0 0 0  Total score 17 20 20      PLEASE NOTE: A Mini-Cog screen was completed. Maximum score is 17. A value of 0 denotes this part of Folstein MMSE was not completed or the patient failed this part of the Mini-Cog screening.   Mini-Cog Screening Orientation to Time - Max 5 pts Orientation to Place - Max 5 pts Registration - Max 3 pts Recall - Max 3 pts Language Repeat - Max 1 pts    Immunization History  Administered Date(s) Administered  . Hep A / Hep B 06/05/2015, 12/17/2015  . Influenza Split 11/15/2012  . Influenza, High Dose Seasonal PF 11/14/2014  . Influenza,inj,Quad PF,6+ Mos 09/06/2013, 12/18/2016, 10/05/2018  . Pneumococcal Conjugate-13 10/30/2014  . Pneumococcal Polysaccharide-23 11/15/2012  . Td 05/24/2012  . Zoster 07/03/2014   Screening Tests Health Maintenance  Topic Date Due  . DTaP/Tdap/Td (1 - Tdap) 05/24/2022 (Originally 05/13/1954)  . INFLUENZA VACCINE  07/29/2019  . TETANUS/TDAP  05/24/2022  . DEXA SCAN  Completed  . PNA vac Low Risk Adult  Completed   Plan:     I have personally reviewed, addressed, and noted the following in the patient's chart:  A. Medical and social history B. Use of alcohol, tobacco or illicit drugs  C. Current medications and supplements D. Functional ability and status E.  Nutritional status F.  Physical activity G. Advance directives H. List of other physicians  I.  Hospitalizations, surgeries, and ER visits in previous 12 months J.  Vitals (unless it is a telemedicine encounter) K. Screenings to include cognitive, depression, hearing, vision (NOTE:  hearing and vision screenings not completed in telemedicine encounter) L. Referrals and appointments   In addition, I have reviewed and discussed with patient certain preventive protocols, quality metrics, and best practice recommendations. A written personalized care plan for preventive services and recommendations were provided to patient.  With patient's permission, we connected on 05/26/19 at  2:00 PM EDTInteractive audio and video telecommunications were attempted with patient. This attempt was unsuccessful due to patient having technical difficulties OR patient did not have access to video capability.  Encounter was completed with audio only.  Two patient identifiers were used to ensure the encounter occurred with the correct person.   Patient was in home and writer was in office.   Signed,   Lindell Noe, MHA, BS, LPN Health Coach

## 2019-05-26 NOTE — Patient Instructions (Signed)
Beth Rodgers , Thank you for taking time to come for your Medicare Wellness Visit. I appreciate your ongoing commitment to your health goals. Please review the following plan we discussed and let me know if I can assist you in the future.   These are the goals we discussed: Goals    . Patient Stated     Starting 05/26/19, I will continue to stretch for 35 minutes every other day.        This is a list of the screening recommended for you and due dates:  Health Maintenance  Topic Date Due  . DTaP/Tdap/Td vaccine (1 - Tdap) 05/24/2022*  . Flu Shot  07/29/2019  . Tetanus Vaccine  05/24/2022  . DEXA scan (bone density measurement)  Completed  . Pneumonia vaccines  Completed  *Topic was postponed. The date shown is not the original due date.   Preventive Care for Adults  A healthy lifestyle and preventive care can promote health and wellness. Preventive health guidelines for adults include the following key practices.  . A routine yearly physical is a good way to check with your health care provider about your health and preventive screening. It is a chance to share any concerns and updates on your health and to receive a thorough exam.  . Visit your dentist for a routine exam and preventive care every 6 months. Brush your teeth twice a day and floss once a day. Good oral hygiene prevents tooth decay and gum disease.  . The frequency of eye exams is based on your age, health, family medical history, use  of contact lenses, and other factors. Follow your health care provider's recommendations for frequency of eye exams.  . Eat a healthy diet. Foods like vegetables, fruits, whole grains, low-fat dairy products, and lean protein foods contain the nutrients you need without too many calories. Decrease your intake of foods high in solid fats, added sugars, and salt. Eat the right amount of calories for you. Get information about a proper diet from your health care provider, if necessary.  .  Regular physical exercise is one of the most important things you can do for your health. Most adults should get at least 150 minutes of moderate-intensity exercise (any activity that increases your heart rate and causes you to sweat) each week. In addition, most adults need muscle-strengthening exercises on 2 or more days a week.  Silver Sneakers may be a benefit available to you. To determine eligibility, you may visit the website: www.silversneakers.com or contact program at (613)705-4893 Mon-Fri between 8AM-8PM.   . Maintain a healthy weight. The body mass index (BMI) is a screening tool to identify possible weight problems. It provides an estimate of body fat based on height and weight. Your health care provider can find your BMI and can help you achieve or maintain a healthy weight.   For adults 20 years and older: ? A BMI below 18.5 is considered underweight. ? A BMI of 18.5 to 24.9 is normal. ? A BMI of 25 to 29.9 is considered overweight. ? A BMI of 30 and above is considered obese.   . Maintain normal blood lipids and cholesterol levels by exercising and minimizing your intake of saturated fat. Eat a balanced diet with plenty of fruit and vegetables. Blood tests for lipids and cholesterol should begin at age 64 and be repeated every 5 years. If your lipid or cholesterol levels are high, you are over 50, or you are at high risk for heart disease,  you may need your cholesterol levels checked more frequently. Ongoing high lipid and cholesterol levels should be treated with medicines if diet and exercise are not working.  . If you smoke, find out from your health care provider how to quit. If you do not use tobacco, please do not start.  . If you choose to drink alcohol, please do not consume more than 2 drinks per day. One drink is considered to be 12 ounces (355 mL) of beer, 5 ounces (148 mL) of wine, or 1.5 ounces (44 mL) of liquor.  . If you are 67-10 years old, ask your health care  provider if you should take aspirin to prevent strokes.  . Use sunscreen. Apply sunscreen liberally and repeatedly throughout the day. You should seek shade when your shadow is shorter than you. Protect yourself by wearing long sleeves, pants, a wide-brimmed hat, and sunglasses year round, whenever you are outdoors.  . Once a month, do a whole body skin exam, using a mirror to look at the skin on your back. Tell your health care provider of new moles, moles that have irregular borders, moles that are larger than a pencil eraser, or moles that have changed in shape or color.

## 2019-05-26 NOTE — Progress Notes (Signed)
PCP notes:   Health maintenance:  No gaps identified.  Abnormal screenings:   Fall risk - hx of single fall Fall Risk  05/26/2019 03/09/2018 02/19/2017 10/23/2016 12/17/2015  Falls in the past year? 1 No No No No  Comment fell in hospital - - - -  Number falls in past yr: 0 - - - -  Injury with Fall? 1 - - - -   Patient concerns:   Chronic heel pain  Nurse concerns:  None  Next PCP appt:   05/29/19 @ 1500

## 2019-05-26 NOTE — Telephone Encounter (Signed)
During AWV, patient requested refill for Diclofenac Gel for heel pain.  Last filled 08/18/2018. No refills.  [facility], [place], [place].

## 2019-05-29 ENCOUNTER — Encounter: Payer: Self-pay | Admitting: Family Medicine

## 2019-05-29 ENCOUNTER — Ambulatory Visit (INDEPENDENT_AMBULATORY_CARE_PROVIDER_SITE_OTHER): Payer: Medicare Other | Admitting: Family Medicine

## 2019-05-29 ENCOUNTER — Ambulatory Visit: Payer: Medicare Other | Attending: Sports Medicine

## 2019-05-29 ENCOUNTER — Other Ambulatory Visit: Payer: Self-pay

## 2019-05-29 VITALS — BP 175/91 | HR 68 | Temp 97.6°F | Ht 65.0 in | Wt 169.0 lb

## 2019-05-29 DIAGNOSIS — M25572 Pain in left ankle and joints of left foot: Secondary | ICD-10-CM | POA: Insufficient documentation

## 2019-05-29 DIAGNOSIS — M6281 Muscle weakness (generalized): Secondary | ICD-10-CM | POA: Diagnosis not present

## 2019-05-29 DIAGNOSIS — E039 Hypothyroidism, unspecified: Secondary | ICD-10-CM

## 2019-05-29 DIAGNOSIS — M8580 Other specified disorders of bone density and structure, unspecified site: Secondary | ICD-10-CM | POA: Diagnosis not present

## 2019-05-29 DIAGNOSIS — E559 Vitamin D deficiency, unspecified: Secondary | ICD-10-CM | POA: Diagnosis not present

## 2019-05-29 DIAGNOSIS — Z9181 History of falling: Secondary | ICD-10-CM

## 2019-05-29 DIAGNOSIS — I83813 Varicose veins of bilateral lower extremities with pain: Secondary | ICD-10-CM | POA: Diagnosis not present

## 2019-05-29 DIAGNOSIS — F4321 Adjustment disorder with depressed mood: Secondary | ICD-10-CM | POA: Diagnosis not present

## 2019-05-29 DIAGNOSIS — M25562 Pain in left knee: Secondary | ICD-10-CM | POA: Insufficient documentation

## 2019-05-29 DIAGNOSIS — Z639 Problem related to primary support group, unspecified: Secondary | ICD-10-CM

## 2019-05-29 DIAGNOSIS — I10 Essential (primary) hypertension: Secondary | ICD-10-CM

## 2019-05-29 MED ORDER — LOSARTAN POTASSIUM 100 MG PO TABS: 100.0000 mg | ORAL_TABLET | Freq: Every day | ORAL | 1 refills | Status: DC

## 2019-05-29 MED ORDER — LEVOTHYROXINE SODIUM 75 MCG PO TABS: 75.0000 ug | ORAL_TABLET | Freq: Every day | ORAL | 1 refills | Status: DC

## 2019-05-29 MED ORDER — VITAMIN D3 1.25 MG (50000 UT) PO TABS: 1.0000 | ORAL_TABLET | ORAL | 1 refills | Status: DC

## 2019-05-29 MED ORDER — DICLOFENAC SODIUM 1 % TD GEL: 1.0000 "application " | Freq: Three times a day (TID) | TRANSDERMAL | 0 refills | Status: DC

## 2019-05-29 NOTE — Assessment & Plan Note (Addendum)
Chronic, low normal. Pt states overall feeling well. Will not change dosing at this time.

## 2019-05-29 NOTE — Patient Instructions (Signed)
Medbridge Access Code: [email or web address]  Isometric shoulder adduction 10x2 with 5 second holds R UE

## 2019-05-29 NOTE — Assessment & Plan Note (Signed)
Trouble remembering to take vit d 2000 IU daily - will Rx 50,000 IU weekly for 6 months.

## 2019-05-29 NOTE — Assessment & Plan Note (Signed)
Support provided. 

## 2019-05-29 NOTE — Assessment & Plan Note (Signed)
BP markedly elevated today - some improvement after sitting and resting. Attributes to increased stress with son's legal troubles.

## 2019-05-29 NOTE — Telephone Encounter (Signed)
Refilled. This is now OTC.

## 2019-05-29 NOTE — Progress Notes (Signed)
Beth Rodgers - 84 y.o. female  MRN [id number]  Date of Birth: Oct 14, 1935  PCP: Ria Bush, MD  This service was provided via telemedicine. Phone Visit performed on 05/29/2019    Rationale for phone visit along with limitations reviewed. Patient consented to telephone encounter.    Location of patient: home Location of provider: office, [place] @ [facility] Name of referring provider: N/A   Names of persons and role in encounter: Provider: Ria Bush, MD  Patient: Beth Rodgers  Other: N/A   Time on call: 3:00pm - 3:30pm   Subjective: Chief Complaint  Patient presents with  . Annual Exam    Pt 2,      HPI:  Saw Lesia last week for medicare wellness visit. Note reviewed.    High stress day today. Significant trouble with bipolar son - currently detained by police. Attributes elevated BP to this.   She has started PT for heel spur.   She has been staying at home and avoiding crowded places.  Preventative: Declines colonoscopy, stool kits normal to date. Age out.  Mammogram at [facility] 11/2015 - Birads1. Declines repeat. Advised to call [place].  Well woman with Karoline Caldwell - has not recently seen. Denies pelvic pain, pressure, or vaginal bleeding or skin changes.  DEXA Date: 06/2009 T score Spine: -1.8, hip -2.0. 11/2015 persistent osteopenia T -2.3. Fosamax/evista has been on hold.  Flu shot - rec yearly  Td 04/2012  Pneumovax 10/2012. prevnar 2015  zostavax 06/2014  shingrix - discussed  Advanced directives: packet received 3 yrs ago, has not had chance to complete. She still has this at home. Will review. Would want husband to be HCPOA.  Seat belt use discussed Sunscreen use discussed. Denies changing moles. Sees derm regularly Non smoker  Alcohol - seldom  Dentist seen in [place]. Uses night guard.  Eye exam Q6 mo  Caffeine: 2-3 cups/day  Lives with husband, majority of time alone as he travels to [place]  Has grandchildren nearby  Occ: Research officer, trade union, Metallurgist  Activity: bed exercises, limited walking  Diet: does get fruits and vegetables, only some water    Objective/Observations:   No physical exam or vital signs collected unless specifically identified below.   BP (!) 175/91 (BP Location: Left Arm, Cuff Size: Normal)   Pulse 68   Temp 97.6 F (36.4 C) (Oral)   Ht 5\' 5"  (1.651 m)   Wt 169 lb (76.7 kg)   BMI 28.12 kg/m    Respiratory status: speaks in complete sentences without evident shortness of breath.   Assessment/Plan:  Hypothyroidism Chronic, low normal. Pt states overall feeling well. Will not change dosing at this time.   Essential hypertension BP markedly elevated today - some improvement after sitting and resting. Attributes to increased stress with son's legal troubles.   Osteopenia Encouraged regular weight bearing exercises and regular cal/vit D daily.   Difficulty with family Support provided.  Vitamin D deficiency Trouble remembering to take vit d 2000 IU daily - will Rx 50,000 IU weekly for 6 months.   Situational depression Support provided. Ongoing stressors with son. She is seeing counselor Alene Mires with benefit.    I discussed the assessment and treatment plan with the patient. The patient was provided an opportunity to ask questions and all were answered. The patient agreed with the plan and demonstrated an understanding of the instructions.  Lab Orders  No laboratory test(s) ordered today    Meds ordered this encounter  Medications  .  levothyroxine (SYNTHROID) 75 MCG tablet    Sig: Take 1 tablet (75 mcg total) by mouth daily.    Dispense:  90 tablet    Refill:  1  . losartan (COZAAR) 100 MG tablet    Sig: Take 1 tablet (100 mg total) by mouth daily.    Dispense:  90 tablet    Refill:  1  . Cholecalciferol (VITAMIN D3) 1.25 MG (50000 UT) TABS    Sig: Take 1 tablet by mouth once a week.    Dispense:  12 tablet    Refill:  1    The patient was advised to call back or  seek an in-person evaluation if the symptoms worsen or if the condition fails to improve as anticipated.  Ria Bush, MD

## 2019-05-29 NOTE — Assessment & Plan Note (Signed)
Support provided. Ongoing stressors with son. She is seeing counselor Alene Mires with benefit.

## 2019-05-29 NOTE — Therapy (Signed)
[facility] [address], [place], [address] Phone: [phone]   Fax:  [phone]  Physical Therapy Treatment  Patient Details  Name: Beth Rodgers MRN: [id number] Date of Birth: 21-Nov-1935 Referring Provider (PT): Vickki Hearing, MD   Encounter Date: 05/29/2019  PT End of Session - 05/29/19 1008    Visit Number  28    Number of Visits  45    Date for PT Re-Evaluation  07/13/19    Authorization Type  8    Authorization Time Period  of 10 progress report    PT Start Time  1008   pt arrived late   PT Stop Time  1059    PT Time Calculation (min)  51 min    Activity Tolerance  Patient tolerated treatment well    Behavior During Therapy  [facility] for tasks assessed/performed       Past Medical History:  Diagnosis Date  . Cellulitis 08/10/2018  . Chronic venous insufficiency    with varicose veins  . Glaucoma   . History  of basal cell carcinoma    left nare  . History of chicken pox   . Hypothyroid   . Osteoarthritis    back pain, L knee pain  . Osteopenia 06/2009   DEXA 11/2015: T -2.3 hip, -2.2 spine, on longterm bisphosphonate/evista  . Pyogenic granuloma 04/16/2016    Past Surgical History:  Procedure Laterality Date  . BREAST BIOPSY     core  . CATARACT EXTRACTION     bilateral  . DEXA  06/2009   T score Spine: -1.8, hip -2.0  . Foam sclerotherapy Bilateral 09/2015   [place], [place]  . NASAL RECONSTRUCTION  2005   for BCC L nare s/p Mohs  . nuclear stress test  2014   normal in [place]  . RADIOFREQUENCY ABLATION Left 01/2012   remnant L G saphenous vein below knee  . RADIOFREQUENCY ABLATION Right 02/2013   RLE vein ablation   . VEIN LIGATION AND STRIPPING  remote   bilateral G saphenous veins    There were no vitals filed for this visit.  Subjective Assessment - 05/29/19 1009    Subjective  L heel is a 2.5/10 currently. L heel was good for a day or so after last session. Last  night was a bad night and her heel woke her up last night. Pt was on her R side when it woke her up.   L heel is swolen. Has not been doing anything strenuous. Only did her seated ankle PF isometrics once a day.  Also only does 1-2 repetitions of her PF isometrics at 40% effort once a day.  L knee started bothering her again yesterday and today. Other days, pt has not felt it.  2.5/10  L knee pain    Pertinent History  Pt states that she has fallen 2x in the past year, once when she was sitting on a stool, the other time pt tripped on her R foot. Has a difficult time getting up due to weakness.  Nervous about being by herself if she fell. Has been doing her HEP daily.  Pt wears an inflatable machine for her legs.  Pt thinks her cellulitis is gone.  Feels pain lateral knee. Difficulty getting up from the floor because has a hard time kneeling due to knee pain.   Fell 3 times in the last 6 months (once falling off a stool and 2x tripping on  her foot, does not remember which foot she tripped with, both falls when she tripped, pt fell on her L side).  Pain has improved since onset a little over 3 months ago. Unknown method of injury. Pt noticed swelling in L lateral knee before her falls.  Pt also states feeling pain in her neck after working on the computer or cooking.     Patient Stated Goals  Want to be able to get stronger and be able to get up from the floor.     Currently in Pain?  Yes    Pain Score  3    2.5/10   Pain Onset  More than a month ago                               PT Education - 05/29/19 1029    Education Details  ther-ex, HEP    Person(s) Educated  Patient    Methods  Explanation;Demonstration;Tactile cues;Verbal cues;Handout    Comprehension  Returned demonstration;Verbalized understanding      Objectives   Medbridge Access Code: [email or web address]   Manual therapy Seated STM L lateral hamstrings to decrease tension and promote better mechanics at her knee  joint.   Difficulty with knee extension afterwards  Try supine gentle L femur IR joint mobilization grade 2 to 3- next visit if appropriate   Therapeutic exercise   Emphasized importance of performing her HEP (ankle PF isometrics) fully and consistently to see progress. Pt verbalized understanding.   Pt was recommended to sleep on her L side or on her back with knees flexed secondary to possible increase in symptoms and increase L lateral shift if pt sleeps on her R side. Pt verbalized understanding.   Seated manually resisted R lateral shift to counter L lateral shift posture 10x5 seconds for 3 sets  Seated R shoulder adduction pillow squeeze (decreased R lumbar paraspinal muscle tension palapted) 10x2 with 5 second holds   Decreased L heel pain with aforementioned exercises to 0.5/10  Seated manually resisted eccetric L ankle PF with PT resistance 10x3 with 2 second holds at end range PF              Seated L hip ER 10x2   Slight L lateral knee discomfort which eases with rest.  L anterior knee discomfort with knee extension afterwards.    Seated hip adduction pillow squeeze 10x5 seconds for 3 sets   Slight decrease in L knee pain afterwards with knee extension  Try seated L hip extension isometrics or manually resisted leg press next session if appropriate    Improved exercise technique, movement at target joints, use of target muscles after min to mod verbal, visual, tactile cues.   Response to treatment Decreaed L heel pain after session. Increased L knee pain with hip ER exercise, decreased with hip adduction pillow squeeze.      Clinical impression Continued working on decreasing L lateral shift posture, improving trunk strengthening and promoting eccentric loading to L Achilles to promote healing as well as decrease heel pain. Decreased L heel pain with aforementioned treatment. Increased L knee pain around lateral meniscus area with knee extension following  seated L hip ER exercise. Decreased with seated hip adductor pillow squeeze. Fair tolerance to L knee pain with exercises today. Decreased L heel symptoms. Pt will benefit from continued skilled physical therapy services to decrease pain, improve strength, function, and ability to ambulate.  PT Short Term Goals - 01/26/19 1416      PT SHORT TERM GOAL #1   Title  Patient will be independent with her HEP to improve strength, decrease L knee pain, improve function.     Baseline  Pt performing her exercises at home per subjective reports (01/05/2019), (01/26/2019)    Time  3    Period  Weeks    Status  Achieved    Target Date  12/22/18        PT Long Term Goals - 05/15/19 1707      PT LONG TERM GOAL #1   Title  Patient will have a decrease in L knee pain to 3/10 or less at worst to promote ability to perform standing tasks such as stair negotiation more comfortably.     Baseline  7/10 L knee pain at worst for the past 3 months (11/29/2018); 4/10 at most for the past 7 days (01/05/2019); 3/10 L knee pain at most for the past 7 days (01/09/2019), (01/26/2019); 1.5/10 L knee pain at most for the past 10 days (02/21/2019); 3/10 L knee pain at most for the past 2 weeks (05/15/2019)    Time  8    Period  Weeks    Status  Achieved    Target Date  01/26/19      PT LONG TERM GOAL #2   Title  Patient will improve bilateral LE strength by at least 1/2 MMT grade to promote ability to perform functional tasks such as stair negotiation as well as floor to stand transfers.     Time  8    Period  Weeks    Status  Partially Met    Target Date  07/13/19      PT LONG TERM GOAL #3   Title  Patient will be able to perform floor to stand transfer with mod independent to promote ability to get up from the floor if patient falls (pt goal).     Baseline  Pt needs assist to get up from the floor after a fall per subjective reports (11/29/2018). Pt needs assist with floor to stand transfers (01/26/2019)    Time  8     Period  Weeks    Status  Deferred    Target Date  07/13/19      PT LONG TERM GOAL #4   Title  Patient will improve her DGI score to at least 19/24 as a demonstration of decreased fall risk.     Baseline  15/24 (11/29/2018); 18/24 (01/09/2019); deferred secondary to L heel pain (01/26/2019); 19/24 (02/21/2019)    Time  6    Period  Weeks    Status  Achieved    Target Date  03/09/19      PT LONG TERM GOAL #5   Title  Patient will have a decrease in L heel pain to 3/10 or less at worst to promote ability to ambulate more comfortably.     Baseline  at least 7/10 L heel pain at worst (01/26/2019); 4/10 L heel pain at most for the past 7 days (02/21/2019); 8/10 at most for the past 2 weeks (05/15/2019)    Time  8    Period  Weeks    Status  Partially Met    Target Date  07/13/19            Plan - 05/29/19 1002    Clinical Impression Statement  Continued working on decreasing L lateral shift posture, improving trunk strengthening and  promoting eccentric loading to L Achilles to promote healing as well as decrease heel pain. Decreased L heel pain with aforementioned treatment. Increased L knee pain around lateral meniscus area with knee extension following seated L hip ER exercise. Decreased with seated hip adductor pillow squeeze. Fair tolerance to L knee pain with exercises today. Decreased L heel symptoms. Pt will benefit from continued skilled physical therapy services to decrease pain, improve strength, function, and ability to ambulate.     Personal Factors and Comorbidities  Age;Comorbidity 3+;Time since onset of injury/illness/exacerbation;Fitness    Comorbidities  Osteopenia, chronic venous insufficiency, L knee, and L heel pain    Examination-Activity Limitations  Carry;Lift;Stand;Locomotion Level;Squat;Stairs    Stability/Clinical Decision Making  Evolving/Moderate complexity    Rehab Potential  Good    Clinical Impairments Affecting Rehab Potential  (-) L knee pain, LE weakness, hx  of falls; (+) motivated    PT Frequency  2x / week    PT Duration  8 weeks    PT Treatment/Interventions  Aquatic Therapy;Electrical Stimulation;Iontophoresis 76m/ml Dexamethasone;Gait training;Stair training;Functional mobility training;Therapeutic activities;Therapeutic exercise;Balance training;Neuromuscular re-education;Patient/family education;Manual techniques   electrical stimulation related treatment and ultrasound if appropriate   PT Next Visit Plan  isometric plantar flexion, glute, trunk, scapular strengthening, femoral control, balance training, manual techniques, modalities PRN    Consulted and Agree with Plan of Care  Patient       Patient will benefit from skilled therapeutic intervention in order to improve the following deficits and impairments:  Postural dysfunction, Abnormal gait, Decreased balance, Decreased range of motion, Decreased strength, Improper body mechanics  Visit Diagnosis: Pain in left ankle and joints of left foot  Left knee pain, unspecified chronicity  Muscle weakness (generalized)  History of falling     Problem List Patient Active Problem List   Diagnosis Date Noted  . Itching 08/10/2018  . Varicose veins of bilateral lower extremities with pain 03/30/2018  . Lymphedema 03/30/2018  . Dizziness 02/02/2017  . Situational depression 12/18/2016  . Health maintenance examination 10/23/2016  . Vitamin D deficiency 10/11/2016  . Fatigue 08/12/2016  . Essential hypertension 12/17/2015  . Dermatitis of external ear 12/17/2015  . Corn of foot 05/16/2015  . Advanced care planning/counseling discussion 10/30/2014  . Knee pain 08/17/2013  . Oropharyngeal dysphagia 11/15/2012  . Difficulty with family 03/30/2012  . Medicare annual wellness visit, subsequent 03/30/2012  . History of basal cell carcinoma   . Osteoarthritis   . Osteopenia   . Hypothyroidism   . Glaucoma   . Chronic venous insufficiency     MJoneen BoersPT, DPT   05/29/2019,  12:45 PM  [facility] [address] [place] [address]Phone: [phone]  Fax:  [phone] Name: KSILENA WYSSMRN: [id number] of Birth: [birth year]

## 2019-05-29 NOTE — Assessment & Plan Note (Signed)
Encouraged regular weight bearing exercises and regular cal/vit D daily.

## 2019-05-30 ENCOUNTER — Ambulatory Visit (INDEPENDENT_AMBULATORY_CARE_PROVIDER_SITE_OTHER): Payer: Medicare Other | Admitting: Psychology

## 2019-05-30 DIAGNOSIS — F4323 Adjustment disorder with mixed anxiety and depressed mood: Secondary | ICD-10-CM | POA: Diagnosis not present

## 2019-05-31 ENCOUNTER — Ambulatory Visit: Payer: Medicare Other

## 2019-06-05 ENCOUNTER — Ambulatory Visit: Payer: Medicare Other

## 2019-06-05 ENCOUNTER — Other Ambulatory Visit: Payer: Self-pay

## 2019-06-05 DIAGNOSIS — M6281 Muscle weakness (generalized): Secondary | ICD-10-CM

## 2019-06-05 DIAGNOSIS — M25562 Pain in left knee: Secondary | ICD-10-CM

## 2019-06-05 DIAGNOSIS — M25572 Pain in left ankle and joints of left foot: Secondary | ICD-10-CM | POA: Diagnosis not present

## 2019-06-05 DIAGNOSIS — Z9181 History of falling: Secondary | ICD-10-CM | POA: Diagnosis not present

## 2019-06-05 NOTE — Therapy (Signed)
[facility] [address] [place], [address] Phone: [phone]   Fax:  [phone]  Physical Therapy Treatment  Patient Details  Name: Beth Rodgers MRN: [id number] Date of Birth: 05/15/1935 Referring Provider (PT): Vickki Hearing, MD   Encounter Date: 06/05/2019  PT End of Session - 06/05/19 1409    Visit Number  29    Number of Visits  45    Date for PT Re-Evaluation  07/13/19    Authorization Type  9    Authorization Time Period  of 10 progress report    PT Start Time  1410   pt arrived late   PT Stop Time  1453    PT Time Calculation (min)  43 min    Activity Tolerance  Patient tolerated treatment well    Behavior During Therapy  [facility] for tasks assessed/performed       Past Medical History:  Diagnosis Date  . Cellulitis 08/10/2018  . Chronic venous insufficiency    with varicose veins  . Glaucoma   . History  of basal cell carcinoma    left nare  . History of chicken pox   . Hypothyroid   . Osteoarthritis    back pain, L knee pain  . Osteopenia 06/2009   DEXA 11/2015: T -2.3 hip, -2.2 spine, on longterm bisphosphonate/evista  . Pyogenic granuloma 04/16/2016    Past Surgical History:  Procedure Laterality Date  . BREAST BIOPSY     core  . CATARACT EXTRACTION     bilateral  . DEXA  06/2009   T score Spine: -1.8, hip -2.0  . Foam sclerotherapy Bilateral 09/2015   [place], [place]  . NASAL RECONSTRUCTION  2005   for BCC L nare s/p Mohs  . nuclear stress test  2014   normal in [place]  . RADIOFREQUENCY ABLATION Left 01/2012   remnant L G saphenous vein below knee  . RADIOFREQUENCY ABLATION Right 02/2013   RLE vein ablation   . VEIN LIGATION AND STRIPPING  remote   bilateral G saphenous veins    There were no vitals filed for this visit.  Subjective Assessment - 06/05/19 1412    Subjective  Pt states that her husband is concerned about pt and COVID. Does not know if she can  continue coming. L knee has not allowed her to do anything. 3.5/10 L knee pain when walking.  L heel is also 3.5/10 as well. Does not do her heel HEP as much as she should but does them when she remembers.     Pertinent History  Pt states that she has fallen 2x in the past year, once when she was sitting on a stool, the other time pt tripped on her R foot. Has a difficult time getting up due to weakness.  Nervous about being by herself if she fell. Has been doing her HEP daily.  Pt wears an inflatable machine for her legs.  Pt thinks her cellulitis is gone.  Feels pain lateral knee. Difficulty getting up from the floor because has a hard time kneeling due to knee pain.   Fell 3 times in the last 6 months (once falling off a stool and 2x tripping on her foot, does not remember which foot she tripped with, both falls when she tripped, pt fell on her L side).  Pain has improved since onset a little over 3 months ago. Unknown method of injury. Pt noticed swelling in L lateral knee  before her falls.  Pt also states feeling pain in her neck after working on the computer or cooking.     Patient Stated Goals  Want to be able to get stronger and be able to get up from the floor.     Currently in Pain?  Yes    Pain Score  4    3.5/10 L knee and heel pain   Pain Onset  More than a month ago                               PT Education - 06/05/19 1503    Education Details  continue performing her seated ankle PF isometric exercise fully and daily to help with her heel pain    Person(s) Educated  Patient    Methods  Explanation    Comprehension  Verbalized understanding        Objectives  Manual therapy seated STM to lumbar paraspinal and quadratus lumborum muscles         decreased knee pain in sitting to 1/10   Supine gentle L tibial IR grade 1 to 3- for pain control   No L knee pain with gait, just heel pain left   Seated STM L heel and Achilles tendon area   Seated  STM L plantar fascia     Pt was recommended to perform her L foot plantar flexion isometrics secondary to pt mentioning that she does not perform the HEP. Pt verbalized understanding.                 Response to treatment Decreased L knee and heel pain to 2/10 with gait at end of session. Pt states that her L knee and heel feel a lot better after treatment.   Clinical impression Focused more on manual therapy today to help with pain control of both L knee and L heel. Decreased L knee pain with manual therapy to decrease tension to lumbar paraspinal and quadratus lumborum muscles, gentle L tibial IR for pain control, as well as with STM to L Achilles, heel and plantar fascia tissues to promote mobility. Decreased L knee and heel pain reported by pt at end of session as she was leaving the clinic. Pt will benefit from continued skilled physical therapy services to decrease L knee and heel pain, improve LE strength, function, and decrease difficulty ambulating.        PT Short Term Goals - 01/26/19 1416      PT SHORT TERM GOAL #1   Title  Patient will be independent with her HEP to improve strength, decrease L knee pain, improve function.     Baseline  Pt performing her exercises at home per subjective reports (01/05/2019), (01/26/2019)    Time  3    Period  Weeks    Status  Achieved    Target Date  12/22/18        PT Long Term Goals - 05/15/19 1707      PT LONG TERM GOAL #1   Title  Patient will have a decrease in L knee pain to 3/10 or less at worst to promote ability to perform standing tasks such as stair negotiation more comfortably.     Baseline  7/10 L knee pain at worst for the past 3 months (11/29/2018); 4/10 at most for the past 7 days (01/05/2019); 3/10 L knee pain at most for the past 7 days (01/09/2019), (01/26/2019); 1.5/10 L  knee pain at most for the past 10 days (02/21/2019); 3/10 L knee pain at most for the past 2 weeks (05/15/2019)    Time  8    Period  Weeks    Status   Achieved    Target Date  01/26/19      PT LONG TERM GOAL #2   Title  Patient will improve bilateral LE strength by at least 1/2 MMT grade to promote ability to perform functional tasks such as stair negotiation as well as floor to stand transfers.     Time  8    Period  Weeks    Status  Partially Met    Target Date  07/13/19      PT LONG TERM GOAL #3   Title  Patient will be able to perform floor to stand transfer with mod independent to promote ability to get up from the floor if patient falls (pt goal).     Baseline  Pt needs assist to get up from the floor after a fall per subjective reports (11/29/2018). Pt needs assist with floor to stand transfers (01/26/2019)    Time  8    Period  Weeks    Status  Deferred    Target Date  07/13/19      PT LONG TERM GOAL #4   Title  Patient will improve her DGI score to at least 19/24 as a demonstration of decreased fall risk.     Baseline  15/24 (11/29/2018); 18/24 (01/09/2019); deferred secondary to L heel pain (01/26/2019); 19/24 (02/21/2019)    Time  6    Period  Weeks    Status  Achieved    Target Date  03/09/19      PT LONG TERM GOAL #5   Title  Patient will have a decrease in L heel pain to 3/10 or less at worst to promote ability to ambulate more comfortably.     Baseline  at least 7/10 L heel pain at worst (01/26/2019); 4/10 L heel pain at most for the past 7 days (02/21/2019); 8/10 at most for the past 2 weeks (05/15/2019)    Time  8    Period  Weeks    Status  Partially Met    Target Date  07/13/19            Plan - 06/05/19 1504    Clinical Impression Statement  Focused more on manual therapy today to help with pain control of both L knee and L heel. Decreased L knee pain with manual therapy to decrease tension to lumbar paraspinal and quadratus lumborum muscles, gentle L tibial IR for pain control, as well as with STM to L Achilles, heel and plantar fascia tissues to promote mobility. Decreased L knee and heel pain reported by pt  at end of session as she was leaving the clinic. Pt will benefit from continued skilled physical therapy services to decrease L knee and heel pain, improve LE strength, function, and decrease difficulty ambulating.     Personal Factors and Comorbidities  Age;Comorbidity 3+;Time since onset of injury/illness/exacerbation;Fitness    Comorbidities  Osteopenia, chronic venous insufficiency, L knee, and L heel pain    Examination-Activity Limitations  Carry;Lift;Stand;Locomotion Level;Squat;Stairs    Stability/Clinical Decision Making  Evolving/Moderate complexity    Rehab Potential  Good    Clinical Impairments Affecting Rehab Potential  (-) L knee pain, LE weakness, hx of falls; (+) motivated    PT Frequency  2x / week    PT  Duration  8 weeks    PT Treatment/Interventions  Aquatic Therapy;Electrical Stimulation;Iontophoresis 43m/ml Dexamethasone;Gait training;Stair training;Functional mobility training;Therapeutic activities;Therapeutic exercise;Balance training;Neuromuscular re-education;Patient/family education;Manual techniques   electrical stimulation related treatment and ultrasound if appropriate   PT Next Visit Plan  isometric plantar flexion, glute, trunk, scapular strengthening, femoral control, balance training, manual techniques, modalities PRN    Consulted and Agree with Plan of Care  Patient       Patient will benefit from skilled therapeutic intervention in order to improve the following deficits and impairments:  Postural dysfunction, Abnormal gait, Decreased balance, Decreased range of motion, Decreased strength, Improper body mechanics  Visit Diagnosis: Left knee pain, unspecified chronicity  Muscle weakness (generalized)  Pain in left ankle and joints of left foot     Problem List Patient Active Problem List   Diagnosis Date Noted  . Varicose veins of bilateral lower extremities with pain 03/30/2018  . Lymphedema 03/30/2018  . Dizziness 02/02/2017  . Situational  depression 12/18/2016  . Health maintenance examination 10/23/2016  . Vitamin D deficiency 10/11/2016  . Fatigue 08/12/2016  . Essential hypertension 12/17/2015  . Dermatitis of external ear 12/17/2015  . Corn of foot 05/16/2015  . Advanced care planning/counseling discussion 10/30/2014  . Knee pain 08/17/2013  . Oropharyngeal dysphagia 11/15/2012  . Difficulty with family 03/30/2012  . Medicare annual wellness visit, subsequent 03/30/2012  . History of basal cell carcinoma   . Osteoarthritis   . Osteopenia   . Hypothyroidism   . Glaucoma   . Chronic venous insufficiency     MJoneen BoersPT, DPT   06/05/2019, 3:10 PM  [facility] [address] [place] [address]Phone: [phone]  Fax:  [phone] Name: Beth PECKENPAUGHMRN: [id number] of Birth: [birth year]

## 2019-06-09 NOTE — Progress Notes (Signed)
I reviewed health advisor's note, was available for consultation, and agree with documentation and plan.  

## 2019-06-12 ENCOUNTER — Ambulatory Visit: Payer: Medicare Other

## 2019-06-13 ENCOUNTER — Ambulatory Visit (INDEPENDENT_AMBULATORY_CARE_PROVIDER_SITE_OTHER): Payer: Medicare Other | Admitting: Psychology

## 2019-06-13 DIAGNOSIS — F4323 Adjustment disorder with mixed anxiety and depressed mood: Secondary | ICD-10-CM | POA: Diagnosis not present

## 2019-06-27 ENCOUNTER — Ambulatory Visit (INDEPENDENT_AMBULATORY_CARE_PROVIDER_SITE_OTHER): Payer: Medicare Other | Admitting: Psychology

## 2019-06-27 DIAGNOSIS — F4323 Adjustment disorder with mixed anxiety and depressed mood: Secondary | ICD-10-CM | POA: Diagnosis not present

## 2019-07-03 ENCOUNTER — Ambulatory Visit: Payer: Medicare Other

## 2019-07-05 ENCOUNTER — Ambulatory Visit: Payer: Medicare Other

## 2019-07-05 DIAGNOSIS — H401111 Primary open-angle glaucoma, right eye, mild stage: Secondary | ICD-10-CM | POA: Diagnosis not present

## 2019-07-10 ENCOUNTER — Ambulatory Visit: Payer: Medicare Other

## 2019-07-11 ENCOUNTER — Ambulatory Visit (INDEPENDENT_AMBULATORY_CARE_PROVIDER_SITE_OTHER): Payer: Medicare Other | Admitting: Psychology

## 2019-07-11 DIAGNOSIS — F322 Major depressive disorder, single episode, severe without psychotic features: Secondary | ICD-10-CM | POA: Diagnosis not present

## 2019-07-12 ENCOUNTER — Ambulatory Visit: Payer: Medicare Other

## 2019-07-12 DIAGNOSIS — H401111 Primary open-angle glaucoma, right eye, mild stage: Secondary | ICD-10-CM | POA: Diagnosis not present

## 2019-07-18 ENCOUNTER — Telehealth: Payer: Self-pay

## 2019-07-18 NOTE — Telephone Encounter (Signed)
Pt left v/m that she has noticed some weakness in legs and wobbly or unstable on legs at times.pt seen 05/29/19 for annual exam. Pt wants rx for a cane; ins will pay for cane if has rx.Please advise. Pt request cb.

## 2019-07-18 NOTE — Telephone Encounter (Signed)
Rx written and in Lisa's box.  

## 2019-07-19 NOTE — Telephone Encounter (Signed)
Pt aware that Rx is ready.  Requests this be mailed to her home address - verified [place] [id number]  Placed in outgoing mail. Nothing further needed.

## 2019-07-25 ENCOUNTER — Ambulatory Visit (INDEPENDENT_AMBULATORY_CARE_PROVIDER_SITE_OTHER): Payer: Medicare Other | Admitting: Psychology

## 2019-07-25 DIAGNOSIS — F4323 Adjustment disorder with mixed anxiety and depressed mood: Secondary | ICD-10-CM

## 2019-08-08 ENCOUNTER — Ambulatory Visit (INDEPENDENT_AMBULATORY_CARE_PROVIDER_SITE_OTHER): Payer: Medicare Other | Admitting: Psychology

## 2019-08-08 DIAGNOSIS — F4323 Adjustment disorder with mixed anxiety and depressed mood: Secondary | ICD-10-CM

## 2019-08-17 DIAGNOSIS — M25572 Pain in left ankle and joints of left foot: Secondary | ICD-10-CM | POA: Diagnosis not present

## 2019-08-17 DIAGNOSIS — M542 Cervicalgia: Secondary | ICD-10-CM | POA: Diagnosis not present

## 2019-08-17 DIAGNOSIS — M7730 Calcaneal spur, unspecified foot: Secondary | ICD-10-CM | POA: Insufficient documentation

## 2019-08-17 DIAGNOSIS — M7732 Calcaneal spur, left foot: Secondary | ICD-10-CM | POA: Diagnosis not present

## 2019-08-17 DIAGNOSIS — M79672 Pain in left foot: Secondary | ICD-10-CM | POA: Diagnosis not present

## 2019-08-22 ENCOUNTER — Ambulatory Visit (INDEPENDENT_AMBULATORY_CARE_PROVIDER_SITE_OTHER): Payer: Medicare Other | Admitting: Psychology

## 2019-08-22 DIAGNOSIS — F4323 Adjustment disorder with mixed anxiety and depressed mood: Secondary | ICD-10-CM | POA: Diagnosis not present

## 2019-08-25 DIAGNOSIS — M7732 Calcaneal spur, left foot: Secondary | ICD-10-CM | POA: Diagnosis not present

## 2019-08-28 ENCOUNTER — Telehealth: Payer: Self-pay | Admitting: Family Medicine

## 2019-08-28 DIAGNOSIS — M8580 Other specified disorders of bone density and structure, unspecified site: Secondary | ICD-10-CM

## 2019-08-28 DIAGNOSIS — M8589 Other specified disorders of bone density and structure, multiple sites: Secondary | ICD-10-CM

## 2019-08-28 NOTE — Telephone Encounter (Signed)
Patient called to request a Bone Density order be placed for her. Looks like 2016 was her last one ordered and she ws thinking she was due for one. She also wanted you to know that she was seeing Dr Delilah Shan for her back and also heel spurs.

## 2019-08-29 DIAGNOSIS — M47817 Spondylosis without myelopathy or radiculopathy, lumbosacral region: Secondary | ICD-10-CM | POA: Diagnosis not present

## 2019-08-29 DIAGNOSIS — M545 Low back pain, unspecified: Secondary | ICD-10-CM | POA: Insufficient documentation

## 2019-08-29 NOTE — Telephone Encounter (Signed)
dexa ordered

## 2019-09-01 DIAGNOSIS — M7662 Achilles tendinitis, left leg: Secondary | ICD-10-CM | POA: Insufficient documentation

## 2019-09-01 DIAGNOSIS — I890 Lymphedema, not elsewhere classified: Secondary | ICD-10-CM | POA: Diagnosis not present

## 2019-09-05 ENCOUNTER — Ambulatory Visit (INDEPENDENT_AMBULATORY_CARE_PROVIDER_SITE_OTHER): Payer: Medicare Other | Admitting: Psychology

## 2019-09-05 DIAGNOSIS — F4323 Adjustment disorder with mixed anxiety and depressed mood: Secondary | ICD-10-CM | POA: Diagnosis not present

## 2019-09-06 ENCOUNTER — Other Ambulatory Visit: Payer: Medicare Other

## 2019-09-19 ENCOUNTER — Ambulatory Visit (INDEPENDENT_AMBULATORY_CARE_PROVIDER_SITE_OTHER): Payer: Medicare Other | Admitting: Psychology

## 2019-09-19 DIAGNOSIS — F4323 Adjustment disorder with mixed anxiety and depressed mood: Secondary | ICD-10-CM | POA: Diagnosis not present

## 2019-09-22 DIAGNOSIS — Z23 Encounter for immunization: Secondary | ICD-10-CM | POA: Diagnosis not present

## 2019-10-03 ENCOUNTER — Ambulatory Visit (INDEPENDENT_AMBULATORY_CARE_PROVIDER_SITE_OTHER): Payer: Medicare Other | Admitting: Psychology

## 2019-10-03 DIAGNOSIS — F4323 Adjustment disorder with mixed anxiety and depressed mood: Secondary | ICD-10-CM | POA: Diagnosis not present

## 2019-10-16 ENCOUNTER — Ambulatory Visit: Payer: Medicare Other | Admitting: Psychology

## 2019-10-17 ENCOUNTER — Ambulatory Visit (INDEPENDENT_AMBULATORY_CARE_PROVIDER_SITE_OTHER): Payer: Medicare Other | Admitting: Psychology

## 2019-10-17 DIAGNOSIS — F4323 Adjustment disorder with mixed anxiety and depressed mood: Secondary | ICD-10-CM

## 2019-10-30 ENCOUNTER — Ambulatory Visit
Admission: RE | Admit: 2019-10-30 | Discharge: 2019-10-30 | Disposition: A | Discharge status: Home / Self Care | Payer: Medicare Other | Source: Ambulatory Visit | Attending: Family Medicine | Admitting: Family Medicine

## 2019-10-30 DIAGNOSIS — M8589 Other specified disorders of bone density and structure, multiple sites: Secondary | ICD-10-CM | POA: Diagnosis not present

## 2019-10-30 DIAGNOSIS — M85851 Other specified disorders of bone density and structure, right thigh: Secondary | ICD-10-CM | POA: Diagnosis not present

## 2019-10-30 DIAGNOSIS — M8580 Other specified disorders of bone density and structure, unspecified site: Secondary | ICD-10-CM | POA: Insufficient documentation

## 2019-10-31 ENCOUNTER — Ambulatory Visit (INDEPENDENT_AMBULATORY_CARE_PROVIDER_SITE_OTHER): Payer: Medicare Other | Admitting: Psychology

## 2019-10-31 DIAGNOSIS — F4323 Adjustment disorder with mixed anxiety and depressed mood: Secondary | ICD-10-CM

## 2019-11-09 ENCOUNTER — Telehealth: Payer: Self-pay | Admitting: Family Medicine

## 2019-11-09 ENCOUNTER — Other Ambulatory Visit: Payer: Self-pay | Admitting: Family Medicine

## 2019-11-09 NOTE — Telephone Encounter (Signed)
Spoke with pt relaying results and informed pt results were mailed.  Verbalizes understanding.

## 2019-11-09 NOTE — Telephone Encounter (Signed)
Pt calling requesting BD results These are in Epic.   Please advise, thanks.

## 2019-11-14 ENCOUNTER — Ambulatory Visit (INDEPENDENT_AMBULATORY_CARE_PROVIDER_SITE_OTHER): Payer: Medicare Other | Admitting: Psychology

## 2019-11-14 DIAGNOSIS — F4323 Adjustment disorder with mixed anxiety and depressed mood: Secondary | ICD-10-CM

## 2019-11-28 ENCOUNTER — Ambulatory Visit (INDEPENDENT_AMBULATORY_CARE_PROVIDER_SITE_OTHER): Payer: Medicare Other | Admitting: Psychology

## 2019-11-28 DIAGNOSIS — F4323 Adjustment disorder with mixed anxiety and depressed mood: Secondary | ICD-10-CM

## 2019-11-30 ENCOUNTER — Encounter (INDEPENDENT_AMBULATORY_CARE_PROVIDER_SITE_OTHER): Payer: Self-pay | Admitting: Vascular Surgery

## 2019-11-30 ENCOUNTER — Ambulatory Visit (INDEPENDENT_AMBULATORY_CARE_PROVIDER_SITE_OTHER): Payer: Medicare Other | Admitting: Vascular Surgery

## 2019-11-30 ENCOUNTER — Other Ambulatory Visit: Payer: Self-pay

## 2019-11-30 VITALS — BP 159/85 | HR 76 | Resp 16 | Wt 176.6 lb

## 2019-11-30 DIAGNOSIS — I872 Venous insufficiency (chronic) (peripheral): Secondary | ICD-10-CM

## 2019-11-30 DIAGNOSIS — I890 Lymphedema, not elsewhere classified: Secondary | ICD-10-CM | POA: Diagnosis not present

## 2019-11-30 DIAGNOSIS — M8949 Other hypertrophic osteoarthropathy, multiple sites: Secondary | ICD-10-CM

## 2019-11-30 DIAGNOSIS — M150 Primary generalized (osteo)arthritis: Secondary | ICD-10-CM

## 2019-11-30 DIAGNOSIS — I10 Essential (primary) hypertension: Secondary | ICD-10-CM | POA: Diagnosis not present

## 2019-11-30 DIAGNOSIS — M159 Polyosteoarthritis, unspecified: Secondary | ICD-10-CM

## 2019-11-30 NOTE — Progress Notes (Signed)
MRN : [id number]  Beth Rodgers is a 84 y.o. (1935-12-20) female who presents with chief complaint of  Chief Complaint  Patient presents with   Follow-up    1year follow up  .  History of Present Illness:   The patient returns to the office for followup evaluation regarding leg swelling.  The swelling has persisted but with the lymph pump is much, much better controlled. The pain associated with swelling is essentially eliminated. There have not been any interval development of a ulcerations or wounds.  She did have an episode of cellulitis which has now been resolved.  She did go to [place] for several months before the [place] and did well with compression.  The patient is in the process of getting a refresher on the pump and has not been using it as much.  Since the previous visit the patient has been wearing graduated compression stockings and using the lymph pump on a routine basis and  has noted good control of  the lymphedema.   Current Meds  Medication Sig   Ascorbic Acid (VITAMIN C CR PO) Take by mouth as directed. Reported on 12/17/2015   augmented betamethasone dipropionate (DIPROLENE-AF) 0.05 % cream 2 (two) times daily as needed. Appy to active flares on body twice a day as needed   CALCIUM CARBONATE PO Take 600 mg by mouth as directed. Reported on 12/17/2015   Cholecalciferol (VITAMIN D3) 1.25 MG (50000 UT) TABS Take 1 tablet by mouth once a week.   CICLOPIROX EX Apply topically as needed. Reported on 12/17/2015   desonide (DESOWEN) 0.05 % cream APPLY TO AFFECTED AREA TWICE A DAY (Patient taking differently: as needed. )   diclofenac sodium (VOLTAREN) 1 % GEL Apply 1 application topically 3 (three) times daily.   halobetasol (ULTRAVATE) 0.05 % ointment APPLY TO AFFECTED AREA TWICE A DAY   levothyroxine (SYNTHROID) 75 MCG tablet Take 1 tablet (75 mcg total) by mouth daily.   losartan (COZAAR) 100 MG tablet Take 1 tablet (100 mg total) by mouth  daily.   Multiple Vitamin (MULTIVITAMIN) tablet Take 1 tablet by mouth as needed. Reported on 12/17/2015   Netarsudil-Latanoprost (ROCKLATAN) 0.02-0.005 % SOLN Rocklatan 0.02 %-0.005 % eye drops  APPLY 1 DROP(S) IN BOTH EYES ONCE IN THE EVENING FOR 30 DAYS   NITROSTAT 0.4 MG SL tablet every 5 (five) minutes as needed. Reported on 04/16/2016    Past Medical History:  Diagnosis Date   Cellulitis 08/10/2018   Chronic venous insufficiency    with varicose veins   Glaucoma    History  of basal cell carcinoma    left nare   History of chicken pox    Hypothyroid    Osteoarthritis    back pain, L knee pain   Osteopenia 06/2009   DEXA 11/2015: T -2.3 hip, -2.2 spine, on longterm bisphosphonate/evista   Pyogenic granuloma 04/16/2016    Past Surgical History:  Procedure Laterality Date   BREAST BIOPSY     core   CATARACT EXTRACTION     bilateral   DEXA  06/2009   T score Spine: -1.8, hip -2.0   Foam sclerotherapy Bilateral 09/2015   [place] [place], [place]   NASAL RECONSTRUCTION  2005   for [facility] L nare s/p Mohs   nuclear stress test  2014   normal in [place] Left 01/2012   remnant L G saphenous vein below knee   RADIOFREQUENCY ABLATION Right 02/2013   RLE vein ablation  VEIN LIGATION AND STRIPPING  remote   bilateral G saphenous veins    Social History Social History   Tobacco Use   Smoking status: Never Smoker   Smokeless tobacco: Never Used  Substance Use Topics   Alcohol use: Yes    Comment: Occasionally drinks wine   Drug use: No    Family History Family History  Problem Relation Age of Onset   Cancer Mother        thyroid cancer   Heart disease Father        CHF   Diabetes Father    Glaucoma Father    Arthritis Father    Coronary artery disease Maternal Uncle    Bipolar disorder Son    Stroke Neg Hx    Breast cancer Neg Hx     No Known Allergies   REVIEW OF SYSTEMS (Negative unless  checked)  Constitutional: [] Weight loss  [] Fever  [] Chills Cardiac: [] Chest pain   [] Chest pressure   [] Palpitations   [] Shortness of breath when laying flat   [] Shortness of breath with exertion. Vascular:  [] Pain in legs with walking   [] Pain in legs at rest  [] History of DVT   [] Phlebitis   [x] Swelling in legs   [x] Varicose veins   [] Non-healing ulcers Pulmonary:   [] Uses home oxygen   [] Productive cough   [] Hemoptysis   [] Wheeze  [] COPD   [] Asthma Neurologic:  [] Dizziness   [] Seizures   [] History of stroke   [] History of TIA  [] Aphasia   [] Vissual changes   [] Weakness or numbness in arm   [] Weakness or numbness in leg Musculoskeletal:   [] Joint swelling   [x] Joint pain   [] Low back pain Hematologic:  [] Easy bruising  [] Easy bleeding   [] Hypercoagulable state   [] Anemic Gastrointestinal:  [] Diarrhea   [] Vomiting  [] Gastroesophageal reflux/heartburn   [] Difficulty swallowing. Genitourinary:  [] Chronic kidney disease   [] Difficult urination  [] Frequent urination   [] Blood in urine Skin:  [x] Rashes   [] Ulcers  Psychological:  [] History of anxiety   []  History of major depression.  Physical Examination  Vitals:   11/30/19 1057  BP: (!) 159/85  Pulse: 76  Resp: 16  Weight: 176 lb 9.6 oz (80.1 kg)   Body mass index is 29.39 kg/m. Gen: WD/WN, NAD Head: [place]/AT, No temporalis wasting.  Ear/Nose/Throat: Hearing grossly intact, nares w/o erythema or drainage Eyes: PER, EOMI, sclera nonicteric.  Neck: Supple, no large masses.   Pulmonary:  Good air movement, no audible wheezing bilaterally, no use of accessory muscles.  Cardiac: RRR, no JVD Vascular: scattered varicosities present bilaterally.  Severe venous stasis changes to the legs bilaterally.  3+ soft pitting edema Gastrointestinal: Non-distended. No guarding/no peritoneal signs.  Musculoskeletal: M/S 5/5 throughout.  No deformity or atrophy.  Neurologic: CN 2-12 intact. Symmetrical.  Speech is fluent. Motor exam as listed  above. Psychiatric: Judgment intact, Mood & affect appropriate for pt's clinical situation. Dermatologic: Severe venous rashes no ulcers noted.  No changes consistent with cellulitis. Lymph : No lichenification or skin changes of chronic lymphedema.  CBC Lab Results  Component Value Date   WBC 5.8 03/14/2019   HGB 13.7 03/14/2019   HCT 41.3 03/14/2019   MCV 86.6 03/14/2019   PLT 206.0 03/14/2019    BMET    Component Value Date/Time   NA 141 03/14/2019 1125   K 4.2 03/14/2019 1125   CL 105 03/14/2019 1125   CO2 30 03/14/2019 1125   GLUCOSE 89 03/14/2019 1125   BUN 18  03/14/2019 1125   CREATININE 0.67 03/14/2019 1125   CALCIUM 9.2 03/14/2019 1125   CrCl cannot be calculated (Patient's most recent lab result is older than the maximum 21 days allowed.).  COAG No results found for: INR, PROTIME  Radiology No results found.  Assessment/Plan 1. Chronic venous insufficiency No surgery or intervention at this point in time.    I have reviewed my discussion with the patient regarding lymphedema and why it  causes symptoms.  Patient will continue wearing graduated compression stockings class 1 (20-30 mmHg) on a daily basis a prescription was given. The patient is reminded to put the stockings on first thing in the morning and removing them in the evening. The patient is instructed specifically not to sleep in the stockings.   In addition, behavioral modification throughout the day will be continued.  This will include frequent elevation (such as in a recliner), use of over the counter pain medications as needed and exercise such as walking.  I have reviewed systemic causes for chronic edema such as liver, kidney and cardiac etiologies and there does not appear to be any significant changes in these organ systems over the past year.  The patient is under the impression that these organ systems are all stable and unchanged.    The patient will continue aggressive use of the  lymph  pump.  This will continue to improve the edema control and prevent sequela such as ulcers and infections.   The patient will follow-up with me on an annual basis.   2. Lymphedema No surgery or intervention at this point in time.    I have reviewed my discussion with the patient regarding lymphedema and why it  causes symptoms.  Patient will continue wearing graduated compression stockings class 1 (20-30 mmHg) on a daily basis a prescription was given. The patient is reminded to put the stockings on first thing in the morning and removing them in the evening. The patient is instructed specifically not to sleep in the stockings.   In addition, behavioral modification throughout the day will be continued.  This will include frequent elevation (such as in a recliner), use of over the counter pain medications as needed and exercise such as walking.  I have reviewed systemic causes for chronic edema such as liver, kidney and cardiac etiologies and there does not appear to be any significant changes in these organ systems over the past year.  The patient is under the impression that these organ systems are all stable and unchanged.    The patient will continue aggressive use of the  lymph pump.  This will continue to improve the edema control and prevent sequela such as ulcers and infections.   The patient will follow-up with me on an annual basis.   3. Essential hypertension Continue antihypertensive medications as already ordered, these medications have been reviewed and there are no changes at this time.   4. Primary osteoarthritis involving multiple joints Continue NSAID medications as already ordered, these medications have been reviewed and there are no changes at this time.  Continued activity and therapy was stressed.     Hortencia Pilar, MD  11/30/2019 12:52 PM

## 2019-12-12 ENCOUNTER — Ambulatory Visit (INDEPENDENT_AMBULATORY_CARE_PROVIDER_SITE_OTHER): Payer: Medicare Other | Admitting: Psychology

## 2019-12-12 DIAGNOSIS — F4323 Adjustment disorder with mixed anxiety and depressed mood: Secondary | ICD-10-CM | POA: Diagnosis not present

## 2019-12-26 ENCOUNTER — Ambulatory Visit (INDEPENDENT_AMBULATORY_CARE_PROVIDER_SITE_OTHER): Payer: Medicare Other | Admitting: Psychology

## 2019-12-26 DIAGNOSIS — F4323 Adjustment disorder with mixed anxiety and depressed mood: Secondary | ICD-10-CM | POA: Diagnosis not present

## 2020-01-05 ENCOUNTER — Other Ambulatory Visit: Payer: Self-pay | Admitting: Family Medicine

## 2020-01-09 ENCOUNTER — Ambulatory Visit (INDEPENDENT_AMBULATORY_CARE_PROVIDER_SITE_OTHER): Payer: Medicare Other | Admitting: Psychology

## 2020-01-09 DIAGNOSIS — F4323 Adjustment disorder with mixed anxiety and depressed mood: Secondary | ICD-10-CM | POA: Diagnosis not present

## 2020-01-17 DIAGNOSIS — H401111 Primary open-angle glaucoma, right eye, mild stage: Secondary | ICD-10-CM | POA: Diagnosis not present

## 2020-01-23 ENCOUNTER — Ambulatory Visit (INDEPENDENT_AMBULATORY_CARE_PROVIDER_SITE_OTHER): Payer: Medicare Other | Admitting: Psychology

## 2020-01-23 DIAGNOSIS — F4323 Adjustment disorder with mixed anxiety and depressed mood: Secondary | ICD-10-CM | POA: Diagnosis not present

## 2020-01-28 DIAGNOSIS — Z23 Encounter for immunization: Secondary | ICD-10-CM | POA: Diagnosis not present

## 2020-01-29 ENCOUNTER — Telehealth: Payer: Self-pay | Admitting: Family Medicine

## 2020-01-29 DIAGNOSIS — H9193 Unspecified hearing loss, bilateral: Secondary | ICD-10-CM

## 2020-01-29 NOTE — Telephone Encounter (Signed)
Patient left message on my machine requesting an Audiology Referral as she thinks she is losing some hearing. Please place Referral .

## 2020-01-29 NOTE — Telephone Encounter (Signed)
Referral placed.

## 2020-02-06 ENCOUNTER — Ambulatory Visit (INDEPENDENT_AMBULATORY_CARE_PROVIDER_SITE_OTHER): Payer: Medicare Other | Admitting: Psychology

## 2020-02-06 DIAGNOSIS — F4323 Adjustment disorder with mixed anxiety and depressed mood: Secondary | ICD-10-CM

## 2020-02-07 ENCOUNTER — Telehealth: Payer: Self-pay

## 2020-02-07 DIAGNOSIS — R5383 Other fatigue: Secondary | ICD-10-CM

## 2020-02-07 DIAGNOSIS — I10 Essential (primary) hypertension: Secondary | ICD-10-CM

## 2020-02-07 DIAGNOSIS — E559 Vitamin D deficiency, unspecified: Secondary | ICD-10-CM

## 2020-02-07 DIAGNOSIS — E039 Hypothyroidism, unspecified: Secondary | ICD-10-CM

## 2020-02-07 NOTE — Telephone Encounter (Signed)
Pt left v/m that she was seen for annual 05/29/2019 and pt has FU appt with Dr Darnell Level 03/06/20. Pt having sinus issues and has scheduled an appt with ENT on 02/28/20. Pt is also concerned about having weakness and needing more rest. Pt has a friend that takes Vit B 12 shots and pt wants to know if Dr Darnell Level would consider this for pt so they can discuss at 03/06/20 appt.  I spoke with pt; no fever or chills, pt said she does not have covid symptoms and had first covid vaccine on 01/28/20 and has appt scheduled on 02/20/20 for 2nd covid vaccine. Pt just wants Dr Darnell Level to Think about if pt could have Vit B 12 shots and talk with her about that at the 03/06/20 visit. Pt also wants to know since last labs were done 03/14/19 if pt should get labs prior to or at the 03/06/20 appt. Pt request cb.

## 2020-02-07 NOTE — Telephone Encounter (Signed)
Spoke with Beth Rodgers relaying Dr. Synthia Innocent message.  Verbalizes understanding.  Says ENT has changed her eye drops so she wants to see if their treatments may help with tiredness.  She will call back to let us know and schedule lab visit for vit B12 accordingly.

## 2020-02-07 NOTE — Telephone Encounter (Signed)
We will review at [place]. Please schedule labs prior (ordered).  Would want to check vitamin B12 levels prior to starting b12 shots as I'd only recommend if low - but medicare may not cover b12 due to just fatigue. If she's willing to possibly pay out of pocket, we can check b12 (ordered).

## 2020-02-14 ENCOUNTER — Other Ambulatory Visit: Payer: Self-pay | Admitting: Family Medicine

## 2020-02-16 ENCOUNTER — Encounter: Payer: Self-pay | Admitting: Family Medicine

## 2020-02-16 ENCOUNTER — Other Ambulatory Visit: Payer: Self-pay

## 2020-02-16 ENCOUNTER — Ambulatory Visit: Payer: Medicare Other | Admitting: Family Medicine

## 2020-02-16 ENCOUNTER — Ambulatory Visit (INDEPENDENT_AMBULATORY_CARE_PROVIDER_SITE_OTHER): Payer: Medicare Other | Admitting: Family Medicine

## 2020-02-16 VITALS — BP 124/66 | HR 76 | Temp 97.9°F | Ht 65.0 in | Wt 180.2 lb

## 2020-02-16 DIAGNOSIS — E559 Vitamin D deficiency, unspecified: Secondary | ICD-10-CM | POA: Diagnosis not present

## 2020-02-16 DIAGNOSIS — Z639 Problem related to primary support group, unspecified: Secondary | ICD-10-CM

## 2020-02-16 DIAGNOSIS — E039 Hypothyroidism, unspecified: Secondary | ICD-10-CM

## 2020-02-16 DIAGNOSIS — R42 Dizziness and giddiness: Secondary | ICD-10-CM

## 2020-02-16 DIAGNOSIS — I10 Essential (primary) hypertension: Secondary | ICD-10-CM | POA: Diagnosis not present

## 2020-02-16 DIAGNOSIS — R5383 Other fatigue: Secondary | ICD-10-CM | POA: Diagnosis not present

## 2020-02-16 LAB — LIPID PANEL
Cholesterol: 193 mg/dL (ref 0–200)
HDL: 77.3 mg/dL (ref >39.00)
LDL Cholesterol: 104 mg/dL — ABNORMAL HIGH (ref 0–99)
NonHDL: 115.61
Total CHOL/HDL Ratio: 2
Triglycerides: 59 mg/dL (ref 0.0–149.0)
VLDL: 11.8 mg/dL (ref 0.0–40.0)

## 2020-02-16 LAB — COMPREHENSIVE METABOLIC PANEL
ALT: 18 U/L (ref 0–35)
AST: 18 U/L (ref 0–37)
Albumin: 4.1 g/dL (ref 3.5–5.2)
Alkaline Phosphatase: 87 U/L (ref 39–117)
BUN: 19 mg/dL (ref 6–23)
CO2: 29 mEq/L (ref 19–32)
Calcium: 9 mg/dL (ref 8.4–10.5)
Chloride: 104 mEq/L (ref 96–112)
Creatinine, Ser: 0.73 mg/dL (ref 0.40–1.20)
GFR: 76.18 mL/min (ref >60.00)
Glucose, Bld: 82 mg/dL (ref 70–99)
Potassium: 4.5 mEq/L (ref 3.5–5.1)
Sodium: 140 mEq/L (ref 135–145)
Total Bilirubin: 0.6 mg/dL (ref 0.2–1.2)
Total Protein: 6.7 g/dL (ref 6.0–8.3)

## 2020-02-16 LAB — CBC WITH DIFFERENTIAL/PLATELET
Basophils Absolute: 0 10*3/uL (ref 0.0–0.1)
Basophils Relative: 0.3 % (ref 0.0–3.0)
Eosinophils Absolute: 0.1 10*3/uL (ref 0.0–0.7)
Eosinophils Relative: 1.7 % (ref 0.0–5.0)
HCT: 41 % (ref 36.0–46.0)
Hemoglobin: 13.5 g/dL (ref 12.0–15.0)
Lymphocytes Relative: 18.7 % (ref 12.0–46.0)
Lymphs Abs: 1.3 10*3/uL (ref 0.7–4.0)
MCHC: 33 g/dL (ref 30.0–36.0)
MCV: 87.5 fl (ref 78.0–100.0)
Monocytes Absolute: 0.9 10*3/uL (ref 0.1–1.0)
Monocytes Relative: 12.9 % — ABNORMAL HIGH (ref 3.0–12.0)
Neutro Abs: 4.8 10*3/uL (ref 1.4–7.7)
Neutrophils Relative %: 66.4 % (ref 43.0–77.0)
Platelets: 220 10*3/uL (ref 150.0–400.0)
RBC: 4.68 Mil/uL (ref 3.87–5.11)
RDW: 14.4 % (ref 11.5–15.5)
WBC: 7.2 10*3/uL (ref 4.0–10.5)

## 2020-02-16 LAB — T4, FREE: Free T4: 1.07 ng/dL (ref 0.60–1.60)

## 2020-02-16 LAB — VITAMIN D 25 HYDROXY (VIT D DEFICIENCY, FRACTURES): VITD: 50.58 ng/mL (ref 30.00–100.00)

## 2020-02-16 LAB — VITAMIN B12: Vitamin B-12: 682 pg/mL (ref 211–911)

## 2020-02-16 LAB — TSH: TSH: 4.25 u[IU]/mL (ref 0.35–4.50)

## 2020-02-16 MED ORDER — VITAMIN D 50 MCG (2000 UT) PO CAPS: 1.0000 | ORAL_CAPSULE | Freq: Every day | ORAL | Status: DC

## 2020-02-16 NOTE — Assessment & Plan Note (Signed)
Ongoing struggle with family relationship and concerns. Support provided. She does not desire any habit forming medications, declines benzo. Discussed stress relieving strategies.

## 2020-02-16 NOTE — Assessment & Plan Note (Signed)
Ongoing, worsened recently  Check for reversible causes. Check orthostatic vital signs today

## 2020-02-16 NOTE — Assessment & Plan Note (Signed)
Now on vit D 2000 IU daily but only remembers to take 4/7 days/wk.  Will check levels.

## 2020-02-16 NOTE — Assessment & Plan Note (Signed)
Update labs.  

## 2020-02-16 NOTE — Progress Notes (Signed)
This visit was conducted in person.  BP 124/66 (BP Location: Left Arm, Patient Position: Sitting, Cuff Size: Normal)   Pulse 76   Temp 97.9 F (36.6 C) (Temporal)   Ht 5\' 5"  (1.651 m)   Wt 180 lb 3 oz (81.7 kg)   SpO2 97%   BMI 29.98 kg/m    Orthostatic VS for the past 24 hrs (Last 3 readings):  BP- Lying BP- Standing at 0 minutes  02/16/20 1116 -- 126/82  02/16/20 1113 132/80 --    CC: fatigue Subjective:    Patient ID: Beth Rodgers, female    DOB: 1935-05-05, 85 y.o.   MRN: [id number]  HPI: Beth Rodgers is a 85 y.o. female presenting on 02/16/2020 for Fatigue (C/o feeling tired, naps several times a day and feels weak sometimes. Started about 2 mos ago. )   2 mo h/o noticeable increase in fatigue. Notes some unsteadiness when first standing. Increasing need for naps - up to 3 a day. Also sleeps well at night.  Weight gain noted - 17 lbs in the past year. Mild constipation managed with prunes.   No vertigo, syncope, weight loss. No fevers/chills, night sweats. No palpitations, dyspnea.  No heat or cold intolerance, diarrhea.   She was previously on vit D weekly replacement, now has started daily vit D 2000iu daily.   She already received her first covid vaccine.  Continues seeing Tourist information centre manager. Grandson s/p brain tumor surgery, not doing well.      Relevant past medical, surgical, family and social history reviewed and updated as indicated. Interim medical history since our last visit reviewed. Allergies and medications reviewed and updated. Outpatient Medications Prior to Visit  Medication Sig Dispense Refill  . Ascorbic Acid (VITAMIN C CR PO) Take by mouth as directed. Reported on 12/17/2015    . augmented betamethasone dipropionate (DIPROLENE-AF) 0.05 % cream 2 (two) times daily as needed. Appy to active flares on body twice a day as needed  1  . CALCIUM CARBONATE PO Take 600 mg by mouth as directed. Reported on 12/17/2015    . CICLOPIROX EX Apply topically as  needed. Reported on 12/17/2015    . desonide (DESOWEN) 0.05 % cream APPLY TO AFFECTED AREA TWICE A DAY (Patient taking differently: as needed. ) 30 g 1  . diclofenac sodium (VOLTAREN) 1 % GEL Apply 1 application topically 3 (three) times daily. 1 Tube 0  . halobetasol (ULTRAVATE) 0.05 % ointment APPLY TO AFFECTED AREA TWICE A DAY  1  . Latanoprostene Bunod (VYZULTA OP) Place 1 drop into both eyes daily.    Marland Kitchen levothyroxine (SYNTHROID) 75 MCG tablet TAKE 1 TABLET BY MOUTH EVERY DAY 90 tablet 0  . losartan (COZAAR) 100 MG tablet TAKE 1 TABLET BY MOUTH EVERY DAY 90 tablet 1  . Multiple Vitamin (MULTIVITAMIN) tablet Take 1 tablet by mouth as needed. Reported on 12/17/2015    . NITROSTAT 0.4 MG SL tablet every 5 (five) minutes as needed. Reported on 04/16/2016    . Cholecalciferol (VITAMIN D3) 1.25 MG (50000 UT) TABS Take 1 tablet by mouth once a week. 12 tablet 1  . Netarsudil-Latanoprost (ROCKLATAN) 0.02-0.005 % SOLN Rocklatan 0.02 %-0.005 % eye drops  APPLY 1 DROP(S) IN BOTH EYES ONCE IN THE EVENING FOR 30 DAYS     No facility-administered medications prior to visit.     Per HPI unless specifically indicated in ROS section below Review of Systems Objective:    BP 124/66 (BP Location: Left Arm,  Patient Position: Sitting, Cuff Size: Normal)   Pulse 76   Temp 97.9 F (36.6 C) (Temporal)   Ht 5\' 5"  (1.651 m)   Wt 180 lb 3 oz (81.7 kg)   SpO2 97%   BMI 29.98 kg/m   Wt Readings from Last 3 Encounters:  02/16/20 180 lb 3 oz (81.7 kg)  11/30/19 176 lb 9.6 oz (80.1 kg)  05/29/19 169 lb (76.7 kg)    Physical Exam Vitals and nursing note reviewed.  Constitutional:      Appearance: Normal appearance. She is not ill-appearing.  HENT:     Head: Normocephalic and atraumatic.  Eyes:     Extraocular Movements: Extraocular movements intact.     Conjunctiva/sclera: Conjunctivae normal.     Pupils: Pupils are equal, round, and reactive to light.  Neck:     Thyroid: No thyromegaly or thyroid  tenderness.  Cardiovascular:     Rate and Rhythm: Normal rate and regular rhythm.     Pulses: Normal pulses.     Heart sounds: Normal heart sounds. No murmur.  Pulmonary:     Effort: Pulmonary effort is normal. No respiratory distress.     Breath sounds: Normal breath sounds. No wheezing, rhonchi or rales.  Musculoskeletal:     Cervical back: Normal range of motion and neck supple.     Right lower leg: No edema.     Left lower leg: No edema.  Skin:    General: Skin is warm and dry.     Findings: No rash.  Neurological:     General: No focal deficit present.     Mental Status: She is alert.  Psychiatric:        Mood and Affect: Mood normal.       Results for orders placed or performed in visit on 03/14/19  VITAMIN D 25 Hydroxy (Vit-D Deficiency, Fractures)  Result Value Ref Range   VITD 21.43 (L) 30.00 - 100.00 ng/mL  Basic metabolic panel  Result Value Ref Range   Sodium 141 135 - 145 mEq/L   Potassium 4.2 3.5 - 5.1 mEq/L   Chloride 105 96 - 112 mEq/L   CO2 30 19 - 32 mEq/L   Glucose, Bld 89 70 - 99 mg/dL   BUN 18 6 - 23 mg/dL   Creatinine, Ser 0.67 0.40 - 1.20 mg/dL   Calcium 9.2 8.4 - 10.5 mg/dL   GFR 84.30 >60.00 mL/min  CBC with Differential/Platelet  Result Value Ref Range   WBC 5.8 4.0 - 10.5 K/uL   RBC 4.76 3.87 - 5.11 Mil/uL   Hemoglobin 13.7 12.0 - 15.0 g/dL   HCT 41.3 36.0 - 46.0 %   MCV 86.6 78.0 - 100.0 fl   MCHC 33.3 30.0 - 36.0 g/dL   RDW 14.0 11.5 - 15.5 %   Platelets 206.0 150.0 - 400.0 K/uL   Neutrophils Relative % 61.2 43.0 - 77.0 %   Lymphocytes Relative 22.1 12.0 - 46.0 %   Monocytes Relative 13.1 (H) 3.0 - 12.0 %   Eosinophils Relative 3.3 0.0 - 5.0 %   Basophils Relative 0.3 0.0 - 3.0 %   Neutro Abs 3.5 1.4 - 7.7 K/uL   Lymphs Abs 1.3 0.7 - 4.0 K/uL   Monocytes Absolute 0.8 0.1 - 1.0 K/uL   Eosinophils Absolute 0.2 0.0 - 0.7 K/uL   Basophils Absolute 0.0 0.0 - 0.1 K/uL  TSH  Result Value Ref Range   TSH 4.38 0.35 - 4.50 uIU/mL  Lipid  panel  Result Value Ref Range   Cholesterol 195 0 - 200 mg/dL   Triglycerides 70.0 0.0 - 149.0 mg/dL   HDL 72.40 >39.00 mg/dL   VLDL 14.0 0.0 - 40.0 mg/dL   LDL Cholesterol 109 (H) 0 - 99 mg/dL   Total CHOL/HDL Ratio 3    NonHDL 122.78    Assessment & Plan:  This visit occurred during the SARS-CoV-2 public health emergency.  Safety protocols were in place, including screening questions prior to the visit, additional usage of staff PPE, and extensive cleaning of exam room while observing appropriate contact time as indicated for disinfecting solutions.   Problem List Items Addressed This Visit    Vitamin D deficiency    Now on vit D 2000 IU daily but only remembers to take 4/7 days/wk.  Will check levels.      Hypothyroidism    Update labs.       Fatigue - Primary    Ongoing, worsened recently  Check for reversible causes. Check orthostatic vital signs today       Essential hypertension    Check orthostatic vital signs today.       Dizziness    Check orthostatic vital signs today. No vertigo or syncope.       Difficulty with family    Ongoing struggle with family relationship and concerns. Support provided. She does not desire any habit forming medications, declines benzo. Discussed stress relieving strategies.           Meds ordered this encounter  Medications  . Cholecalciferol (VITAMIN D) 50 MCG (2000 UT) CAPS    Sig: Take 1 capsule (2,000 Units total) by mouth daily.    Dispense:  30 capsule   No orders of the defined types were placed in this encounter.   Patient Instructions  [employer] eye goggles Labs today.  Gusto verla hoy - le contactaremos con los resultados cuando regresen.    Follow up plan: Return if symptoms worsen or fail to improve.  Ria Bush, MD

## 2020-02-16 NOTE — Assessment & Plan Note (Signed)
Check orthostatic vital signs today. No vertigo or syncope.

## 2020-02-16 NOTE — Assessment & Plan Note (Signed)
Check orthostatic vital signs today.

## 2020-02-16 NOTE — Patient Instructions (Addendum)
[employer] eye goggles Labs today.  Gusto verla hoy - le contactaremos con los resultados cuando regresen.

## 2020-02-20 ENCOUNTER — Ambulatory Visit (INDEPENDENT_AMBULATORY_CARE_PROVIDER_SITE_OTHER): Payer: Medicare Other | Admitting: Psychology

## 2020-02-20 DIAGNOSIS — F4323 Adjustment disorder with mixed anxiety and depressed mood: Secondary | ICD-10-CM

## 2020-02-20 DIAGNOSIS — Z23 Encounter for immunization: Secondary | ICD-10-CM | POA: Diagnosis not present

## 2020-02-27 DIAGNOSIS — H6121 Impacted cerumen, right ear: Secondary | ICD-10-CM | POA: Diagnosis not present

## 2020-02-27 DIAGNOSIS — H903 Sensorineural hearing loss, bilateral: Secondary | ICD-10-CM | POA: Diagnosis not present

## 2020-03-05 ENCOUNTER — Ambulatory Visit (INDEPENDENT_AMBULATORY_CARE_PROVIDER_SITE_OTHER): Payer: Medicare Other | Admitting: Psychology

## 2020-03-05 DIAGNOSIS — F4323 Adjustment disorder with mixed anxiety and depressed mood: Secondary | ICD-10-CM

## 2020-03-06 ENCOUNTER — Ambulatory Visit: Payer: Medicare Other | Admitting: Family Medicine

## 2020-03-07 ENCOUNTER — Other Ambulatory Visit: Payer: Self-pay

## 2020-03-07 MED ORDER — LEVOTHYROXINE SODIUM 75 MCG PO TABS: 75.0000 ug | ORAL_TABLET | Freq: Every day | ORAL | 1 refills | Status: DC

## 2020-03-07 NOTE — Telephone Encounter (Signed)
Pt left v/m that she had to cancel 03/06/20 FU hypertension appt. Pt requesting refill synthroid to [facility]. Pt has 3 more days of med. Synthroid 75 mcg last filled # 90 on 01/08/20 and per med list pt should be taking one tab per day and pt should have med to last until 04/07/20. Pt last seen and had TSH on 02/16/20. Next appt scheduled is annual exam on 06/05/2020. Please advise.

## 2020-03-14 DIAGNOSIS — H401131 Primary open-angle glaucoma, bilateral, mild stage: Secondary | ICD-10-CM | POA: Diagnosis not present

## 2020-03-15 ENCOUNTER — Other Ambulatory Visit: Payer: Self-pay

## 2020-03-15 ENCOUNTER — Encounter: Payer: Self-pay | Admitting: Family Medicine

## 2020-03-15 ENCOUNTER — Ambulatory Visit (INDEPENDENT_AMBULATORY_CARE_PROVIDER_SITE_OTHER): Payer: Medicare Other | Admitting: Family Medicine

## 2020-03-15 VITALS — BP 102/63 | HR 83 | Temp 96.7°F | Ht 65.0 in | Wt 176.9 lb

## 2020-03-15 DIAGNOSIS — M542 Cervicalgia: Secondary | ICD-10-CM

## 2020-03-15 DIAGNOSIS — I10 Essential (primary) hypertension: Secondary | ICD-10-CM | POA: Diagnosis not present

## 2020-03-15 NOTE — Assessment & Plan Note (Signed)
Home wrist cuff running too low - she will start using arm cuff she has at home. Continue losartan 100mg  daily.

## 2020-03-15 NOTE — Patient Instructions (Signed)
I think you have muscular strain of right side of neck. Do gentle exercises daily (provided today)  Continue tylenol and heating pad.  Blood pressures are doing well.

## 2020-03-15 NOTE — Assessment & Plan Note (Signed)
Exam consistent with cervical neck strain. Supportive care reviewed. Provided with stretching exercises from [facility] pt advisor. Continue tylenol, heat. Pt declines muscle relaxant. Update if not improving with treatment.

## 2020-03-15 NOTE — Progress Notes (Addendum)
This visit was conducted in person.  BP 102/63 (BP Location: Left Wrist, Patient Position: Sitting, Cuff Size: Normal) Comment: patient machine  Pulse 83   Temp (!) 96.7 F (35.9 C) (Temporal)   Ht 5\' 5"  (1.651 m)   Wt 176 lb 14.4 oz (80.2 kg)   SpO2 98%   BMI 29.44 kg/m   On our cuff 124/88  CC: HTN f/u visit  Subjective:    Patient ID: Beth Rodgers, female    DOB: 03/10/1935, 85 y.o.   MRN: [id number]  HPI: Beth Rodgers is a 85 y.o. female presenting on 03/15/2020 for Hypertension (Pt has not been checking her BP)   See prior note for details.  Ongoing fatigue - last month's workup largely reassuring. This is improving.   HTN - compliant with current antihypertensive regimen of losartan 100mg  daily. Does check blood pressures at home: well controlled. No low blood pressure readings or symptoms of dizziness/syncope.  Denies HA, vision changes, CP/tightness, SOB, leg swelling.    Notes some stiffness to R neck worse as the day progresses, present for about a month. Managing with heating pad, tried some tylenol. No shooting pain down arms or numbness/weakness. She does do regular exercises at home.   Saw ENT Edison Pace) due to decreased hearing.      Relevant past medical, surgical, family and social history reviewed and updated as indicated. Interim medical history since our last visit reviewed. Allergies and medications reviewed and updated. Outpatient Medications Prior to Visit  Medication Sig Dispense Refill  . Ascorbic Acid (VITAMIN C CR PO) Take by mouth as directed. Reported on 12/17/2015    . augmented betamethasone dipropionate (DIPROLENE-AF) 0.05 % cream 2 (two) times daily as needed. Appy to active flares on body twice a day as needed  1  . CALCIUM CARBONATE PO Take 600 mg by mouth as directed. Reported on 12/17/2015    . Cholecalciferol (VITAMIN D) 50 MCG (2000 UT) CAPS Take 1 capsule (2,000 Units total) by mouth daily. 30 capsule   . CICLOPIROX EX Apply topically as  needed. Reported on 12/17/2015    . desonide (DESOWEN) 0.05 % cream APPLY TO AFFECTED AREA TWICE A DAY (Patient taking differently: as needed. ) 30 g 1  . diclofenac sodium (VOLTAREN) 1 % GEL Apply 1 application topically 3 (three) times daily. 1 Tube 0  . halobetasol (ULTRAVATE) 0.05 % ointment APPLY TO AFFECTED AREA TWICE A DAY  1  . levothyroxine (SYNTHROID) 75 MCG tablet Take 1 tablet (75 mcg total) by mouth daily. 90 tablet 1  . losartan (COZAAR) 100 MG tablet TAKE 1 TABLET BY MOUTH EVERY DAY 90 tablet 1  . Multiple Vitamin (MULTIVITAMIN) tablet Take 1 tablet by mouth as needed. Reported on 12/17/2015    . NITROSTAT 0.4 MG SL tablet every 5 (five) minutes as needed. Reported on 04/16/2016    . Travoprost, BAK Free, (TRAVATAN) 0.004 % SOLN ophthalmic solution Place 1 drop into both eyes at bedtime.    . Latanoprostene Bunod (VYZULTA OP) Place 1 drop into both eyes daily.     No facility-administered medications prior to visit.     Per HPI unless specifically indicated in ROS section below Review of Systems Objective:    BP 102/63 (BP Location: Left Wrist, Patient Position: Sitting, Cuff Size: Normal) Comment: patient machine  Pulse 83   Temp (!) 96.7 F (35.9 C) (Temporal)   Ht 5\' 5"  (1.651 m)   Wt 176 lb 14.4 oz (80.2 kg)  SpO2 98%   BMI 29.44 kg/m   Wt Readings from Last 3 Encounters:  03/15/20 176 lb 14.4 oz (80.2 kg)  02/16/20 180 lb 3 oz (81.7 kg)  11/30/19 176 lb 9.6 oz (80.1 kg)    Physical Exam Vitals and nursing note reviewed.  Constitutional:      Appearance: Normal appearance. She is obese. She is not ill-appearing.  Neck:     Vascular: No carotid bruit.     Comments: Limited ROM to lateral flexion and lateral rotation due to R neck discomfort Musculoskeletal:        General: Tenderness present. No swelling or deformity.     Cervical back: Neck supple. Tenderness present. No rigidity.     Comments:  No midline cervical neck pain Tender to palpation along R  paracervical mm No occipital tenderness No significant trapezius tenderness or spasm.  Lymphadenopathy:     Cervical: No cervical adenopathy.  Skin:    General: Skin is warm and dry.     Findings: No rash.  Neurological:     Mental Status: She is alert.  Psychiatric:        Mood and Affect: Mood normal.        Behavior: Behavior normal.       Results for orders placed or performed in visit on 02/16/20  T4, free  Result Value Ref Range   Free T4 1.07 0.60 - 1.60 ng/dL  VITAMIN D 25 Hydroxy (Vit-D Deficiency, Fractures)  Result Value Ref Range   VITD 50.58 30.00 - 100.00 ng/mL  Vitamin B12  Result Value Ref Range   Vitamin B-12 682 211 - 911 pg/mL  CBC with Differential/Platelet  Result Value Ref Range   WBC 7.2 4.0 - 10.5 K/uL   RBC 4.68 3.87 - 5.11 Mil/uL   Hemoglobin 13.5 12.0 - 15.0 g/dL   HCT 41.0 36.0 - 46.0 %   MCV 87.5 78.0 - 100.0 fl   MCHC 33.0 30.0 - 36.0 g/dL   RDW 14.4 11.5 - 15.5 %   Platelets 220.0 150.0 - 400.0 K/uL   Neutrophils Relative % 66.4 43.0 - 77.0 %   Lymphocytes Relative 18.7 12.0 - 46.0 %   Monocytes Relative 12.9 (H) 3.0 - 12.0 %   Eosinophils Relative 1.7 0.0 - 5.0 %   Basophils Relative 0.3 0.0 - 3.0 %   Neutro Abs 4.8 1.4 - 7.7 K/uL   Lymphs Abs 1.3 0.7 - 4.0 K/uL   Monocytes Absolute 0.9 0.1 - 1.0 K/uL   Eosinophils Absolute 0.1 0.0 - 0.7 K/uL   Basophils Absolute 0.0 0.0 - 0.1 K/uL  TSH  Result Value Ref Range   TSH 4.25 0.35 - 4.50 uIU/mL  Comprehensive metabolic panel  Result Value Ref Range   Sodium 140 135 - 145 mEq/L   Potassium 4.5 3.5 - 5.1 mEq/L   Chloride 104 96 - 112 mEq/L   CO2 29 19 - 32 mEq/L   Glucose, Bld 82 70 - 99 mg/dL   BUN 19 6 - 23 mg/dL   Creatinine, Ser 0.73 0.40 - 1.20 mg/dL   Total Bilirubin 0.6 0.2 - 1.2 mg/dL   Alkaline Phosphatase 87 39 - 117 U/L   AST 18 0 - 37 U/L   ALT 18 0 - 35 U/L   Total Protein 6.7 6.0 - 8.3 g/dL   Albumin 4.1 3.5 - 5.2 g/dL   GFR 76.18 >60.00 mL/min   Calcium 9.0 8.4 -  10.5 mg/dL  Lipid panel  Result Value Ref Range   Cholesterol 193 0 - 200 mg/dL   Triglycerides 59.0 0.0 - 149.0 mg/dL   HDL 77.30 >39.00 mg/dL   VLDL 11.8 0.0 - 40.0 mg/dL   LDL Cholesterol 104 (H) 0 - 99 mg/dL   Total CHOL/HDL Ratio 2    NonHDL 115.61    Assessment & Plan:  This visit occurred during the SARS-CoV-2 public health emergency.  Safety protocols were in place, including screening questions prior to the visit, additional usage of staff PPE, and extensive cleaning of exam room while observing appropriate contact time as indicated for disinfecting solutions.   Problem List Items Addressed This Visit    Neck pain on right side - Primary    Exam consistent with cervical neck strain. Supportive care reviewed. Provided with stretching exercises from [facility] pt advisor. Continue tylenol, heat. Pt declines muscle relaxant. Update if not improving with treatment.       Essential hypertension    Home wrist cuff running too low - she will start using arm cuff she has at home. Continue losartan 100mg  daily.           No orders of the defined types were placed in this encounter.  No orders of the defined types were placed in this encounter.   Follow up plan: Return if symptoms worsen or fail to improve.  Ria Bush, MD

## 2020-03-19 ENCOUNTER — Ambulatory Visit (INDEPENDENT_AMBULATORY_CARE_PROVIDER_SITE_OTHER): Payer: Medicare Other | Admitting: Psychology

## 2020-03-19 DIAGNOSIS — F4323 Adjustment disorder with mixed anxiety and depressed mood: Secondary | ICD-10-CM | POA: Diagnosis not present

## 2020-04-02 ENCOUNTER — Ambulatory Visit (INDEPENDENT_AMBULATORY_CARE_PROVIDER_SITE_OTHER): Payer: Medicare Other | Admitting: Psychology

## 2020-04-02 DIAGNOSIS — F4323 Adjustment disorder with mixed anxiety and depressed mood: Secondary | ICD-10-CM | POA: Diagnosis not present

## 2020-04-16 ENCOUNTER — Ambulatory Visit (INDEPENDENT_AMBULATORY_CARE_PROVIDER_SITE_OTHER): Payer: Medicare Other | Admitting: Psychology

## 2020-04-16 DIAGNOSIS — F4323 Adjustment disorder with mixed anxiety and depressed mood: Secondary | ICD-10-CM

## 2020-04-30 ENCOUNTER — Ambulatory Visit (INDEPENDENT_AMBULATORY_CARE_PROVIDER_SITE_OTHER): Payer: Medicare Other | Admitting: Psychology

## 2020-04-30 DIAGNOSIS — F4323 Adjustment disorder with mixed anxiety and depressed mood: Secondary | ICD-10-CM | POA: Diagnosis not present

## 2020-05-14 ENCOUNTER — Ambulatory Visit (INDEPENDENT_AMBULATORY_CARE_PROVIDER_SITE_OTHER): Payer: Medicare Other | Admitting: Psychology

## 2020-05-14 DIAGNOSIS — F4323 Adjustment disorder with mixed anxiety and depressed mood: Secondary | ICD-10-CM

## 2020-05-28 ENCOUNTER — Ambulatory Visit (INDEPENDENT_AMBULATORY_CARE_PROVIDER_SITE_OTHER): Payer: Medicare Other | Admitting: Psychology

## 2020-05-28 DIAGNOSIS — F4323 Adjustment disorder with mixed anxiety and depressed mood: Secondary | ICD-10-CM | POA: Diagnosis not present

## 2020-05-30 ENCOUNTER — Other Ambulatory Visit: Payer: Self-pay | Admitting: Family Medicine

## 2020-05-30 DIAGNOSIS — E039 Hypothyroidism, unspecified: Secondary | ICD-10-CM

## 2020-05-30 DIAGNOSIS — E559 Vitamin D deficiency, unspecified: Secondary | ICD-10-CM

## 2020-05-31 ENCOUNTER — Ambulatory Visit (INDEPENDENT_AMBULATORY_CARE_PROVIDER_SITE_OTHER): Payer: Medicare Other

## 2020-05-31 ENCOUNTER — Other Ambulatory Visit: Payer: Medicare Other

## 2020-05-31 DIAGNOSIS — Z0000 Encounter for general adult medical examination without abnormal findings: Secondary | ICD-10-CM | POA: Diagnosis not present

## 2020-05-31 NOTE — Progress Notes (Signed)
Subjective:   BAILIE CHRISTENBURY is a 85 y.o. female who presents for Medicare Annual (Subsequent) preventive examination.  Review of Systems: N/A   I connected with the patient today by telephone and verified that I am speaking with the correct person using two identifiers. Location patient: home Location nurse: work Persons participating in the virtual visit: patient, Marine scientist.   I discussed the limitations, risks, security and privacy concerns of performing an evaluation and management service by telephone and the availability of in person appointments. I also discussed with the patient that there may be a patient responsible charge related to this service. The patient expressed understanding and verbally consented to this telephonic visit.    Interactive audio and video telecommunications were attempted between this nurse and patient, however failed, due to patient having technical difficulties OR patient did not have access to video capability.  We continued and completed visit with audio only.     Cardiac Risk Factors include: advanced age (>96men, >59 women);hypertension     Objective:     Vitals: There were no vitals taken for this visit.  There is no height or weight on file to calculate BMI.  Advanced Directives 05/31/2020 05/26/2019 11/29/2018 08/01/2018 03/09/2018 02/19/2017  Does Patient Have a Medical Advance Directive? No No No No No No  Would patient like information on creating a medical advance directive? No - Patient declined No - Patient declined Yes (MAU/Ambulatory/Procedural Areas - Information given) No - Patient declined No - Patient declined -    Tobacco Social History   Tobacco Use  Smoking Status Never Smoker  Smokeless Tobacco Never Used     Counseling given: Not Answered   Clinical Intake:  Pre-visit preparation completed: Yes  Pain : 0-10 Pain Type: Chronic pain Pain Location: Heel Pain Descriptors / Indicators: Aching Pain Onset: More than a month  ago Pain Frequency: Intermittent     Nutritional Risks: None Diabetes: No  How often do you need to have someone help you when you read instructions, pamphlets, or other written materials from your doctor or pharmacy?: 1 - Never What is the last grade level you completed in school?: college graduate  Interpreter Needed?: No  Information entered by :: CJohnson, LPN  Past Medical History:  Diagnosis Date  . Cellulitis 08/10/2018  . Chronic venous insufficiency    with varicose veins  . Glaucoma   . History  of basal cell carcinoma    left nare  . History of chicken pox   . Hypothyroid   . Osteoarthritis    back pain, L knee pain  . Osteopenia 06/2009   DEXA 11/2015: T -2.3 hip, -2.2 spine, on longterm bisphosphonate/evista  . Pyogenic granuloma 04/16/2016   Past Surgical History:  Procedure Laterality Date  . BREAST BIOPSY     core  . CATARACT EXTRACTION     bilateral  . DEXA  06/2009   T score Spine: -1.8, hip -2.0  . Foam sclerotherapy Bilateral 09/2015   [place], [place]  . NASAL RECONSTRUCTION  2005   for BCC L nare s/p Mohs  . nuclear stress test  2014   normal in [place]  . RADIOFREQUENCY ABLATION Left 01/2012   remnant L G saphenous vein below knee  . RADIOFREQUENCY ABLATION Right 02/2013   RLE vein ablation   . VEIN LIGATION AND STRIPPING  remote   bilateral G saphenous veins   Family History  Problem Relation Age of Onset  . Cancer Mother  thyroid cancer  . Heart disease Father        CHF  . Diabetes Father   . Glaucoma Father   . Arthritis Father   . Coronary artery disease Maternal Uncle   . Bipolar disorder Son   . Stroke Neg Hx   . Breast cancer Neg Hx    Social History   Socioeconomic History  . Marital status: Unknown    Spouse name: Not on file  . Number of children: 2  . Years of education: Not on file  . Highest education level: Not on file  Occupational History    Employer: RETIRED  Tobacco Use  . Smoking status:  Never Smoker  . Smokeless tobacco: Never Used  Substance and Sexual Activity  . Alcohol use: Yes    Comment: Occasionally drinks wine  . Drug use: No  . Sexual activity: Not Currently    Birth control/protection: Post-menopausal  Other Topics Concern  . Not on file  Social History Narrative   From [place], [place]   Caffeine: 2-3 cups/day   Lives with husband, majority of time alone as he travels to [place].   Has grandchildren nearby.   Occ: Field seismologist, Metallurgist   Activity: bed exercises, limiting walking   Diet: does get fruits and vegetables, only some water   Social Determinants of Health   Financial Resource Strain: Low Risk   . Difficulty of Paying Living Expenses: Not hard at all  Food Insecurity: No Food Insecurity  . Worried About Charity fundraiser in the Last Year: Never true  . Ran Out of Food in the Last Year: Never true  Transportation Needs: No Transportation Needs  . Lack of Transportation (Medical): No  . Lack of Transportation (Non-Medical): No  Physical Activity: Sufficiently Active  . Days of Exercise per Week: 7 days  . Minutes of Exercise per Session: 40 min  Stress: No Stress Concern Present  . Feeling of Stress : Not at all  Social Connections:   . Frequency of Communication with Friends and Family:   . Frequency of Social Gatherings with Friends and Family:   . Attends Religious Services:   . Active Member of Clubs or Organizations:   . Attends Archivist Meetings:   Marland Kitchen Marital Status:     Outpatient Encounter Medications as of 05/31/2020  Medication Sig  . Ascorbic Acid (VITAMIN C CR PO) Take by mouth as directed. Reported on 12/17/2015  . augmented betamethasone dipropionate (DIPROLENE-AF) 0.05 % cream 2 (two) times daily as needed. Appy to active flares on body twice a day as needed  . CALCIUM CARBONATE PO Take 600 mg by mouth as directed. Reported on 12/17/2015  . Cholecalciferol (VITAMIN D) 50 MCG (2000 UT) CAPS Take 1  capsule (2,000 Units total) by mouth daily.  Marland Kitchen CICLOPIROX EX Apply topically as needed. Reported on 12/17/2015  . desonide (DESOWEN) 0.05 % cream APPLY TO AFFECTED AREA TWICE A DAY (Patient taking differently: as needed. )  . diclofenac sodium (VOLTAREN) 1 % GEL Apply 1 application topically 3 (three) times daily.  . halobetasol (ULTRAVATE) 0.05 % ointment APPLY TO AFFECTED AREA TWICE A DAY  . levothyroxine (SYNTHROID) 75 MCG tablet Take 1 tablet (75 mcg total) by mouth daily.  Marland Kitchen losartan (COZAAR) 100 MG tablet TAKE 1 TABLET BY MOUTH EVERY DAY  . Multiple Vitamin (MULTIVITAMIN) tablet Take 1 tablet by mouth as needed. Reported on 12/17/2015  . NITROSTAT 0.4 MG SL tablet every 5 (five) minutes  as needed. Reported on 04/16/2016  . Travoprost, BAK Free, (TRAVATAN) 0.004 % SOLN ophthalmic solution Place 1 drop into both eyes at bedtime.   No facility-administered encounter medications on file as of 05/31/2020.    Activities of Daily Living In your present state of health, do you have any difficulty performing the following activities: 05/31/2020  Hearing? N  Vision? N  Difficulty concentrating or making decisions? N  Walking or climbing stairs? N  Dressing or bathing? N  Doing errands, shopping? N  Preparing Food and eating ? N  Using the Toilet? N  In the past six months, have you accidently leaked urine? N  Do you have problems with loss of bowel control? N  Managing your Medications? N  Managing your Finances? N  Housekeeping or managing your Housekeeping? N  Some recent data might be hidden    Patient Care Team: Ria Bush, MD as PCP - General (Family Medicine)    Assessment:   This is a routine wellness examination for Ruwayda.  Exercise Activities and Dietary recommendations Current Exercise Habits: Home exercise routine, Type of exercise: stretching, Time (Minutes): 35, Frequency (Times/Week): 7, Weekly Exercise (Minutes/Week): 245, Intensity: Moderate, Exercise limited by:  None identified  Goals    . Patient Stated     Starting 05/26/19, I will continue to stretch for 35 minutes every other day.     . Patient Stated     05/31/2020, I will continue to do stretching exercises everyday for 35 minutes.        Fall Risk Fall Risk  05/31/2020 05/26/2019 03/09/2018 02/19/2017 10/23/2016  Falls in the past year? 0 1 No No No  Comment - fell in hospital - - -  Number falls in past yr: 0 0 - - -  Injury with Fall? 0 1 - - -  Risk for fall due to : Medication side effect - - - -  Follow up Falls evaluation completed;Falls prevention discussed - - - -   Is the patient's home free of loose throw rugs in walkways, pet beds, electrical cords, etc?   yes      Grab bars in the bathroom? no      Handrails on the stairs?   no      Adequate lighting?   yes  Timed Get Up and Go performed: N/A  Depression Screen PHQ 2/9 Scores 05/31/2020 05/26/2019 03/09/2018 02/19/2017  PHQ - 2 Score 0 0 0 0  PHQ- 9 Score 0 0 0 -     Cognitive Function MMSE - Mini Mental State Exam 05/31/2020 05/26/2019 03/09/2018 02/19/2017  Orientation to time 5 5 5 5   Orientation to Place 5 5 5 5   Registration 3 3 3 3   Attention/ Calculation 5 0 0 0  Recall 3 3 3 3   Language- name 2 objects - 0 0 0  Language- repeat 1 1 1 1   Language- follow 3 step command - 0 3 3  Language- read & follow direction - 0 0 0  Write a sentence - 0 0 0  Copy design - 0 0 0  Total score - 17 20 20   Mini Cog  Mini-Cog screen was completed. Maximum score is 22. A value of 0 denotes this part of the MMSE was not completed or the patient failed this part of the Mini-Cog screening.       Immunization History  Administered Date(s) Administered  . Fluad Quad(high Dose 65+) 09/22/2019  . Hep A / Hep B 06/05/2015,  12/17/2015  . Influenza Split 11/15/2012  . Influenza, High Dose Seasonal PF 11/14/2014  . Influenza,inj,Quad PF,6+ Mos 09/06/2013, 12/18/2016, 10/05/2018  . PFIZER SARS-COV-2 Vaccination 01/28/2020, 02/20/2020    . Pneumococcal Conjugate-13 10/30/2014  . Pneumococcal Polysaccharide-23 11/15/2012  . Td 05/24/2012  . Zoster 07/03/2014    Qualifies for Shingles Vaccine: yes  Screening Tests Health Maintenance  Topic Date Due  . DTAP VACCINES (1) 07/13/1935  . INFLUENZA VACCINE  07/28/2020  . DTaP/Tdap/Td (2 - Tdap) 05/24/2022  . TETANUS/TDAP  05/24/2022  . DEXA SCAN  Completed  . COVID-19 Vaccine  Completed  . PNA vac Low Risk Adult  Completed    Cancer Screenings: Lung: Low Dose CT Chest recommended if Age 49-80 years, 30 pack-year currently smoking OR have quit w/in 15 years. Patient does not qualify. Breast:  Up to date on Mammogram: declined   Up to date of Bone Density/Dexa: Yes, completed 10/30/2019 Colorectal: no longer required  Additional Screenings:  Hepatitis C Screening: N/A     Plan:   Patient will continue to do stretching exercises everyday for 35 minutes.   I have personally reviewed and noted the following in the patient's chart:   . Medical and social history . Use of alcohol, tobacco or illicit drugs  . Current medications and supplements . Functional ability and status . Nutritional status . Physical activity . Advanced directives . List of other physicians . Hospitalizations, surgeries, and ER visits in previous 12 months . Vitals . Screenings to include cognitive, depression, and falls . Referrals and appointments  In addition, I have reviewed and discussed with patient certain preventive protocols, quality metrics, and best practice recommendations. A written personalized care plan for preventive services as well as general preventive health recommendations were provided to patient.     Andrez Grime, LPN  01/04/8415

## 2020-05-31 NOTE — Progress Notes (Signed)
PCP notes:  Health Maintenance: No gaps noted   Abnormal Screenings: none   Patient concerns: none   Nurse concerns: none   Next PCP appt: 06/05/2020 @ 3:15 pm

## 2020-05-31 NOTE — Patient Instructions (Signed)
Beth Rodgers , Thank you for taking time to come for your Medicare Wellness Visit. I appreciate your ongoing commitment to your health goals. Please review the following plan we discussed and let me know if I can assist you in the future.   Screening recommendations/referrals: Colonoscopy: no longer required Mammogram: declined Bone Density: Up to date, completed 10/30/2019 Recommended yearly ophthalmology/optometry visit for glaucoma screening and checkup Recommended yearly dental visit for hygiene and checkup  Vaccinations: Influenza vaccine: Up to date, completed 09/22/2019 Pneumococcal vaccine: Completed series Tdap vaccine: Up to date, completed 05/24/2012 Shingles vaccine: discussed    Advanced directives: Advance directive discussed with you today. Even though you declined this today please call our office should you change your mind and we can give you the proper paperwork for you to fill out.   Conditions/risks identified: hypertension  Next appointment: 06/05/2020 @ 3:15 pm    Preventive Care 83 Years and Older, Female Preventive care refers to lifestyle choices and visits with your health care provider that can promote health and wellness. What does preventive care include?  A yearly physical exam. This is also called an annual well check.  Dental exams once or twice a year.  Routine eye exams. Ask your health care provider how often you should have your eyes checked.  Personal lifestyle choices, including:  Daily care of your teeth and gums.  Regular physical activity.  Eating a healthy diet.  Avoiding tobacco and drug use.  Limiting alcohol use.  Practicing safe sex.  Taking low-dose aspirin every day.  Taking vitamin and mineral supplements as recommended by your health care provider. What happens during an annual well check? The services and screenings done by your health care provider during your annual well check will depend on your age, overall health,  lifestyle risk factors, and family history of disease. Counseling  Your health care provider may ask you questions about your:  Alcohol use.  Tobacco use.  Drug use.  Emotional well-being.  Home and relationship well-being.  Sexual activity.  Eating habits.  History of falls.  Memory and ability to understand (cognition).  Work and work Statistician.  Reproductive health. Screening  You may have the following tests or measurements:  Height, weight, and BMI.  Blood pressure.  Lipid and cholesterol levels. These may be checked every 5 years, or more frequently if you are over 80 years old.  Skin check.  Lung cancer screening. You may have this screening every year starting at age 26 if you have a 30-pack-year history of smoking and currently smoke or have quit within the past 15 years.  Fecal occult blood test (FOBT) of the stool. You may have this test every year starting at age 9.  Flexible sigmoidoscopy or colonoscopy. You may have a sigmoidoscopy every 5 years or a colonoscopy every 10 years starting at age 72.  Hepatitis C blood test.  Hepatitis B blood test.  Sexually transmitted disease (STD) testing.  Diabetes screening. This is done by checking your blood sugar (glucose) after you have not eaten for a while (fasting). You may have this done every 1-3 years.  Bone density scan. This is done to screen for osteoporosis. You may have this done starting at age 14.  Mammogram. This may be done every 1-2 years. Talk to your health care provider about how often you should have regular mammograms. Talk with your health care provider about your test results, treatment options, and if necessary, the need for more tests. Vaccines  Your health care provider may recommend certain vaccines, such as:  Influenza vaccine. This is recommended every year.  Tetanus, diphtheria, and acellular pertussis (Tdap, Td) vaccine. You may need a Td booster every 10 years.  Zoster  vaccine. You may need this after age 66.  Pneumococcal 13-valent conjugate (PCV13) vaccine. One dose is recommended after age 21.  Pneumococcal polysaccharide (PPSV23) vaccine. One dose is recommended after age 83. Talk to your health care provider about which screenings and vaccines you need and how often you need them. This information is not intended to replace advice given to you by your health care provider. Make sure you discuss any questions you have with your health care provider. Document Released: 01/10/2016 Document Revised: 09/02/2016 Document Reviewed: 10/15/2015 Elsevier Interactive Patient Education  2017 [place] Prevention in the Home Falls can cause injuries. They can happen to people of all ages. There are many things you can do to make your home safe and to help prevent falls. What can I do on the outside of my home?  Regularly fix the edges of walkways and driveways and fix any cracks.  Remove anything that might make you trip as you walk through a door, such as a raised step or threshold.  Trim any bushes or trees on the path to your home.  Use bright outdoor lighting.  Clear any walking paths of anything that might make someone trip, such as rocks or tools.  Regularly check to see if handrails are loose or broken. Make sure that both sides of any steps have handrails.  Any raised decks and porches should have guardrails on the edges.  Have any leaves, snow, or ice cleared regularly.  Use sand or salt on walking paths during winter.  Clean up any spills in your garage right away. This includes oil or grease spills. What can I do in the bathroom?  Use night lights.  Install grab bars by the toilet and in the tub and shower. Do not use towel bars as grab bars.  Use non-skid mats or decals in the tub or shower.  If you need to sit down in the shower, use a plastic, non-slip stool.  Keep the floor dry. Clean up any water that spills on the  floor as soon as it happens.  Remove soap buildup in the tub or shower regularly.  Attach bath mats securely with double-sided non-slip rug tape.  Do not have throw rugs and other things on the floor that can make you trip. What can I do in the bedroom?  Use night lights.  Make sure that you have a light by your bed that is easy to reach.  Do not use any sheets or blankets that are too big for your bed. They should not hang down onto the floor.  Have a firm chair that has side arms. You can use this for support while you get dressed.  Do not have throw rugs and other things on the floor that can make you trip. What can I do in the kitchen?  Clean up any spills right away.  Avoid walking on wet floors.  Keep items that you use a lot in easy-to-reach places.  If you need to reach something above you, use a strong step stool that has a grab bar.  Keep electrical cords out of the way.  Do not use floor polish or wax that makes floors slippery. If you must use wax, use non-skid floor wax.  Do  not have throw rugs and other things on the floor that can make you trip. What can I do with my stairs?  Do not leave any items on the stairs.  Make sure that there are handrails on both sides of the stairs and use them. Fix handrails that are broken or loose. Make sure that handrails are as long as the stairways.  Check any carpeting to make sure that it is firmly attached to the stairs. Fix any carpet that is loose or worn.  Avoid having throw rugs at the top or bottom of the stairs. If you do have throw rugs, attach them to the floor with carpet tape.  Make sure that you have a light switch at the top of the stairs and the bottom of the stairs. If you do not have them, ask someone to add them for you. What else can I do to help prevent falls?  Wear shoes that:  Do not have high heels.  Have rubber bottoms.  Are comfortable and fit you well.  Are closed at the toe. Do not wear  sandals.  If you use a stepladder:  Make sure that it is fully opened. Do not climb a closed stepladder.  Make sure that both sides of the stepladder are locked into place.  Ask someone to hold it for you, if possible.  Clearly mark and make sure that you can see:  Any grab bars or handrails.  First and last steps.  Where the edge of each step is.  Use tools that help you move around (mobility aids) if they are needed. These include:  Canes.  Walkers.  Scooters.  Crutches.  Turn on the lights when you go into a dark area. Replace any light bulbs as soon as they burn out.  Set up your furniture so you have a clear path. Avoid moving your furniture around.  If any of your floors are uneven, fix them.  If there are any pets around you, be aware of where they are.  Review your medicines with your doctor. Some medicines can make you feel dizzy. This can increase your chance of falling. Ask your doctor what other things that you can do to help prevent falls. This information is not intended to replace advice given to you by your health care provider. Make sure you discuss any questions you have with your health care provider. Document Released: 10/10/2009 Document Revised: 05/21/2016 Document Reviewed: 01/18/2015 Elsevier Interactive Patient Education  2017 [employer].

## 2020-06-03 ENCOUNTER — Other Ambulatory Visit: Payer: Self-pay

## 2020-06-03 ENCOUNTER — Encounter: Payer: Self-pay | Admitting: Family Medicine

## 2020-06-03 ENCOUNTER — Telehealth: Payer: Self-pay

## 2020-06-03 ENCOUNTER — Other Ambulatory Visit: Payer: Self-pay | Admitting: Family Medicine

## 2020-06-03 ENCOUNTER — Ambulatory Visit (INDEPENDENT_AMBULATORY_CARE_PROVIDER_SITE_OTHER): Payer: Medicare Other | Admitting: Family Medicine

## 2020-06-03 VITALS — BP 126/74 | HR 76 | Temp 98.2°F | Ht 65.0 in | Wt 177.5 lb

## 2020-06-03 DIAGNOSIS — R0789 Other chest pain: Secondary | ICD-10-CM | POA: Diagnosis not present

## 2020-06-03 MED ORDER — DESONIDE 0.05 % EX CREA: TOPICAL_CREAM | CUTANEOUS | 1 refills | Status: DC

## 2020-06-03 NOTE — Telephone Encounter (Signed)
Pt c/o intermittent chest pressure for more than one month. Pt denies chest pain and reports it is only pressure. Pt denies SOB or radiating pain. Pt reports she has also had intermittent dizziness for a couple of months and that sometimes she doesn't feel well to go out by herself. She could not elaborate on that feeling. She reports she thinks the chest pressure is anxiety due to her son being home. Pt reports if she sits for a while and tries to relax and calm her nerves the pressure will stop. She does not take anything for it. She has two BP readings from an arm cuff this morning of 132-83 and 141-82. Pt is taking prescribed losartan.  Advised pt of ER precautions and that a msg would be sent to Dr. Darnell Level and this office would f/u.  Advised to monitor BP twice daily or when she is having chest pressure and f/u with office if reading over 160/90. Pt verbalized understanding.  Pt already has a 30 minute apt on 6/9 with PCP.

## 2020-06-03 NOTE — Telephone Encounter (Signed)
Pharmacy is requesting an alternative. 

## 2020-06-03 NOTE — Progress Notes (Signed)
This visit was conducted in person.  BP 126/74 (BP Location: Left Arm, Patient Position: Sitting, Cuff Size: Normal)    Pulse 76    Temp 98.2 F (36.8 C) (Temporal)    Ht 5\' 5"  (1.651 m)    Wt 177 lb 8 oz (80.5 kg)    SpO2 96%    BMI 29.54 kg/m    CC: chest discomfort Subjective:    Patient ID: Beth Rodgers, female    DOB: 1935-06-24, 85 y.o.   MRN: [id number]  HPI: Beth Rodgers is a 85 y.o. female presenting on 06/03/2020 for Chest Pain (C/o chest pressure.  Denies any pain.  Started about 1 mo ago. )   2 mo ago bipolar son moved in with them - this has been a more stressful period for her. He was previously in prison. She finds she's more anxious due to this. She continues seeing counselor Q2 wks with benefit.   Over the past month she feels substernal pressure sensation in chest that lasted about an hour associated with dyspnea, not exertional. Resolved on its own over time. Overall sedentary.   No nausea, diaphoresis, radiation up jaw or down arm, dizziness.  HTN stable on losartan.  She does take calcium 600mg  daily.      Relevant past medical, surgical, family and social history reviewed and updated as indicated. Interim medical history since our last visit reviewed. Allergies and medications reviewed and updated. Outpatient Medications Prior to Visit  Medication Sig Dispense Refill   Ascorbic Acid (VITAMIN C CR PO) Take by mouth as directed. Reported on 12/17/2015     CALCIUM CARBONATE PO Take 600 mg by mouth as directed. Reported on 12/17/2015     Cholecalciferol (VITAMIN D) 50 MCG (2000 UT) CAPS Take 1 capsule (2,000 Units total) by mouth daily. 30 capsule    CICLOPIROX EX Apply topically as needed. Reported on 12/17/2015     diclofenac sodium (VOLTAREN) 1 % GEL Apply 1 application topically 3 (three) times daily. 1 Tube 0   halobetasol (ULTRAVATE) 0.05 % ointment APPLY TO AFFECTED AREA TWICE A DAY  1   levothyroxine (SYNTHROID) 75 MCG tablet Take 1 tablet (75 mcg  total) by mouth daily. 90 tablet 1   losartan (COZAAR) 100 MG tablet TAKE 1 TABLET BY MOUTH EVERY DAY 90 tablet 1   Multiple Vitamin (MULTIVITAMIN) tablet Take 1 tablet by mouth as needed. Reported on 12/17/2015     NITROSTAT 0.4 MG SL tablet every 5 (five) minutes as needed. Reported on 04/16/2016     Travoprost, BAK Free, (TRAVATAN) 0.004 % SOLN ophthalmic solution Place 1 drop into both eyes at bedtime.     desonide (DESOWEN) 0.05 % cream APPLY TO AFFECTED AREA TWICE A DAY (Patient taking differently: as needed. ) 30 g 1   augmented betamethasone dipropionate (DIPROLENE-AF) 0.05 % cream 2 (two) times daily as needed. Appy to active flares on body twice a day as needed  1   No facility-administered medications prior to visit.     Per HPI unless specifically indicated in ROS section below Review of Systems Objective:  BP 126/74 (BP Location: Left Arm, Patient Position: Sitting, Cuff Size: Normal)    Pulse 76    Temp 98.2 F (36.8 C) (Temporal)    Ht 5\' 5"  (1.651 m)    Wt 177 lb 8 oz (80.5 kg)    SpO2 96%    BMI 29.54 kg/m   Wt Readings from Last 3 Encounters:  06/03/20  177 lb 8 oz (80.5 kg)  03/15/20 176 lb 14.4 oz (80.2 kg)  02/16/20 180 lb 3 oz (81.7 kg)      Physical Exam Vitals and nursing note reviewed.  Constitutional:      Appearance: Normal appearance. She is not ill-appearing.  Eyes:     Extraocular Movements: Extraocular movements intact.     Pupils: Pupils are equal, round, and reactive to light.  Cardiovascular:     Rate and Rhythm: Normal rate and regular rhythm.     Pulses: Normal pulses.     Heart sounds: Normal heart sounds. No murmur.  Pulmonary:     Effort: Pulmonary effort is normal. No respiratory distress.     Breath sounds: Normal breath sounds. No wheezing, rhonchi or rales.  Musculoskeletal:     Right lower leg: No edema.     Left lower leg: No edema.  Skin:    General: Skin is warm and dry.     Findings: No rash.  Neurological:     Mental  Status: She is alert.  Psychiatric:        Mood and Affect: Mood normal.        Behavior: Behavior normal.       Lab Results  Component Value Date   CHOL 193 02/16/2020   HDL 77.30 02/16/2020   LDLCALC 104 (H) 02/16/2020   LDLDIRECT 124.9 08/31/2013   TRIG 59.0 02/16/2020   CHOLHDL 2 02/16/2020    EKG - NSR rate 60s, normal axis, intervals, no acute ST/T changes Assessment & Plan:  This visit occurred during the SARS-CoV-2 public health emergency.  Safety protocols were in place, including screening questions prior to the visit, additional usage of staff PPE, and extensive cleaning of exam room while observing appropriate contact time as indicated for disinfecting solutions.   Problem List Items Addressed This Visit    Chest pressure - Primary    Reassuring EKG today. Anticipate stress related. However given age and description reasonable to refer to cardiology for further risk stratification. Pt agrees with plan.  Reviewed stress relieving strategies - she will look up deep breathing relaxation techniques.       Relevant Orders   EKG 12-Lead (Completed)   Ambulatory referral to Cardiology       Meds ordered this encounter  Medications   desonide (DESOWEN) 0.05 % cream    Sig: APPLY TO AFFECTED AREA TWICE A DAY    Dispense:  30 g    Refill:  1   Orders Placed This Encounter  Procedures   Ambulatory referral to Cardiology    Referral Priority:   Routine    Referral Type:   Consultation    Referral Reason:   Specialty Services Required    Requested Specialty:   Cardiology    Number of Visits Requested:   1   EKG 12-Lead    Patient instructions: Electrocardiograma se ve bien hoy.  Si vale la pena ver al cardiologo para Chief Strategy Officer.  Siga haciendo ejercicios que ayuden a Theatre manager bajo el estress.   Follow up plan: Return if symptoms worsen or fail to improve.  Ria Bush, MD

## 2020-06-03 NOTE — Patient Instructions (Signed)
Electrocardiograma se ve bien hoy.  Si vale la pena ver al cardiologo para Chief Strategy Officer.  Siga haciendo ejercicios que ayuden a Theatre manager bajo el estress.   Control del Public relations account executive Stress, Adult Sentir un cierto nivel de estrs es normal. El estrs ayuda a que el cuerpo y Arboriculturist se preparen para enfrentar las exigencias de la vida. Las hormonas del estrs pueden motivarlo a hacer bien su trabajo y a cumplir con sus responsabilidades. Sin embargo, el estrs intenso o prolongado (crnico) puede Print production planner su salud mental y fsica. El estrs crnico aumenta el riesgo de sufrir ansiedad, depresin y otros problemas de [place], como problemas digestivos, dolores musculares, enfermedades cardacas, hipertensin arterial y accidente cerebrovascular. Cules son las causas? Las causas frecuentes de estrs son las siguientes:  Las exigencias del trabajo, como las fechas lmite, la sensacin de Fish farm manager en exceso o el trabajar muchas horas.  Las Paramedic, como problemas de dinero, desacuerdos con un cnyuge o problemas en la crianza de los nios.  Las presiones por [employer] en la vida, como un divorcio, [place], prdida de un ser querido o enfermedad crnica. Usted puede correr un riesgo ms alto de tener problemas relacionados con el estrs si no duerme lo suficiente, no tiene buena salud, no tiene [employer] o tiene un trastorno de salud mental como ansiedad o depresin. Cmo reconocer el estrs El estrs puede hacer que usted:  Tenga dificultad para dormir.  Se sienta triste, ansioso, irritable o abrumado.  Pierda el apetito.  Coma en exceso o tenga deseos de comer alimentos poco saludables.  Tenga deseos de consumir drogas o alcohol. El estrs tambin puede causar sntomas fsicos, por ejemplo:  Msculos tensos y doloridos, especialmente en los hombros y el cuello.  Dolores de [place].  Dificultad para respirar.  Frecuencia cardaca  ms rpida.  Dolor de [place], nuseas o vmitos.  Diarrea o estreimiento.  Dificultad para concentrarse. Siga estas instrucciones en su casa: Estilo de vida  Identifique el origen del estrs y su reaccin a este. Consulte a un terapeuta que lo ayude a Scientist, clinical (histocompatibility and immunogenetics).  Cuando se presenten acontecimientos estresantes: ? Hable de ello con su familia, amigos o compaeros de trabajo. ? Trate de pensar de un modo realista los acontecimientos estresantes y de no ignorarlos ni Horticulturist, commercial. ? Intente encontrar los aspectos positivos en una situacin estresante y de no enfocarse en los negativos. ? Reduzca sus responsabilidades en el trabajo y en su casa, si es posible. Pida ayuda a sus amigos o familiares si la necesita.  Encuentre formas de lidiar con Dealer, tales como: ? Meditacin. ? Respiracin profunda. ? Yoga o tai chi. ? Relajacin muscular progresiva. ? Hacer arte, tocar msica o leer. ? Hacerse tiempo para Data processing manager divertidas. ? Pasar tiempo con amigos y familiares.  Recibir apoyo de familiares, amigos o recursos espirituales. Comida y bebida  Siga una dieta saludable. Esto puede comprender lo siguiente: ? Consuma alimentos ricos en fibra, como frijoles, cereales integrales, y frutas y verduras frescas. ? Limite el consumo de alimentos ricos en grasas y azcares procesados, como alimentos fritos o dulces.  No saltee comidas ni coma en exceso.  Beba suficiente lquido como para Theatre manager la orina de color [place] plido. Consumo de alcohol  No beba alcohol si: ? Su mdico le indica no hacerlo. ? Est embarazada, puede estar embarazada o est tratando de quedar embarazada.  El consumo de alcohol es una forma  en la que algunas personas tratan de Programmer, multimedia. Esto puede ser peligroso, por lo que si bebe alcohol: ? Limite la cantidad que bebe:  De 0 a 1 medida por da para las mujeres.  De 0 a 2 medidas por da para los  hombres. ? Est atento a la cantidad de alcohol que hay en las bebidas que toma. En los [place], una medida equivale a una botella de cerveza de 12oz (313m), un vaso de vino de 5oz (1413m o un vaso de una bebida alcohlica de alta graduacin de 1oz (4437m Actividad   Incluya 32m70mos de ejercicio en su cronograma diario. El ejercicio es un buen recurso para reducir el eDealernclLloyd Hugersu da para realizar una actividad que le resulte relajante. Pruebe caminar, andar en bicicleta, leer un libro o escuchar msica.  Programe su tiempo de una manera que reduzca el estrs y mantenga un cronograma uniforme. D prioridad a las tareas ms importantes. Instrucciones generales  Duerma lo suficiente. Trate de irse a dormir y levaHoliday representativea misma hora todos los [place] los medicamentos de venta libre y los recetados solamente como se lo haya indicado el mdico.  No consuma ningn producto que contenga nicotina o tabaco, como cigarrillos, cigarrillos electrnicos y tabaco de mascHigher education careers adviser necesita ayuda para dejar de fumar, consulte al mdico.  No consuma drogas ni fume para sobrellevar el estrs.  Concurra a todas las visitas de seguimiento como se lo haya indicado el mdico. Esto es importante. Dnde buscar apoyo  Converse con el mdico sobre cmo controlar el estrs o buscar un grupo de apoyo.  Busque un terapeuta para que trabaje con usted sobre las tcnicas de control del estrs. Comunquese con un mdico si:  Sus sntomas de estrGeneticist, molecularo puede controlar el estrs en su casa.  Tiene dificultades para dejar de consumir drogas o alcohol. Solicite ayuda de inmediato si:  Podra ser un peligro para usted mismo o para los dems.  Tiene cualquier pensamiento de muerte o suicidio. Si alguna vez siente que puede lastimarse o lastMarket researchertraProducer, television/film/videotiene pensamientos de poner fin a su vida, busque ayuda de inmediato. Puede dirigirse al  servicio de emergencias ms cercano o comunicarse con:  Servicio de emergencias de su localidad (911 en [place]  Una lnea de asistencia al suicida y atenFreight forwardercrisis, como National Suicide Prevention Lifeline (LneaAngwinl 1-80615-876-9039t disponible las 24 horas del da. Resumen  Sentir un cierto nivel de estrs es normal, pero el estrs intenso o prolongado (crnico) puede afecPrint production plannersalud mental y fsica.  El estrs crnico puede aumentar el riesgo de sufrir ansiedad, depresin y otros problemas de saluMillsboromo problemas digestivos, dolores musculares, enfermedades cardacas, hipertensin arterial y accidente cerebrovascular.  Usted puede correr un riesgo ms alto de tener problemas relacionados con el estrs si no duerme lo suficiente, no tiene buena salud, carece de apoySCANA Corporationiene un trastorno de salud mental como ansiedad o depresin.  Identifique el origen del estrs y su reaccin a este. Trate de hablDu Pontntecimientos estresantes con familiares, amigos o compaeros de trabVirginia encoPension scheme managermtodo para sobrellevar la situacin o de obtener apoy85recursos espirituales.  Si necesita ms ayuda, hable con el mdico para encontrar un grupo de apoyo o un terapeuta de salud mental. Esta informacin no tiene comoMarine scientistconsejo del mdico. Asegrese de hacerle al mdico  cualquier pregunta que tenga. Document Revised: 08/30/2019 Document Reviewed: 08/30/2019 Elsevier Patient Education  [place].

## 2020-06-03 NOTE — Assessment & Plan Note (Signed)
Reassuring EKG today. Anticipate stress related. However given age and description reasonable to refer to cardiology for further risk stratification. Pt agrees with plan.  Reviewed stress relieving strategies - she will look up deep breathing relaxation techniques.

## 2020-06-03 NOTE — Telephone Encounter (Signed)
Spoke with pt asking about her coming in at 3:30 today for OV.  She agrees and will try to be her by 3:15.  Pt will be added to schedule.

## 2020-06-03 NOTE — Telephone Encounter (Signed)
Can she come in at 3:30pm today for eval?

## 2020-06-04 ENCOUNTER — Encounter: Payer: Self-pay | Admitting: Cardiovascular Disease

## 2020-06-04 ENCOUNTER — Ambulatory Visit (INDEPENDENT_AMBULATORY_CARE_PROVIDER_SITE_OTHER): Payer: Medicare Other | Admitting: Cardiovascular Disease

## 2020-06-04 VITALS — BP 110/90 | HR 70 | Wt 176.4 lb

## 2020-06-04 DIAGNOSIS — E039 Hypothyroidism, unspecified: Secondary | ICD-10-CM

## 2020-06-04 DIAGNOSIS — R079 Chest pain, unspecified: Secondary | ICD-10-CM

## 2020-06-04 DIAGNOSIS — R0602 Shortness of breath: Secondary | ICD-10-CM

## 2020-06-04 DIAGNOSIS — I10 Essential (primary) hypertension: Secondary | ICD-10-CM | POA: Diagnosis not present

## 2020-06-04 NOTE — Progress Notes (Signed)
Cardiology Office Note  Date:  06/04/2020   ID:  Beth Rodgers, DOB 06/24/1935, MRN [id number]  PCP:  Beth Bush, MD   Chief Complaint  Patient presents with  . office visit    Pt having chest pressure/ dizziness at times/ swelling/ pt gets SOB but states doesn't know how to properly breathe/ fatigue every hour have to take a break to sit down. Meds verbally reviewed w/ pt.    HPI:   [name] is a pleasant 85 year old woman with past medical history of Hypertension Deconditioning Hypothyroidism Gait instability Who presents by referral from Dr. Danise Rodgers for shortness of breath and chest discomfort  She reports a 6 months SOB, tightness,  When she is walking, she has to stop secondary to breathing, burning  She is concerned as husband had bypass, recent friend also required bypass Wonders if her symptoms as detailed above could be from stress or anxiety  No regular activity, legs are weak Needs assistance getting up onto the exam table today  Other stressors at home 69 yo bipolar son, moved in [place] with brain tumor  Lab work reviewed Total chol 193 LDL 104  EKG personally reviewed by myself on todays visit NSR rate 70 bpm, with PACs  She does not appreciate any tachycardia or palpitations  Father died 93 with smoker,  Cardiomyopathy? Mother died 72, non cardiac  PMH:   has a past medical history of Cellulitis (08/10/2018), Chronic venous insufficiency, Glaucoma, History  of basal cell carcinoma, History of chicken pox, Hypothyroid, Osteoarthritis, Osteopenia (06/2009), and Pyogenic granuloma (04/16/2016).  PSH:    Past Surgical History:  Procedure Laterality Date  . BREAST BIOPSY     core  . CATARACT EXTRACTION     bilateral  . DEXA  06/2009   T score Spine: -1.8, hip -2.0  . Foam sclerotherapy Bilateral 09/2015   [place], [place]  . NASAL RECONSTRUCTION  2005   for BCC L nare s/p Mohs  . nuclear stress test  2014   normal in [place]   . RADIOFREQUENCY ABLATION Left 01/2012   remnant L G saphenous vein below knee  . RADIOFREQUENCY ABLATION Right 02/2013   RLE vein ablation   . VEIN LIGATION AND STRIPPING  remote   bilateral G saphenous veins    Current Outpatient Medications  Medication Sig Dispense Refill  . Ascorbic Acid (VITAMIN C CR PO) Take by mouth as directed. Reported on 12/17/2015    . CALCIUM CARBONATE PO Take 600 mg by mouth as directed. Reported on 12/17/2015    . Cholecalciferol (VITAMIN D) 50 MCG (2000 UT) CAPS Take 1 capsule (2,000 Units total) by mouth daily. 30 capsule   . CICLOPIROX EX Apply topically as needed. Reported on 12/17/2015    . desonide (DESOWEN) 0.05 % cream APPLY TO AFFECTED AREA TWICE A DAY 30 g 1  . diclofenac sodium (VOLTAREN) 1 % GEL Apply 1 application topically 3 (three) times daily. 1 Tube 0  . halobetasol (ULTRAVATE) 0.05 % ointment APPLY TO AFFECTED AREA TWICE A DAY  1  . levothyroxine (SYNTHROID) 75 MCG tablet Take 1 tablet (75 mcg total) by mouth daily. 90 tablet 1  . losartan (COZAAR) 100 MG tablet TAKE 1 TABLET BY MOUTH EVERY DAY 90 tablet 1  . Multiple Vitamin (MULTIVITAMIN) tablet Take 1 tablet by mouth as needed. Reported on 12/17/2015    . NITROSTAT 0.4 MG SL tablet every 5 (five) minutes as needed. Reported on 04/16/2016    . Travoprost, BAK  Free, (TRAVATAN) 0.004 % SOLN ophthalmic solution Place 1 drop into both eyes at bedtime.     No current facility-administered medications for this visit.     Allergies:   Patient has no known allergies.   Social History:  The patient  reports that she has never smoked. She has never used smokeless tobacco. She reports current alcohol use. She reports that she does not use drugs.   Family History:   family history includes Arthritis in her father; Bipolar disorder in her son; Cancer in her mother; Coronary artery disease in her maternal uncle; Diabetes in her father; Glaucoma in her father; Heart disease in her father and sister;  Stroke in her sister.    Review of Systems: Review of Systems  Constitutional: Negative.   HENT: Negative.   Respiratory: Positive for shortness of breath.   Cardiovascular: Negative.        Tightness  Gastrointestinal: Negative.   Musculoskeletal: Negative.   Neurological: Negative.   Psychiatric/Behavioral: Negative.   All other systems reviewed and are negative.   PHYSICAL EXAM: VS:  BP 110/90 (BP Location: Right Arm, Patient Position: Sitting, Cuff Size: Normal)   Pulse 70   Wt 176 lb 6 oz (80 kg)   SpO2 97%   BMI 29.35 kg/m  , BMI Body mass index is 29.35 kg/m. GEN: Well nourished, well developed, in no acute distress HEENT: normal Neck: no JVD, carotid bruits, or masses Cardiac: RRR; no murmurs, rubs, or gallops,no edema  Respiratory:  clear to auscultation bilaterally, normal work of breathing GI: soft, nontender, nondistended, + BS MS: no deformity or atrophy Skin: warm and dry, no rash Neuro:  Strength and sensation are intact Psych: euthymic mood, full affect    Recent Labs: 02/16/2020: ALT 18; BUN 19; Creatinine, Ser 0.73; Hemoglobin 13.5; Platelets 220.0; Potassium 4.5; Sodium 140; TSH 4.25    Lipid Panel Lab Results  Component Value Date   CHOL 193 02/16/2020   HDL 77.30 02/16/2020   LDLCALC 104 (H) 02/16/2020   TRIG 59.0 02/16/2020      Wt Readings from Last 3 Encounters:  06/04/20 176 lb 6 oz (80 kg)  06/03/20 177 lb 8 oz (80.5 kg)  03/15/20 176 lb 14.4 oz (80.2 kg)       ASSESSMENT AND PLAN:  Problem List Items Addressed This Visit      Cardiology Problems   Essential hypertension - Primary     Other   Hypothyroidism    Other Visit Diagnoses    Chest pain, unspecified type       Relevant Orders   EKG 12-Lead   ECHOCARDIOGRAM COMPLETE   NM Myocar Multi W/Spect W/Wall Motion / EF   Shortness of breath       Relevant Orders   EKG 12-Lead   ECHOCARDIOGRAM COMPLETE   NM Myocar Multi W/Spect W/Wall Motion / EF     Chest  discomfort Atypical in nature, few risk factors for coronary disease Unable to treadmill secondary to deconditioning, leg weakness I suspect much of her symptoms is secondary to underlying stress or anxiety though unable to exclude ischemia Lexiscan Myoview has been ordered  Shortness of breath Again suspect secondary to deconditioning, stress though unable to exclude underlying cardiac disease or pathology Echocardiogram has been ordered to rule out pulmonary hypertension, valvular heart disease, cardiomyopathy  Weakness Recommended a regular walking program for conditioning This would also help with her stress level  Disposition:   We will call her with the results of  the testing as above  Long discussion with her concerning various treatment options and work-up available for her shortness of breath and chest discomfort  Total encounter time more than 60 minutes  Greater than 50% was spent in counseling and coordination of care with the patient    Signed, Esmond Plants, M.D., Ph.D. [place], [place]

## 2020-06-04 NOTE — Patient Instructions (Addendum)
Echo for shortness of breath, leg swelling  Myoview for chest tightness, shortness of breath   Medication Instructions:  No changes  If you need a refill on your cardiac medications before your next appointment, please call your pharmacy.    Lab work: No new labs needed   If you have labs (blood work) drawn today and your tests are completely normal, you will receive your results only by:  [place] (if you have MyChart) OR  A paper copy in the mail If you have any lab test that is abnormal or we need to change your treatment, we will call you to review the results.   Testing/Procedures: Your physician has requested that you have an echocardiogram. Echocardiography is a painless test that uses sound waves to create images of your heart. It provides your doctor with information about the size and shape of your heart and how well your hearts chambers and valves are working. This procedure takes approximately one hour. There are no restrictions for this procedure.  [place]  Your caregiver has ordered a Stress Test with nuclear imaging. The purpose of this test is to evaluate the blood supply to your heart muscle. This procedure is referred to as a "Non-Invasive Stress Test." This is because other than having an IV started in your vein, nothing is inserted or "invades" your body. Cardiac stress tests are done to find areas of poor blood flow to the heart by determining the extent of coronary artery disease (CAD). Some patients exercise on a treadmill, which naturally increases the blood flow to your heart, while others who are  unable to walk on a treadmill due to physical limitations have a pharmacologic/chemical stress agent called Lexiscan . This medicine will mimic walking on a treadmill by temporarily increasing your coronary blood flow.   Please note: these test may take anywhere between 2-4 hours to complete  PLEASE REPORT TO [facility]  AT THE FIRST DESK WILL DIRECT YOU WHERE TO GO  Date of Procedure:_____________________________________  Arrival Time for Procedure:______________________________    PLEASE NOTIFY THE OFFICE AT LEAST 24 HOURS IN ADVANCE IF YOU ARE UNABLE TO KEEP YOUR APPOINTMENT.  [phone] AND  PLEASE NOTIFY NUCLEAR MEDICINE AT [facility] AT LEAST 24 HOURS IN ADVANCE IF YOU ARE UNABLE TO KEEP YOUR APPOINTMENT. [phone]  How to prepare for your Myoview test:  1. Do not eat or drink after midnight 2. No caffeine for 24 hours prior to test 3. No smoking 24 hours prior to test. 4. Your medication may be taken with water.  If your doctor stopped a medication because of this test, do not take that medication. 5. Ladies, please do not wear dresses.  Skirts or pants are appropriate. Please wear a short sleeve shirt. 6. No perfume, cologne or lotion. 7. Wear comfortable walking shoes. No heels!   Follow-Up: At [facility], you and your health needs are our priority.  As part of our continuing mission to provide you with exceptional heart care, we have created designated Provider Care Teams.  These Care Teams include your primary Cardiologist (physician) and Advanced Practice Providers (APPs -  Physician Assistants and Nurse Practitioners) who all work together to provide you with the care you need, when you need it.   You will need a follow up appointment as needed    Providers on your designated Care Team:    Murray Hodgkins, NP  Christell Faith, PA-C  Marrianne Mood, PA-C  Any Other  Special Instructions Will Be Listed Below (If Applicable).  For educational health videos Log in to : [email or web address] Or : [email or web address], password : [email or web address]

## 2020-06-05 ENCOUNTER — Ambulatory Visit (INDEPENDENT_AMBULATORY_CARE_PROVIDER_SITE_OTHER): Payer: Medicare Other | Admitting: Family Medicine

## 2020-06-05 ENCOUNTER — Other Ambulatory Visit: Payer: Self-pay

## 2020-06-05 ENCOUNTER — Encounter: Payer: Self-pay | Admitting: Family Medicine

## 2020-06-05 VITALS — BP 136/80 | HR 88 | Ht 63.0 in | Wt 176.0 lb

## 2020-06-05 DIAGNOSIS — M85851 Other specified disorders of bone density and structure, right thigh: Secondary | ICD-10-CM | POA: Diagnosis not present

## 2020-06-05 DIAGNOSIS — E559 Vitamin D deficiency, unspecified: Secondary | ICD-10-CM

## 2020-06-05 DIAGNOSIS — Z639 Problem related to primary support group, unspecified: Secondary | ICD-10-CM | POA: Diagnosis not present

## 2020-06-05 DIAGNOSIS — H409 Unspecified glaucoma: Secondary | ICD-10-CM

## 2020-06-05 DIAGNOSIS — R0989 Other specified symptoms and signs involving the circulatory and respiratory systems: Secondary | ICD-10-CM | POA: Insufficient documentation

## 2020-06-05 DIAGNOSIS — I10 Essential (primary) hypertension: Secondary | ICD-10-CM

## 2020-06-05 DIAGNOSIS — Z7189 Other specified counseling: Secondary | ICD-10-CM

## 2020-06-05 DIAGNOSIS — I872 Venous insufficiency (chronic) (peripheral): Secondary | ICD-10-CM | POA: Diagnosis not present

## 2020-06-05 DIAGNOSIS — R0789 Other chest pain: Secondary | ICD-10-CM

## 2020-06-05 DIAGNOSIS — E039 Hypothyroidism, unspecified: Secondary | ICD-10-CM | POA: Diagnosis not present

## 2020-06-05 MED ORDER — LOSARTAN POTASSIUM 100 MG PO TABS: 100.0000 mg | ORAL_TABLET | Freq: Every day | ORAL | 3 refills | Status: DC

## 2020-06-05 MED ORDER — LEVOTHYROXINE SODIUM 75 MCG PO TABS: 75.0000 ug | ORAL_TABLET | Freq: Every day | ORAL | 3 refills | Status: DC

## 2020-06-05 NOTE — Assessment & Plan Note (Signed)
Chronic, stable on current regimen.  

## 2020-06-05 NOTE — Assessment & Plan Note (Signed)
repleting levels on 2000 IU daily.

## 2020-06-05 NOTE — Assessment & Plan Note (Signed)
Appreciate cards care. 

## 2020-06-05 NOTE — Patient Instructions (Addendum)
Call to schedule mammogram at your convenience: [facility] [phone] If interested, check with pharmacy about new 2 shot shingles series (shingrix).  La remitiremos para sonograma de arterias de cuello Advanced directives provided today.  Gusto verla hoy. Regresar en 6 meses para proxima visita.   Health Maintenance After Age 83 After age 42, you are at a higher risk for certain long-term diseases and infections as well as injuries from falls. Falls are a major cause of broken bones and head injuries in people who are older than age 16. Getting regular preventive care can help to keep you healthy and well. Preventive care includes getting regular testing and making lifestyle changes as recommended by your health care provider. Talk with your health care provider about:  Which screenings and tests you should have. A screening is a test that checks for a disease when you have no symptoms.  A diet and exercise plan that is right for you. What should I know about screenings and tests to prevent falls? Screening and testing are the best ways to find a health problem early. Early diagnosis and treatment give you the best chance of managing medical conditions that are common after age 4. Certain conditions and lifestyle choices may make you more likely to have a fall. Your health care provider may recommend:  Regular vision checks. Poor vision and conditions such as cataracts can make you more likely to have a fall. If you wear glasses, make sure to get your prescription updated if your vision changes.  Medicine review. Work with your health care provider to regularly review all of the medicines you are taking, including over-the-counter medicines. Ask your health care provider about any side effects that may make you more likely to have a fall. Tell your health care provider if any medicines that you take make you feel dizzy or sleepy.  Osteoporosis screening. Osteoporosis is  a condition that causes the bones to get weaker. This can make the bones weak and cause them to break more easily.  Blood pressure screening. Blood pressure changes and medicines to control blood pressure can make you feel dizzy.  Strength and balance checks. Your health care provider may recommend certain tests to check your strength and balance while standing, walking, or changing positions.  Foot health exam. Foot pain and numbness, as well as not wearing proper footwear, can make you more likely to have a fall.  Depression screening. You may be more likely to have a fall if you have a fear of falling, feel emotionally low, or feel unable to do activities that you used to do.  Alcohol use screening. Using too much alcohol can affect your balance and may make you more likely to have a fall. What actions can I take to lower my risk of falls? General instructions  Talk with your health care provider about your risks for falling. Tell your health care provider if: ? You fall. Be sure to tell your health care provider about all falls, even ones that seem minor. ? You feel dizzy, sleepy, or off-balance.  Take over-the-counter and prescription medicines only as told by your health care provider. These include any supplements.  Eat a healthy diet and maintain a healthy weight. A healthy diet includes low-fat dairy products, low-fat (lean) meats, and fiber from whole grains, beans, and lots of fruits and vegetables. Home safety  Remove any tripping hazards, such as rugs, cords, and clutter.  Install safety equipment such as grab  bars in bathrooms and safety rails on stairs.  Keep rooms and walkways well-lit. Activity   Follow a regular exercise program to stay fit. This will help you maintain your balance. Ask your health care provider what types of exercise are appropriate for you.  If you need a cane or walker, use it as recommended by your health care provider.  Wear supportive shoes  that have nonskid soles. Lifestyle  Do not drink alcohol if your health care provider tells you not to drink.  If you drink alcohol, limit how much you have: ? 0-1 drink a day for women. ? 0-2 drinks a day for men.  Be aware of how much alcohol is in your drink. In the [place] one drink equals one typical bottle of beer (12 oz), one-half glass of wine (5 oz), or one shot of hard liquor (1 oz).  Do not use any products that contain nicotine or tobacco, such as cigarettes and e-cigarettes. If you need help quitting, ask your health care provider. Summary  Having a healthy lifestyle and getting preventive care can help to protect your health and wellness after age 35.  Screening and testing are the best way to find a health problem early and help you avoid having a fall. Early diagnosis and treatment give you the best chance for managing medical conditions that are more common for people who are older than age 35.  Falls are a major cause of broken bones and head injuries in people who are older than age 15. Take precautions to prevent a fall at home.  Work with your health care provider to learn what changes you can make to improve your health and wellness and to prevent falls. This information is not intended to replace advice given to you by your health care provider. Make sure you discuss any questions you have with your health care provider. Document Revised: 04/06/2019 Document Reviewed: 10/27/2017 Elsevier Patient Education  2020 [employer].

## 2020-06-05 NOTE — Assessment & Plan Note (Signed)
Sees ophtho, on travoprost drops.

## 2020-06-05 NOTE — Assessment & Plan Note (Signed)
Stressful period with bipolar son who recently moved in.

## 2020-06-05 NOTE — Assessment & Plan Note (Signed)
Stable period - encouraged continue calcium/vit D intake and regular weight bearing exercise.

## 2020-06-05 NOTE — Assessment & Plan Note (Signed)
Chronic, stable. Continue current regimen. 

## 2020-06-05 NOTE — Progress Notes (Signed)
This visit was conducted in person.  BP 136/80   Pulse 88   Ht 5\' 3"  (1.6 m) Comment: without shoes  Wt 176 lb (79.8 kg)   SpO2 97%   BMI 31.18 kg/m    CC: AMW f/u Subjective:    Patient ID: Beth Rodgers, female    DOB: July 28, 1935, 85 y.o.   MRN: [id number]  HPI: Beth Rodgers is a 85 y.o. female presenting on 06/05/2020 for Annual Exam   Saw health advisor last week for medicare wellness visit. Note reviewed.    No exam data present    Clinical Support from 05/31/2020 in [place] at [facility] Total Score  0      Fall Risk  05/31/2020 05/26/2019 03/09/2018 02/19/2017 10/23/2016  Falls in the past year? 0 1 No No No  Comment - fell in hospital - - -  Number falls in past yr: 0 0 - - -  Injury with Fall? 0 1 - - -  Risk for fall due to : Medication side effect - - - -  Follow up Falls evaluation completed;Falls prevention discussed - - - -      Saw cards Dr Rockey Situ for recent chest pressure, likely related to stress. Has upcoming lexiscan myoview and echocardiogram. Very happy with evaluation.   By the way - notes pulling sensation to right anterior chest at base of neck for the past 1 month.   Preventative: Declined colonoscopy, stool kits have been normal to date. has aged out.  Mammogram at [facility]12/2016 - Birads1. Declines repeat. Advised to call [place].  Well woman with Karoline Caldwell- has not recently seen. Denies pelvic pain, pressure, or vaginal bleeding or skin changes.  DEXA Date: 06/2009 T score Spine: -1.8, hip -2.0.  DEXA 11/2015 persistent osteopenia T -2.3.  Fosamax/evista has been on hold. DEXA 10/2019 T -2.3 hip, spine -2.2 Flu shot -yearly  Td 04/2012  COVID vaccine - completed [place] 01/2020 Pneumovax 10/2012. prevnar 2015  zostavax 06/2014  shingrix - discussed - will check with pharmacy  Advanced directives: has packet at home, needs to work on this. Would want husband to be HCPOA.  Seat belt use discussed Sunscreen use discussed.  Denies changing moles.Sees derm regularly Non smoker  Alcohol - seldom  Dentist in [place]. Uses night guard regularly.  Eye exam Q6 mo Bowel - no constipation - manages with prunes  Bladder - no incontinence   Caffeine: 2-3 cups/day  Lives with husband, majority of time alone as he travels to [place]  Has grandchildren nearby  Occ: Field seismologist, Metallurgist  Activity: bed exercises, limitedwalking  Diet: does get fruits and vegetables, only some water     Relevant past medical, surgical, family and social history reviewed and updated as indicated. Interim medical history since our last visit reviewed. Allergies and medications reviewed and updated. Outpatient Medications Prior to Visit  Medication Sig Dispense Refill  . Ascorbic Acid (VITAMIN C CR PO) Take by mouth as directed. Reported on 12/17/2015    . CALCIUM CARBONATE PO Take 600 mg by mouth as directed. Reported on 12/17/2015    . Cholecalciferol (VITAMIN D) 50 MCG (2000 UT) CAPS Take 1 capsule (2,000 Units total) by mouth daily. 30 capsule   . CICLOPIROX EX Apply topically as needed. Reported on 12/17/2015    . desonide (DESOWEN) 0.05 % cream APPLY TO AFFECTED AREA TWICE A DAY 30 g 1  . diclofenac sodium (VOLTAREN) 1 % GEL Apply 1  application topically 3 (three) times daily. 1 Tube 0  . halobetasol (ULTRAVATE) 0.05 % ointment APPLY TO AFFECTED AREA TWICE A DAY  1  . Multiple Vitamin (MULTIVITAMIN) tablet Take 1 tablet by mouth as needed. Reported on 12/17/2015    . NITROSTAT 0.4 MG SL tablet every 5 (five) minutes as needed. Reported on 04/16/2016    . Travoprost, BAK Free, (TRAVATAN) 0.004 % SOLN ophthalmic solution Place 1 drop into both eyes at bedtime.    Marland Kitchen levothyroxine (SYNTHROID) 75 MCG tablet Take 1 tablet (75 mcg total) by mouth daily. 90 tablet 1  . losartan (COZAAR) 100 MG tablet TAKE 1 TABLET BY MOUTH EVERY DAY 90 tablet 1   No facility-administered medications prior to visit.     Per HPI unless  specifically indicated in ROS section below Review of Systems Objective:  BP 136/80   Pulse 88   Ht 5\' 3"  (1.6 m) Comment: without shoes  Wt 176 lb (79.8 kg)   SpO2 97%   BMI 31.18 kg/m   Wt Readings from Last 3 Encounters:  06/05/20 176 lb (79.8 kg)  06/04/20 176 lb 6 oz (80 kg)  06/03/20 177 lb 8 oz (80.5 kg)      Physical Exam Vitals and nursing note reviewed.  Constitutional:      General: She is not in acute distress.    Appearance: Normal appearance. She is well-developed. She is not ill-appearing.  HENT:     Head: Normocephalic and atraumatic.     Right Ear: Hearing, tympanic membrane, ear canal and external ear normal.     Left Ear: Hearing, tympanic membrane, ear canal and external ear normal.  Eyes:     General: No scleral icterus.    Extraocular Movements: Extraocular movements intact.     Conjunctiva/sclera: Conjunctivae normal.     Pupils: Pupils are equal, round, and reactive to light.  Neck:     Thyroid: No thyromegaly or thyroid tenderness.     Vascular: JVD present. No carotid bruit.      Comments: Pulsatile common carotid artery on right with bruit heard Cardiovascular:     Rate and Rhythm: Normal rate and regular rhythm.     Pulses: Normal pulses.          Radial pulses are 2+ on the right side and 2+ on the left side.     Heart sounds: Normal heart sounds. No murmur.  Pulmonary:     Effort: Pulmonary effort is normal. No respiratory distress.     Breath sounds: Normal breath sounds. No wheezing, rhonchi or rales.  Abdominal:     General: Abdomen is flat. Bowel sounds are normal. There is no distension.     Palpations: Abdomen is soft. There is no mass.     Tenderness: There is no abdominal tenderness. There is no guarding or rebound.     Hernia: No hernia is present.  Musculoskeletal:        General: Normal range of motion.     Cervical back: Normal range of motion and neck supple.     Right lower leg: Edema (tr) present.     Left lower leg:  Edema (tr) present.  Lymphadenopathy:     Cervical: No cervical adenopathy.  Skin:    General: Skin is warm and dry.     Findings: No rash.  Neurological:     General: No focal deficit present.     Mental Status: She is alert and oriented to person, place, and time.  Comments: CN grossly intact, station and gait intact  Psychiatric:        Mood and Affect: Mood normal.        Behavior: Behavior normal.        Thought Content: Thought content normal.        Judgment: Judgment normal.       Results for orders placed or performed in visit on 02/16/20  T4, free  Result Value Ref Range   Free T4 1.07 0.60 - 1.60 ng/dL  VITAMIN D 25 Hydroxy (Vit-D Deficiency, Fractures)  Result Value Ref Range   VITD 50.58 30.00 - 100.00 ng/mL  Vitamin B12  Result Value Ref Range   Vitamin B-12 682 211 - 911 pg/mL  CBC with Differential/Platelet  Result Value Ref Range   WBC 7.2 4.0 - 10.5 K/uL   RBC 4.68 3.87 - 5.11 Mil/uL   Hemoglobin 13.5 12.0 - 15.0 g/dL   HCT 41.0 36.0 - 46.0 %   MCV 87.5 78.0 - 100.0 fl   MCHC 33.0 30.0 - 36.0 g/dL   RDW 14.4 11.5 - 15.5 %   Platelets 220.0 150.0 - 400.0 K/uL   Neutrophils Relative % 66.4 43.0 - 77.0 %   Lymphocytes Relative 18.7 12.0 - 46.0 %   Monocytes Relative 12.9 (H) 3.0 - 12.0 %   Eosinophils Relative 1.7 0.0 - 5.0 %   Basophils Relative 0.3 0.0 - 3.0 %   Neutro Abs 4.8 1.4 - 7.7 K/uL   Lymphs Abs 1.3 0.7 - 4.0 K/uL   Monocytes Absolute 0.9 0.1 - 1.0 K/uL   Eosinophils Absolute 0.1 0.0 - 0.7 K/uL   Basophils Absolute 0.0 0.0 - 0.1 K/uL  TSH  Result Value Ref Range   TSH 4.25 0.35 - 4.50 uIU/mL  Comprehensive metabolic panel  Result Value Ref Range   Sodium 140 135 - 145 mEq/L   Potassium 4.5 3.5 - 5.1 mEq/L   Chloride 104 96 - 112 mEq/L   CO2 29 19 - 32 mEq/L   Glucose, Bld 82 70 - 99 mg/dL   BUN 19 6 - 23 mg/dL   Creatinine, Ser 0.73 0.40 - 1.20 mg/dL   Total Bilirubin 0.6 0.2 - 1.2 mg/dL   Alkaline Phosphatase 87 39 - 117 U/L     AST 18 0 - 37 U/L   ALT 18 0 - 35 U/L   Total Protein 6.7 6.0 - 8.3 g/dL   Albumin 4.1 3.5 - 5.2 g/dL   GFR 76.18 >60.00 mL/min   Calcium 9.0 8.4 - 10.5 mg/dL  Lipid panel  Result Value Ref Range   Cholesterol 193 0 - 200 mg/dL   Triglycerides 59.0 0.0 - 149.0 mg/dL   HDL 77.30 >39.00 mg/dL   VLDL 11.8 0.0 - 40.0 mg/dL   LDL Cholesterol 104 (H) 0 - 99 mg/dL   Total CHOL/HDL Ratio 2    NonHDL 115.61    Assessment & Plan:  This visit occurred during the SARS-CoV-2 public health emergency.  Safety protocols were in place, including screening questions prior to the visit, additional usage of staff PPE, and extensive cleaning of exam room while observing appropriate contact time as indicated for disinfecting solutions.   Problem List Items Addressed This Visit    Vitamin D deficiency    repleting levels on 2000 IU daily.      Right carotid bruit    Pulsatile mass to anterior upper right chest at base of neck with bruit anticipate common carotid artery -  check carotid US       Relevant Orders   VAS US CAROTID   Osteopenia    Stable period - encouraged continue calcium/vit D intake and regular weight bearing exercise.       Hypothyroidism    Chronic, stable. Continue current regimen.       Relevant Medications   levothyroxine (SYNTHROID) 75 MCG tablet   Glaucoma    Sees ophtho, on travoprost drops.       Essential hypertension    Chronic, stable on current regimen.       Relevant Medications   losartan (COZAAR) 100 MG tablet   Difficulty with family    Stressful period with bipolar son who recently moved in.       Chronic venous insufficiency   Relevant Medications   losartan (COZAAR) 100 MG tablet   Chest pressure    Appreciate cards care.       Advanced care planning/counseling discussion - Primary    Advanced directives: has packet at home, needs to work on this. Would want husband to be HCPOA. New packet provided today.           Meds ordered this  encounter  Medications  . levothyroxine (SYNTHROID) 75 MCG tablet    Sig: Take 1 tablet (75 mcg total) by mouth daily.    Dispense:  90 tablet    Refill:  3  . losartan (COZAAR) 100 MG tablet    Sig: Take 1 tablet (100 mg total) by mouth daily.    Dispense:  90 tablet    Refill:  3   No orders of the defined types were placed in this encounter.   Patient instructions: Call to schedule mammogram at your convenience: [facility] [phone] If interested, check with pharmacy about new 2 shot shingles series (shingrix).  La remitiremos para sonograma de arterias de cuello Advanced directives provided today.  Gusto verla hoy. Regresar en 6 meses para proxima visita.   Follow up plan: Return in about 6 months (around 12/05/2020) for follow up visit.  Ria Bush, MD

## 2020-06-05 NOTE — Assessment & Plan Note (Signed)
Pulsatile mass to anterior upper right chest at base of neck with bruit anticipate common carotid artery - check carotid US

## 2020-06-05 NOTE — Assessment & Plan Note (Addendum)
Advanced directives: has packet at home, needs to work on this. Would want husband to be HCPOA. New packet provided today.

## 2020-06-06 ENCOUNTER — Other Ambulatory Visit: Payer: Self-pay

## 2020-06-06 ENCOUNTER — Telehealth: Payer: Self-pay

## 2020-06-06 MED ORDER — FLUTICASONE PROPIONATE 0.05 % EX CREA: TOPICAL_CREAM | Freq: Every day | CUTANEOUS | 0 refills | Status: DC

## 2020-06-06 NOTE — Telephone Encounter (Signed)
Patient aware rx cutivate is ready for pick up.

## 2020-06-06 NOTE — Telephone Encounter (Signed)
Spoke to pt. She will get that is it is covered by insurance. If not, I looked it up and she can get the meds with a Good Rx card for less than $20.

## 2020-06-06 NOTE — Telephone Encounter (Signed)
Ok to try cutivate (fluticasone) cream in place of desonide.

## 2020-06-06 NOTE — Telephone Encounter (Signed)
Pt left v/m that [facility] contacted pt that Desonide cream was denied by insurance until gets more info why Desonide cream is needed. ?PA. Pt said due to her ears she cannot take meds with more cortisone and that is why Desonide cream which has less cortisone than some other meds was prescribed.

## 2020-06-06 NOTE — Telephone Encounter (Signed)
Confirmed with pharmacy and they received the cutivate rx and it will be $12.

## 2020-06-06 NOTE — Addendum Note (Signed)
Addended by: Ria Bush on: 06/06/2020 02:22 PM   Modules accepted: Orders

## 2020-06-06 NOTE — Telephone Encounter (Signed)
Please phone in due to E prescribing error.  

## 2020-06-11 ENCOUNTER — Ambulatory Visit (INDEPENDENT_AMBULATORY_CARE_PROVIDER_SITE_OTHER): Payer: Medicare Other | Admitting: Psychology

## 2020-06-11 DIAGNOSIS — F4323 Adjustment disorder with mixed anxiety and depressed mood: Secondary | ICD-10-CM | POA: Diagnosis not present

## 2020-06-13 ENCOUNTER — Encounter
Admission: RE | Admit: 2020-06-13 | Discharge: 2020-06-13 | Disposition: A | Discharge status: Home / Self Care | Payer: Medicare Other | Source: Ambulatory Visit | Attending: Cardiovascular Disease | Admitting: Cardiovascular Disease

## 2020-06-13 ENCOUNTER — Other Ambulatory Visit: Payer: Self-pay

## 2020-06-13 DIAGNOSIS — R0602 Shortness of breath: Secondary | ICD-10-CM

## 2020-06-13 DIAGNOSIS — R079 Chest pain, unspecified: Secondary | ICD-10-CM | POA: Insufficient documentation

## 2020-06-13 MED ORDER — TECHNETIUM TC 99M TETROFOSMIN IV KIT
10.0000 | PACK | Freq: Once | INTRAVENOUS | Status: AC | PRN
  Administered 2020-06-13: 10.1 via INTRAVENOUS

## 2020-06-13 MED ORDER — TECHNETIUM TC 99M TETROFOSMIN IV KIT
32.3380 | PACK | Freq: Once | INTRAVENOUS | Status: AC | PRN
  Administered 2020-06-13: 32.338 via INTRAVENOUS

## 2020-06-13 MED ORDER — REGADENOSON 0.4 MG/5ML IV SOLN
0.4000 mg | Freq: Once | INTRAVENOUS | Status: AC
  Administered 2020-06-13: 0.4 mg via INTRAVENOUS
  Filled 2020-06-13: qty 5

## 2020-06-14 LAB — NM MYOCAR MULTI W/SPECT W/WALL MOTION / EF
Estimated workload: 1 METS
Exercise duration (min): 0 min
Exercise duration (sec): 0 s
LV dias vol: 58 mL (ref 46–106)
LV sys vol: 18 mL
MPHR: 137 {beats}/min
Peak HR: 81 {beats}/min
Percent HR: 59 %
Rest HR: 62 {beats}/min
SDS: 3
SRS: 1
SSS: 2
TID: 0.9

## 2020-06-25 ENCOUNTER — Ambulatory Visit (INDEPENDENT_AMBULATORY_CARE_PROVIDER_SITE_OTHER): Payer: Medicare Other | Admitting: Psychology

## 2020-06-25 DIAGNOSIS — F4323 Adjustment disorder with mixed anxiety and depressed mood: Secondary | ICD-10-CM | POA: Diagnosis not present

## 2020-07-09 ENCOUNTER — Ambulatory Visit (INDEPENDENT_AMBULATORY_CARE_PROVIDER_SITE_OTHER): Payer: Medicare Other | Admitting: Psychology

## 2020-07-09 DIAGNOSIS — F4323 Adjustment disorder with mixed anxiety and depressed mood: Secondary | ICD-10-CM

## 2020-07-11 ENCOUNTER — Ambulatory Visit (INDEPENDENT_AMBULATORY_CARE_PROVIDER_SITE_OTHER): Payer: Medicare Other

## 2020-07-11 ENCOUNTER — Other Ambulatory Visit: Payer: Self-pay

## 2020-07-11 DIAGNOSIS — R079 Chest pain, unspecified: Secondary | ICD-10-CM

## 2020-07-11 DIAGNOSIS — R0602 Shortness of breath: Secondary | ICD-10-CM

## 2020-07-11 LAB — ECHOCARDIOGRAM COMPLETE
AR max vel: 2.55 cm2
AV Area VTI: 2.12 cm2
AV Area mean vel: 2.27 cm2
AV Mean grad: 6 mmHg
AV Peak grad: 12.3 mmHg
Ao pk vel: 1.75 m/s
Area-P 1/2: 3.5 cm2
Calc EF: 68 %
P 1/2 time: 417 msec
Single Plane A2C EF: 71.2 %
Single Plane A4C EF: 63 %

## 2020-07-16 DIAGNOSIS — H401123 Primary open-angle glaucoma, left eye, severe stage: Secondary | ICD-10-CM | POA: Diagnosis not present

## 2020-07-17 ENCOUNTER — Other Ambulatory Visit: Payer: Self-pay

## 2020-07-17 ENCOUNTER — Ambulatory Visit (INDEPENDENT_AMBULATORY_CARE_PROVIDER_SITE_OTHER): Payer: Medicare Other

## 2020-07-17 DIAGNOSIS — R0989 Other specified symptoms and signs involving the circulatory and respiratory systems: Secondary | ICD-10-CM

## 2020-07-23 ENCOUNTER — Ambulatory Visit (INDEPENDENT_AMBULATORY_CARE_PROVIDER_SITE_OTHER): Payer: Medicare Other | Admitting: Psychology

## 2020-07-23 DIAGNOSIS — F4323 Adjustment disorder with mixed anxiety and depressed mood: Secondary | ICD-10-CM

## 2020-07-25 DIAGNOSIS — L400 Psoriasis vulgaris: Secondary | ICD-10-CM | POA: Diagnosis not present

## 2020-07-25 DIAGNOSIS — B351 Tinea unguium: Secondary | ICD-10-CM | POA: Diagnosis not present

## 2020-07-25 DIAGNOSIS — L986 Other infiltrative disorders of the skin and subcutaneous tissue: Secondary | ICD-10-CM | POA: Diagnosis not present

## 2020-07-25 DIAGNOSIS — L821 Other seborrheic keratosis: Secondary | ICD-10-CM | POA: Diagnosis not present

## 2020-07-25 DIAGNOSIS — D2261 Melanocytic nevi of right upper limb, including shoulder: Secondary | ICD-10-CM | POA: Diagnosis not present

## 2020-07-25 DIAGNOSIS — I8312 Varicose veins of left lower extremity with inflammation: Secondary | ICD-10-CM | POA: Diagnosis not present

## 2020-07-25 DIAGNOSIS — M793 Panniculitis, unspecified: Secondary | ICD-10-CM | POA: Diagnosis not present

## 2020-07-25 DIAGNOSIS — D485 Neoplasm of uncertain behavior of skin: Secondary | ICD-10-CM | POA: Diagnosis not present

## 2020-07-25 DIAGNOSIS — D1801 Hemangioma of skin and subcutaneous tissue: Secondary | ICD-10-CM | POA: Diagnosis not present

## 2020-07-25 DIAGNOSIS — I872 Venous insufficiency (chronic) (peripheral): Secondary | ICD-10-CM | POA: Diagnosis not present

## 2020-07-25 DIAGNOSIS — I8311 Varicose veins of right lower extremity with inflammation: Secondary | ICD-10-CM | POA: Diagnosis not present

## 2020-07-25 DIAGNOSIS — B353 Tinea pedis: Secondary | ICD-10-CM | POA: Diagnosis not present

## 2020-07-25 DIAGNOSIS — D225 Melanocytic nevi of trunk: Secondary | ICD-10-CM | POA: Diagnosis not present

## 2020-07-30 ENCOUNTER — Telehealth: Payer: Self-pay

## 2020-07-30 ENCOUNTER — Other Ambulatory Visit: Payer: Self-pay | Admitting: Family Medicine

## 2020-07-30 DIAGNOSIS — Z1231 Encounter for screening mammogram for malignant neoplasm of breast: Secondary | ICD-10-CM

## 2020-07-30 NOTE — Telephone Encounter (Signed)
Pt left v/m that pt having dull pain under breast on lt side that is continuous for 2 days; pain level now is 3 -4; no radiation of pain to neck,jaw or arm. no known injury; I spoke with pt and pain started 2 days ago. Previously pt had fx ribs and this pain is similar. Pt said area feels bruised but no bruise seen. Pt has no covid symptoms, no travel and no known exposure to + covid. Pt had pfizer vaccine on 01/28/20 and 02/20/20. No respiratory symptoms, no cough or SOB. Pt feels like someone punched her and area is bruised but no bruise seen. Pt requesting appt on 08/02/20 at 10:15. Pt has been taking ASA which helps somewhat. Pt is not sure what mg ASA pt is taking. Pt wants to know if there is something else pt should take for discomfort until seen on 08/02/20. UC & ED precautions given and pt voiced understanding. Pt request cb after Dr Darnell Level reviews this note.[facility].

## 2020-07-30 NOTE — Telephone Encounter (Signed)
Will see at [facility].  Recommend try tylenol 500mg  up to three times a day for discomfort, may also use cool or warm compresses - whichever soothes more.

## 2020-07-30 NOTE — Telephone Encounter (Signed)
Pt returning call.  Relayed Dr. G's message.  Pt verbalizes understanding.  

## 2020-07-30 NOTE — Telephone Encounter (Signed)
Lvm asking pt to call back.  Need to relay Dr. G's message.  

## 2020-07-31 DIAGNOSIS — H401123 Primary open-angle glaucoma, left eye, severe stage: Secondary | ICD-10-CM | POA: Diagnosis not present

## 2020-08-01 ENCOUNTER — Encounter: Payer: Self-pay | Admitting: Family Medicine

## 2020-08-01 ENCOUNTER — Other Ambulatory Visit: Payer: Self-pay

## 2020-08-01 ENCOUNTER — Ambulatory Visit (INDEPENDENT_AMBULATORY_CARE_PROVIDER_SITE_OTHER): Payer: Medicare Other | Admitting: Family Medicine

## 2020-08-01 VITALS — BP 124/74 | HR 81 | Temp 97.6°F | Ht 63.0 in | Wt 175.2 lb

## 2020-08-01 DIAGNOSIS — C50912 Malignant neoplasm of unspecified site of left female breast: Secondary | ICD-10-CM | POA: Insufficient documentation

## 2020-08-01 DIAGNOSIS — N644 Mastodynia: Secondary | ICD-10-CM | POA: Diagnosis not present

## 2020-08-01 NOTE — Progress Notes (Signed)
This visit was conducted in person.  BP 124/74 (BP Location: Right Arm, Patient Position: Sitting, Cuff Size: Normal)   Pulse 81   Temp 97.6 F (36.4 C) (Temporal)   Ht 5\' 3"  (1.6 m)   Wt 175 lb 4 oz (79.5 kg)   SpO2 97%   BMI 31.04 kg/m    CC: L breast pain Subjective:    Patient ID: Beth Rodgers, female    DOB: 1935-09-16, 85 y.o.   MRN: [id number]  HPI: CYNCERE RUHE is a 85 y.o. female presenting on 08/01/2020 for Breast Pain (C/o left breast pain at base of left breast radiating around to underarm. Feels better this morning.  Thinks it is muscle related.  Started about 4 days ago.  Tried Tylenol.   Per pt, last mammogram- 2016.)   4d h/o L chest wall discomfort below breast with radiation to back. Treated with tylenol and massage with benefit. Dull reproducible ache - felt similar to prior rib fracture. Today actually feeling much better.  Doesn't remember inciting trauma/injury.   No chest pain/tightness, dyspnea or cough.  Overdue for mammogram - last done 11/2015.       Relevant past medical, surgical, family and social history reviewed and updated as indicated. Interim medical history since our last visit reviewed. Allergies and medications reviewed and updated. Outpatient Medications Prior to Visit  Medication Sig Dispense Refill  . Ascorbic Acid (VITAMIN C CR PO) Take by mouth as directed. Reported on 12/17/2015    . CALCIUM CARBONATE PO Take 600 mg by mouth as directed. Reported on 12/17/2015    . Cholecalciferol (VITAMIN D) 50 MCG (2000 UT) CAPS Take 1 capsule (2,000 Units total) by mouth daily. 30 capsule   . CICLOPIROX EX Apply topically as needed. Reported on 12/17/2015    . diclofenac sodium (VOLTAREN) 1 % GEL Apply 1 application topically 3 (three) times daily. 1 Tube 0  . fluticasone (CUTIVATE) 0.05 % cream Apply topically daily. For max 2 wks at a time 30 g 0  . halobetasol (ULTRAVATE) 0.05 % ointment APPLY TO AFFECTED AREA TWICE A DAY  1  . levothyroxine  (SYNTHROID) 75 MCG tablet Take 1 tablet (75 mcg total) by mouth daily. 90 tablet 3  . losartan (COZAAR) 100 MG tablet Take 1 tablet (100 mg total) by mouth daily. 90 tablet 3  . Multiple Vitamin (MULTIVITAMIN) tablet Take 1 tablet by mouth as needed. Reported on 12/17/2015    . NITROSTAT 0.4 MG SL tablet every 5 (five) minutes as needed. Reported on 04/16/2016    . Travoprost, BAK Free, (TRAVATAN) 0.004 % SOLN ophthalmic solution Place 1 drop into both eyes at bedtime.     No facility-administered medications prior to visit.     Per HPI unless specifically indicated in ROS section below Review of Systems Objective:  BP 124/74 (BP Location: Right Arm, Patient Position: Sitting, Cuff Size: Normal)   Pulse 81   Temp 97.6 F (36.4 C) (Temporal)   Ht 5\' 3"  (1.6 m)   Wt 175 lb 4 oz (79.5 kg)   SpO2 97%   BMI 31.04 kg/m   Wt Readings from Last 3 Encounters:  08/01/20 175 lb 4 oz (79.5 kg)  06/05/20 176 lb (79.8 kg)  06/04/20 176 lb 6 oz (80 kg)      Physical Exam Vitals and nursing note reviewed.  Constitutional:      Appearance: Normal appearance. She is not ill-appearing.  Chest:     Breasts:  Right: Normal. No swelling, bleeding, inverted nipple, mass, nipple discharge, skin change or tenderness.        Left: Mass (small tender nodule L breast at 6 o clock position) and tenderness present. No swelling, bleeding, inverted nipple, nipple discharge or skin change.  Lymphadenopathy:     Upper Body:     Right upper body: No supraclavicular, axillary or pectoral adenopathy.     Left upper body: No supraclavicular, axillary or pectoral adenopathy.  Neurological:     Mental Status: She is alert.       Assessment & Plan:  This visit occurred during the SARS-CoV-2 public health emergency.  Safety protocols were in place, including screening questions prior to the visit, additional usage of staff PPE, and extensive cleaning of exam room while observing appropriate contact time as  indicated for disinfecting solutions.   Problem List Items Addressed This Visit    Breast pain, left - Primary    Exam with small tender nodule to L breast at 6 oclock position - will order dx mammo/ L [place] as overdue. Discussed supportive care PRN.       Relevant Orders   MM DIAG BREAST TOMO BILATERAL   US BREAST LTD UNI LEFT INC AXILLA       No orders of the defined types were placed in this encounter.  Orders Placed This Encounter  Procedures  . MM DIAG BREAST TOMO BILATERAL    Standing Status:   Future    Standing Expiration Date:   08/01/2021    Order Specific Question:   Reason for Exam (SYMPTOM  OR DIAGNOSIS REQUIRED)    Answer:   L breast pain with tender nodule at 6 o clock    Order Specific Question:   Preferred imaging location?    Answer:   [facility]  . US BREAST LTD UNI LEFT INC AXILLA    Standing Status:   Future    Standing Expiration Date:   08/01/2021    Order Specific Question:   Reason for Exam (SYMPTOM  OR DIAGNOSIS REQUIRED)    Answer:   L breast pain with tender nodule at 6 o clock    Order Specific Question:   Preferred imaging location?    Answer:   [facility]    Follow up plan: No follow-ups on file.  Ria Bush, MD

## 2020-08-01 NOTE — Assessment & Plan Note (Signed)
Exam with small tender nodule to L breast at 6 oclock position - will order dx mammo/ L [place] as overdue. Discussed supportive care PRN.

## 2020-08-01 NOTE — Patient Instructions (Signed)
Le haremos cita para mamograma y sonograma si se necesita - ambos en [facility] Gusto verla hoy!

## 2020-08-02 ENCOUNTER — Ambulatory Visit: Payer: Medicare Other | Admitting: Family Medicine

## 2020-08-02 NOTE — Addendum Note (Signed)
Addended by: Virl Cagey on: 08/02/2020 03:04 PM   Modules accepted: Orders

## 2020-08-06 ENCOUNTER — Ambulatory Visit (INDEPENDENT_AMBULATORY_CARE_PROVIDER_SITE_OTHER): Payer: Medicare Other | Admitting: Psychology

## 2020-08-06 ENCOUNTER — Telehealth: Payer: Self-pay

## 2020-08-06 DIAGNOSIS — F4323 Adjustment disorder with mixed anxiety and depressed mood: Secondary | ICD-10-CM | POA: Diagnosis not present

## 2020-08-06 NOTE — Telephone Encounter (Signed)
Pt said has prod cough with clear thick phlegm; if phlegm gets into throat pt feels like she cannot breathe normal. Pt gargles and that loosens the phlegm so it will come out.pt said she is in no distress with breathing. pt has had problem for years with sinus draining. Now pt said she has prod cough for 3 - 4 times per day clearing the cough that takes approx 15- 20 mins each time. Pt has not tried OTC and wants Dr Darnell Level suggestion of what pt can take or do about this cough with very thick clear phlegm. Pt does not use humidifier. No fever and no other covid symptoms, pt has not traveled and no known exposure to covid. Pt did have covid vaccines.pt did not want to schedule appt at this time but wanted Dr Darnell Level suggestion of what to do. [facility].

## 2020-08-06 NOTE — Telephone Encounter (Signed)
How long has she been having cough?  Recommend plain mucinex with plenty of water during the day to help cough up mucous, may use dimetapp or delsym cough syrup at night to help suppress cough.  Update [place] with effect.

## 2020-08-06 NOTE — Telephone Encounter (Signed)
Spoke with pt asking about cough.  States she has had on and off for past 2 yrs.  It starts with sinus drainage.  Recent episode started about 1 wk ago.  I relayed Dr. Synthia Innocent message.  Pt verbalizes understanding.

## 2020-08-07 ENCOUNTER — Ambulatory Visit
Admission: RE | Admit: 2020-08-07 | Discharge: 2020-08-07 | Disposition: A | Discharge status: Home / Self Care | Payer: Medicare Other | Source: Ambulatory Visit | Attending: Family Medicine | Admitting: Family Medicine

## 2020-08-07 ENCOUNTER — Other Ambulatory Visit: Payer: Self-pay

## 2020-08-07 DIAGNOSIS — R921 Mammographic calcification found on diagnostic imaging of breast: Secondary | ICD-10-CM | POA: Diagnosis not present

## 2020-08-07 DIAGNOSIS — N644 Mastodynia: Secondary | ICD-10-CM | POA: Insufficient documentation

## 2020-08-07 DIAGNOSIS — N6321 Unspecified lump in the left breast, upper outer quadrant: Secondary | ICD-10-CM | POA: Diagnosis not present

## 2020-08-08 ENCOUNTER — Other Ambulatory Visit: Payer: Self-pay | Admitting: Family Medicine

## 2020-08-08 ENCOUNTER — Telehealth: Payer: Self-pay | Admitting: Family Medicine

## 2020-08-08 DIAGNOSIS — R928 Other abnormal and inconclusive findings on diagnostic imaging of breast: Secondary | ICD-10-CM

## 2020-08-08 DIAGNOSIS — N6320 Unspecified lump in the left breast, unspecified quadrant: Secondary | ICD-10-CM

## 2020-08-08 NOTE — Telephone Encounter (Signed)
Spoke with patient regarding abnormal mammogram and [place]. Planned biopsy, scheduling pending.

## 2020-08-08 NOTE — Telephone Encounter (Signed)
Noted! Thank you

## 2020-08-08 NOTE — Telephone Encounter (Signed)
Patient called concerning about her left breast pain. Patient stated that she was told that she will be needing a biopsy due to the finding on the ultrsound. Patient is concern that she may need to do the same thing for the right breast ever though she does not have pain in the right breast.  Please advise. Patient wanted to know if it would be best for her to schedule an appointment to further discuss.

## 2020-08-08 NOTE — Telephone Encounter (Signed)
Patient called and is very anxious. She is asking the progress of getting the biopsy scheduled. She is just very concerned of what is going on with her abnormal ultrasound.  Explained to her that [facility] will contact her to schedule the Biopsy and then we will go from there on further scheduling (ie. Surgery). Pt wanted to be sure that if it comes down to her needing surgery that she will be able to have a say in where she is referred and what Surgeon operates. I advised that at that time, if surgery is necessary, that she will be able to give her input and request where she would like to be referred and to whom. We will work with her.   Will send to Dr Darnell Level as Juluis Rainier.   Patient states that [facility] is going to call her back to schedule her biopsy. Will follow up in the morning.

## 2020-08-09 NOTE — Telephone Encounter (Signed)
Biopsy and clip orders placed by [facility] MM dept -- they will call and schedule the patient.

## 2020-08-16 ENCOUNTER — Ambulatory Visit
Admission: RE | Admit: 2020-08-16 | Discharge: 2020-08-16 | Disposition: A | Discharge status: Home / Self Care | Payer: Medicare Other | Source: Ambulatory Visit | Attending: Family Medicine | Admitting: Family Medicine

## 2020-08-16 ENCOUNTER — Other Ambulatory Visit: Payer: Self-pay

## 2020-08-16 DIAGNOSIS — R928 Other abnormal and inconclusive findings on diagnostic imaging of breast: Secondary | ICD-10-CM | POA: Insufficient documentation

## 2020-08-16 DIAGNOSIS — N6320 Unspecified lump in the left breast, unspecified quadrant: Secondary | ICD-10-CM | POA: Diagnosis not present

## 2020-08-16 DIAGNOSIS — R590 Localized enlarged lymph nodes: Secondary | ICD-10-CM | POA: Diagnosis not present

## 2020-08-16 DIAGNOSIS — C50512 Malignant neoplasm of lower-outer quadrant of left female breast: Secondary | ICD-10-CM | POA: Diagnosis not present

## 2020-08-16 DIAGNOSIS — N6325 Unspecified lump in the left breast, overlapping quadrants: Secondary | ICD-10-CM | POA: Diagnosis not present

## 2020-08-16 DIAGNOSIS — N6323 Unspecified lump in the left breast, lower outer quadrant: Secondary | ICD-10-CM | POA: Diagnosis not present

## 2020-08-16 HISTORY — PX: BREAST BIOPSY: SHX20

## 2020-08-19 ENCOUNTER — Telehealth: Payer: Self-pay | Admitting: *Deleted

## 2020-08-19 ENCOUNTER — Encounter: Payer: Self-pay | Admitting: *Deleted

## 2020-08-19 NOTE — Telephone Encounter (Signed)
Patient left a voicemail stating that she got a call about her breast biopsy from the [facility] at [facility] and it was positive. Patient stated that she is going to need surgery and wants Dr. Bosie Clos opinion as to where to go and who to see. Patient stated that she wants comments from Dr. Danise Mina before she commits herself.

## 2020-08-19 NOTE — Progress Notes (Signed)
Received message from Electa Sniff, RN that patient had been informed of her new diagnosis of invasive mammary carcinoma.  Called patient to introduce to navigation services.  Patient does not know any of the surgeons.  She has placed a call to her PCP's nurse to ask for assistance.  She has also requested that I email her a list of the surgeons so she can research them before choosing one.  I have emailed a Air cabin crew from [employer], [facility] and [employer].  I will contact her again tomorrow to review and see if she is ready to decide.

## 2020-08-20 ENCOUNTER — Encounter: Payer: Self-pay | Admitting: *Deleted

## 2020-08-20 ENCOUNTER — Ambulatory Visit (INDEPENDENT_AMBULATORY_CARE_PROVIDER_SITE_OTHER): Payer: Medicare Other | Admitting: Psychology

## 2020-08-20 DIAGNOSIS — F4323 Adjustment disorder with mixed anxiety and depressed mood: Secondary | ICD-10-CM | POA: Diagnosis not present

## 2020-08-20 DIAGNOSIS — C50912 Malignant neoplasm of unspecified site of left female breast: Secondary | ICD-10-CM

## 2020-08-20 NOTE — Telephone Encounter (Signed)
Called back - she was in appt with psychology - will call later.

## 2020-08-20 NOTE — Telephone Encounter (Signed)
Pt returned your call.  

## 2020-08-20 NOTE — Telephone Encounter (Signed)
Spoke with patient.  Discussed pathology results as well as steps from here.  She is leaning towards seeing Dr Zachery Dauer. May look into [facility] as well. She will let me know if has any trouble scheduling appt.

## 2020-08-20 NOTE — Telephone Encounter (Signed)
Pt called back wanting to get dr g opinion on surgeon  She stated breast will be calling her back today.  Best number [phone]   Pt does have virtual appointment today from 1-2 with another dr

## 2020-08-20 NOTE — Progress Notes (Signed)
Spoke to patient today.  She has researched the surgeons and would like to see Dr. Peyton Najjar.  I have her scheduled to see him on Thursday 08/22/20 @ 1:45 and to see Dr. Tasia Catchings for medical oncology on Wednesday at 3:00.  She is going to pick up her patient breast cancer educational literature, "My Breast Cancer Treatment Handbook" by Josephine Igo, RN today.  Will meet her at her appointment tomorrow with Dr. Tasia Catchings.

## 2020-08-20 NOTE — Telephone Encounter (Signed)
Noted  

## 2020-08-21 ENCOUNTER — Inpatient Hospital Stay: Payer: Medicare Other

## 2020-08-21 ENCOUNTER — Encounter: Payer: Self-pay | Admitting: Oncology

## 2020-08-21 ENCOUNTER — Other Ambulatory Visit: Payer: Self-pay

## 2020-08-21 ENCOUNTER — Encounter: Payer: Self-pay | Admitting: *Deleted

## 2020-08-21 ENCOUNTER — Telehealth: Payer: Self-pay | Admitting: *Deleted

## 2020-08-21 ENCOUNTER — Inpatient Hospital Stay: Payer: Medicare Other | Attending: Oncology | Admitting: Oncology

## 2020-08-21 VITALS — BP 123/83 | HR 79 | Temp 98.7°F | Resp 18 | Ht 65.95 in | Wt 175.9 lb

## 2020-08-21 DIAGNOSIS — Z809 Family history of malignant neoplasm, unspecified: Secondary | ICD-10-CM | POA: Diagnosis not present

## 2020-08-21 DIAGNOSIS — Z803 Family history of malignant neoplasm of breast: Secondary | ICD-10-CM | POA: Diagnosis not present

## 2020-08-21 DIAGNOSIS — Z808 Family history of malignant neoplasm of other organs or systems: Secondary | ICD-10-CM | POA: Insufficient documentation

## 2020-08-21 DIAGNOSIS — C50912 Malignant neoplasm of unspecified site of left female breast: Secondary | ICD-10-CM

## 2020-08-21 DIAGNOSIS — C50812 Malignant neoplasm of overlapping sites of left female breast: Secondary | ICD-10-CM | POA: Insufficient documentation

## 2020-08-21 LAB — COMPREHENSIVE METABOLIC PANEL
ALT: 16 U/L (ref 0–44)
AST: 19 U/L (ref 15–41)
Albumin: 4.3 g/dL (ref 3.5–5.0)
Alkaline Phosphatase: 82 U/L (ref 38–126)
Anion gap: 9 (ref 5–15)
BUN: 24 mg/dL — ABNORMAL HIGH (ref 8–23)
CO2: 25 mmol/L (ref 22–32)
Calcium: 9.1 mg/dL (ref 8.9–10.3)
Chloride: 106 mmol/L (ref 98–111)
Creatinine, Ser: 0.77 mg/dL (ref 0.44–1.00)
GFR calc Af Amer: >60 mL/min (ref >60)
GFR calc non Af Amer: >60 mL/min (ref >60)
Glucose, Bld: 101 mg/dL — ABNORMAL HIGH (ref 70–99)
Potassium: 4.2 mmol/L (ref 3.5–5.1)
Sodium: 140 mmol/L (ref 135–145)
Total Bilirubin: 0.4 mg/dL (ref 0.3–1.2)
Total Protein: 7.4 g/dL (ref 6.5–8.1)

## 2020-08-21 LAB — CBC WITH DIFFERENTIAL/PLATELET
Abs Immature Granulocytes: 0.06 10*3/uL (ref 0.00–0.07)
Basophils Absolute: 0 10*3/uL (ref 0.0–0.1)
Basophils Relative: 0 %
Eosinophils Absolute: 0.2 10*3/uL (ref 0.0–0.5)
Eosinophils Relative: 2 %
HCT: 40.6 % (ref 36.0–46.0)
Hemoglobin: 13.7 g/dL (ref 12.0–15.0)
Immature Granulocytes: 1 %
Lymphocytes Relative: 21 %
Lymphs Abs: 1.5 10*3/uL (ref 0.7–4.0)
MCH: 29 pg (ref 26.0–34.0)
MCHC: 33.7 g/dL (ref 30.0–36.0)
MCV: 85.8 fL (ref 80.0–100.0)
Monocytes Absolute: 0.9 10*3/uL (ref 0.1–1.0)
Monocytes Relative: 12 %
Neutro Abs: 4.7 10*3/uL (ref 1.7–7.7)
Neutrophils Relative %: 64 %
Platelets: 221 10*3/uL (ref 150–400)
RBC: 4.73 MIL/uL (ref 3.87–5.11)
RDW: 14.1 % (ref 11.5–15.5)
WBC: 7.4 10*3/uL (ref 4.0–10.5)
nRBC: 0 % (ref 0.0–0.2)

## 2020-08-21 NOTE — Progress Notes (Signed)
Hematology/Oncology Consult note [facility] [phone] [phone]   Patient Care Team: Ria Bush, MD as PCP - General (Family Medicine)  REFERRING PROVIDER: Ria Bush, MD  CHIEF COMPLAINTS/REASON FOR VISIT:  Evaluation of breast cancer   HISTORY OF PRESENTING ILLNESS:   Beth Rodgers is a  85 y.o.  female with PMH listed below was seen in consultation at the request of  Ria Bush, MD  for evaluation of breast cancer Patient self palpated left breast mass in August 2021. 8/11/2021Bilateral diagnostic mammogram showed 2 adjacent suspicious mass with interspersed calcifications in the left breast at the 4:00 and 6:00 respectively.  Together the masses span 4.5 cm on ultrasound.  2 indeterminate lymph nodes in the left axilla.  No evidence of malignancy in the right breast.  Patient underwent ultrasound biopsy of the 2 left breast masses and one of the thickened left axillary lymph node. Pathology showed Left breast 4:00, grade 1 invasive mammary carcinoma no special type. Left breast 6:00, grade 1, invasive mammary carcinoma, lobular features. Left axillary lymph node negative for malignancy.  Patient denies any nipple discharge, skin changes.  She has had previous breast biopsy many years ago and she cannot remember details.  She cannot remember which breast. Patient has 2 children.  Age at first birth [age] Menopause at age about 4 OCP use for 79 or more years. Remote breast biopsy about 20 years ago.  Family history positive for first cousin with breast cancer, mother with thyroid cancer.   Review of Systems  Constitutional: Negative for appetite change, chills, fatigue and fever.  HENT:   Negative for hearing loss and voice change.   Eyes: Negative for eye problems.  Respiratory: Negative for chest tightness and cough.   Cardiovascular: Negative for chest pain.  Gastrointestinal: Negative for abdominal  distention, abdominal pain and blood in stool.  Endocrine: Negative for hot flashes.  Genitourinary: Negative for difficulty urinating and frequency.   Musculoskeletal: Negative for arthralgias.  Skin: Negative for itching and rash.  Neurological: Negative for extremity weakness.  Hematological: Negative for adenopathy.  Psychiatric/Behavioral: Negative for confusion.    MEDICAL HISTORY:  Past Medical History:  Diagnosis Date  . Basal cell carcinoma (BCC)   . Cellulitis 08/10/2018  . Chronic venous insufficiency    with varicose veins  . Glaucoma   . History  of basal cell carcinoma    left nare  . History of chicken pox   . Hypothyroid   . Osteoarthritis    back pain, L knee pain  . Osteopenia 06/2009   DEXA 11/2015: T -2.3 hip, -2.2 spine, on longterm bisphosphonate/evista  . Pyogenic granuloma 04/16/2016    SURGICAL HISTORY: Past Surgical History:  Procedure Laterality Date  . BASAL CELL CARCINOMA EXCISION     located on face  . BREAST BIOPSY Left    core done ing [place]?  Marland Kitchen BREAST BIOPSY Left 08/16/2020   3 area bx 4:00 X 6:00Q LN hydro #3  . CATARACT EXTRACTION     bilateral  . DEXA  06/2009   T score Spine: -1.8, hip -2.0  . Foam sclerotherapy Bilateral 09/2015   [place], [place]  . NASAL RECONSTRUCTION  2005   for BCC L nare s/p Mohs  . nuclear stress test  2014   normal in [place]  . RADIOFREQUENCY ABLATION Left 01/2012   remnant L G saphenous vein below knee  . RADIOFREQUENCY ABLATION Right 02/2013   RLE vein ablation   .  VEIN LIGATION AND STRIPPING  remote   bilateral G saphenous veins    SOCIAL HISTORY: Social History   Socioeconomic History  . Marital status: Married    Spouse name: Not on file  . Number of children: 2  . Years of education: Not on file  . Highest education level: Not on file  Occupational History    Employer: RETIRED  Tobacco Use  . Smoking status: Never Smoker  . Smokeless tobacco: Never Used  Vaping Use  .  Vaping Use: Never used  Substance and Sexual Activity  . Alcohol use: Yes    Comment: Occasionally drinks wine  . Drug use: No  . Sexual activity: Not Currently    Birth control/protection: Post-menopausal  Other Topics Concern  . Not on file  Social History Narrative   From [place], [place]   Caffeine: 2-3 cups/day   Lives with husband, majority of time alone as he travels to [place].   Has grandchildren nearby.   Occ: Field seismologist, Metallurgist   Activity: bed exercises, limiting walking   Diet: does get fruits and vegetables, only some water   Social Determinants of Health   Financial Resource Strain: Low Risk   . Difficulty of Paying Living Expenses: Not hard at all  Food Insecurity: No Food Insecurity  . Worried About Charity fundraiser in the Last Year: Never true  . Ran Out of Food in the Last Year: Never true  Transportation Needs: No Transportation Needs  . Lack of Transportation (Medical): No  . Lack of Transportation (Non-Medical): No  Physical Activity: Sufficiently Active  . Days of Exercise per Week: 7 days  . Minutes of Exercise per Session: 40 min  Stress: No Stress Concern Present  . Feeling of Stress : Not at all  Social Connections:   . Frequency of Communication with Friends and Family: Not on file  . Frequency of Social Gatherings with Friends and Family: Not on file  . Attends Religious Services: Not on file  . Active Member of Clubs or Organizations: Not on file  . Attends Archivist Meetings: Not on file  . Marital Status: Not on file  Intimate Partner Violence: Not At Risk  . Fear of Current or Ex-Partner: No  . Emotionally Abused: No  . Physically Abused: No  . Sexually Abused: No    FAMILY HISTORY: Family History  Problem Relation Age of Onset  . Cancer Mother        thyroid cancer  . Heart disease Father        CHF  . Diabetes Father   . Glaucoma Father   . Arthritis Father   . Coronary artery disease Maternal Uncle    . Bipolar disorder Son   . Heart disease Sister        CHF  . Stroke Sister   . Breast cancer Neg Hx     ALLERGIES:  has No Known Allergies.  MEDICATIONS:  Current Outpatient Medications  Medication Sig Dispense Refill  . Ascorbic Acid (VITAMIN C CR PO) Take by mouth as directed. Reported on 12/17/2015    . CALCIUM CARBONATE PO Take 600 mg by mouth as directed. Reported on 12/17/2015    . Cholecalciferol (VITAMIN D) 50 MCG (2000 UT) CAPS Take 1 capsule (2,000 Units total) by mouth daily. 30 capsule   . CICLOPIROX EX Apply topically as needed. Reported on 12/17/2015    . diclofenac sodium (VOLTAREN) 1 % GEL Apply 1 application topically 3 (three)  times daily. 1 Tube 0  . fluticasone (CUTIVATE) 0.05 % cream Apply topically daily. For max 2 wks at a time 30 g 0  . halobetasol (ULTRAVATE) 0.05 % ointment APPLY TO AFFECTED AREA TWICE A DAY  1  . levothyroxine (SYNTHROID) 75 MCG tablet Take 1 tablet (75 mcg total) by mouth daily. 90 tablet 3  . losartan (COZAAR) 100 MG tablet Take 1 tablet (100 mg total) by mouth daily. 90 tablet 3  . Multiple Vitamin (MULTIVITAMIN) tablet Take 1 tablet by mouth as needed. Reported on 12/17/2015    . NITROSTAT 0.4 MG SL tablet every 5 (five) minutes as needed. Reported on 04/16/2016    . Travoprost, BAK Free, (TRAVATAN) 0.004 % SOLN ophthalmic solution Place 1 drop into both eyes at bedtime.     No current facility-administered medications for this visit.     PHYSICAL EXAMINATION: ECOG PERFORMANCE STATUS: 0 - Asymptomatic Vitals:   08/21/20 1531  BP: 123/83  Pulse: 79  Resp: 18  Temp: 98.7 F (37.1 C)   Filed Weights   08/21/20 1531  Weight: 175 lb 14.4 oz (79.8 kg)    Physical Exam Constitutional:      General: She is not in acute distress. HENT:     Head: Normocephalic and atraumatic.  Eyes:     General: No scleral icterus. Cardiovascular:     Rate and Rhythm: Normal rate and regular rhythm.     Heart sounds: Normal heart sounds.    Pulmonary:     Effort: Pulmonary effort is normal. No respiratory distress.     Breath sounds: No wheezing.  Abdominal:     General: Bowel sounds are normal. There is no distension.     Palpations: Abdomen is soft.  Musculoskeletal:        General: No deformity. Normal range of motion.     Cervical back: Normal range of motion and neck supple.  Skin:    General: Skin is warm and dry.     Findings: No erythema or rash.  Neurological:     Mental Status: She is alert and oriented to person, place, and time. Mental status is at baseline.     Cranial Nerves: No cranial nerve deficit.     Coordination: Coordination normal.  Psychiatric:        Mood and Affect: Mood normal.   Breast exam was performed in seated and lying down position. Left breast left outer quadrant mass, no palpable bilateral axillary lymphadenopathy.  No palpable right breast mass.    LABORATORY DATA:  I have reviewed the data as listed Lab Results  Component Value Date   WBC 7.4 08/21/2020   HGB 13.7 08/21/2020   HCT 40.6 08/21/2020   MCV 85.8 08/21/2020   PLT 221 08/21/2020   Recent Labs    02/16/20 1128 08/21/20 1625  NA 140 140  K 4.5 4.2  CL 104 106  CO2 29 25  GLUCOSE 82 101*  BUN 19 24*  CREATININE 0.73 0.77  CALCIUM 9.0 9.1  GFRNONAA  --  >60  GFRAA  --  >60  PROT 6.7 7.4  ALBUMIN 4.1 4.3  AST 18 19  ALT 18 16  ALKPHOS 87 82  BILITOT 0.6 0.4   Iron/TIBC/Ferritin/ %Sat No results found for: IRON, TIBC, FERRITIN, IRONPCTSAT    RADIOGRAPHIC STUDIES: I have personally reviewed the radiological images as listed and agreed with the findings in the report. US BREAST LTD UNI LEFT INC AXILLA  Result Date: 08/07/2020 CLINICAL  DATA:  85 year old female presenting for evaluation of a palpable lump in the left breast. EXAM: DIGITAL DIAGNOSTIC BILATERAL MAMMOGRAM WITH TOMO AND CAD; ULTRASOUND LEFT BREAST LIMITED COMPARISON:  Previous exam(s). ACR Breast Density Category b: There are scattered  areas of fibroglandular density. FINDINGS: In the region of the palpable marker in the lower outer quadrant of the left breast, there are 2 adjacent spiculated masses together, the masses span approximately 4 cm. There are multiple punctate calcifications within and between these 2 masses. In the upper inner quadrant of the left breast, anterior depth, there is a superficial 4 mm oval circumscribed mass, new from the prior exam. No other suspicious calcifications, masses or areas of distortion are seen in the bilateral breasts. Mammographic images were processed with CAD. For there is a firm palpable mass in the lower outer quadrant of the left breast on physical exam. Ultrasound targeted to left breast at 4 o'clock, 4 cm from the nipple demonstrates an irregular ill-defined hypoechoic mass measuring 1.9 x 1.3 x 1.3 cm. In the left breast at 6 o'clock, 3 cm from the nipple there is a similar-appearing but smaller hypoechoic shadowing mass measuring 1.0 x 0.7 x 0.9 cm. These 2 masses together span 4.5 cm. Ultrasound of the left breast at 10 o'clock, 3 cm from the nipple demonstrates an anechoic oval circumscribed mass measuring 0.4 x 0.2 x 0.3 cm. Ultrasound of the left axilla demonstrates 2 prominent lymph nodes with mildly thickened cortices up to 5 mm. IMPRESSION: 1. There are 2 adjacent suspicious masses with interspersed calcifications in the left breast at 4 o'clock and 6 o'clock respectively. Together, the masses span 4.5 cm on ultrasound. 2.  There are 2 indeterminate lymph nodes in the left axilla. 3.  No evidence of malignancy in the right breast. RECOMMENDATION: Ultrasound guided biopsy is recommended for the 2 masses in the lower outer left breast and 1 of the thickened left axillary lymph nodes. I have discussed the findings and recommendations with the patient. If applicable, a reminder letter will be sent to the patient regarding the next appointment. BI-RADS CATEGORY  5: Highly suggestive of  malignancy. Electronically Signed   By: Ammie Ferrier M.D.   On: 08/07/2020 16:10   MM DIAG BREAST TOMO BILATERAL  Result Date: 08/07/2020 CLINICAL DATA:  85 year old female presenting for evaluation of a palpable lump in the left breast. EXAM: DIGITAL DIAGNOSTIC BILATERAL MAMMOGRAM WITH TOMO AND CAD; ULTRASOUND LEFT BREAST LIMITED COMPARISON:  Previous exam(s). ACR Breast Density Category b: There are scattered areas of fibroglandular density. FINDINGS: In the region of the palpable marker in the lower outer quadrant of the left breast, there are 2 adjacent spiculated masses together, the masses span approximately 4 cm. There are multiple punctate calcifications within and between these 2 masses. In the upper inner quadrant of the left breast, anterior depth, there is a superficial 4 mm oval circumscribed mass, new from the prior exam. No other suspicious calcifications, masses or areas of distortion are seen in the bilateral breasts. Mammographic images were processed with CAD. For there is a firm palpable mass in the lower outer quadrant of the left breast on physical exam. Ultrasound targeted to left breast at 4 o'clock, 4 cm from the nipple demonstrates an irregular ill-defined hypoechoic mass measuring 1.9 x 1.3 x 1.3 cm. In the left breast at 6 o'clock, 3 cm from the nipple there is a similar-appearing but smaller hypoechoic shadowing mass measuring 1.0 x 0.7 x 0.9 cm. These 2 masses  together span 4.5 cm. Ultrasound of the left breast at 10 o'clock, 3 cm from the nipple demonstrates an anechoic oval circumscribed mass measuring 0.4 x 0.2 x 0.3 cm. Ultrasound of the left axilla demonstrates 2 prominent lymph nodes with mildly thickened cortices up to 5 mm. IMPRESSION: 1. There are 2 adjacent suspicious masses with interspersed calcifications in the left breast at 4 o'clock and 6 o'clock respectively. Together, the masses span 4.5 cm on ultrasound. 2.  There are 2 indeterminate lymph nodes in the left  axilla. 3.  No evidence of malignancy in the right breast. RECOMMENDATION: Ultrasound guided biopsy is recommended for the 2 masses in the lower outer left breast and 1 of the thickened left axillary lymph nodes. I have discussed the findings and recommendations with the patient. If applicable, a reminder letter will be sent to the patient regarding the next appointment. BI-RADS CATEGORY  5: Highly suggestive of malignancy. Electronically Signed   By: Frederico Hamman M.D.   On: 08/07/2020 16:10   MM CLIP PLACEMENT LEFT  Result Date: 08/16/2020 CLINICAL DATA:  Post ultrasound-guided core needle biopsy 2 left breast masses and a left axillary lymph node. EXAM: DIAGNOSTIC LEFT MAMMOGRAM POST ULTRASOUND BIOPSY COMPARISON:  Previous exam(s). FINDINGS: Mammographic images were obtained following ultrasound guided biopsy of 2 left breast masses and a left axillary lymph node. The biopsy marking clips are in expected positions at the biopsy sites. IMPRESSION: Appropriate positioning of the X shaped biopsy marking clip at the site of biopsy in the left breast 4 o'clock mass. Appropriate positioning of the Q shaped biopsy marking clip at the site of biopsy in the left breast 6 o'clock mass. Appropriate positioning of the #3 HydroMARK shaped biopsy marking clip at the site of biopsy in the left axilla. Final Assessment: Post Procedure Mammograms for Marker Placement Electronically Signed   By: Ted Mcalpine M.D.   On: 08/16/2020 10:11   [place] LT BREAST BX W LOC DEV 1ST LESION IMG BX SPEC US GUIDE  Result Date: 08/16/2020 CLINICAL DATA:  Two suspicious left breast masses, and indeterminate left axillary lymph node. EXAM: ULTRASOUND GUIDED LEFT BREAST CORE NEEDLE BIOPSY COMPARISON:  Previous exam(s). PROCEDURE: I met with the patient and we discussed the procedure of ultrasound-guided biopsy, including benefits and alternatives. We discussed the high likelihood of a successful procedure. We discussed the risks of  the procedure, including infection, bleeding, tissue injury, clip migration, and inadequate sampling. Informed written consent was given. The usual time-out protocol was performed immediately prior to the procedure. Lesion quadrant: Lower outer quadrant Using sterile technique and 1% Lidocaine as local anesthetic, under direct ultrasound visualization, a 14 gauge spring-loaded device was used to perform biopsy of left breast 4 o'clock mass using a lateral approach. At the conclusion of the procedure X shaped tissue marker clip was deployed into the biopsy cavity. Next, using sterile technique and 1% Lidocaine as local anesthetic, under direct ultrasound visualization, a 14 gauge spring-loaded device was used to perform biopsy of left breast 6 o'clock mass using a lateral approach. At the conclusion of the procedure Q shaped tissue marker clip was deployed into the biopsy cavity. Next, using sterile technique and 1% Lidocaine as local anesthetic, under direct ultrasound visualization, a 14 gauge spring-loaded device was used to perform biopsy of left axillary lymph node using a lateral approach. At the conclusion of the procedure #3 [facility] tissue marker clip was deployed into the biopsy cavity. Follow up 2 view mammogram was performed and dictated  separately. IMPRESSION: Ultrasound guided biopsy of the left breast. No apparent complications. Electronically Signed   By: Fidela Salisbury M.D.   On: 08/16/2020 11:35   [place] LT BREAST BX W LOC DEV EA ADD LESION IMG BX SPEC US GUIDE  Result Date: 08/16/2020 CLINICAL DATA:  Two suspicious left breast masses, and indeterminate left axillary lymph node. EXAM: ULTRASOUND GUIDED LEFT BREAST CORE NEEDLE BIOPSY COMPARISON:  Previous exam(s). PROCEDURE: I met with the patient and we discussed the procedure of ultrasound-guided biopsy, including benefits and alternatives. We discussed the high likelihood of a successful procedure. We discussed the risks of the procedure,  including infection, bleeding, tissue injury, clip migration, and inadequate sampling. Informed written consent was given. The usual time-out protocol was performed immediately prior to the procedure. Lesion quadrant: Lower outer quadrant Using sterile technique and 1% Lidocaine as local anesthetic, under direct ultrasound visualization, a 14 gauge spring-loaded device was used to perform biopsy of left breast 4 o'clock mass using a lateral approach. At the conclusion of the procedure X shaped tissue marker clip was deployed into the biopsy cavity. Next, using sterile technique and 1% Lidocaine as local anesthetic, under direct ultrasound visualization, a 14 gauge spring-loaded device was used to perform biopsy of left breast 6 o'clock mass using a lateral approach. At the conclusion of the procedure Q shaped tissue marker clip was deployed into the biopsy cavity. Next, using sterile technique and 1% Lidocaine as local anesthetic, under direct ultrasound visualization, a 14 gauge spring-loaded device was used to perform biopsy of left axillary lymph node using a lateral approach. At the conclusion of the procedure #3 [facility] tissue marker clip was deployed into the biopsy cavity. Follow up 2 view mammogram was performed and dictated separately. IMPRESSION: Ultrasound guided biopsy of the left breast. No apparent complications. Electronically Signed   By: Fidela Salisbury M.D.   On: 08/16/2020 11:35   [place] LT BREAST BX W LOC DEV EA ADD LESION IMG BX SPEC US GUIDE  Result Date: 08/16/2020 CLINICAL DATA:  Two suspicious left breast masses, and indeterminate left axillary lymph node. EXAM: ULTRASOUND GUIDED LEFT BREAST CORE NEEDLE BIOPSY COMPARISON:  Previous exam(s). PROCEDURE: I met with the patient and we discussed the procedure of ultrasound-guided biopsy, including benefits and alternatives. We discussed the high likelihood of a successful procedure. We discussed the risks of the procedure, including  infection, bleeding, tissue injury, clip migration, and inadequate sampling. Informed written consent was given. The usual time-out protocol was performed immediately prior to the procedure. Lesion quadrant: Lower outer quadrant Using sterile technique and 1% Lidocaine as local anesthetic, under direct ultrasound visualization, a 14 gauge spring-loaded device was used to perform biopsy of left breast 4 o'clock mass using a lateral approach. At the conclusion of the procedure X shaped tissue marker clip was deployed into the biopsy cavity. Next, using sterile technique and 1% Lidocaine as local anesthetic, under direct ultrasound visualization, a 14 gauge spring-loaded device was used to perform biopsy of left breast 6 o'clock mass using a lateral approach. At the conclusion of the procedure Q shaped tissue marker clip was deployed into the biopsy cavity. Next, using sterile technique and 1% Lidocaine as local anesthetic, under direct ultrasound visualization, a 14 gauge spring-loaded device was used to perform biopsy of left axillary lymph node using a lateral approach. At the conclusion of the procedure #3 [facility] tissue marker clip was deployed into the biopsy cavity. Follow up 2 view mammogram was performed and dictated separately. IMPRESSION:  Ultrasound guided biopsy of the left breast. No apparent complications. Electronically Signed   By: Fidela Salisbury M.D.   On: 08/16/2020 11:35      ASSESSMENT & PLAN:  1. Malignant neoplasm of overlapping sites of left female breast, unspecified estrogen receptor status ([place])   2. Family history of cancer    Left breast multifocal breast invasive mammary carcinoma.  4:00  1.9 x 1.3 x 1.3 grade 1 T1c  6:00  1x0.7 x0.9 grade 1 T1b with lobular feature, spans over 4.5cm.  Recommend MRI breast bilaterally for further evaluation.  ER/PR/HER2 status are pending at the time of the dictation. Discussed with pathology. Results maybe signed out tomorrow. Further  management plan is pending on MRI results, receptor status.  Patient has appointment to see Dr.Cintron tomorrow.   Family history of cancer, refer to genetic counselor.   Orders Placed This Encounter  Procedures  . MR BREAST BILATERAL W WO CONTRAST INC CAD    Standing Status:   Future    Standing Expiration Date:   08/21/2021    Order Specific Question:   If indicated for the ordered procedure, I authorize the administration of contrast media per Radiology protocol    Answer:   Yes    Order Specific Question:   What is the patient's sedation requirement?    Answer:   No Sedation    Order Specific Question:   Does the patient have a pacemaker or implanted devices?    Answer:   No    Order Specific Question:   Radiology Contrast Protocol - do NOT remove file path    Answer:   [email or web address]    Order Specific Question:   Preferred imaging location?    Answer:   [facility] (table limit - 550lbs)  . CBC with Differential/Platelet    Standing Status:   Future    Number of Occurrences:   1    Standing Expiration Date:   08/21/2021  . Comprehensive metabolic panel    Standing Status:   Future    Number of Occurrences:   1    Standing Expiration Date:   08/21/2021  . Cancer antigen 15-3    Standing Status:   Future    Number of Occurrences:   1    Standing Expiration Date:   08/21/2021  . Cancer antigen 27.29    Standing Status:   Future    Number of Occurrences:   1    Standing Expiration Date:   08/21/2021  . Ambulatory referral to Genetics    Referral Priority:   Routine    Referral Type:   Consultation    Referral Reason:   Specialty Services Required    Referred to Provider:   Faith Rogue T    Number of Visits Requested:   1    All questions were answered. The patient knows to call the clinic with any problems questions or concerns.   Ria Bush, MD    Return of visit:  Thank you for this kind referral and the opportunity to  participate in the care of this patient. A copy of today's note is routed to referring provider    Earlie Server, MD, PhD Hematology Oncology [facility] at [facility] Pager- [phone] 08/21/2020

## 2020-08-21 NOTE — Progress Notes (Signed)
New patient evaluation.   

## 2020-08-21 NOTE — Progress Notes (Signed)
Met patient today during her initial medical oncology consult with Dr. Tasia Catchings.  ER/PR/HER2 are still pending.  Dr. Tasia Catchings has ordered an MRI.  She is scheduled to see Dr. Peyton Najjar tomorrow afternoon.  She is to call with any questions or needs.

## 2020-08-21 NOTE — Telephone Encounter (Signed)
Per 08/21/20 los pt need a Breast MRI scheduled.  Lorriane Shire from [facility] stated that she will work on getting it scheduled and will contact pt with an appt date and time for her Breast MRI

## 2020-08-22 ENCOUNTER — Inpatient Hospital Stay: Payer: Medicare Other | Admitting: Oncology

## 2020-08-22 ENCOUNTER — Inpatient Hospital Stay: Payer: Medicare Other

## 2020-08-22 DIAGNOSIS — C50512 Malignant neoplasm of lower-outer quadrant of left female breast: Secondary | ICD-10-CM | POA: Diagnosis not present

## 2020-08-22 LAB — CANCER ANTIGEN 27.29: CA 27.29: 10.7 U/mL (ref 0.0–38.6)

## 2020-08-22 LAB — CANCER ANTIGEN 15-3: CA 15-3: 10.4 U/mL (ref 0.0–25.0)

## 2020-08-23 ENCOUNTER — Other Ambulatory Visit: Payer: Medicare Other

## 2020-08-23 ENCOUNTER — Ambulatory Visit: Payer: Medicare Other | Admitting: Oncology

## 2020-08-23 DIAGNOSIS — L570 Actinic keratosis: Secondary | ICD-10-CM | POA: Diagnosis not present

## 2020-08-23 DIAGNOSIS — D1801 Hemangioma of skin and subcutaneous tissue: Secondary | ICD-10-CM | POA: Diagnosis not present

## 2020-08-23 DIAGNOSIS — L820 Inflamed seborrheic keratosis: Secondary | ICD-10-CM | POA: Diagnosis not present

## 2020-08-26 ENCOUNTER — Encounter: Payer: Self-pay | Admitting: Family Medicine

## 2020-08-26 NOTE — Telephone Encounter (Signed)
MRI scheduled for 08/27/20

## 2020-08-27 ENCOUNTER — Ambulatory Visit
Admission: RE | Admit: 2020-08-27 | Discharge: 2020-08-27 | Disposition: A | Discharge status: Home / Self Care | Payer: Medicare Other | Source: Ambulatory Visit | Attending: Oncology | Admitting: Oncology

## 2020-08-27 ENCOUNTER — Other Ambulatory Visit: Payer: Self-pay

## 2020-08-27 DIAGNOSIS — C50912 Malignant neoplasm of unspecified site of left female breast: Secondary | ICD-10-CM | POA: Diagnosis not present

## 2020-08-27 DIAGNOSIS — C50812 Malignant neoplasm of overlapping sites of left female breast: Secondary | ICD-10-CM | POA: Diagnosis not present

## 2020-08-27 DIAGNOSIS — M67872 Other specified disorders of synovium, left ankle and foot: Secondary | ICD-10-CM | POA: Diagnosis not present

## 2020-08-27 MED ORDER — GADOBUTROL 1 MMOL/ML IV SOLN
7.0000 mL | Freq: Once | INTRAVENOUS | Status: AC | PRN
  Administered 2020-08-27: 7 mL via INTRAVENOUS

## 2020-08-28 ENCOUNTER — Other Ambulatory Visit: Payer: Self-pay | Admitting: Oncology

## 2020-08-28 ENCOUNTER — Encounter: Payer: Self-pay | Admitting: *Deleted

## 2020-08-28 DIAGNOSIS — M7660 Achilles tendinitis, unspecified leg: Secondary | ICD-10-CM | POA: Insufficient documentation

## 2020-08-28 DIAGNOSIS — N6320 Unspecified lump in the left breast, unspecified quadrant: Secondary | ICD-10-CM

## 2020-08-28 NOTE — Progress Notes (Signed)
Patient called and questioned the results of her MRI and to see if she needed any further appointments.  Informed patient that I had talked with Dr. Deniece Ree nurse this morning and that Dr. Peyton Najjar wants to see her this week to review the results with her.  She is to call his office to set up appointment to be seen this week.

## 2020-08-29 ENCOUNTER — Encounter: Payer: Self-pay | Admitting: Family Medicine

## 2020-08-29 ENCOUNTER — Other Ambulatory Visit: Payer: Self-pay | Admitting: Oncology

## 2020-08-29 DIAGNOSIS — C50512 Malignant neoplasm of lower-outer quadrant of left female breast: Secondary | ICD-10-CM | POA: Diagnosis not present

## 2020-08-29 DIAGNOSIS — R928 Other abnormal and inconclusive findings on diagnostic imaging of breast: Secondary | ICD-10-CM

## 2020-08-29 DIAGNOSIS — N6459 Other signs and symptoms in breast: Secondary | ICD-10-CM

## 2020-08-29 DIAGNOSIS — N6320 Unspecified lump in the left breast, unspecified quadrant: Secondary | ICD-10-CM

## 2020-08-30 ENCOUNTER — Ambulatory Visit
Admission: RE | Admit: 2020-08-30 | Discharge: 2020-08-30 | Disposition: A | Discharge status: Home / Self Care | Payer: Medicare Other | Source: Ambulatory Visit | Attending: Oncology | Admitting: Oncology

## 2020-08-30 ENCOUNTER — Other Ambulatory Visit: Payer: Self-pay

## 2020-08-30 DIAGNOSIS — N6320 Unspecified lump in the left breast, unspecified quadrant: Secondary | ICD-10-CM | POA: Diagnosis not present

## 2020-08-30 DIAGNOSIS — N6002 Solitary cyst of left breast: Secondary | ICD-10-CM | POA: Diagnosis not present

## 2020-09-03 ENCOUNTER — Ambulatory Visit (INDEPENDENT_AMBULATORY_CARE_PROVIDER_SITE_OTHER): Payer: Medicare Other | Admitting: Psychology

## 2020-09-03 DIAGNOSIS — F4323 Adjustment disorder with mixed anxiety and depressed mood: Secondary | ICD-10-CM | POA: Diagnosis not present

## 2020-09-04 ENCOUNTER — Ambulatory Visit: Payer: Self-pay | Admitting: General Surgery

## 2020-09-04 ENCOUNTER — Other Ambulatory Visit: Payer: Self-pay | Admitting: General Surgery

## 2020-09-04 DIAGNOSIS — C50512 Malignant neoplasm of lower-outer quadrant of left female breast: Secondary | ICD-10-CM

## 2020-09-04 NOTE — H&P (Signed)
HISTORY OF PRESENT ILLNESS:    Beth Rodgers is a 85 y.o.female patient who comes for evaluation of MRI of the breast and for discussion of surgical planning.  Patient was initially found with 2 masses on the left breast.  One of the masses was found with invasive ductal carcinoma and a second mass with invasive level carcinoma.  MRI was recommended.  MRI was done and she was found with left third area of concern under the nipple.  Patient denies any pain.  Patient denies any pain radiation.  There is no alleviating or aggravating factors.  Patient was oriented about this finding of the MRI.      PAST MEDICAL HISTORY:      Past Medical History:  Diagnosis Date  . Arthritis    osteoarthritis  . Basal cell carcinoma   . Glaucoma (increased eye pressure)   . Thyroid disease    hypothyroid  . Venous disease 06/11/2012   From Clinic Note5/07/2012 She underwent noninvasive venous testing revealing her to have anterior accessory and small saphenous reflux bilaterally.  Her great saphenous veins are missing bilaterally due to surgical stripping, and she has reflux in her deep system as well.  Overall, given her pigmentation and changes consistent of longstanding chronic venous insufficiency, I would like to see if she        PAST SURGICAL HISTORY:        Past Surgical History:  Procedure Laterality Date  . BREAST EXCISIONAL BIOPSY    . EYE SURGERY Bilateral    cataract  . Nasal reconstruction for basal cell carcinoma    . Vein Stripping & Ligation           MEDICATIONS:  Encounter Medications        Outpatient Encounter Medications as of 08/29/2020  Medication Sig Dispense Refill  . ascorbic acid, vitamin C, 500 mg TbER Take 1 tablet by mouth    . betamethasone dipropionate 0.05 % lotion betamethasone dipropionate 0.05 % lotion    . betamethasone dipropionate, augmented, (DIPROLENE-AF) 0.05 % cream betamethasone, augmented 0.05 % topical cream    . calcium carbonate  600 mg calcium (1,500 mg) Tab tablet Take 1 tablet by mouth    . celecoxib (CELEBREX) 200 MG capsule 1 capsule as needed    . cholecalciferol (VITAMIN D3) 1,250 mcg (50,000 unit) capsule Take 50,000 Units by mouth every 7 (seven) days    . cholecalciferol (VITAMIN D3) 2,000 unit capsule Take 1 capsule by mouth once daily as needed    . ciclopirox (LOPROX) 0.77 % cream APPLY 1 APPLICATION ON THE SKIN OF FEET TWICE A DAY    . clobetasoL (CORMAX) 0.05 % external solution APPLY 1 ML TO THE SCALP TWICE A DAY    . fluticasone propionate (CUTIVATE) 0.05 % cream Apply topically    . halobetasol (ULTRAVATE) 0.05 % ointment halobetasol propionate 0.05 % topical ointment    . levothyroxine (SYNTHROID) 75 MCG tablet Take 1 tablet by mouth once daily    . losartan (COZAAR) 100 MG tablet Take 1 tablet by mouth once daily    . multivitamin (MULTIVITAMIN) tablet Take by mouth.    . nitroGLYcerin (NITROSTAT) 0.4 MG SL tablet Reported on 12/17/2015    . travoprost (TRAVATAN Z) 0.004 % Ophth ophthalmic solution Apply 1 drop to eye nightly    . [DISCONTINUED] betamethasone dipropionate 0.05 % lotion APPLY 1 APPLICATION TO THE SCALP TWICE A DAY AS NEEDED FOR FLARE    . [DISCONTINUED] cholecalciferol (VITAMIN D3)  1,250 mcg (50,000 unit) capsule cholecalciferol (vitamin D3) 1,250 mcg (50,000 unit) capsule  TAKE 1 CAPSULE BY MOUTH ONE TIME PER WEEK    . [DISCONTINUED] cholecalciferol, vitamin D3, 10 mcg (400 unit) Cap Take 1 L by mouth     No facility-administered encounter medications on file as of 08/29/2020.       ALLERGIES:   Patient has no known allergies.   SOCIAL HISTORY:  Social History          Socioeconomic History  . Marital status: Married    Spouse name: Not on file  . Number of children: Not on file  . Years of education: Not on file  . Highest education level: Not on file  Occupational History  . Not on file  Tobacco Use  . Smoking status: Never Smoker   . Smokeless tobacco: Never Used  Vaping Use  . Vaping Use: Never used  Substance and Sexual Activity  . Alcohol use: Not on file  . Drug use: Not on file  . Sexual activity: Not on file  Other Topics Concern  . Not on file  Social History Narrative  . Not on file   Social Determinants of Health      Financial Resource Strain:   . Difficulty of Paying Living Expenses:   Food Insecurity:   . Worried About Charity fundraiser in the Last Year:   . Arboriculturist in the Last Year:   Transportation Needs:   . Film/video editor (Medical):   Marland Kitchen Lack of Transportation (Non-Medical):       FAMILY HISTORY:  History reviewed. No pertinent family history.    PHYSICAL EXAM:     Vitals:   08/29/20 1105  BP: (!) 166/96  Pulse: 83  .  Ht:167.6 cm (5\' 6" ) Wt:74.4 kg (164 lb) WPY:KDXI surface area is 1.86 meters squared. Body mass index is 26.47 kg/m.Marland Kitchen   GENERAL: Alert, active, oriented x3  BREAST: There is no palpable mass on the left breast.  There is a healing bruise on the left breast.  Bedside ultrasound shows as very small hypoechoic area under the nipple but not enough to be biopsied.  HEART: regular rate and rhythm  LUNGS: clear to auscultation  EXTREMITIES: Well-developed well-nourished symmetrical with no dependent edema.  NEUROLOGICAL: Awake alert oriented, facial expression symmetrical, moving all extremities.      IMPRESSION:     Malignant neoplasm of lower-outer quadrant of left female breast, unspecified estrogen receptor status (CMS-HCC) [C50.512] -Patient with 2 masses on the left breast with confirmation of invasive lobular invasive ductal carcinoma. -Another area of concern was found on the MRI under the nipple of the left breast.  Bedside ultrasound without any easy area to proceed with biopsy.  I will consult radiology for an official ultrasound to see if the lesion can be biopsied.  This will help to plan the surgical management.  If this  lesion is malignant it would be reasonable to discuss with the patient the possibility of mastectomy.  If this lesion is not malignant or it cannot be biopsied before surgery patient will need partial mastectomy of the known malignant masses and plus/minus excisional biopsy of the nipple area.  All these recommendation were discussed with the patient and she is in agreement to proceed with the ultrasound.    No lesion was identified by breast radiologist. Patient oriented about doing excisional biopsy during partial mastectomy. She understand that if final pathology shows cancer of these new  lesion, will need to have another surgery for excision of the nipple areola complex. Patient desire to try to preserve the breast as possible. Patient oriented about risks of surgery.    PLAN:  1. Left breast partial mastectomy and sentinel lymph node biopsy 2. Left breast retroareolar excisional biopsy.   Patient and her husband verbalized understanding, all questions were answered, and were agreeable with the plan outlined above.   [name], MD  Electronically signed by [name], MD

## 2020-09-04 NOTE — H&P (View-Only) (Signed)
HISTORY OF PRESENT ILLNESS:    Beth Rodgers is a 85 y.o.female patient who comes for evaluation of MRI of the breast and for discussion of surgical planning.  Patient was initially found with 2 masses on the left breast.  One of the masses was found with invasive ductal carcinoma and a second mass with invasive level carcinoma.  MRI was recommended.  MRI was done and she was found with left third area of concern under the nipple.  Patient denies any pain.  Patient denies any pain radiation.  There is no alleviating or aggravating factors.  Patient was oriented about this finding of the MRI.      PAST MEDICAL HISTORY:      Past Medical History:  Diagnosis Date   Arthritis    osteoarthritis   Basal cell carcinoma    Glaucoma (increased eye pressure)    Thyroid disease    hypothyroid   Venous disease 06/11/2012   From Clinic Note5/07/2012 She underwent noninvasive venous testing revealing her to have anterior accessory and small saphenous reflux bilaterally.  Her great saphenous veins are missing bilaterally due to surgical stripping, and she has reflux in her deep system as well.  Overall, given her pigmentation and changes consistent of longstanding chronic venous insufficiency, I would like to see if she        PAST SURGICAL HISTORY:        Past Surgical History:  Procedure Laterality Date   BREAST EXCISIONAL BIOPSY     EYE SURGERY Bilateral    cataract   Nasal reconstruction for basal cell carcinoma     Vein Stripping & Ligation           MEDICATIONS:  Encounter Medications        Outpatient Encounter Medications as of 08/29/2020  Medication Sig Dispense Refill   ascorbic acid, vitamin C, 500 mg TbER Take 1 tablet by mouth     betamethasone dipropionate 0.05 % lotion betamethasone dipropionate 0.05 % lotion     betamethasone dipropionate, augmented, (DIPROLENE-AF) 0.05 % cream betamethasone, augmented 0.05 % topical cream     calcium carbonate  600 mg calcium (1,500 mg) Tab tablet Take 1 tablet by mouth     celecoxib (CELEBREX) 200 MG capsule 1 capsule as needed     cholecalciferol (VITAMIN D3) 1,250 mcg (50,000 unit) capsule Take 50,000 Units by mouth every 7 (seven) days     cholecalciferol (VITAMIN D3) 2,000 unit capsule Take 1 capsule by mouth once daily as needed     ciclopirox (LOPROX) 0.77 % cream APPLY 1 APPLICATION ON THE SKIN OF FEET TWICE A DAY     clobetasoL (CORMAX) 0.05 % external solution APPLY 1 ML TO THE SCALP TWICE A DAY     fluticasone propionate (CUTIVATE) 0.05 % cream Apply topically     halobetasol (ULTRAVATE) 0.05 % ointment halobetasol propionate 0.05 % topical ointment     levothyroxine (SYNTHROID) 75 MCG tablet Take 1 tablet by mouth once daily     losartan (COZAAR) 100 MG tablet Take 1 tablet by mouth once daily     multivitamin (MULTIVITAMIN) tablet Take by mouth.     nitroGLYcerin (NITROSTAT) 0.4 MG SL tablet Reported on 12/17/2015     travoprost (TRAVATAN Z) 0.004 % Ophth ophthalmic solution Apply 1 drop to eye nightly     [DISCONTINUED] betamethasone dipropionate 0.05 % lotion APPLY 1 APPLICATION TO THE SCALP TWICE A DAY AS NEEDED FOR FLARE     [DISCONTINUED] cholecalciferol (VITAMIN D3)  1,250 mcg (50,000 unit) capsule cholecalciferol (vitamin D3) 1,250 mcg (50,000 unit) capsule  TAKE 1 CAPSULE BY MOUTH ONE TIME PER WEEK     [DISCONTINUED] cholecalciferol, vitamin D3, 10 mcg (400 unit) Cap Take 1 L by mouth     No facility-administered encounter medications on file as of 08/29/2020.       ALLERGIES:   Patient has no known allergies.   SOCIAL HISTORY:  Social History          Socioeconomic History   Marital status: Married    Spouse name: Not on file   Number of children: Not on file   Years of education: Not on file   Highest education level: Not on file  Occupational History   Not on file  Tobacco Use   Smoking status: Never Smoker    Smokeless tobacco: Never Used  Vaping Use   Vaping Use: Never used  Substance and Sexual Activity   Alcohol use: Not on file   Drug use: Not on file   Sexual activity: Not on file  Other Topics Concern   Not on file  Social History Narrative   Not on file   Social Determinants of Health      Financial Resource Strain:    Difficulty of Paying Living Expenses:   Food Insecurity:    Worried About [place] in the Last Year:    Arboriculturist in the Last Year:   Transportation Needs:    Film/video editor (Medical):    Lack of Transportation (Non-Medical):       FAMILY HISTORY:  History reviewed. No pertinent family history.    PHYSICAL EXAM:     Vitals:   08/29/20 1105  BP: (!) 166/96  Pulse: 83  .  Ht:167.6 cm (5\' 6" ) Wt:74.4 kg (164 lb) ZOX:WRUE surface area is 1.86 meters squared. Body mass index is 26.47 kg/m.Marland Kitchen   GENERAL: Alert, active, oriented x3  BREAST: There is no palpable mass on the left breast.  There is a healing bruise on the left breast.  Bedside ultrasound shows as very small hypoechoic area under the nipple but not enough to be biopsied.  HEART: regular rate and rhythm  LUNGS: clear to auscultation  EXTREMITIES: Well-developed well-nourished symmetrical with no dependent edema.  NEUROLOGICAL: Awake alert oriented, facial expression symmetrical, moving all extremities.      IMPRESSION:     Malignant neoplasm of lower-outer quadrant of left female breast, unspecified estrogen receptor status (CMS-HCC) [C50.512] -Patient with 2 masses on the left breast with confirmation of invasive lobular invasive ductal carcinoma. -Another area of concern was found on the MRI under the nipple of the left breast.  Bedside ultrasound without any easy area to proceed with biopsy.  I will consult radiology for an official ultrasound to see if the lesion can be biopsied.  This will help to plan the surgical management.  If this  lesion is malignant it would be reasonable to discuss with the patient the possibility of mastectomy.  If this lesion is not malignant or it cannot be biopsied before surgery patient will need partial mastectomy of the known malignant masses and plus/minus excisional biopsy of the nipple area.  All these recommendation were discussed with the patient and she is in agreement to proceed with the ultrasound.    No lesion was identified by breast radiologist. Patient oriented about doing excisional biopsy during partial mastectomy. She understand that if final pathology shows cancer of these new  lesion, will need to have another surgery for excision of the nipple areola complex. Patient desire to try to preserve the breast as possible. Patient oriented about risks of surgery.    PLAN:  1. Left breast partial mastectomy and sentinel lymph node biopsy 2. Left breast retroareolar excisional biopsy.   Patient and her husband verbalized understanding, all questions were answered, and were agreeable with the plan outlined above.   [name], MD  Electronically signed by [name], MD

## 2020-09-06 ENCOUNTER — Telehealth: Payer: Self-pay

## 2020-09-06 NOTE — Telephone Encounter (Signed)
Pt left v/m as FYI to Dr Danise Mina; pt is going ahead with lumpectomy at [facility] on 09/11/20; all testing have been done. In v/m pt said lumpectomy is necessary.

## 2020-09-09 ENCOUNTER — Other Ambulatory Visit: Payer: Self-pay

## 2020-09-09 ENCOUNTER — Encounter
Admission: RE | Admit: 2020-09-09 | Discharge: 2020-09-09 | Disposition: A | Discharge status: Home / Self Care | Payer: Medicare Other | Source: Ambulatory Visit | Attending: General Surgery | Admitting: General Surgery

## 2020-09-09 DIAGNOSIS — Z01812 Encounter for preprocedural laboratory examination: Secondary | ICD-10-CM | POA: Insufficient documentation

## 2020-09-09 HISTORY — DX: Essential (primary) hypertension: I10

## 2020-09-09 HISTORY — DX: Hypoglycemia, unspecified: E16.2

## 2020-09-09 HISTORY — DX: Gastro-esophageal reflux disease without esophagitis: K21.9

## 2020-09-09 NOTE — Patient Instructions (Signed)
  Your procedure is scheduled on: 09-11-20 [facility] Report to [facility] @ 7:30 AM  Remember: Instructions that are not followed completely may result in serious medical risk, up to and including death, or upon the discretion of your surgeon and anesthesiologist your surgery may need to be rescheduled.    _x___ 1. Do not eat food after midnight the night before your procedure. NO GUM OR CANDY AFTER MIDNIGHT. You may drink clear liquids up to 2 hours before you are scheduled to arrive at the hospital for your procedure.  Do not drink clear liquids within 2 hours of your scheduled arrival to the hospital.  Clear liquids include  --Water or Apple juice without pulp  --Gatorade  --Black Coffee or Clear Tea (No milk, no creamers, do not add anything to the coffee or Tea-OK TO ADD SUGAR)    __x__ 2. No Alcohol for 24 hours before or after surgery.   __x__3. No Smoking or e-cigarettes for 24 prior to surgery.  Do not use any chewable tobacco products for at least 6 hour prior to surgery   ____  4. Bring all medications with you on the day of surgery if instructed.    __x__ 5. Notify your doctor if there is any change in your medical condition     (cold, fever, infections).    x___6. On the morning of surgery brush your teeth with toothpaste and water.  You may rinse your mouth with mouth wash if you wish.  Do not swallow any toothpaste or mouthwash.   Do not wear jewelry, make-up, hairpins, clips or nail polish.  Do not wear lotions, powders, or perfumes.   Do not shave 48 hours prior to surgery. Men may shave face and neck.  Do not bring valuables to the hospital.    [facility] is not responsible for any belongings or valuables.               Contacts, dentures or bridgework may not be worn into surgery.  Leave your suitcase in the car. After surgery it may be brought to your room.  For patients admitted to the hospital, discharge time is determined by your treatment  team.  _  Patients discharged the day of surgery will not be allowed to drive home.  You will need someone to drive you home and stay with you the night of your procedure.    Please read over the following fact sheets that you were given:   [facility] Preparing for Surgery   _x___ TAKE THE FOLLOWING MEDICATION THE MORNING OF SURGERY WITH A SMALL SIP OF WATER. These include:  1. SYNTHROID (LEVOTHYROXINE)  2.  3.  4.  5.  6.  ____Fleets enema or Magnesium Citrate as directed.   _x___ Use CHG Soap as directed on instruction sheet   ____ Use inhalers on the day of surgery and bring to hospital day of surgery  ____ Stop Metformin and Janumet 2 days prior to surgery.    ____ Take 1/2 of usual insulin dose the night before surgery and none on the morning surgery.   ____ Follow recommendations from Cardiologist, Pulmonologist or PCP regarding stopping Aspirin, Coumadin, Plavix ,Eliquis, Effient, or Pradaxa, and Pletal.  X____Stop Anti-inflammatories such as Advil, Aleve, Ibuprofen, Motrin, Naproxen, Naprosyn, Goodies powders or aspirin products NOW-OK to take Tylenol   _x___ Stop supplements until after surgery-STOP YOUR VITAMIN C NOW-YOU MAY RESUME AFTER SURGERY   ____ Bring C-Pap to the hospital.

## 2020-09-10 ENCOUNTER — Other Ambulatory Visit
Admission: RE | Admit: 2020-09-10 | Discharge: 2020-09-10 | Disposition: A | Discharge status: Home / Self Care | Payer: Medicare Other | Source: Ambulatory Visit | Attending: General Surgery | Admitting: General Surgery

## 2020-09-10 DIAGNOSIS — Z01812 Encounter for preprocedural laboratory examination: Secondary | ICD-10-CM | POA: Diagnosis not present

## 2020-09-10 DIAGNOSIS — Z20822 Contact with and (suspected) exposure to covid-19: Secondary | ICD-10-CM | POA: Diagnosis not present

## 2020-09-11 ENCOUNTER — Encounter: Payer: Self-pay | Admitting: Diagnostic Radiology

## 2020-09-11 ENCOUNTER — Encounter
Admission: RE | Disposition: A | Discharge status: Home / Self Care | Payer: Self-pay | Source: Ambulatory Visit | Attending: General Surgery

## 2020-09-11 ENCOUNTER — Encounter: Payer: Self-pay | Admitting: General Surgery

## 2020-09-11 ENCOUNTER — Other Ambulatory Visit: Payer: Self-pay

## 2020-09-11 ENCOUNTER — Ambulatory Visit
Admission: RE | Admit: 2020-09-11 | Discharge: 2020-09-11 | Disposition: A | Discharge status: Home / Self Care | Payer: Medicare Other | Source: Ambulatory Visit | Attending: General Surgery | Admitting: General Surgery

## 2020-09-11 ENCOUNTER — Ambulatory Visit: Payer: Medicare Other | Admitting: Anesthesiology

## 2020-09-11 DIAGNOSIS — Z79890 Hormone replacement therapy (postmenopausal): Secondary | ICD-10-CM | POA: Insufficient documentation

## 2020-09-11 DIAGNOSIS — Z85828 Personal history of other malignant neoplasm of skin: Secondary | ICD-10-CM | POA: Diagnosis not present

## 2020-09-11 DIAGNOSIS — Z79899 Other long term (current) drug therapy: Secondary | ICD-10-CM | POA: Insufficient documentation

## 2020-09-11 DIAGNOSIS — Z791 Long term (current) use of non-steroidal anti-inflammatories (NSAID): Secondary | ICD-10-CM | POA: Insufficient documentation

## 2020-09-11 DIAGNOSIS — C50812 Malignant neoplasm of overlapping sites of left female breast: Secondary | ICD-10-CM | POA: Diagnosis not present

## 2020-09-11 DIAGNOSIS — Z09 Encounter for follow-up examination after completed treatment for conditions other than malignant neoplasm: Secondary | ICD-10-CM

## 2020-09-11 DIAGNOSIS — I10 Essential (primary) hypertension: Secondary | ICD-10-CM | POA: Diagnosis not present

## 2020-09-11 DIAGNOSIS — I872 Venous insufficiency (chronic) (peripheral): Secondary | ICD-10-CM | POA: Insufficient documentation

## 2020-09-11 DIAGNOSIS — C50512 Malignant neoplasm of lower-outer quadrant of left female breast: Secondary | ICD-10-CM

## 2020-09-11 DIAGNOSIS — C50912 Malignant neoplasm of unspecified site of left female breast: Secondary | ICD-10-CM | POA: Diagnosis not present

## 2020-09-11 DIAGNOSIS — Z170 Estrogen receptor positive status [ER+]: Secondary | ICD-10-CM | POA: Insufficient documentation

## 2020-09-11 DIAGNOSIS — M1990 Unspecified osteoarthritis, unspecified site: Secondary | ICD-10-CM | POA: Diagnosis not present

## 2020-09-11 DIAGNOSIS — H409 Unspecified glaucoma: Secondary | ICD-10-CM | POA: Diagnosis not present

## 2020-09-11 DIAGNOSIS — E039 Hypothyroidism, unspecified: Secondary | ICD-10-CM | POA: Insufficient documentation

## 2020-09-11 HISTORY — PX: PARTIAL MASTECTOMY WITH NEEDLE LOCALIZATION AND AXILLARY SENTINEL LYMPH NODE BX: SHX6009

## 2020-09-11 HISTORY — PX: BREAST LUMPECTOMY: SHX2

## 2020-09-11 HISTORY — PX: EXCISION OF BREAST BIOPSY: SHX5822

## 2020-09-11 LAB — SURGICAL PATHOLOGY

## 2020-09-11 LAB — SARS CORONAVIRUS 2 (TAT 6-24 HRS): SARS Coronavirus 2: NEGATIVE

## 2020-09-11 SURGERY — PARTIAL MASTECTOMY WITH NEEDLE LOCALIZATION AND AXILLARY SENTINEL LYMPH NODE BX
Anesthesia: General | Site: Breast | Laterality: Left

## 2020-09-11 MED ORDER — ORAL CARE MOUTH RINSE: 15.0000 mL | Freq: Once | OROMUCOSAL | Status: AC

## 2020-09-11 MED ORDER — OXYCODONE HCL 5 MG PO TABS
ORAL_TABLET | ORAL | Status: DC
Start: 2020-09-11 — End: 2020-09-11
  Filled 2020-09-11: qty 1

## 2020-09-11 MED ORDER — ACETAMINOPHEN 10 MG/ML IV SOLN
INTRAVENOUS | Status: AC
  Filled 2020-09-11: qty 100

## 2020-09-11 MED ORDER — ACETAMINOPHEN 10 MG/ML IV SOLN
INTRAVENOUS | Status: DC | PRN
  Administered 2020-09-11: 1000 mg via INTRAVENOUS

## 2020-09-11 MED ORDER — MEPERIDINE HCL 50 MG/ML IJ SOLN: 6.2500 mg | INTRAMUSCULAR | Status: DC | PRN

## 2020-09-11 MED ORDER — GLYCOPYRROLATE 0.2 MG/ML IJ SOLN
INTRAMUSCULAR | Status: DC | PRN
  Administered 2020-09-11: .2 mg via INTRAVENOUS

## 2020-09-11 MED ORDER — OXYCODONE HCL 5 MG/5ML PO SOLN: 5.0000 mg | Freq: Once | ORAL | Status: AC | PRN

## 2020-09-11 MED ORDER — FENTANYL CITRATE (PF) 100 MCG/2ML IJ SOLN
INTRAMUSCULAR | Status: DC | PRN
Start: 2020-09-11 — End: 2020-09-11
  Administered 2020-09-11 (×4): 25 ug via INTRAVENOUS

## 2020-09-11 MED ORDER — HYDROCODONE-ACETAMINOPHEN 5-325 MG PO TABS
1.0000 | ORAL_TABLET | ORAL | 0 refills | Status: AC | PRN
Start: 2020-09-11 — End: 2020-09-14

## 2020-09-11 MED ORDER — OXYCODONE HCL 5 MG PO TABS
5.0000 mg | ORAL_TABLET | Freq: Once | ORAL | Status: AC | PRN
  Administered 2020-09-11: 5 mg via ORAL

## 2020-09-11 MED ORDER — BUPIVACAINE-EPINEPHRINE (PF) 0.5% -1:200000 IJ SOLN
INTRAMUSCULAR | Status: AC
  Filled 2020-09-11: qty 30

## 2020-09-11 MED ORDER — LIDOCAINE HCL (CARDIAC) PF 100 MG/5ML IV SOSY
PREFILLED_SYRINGE | INTRAVENOUS | Status: DC | PRN
  Administered 2020-09-11: 80 mg via INTRAVENOUS

## 2020-09-11 MED ORDER — CHLORHEXIDINE GLUCONATE 0.12 % MT SOLN: 15.0000 mL | Freq: Once | OROMUCOSAL | Status: AC

## 2020-09-11 MED ORDER — FENTANYL CITRATE (PF) 100 MCG/2ML IJ SOLN
INTRAMUSCULAR | Status: AC
  Filled 2020-09-11: qty 2

## 2020-09-11 MED ORDER — TECHNETIUM TC 99M SULFUR COLLOID FILTERED
1.0000 | Freq: Once | INTRAVENOUS | Status: AC | PRN
  Administered 2020-09-11: 0.623 via INTRADERMAL

## 2020-09-11 MED ORDER — CEFAZOLIN SODIUM-DEXTROSE 2-4 GM/100ML-% IV SOLN
2.0000 g | INTRAVENOUS | Status: AC
  Administered 2020-09-11: 2 g via INTRAVENOUS

## 2020-09-11 MED ORDER — KETOROLAC TROMETHAMINE 30 MG/ML IJ SOLN
INTRAMUSCULAR | Status: DC | PRN
  Administered 2020-09-11: 15 mg via INTRAVENOUS

## 2020-09-11 MED ORDER — PROMETHAZINE HCL 25 MG/ML IJ SOLN: 6.2500 mg | INTRAMUSCULAR | Status: DC | PRN

## 2020-09-11 MED ORDER — HYDROMORPHONE HCL 1 MG/ML IJ SOLN
INTRAMUSCULAR | Status: AC
  Filled 2020-09-11: qty 1

## 2020-09-11 MED ORDER — FENTANYL CITRATE (PF) 100 MCG/2ML IJ SOLN
25.0000 ug | INTRAMUSCULAR | Status: DC | PRN
  Administered 2020-09-11 (×3): 25 ug via INTRAVENOUS

## 2020-09-11 MED ORDER — FENTANYL CITRATE (PF) 100 MCG/2ML IJ SOLN
INTRAMUSCULAR | Status: AC
  Administered 2020-09-11: 25 ug via INTRAVENOUS
  Filled 2020-09-11: qty 2

## 2020-09-11 MED ORDER — FAMOTIDINE 20 MG PO TABS: 20.0000 mg | ORAL_TABLET | Freq: Once | ORAL | Status: AC

## 2020-09-11 MED ORDER — LACTATED RINGERS IV SOLN: INTRAVENOUS | Status: DC

## 2020-09-11 MED ORDER — CHLORHEXIDINE GLUCONATE 0.12 % MT SOLN
OROMUCOSAL | Status: AC
  Administered 2020-09-11: 15 mL via OROMUCOSAL
  Filled 2020-09-11: qty 15

## 2020-09-11 MED ORDER — PHENYLEPHRINE HCL (PRESSORS) 10 MG/ML IV SOLN
INTRAVENOUS | Status: DC | PRN
  Administered 2020-09-11: 100 ug via INTRAVENOUS

## 2020-09-11 MED ORDER — METHYLENE BLUE 0.5 % INJ SOLN
INTRAVENOUS | Status: AC
  Filled 2020-09-11: qty 10

## 2020-09-11 MED ORDER — HYDROMORPHONE HCL 1 MG/ML IJ SOLN
INTRAMUSCULAR | Status: DC | PRN
  Administered 2020-09-11 (×3): .1 mg via INTRAVENOUS
  Administered 2020-09-11: .2 mg via INTRAVENOUS
  Administered 2020-09-11: .1 mg via INTRAVENOUS

## 2020-09-11 MED ORDER — EPHEDRINE SULFATE 50 MG/ML IJ SOLN
INTRAMUSCULAR | Status: DC | PRN
  Administered 2020-09-11: 5 mg via INTRAVENOUS
  Administered 2020-09-11 (×2): 10 mg via INTRAVENOUS

## 2020-09-11 MED ORDER — CEFAZOLIN SODIUM-DEXTROSE 2-4 GM/100ML-% IV SOLN
INTRAVENOUS | Status: AC
  Filled 2020-09-11: qty 100

## 2020-09-11 MED ORDER — FAMOTIDINE 20 MG PO TABS
ORAL_TABLET | ORAL | Status: AC
  Administered 2020-09-11: 20 mg via ORAL
  Filled 2020-09-11: qty 1

## 2020-09-11 MED ORDER — PROPOFOL 10 MG/ML IV BOLUS
INTRAVENOUS | Status: AC
  Filled 2020-09-11: qty 20

## 2020-09-11 MED ORDER — PROPOFOL 10 MG/ML IV BOLUS
INTRAVENOUS | Status: DC | PRN
  Administered 2020-09-11: 80 mg via INTRAVENOUS
  Administered 2020-09-11: 120 mg via INTRAVENOUS

## 2020-09-11 MED ORDER — LIDOCAINE HCL (PF) 2 % IJ SOLN
INTRAMUSCULAR | Status: AC
  Filled 2020-09-11: qty 5

## 2020-09-11 MED ORDER — BUPIVACAINE-EPINEPHRINE (PF) 0.5% -1:200000 IJ SOLN
INTRAMUSCULAR | Status: DC | PRN
  Administered 2020-09-11: 30 mL

## 2020-09-11 MED ORDER — METHYLENE BLUE 0.5 % INJ SOLN
INTRAVENOUS | Status: DC | PRN
  Administered 2020-09-11: 10 mL via SUBMUCOSAL

## 2020-09-11 MED ORDER — ONDANSETRON HCL 4 MG/2ML IJ SOLN
INTRAMUSCULAR | Status: DC | PRN
  Administered 2020-09-11: 4 mg via INTRAVENOUS

## 2020-09-11 SURGICAL SUPPLY — 50 items
ADH SKN CLS APL DERMABOND .7 (GAUZE/BANDAGES/DRESSINGS) ×1
APL PRP STRL LF DISP 70% ISPRP (MISCELLANEOUS) ×1
BINDER BREAST MEDIUM (GAUZE/BANDAGES/DRESSINGS) IMPLANT
BINDER BREAST XLRG (GAUZE/BANDAGES/DRESSINGS) IMPLANT
BINDER BREAST XXLRG (GAUZE/BANDAGES/DRESSINGS) IMPLANT
BLADE SURG 15 STRL LF DISP TIS (BLADE) ×2 IMPLANT
BLADE SURG 15 STRL SS (BLADE) ×6
CANISTER SUCT 1200ML W/VALVE (MISCELLANEOUS) ×3 IMPLANT
CHLORAPREP W/TINT 26 (MISCELLANEOUS) ×3 IMPLANT
CNTNR SPEC 2.5X3XGRAD LEK (MISCELLANEOUS)
CONT SPEC 4OZ STER OR WHT (MISCELLANEOUS)
CONT SPEC 4OZ STRL OR WHT (MISCELLANEOUS)
CONTAINER SPEC 2.5X3XGRAD LEK (MISCELLANEOUS) IMPLANT
COVER WAND RF STERILE (DRAPES) ×3 IMPLANT
DERMABOND ADVANCED (GAUZE/BANDAGES/DRESSINGS) ×2
DERMABOND ADVANCED .7 DNX12 (GAUZE/BANDAGES/DRESSINGS) ×1 IMPLANT
DEVICE DUBIN SPECIMEN MAMMOGRA (MISCELLANEOUS) ×3 IMPLANT
DRAPE LAPAROTOMY TRNSV 106X77 (MISCELLANEOUS) ×3 IMPLANT
DRSG GAUZE FLUFF 36X18 (GAUZE/BANDAGES/DRESSINGS) IMPLANT
ELECT CAUTERY BLADE 6.4 (BLADE) ×3 IMPLANT
ELECT REM PT RETURN 9FT ADLT (ELECTROSURGICAL) ×3
ELECTRODE REM PT RTRN 9FT ADLT (ELECTROSURGICAL) ×1 IMPLANT
GLOVE BIO SURGEON STRL SZ 6.5 (GLOVE) ×2 IMPLANT
GLOVE BIO SURGEONS STRL SZ 6.5 (GLOVE) ×1
GLOVE BIOGEL PI IND STRL 6.5 (GLOVE) ×1 IMPLANT
GLOVE BIOGEL PI INDICATOR 6.5 (GLOVE) ×2
GOWN STRL REUS W/ TWL LRG LVL3 (GOWN DISPOSABLE) ×2 IMPLANT
GOWN STRL REUS W/TWL LRG LVL3 (GOWN DISPOSABLE) ×6
KIT MARKER MARGIN INK (KITS) ×3 IMPLANT
KIT TURNOVER KIT A (KITS) ×3 IMPLANT
LABEL OR SOLS (LABEL) ×3 IMPLANT
MARGIN MAP 10MM (MISCELLANEOUS) ×3 IMPLANT
MARKER MARGIN CORRECT CLIP (MARKER) ×3 IMPLANT
NEEDLE HYPO 22GX1.5 SAFETY (NEEDLE) ×3 IMPLANT
NEEDLE HYPO 25X1 1.5 SAFETY (NEEDLE) ×3 IMPLANT
PACK BASIN MINOR (MISCELLANEOUS) ×3 IMPLANT
RETRACTOR RING XSMALL (MISCELLANEOUS) ×1 IMPLANT
RTRCTR WOUND ALEXIS 13CM XS SH (MISCELLANEOUS) ×3
SLEVE PROBE SENORX GAMMA FIND (MISCELLANEOUS) ×3 IMPLANT
SUT ETHILON 3-0 FS-10 30 BLK (SUTURE) ×3
SUT MNCRL 4-0 (SUTURE) ×6
SUT MNCRL 4-0 27XMFL (SUTURE) ×2
SUT SILK 2 0 SH (SUTURE) ×3 IMPLANT
SUT VIC AB 3-0 SH 27 (SUTURE) ×6
SUT VIC AB 3-0 SH 27X BRD (SUTURE) ×2 IMPLANT
SUTURE EHLN 3-0 FS-10 30 BLK (SUTURE) ×1 IMPLANT
SUTURE MNCRL 4-0 27XMF (SUTURE) ×2 IMPLANT
SYR 10ML LL (SYRINGE) ×6 IMPLANT
SYR BULB IRRIG 60ML STRL (SYRINGE) ×3 IMPLANT
WATER STERILE IRR 1000ML POUR (IV SOLUTION) ×3 IMPLANT

## 2020-09-11 NOTE — Op Note (Addendum)
Preoperative diagnosis: Left breast carcinoma.                                             Left breast retroareolar mass  Postoperative diagnosis: Left breast carcinoma.                                              Left breast retroareolar mass.   Procedure: Left radiofrequency tag-localized partial mastectomy.                       Left Axillary Sentinel Lymph node biopsy           Left breast retroareolar excisional biopsy                      Left breast tissue transfer  Anesthesia: GETA  Surgeon: Dr. Windell Moment  Wound Classification: Clean  Indications: Patient is a 85 y.o. female with a nonpalpable two left breast mass noted on mammography on the lower aspect of the breast with core biopsy demonstrating invasive lobular and invasive ductal carcinoma requires radiofrequency tag-localized partial mastectomy for treatment with sentinel lymph node biopsy. There was an additional mass on the retroareolar space identified on breast MRI unable to be biopsied due to the location.   Findings: 1. Specimen mammography shows marker and wire on specimen 2. Pathology call refers gross examination of margins was negative 3. No other palpable mass or lymph node identified.   Description of procedure: Preoperative radiofrequency tag localization was performed by radiology. In the nuclear medicine suite, the subareolar region was injected with Tc-99 sulfur colloid. Localization studies were reviewed. The patient was taken to the operating room and placed supine on the operating table, and after general anesthesia the left chest and axilla were prepped and draped in the usual sterile fashion. I personally injected methylene blue for intra operative mapping of axillary lymph nodes. A time-out was completed verifying correct patient, procedure, site, positioning, and implant(s) and/or special equipment prior to beginning this procedure.  By comparing the localization studies and interrogation with  Localizer device, the probable trajectory and location of the mass was visualized. A circumareolar skin incision was planned in such a way as to minimize the amount of dissection to reach the mass.  The skin incision was made. Flaps were raised and the location of the tag was confirmed with Localizer device confirmed. A 2-0 silk figure-of-eight stay suture was placed and used for retraction. Dissection was then taken down circumferentially, taking care to include the entire localizing tag and a wide margin of grossly normal tissue. The specimen and entire localizing tag were removed. The specimen was oriented and sent to radiology with the localization studies. Confirmation was received that the entire target lesion had been resected. The wound was irrigated. Hemostasis was checked.    A hand-held gamma probe was used to identify the location of the hottest spot in the axilla. An incision was made around the caudal axillary hairline. Dissection was carried down until subdermal facias was advanced. The probe was placed and again, the point of maximal count was found. Dissection continue until nodule was identified. The probe was placed in contact with the node. The node was excised in its  entirety.  An additional hot spot was detected and the node was excised in similar fashion. No additional hot spots were identified. No clinically abnormal nodes were palpated. The procedure was terminated. Hemostasis was achieved and the wound closed in layers with deep interrupted 3-0 Vicryl and skin was closed with subcuticular suture of Monocryl 3-0.   Dissection was then extended to the areola complex. The tissue under the nipple areola complex was excised. The specimen was oriented and sent to pathology.   The breast wound was then approached for closure.  Eliminate dead space a tissue transfer technique was utilized.  The breast and pectoralis fascia was elevated off the underlying muscle and the serratus muscle  circumferentially for a distance of about 8 cm x 6 cm (48 sq cm).  The fascial layer was then approximated with interrupted 2-0 Vicryl sutures.  The superficial layer of the breast parenchyma was then approximated in a similar fashion.  This was done in a radial direction.  The skin flaps were then elevated circumferentially to remove a ripple noted superiorly and medially. The skin was closed with 4-0 Monocryl. Dermabond was applied.  Specimen: Left Breast mass                     Sentinel Lymph nodes #1                    Left breast retroareolar complex.   Complications: None  Estimated Blood Loss: 5 mL

## 2020-09-11 NOTE — Anesthesia Preprocedure Evaluation (Signed)
Anesthesia Evaluation  Patient identified by MRN, date of birth, ID band Patient awake    Reviewed: Allergy & Precautions, NPO status , Patient's Chart, lab work & pertinent test results  History of Anesthesia Complications Negative for: history of anesthetic complications  Airway Mallampati: II  TM Distance: >3 FB Neck ROM: Full    Dental no notable dental hx.    Pulmonary neg pulmonary ROS, neg sleep apnea, neg COPD,    breath sounds clear to auscultation- rhonchi (-) wheezing      Cardiovascular hypertension, Pt. on medications (-) CAD, (-) Past MI, (-) Cardiac Stents and (-) CABG  Rhythm:Regular Rate:Normal - Systolic murmurs and - Diastolic murmurs    Neuro/Psych neg Seizures PSYCHIATRIC DISORDERS Depression negative neurological ROS     GI/Hepatic Neg liver ROS, GERD  ,  Endo/Other  neg diabetesHypothyroidism   Renal/GU negative Renal ROS     Musculoskeletal  (+) Arthritis ,   Abdominal (+) - obese,   Peds  Hematology negative hematology ROS (+)   Anesthesia Other Findings Past Medical History: No date: Basal cell carcinoma (BCC) 08/10/2018: Cellulitis No date: Chronic venous insufficiency     Comment:  with varicose veins No date: GERD (gastroesophageal reflux disease) No date: Glaucoma No date: History  of basal cell carcinoma     Comment:  left nare/BREAST CANCER No date: History of chicken pox No date: Hypertension No date: Hypoglycemia No date: Hypothyroid No date: Osteoarthritis     Comment:  back pain, L knee pain 06/2009: Osteopenia     Comment:  DEXA 11/2015: T -2.3 hip, -2.2 spine, on longterm               bisphosphonate/evista 04/16/2016: Pyogenic granuloma   Reproductive/Obstetrics                             Anesthesia Physical Anesthesia Plan  ASA: II  Anesthesia Plan: General   Post-op Pain Management:    Induction: Intravenous  PONV Risk Score and  Plan: 2  Airway Management Planned: LMA  Additional Equipment:   Intra-op Plan:   Post-operative Plan:   Informed Consent: I have reviewed the patients History and Physical, chart, labs and discussed the procedure including the risks, benefits and alternatives for the proposed anesthesia with the patient or authorized representative who has indicated his/her understanding and acceptance.     Dental advisory given  Plan Discussed with: CRNA and Anesthesiologist  Anesthesia Plan Comments:         Anesthesia Quick Evaluation

## 2020-09-11 NOTE — Discharge Instructions (Signed)
°  Diet: Resume home heart healthy regular diet.  ° °Activity: Increase activity as tolerated. Light activity and walking are encouraged. Do not drive or drink alcohol if taking narcotic pain medications. ° °Wound care: May shower with soapy water and pat dry (do not rub incisions), but no baths or submerging incision underwater until follow-up. (no swimming)  ° °Medications: Resume all home medications. For mild to moderate pain: acetaminophen (Tylenol) or ibuprofen (if no kidney disease). Combining Tylenol with alcohol can substantially increase your risk of causing liver disease. Narcotic pain medications, if prescribed, can be used for severe pain, though may cause nausea, constipation, and drowsiness. Do not combine Tylenol and Norco within a 6 hour period as Norco contains Tylenol. If you do not need the narcotic pain medication, you do not need to fill the prescription. ° °Call office ([phone]) at any time if any questions, worsening pain, fevers/chills, bleeding, drainage from incision site, or other concerns. ° °AMBULATORY SURGERY  °DISCHARGE INSTRUCTIONS ° ° °1) The drugs that you were given will stay in your system until tomorrow so for the next 24 hours you should not: ° °A) Drive an automobile °B) Make any legal decisions °C) Drink any alcoholic beverage ° ° °2) You may resume regular meals tomorrow.  Today it is better to start with liquids and gradually work up to solid foods. ° °You may eat anything you prefer, but it is better to start with liquids, then soup and crackers, and gradually work up to solid foods. ° ° °3) Please notify your doctor immediately if you have any unusual bleeding, trouble breathing, redness and pain at the surgery site, drainage, fever, or pain not relieved by medication. ° ° ° °4) Additional Instructions: ° ° ° ° ° ° ° °Please contact your physician with any problems or Same Day Surgery at [phone], Monday through Friday 6 am to 4 pm, or [facility]  number at [phone]. °

## 2020-09-11 NOTE — Interval H&P Note (Signed)
History and Physical Interval Note:  09/11/2020 9:40 AM  Beth Rodgers  has presented today for surgery, with the diagnosis of C50.512 Malignant neoplasm of lower outer quadrant of lt female breast, unspecified estrogen receptor status.  The various methods of treatment have been discussed with the patient and family. After consideration of risks, benefits and other options for treatment, the patient has consented to  Procedure(s): PARTIAL MASTECTOMY WITH NEEDLE LOCALIZATION AND AXILLARY SENTINEL LYMPH NODE BX (Left) EXCISION OF BREAST BIOPSY (Left) as a surgical intervention.  The patient's history has been reviewed, patient examined, no change in status, stable for surgery.  I have reviewed the patient's chart and labs.  Left breast marked in the pre procedure room. Questions were answered to the patient's satisfaction.     [name]

## 2020-09-11 NOTE — Transfer of Care (Signed)
Immediate Anesthesia Transfer of Care Note  Patient: Beth Rodgers  Procedure(s) Performed: PARTIAL MASTECTOMY WITH NEEDLE LOCALIZATION AND AXILLARY SENTINEL LYMPH NODE BX (Left Breast) EXCISION OF BREAST BIOPSY (Left Breast)  Patient Location: PACU  Anesthesia Type:General  Level of Consciousness: awake and drowsy  Airway & Oxygen Therapy: Patient Spontanous Breathing and Patient connected to face mask oxygen  Post-op Assessment: Report given to RN and Post -op Vital signs reviewed and stable  Post vital signs: Reviewed and stable  Last Vitals:  Vitals Value Taken Time  BP 113/72 09/11/20 1144  Temp    Pulse 94 09/11/20 1144  Resp 21 09/11/20 1144  SpO2 98 % 09/11/20 1144  Vitals shown include unvalidated device data.  Last Pain:  Vitals:   09/11/20 0918  PainSc: 0-No pain         Complications: No complications documented.

## 2020-09-11 NOTE — Anesthesia Postprocedure Evaluation (Signed)
Anesthesia Post Note  Patient: Beth Rodgers  Procedure(s) Performed: PARTIAL MASTECTOMY WITH NEEDLE LOCALIZATION AND AXILLARY SENTINEL LYMPH NODE BX (Left Breast) EXCISION OF BREAST BIOPSY (Left Breast)  Patient location during evaluation: PACU Anesthesia Type: General Level of consciousness: awake and alert and oriented Pain management: pain level controlled Vital Signs Assessment: post-procedure vital signs reviewed and stable Respiratory status: spontaneous breathing, nonlabored ventilation and respiratory function stable Cardiovascular status: blood pressure returned to baseline and stable Postop Assessment: no signs of nausea or vomiting Anesthetic complications: no   No complications documented.   Last Vitals:  Vitals:   09/11/20 1214 09/11/20 1229  BP: 126/72 116/72  Pulse: 79 75  Resp: 16 13  Temp:    SpO2: 94% 97%    Last Pain:  Vitals:   09/11/20 1229  PainSc: 3                  Taetum Flewellen

## 2020-09-11 NOTE — Anesthesia Procedure Notes (Signed)
Procedure Name: LMA Insertion Date/Time: 09/11/2020 9:55 AM Performed by: Natasha Mead, CRNA Pre-anesthesia Checklist: Patient identified, Emergency Drugs available, Suction available, Patient being monitored and Timeout performed Patient Re-evaluated:Patient Re-evaluated prior to induction Oxygen Delivery Method: Circle system utilized Preoxygenation: Pre-oxygenation with 100% oxygen Induction Type: IV induction Ventilation: Mask ventilation without difficulty LMA: LMA inserted LMA Size: 3.0 Number of attempts: 2 (3.5 wouldnt seal well, switched to size 3 of other brand) Dental Injury: Teeth and Oropharynx as per pre-operative assessment

## 2020-09-12 ENCOUNTER — Encounter: Payer: Self-pay | Admitting: General Surgery

## 2020-09-12 ENCOUNTER — Telehealth: Payer: Self-pay

## 2020-09-12 NOTE — Telephone Encounter (Signed)
Pt left a message on triage line to let Dr Darnell Level know she had her lumpectomy yesterday and she is home and is doing well.

## 2020-09-16 ENCOUNTER — Encounter: Payer: Self-pay | Admitting: *Deleted

## 2020-09-16 NOTE — Progress Notes (Signed)
Called patient to follow up post surgery.  States she is "recovering slowly".  States she is having "oozing" from the surgical site and having to use a little pad to collect the drainage.  Describes oozing as "not really bloody, but with some blood".  Does state redness at the surgical site.  Denies fever, but states she has had some chills.  States she has not called Dr. Deniece Ree office.  I have left Beth Rodgers a message, but she is out of the office. Called Dr. Deniece Ree office and I have scheduled her an appt for tomorrow at 11:35.

## 2020-09-17 ENCOUNTER — Ambulatory Visit (INDEPENDENT_AMBULATORY_CARE_PROVIDER_SITE_OTHER): Payer: Medicare Other | Admitting: Psychology

## 2020-09-17 ENCOUNTER — Other Ambulatory Visit: Payer: Self-pay | Admitting: Pathology

## 2020-09-17 DIAGNOSIS — F4323 Adjustment disorder with mixed anxiety and depressed mood: Secondary | ICD-10-CM | POA: Diagnosis not present

## 2020-09-17 LAB — SURGICAL PATHOLOGY

## 2020-09-26 ENCOUNTER — Encounter: Payer: Self-pay | Admitting: Oncology

## 2020-09-26 ENCOUNTER — Inpatient Hospital Stay: Payer: Medicare Other | Attending: Oncology | Admitting: Oncology

## 2020-09-26 ENCOUNTER — Other Ambulatory Visit: Payer: Self-pay

## 2020-09-26 VITALS — BP 129/81 | HR 80 | Temp 98.2°F | Resp 18 | Wt 176.1 lb

## 2020-09-26 DIAGNOSIS — D0512 Intraductal carcinoma in situ of left breast: Secondary | ICD-10-CM

## 2020-09-26 DIAGNOSIS — Z170 Estrogen receptor positive status [ER+]: Secondary | ICD-10-CM | POA: Diagnosis not present

## 2020-09-26 DIAGNOSIS — C50812 Malignant neoplasm of overlapping sites of left female breast: Secondary | ICD-10-CM | POA: Insufficient documentation

## 2020-09-26 DIAGNOSIS — Z809 Family history of malignant neoplasm, unspecified: Secondary | ICD-10-CM

## 2020-09-26 DIAGNOSIS — Z9012 Acquired absence of left breast and nipple: Secondary | ICD-10-CM | POA: Diagnosis not present

## 2020-09-26 DIAGNOSIS — Z803 Family history of malignant neoplasm of breast: Secondary | ICD-10-CM | POA: Insufficient documentation

## 2020-09-26 NOTE — Progress Notes (Signed)
Hematology/Oncology Consult note [facility] Telephone:([phone] [phone]   Patient Care Team: Ria Bush, MD as PCP - General (Family Medicine) Rico Junker, RN as Registered Nurse  REFERRING PROVIDER: Ria Bush, MD  CHIEF COMPLAINTS/REASON FOR VISIT:  Evaluation of breast cancer   HISTORY OF PRESENTING ILLNESS:   Beth Rodgers is a  85 y.o.  female with PMH listed below was seen in consultation at the request of  Ria Bush, MD  for evaluation of breast cancer Patient self palpated left breast mass in August 2021. 8/11/2021Bilateral diagnostic mammogram showed 2 adjacent suspicious mass with interspersed calcifications in the left breast at the 4:00 and 6:00 respectively.  Together the masses span 4.5 cm on ultrasound.  2 indeterminate lymph nodes in the left axilla.  No evidence of malignancy in the right breast.  Patient underwent ultrasound biopsy of the 2 left breast masses and one of the thickened left axillary lymph node. Pathology showed Left breast 4:00, grade 1 invasive mammary carcinoma no special type. Left breast 6:00, grade 1, invasive mammary carcinoma, lobular features. Left axillary lymph node negative for malignancy.  Patient denies any nipple discharge, skin changes.  She has had previous breast biopsy many years ago and she cannot remember details.  She cannot remember which breast. Patient has 2 children.  Age at first birth 97/32 Menopause at age about 53 OCP use for 74 or more years. Remote breast biopsy about 20 years ago.  Family history positive for first cousin with breast cancer, mother with thyroid cancer.   INTERVAL HISTORY Beth Rodgers is a 85 y.o. female who has above history reviewed by me today presents for follow up visit for management of breast cancer Problems and complaints are listed below: 08/27/2020 MRI breast bilaterally showed 2 irregular enhancing masses within the left  breast compatible with biopsy-proven malignancy.  There is a large area of non-mass enhancement predominantly between the 2 masses concerning for associated disease.  There is also an indeterminate retroareolar enhancing left breast mass.  #09/11/2020 patient underwent left breast partial mastectomy, sentinel lymph node biopsy of the left axillary, left retroareolar breast mass excisional biopsy Pathology showed Left breast invasive mammary carcinoma multifocal, pT1c pN0, ER/PR positive, HER-2 negative, grade 1, DCIS present,-LVI,  Margin status is negative for invasive carcinoma.  Positive for DCIS in the posterior and superior margin. Left retroareolar excisional biopsy showed high-grade comedo DCIS.  Margin is positive for superior, posterior, anterior, medial margin.  pTis pN0  Patient has seen surgery postop and discussed about additional surgical plan.  Total mastectomy was discussed with her and the patient was very hesitant at the moment.  She presented to follow-up with me for further discussion.    Review of Systems  Constitutional: Negative for appetite change, chills, fatigue and fever.  HENT:   Negative for hearing loss and voice change.   Eyes: Negative for eye problems.  Respiratory: Negative for chest tightness and cough.   Cardiovascular: Negative for chest pain.  Gastrointestinal: Negative for abdominal distention, abdominal pain and blood in stool.  Endocrine: Negative for hot flashes.  Genitourinary: Negative for difficulty urinating and frequency.   Musculoskeletal: Negative for arthralgias.  Skin: Negative for itching and rash.  Neurological: Negative for extremity weakness.  Hematological: Negative for adenopathy.  Psychiatric/Behavioral: Negative for confusion.  Status post left breast lumpectomy with sentinel lymph node biopsy.  Denies any concerns of surgical site.  MEDICAL HISTORY:  Past Medical History:  Diagnosis Date  .  Basal cell carcinoma (BCC)   .  Cellulitis 08/10/2018  . Chronic venous insufficiency    with varicose veins  . GERD (gastroesophageal reflux disease)   . Glaucoma   . History  of basal cell carcinoma    left nare/BREAST CANCER  . History of chicken pox   . Hypertension   . Hypoglycemia   . Hypothyroid   . Osteoarthritis    back pain, L knee pain  . Osteopenia 06/2009   DEXA 11/2015: T -2.3 hip, -2.2 spine, on longterm bisphosphonate/evista  . Pyogenic granuloma 04/16/2016    SURGICAL HISTORY: Past Surgical History:  Procedure Laterality Date  . BASAL CELL CARCINOMA EXCISION     located on face  . BREAST BIOPSY Left    core done ing [place]?  [facility] BREAST BIOPSY Left 08/16/2020   3 area bx 4:00 X, IMC, 6:00Q IMC, LN hydro #3-benign  . BREAST LUMPECTOMY Left 09/11/2020   two areas of [facility]  . CATARACT EXTRACTION     bilateral  . DEXA  06/2009   T score Spine: -1.8, hip -2.0  . EXCISION OF BREAST BIOPSY Left 09/11/2020   Procedure: EXCISION OF BREAST BIOPSY;  Surgeon: [name], MD;  Location: [facility] ORS;  Service: General;  Laterality: Left;  . Foam sclerotherapy Bilateral 09/2015   [place], [place]  . NASAL RECONSTRUCTION  2005   for BCC L nare s/p Mohs  . nuclear stress test  2014   normal in [place]  . PARTIAL MASTECTOMY WITH NEEDLE LOCALIZATION AND AXILLARY SENTINEL LYMPH NODE BX Left 09/11/2020   Procedure: PARTIAL MASTECTOMY WITH NEEDLE LOCALIZATION AND AXILLARY SENTINEL LYMPH NODE BX;  Surgeon: [name], MD;  Location: [facility] ORS;  Service: General;  Laterality: Left;  . RADIOFREQUENCY ABLATION Left 01/2012   remnant L G saphenous vein below knee  . RADIOFREQUENCY ABLATION Right 02/2013   RLE vein ablation   . VEIN LIGATION AND STRIPPING  remote   bilateral G saphenous veins    SOCIAL HISTORY: Social History   Socioeconomic History  . Marital status: Married    Spouse name: Not on file  . Number of children: 2  . Years of education: Not on file  . Highest education  level: Not on file  Occupational History    Employer: RETIRED  Tobacco Use  . Smoking status: Never Smoker  . Smokeless tobacco: Never Used  Vaping Use  . Vaping Use: Never used  Substance and Sexual Activity  . Alcohol use: Yes    Comment: Occasionally drinks wine  . Drug use: No  . Sexual activity: Not Currently    Birth control/protection: Post-menopausal  Other Topics Concern  . Not on file  Social History Narrative   From [place], [place]   Caffeine: 2-3 cups/day   Lives with husband, majority of time alone as he travels to [place].   Has grandchildren nearby.   Occ: Field seismologist, Metallurgist   Activity: bed exercises, limiting walking   Diet: does get fruits and vegetables, only some water   Social Determinants of Health   Financial Resource Strain: Low Risk   . Difficulty of Paying Living Expenses: Not hard at all  Food Insecurity: No Food Insecurity  . Worried About Charity fundraiser in the Last Year: Never true  . Ran Out of Food in the Last Year: Never true  Transportation Needs: No Transportation Needs  . Lack of Transportation (Medical): No  . Lack of Transportation (Non-Medical): No  Physical Activity: Sufficiently  Active  . Days of Exercise per Week: 7 days  . Minutes of Exercise per Session: 40 min  Stress: No Stress Concern Present  . Feeling of Stress : Not at all  Social Connections:   . Frequency of Communication with Friends and Family: Not on file  . Frequency of Social Gatherings with Friends and Family: Not on file  . Attends Religious Services: Not on file  . Active Member of Clubs or Organizations: Not on file  . Attends Archivist Meetings: Not on file  . Marital Status: Not on file  Intimate Partner Violence: Not At Risk  . Fear of Current or Ex-Partner: No  . Emotionally Abused: No  . Physically Abused: No  . Sexually Abused: No    FAMILY HISTORY: Family History  Problem Relation Age of Onset  . Cancer Mother         thyroid cancer  . Heart disease Father        CHF  . Diabetes Father   . Glaucoma Father   . Arthritis Father   . Coronary artery disease Maternal Uncle   . Bipolar disorder Son   . Heart disease Sister        CHF  . Stroke Sister   . Breast cancer Neg Hx     ALLERGIES:  has No Known Allergies.  MEDICATIONS:  Current Outpatient Medications  Medication Sig Dispense Refill  . desonide (DESOWEN) 0.05 % cream Apply 1 application topically 3 (three) times a week.    . fluticasone (CUTIVATE) 0.05 % cream Apply topically daily. For max 2 wks at a time (Patient taking differently: Apply 1 application topically daily as needed (toe fungus). For max 2 wks at a time) 30 g 0  . halobetasol (ULTRAVATE) 0.05 % ointment Apply 1 application topically 4 (four) times a week.   1  . levothyroxine (SYNTHROID) 75 MCG tablet Take 1 tablet (75 mcg total) by mouth daily. (Patient taking differently: Take 75 mcg by mouth daily before breakfast. ) 90 tablet 3  . losartan (COZAAR) 100 MG tablet Take 1 tablet (100 mg total) by mouth daily. (Patient taking differently: Take 100 mg by mouth at bedtime. ) 90 tablet 3  . NITROSTAT 0.4 MG SL tablet Place 0.4 mg under the tongue every 5 (five) minutes as needed for chest pain.     . silver sulfADIAZINE (SILVADENE) 1 % cream Apply 1 application topically daily.     . Travoprost, BAK Free, (TRAVATAN) 0.004 % SOLN ophthalmic solution Place 1 drop into both eyes at bedtime.    . Ascorbic Acid (VITAMIN C CR PO) Take 500 mg by mouth daily.  (Patient not taking: Reported on 09/26/2020)    . calcium carbonate (TUMS EX) 750 MG chewable tablet Chew 1 tablet by mouth as needed for heartburn. (Patient not taking: Reported on 09/26/2020)    . Cholecalciferol (VITAMIN D) 50 MCG (2000 UT) CAPS Take 1 capsule (2,000 Units total) by mouth daily. (Patient not taking: Reported on 09/26/2020) 30 capsule   . Crisaborole (EUCRISA) 2 % OINT Apply 1 application topically daily as needed (Sore  on legs). (Patient not taking: Reported on 09/11/2020)    . diclofenac sodium (VOLTAREN) 1 % GEL Apply 1 application topically 3 (three) times daily. (Patient not taking: Reported on 09/26/2020) 1 Tube 0  . Multiple Vitamin (MULTIVITAMIN) tablet Take 1 tablet by mouth daily.  (Patient not taking: Reported on 09/26/2020)     No current facility-administered medications for this  visit.     PHYSICAL EXAMINATION: ECOG PERFORMANCE STATUS: 0 - Asymptomatic Vitals:   09/26/20 1101  BP: 129/81  Pulse: 80  Resp: 18  Temp: 98.2 F (36.8 C)   Filed Weights   09/26/20 1101  Weight: 176 lb 1.6 oz (79.9 kg)    Physical Exam Constitutional:      General: She is not in acute distress.    Appearance: She is obese. She is not diaphoretic.  HENT:     Head: Normocephalic and atraumatic.     Nose: Nose normal.     Mouth/Throat:     Pharynx: No oropharyngeal exudate.  Eyes:     General: No scleral icterus.    Pupils: Pupils are equal, round, and reactive to light.  Cardiovascular:     Rate and Rhythm: Normal rate and regular rhythm.     Heart sounds: No murmur heard.   Pulmonary:     Effort: Pulmonary effort is normal. No respiratory distress.     Breath sounds: No rales.  Chest:     Chest wall: No tenderness.  Abdominal:     General: There is no distension.     Palpations: Abdomen is soft.     Tenderness: There is no abdominal tenderness.  Musculoskeletal:        General: Normal range of motion.     Cervical back: Normal range of motion and neck supple.  Skin:    General: Skin is warm and dry.     Findings: No erythema.  Neurological:     Mental Status: She is alert and oriented to person, place, and time.     Cranial Nerves: No cranial nerve deficit.     Motor: No abnormal muscle tone.     Coordination: Coordination normal.  Psychiatric:        Mood and Affect: Affect normal.        LABORATORY DATA:  I have reviewed the data as listed Lab Results  Component Value Date     WBC 7.4 08/21/2020   HGB 13.7 08/21/2020   HCT 40.6 08/21/2020   MCV 85.8 08/21/2020   PLT 221 08/21/2020   Recent Labs    02/16/20 1128 08/21/20 1625  NA 140 140  K 4.5 4.2  CL 104 106  CO2 29 25  GLUCOSE 82 101*  BUN 19 24*  CREATININE 0.73 0.77  CALCIUM 9.0 9.1  GFRNONAA  --  >60  GFRAA  --  >60  PROT 6.7 7.4  ALBUMIN 4.1 4.3  AST 18 19  ALT 18 16  ALKPHOS 87 82  BILITOT 0.6 0.4   Iron/TIBC/Ferritin/ %Sat No results found for: IRON, TIBC, FERRITIN, IRONPCTSAT    RADIOGRAPHIC STUDIES: I have personally reviewed the radiological images as listed and agreed with the findings in the report. NM SENTINEL NODE INJECTION  Result Date: 09/11/2020 CLINICAL DATA:  Left breast cancer. EXAM: NUCLEAR MEDICINE BREAST LYMPHOSCINTIGRAPHY TECHNIQUE: Intradermal injection of radiopharmaceutical was performed at the 12 o'clock, 3 o'clock, 6 o'clock, and 9 o'clock positions around the left nipple. The patient was then sent to the operating room where the sentinel node(s) were identified and removed by the surgeon. RADIOPHARMACEUTICALS:  Total of 0.623 mCi Millipore-filtered Technetium-3m sulfur colloid, divided into 4 aliquots IMPRESSION: Uncomplicated intradermal injection of a total of 0.623 mCi Technetium-29m sulfur colloid for purposes of sentinel node identification. Electronically Signed   By: Elige Ko   On: 09/11/2020 10:02   MM Breast Surgical Specimen  Result Date: 09/11/2020 CLINICAL  DATA:  Status post excision the 2 adjacent breast masses. EXAM: SPECIMEN RADIOGRAPH OF THE LEFT BREAST COMPARISON:  Previous exam(s). FINDINGS: Status post excision of the 2 bracketed lesions in the LEFT breast. The 2 wires, Q clip and X clip are present in the radiograph. Mass with associated calcifications in association with the mass marked by the X clip is seen. The mass marked by the Q clip is seen. Q clip is at F 6. X clip is at K 8. IMPRESSION: Specimen radiograph of the LEFT breast surgical  specimen. Electronically Signed   By: Meda Klinefelter MD   On: 09/11/2020 10:48   US BREAST LTD UNI LEFT INC AXILLA  Result Date: 08/30/2020 CLINICAL DATA:  Recent diagnosis of breast cancer at 2 sites of the LEFT breast, 4 o'clock and 6 o'clock axes. Subsequent MRI demonstrating abnormal enhancement within the subareolar LEFT breast for which patient presents today for targeted second-look ultrasound. EXAM: ULTRASOUND OF THE LEFT BREAST COMPARISON:  Previous exams including breast MRI dated 08/27/2020 and ultrasound breast biopsy dated 08/16/2020. FINDINGS: Targeted ultrasound is performed, evaluating the subareolar and retroareolar LEFT breast, showing only normal fibroglandular tissues and fat lobules. No correlate for the abnormal enhancement seen within the subareolar LEFT breast on MRI dated 08/27/2020. There is redemonstration today of a benign cyst in the retroareolar LEFT breast, 10 o'clock axis, corresponding to a benign cyst seen within the inner LEFT breast on MRI. IMPRESSION: No sonographic correlate for the abnormal enhancement seen within the subareolar LEFT breast on MRI dated 08/27/2020. RECOMMENDATION: 1. Per treatment plan for patient's known biopsy-proven LEFT breast cancers at the 6 o'clock and 4 o'clock axes. 2. If breast conservation surgery is performed for the known biopsy-proven LEFT breast cancers, recommend surgical excision (central duct excision) of the subareolar/retroareolar LEFT breast corresponding to the site of the abnormal enhancement seen on MRI. This finding is not amenable to MRI-guided biopsy due to its location immediately subjacent to the nipple. I have discussed the findings and recommendations with the patient. If applicable, a reminder letter will be sent to the patient regarding the next appointment. BI-RADS CATEGORY  6: Known biopsy-proven malignancy. Electronically Signed   By: Bary Richard M.D.   On: 08/30/2020 15:49   MM LT PLC BREAST LOC DEV   1ST LESION   INC MAMMO GUIDE  Result Date: 09/11/2020 CLINICAL DATA:  Bracket localization of malignancy x2 EXAM: NEEDLE LOCALIZATION OF THE LEFT BREAST WITH MAMMO GUIDANCE x 2 COMPARISON:  Previous exams. PROCEDURE: Patient presents for needle localization prior to surgery. I met with the patient and we discussed the procedure of needle localization including benefits and alternatives. We discussed the high likelihood of a successful procedure. We discussed the risks of the procedure, including infection, bleeding, tissue injury, and further surgery. Informed, written consent was given. The usual time-out protocol was performed immediately prior to the procedure. Using mammographic guidance, sterile technique, 1% lidocaine and a 7 cm modified Kopans needle, a mass at 4 o'clock with associated X marker localized using a lateral approach. The mass falls in the mid portion and superficial third of the thickened portion of the wire. The X clip is just posterior to the mass and wire. Associated calcifications extend to the superficial third portion of the thickened wire and immediately adjacent thin wire. Using mammographic guidance, sterile technique, 1% lidocaine and a 9 cm modified Kopans needle, a mass at 6 o'clock with associated Q clip was localized using a lateral approach. The mass is  at the mid portion of the thickened wire. The mass extends just anterior to the wire. The Q clip is along the posterior portion of the mass. IMPRESSION: Needle localization bracketing of 2 lesions in the LEFT breast. No apparent complications. Electronically Signed   By: Valentino Saxon MD   On: 09/11/2020 08:33   MM LT PLC BREAST LOC DEV   EA ADD LESION  INC MAMMO GUIDE  Result Date: 09/11/2020 CLINICAL DATA:  Bracket localization of malignancy x2 EXAM: NEEDLE LOCALIZATION OF THE LEFT BREAST WITH MAMMO GUIDANCE x 2 COMPARISON:  Previous exams. PROCEDURE: Patient presents for needle localization prior to surgery. I met with the patient  and we discussed the procedure of needle localization including benefits and alternatives. We discussed the high likelihood of a successful procedure. We discussed the risks of the procedure, including infection, bleeding, tissue injury, and further surgery. Informed, written consent was given. The usual time-out protocol was performed immediately prior to the procedure. Using mammographic guidance, sterile technique, 1% lidocaine and a 7 cm modified Kopans needle, a mass at 4 o'clock with associated X marker localized using a lateral approach. The mass falls in the mid portion and superficial third of the thickened portion of the wire. The X clip is just posterior to the mass and wire. Associated calcifications extend to the superficial third portion of the thickened wire and immediately adjacent thin wire. Using mammographic guidance, sterile technique, 1% lidocaine and a 9 cm modified Kopans needle, a mass at 6 o'clock with associated Q clip was localized using a lateral approach. The mass is at the mid portion of the thickened wire. The mass extends just anterior to the wire. The Q clip is along the posterior portion of the mass. IMPRESSION: Needle localization bracketing of 2 lesions in the LEFT breast. No apparent complications. Electronically Signed   By: Valentino Saxon MD   On: 09/11/2020 08:33      ASSESSMENT & PLAN:  1. Malignant neoplasm of overlapping sites of left female breast, unspecified estrogen receptor status ([place])   2. Family history of cancer   3. Ductal carcinoma in situ (DCIS) of left breast    Left breast multifocal breast invasive mammary carcinoma, status post lumpectomy and sentinel lymph node biopsy pT1c pN0, margin is negative for invasive component, positive for DCIS Patient also has a separate retroareolar breast mass status post excisional biopsy, margin is positive in posterior, anterior, medial and superior. Patient has had a discussion with Dr. Peyton Najjar regarding  further surgical plan and was recommended for total mastectomy.   I had a detailed lengthy discussion with patient.  I reviewed pathology report line by line with patient. She has multifocal breast invasive carcinoma with extensive DCIS, as well as another area of DCIS.  Surgical margins were diffusely positive.  Discussed with her about the standard care recommendation.  Repeat resections may be entertained however likelihood of positive margins is very high.  I agree with Dr. Peyton Najjar that a total mastectomy is a better option for her. If she has mastectomy with negative margins, she will not need adjuvant radiation. Patient is not enthusiastic of mastectomy at this point.  Then I recommend she consider repeat resection to maximize her chance of achieving negative margin.  We discussed about the role of adjuvant radiation after repeat resection. After that she will be started on adjuvant endocrine therapy with aromatase inhibitor Oncotype DX should be considered if she is interested in open for adjuvant chemotherapy.  Currently patient is  undecided about the next surgical plan.  She says that " I am 39 and who knows how many years I can live."  She has not been back to her hometown in [place] for the past 2 years due to COVID-19 pandemic, and she really wants to postpone plan of repeat resection or mastectomy or adjuvant radiation and take adjuvant endocrine therapy with aromatase inhibitor until next spring, then she may consider to proceed with total mastectomy. I discuss with her that this would not be standard of care, and adjuvant endocrine therapy is not a definitive treatment for positive DCIS margins, but I understand and respect her wish of proceeding with a alternative treatment option in order to achieve her other goals in the life.  She will further discuss with Dr. Peyton Najjar next week and hopefully she is able to make a final decision. I communicated with Dr. Peyton Najjar with above.   Family  history of cancer, I have referred her to Dietitian.    All questions were answered. The patient knows to call the clinic with any problems questions or concerns.   Return of visit: To be determined. Pending patient's decision  Earlie Server, MD, PhD Hematology Oncology [facility] Pager- [phone] 09/26/2020

## 2020-09-26 NOTE — Progress Notes (Signed)
Patient has soreness from her surgery.

## 2020-09-30 ENCOUNTER — Telehealth: Payer: Self-pay | Admitting: Family Medicine

## 2020-09-30 DIAGNOSIS — C50912 Malignant neoplasm of unspecified site of left female breast: Secondary | ICD-10-CM

## 2020-09-30 NOTE — Telephone Encounter (Signed)
Pt called requesting a referral to a dentist.  Patient states that her Oncologist recommended she see a Dentist to check her jaw - the new medication they are putting her on for her breast cancer can diminish bones growth and often times affects the jaw/teeth. Patient would like a referral placed - no preference. She needs this done asap so that she can start treatment  Please advise, thanks.

## 2020-09-30 NOTE — Telephone Encounter (Signed)
Pt called back and states that after she got off the phone she found out that she does have dental insurance and they are going to find out from her insurance who is in network and will call back with names . Will await call back.

## 2020-10-01 ENCOUNTER — Other Ambulatory Visit: Payer: Self-pay

## 2020-10-01 ENCOUNTER — Ambulatory Visit: Payer: Self-pay | Admitting: General Surgery

## 2020-10-01 ENCOUNTER — Other Ambulatory Visit
Admission: RE | Admit: 2020-10-01 | Discharge: 2020-10-01 | Disposition: A | Discharge status: Home / Self Care | Payer: Medicare Other | Source: Ambulatory Visit | Attending: General Surgery | Admitting: General Surgery

## 2020-10-01 ENCOUNTER — Ambulatory Visit (INDEPENDENT_AMBULATORY_CARE_PROVIDER_SITE_OTHER): Payer: Medicare Other | Admitting: Psychology

## 2020-10-01 DIAGNOSIS — Z20822 Contact with and (suspected) exposure to covid-19: Secondary | ICD-10-CM | POA: Insufficient documentation

## 2020-10-01 DIAGNOSIS — F4323 Adjustment disorder with mixed anxiety and depressed mood: Secondary | ICD-10-CM | POA: Diagnosis not present

## 2020-10-01 DIAGNOSIS — Z01812 Encounter for preprocedural laboratory examination: Secondary | ICD-10-CM | POA: Diagnosis not present

## 2020-10-01 NOTE — H&P (Signed)
HISTORY OF PRESENT ILLNESS:    Ms. Senat is a 85 y.o.female patient who comes for follow up       PAST MEDICAL HISTORY:  Past Medical History:  Diagnosis Date  . Arthritis    osteoarthritis  . Basal cell carcinoma   . Glaucoma (increased eye pressure)   . Thyroid disease    hypothyroid  . Venous disease 06/11/2012   From Clinic Note5/07/2012 She underwent noninvasive venous testing revealing her to have anterior accessory and small saphenous reflux bilaterally.  Her great saphenous veins are missing bilaterally due to surgical stripping, and she has reflux in her deep system as well.  Overall, given her pigmentation and changes consistent of longstanding chronic venous insufficiency, I would like to see if she        PAST SURGICAL HISTORY:   Past Surgical History:  Procedure Laterality Date  . BREAST EXCISIONAL BIOPSY    . EYE SURGERY Bilateral    cataract  . Nasal reconstruction for basal cell carcinoma    . Vein Stripping & Ligation           MEDICATIONS:  Outpatient Encounter Medications as of 10/01/2020  Medication Sig Dispense Refill  . ascorbic acid, vitamin C, 500 mg TbER Take 1 tablet by mouth    . betamethasone dipropionate 0.05 % lotion betamethasone dipropionate 0.05 % lotion    . betamethasone dipropionate, augmented, (DIPROLENE-AF) 0.05 % cream betamethasone, augmented 0.05 % topical cream    . calcium carbonate (TUMS E-X) 300 mg (750 mg) chewable tablet Take by mouth as needed    . calcium carbonate 600 mg calcium (1,500 mg) Tab tablet Take 1 tablet by mouth    . celecoxib (CELEBREX) 200 MG capsule 1 capsule as needed    . cholecalciferol (VITAMIN D3) 1,250 mcg (50,000 unit) capsule Take 50,000 Units by mouth every 7 (seven) days    . cholecalciferol (VITAMIN D3) 2,000 unit capsule Take 1 capsule by mouth once daily as needed    . ciclopirox (LOPROX) 0.77 % cream APPLY 1 APPLICATION ON THE SKIN OF FEET TWICE A DAY    . clobetasoL (CORMAX) 0.05 % external  solution APPLY 1 ML TO THE SCALP TWICE A DAY    . fluticasone propionate (CUTIVATE) 0.05 % cream Apply topically    . halobetasol (ULTRAVATE) 0.05 % ointment halobetasol propionate 0.05 % topical ointment    . levothyroxine (SYNTHROID) 75 MCG tablet Take 1 tablet by mouth once daily    . losartan (COZAAR) 100 MG tablet Take 1 tablet by mouth once daily    . multivitamin (MULTIVITAMIN) tablet Take by mouth.    . nitroGLYcerin (NITROSTAT) 0.4 MG SL tablet Reported on 12/17/2015    . travoprost (TRAVATAN Z) 0.004 % Ophth ophthalmic solution Apply 1 drop to eye nightly     No facility-administered encounter medications on file as of 10/01/2020.     ALLERGIES:   Patient has no known allergies.   SOCIAL HISTORY:  Social History   Socioeconomic History  . Marital status: Married    Spouse name: Not on file  . Number of children: Not on file  . Years of education: Not on file  . Highest education level: Not on file  Occupational History  . Not on file  Tobacco Use  . Smoking status: Never Smoker  . Smokeless tobacco: Never Used  Vaping Use  . Vaping Use: Never used  Substance and Sexual Activity  . Alcohol use: Not on file  . Drug  use: Not on file  . Sexual activity: Not on file  Other Topics Concern  . Not on file  Social History Narrative  . Not on file   Social Determinants of Health   Financial Resource Strain:   . Difficulty of Paying Living Expenses:   Food Insecurity:   . Worried About Charity fundraiser in the Last Year:   . Arboriculturist in the Last Year:   Transportation Needs:   . Film/video editor (Medical):   Marland Kitchen Lack of Transportation (Non-Medical):     FAMILY HISTORY:  History reviewed. No pertinent family history.   GENERAL REVIEW OF SYSTEMS:   General ROS: negative for - chills, fatigue, fever, weight gain or weight loss Allergy and Immunology ROS: negative for - hives  Hematological and Lymphatic ROS: negative for - bleeding problems or  bruising, negative for palpable nodes Endocrine ROS: negative for - heat or cold intolerance, hair changes Respiratory ROS: negative for - cough, shortness of breath or wheezing Cardiovascular ROS: no chest pain or palpitations GI ROS: negative for nausea, vomiting, abdominal pain, diarrhea, constipation Musculoskeletal ROS: negative for - joint swelling or muscle pain Neurological ROS: negative for - confusion, syncope Dermatological ROS: negative for pruritus and rash  PHYSICAL EXAM:  Vitals:   10/01/20 1459  BP: 132/85  Pulse: 79  .  Ht:167.6 cm (5\' 6" ) Wt:74.4 kg (164 lb) LJQ:GBEE surface area is 1.86 meters squared. Body mass index is 26.47 kg/m.Marland Kitchen   GENERAL: Alert, active, oriented x3  HEENT: Pupils equal reactive to light. Extraocular movements are intact. Sclera clear. Palpebral conjunctiva normal red color.Pharynx clear.  NECK: Supple with no palpable mass and no adenopathy.  LUNGS: Sound clear with no rales rhonchi or wheezes.  HEART: Regular rhythm S1 and S2 without murmur.  BREAST: Left breast with diffuse cellulitis.  Tender to palpation.  Warm to touch.  ABDOMEN: Soft and depressible, nontender with no palpable mass, no hepatomegaly.   EXTREMITIES: Well-developed well-nourished symmetrical with no dependent edema.  NEUROLOGICAL: Awake alert oriented, facial expression symmetrical, moving all extremities.      IMPRESSION:     Left breast abscess [N61.1] -Patient with left breast abscess due to infected seroma.  We will take her to the operating room for incision and drainage of deep abscess.  Patient oriented about the procedure and agreed to proceed.          PLAN:  1. Incision and drainage of left breast abscess (10071)  Patient verbalized understanding, all questions were answered, and were agreeable with the plan outlined above.   [name], MD  Electronically signed by [name], MD

## 2020-10-01 NOTE — H&P (View-Only) (Signed)
HISTORY OF PRESENT ILLNESS:    Beth Rodgers is a 85 y.o.female patient who comes for follow up       PAST MEDICAL HISTORY:  Past Medical History:  Diagnosis Date   Arthritis    osteoarthritis   Basal cell carcinoma    Glaucoma (increased eye pressure)    Thyroid disease    hypothyroid   Venous disease 06/11/2012   From Clinic Note5/07/2012 She underwent noninvasive venous testing revealing her to have anterior accessory and small saphenous reflux bilaterally.  Her great saphenous veins are missing bilaterally due to surgical stripping, and she has reflux in her deep system as well.  Overall, given her pigmentation and changes consistent of longstanding chronic venous insufficiency, I would like to see if she        PAST SURGICAL HISTORY:   Past Surgical History:  Procedure Laterality Date   BREAST EXCISIONAL BIOPSY     EYE SURGERY Bilateral    cataract   Nasal reconstruction for basal cell carcinoma     Vein Stripping & Ligation           MEDICATIONS:  Outpatient Encounter Medications as of 10/01/2020  Medication Sig Dispense Refill   ascorbic acid, vitamin C, 500 mg TbER Take 1 tablet by mouth     betamethasone dipropionate 0.05 % lotion betamethasone dipropionate 0.05 % lotion     betamethasone dipropionate, augmented, (DIPROLENE-AF) 0.05 % cream betamethasone, augmented 0.05 % topical cream     calcium carbonate (TUMS E-X) 300 mg (750 mg) chewable tablet Take by mouth as needed     calcium carbonate 600 mg calcium (1,500 mg) Tab tablet Take 1 tablet by mouth     celecoxib (CELEBREX) 200 MG capsule 1 capsule as needed     cholecalciferol (VITAMIN D3) 1,250 mcg (50,000 unit) capsule Take 50,000 Units by mouth every 7 (seven) days     cholecalciferol (VITAMIN D3) 2,000 unit capsule Take 1 capsule by mouth once daily as needed     ciclopirox (LOPROX) 0.77 % cream APPLY 1 APPLICATION ON THE SKIN OF FEET TWICE A DAY     clobetasoL (CORMAX) 0.05 % external  solution APPLY 1 ML TO THE SCALP TWICE A DAY     fluticasone propionate (CUTIVATE) 0.05 % cream Apply topically     halobetasol (ULTRAVATE) 0.05 % ointment halobetasol propionate 0.05 % topical ointment     levothyroxine (SYNTHROID) 75 MCG tablet Take 1 tablet by mouth once daily     losartan (COZAAR) 100 MG tablet Take 1 tablet by mouth once daily     multivitamin (MULTIVITAMIN) tablet Take by mouth.     nitroGLYcerin (NITROSTAT) 0.4 MG SL tablet Reported on 12/17/2015     travoprost (TRAVATAN Z) 0.004 % Ophth ophthalmic solution Apply 1 drop to eye nightly     No facility-administered encounter medications on file as of 10/01/2020.     ALLERGIES:   Patient has no known allergies.   SOCIAL HISTORY:  Social History   Socioeconomic History   Marital status: Married    Spouse name: Not on file   Number of children: Not on file   Years of education: Not on file   Highest education level: Not on file  Occupational History   Not on file  Tobacco Use   Smoking status: Never Smoker   Smokeless tobacco: Never Used  Vaping Use   Vaping Use: Never used  Substance and Sexual Activity   Alcohol use: Not on file   Drug  use: Not on file   Sexual activity: Not on file  Other Topics Concern   Not on file  Social History Narrative   Not on file   Social Determinants of Health   Financial Resource Strain:    Difficulty of Paying Living Expenses:   Food Insecurity:    Worried About Tetherow in the Last Year:    Ran Out of Food in the Last Year:   Transportation Needs:    Film/video editor (Medical):    Lack of Transportation (Non-Medical):     FAMILY HISTORY:  History reviewed. No pertinent family history.   GENERAL REVIEW OF SYSTEMS:   General ROS: negative for - chills, fatigue, fever, weight gain or weight loss Allergy and Immunology ROS: negative for - hives  Hematological and Lymphatic ROS: negative for - bleeding problems or  bruising, negative for palpable nodes Endocrine ROS: negative for - heat or cold intolerance, hair changes Respiratory ROS: negative for - cough, shortness of breath or wheezing Cardiovascular ROS: no chest pain or palpitations GI ROS: negative for nausea, vomiting, abdominal pain, diarrhea, constipation Musculoskeletal ROS: negative for - joint swelling or muscle pain Neurological ROS: negative for - confusion, syncope Dermatological ROS: negative for pruritus and rash  PHYSICAL EXAM:  Vitals:   10/01/20 1459  BP: 132/85  Pulse: 79  .  Ht:167.6 cm (5\' 6" ) Wt:74.4 kg (164 lb) JJK:KXFG surface area is 1.86 meters squared. Body mass index is 26.47 kg/m.Marland Kitchen   GENERAL: Alert, active, oriented x3  HEENT: Pupils equal reactive to light. Extraocular movements are intact. Sclera clear. Palpebral conjunctiva normal red color.Pharynx clear.  NECK: Supple with no palpable mass and no adenopathy.  LUNGS: Sound clear with no rales rhonchi or wheezes.  HEART: Regular rhythm S1 and S2 without murmur.  BREAST: Left breast with diffuse cellulitis.  Tender to palpation.  Warm to touch.  ABDOMEN: Soft and depressible, nontender with no palpable mass, no hepatomegaly.   EXTREMITIES: Well-developed well-nourished symmetrical with no dependent edema.  NEUROLOGICAL: Awake alert oriented, facial expression symmetrical, moving all extremities.      IMPRESSION:     Left breast abscess [N61.1] -Patient with left breast abscess due to infected seroma.  We will take her to the operating room for incision and drainage of deep abscess.  Patient oriented about the procedure and agreed to proceed.          PLAN:  1. Incision and drainage of left breast abscess (18299)  Patient verbalized understanding, all questions were answered, and were agreeable with the plan outlined above.   [name], MD  Electronically signed by [name], MD

## 2020-10-02 ENCOUNTER — Ambulatory Visit: Payer: Medicare Other | Admitting: Certified Registered Nurse Anesthetist

## 2020-10-02 ENCOUNTER — Encounter: Payer: Self-pay | Admitting: *Deleted

## 2020-10-02 ENCOUNTER — Encounter: Payer: Self-pay | Admitting: General Surgery

## 2020-10-02 ENCOUNTER — Encounter
Admission: RE | Disposition: A | Discharge status: Home / Self Care | Payer: Self-pay | Source: Home / Self Care | Attending: General Surgery

## 2020-10-02 ENCOUNTER — Ambulatory Visit
Admission: RE | Admit: 2020-10-02 | Discharge: 2020-10-02 | Disposition: A | Discharge status: Home / Self Care | Payer: Medicare Other | Attending: General Surgery | Admitting: General Surgery

## 2020-10-02 DIAGNOSIS — N611 Abscess of the breast and nipple: Secondary | ICD-10-CM | POA: Diagnosis not present

## 2020-10-02 DIAGNOSIS — Z85828 Personal history of other malignant neoplasm of skin: Secondary | ICD-10-CM | POA: Diagnosis not present

## 2020-10-02 DIAGNOSIS — Z79899 Other long term (current) drug therapy: Secondary | ICD-10-CM | POA: Diagnosis not present

## 2020-10-02 DIAGNOSIS — Z5329 Procedure and treatment not carried out because of patient's decision for other reasons: Secondary | ICD-10-CM | POA: Insufficient documentation

## 2020-10-02 DIAGNOSIS — E039 Hypothyroidism, unspecified: Secondary | ICD-10-CM | POA: Diagnosis not present

## 2020-10-02 DIAGNOSIS — Z79890 Hormone replacement therapy (postmenopausal): Secondary | ICD-10-CM | POA: Diagnosis not present

## 2020-10-02 DIAGNOSIS — C50912 Malignant neoplasm of unspecified site of left female breast: Secondary | ICD-10-CM

## 2020-10-02 DIAGNOSIS — H409 Unspecified glaucoma: Secondary | ICD-10-CM | POA: Diagnosis not present

## 2020-10-02 DIAGNOSIS — Y838 Other surgical procedures as the cause of abnormal reaction of the patient, or of later complication, without mention of misadventure at the time of the procedure: Secondary | ICD-10-CM | POA: Diagnosis not present

## 2020-10-02 DIAGNOSIS — M96843 Postprocedural seroma of a musculoskeletal structure following other procedure: Secondary | ICD-10-CM | POA: Diagnosis not present

## 2020-10-02 LAB — SARS CORONAVIRUS 2 (TAT 6-24 HRS): SARS Coronavirus 2: NEGATIVE

## 2020-10-02 LAB — GLUCOSE, CAPILLARY: Glucose-Capillary: 92 mg/dL (ref 70–99)

## 2020-10-02 SURGERY — INCISION AND DRAINAGE, ABSCESS
Anesthesia: General | Site: Breast | Laterality: Left

## 2020-10-02 MED ORDER — CHLORHEXIDINE GLUCONATE 0.12 % MT SOLN: 15.0000 mL | Freq: Once | OROMUCOSAL | Status: AC

## 2020-10-02 MED ORDER — LIDOCAINE HCL (PF) 2 % IJ SOLN
INTRAMUSCULAR | Status: AC
  Filled 2020-10-02: qty 5

## 2020-10-02 MED ORDER — FAMOTIDINE 20 MG PO TABS
ORAL_TABLET | ORAL | Status: AC
  Filled 2020-10-02: qty 1

## 2020-10-02 MED ORDER — AMOXICILLIN-POT CLAVULANATE 875-125 MG PO TABS: 1.0000 | ORAL_TABLET | Freq: Two times a day (BID) | ORAL | 0 refills | Status: AC

## 2020-10-02 MED ORDER — CHLORHEXIDINE GLUCONATE 0.12 % MT SOLN
OROMUCOSAL | Status: AC
  Administered 2020-10-02: 15 mL via OROMUCOSAL
  Filled 2020-10-02: qty 15

## 2020-10-02 MED ORDER — FENTANYL CITRATE (PF) 100 MCG/2ML IJ SOLN
INTRAMUSCULAR | Status: AC
  Filled 2020-10-02: qty 2

## 2020-10-02 MED ORDER — PROPOFOL 10 MG/ML IV BOLUS
INTRAVENOUS | Status: AC
  Filled 2020-10-02: qty 20

## 2020-10-02 MED ORDER — ORAL CARE MOUTH RINSE: 15.0000 mL | Freq: Once | OROMUCOSAL | Status: AC

## 2020-10-02 MED ORDER — CEFAZOLIN SODIUM-DEXTROSE 2-4 GM/100ML-% IV SOLN
INTRAVENOUS | Status: AC
  Filled 2020-10-02: qty 100

## 2020-10-02 MED ORDER — LACTATED RINGERS IV SOLN: INTRAVENOUS | Status: DC

## 2020-10-02 MED ORDER — ONDANSETRON HCL 4 MG/2ML IJ SOLN
INTRAMUSCULAR | Status: AC
  Filled 2020-10-02: qty 2

## 2020-10-02 MED ORDER — DEXAMETHASONE SODIUM PHOSPHATE 10 MG/ML IJ SOLN
INTRAMUSCULAR | Status: AC
  Filled 2020-10-02: qty 1

## 2020-10-02 MED ORDER — CEFAZOLIN SODIUM-DEXTROSE 2-4 GM/100ML-% IV SOLN: 2.0000 g | INTRAVENOUS | Status: DC

## 2020-10-02 SURGICAL SUPPLY — 23 items
BLADE CLIPPER SURG (BLADE) ×3 IMPLANT
BLADE SURG 15 STRL LF DISP TIS (BLADE) ×1 IMPLANT
BLADE SURG 15 STRL SS (BLADE) ×2
BRUSH SCRUB EZ  4% CHG (MISCELLANEOUS) ×2
BRUSH SCRUB EZ 4% CHG (MISCELLANEOUS) ×1 IMPLANT
CANISTER SUCT 3000ML PPV (MISCELLANEOUS) ×3 IMPLANT
COVER WAND RF STERILE (DRAPES) ×3 IMPLANT
DRAPE CHEST BREAST 77X106 FENE (MISCELLANEOUS) IMPLANT
DRAPE LAPAROTOMY 77X122 PED (DRAPES) ×3 IMPLANT
ELECT REM PT RETURN 9FT ADLT (ELECTROSURGICAL) ×3
ELECTRODE REM PT RTRN 9FT ADLT (ELECTROSURGICAL) ×1 IMPLANT
GLOVE BIO SURGEON STRL SZ 6.5 (GLOVE) ×2 IMPLANT
GLOVE BIO SURGEONS STRL SZ 6.5 (GLOVE) ×1
GLOVE BIOGEL PI IND STRL 6.5 (GLOVE) ×1 IMPLANT
GLOVE BIOGEL PI INDICATOR 6.5 (GLOVE) ×2
GOWN STRL REUS W/ TWL LRG LVL3 (GOWN DISPOSABLE) ×2 IMPLANT
GOWN STRL REUS W/TWL LRG LVL3 (GOWN DISPOSABLE) ×4
NEEDLE HYPO 22GX1.5 SAFETY (NEEDLE) ×3 IMPLANT
NS IRRIG 1000ML POUR BTL (IV SOLUTION) ×3 IMPLANT
PACK BASIN MINOR (MISCELLANEOUS) ×3 IMPLANT
SOL PREP PVP 2OZ (MISCELLANEOUS) ×6
SOLUTION PREP PVP 2OZ (MISCELLANEOUS) ×2 IMPLANT
SPONGE LAP 18X18 RF (DISPOSABLE) ×3 IMPLANT

## 2020-10-02 NOTE — Anesthesia Preprocedure Evaluation (Signed)
Anesthesia Evaluation  Patient identified by MRN, date of birth, ID band Patient awake    Reviewed: Allergy & Precautions, NPO status , Patient's Chart, lab work & pertinent test results  History of Anesthesia Complications Negative for: history of anesthetic complications  Airway Mallampati: II  TM Distance: >3 FB Neck ROM: Full    Dental no notable dental hx.    Pulmonary neg pulmonary ROS, neg sleep apnea, neg COPD,    breath sounds clear to auscultation- rhonchi (-) wheezing      Cardiovascular hypertension, Pt. on medications (-) CAD, (-) Past MI, (-) Cardiac Stents and (-) CABG  Rhythm:Regular Rate:Normal - Systolic murmurs and - Diastolic murmurs    Neuro/Psych neg Seizures PSYCHIATRIC DISORDERS Depression negative neurological ROS     GI/Hepatic Neg liver ROS, GERD  ,  Endo/Other  neg diabetesHypothyroidism   Renal/GU negative Renal ROS     Musculoskeletal  (+) Arthritis ,   Abdominal (+) - obese,   Peds negative pediatric ROS (+)  Hematology negative hematology ROS (+)   Anesthesia Other Findings Past Medical History: No date: Basal cell carcinoma (BCC) 08/10/2018: Cellulitis No date: Chronic venous insufficiency     Comment:  with varicose veins No date: GERD (gastroesophageal reflux disease) No date: Glaucoma No date: History  of basal cell carcinoma     Comment:  left nare/BREAST CANCER No date: History of chicken pox No date: Hypertension No date: Hypoglycemia No date: Hypothyroid No date: Osteoarthritis     Comment:  back pain, L knee pain 06/2009: Osteopenia     Comment:  DEXA 11/2015: T -2.3 hip, -2.2 spine, on longterm               bisphosphonate/evista 04/16/2016: Pyogenic granuloma   Reproductive/Obstetrics                             Anesthesia Physical  Anesthesia Plan  ASA: II  Anesthesia Plan: General   Post-op Pain Management:    Induction:  Intravenous  PONV Risk Score and Plan: 2  Airway Management Planned: LMA  Additional Equipment:   Intra-op Plan:   Post-operative Plan: Extubation in OR  Informed Consent: I have reviewed the patients History and Physical, chart, labs and discussed the procedure including the risks, benefits and alternatives for the proposed anesthesia with the patient or authorized representative who has indicated his/her understanding and acceptance.     Dental advisory given  Plan Discussed with: CRNA and Anesthesiologist  Anesthesia Plan Comments:         Anesthesia Quick Evaluation

## 2020-10-02 NOTE — Progress Notes (Signed)
Received call today from Dr. Peyton Najjar that patient was interested in getting genetic testing.  I have talked with the patient today.  She would like to proceed with testing if covered by her insurance.  I explained that I could set her up an appointment and she could discuss with the genetic counselor.  Referral placed for genetic counselor.  CC'd Dr. Tasia Catchings.

## 2020-10-02 NOTE — Interval H&P Note (Signed)
History and Physical Interval Note:  10/02/2020 9:39 AM  Beth Rodgers  has presented today for surgery, with the diagnosis of N61.1 Lt breast abscess.  The various methods of treatment have been discussed with the patient and family. After consideration of risks, benefits and other options for treatment, the patient has consented to  Procedure(s): INCISION AND DRAINAGE ABSCESS (Left) breast as a surgical intervention.  The patient's history has been reviewed, patient examined, no change in status, stable for surgery.  I have reviewed the patient's chart and labs.  Questions were answered to the patient's satisfaction.     [name]

## 2020-10-02 NOTE — Progress Notes (Signed)
Patient re evaluated at the pre procedure room because she does not want to proceed with surgery. She reports that the left breast is feeling better. She reports that is softer. I discuss with her that the best treatment at this moment it incision and drainage of the seroma due to the cellulitis. She insist that the breast is feeling better and that it will heal naturally. Since patient is not consenting to have incision and drainage, I will cancer surgery. I will order antibiotic therapy and follow her in my office.   [name], MD

## 2020-10-03 ENCOUNTER — Telehealth: Payer: Self-pay | Admitting: Family Medicine

## 2020-10-03 ENCOUNTER — Telehealth: Payer: Self-pay | Admitting: Oncology

## 2020-10-03 NOTE — Telephone Encounter (Signed)
Patient called in requesting that PCP see if she can start seeing Dr.Hyman Muss in [place] before the appointment on 10/13. Please advise.

## 2020-10-03 NOTE — Telephone Encounter (Signed)
I had a lengthy discussion with patient on 09/30/20. I reviewed the surgical pathology again with her in details and discussed with her about the standard recommendation about mastectomy , or at least an attempt of  repeat resection to achieve negative margin,although very likely she may have another positive margin.  Also discussed about adjuvant radiation and endocrine therapy.  She appreciates the discussion and voices understanding of the standard therapy options.However, she plans to go back to her home country to see her family in November and does not want to proceed with surgery or radiation at this point. She is interested in start taking endocrine therapy and defer surgery and radiation at this point. She understands that the endocrine therapy does not replace the need of repeat surgery and RT, and she is aware of the small but potential risk of disease progression.  She would like to further discuss with Dr.Cintron later this week and she will update me about her decision.

## 2020-10-04 NOTE — Telephone Encounter (Signed)
See previous phone note.  

## 2020-10-04 NOTE — Telephone Encounter (Addendum)
See other phone note.  Pt requests to see Dr.Hyman Muss Oncologist in [place] - has appointment on 10/13 Referral placed.

## 2020-10-04 NOTE — Addendum Note (Signed)
Addended by: Ria Bush on: 10/04/2020 07:29 AM   Modules accepted: Orders

## 2020-10-09 ENCOUNTER — Ambulatory Visit (INDEPENDENT_AMBULATORY_CARE_PROVIDER_SITE_OTHER): Payer: Medicare Other | Admitting: Family Medicine

## 2020-10-09 ENCOUNTER — Other Ambulatory Visit: Payer: Self-pay

## 2020-10-09 ENCOUNTER — Encounter: Payer: Self-pay | Admitting: *Deleted

## 2020-10-09 ENCOUNTER — Encounter: Payer: Self-pay | Admitting: Family Medicine

## 2020-10-09 VITALS — BP 140/82 | HR 82 | Temp 97.9°F | Ht 69.0 in | Wt 174.4 lb

## 2020-10-09 DIAGNOSIS — Z170 Estrogen receptor positive status [ER+]: Secondary | ICD-10-CM

## 2020-10-09 DIAGNOSIS — N61 Mastitis without abscess: Secondary | ICD-10-CM | POA: Diagnosis not present

## 2020-10-09 DIAGNOSIS — M7662 Achilles tendinitis, left leg: Secondary | ICD-10-CM | POA: Diagnosis not present

## 2020-10-09 DIAGNOSIS — C50812 Malignant neoplasm of overlapping sites of left female breast: Secondary | ICD-10-CM

## 2020-10-09 NOTE — Patient Instructions (Addendum)
Seno sigue inflamado - si no mejora proximo paso es incision/drenaje.  Gusto verla hoy!  Esperaremos evaluacion de Dr Edmonia Lynch.

## 2020-10-09 NOTE — Progress Notes (Signed)
Patient called wanting a prescription for dental clearance prior to taking "Tamoxifen".  Informed patient I would need to discuss with Dr. Tasia Catchings, since we typically get dental clearance when treating osteoporosis.  Discussed with Dr. Tasia Catchings.  She would like for patient to go on an AI, and at some point, Dr. Tasia Catchings will discuss treating her osteopenia with Zometa, which will need dental clearance prior to receiving.  I reviewed this with the patient.  She verbalizes understanding.  She would like the name of the AI.  She would like to do her research.  She will call us back for the prescription for the AI when Dr. Peyton Najjar releases her from treating her current infection.

## 2020-10-09 NOTE — Progress Notes (Signed)
This visit was conducted in person.  BP 140/82 (BP Location: Right Arm, Patient Position: Sitting, Cuff Size: Normal)   Pulse 82   Temp 97.9 F (36.6 C) (Temporal)   Ht $R'5\' 9"'yN$  (1.753 m)   Wt 174 lb 7 oz (79.1 kg)   SpO2 97%   BMI 25.76 kg/m    CC: discuss breast cancer  Subjective:    Patient ID: [name], female    DOB: October 18, 1935, 85 y.o.   MRN: [id number]  HPI: KEISHIA GROUND is a 85 y.o. female presenting on 10/09/2020 for Scar (Wants lumpectomy site checked.  Also has questions regarding oncology. )   Recent dx L breast cancer 07/2020 with 2 separate cancer foci - 4pm invasive multifocal mammary carcinoma, 6pm invasive mammary lobular carcinoma. MRI showed nonspecific subareolar changes. Underwent L breast lumpectomy/partial mastectomy 09/11/2020 and sentinel lymph node biopsy of L axilla, as well as L retroareolar breast excisional biopsy.   Pathology showed: Left breast invasive mammary carcinoma multifocal, pT1c pN0, ER/PR positive, HER-2 negative, grade 1, DCIS present,-LVI,  Margin status is negative for invasive carcinoma.  Positive for DCIS in the posterior and superior margin. Left retroareolar excisional biopsy showed high-grade comedo DCIS.  Margin is positive for superior, posterior, anterior, medial margin.  pTis pN0  Developed breast inflammation/abscess s/p antibiotic treatment with augmentin course followed by surgery. Pt declined I&D. Has close surgery f/u planned.   Patient spoke with onc who given affected margins recommended mastectomy or at least attempt at repeat resection to achieve negative margins - pt hesitant given upcoming planned trip to [place]. She is leaning towards starting endocrine therapy (tamoxifen).   Planning to see dentist for dental evaluation/clearance and Dr Waldo Laine onc for second opinion (10/28/2020) prior to starting tamoxifen.   Seeing [facility] for insertional achilles tendinitis who suggested heel lifts, heel raises, calf  stretches, ice and heat. Offered prednisone dose pack - I recommend against this given concern for current breast infection.      Relevant past medical, surgical, family and social history reviewed and updated as indicated. Interim medical history since our last visit reviewed. Allergies and medications reviewed and updated. Outpatient Medications Prior to Visit  Medication Sig Dispense Refill  . amoxicillin-clavulanate (AUGMENTIN) 875-125 MG tablet Take 1 tablet by mouth 2 (two) times daily for 10 days. 20 tablet 0  . Ascorbic Acid (VITAMIN C CR PO) Take 500 mg by mouth daily.     . calcium carbonate (TUMS EX) 750 MG chewable tablet Chew 1 tablet by mouth as needed for heartburn.     . Cholecalciferol (VITAMIN D) 50 MCG (2000 UT) CAPS Take 1 capsule (2,000 Units total) by mouth daily. 30 capsule   . Crisaborole (EUCRISA) 2 % OINT Apply 1 application topically daily as needed (Sore on legs).     Marland Kitchen desonide (DESOWEN) 0.05 % cream Apply 1 application topically 3 (three) times a week.    . diclofenac sodium (VOLTAREN) 1 % GEL Apply 1 application topically 3 (three) times daily. 1 Tube 0  . fluticasone (CUTIVATE) 0.05 % cream Apply topically daily. For max 2 wks at a time (Patient taking differently: Apply 1 application topically daily as needed (toe fungus). For max 2 wks at a time) 30 g 0  . halobetasol (ULTRAVATE) 0.05 % ointment Apply 1 application topically 4 (four) times a week.   1  . levothyroxine (SYNTHROID) 75 MCG tablet Take 1 tablet (75 mcg total) by mouth daily. (Patient taking differently: Take  75 mcg by mouth daily before breakfast. ) 90 tablet 3  . losartan (COZAAR) 100 MG tablet Take 1 tablet (100 mg total) by mouth daily. (Patient taking differently: Take 100 mg by mouth at bedtime. ) 90 tablet 3  . Multiple Vitamin (MULTIVITAMIN) tablet Take 1 tablet by mouth daily.     Marland Kitchen NITROSTAT 0.4 MG SL tablet Place 0.4 mg under the tongue every 5 (five) minutes as needed for chest pain.     .  silver sulfADIAZINE (SILVADENE) 1 % cream Apply 1 application topically daily.     . Travoprost, BAK Free, (TRAVATAN) 0.004 % SOLN ophthalmic solution Place 1 drop into both eyes at bedtime.     No facility-administered medications prior to visit.     Per HPI unless specifically indicated in ROS section below Review of Systems Objective:  BP 140/82 (BP Location: Right Arm, Patient Position: Sitting, Cuff Size: Normal)   Pulse 82   Temp 97.9 F (36.6 C) (Temporal)   Ht $R'5\' 9"'rL$  (1.753 m)   Wt 174 lb 7 oz (79.1 kg)   SpO2 97%   BMI 25.76 kg/m   Wt Readings from Last 3 Encounters:  10/09/20 174 lb 7 oz (79.1 kg)  09/26/20 176 lb 1.6 oz (79.9 kg)  09/11/20 174 lb (78.9 kg)      Physical Exam Vitals and nursing note reviewed.  Constitutional:      Appearance: Normal appearance. She is not ill-appearing.  Chest:       Comments: Scabs present at sites of breast biopsies as well as tender indurated erythema to left breast. Healing incisions to axilla and inferior breast  Neurological:     Mental Status: She is alert.       Results for orders placed or performed during the hospital encounter of 10/02/20  Glucose, capillary  Result Value Ref Range   Glucose-Capillary 92 70 - 99 mg/dL   Assessment & Plan:  This visit occurred during the SARS-CoV-2 public health emergency.  Safety protocols were in place, including screening questions prior to the visit, additional usage of staff PPE, and extensive cleaning of exam room while observing appropriate contact time as indicated for disinfecting solutions.   Problem List Items Addressed This Visit    Left Achilles tendinitis    rec against prednisone taper at this time due to concern for current breast infection She will continue conservative management.       Breast infection in female    Infected seroma of L breast, managed by surgery completing augmentin course - has close f/u planned. She states she notes ongoing improvement daily  on antibiotics. Discussed next step of I&D if not continuing to improve.       Breast cancer, left breast ([place]) - Primary    Reviewed recent workup and evaluation.  Appreciate Dr Tasia Catchings and Dr Darrol Poke care.  She is aware of recommendation for rpt surgery or mastectomy for positive margins for cancer however is hesitant for more extensive surgery given upcoming planned travel and advanced age (although she is aware that she has high vitality and good life expectancy).  She has requested and been referred for second opinion at [facility] oncology and will wait to meet with them in ~2 wks prior to making any definite decision.           No orders of the defined types were placed in this encounter.  No orders of the defined types were placed in this encounter.  Patient Instructions  Agnes Lawrence  sigue inflamado - si no mejora proximo paso es incision/drenaje.  Gusto verla hoy!  Esperaremos evaluacion de Dr Edmonia Lynch.    Follow up plan: No follow-ups on file.  Ria Bush, MD

## 2020-10-10 DIAGNOSIS — N61 Mastitis without abscess: Secondary | ICD-10-CM | POA: Insufficient documentation

## 2020-10-10 NOTE — Assessment & Plan Note (Signed)
rec against prednisone taper at this time due to concern for current breast infection She will continue conservative management.

## 2020-10-10 NOTE — Assessment & Plan Note (Addendum)
Infected seroma of L breast, managed by surgery completing augmentin course - has close f/u planned. She states she notes ongoing improvement daily on antibiotics. Discussed next step of I&D if not continuing to improve.

## 2020-10-10 NOTE — Assessment & Plan Note (Addendum)
Reviewed recent workup and evaluation.  Appreciate Dr Tasia Catchings and Dr Darrol Poke care.  She is aware of recommendation for rpt surgery or mastectomy for positive margins for cancer however is hesitant for more extensive surgery given upcoming planned travel and advanced age (although she is aware that she has high vitality and good life expectancy).  She has requested and been referred for second opinion at [facility] oncology and will wait to meet with them in ~2 wks prior to making any definite decision.

## 2020-10-10 NOTE — Telephone Encounter (Addendum)
Called Dr Alinda Money office to see if they received the patient's records which were faxed 10/04/20.  Spoke with Levada Dy at [facility] - she was not sure if they received these or not.  Left a message for them to call me back  I called the patient and she informed me that Dr Alinda Money is retiring so he is not taking new patients and they scheduled her with Dr Lebron Quam on 10/28/20 at 1:30p.  She states that they did receive all her records.   Nothing further needed.

## 2020-10-11 DIAGNOSIS — C50912 Malignant neoplasm of unspecified site of left female breast: Secondary | ICD-10-CM | POA: Diagnosis not present

## 2020-10-15 ENCOUNTER — Ambulatory Visit (INDEPENDENT_AMBULATORY_CARE_PROVIDER_SITE_OTHER): Payer: Medicare Other | Admitting: Psychology

## 2020-10-15 DIAGNOSIS — F4323 Adjustment disorder with mixed anxiety and depressed mood: Secondary | ICD-10-CM | POA: Diagnosis not present

## 2020-10-22 ENCOUNTER — Other Ambulatory Visit: Payer: Self-pay

## 2020-10-22 ENCOUNTER — Encounter: Payer: Self-pay | Admitting: Licensed Clinical Social Worker

## 2020-10-22 ENCOUNTER — Inpatient Hospital Stay: Payer: Medicare Other | Attending: Oncology | Admitting: Licensed Clinical Social Worker

## 2020-10-22 ENCOUNTER — Inpatient Hospital Stay: Payer: Medicare Other

## 2020-10-22 DIAGNOSIS — Z803 Family history of malignant neoplasm of breast: Secondary | ICD-10-CM

## 2020-10-22 DIAGNOSIS — C50812 Malignant neoplasm of overlapping sites of left female breast: Secondary | ICD-10-CM | POA: Diagnosis not present

## 2020-10-22 DIAGNOSIS — Z808 Family history of malignant neoplasm of other organs or systems: Secondary | ICD-10-CM | POA: Insufficient documentation

## 2020-10-22 DIAGNOSIS — Z170 Estrogen receptor positive status [ER+]: Secondary | ICD-10-CM | POA: Diagnosis not present

## 2020-10-22 NOTE — Progress Notes (Signed)
REFERRING PROVIDER: Rickard Patience, MD [address] [place],  [place] [address]  PRIMARY PROVIDER:  Eustaquio Boyden, MD  PRIMARY REASON FOR VISIT:  1. Malignant neoplasm of overlapping sites of left breast in female, estrogen receptor positive (HCC)   2. Family history of breast cancer   3. Family history of thyroid cancer      HISTORY OF PRESENT ILLNESS:   Beth Rodgers, a 85 y.o. female, was seen for a [facility] cancer genetics consultation at the request of Dr. Cathie Hoops due to a personal and family history of cancer.  Beth Rodgers presents to clinic today to discuss the possibility of a hereditary predisposition to cancer, genetic testing, and to further clarify her future cancer risks, as well as potential cancer risks for family members.   In 2021, at the age of 71, Beth Rodgers was diagnosed with invasive mammary carcinoma of the left breast in two separate spots, she had left breast partial mastectomy in September which revealed multifocal invasive mammary carcinoma ER/PR+, Her2- as well as DCIS. Further treatment is still being discussed.   CANCER HISTORY:  Oncology History   No history exists.     RISK FACTORS:  Menarche was at age 55.  First live birth at age 65.  OCP use for approximately 15 years.  Ovaries intact: yes.  Hysterectomy: no.  Menopausal status: postmenopausal.  HRT use: 0 years. Colonoscopy: no; not examined. Mammogram within the last year: yes.   Past Medical History:  Diagnosis Date  . Basal cell carcinoma (BCC)   . Cellulitis 08/10/2018  . Chronic venous insufficiency    with varicose veins  . Family history of breast cancer   . Family history of thyroid cancer   . GERD (gastroesophageal reflux disease)   . Glaucoma   . History  of basal cell carcinoma    left nare/BREAST CANCER  . History of chicken pox   . Hypertension   . Hypoglycemia   . Hypothyroid   . Osteoarthritis    back pain, L knee pain  . Osteopenia 06/2009   DEXA 11/2015: T -2.3 hip,  -2.2 spine, on longterm bisphosphonate/evista  . Pyogenic granuloma 04/16/2016    Past Surgical History:  Procedure Laterality Date  . BASAL CELL CARCINOMA EXCISION     located on face  . BREAST BIOPSY Left    core done ing [place] 2000?  Marland Kitchen BREAST BIOPSY Left 08/16/2020   3 area bx 4:00 X, IMC, 6:00Q IMC, LN hydro #3-benign  . BREAST LUMPECTOMY Left 09/11/2020   two areas of [facility]  . CATARACT EXTRACTION     bilateral  . DEXA  06/2009   T score Spine: -1.8, hip -2.0  . EXCISION OF BREAST BIOPSY Left 09/11/2020   Procedure: EXCISION OF BREAST BIOPSY;  Surgeon: Carolan Shiver, MD;  Location: [facility] ORS;  Service: General;  Laterality: Left;  . Foam sclerotherapy Bilateral 09/2015   Jerolyn Shin, [place]  . NASAL RECONSTRUCTION  2005   for BCC L nare s/p Mohs  . nuclear stress test  2014   normal in [place]  . PARTIAL MASTECTOMY WITH NEEDLE LOCALIZATION AND AXILLARY SENTINEL LYMPH NODE BX Left 09/11/2020   Procedure: PARTIAL MASTECTOMY WITH NEEDLE LOCALIZATION AND AXILLARY SENTINEL LYMPH NODE BX;  Surgeon: Carolan Shiver, MD;  Location: [facility] ORS;  Service: General;  Laterality: Left;  . RADIOFREQUENCY ABLATION Left 01/2012   remnant L G saphenous vein below knee  . RADIOFREQUENCY ABLATION Right 02/2013   RLE vein ablation   .  VEIN LIGATION AND STRIPPING  remote   bilateral G saphenous veins    Social History   Socioeconomic History  . Marital status: Married    Spouse name: Not on file  . Number of children: 2  . Years of education: Not on file  . Highest education level: Not on file  Occupational History    Employer: RETIRED  Tobacco Use  . Smoking status: Never Smoker  . Smokeless tobacco: Never Used  Vaping Use  . Vaping Use: Never used  Substance and Sexual Activity  . Alcohol use: Yes    Comment: Occasionally drinks wine  . Drug use: No  . Sexual activity: Not Currently    Birth control/protection: Post-menopausal  Other Topics Concern  . Not on file    Social History Narrative   From [place], [place]   Caffeine: 2-3 cups/day   Lives with husband, majority of time alone as he travels to [place].   Has grandchildren nearby.   Occ: Field seismologist, Metallurgist   Activity: bed exercises, limiting walking   Diet: does get fruits and vegetables, only some water   Social Determinants of Health   Financial Resource Strain: Low Risk   . Difficulty of Paying Living Expenses: Not hard at all  Food Insecurity: No Food Insecurity  . Worried About Charity fundraiser in the Last Year: Never true  . Ran Out of Food in the Last Year: Never true  Transportation Needs: No Transportation Needs  . Lack of Transportation (Medical): No  . Lack of Transportation (Non-Medical): No  Physical Activity: Sufficiently Active  . Days of Exercise per Week: 7 days  . Minutes of Exercise per Session: 40 min  Stress: No Stress Concern Present  . Feeling of Stress : Not at all  Social Connections:   . Frequency of Communication with Friends and Family: Not on file  . Frequency of Social Gatherings with Friends and Family: Not on file  . Attends Religious Services: Not on file  . Active Member of Clubs or Organizations: Not on file  . Attends Archivist Meetings: Not on file  . Marital Status: Not on file     FAMILY HISTORY:  We obtained a detailed, 4-generation family history.  Significant diagnoses are listed below: Family History  Problem Relation Age of Onset  . Cancer Mother        thyroid cancer  . Heart disease Father        CHF  . Diabetes Father   . Glaucoma Father   . Arthritis Father   . Coronary artery disease Maternal Uncle   . Bipolar disorder Son   . Heart disease Sister        CHF  . Stroke Sister   . Brain cancer Grandson        astrocytoma  . Breast cancer Cousin        dx 64s   Beth Rodgers has 2 sons. One of her grandsons was diagnosed with astrocytoma at age 53. Patient had 3 sisters and 1 brother, none have had  cancer.   Beth Rodgers mother had thyroid cancer in her late 51s-80s. She passed at 67. Patient had 3 maternal aunts, 2 maternal uncles, no cancers. A maternal cousin had esophageal cancer, and his sister had breast cancer in her 52s. No cancers for maternal grandparents.  Beth Rodgers father died due to heart issues. There is no known history of cancer on this side of the family. Patient's mother and  father share a grandfather.   Ms. Heck is unaware of previous family history of genetic testing for hereditary cancer risks. Patient's maternal ancestors are of [place] and European descent, and paternal ancestors are of [place]/European descent. There is no reported Ashkenazi Jewish ancestry. There is no known consanguinity.    GENETIC COUNSELING ASSESSMENT: Beth Rodgers is a 85 y.o. female with a personal and family history of breast cancer which is somewhat suggestive of a hereditary cancer syndrome and predisposition to cancer. We, therefore, discussed and recommended the following at today's visit.   DISCUSSION: We discussed that approximately 5-10% of breast cancer is hereditary  Most cases of hereditary breast cancer are associated with BRCA1/BRCA2 genes, although there are other genes associated with hereditary breast cancer as well including PALB2, CHEK2, ATM .  We discussed that testing is beneficial for several reasons including  knowing about other cancer risks, identifying potential screening and risk-reduction options that may be appropriate, and to understand if other family members could be at risk for cancer and allow them to undergo genetic testing.   We reviewed the characteristics, features and inheritance patterns of hereditary cancer syndromes. We also discussed genetic testing, including the appropriate family members to test, the process of testing, insurance coverage and turn-around-time for results. We discussed the implications of a negative, positive and/or variant  of uncertain significant result. We recommended Beth Rodgers pursue genetic testing for the [facility] Breast Cancer STAT Panel + Multi-cancer gene panel.   The Multi-Cancer Panel offered by [facility] includes sequencing and/or deletion duplication testing of the following 85 genes: AIP, ALK, APC, ATM, AXIN2,BAP1,  BARD1, BLM, BMPR1A, BRCA1, BRCA2, BRIP1, CASR, CDC73, CDH1, CDK4, CDKN1B, CDKN1C, CDKN2A (p14ARF), CDKN2A (p16INK4a), CEBPA, CHEK2, CTNNA1, DICER1, DIS3L2, EGFR (c.2369C>T, p.Thr790Met variant only), EPCAM (Deletion/duplication testing only), FH, FLCN, GATA2, GPC3, GREM1 (Promoter region deletion/duplication testing only), HOXB13 (c.251G>A, p.Gly84Glu), HRAS, KIT, MAX, MEN1, MET, MITF (c.952G>A, p.Glu318Lys variant only), MLH1, MSH2, MSH3, MSH6, MUTYH, NBN, NF1, NF2, NTHL1, PALB2, PDGFRA, PHOX2B, PMS2, POLD1, POLE, POT1, PRKAR1A, PTCH1, PTEN, RAD50, RAD51C, RAD51D, RB1, RECQL4, RET, RNF43, RUNX1, SDHAF2, SDHA (sequence changes only), SDHB, SDHC, SDHD, SMAD4, SMARCA4, SMARCB1, SMARCE1, STK11, SUFU, TERC, TERT, TMEM127, TP53, TSC1, TSC2, VHL, WRN and WT1.   Based on Ms. Kynard's personal and family history of cancer, she meets medical criteria for genetic testing. Despite that she meets criteria, she may still have an out of pocket cost.   PLAN: After considering the risks, benefits, and limitations, Beth Rodgers provided informed consent to pursue genetic testing and the blood sample was sent to [facility] for analysis of the Multi-Cancer Panel. Results should be available within approximately 1 weeks' time, at which point they will be disclosed by telephone to Beth Rodgers, as will any additional recommendations warranted by these results. Beth Rodgers will receive a summary of her genetic counseling visit and a copy of her results once available. This information will also be available in Epic.   Beth Rodgers questions were answered to her satisfaction today. Our contact information was provided  should additional questions or concerns arise. Thank you for the referral and allowing [place] to share in the care of your patient.   Faith Rogue, MS, [facility] Genetic Counselor [email or web address] Phone: [phone]  The patient was seen for a total of 45 minutes in face-to-face genetic counseling.  Dr. Grayland Ormond was available for discussion regarding this case.   _______________________________________________________________________ For Office Staff:  Number of people involved in session: 1 Was an Intern/ student involved  with case: no

## 2020-10-28 DIAGNOSIS — Z853 Personal history of malignant neoplasm of breast: Secondary | ICD-10-CM | POA: Diagnosis not present

## 2020-10-28 DIAGNOSIS — F419 Anxiety disorder, unspecified: Secondary | ICD-10-CM | POA: Diagnosis not present

## 2020-10-28 DIAGNOSIS — C50912 Malignant neoplasm of unspecified site of left female breast: Secondary | ICD-10-CM | POA: Diagnosis not present

## 2020-10-28 DIAGNOSIS — D0512 Intraductal carcinoma in situ of left breast: Secondary | ICD-10-CM | POA: Diagnosis not present

## 2020-10-28 DIAGNOSIS — Z23 Encounter for immunization: Secondary | ICD-10-CM | POA: Diagnosis not present

## 2020-10-28 DIAGNOSIS — R928 Other abnormal and inconclusive findings on diagnostic imaging of breast: Secondary | ICD-10-CM | POA: Diagnosis not present

## 2020-10-28 DIAGNOSIS — Z170 Estrogen receptor positive status [ER+]: Secondary | ICD-10-CM | POA: Diagnosis not present

## 2020-10-28 DIAGNOSIS — Z780 Asymptomatic menopausal state: Secondary | ICD-10-CM | POA: Diagnosis not present

## 2020-10-28 DIAGNOSIS — C50812 Malignant neoplasm of overlapping sites of left female breast: Secondary | ICD-10-CM | POA: Diagnosis not present

## 2020-10-28 DIAGNOSIS — Z08 Encounter for follow-up examination after completed treatment for malignant neoplasm: Secondary | ICD-10-CM | POA: Diagnosis not present

## 2020-10-29 ENCOUNTER — Ambulatory Visit (INDEPENDENT_AMBULATORY_CARE_PROVIDER_SITE_OTHER): Payer: Medicare Other | Admitting: Psychology

## 2020-10-29 DIAGNOSIS — F4323 Adjustment disorder with mixed anxiety and depressed mood: Secondary | ICD-10-CM

## 2020-10-30 ENCOUNTER — Telehealth: Payer: Self-pay | Admitting: Licensed Clinical Social Worker

## 2020-10-30 ENCOUNTER — Encounter: Payer: Self-pay | Admitting: Licensed Clinical Social Worker

## 2020-10-30 ENCOUNTER — Ambulatory Visit: Payer: Self-pay | Admitting: Licensed Clinical Social Worker

## 2020-10-30 DIAGNOSIS — Z1379 Encounter for other screening for genetic and chromosomal anomalies: Secondary | ICD-10-CM | POA: Insufficient documentation

## 2020-10-30 DIAGNOSIS — Z170 Estrogen receptor positive status [ER+]: Secondary | ICD-10-CM

## 2020-10-30 DIAGNOSIS — Z803 Family history of malignant neoplasm of breast: Secondary | ICD-10-CM

## 2020-10-30 DIAGNOSIS — C50812 Malignant neoplasm of overlapping sites of left female breast: Secondary | ICD-10-CM

## 2020-10-30 DIAGNOSIS — Z808 Family history of malignant neoplasm of other organs or systems: Secondary | ICD-10-CM

## 2020-10-30 NOTE — Telephone Encounter (Signed)
Revealed negative genetic testing. This normal result is reassuring and indicates that it is unlikely Beth Rodgers's cancer is due to a hereditary cause.  It is unlikely that there is an increased risk of another cancer due to a mutation in one of these genes.  However, genetic testing is not perfect, and cannot definitively rule out a hereditary cause.  It will be important for her to keep in contact with genetics to learn if any additional testing may be needed in the future.

## 2020-10-30 NOTE — Progress Notes (Signed)
HPI:  [name] was previously seen in the [facility] due to a personal and family history of cancer and concerns regarding a hereditary predisposition to cancer. Please refer to our prior cancer genetics clinic note for more information regarding our discussion, assessment and recommendations, at the time. [name] recent genetic test results were disclosed to her, as were recommendations warranted by these results. These results and recommendations are discussed in more detail below.  CANCER HISTORY:  Oncology History   No history exists.    FAMILY HISTORY:  We obtained a detailed, 4-generation family history.  Significant diagnoses are listed below: Family History  Problem Relation Age of Onset  . Cancer Mother        thyroid cancer  . Heart disease Father        CHF  . Diabetes Father   . Glaucoma Father   . Arthritis Father   . Coronary artery disease Maternal Uncle   . Bipolar disorder Son   . Heart disease Sister        CHF  . Stroke Sister   . Brain cancer Grandson        astrocytoma  . Breast cancer Cousin        dx 2s   [name] has 2 sons. One of her grandsons was diagnosed with astrocytoma at age 31. Patient had 3 sisters and 1 brother, none have had cancer.   [name] mother had thyroid cancer in her late 61s-80s. She passed at 42. Patient had 3 maternal aunts, 2 maternal uncles, no cancers. A maternal cousin had esophageal cancer, and his sister had breast cancer in her 35s. No cancers for maternal grandparents.  [name] father died due to heart issues. There is no known history of cancer on this side of the family. Patient's mother and father share a grandfather.   [name] is unaware of previous family history of genetic testing for hereditary cancer risks. Patient's maternal ancestors are of [place] and European descent, and paternal ancestors are of [place]/European descent. There is no reported Ashkenazi  Jewish ancestry, she does report Sephardic Jewish ancestry. There is no known consanguinity.    GENETIC TEST RESULTS: Genetic testing reported out on 10/30/2020 through the [facility] Breast Cancer STAT Panel + Multi- cancer panel found no pathogenic mutations.   The STAT Breast cancer panel offered by [facility] includes sequencing and rearrangement analysis for the following 9 genes:  ATM, BRCA1, BRCA2, CDH1, CHEK2, PALB2, PTEN, STK11 and TP53.    The Multi-Cancer Panel offered by [facility] includes sequencing and/or deletion duplication testing of the following 85 genes: AIP, ALK, APC, ATM, AXIN2,BAP1,  BARD1, BLM, BMPR1A, BRCA1, BRCA2, BRIP1, CASR, CDC73, CDH1, CDK4, CDKN1B, CDKN1C, CDKN2A (p14ARF), CDKN2A (p16INK4a), CEBPA, CHEK2, CTNNA1, DICER1, DIS3L2, EGFR (c.2369C>T, p.Thr790Met variant only), EPCAM (Deletion/duplication testing only), FH, FLCN, GATA2, GPC3, GREM1 (Promoter region deletion/duplication testing only), HOXB13 (c.251G>A, p.Gly84Glu), HRAS, KIT, MAX, MEN1, MET, MITF (c.952G>A, p.Glu318Lys variant only), MLH1, MSH2, MSH3, MSH6, MUTYH, NBN, NF1, NF2, NTHL1, PALB2, PDGFRA, PHOX2B, PMS2, POLD1, POLE, POT1, PRKAR1A, PTCH1, PTEN, RAD50, RAD51C, RAD51D, RB1, RECQL4, RET, RNF43, RUNX1, SDHAF2, SDHA (sequence changes only), SDHB, SDHC, SDHD, SMAD4, SMARCA4, SMARCB1, SMARCE1, STK11, SUFU, TERC, TERT, TMEM127, TP53, TSC1, TSC2, VHL, WRN and WT1.   The test report has been scanned into EPIC and is located under the Molecular Pathology section of the Results Review tab.  A portion of the result report is included below for reference.  We discussed with [name] that because current genetic testing is not perfect, it is possible there may be a gene mutation in one of these genes that current testing cannot detect, but that chance is small.  We also discussed, that there could be another gene that has not yet been discovered, or that we have not yet tested, that is responsible for the cancer  diagnoses in the family. It is also possible there is a hereditary cause for the cancer in the family that [name] did not inherit and therefore was not identified in her testing.  Therefore, it is important to remain in touch with cancer genetics in the future so that we can continue to offer [name] the most up to date genetic testing.   ADDITIONAL GENETIC TESTING: We discussed with [name] that her genetic testing was fairly extensive.  If there are genes identified to increase cancer risk that can be analyzed in the future, we would be happy to discuss and coordinate this testing at that time.    CANCER SCREENING RECOMMENDATIONS: [name] test result is considered negative (normal).  This means that we have not identified a hereditary cause for her  personal and family history of cancer at this time. Most cancers happen by chance and this negative test suggests that her cancer may fall into this category.    While reassuring, this does not definitively rule out a hereditary predisposition to cancer. It is still possible that there could be genetic mutations that are undetectable by current technology. There could be genetic mutations in genes that have not been tested or identified to increase cancer risk.  Therefore, it is recommended she continue to follow the cancer management and screening guidelines provided by her oncology and primary healthcare provider.   An individual's cancer risk and medical management are not determined by genetic test results alone. Overall cancer risk assessment incorporates additional factors, including personal medical history, family history, and any available genetic information that may result in a personalized plan for cancer prevention and surveillance.  RECOMMENDATIONS FOR FAMILY MEMBERS:  Relatives in this family might be at some increased risk of developing cancer, over the general population risk, simply due to the family history of cancer.  We  recommended female relatives in this family have a yearly mammogram beginning at age 60, or 68 years younger than the earliest onset of cancer, an annual clinical breast exam, and perform monthly breast self-exams. Female relatives in this family should also have a gynecological exam as recommended by their primary provider.  All family members should be referred for colonoscopy starting at age 12.   FOLLOW-UP: Lastly, we discussed with [name] that cancer genetics is a rapidly advancing field and it is possible that new genetic tests will be appropriate for her and/or her family members in the future. We encouraged her to remain in contact with cancer genetics on an annual basis so we can update her personal and family histories and let her know of advances in cancer genetics that may benefit this family.   Our contact number was provided. [name] questions were answered to her satisfaction, and she knows she is welcome to call us at anytime with additional questions or concerns.   Faith Rogue, MS, [facility] Genetic Counselor [email or web address] Phone: [phone]

## 2020-10-31 DIAGNOSIS — Z780 Asymptomatic menopausal state: Secondary | ICD-10-CM | POA: Diagnosis not present

## 2020-10-31 DIAGNOSIS — Z79811 Long term (current) use of aromatase inhibitors: Secondary | ICD-10-CM | POA: Diagnosis not present

## 2020-10-31 DIAGNOSIS — R922 Inconclusive mammogram: Secondary | ICD-10-CM | POA: Diagnosis not present

## 2020-10-31 DIAGNOSIS — C50812 Malignant neoplasm of overlapping sites of left female breast: Secondary | ICD-10-CM | POA: Diagnosis not present

## 2020-10-31 DIAGNOSIS — N6313 Unspecified lump in the right breast, lower outer quadrant: Secondary | ICD-10-CM | POA: Diagnosis not present

## 2020-10-31 DIAGNOSIS — R928 Other abnormal and inconclusive findings on diagnostic imaging of breast: Secondary | ICD-10-CM | POA: Diagnosis not present

## 2020-10-31 DIAGNOSIS — M8589 Other specified disorders of bone density and structure, multiple sites: Secondary | ICD-10-CM | POA: Diagnosis not present

## 2020-10-31 DIAGNOSIS — Z170 Estrogen receptor positive status [ER+]: Secondary | ICD-10-CM | POA: Diagnosis not present

## 2020-10-31 LAB — HM DEXA SCAN

## 2020-11-04 DIAGNOSIS — C50912 Malignant neoplasm of unspecified site of left female breast: Secondary | ICD-10-CM | POA: Diagnosis not present

## 2020-11-04 DIAGNOSIS — Z170 Estrogen receptor positive status [ER+]: Secondary | ICD-10-CM | POA: Diagnosis not present

## 2020-11-04 DIAGNOSIS — C50812 Malignant neoplasm of overlapping sites of left female breast: Secondary | ICD-10-CM | POA: Diagnosis not present

## 2020-11-11 ENCOUNTER — Telehealth: Payer: Self-pay

## 2020-11-11 ENCOUNTER — Telehealth: Payer: Self-pay | Admitting: *Deleted

## 2020-11-11 NOTE — Telephone Encounter (Signed)
Patient called and says she has an appointment for COVID testing on Friday at [facility] on [facility] and asks exactly where it is located. Advised of address and that it's located at the hospital. She verbalized understanding.

## 2020-11-11 NOTE — Telephone Encounter (Signed)
Yes recommend she go get tested.  Results should be back before scheduled vaccine date.  If positive, let us know, would recommend postpone covid booster. If negative and pt starts feeling better, ok to proceed with covid booster.

## 2020-11-11 NOTE — Telephone Encounter (Signed)
Patient notified as instructed by telephone and verbalized understanding. Patient stated that she will probably call the [facility] and see about them testing her since they have tested her several times previously. Patient stated that she will keep Dr. Danise Mina informed.

## 2020-11-11 NOTE — Telephone Encounter (Signed)
Patient called stating that she is scheduled to get her covid booster vaccine 11/15/20. Patient stated that she started feeling bad Saturday. Patient stated that she has a productive cough/light yellow and runny nose. Patient stated that she has had chills and is on Keflex from an infection that she got from her mastectomy. Patient denies chest pain, SOB or difficulty breathing. Patient stated that she is wondering if she should go ahead and get tested for covid. Patient stated that she has had limited contact with her son that has not been vaccinated. Patient stated that she has been more tired and sleeping more. Patient wants to know about being tested and should she reschedule her covid booster vaccine?

## 2020-11-12 ENCOUNTER — Ambulatory Visit (INDEPENDENT_AMBULATORY_CARE_PROVIDER_SITE_OTHER): Payer: Medicare Other | Admitting: Psychology

## 2020-11-12 DIAGNOSIS — F4323 Adjustment disorder with mixed anxiety and depressed mood: Secondary | ICD-10-CM | POA: Diagnosis not present

## 2020-11-15 ENCOUNTER — Ambulatory Visit: Payer: Medicare Other

## 2020-11-18 DIAGNOSIS — R928 Other abnormal and inconclusive findings on diagnostic imaging of breast: Secondary | ICD-10-CM | POA: Diagnosis not present

## 2020-11-18 DIAGNOSIS — Z170 Estrogen receptor positive status [ER+]: Secondary | ICD-10-CM | POA: Diagnosis not present

## 2020-11-18 DIAGNOSIS — C50812 Malignant neoplasm of overlapping sites of left female breast: Secondary | ICD-10-CM | POA: Diagnosis not present

## 2020-11-18 DIAGNOSIS — N6091 Unspecified benign mammary dysplasia of right breast: Secondary | ICD-10-CM | POA: Diagnosis not present

## 2020-11-18 LAB — HM MAMMOGRAPHY

## 2020-11-26 ENCOUNTER — Ambulatory Visit (INDEPENDENT_AMBULATORY_CARE_PROVIDER_SITE_OTHER): Payer: Medicare Other | Admitting: Psychology

## 2020-11-26 DIAGNOSIS — C50812 Malignant neoplasm of overlapping sites of left female breast: Secondary | ICD-10-CM | POA: Diagnosis not present

## 2020-11-26 DIAGNOSIS — F4323 Adjustment disorder with mixed anxiety and depressed mood: Secondary | ICD-10-CM

## 2020-11-26 DIAGNOSIS — M2550 Pain in unspecified joint: Secondary | ICD-10-CM | POA: Diagnosis not present

## 2020-11-26 DIAGNOSIS — Z08 Encounter for follow-up examination after completed treatment for malignant neoplasm: Secondary | ICD-10-CM | POA: Diagnosis not present

## 2020-11-26 DIAGNOSIS — C50912 Malignant neoplasm of unspecified site of left female breast: Secondary | ICD-10-CM | POA: Diagnosis not present

## 2020-11-26 DIAGNOSIS — Z853 Personal history of malignant neoplasm of breast: Secondary | ICD-10-CM | POA: Diagnosis not present

## 2020-11-26 DIAGNOSIS — Z170 Estrogen receptor positive status [ER+]: Secondary | ICD-10-CM | POA: Diagnosis not present

## 2020-11-29 ENCOUNTER — Telehealth: Payer: Self-pay

## 2020-11-29 NOTE — Telephone Encounter (Signed)
I spoke with pt; pt is not in any distress with her breathing; pt said she has already spoken with access nurse; no CP or SOB; pt has prod cough with clear phlegm; once or twice had yellow phlegm. No fever. Pt already has appt with Dr Darnell Level on 12/03/20, pt did not want to do [facility] and wants to see Dr Darnell Level on video visit. UC & ED precautions given and pt voiced understanding. Pt said her husband wanted to know about a flu shot and I advised should wait to get flu shot when she was not having respiratory issues. Pt wanted to know if should get covid booster shot while sick and I advised no. Pt said she would talk with Dr Darnell Level on 12/03/20.

## 2020-11-29 NOTE — Telephone Encounter (Signed)
[facility] - Client TELEPHONE ADVICE RECORD [facility] Patient Name: Beth Rodgers Gender: Female DOB: [birth year] Age: 85 Y 90 M 16 D Return Phone Number: [phone] (Primary) Address: City/State/Zip: [place] [address] Client [facility] - Client Client Site [place] - Day Physician Ria Bush - MD Contact Type Call Who Is Calling Patient / Member / Family / Caregiver Call Type Triage / Clinical Relationship To Patient Self Return Phone Number [phone] (Primary) Chief Complaint Cough Reason for Call Symptomatic / Request for Health Information Initial Comment Caller states patient has cough with congestion Translation No Nurse Assessment Nurse: Glean Salvo, RN, Magda Paganini Date/Time Eilene Ghazi Time): 11/29/2020 1:01:49 PM Confirm and document reason for call. If symptomatic, describe symptoms. ---Caller states she is having cough and congestion that started with nasal congestion over ten days ago, was just on abx following lumpectomy. Does the patient have any new or worsening symptoms? ---Yes Will a triage be completed? ---Yes Related visit to physician within the last 2 weeks? ---No Does the PT have any chronic conditions? (i.e. diabetes, asthma, this includes High risk factors for pregnancy, etc.) ---Yes List chronic conditions. ---Cancer, peripheral circulation problems, glaucoma, hypothyroid Is this a behavioral health or substance abuse call? ---No Guidelines Guideline Title Affirmed Question Affirmed Notes Nurse Date/Time (Eastern Time) COVID-19 - Diagnosed or Suspected HIGH RISK for severe COVID complications (e.g., age > 58 years, obesity with BMI > 25, pregnant, chronic lung disease or other chronic medical condition) (Exception: Already seen by PCP and no new or worsening symptoms.) Rebecca Eaton 11/29/2020 1:04:55 PM Disp. Time Eilene Ghazi Time) Disposition Final  User 11/29/2020 1:09:27 PM Call PCP Now Yes Glean Salvo, RN, Abby Potash NOTE: All timestamps contained within this report are represented as [place] Standard Time. CONFIDENTIALTY NOTICE: This fax transmission is intended only for the addressee. It contains information that is legally privileged, confidential or otherwise protected from use or disclosure. If you are not the intended recipient, you are strictly prohibited from reviewing, disclosing, copying using or disseminating any of this information or taking any action in reliance on or regarding this information. If you have received this fax in error, please notify us immediately by telephone so that we can arrange for its return to [place]. Phone: [phone], Toll-Free: [phone], Fax: [phone] Page: 2 of 2 Call Id: [id number] Elbert Disagree/Comply Comply Caller Understands Yes PreDisposition Call Doctor Care Advice Given Per Guideline CALL PCP NOW: * You need to discuss this with your doctor (or NP/PA). CALL BACK IF: * You become worse CARE ADVICE given per COVID-19 - DIAGNOSED OR SUSPECTED (Adult) guideline. COUGH MEDICINES: * COUGH DROPS: Overthe- counter cough drops can help a lot, especially for mild coughs. They soothe an irritated throat and remove the tickle sensation in the back of the throat. Cough drops are easy to carry with you. * COUGH SYRUP WITH DEXTROMETHORPHAN: An over-the-counter cough syrup can help your cough. The most common cough suppressant in over-the-counter cough medicines is dextromethorphan. * HOME REMEDY - HONEY: This old home remedy has been shown to help decrease coughing at night. The adult dosage is 2 teaspoons (10 ml) at bedtime. Honey should not be given to infants under one year of age. HOW TO PROTECT OTHERS - WHEN YOU ARE SICK WITH COVID-19: * STAY HOME A MINIMUM OF 10 DAYS: Home isolation is needed for at least 10 days after the symptoms started. Stay home from school or work if you are sick. Do  NOT go  to religious services, child care centers, shopping, or other public places. Do NOT use public transportation (e.g., bus, taxis, ride-sharing). Do NOT allow any visitors to your home. Leave the house only if you need to seek urgent medical care. * COVER THE COUGH: Cough and sneeze into your shirt sleeve or inner elbow. Don't cough into your hand or the air. If available, cough into a tissue and throw it into a trash can. * [place] HANDS OFTEN: Wash hands often with soap and water. After coughing or sneezing are important times. If soap and water are not available, use an alcohol-based hand sanitizer with at least 60% alcohol, covering all surfaces of your hands and rubbing them together until they feel dry. Avoid touching your eyes, nose, and mouth with unwashed hands. Comments User: Beulah Gandy, RN Date/Time Eilene Ghazi Time): 11/29/2020 1:13:57 PM Unable to reach back-line for transfer User: Beulah Gandy, RN Date/Time Eilene Ghazi Time): 11/29/2020 1:14:51 PM Caller states she already has appt for Tuesday but wants one sooner. Referrals REFERRED TO PCP OFFICE

## 2020-12-02 ENCOUNTER — Encounter (INDEPENDENT_AMBULATORY_CARE_PROVIDER_SITE_OTHER): Payer: Self-pay | Admitting: Vascular Surgery

## 2020-12-02 ENCOUNTER — Ambulatory Visit (INDEPENDENT_AMBULATORY_CARE_PROVIDER_SITE_OTHER): Payer: Medicare Other | Admitting: Vascular Surgery

## 2020-12-02 ENCOUNTER — Other Ambulatory Visit: Payer: Self-pay

## 2020-12-02 VITALS — BP 126/76 | HR 73 | Resp 16 | Wt 175.0 lb

## 2020-12-02 DIAGNOSIS — M8949 Other hypertrophic osteoarthropathy, multiple sites: Secondary | ICD-10-CM | POA: Diagnosis not present

## 2020-12-02 DIAGNOSIS — I10 Essential (primary) hypertension: Secondary | ICD-10-CM | POA: Diagnosis not present

## 2020-12-02 DIAGNOSIS — I890 Lymphedema, not elsewhere classified: Secondary | ICD-10-CM

## 2020-12-02 DIAGNOSIS — I872 Venous insufficiency (chronic) (peripheral): Secondary | ICD-10-CM | POA: Diagnosis not present

## 2020-12-02 DIAGNOSIS — M159 Polyosteoarthritis, unspecified: Secondary | ICD-10-CM

## 2020-12-02 MED ORDER — SILVER SULFADIAZINE 1 % EX CREA: 1.0000 "application " | TOPICAL_CREAM | Freq: Every day | CUTANEOUS | 2 refills | Status: DC

## 2020-12-02 NOTE — Progress Notes (Signed)
MRN : [id number]  Beth Rodgers is a 85 y.o. (08-14-1935) female who presents with chief complaint of  Chief Complaint  Patient presents with  . Follow-up    97yr follow up  .  History of Present Illness:   The patient returns to the office for followup evaluation regarding leg swelling. The swelling has persisted but with the lymph pump is much, much better controlled. The pain associated with swelling is essentially eliminated. There have not been any interval development of a ulcerations or wounds.  She has not had any interval episodes of cellulitis.  She has not been to [place] for approximately 2 years now because of Covid.  Since the previous visit the patient has been wearing graduated compression stockings and using the lymph pump on a routine basis and has noted good control of  the lymphedema.  Interval change in her health care.  Dr. Tildon Husky DS performed a left lumpectomy for breast carcinoma.  Currently she is being maintained on exemestane, there is concerns regarding blood clots with tamoxifen.  Current Meds  Medication Sig  . alendronate (FOSAMAX) 70 MG tablet   . Ascorbic Acid (VITAMIN C CR PO) Take 500 mg by mouth daily.   . calcium carbonate (TUMS EX) 750 MG chewable tablet Chew 1 tablet by mouth as needed for heartburn.   . Cholecalciferol (VITAMIN D) 50 MCG (2000 UT) CAPS Take 1 capsule (2,000 Units total) by mouth daily.  Stasia Cavalier (EUCRISA) 2 % OINT Apply 1 application topically daily as needed (Sore on legs).   Marland Kitchen desonide (DESOWEN) 0.05 % cream Apply 1 application topically 3 (three) times a week.  . diclofenac sodium (VOLTAREN) 1 % GEL Apply 1 application topically 3 (three) times daily.  Marland Kitchen exemestane (AROMASIN) 25 MG tablet   . fluticasone (CUTIVATE) 0.05 % cream Apply topically daily. For max 2 wks at a time (Patient taking differently: Apply 1 application topically daily as needed (toe fungus). For max 2 wks at a time)  . halobetasol  (ULTRAVATE) 0.05 % ointment Apply 1 application topically 4 (four) times a week.   . levothyroxine (SYNTHROID) 75 MCG tablet Take 1 tablet (75 mcg total) by mouth daily. (Patient taking differently: Take 75 mcg by mouth daily before breakfast. )  . losartan (COZAAR) 100 MG tablet Take 1 tablet (100 mg total) by mouth daily. (Patient taking differently: Take 100 mg by mouth at bedtime. )  . Multiple Vitamin (MULTIVITAMIN) tablet Take 1 tablet by mouth daily.   . Multiple Vitamins-Minerals (ONE DAILY CALCIUM/IRON) TABS Take 1 tablet by mouth daily.  Marland Kitchen NITROSTAT 0.4 MG SL tablet Place 0.4 mg under the tongue every 5 (five) minutes as needed for chest pain.   . silver sulfADIAZINE (SILVADENE) 1 % cream Apply 1 application topically daily.   . Travoprost, BAK Free, (TRAVATAN) 0.004 % SOLN ophthalmic solution Place 1 drop into both eyes at bedtime.    Past Medical History:  Diagnosis Date  . Basal cell carcinoma (BCC)   . Cellulitis 08/10/2018  . Chronic venous insufficiency    with varicose veins  . Family history of breast cancer   . Family history of thyroid cancer   . GERD (gastroesophageal reflux disease)   . Glaucoma   . History  of basal cell carcinoma    left nare/BREAST CANCER  . History of chicken pox   . Hypertension   . Hypoglycemia   . Hypothyroid   . Osteoarthritis    back pain, L  knee pain  . Osteopenia 06/2009   DEXA 11/2015: T -2.3 hip, -2.2 spine, on longterm bisphosphonate/evista  . Pyogenic granuloma 04/16/2016    Past Surgical History:  Procedure Laterality Date  . BASAL CELL CARCINOMA EXCISION     located on face  . BREAST BIOPSY Left    core done ing [place]?  Marland Kitchen BREAST BIOPSY Left 08/16/2020   3 area bx 4:00 X, IMC, 6:00Q IMC, LN hydro #3-benign  . BREAST LUMPECTOMY Left 09/11/2020   two areas of [facility]  . CATARACT EXTRACTION     bilateral  . DEXA  06/2009   T score Spine: -1.8, hip -2.0  . EXCISION OF BREAST BIOPSY Left 09/11/2020   Procedure: EXCISION OF  BREAST BIOPSY;  Surgeon: [name], MD;  Location: [facility] ORS;  Service: General;  Laterality: Left;  . Foam sclerotherapy Bilateral 09/2015   [place], [place]  . NASAL RECONSTRUCTION  2005   for BCC L nare s/p Mohs  . nuclear stress test  2014   normal in [place]  . PARTIAL MASTECTOMY WITH NEEDLE LOCALIZATION AND AXILLARY SENTINEL LYMPH NODE BX Left 09/11/2020   Procedure: PARTIAL MASTECTOMY WITH NEEDLE LOCALIZATION AND AXILLARY SENTINEL LYMPH NODE BX;  Surgeon: [name], MD;  Location: [facility] ORS;  Service: General;  Laterality: Left;  . RADIOFREQUENCY ABLATION Left 01/2012   remnant L G saphenous vein below knee  . RADIOFREQUENCY ABLATION Right 02/2013   RLE vein ablation   . VEIN LIGATION AND STRIPPING  remote   bilateral G saphenous veins    Social History Social History   Tobacco Use  . Smoking status: Never Smoker  . Smokeless tobacco: Never Used  Vaping Use  . Vaping Use: Never used  Substance Use Topics  . Alcohol use: Yes    Comment: Occasionally drinks wine  . Drug use: No    Family History Family History  Problem Relation Age of Onset  . Cancer Mother        thyroid cancer  . Heart disease Father        CHF  . Diabetes Father   . Glaucoma Father   . Arthritis Father   . Coronary artery disease Maternal Uncle   . Bipolar disorder Son   . Heart disease Sister        CHF  . Stroke Sister   . Brain cancer Grandson        astrocytoma  . Breast cancer Cousin        dx 33s    No Known Allergies   REVIEW OF SYSTEMS (Negative unless checked)  Constitutional: [] Weight loss  [] Fever  [] Chills Cardiac: [] Chest pain   [] Chest pressure   [] Palpitations   [] Shortness of breath when laying flat   [] Shortness of breath with exertion. Vascular:  [] Pain in legs with walking   [x] Pain in legs at rest  [] History of DVT   [] Phlebitis   [x] Swelling in legs   [] Varicose veins   [] Non-healing ulcers Pulmonary:   [] Uses home oxygen    [] Productive cough   [] Hemoptysis   [] Wheeze  [] COPD   [] Asthma Neurologic:  [] Dizziness   [] Seizures   [] History of stroke   [] History of TIA  [] Aphasia   [] Vissual changes   [] Weakness or numbness in arm   [] Weakness or numbness in leg Musculoskeletal:   [] Joint swelling   [] Joint pain   [] Low back pain Hematologic:  [] Easy bruising  [] Easy bleeding   [] Hypercoagulable state   [] Anemic Gastrointestinal:  [] Diarrhea   []   Vomiting  [] Gastroesophageal reflux/heartburn   [] Difficulty swallowing. Genitourinary:  [] Chronic kidney disease   [] Difficult urination  [] Frequent urination   [] Blood in urine Skin:  [] Rashes   [] Ulcers  Psychological:  [] History of anxiety   []  History of major depression.  Physical Examination  Vitals:   12/02/20 1058  BP: 126/76  Pulse: 73  Resp: 16  Weight: 175 lb (79.4 kg)   Body mass index is 25.84 kg/m. Gen: WD/WN, NAD Head: [place]/AT, No temporalis wasting.  Ear/Nose/Throat: Hearing grossly intact, nares w/o erythema or drainage Eyes: PER, EOMI, sclera nonicteric.  Neck: Supple, no large masses.   Pulmonary:  Good air movement, no audible wheezing bilaterally, no use of accessory muscles.  Cardiac: RRR, no JVD Vascular: scattered varicosities present bilaterally.  moderate to severe venous stasis changes to the legs bilaterally.  3+ firm pitting edema. Vessel Right Left  Radial Palpable Palpable  PT Palpable Palpable  DP Palpable Palpable  Gastrointestinal: Non-distended. No guarding/no peritoneal signs.  Musculoskeletal: M/S 5/5 throughout.  No deformity or atrophy.  Neurologic: CN 2-12 intact. Symmetrical.  Speech is fluent. Motor exam as listed above. Psychiatric: Judgment intact, Mood & affect appropriate for pt's clinical situation. Dermatologic: Moderate to severe venous rashes no ulcers noted.  No changes consistent with cellulitis. Lymph : + skin changes of chronic lymphedema.  CBC Lab Results  Component Value Date   WBC 7.4 08/21/2020   HGB  13.7 08/21/2020   HCT 40.6 08/21/2020   MCV 85.8 08/21/2020   PLT 221 08/21/2020    BMET    Component Value Date/Time   NA 140 08/21/2020 1625   K 4.2 08/21/2020 1625   CL 106 08/21/2020 1625   CO2 25 08/21/2020 1625   GLUCOSE 101 (H) 08/21/2020 1625   BUN 24 (H) 08/21/2020 1625   CREATININE 0.77 08/21/2020 1625   CALCIUM 9.1 08/21/2020 1625   GFRNONAA >60 08/21/2020 1625   GFRAA >60 08/21/2020 1625   CrCl cannot be calculated (Patient's most recent lab result is older than the maximum 21 days allowed.).  COAG No results found for: INR, PROTIME  Radiology No results found.  Assessment/Plan 1. Lymphedema No surgery or intervention at this point in time.   I have reviewed my discussion with the patient regarding lymphedema and why it causes symptoms. Patient will continue wearing graduated compression stockings class 1 (20-30 mmHg) on a daily basis a prescription was given. The patient is reminded to put the stockings on first thing in the morning and removing them in the evening. The patient is instructed specifically not to sleep in the stockings.   In addition, behavioral modification throughout the day will be continued. This will include frequent elevation (such as in a recliner), use of over the counter pain medications as needed and exercise such as walking.  The patient has successfully treated several locations where she is developed skin breaks with Silvadene cream and this is prevented any further episodes of cellulitis.  Given this good result I will refill the Silvadene cream so she has this available to use on an as-needed basis.  The patient will continue aggressive use of the lymph pump. This will continue to improve the edema control and prevent sequela such as ulcers and infections.   The patient will follow-up with me on an annual basis.  2. Chronic venous insufficiency No surgery or intervention at this point in time.   I have reviewed my  discussion with the patient regarding lymphedema and why it causes  symptoms. Patient will continue wearing graduated compression stockings class 1 (20-30 mmHg) on a daily basis a prescription was given. The patient is reminded to put the stockings on first thing in the morning and removing them in the evening. The patient is instructed specifically not to sleep in the stockings.   In addition, behavioral modification throughout the day will be continued. This will include frequent elevation (such as in a recliner), use of over the counter pain medications as needed and exercise such as walking.  The patient has successfully treated several locations where she is developed skin breaks with Silvadene cream and this is prevented any further episodes of cellulitis.  Given this good result I will refill the Silvadene cream so she has this available to use on an as-needed basis.  The patient will continue aggressive use of the lymph pump. This will continue to improve the edema control and prevent sequela such as ulcers and infections.   The patient will follow-up with me on an annual basis.  3. Essential hypertension Continue antihypertensive medications as already ordered, these medications have been reviewed and there are no changes at this time.   4. Primary osteoarthritis involving multiple joints Continue NSAID medications as already ordered, these medications have been reviewed and there are no changes at this time.  Continued activity and therapy was stressed.   Hortencia Pilar, MD  12/02/2020 11:04 AM

## 2020-12-02 NOTE — Telephone Encounter (Signed)
Will see tomorrow

## 2020-12-03 ENCOUNTER — Encounter: Payer: Self-pay | Admitting: Family Medicine

## 2020-12-03 ENCOUNTER — Telehealth (INDEPENDENT_AMBULATORY_CARE_PROVIDER_SITE_OTHER): Payer: Medicare Other | Admitting: Family Medicine

## 2020-12-03 VITALS — BP 124/78 | Ht 69.0 in | Wt 176.0 lb

## 2020-12-03 DIAGNOSIS — Z170 Estrogen receptor positive status [ER+]: Secondary | ICD-10-CM | POA: Diagnosis not present

## 2020-12-03 DIAGNOSIS — C50812 Malignant neoplasm of overlapping sites of left female breast: Secondary | ICD-10-CM

## 2020-12-03 DIAGNOSIS — M85851 Other specified disorders of bone density and structure, right thigh: Secondary | ICD-10-CM

## 2020-12-03 DIAGNOSIS — J209 Acute bronchitis, unspecified: Secondary | ICD-10-CM | POA: Diagnosis not present

## 2020-12-03 NOTE — Assessment & Plan Note (Signed)
Fosamax restarted by onc 10/2020

## 2020-12-03 NOTE — Assessment & Plan Note (Addendum)
Appreciate onc and surgical care.  Seeing [facility] oncology Dr Marolyn Hammock.  Currently on Exemestane aromatase inhibitor

## 2020-12-03 NOTE — Assessment & Plan Note (Addendum)
3 wks of cough without fever, purulent sputum or other sign of bacterial infection - doubt pneumonia, anticipate viral bronchitis, especially as symptoms seem to be improving over the last 24-48 hours. Supportive care reviewed. Red flags to update [place] for abx course reviewed. Pt agrees with plan. Low threshold to cover with antibiotic if any worsening.

## 2020-12-03 NOTE — Progress Notes (Signed)
Patient ID: Beth Rodgers, female    DOB: 1935/11/23, 85 y.o.   MRN: [id number]  Virtual visit completed through [place], a video enabled telemedicine application. Due to national recommendations of social distancing due to COVID-19, a virtual visit is felt to be most appropriate for this patient at this time. Reviewed limitations, risks, security and privacy concerns of performing a virtual visit and the availability of in person appointments. I also reviewed that there may be a patient responsible charge related to this service. The patient agreed to proceed.   Interactive audio and video telecommunications were attempted between myself and Beth Rodgers, however failed due to patient having technical difficulties OR patient not having access to video capability.  We continued and completed visit with audio only.  Time: 12:28pm - 12:54pm   Patient location: home Provider location: [place] at [facility], office Persons participating in this virtual visit: patient, provider   If any vitals were documented, they were collected by patient at home unless specified below.    BP 124/78   Ht 5\' 9"  (1.753 m)   Wt 176 lb (79.8 kg)   BMI 25.99 kg/m    CC: cough, congestion Subjective:   HPI: Beth Rodgers is a 85 y.o. female presenting on 12/03/2020 for Cough (C/o coughing episodes with clear mucous- sometimes yellow and chest congestion.   Sxs started about 3 wks ago in the head.  Now in the chest.  Seen for similar sxs about 6 mos ago. )   3 wk h/o cough productive of clear to yellow mucous, chest > head congestion, fatigue.  Started as head cold, now progressed into lungs.  Actually over the past 24-48 hours symptoms seem to be improving.  No fevers/chills, body aches, malaise, HA, ear or tooth pain, ST, dyspnea. No loss of appetite, taste or smell.  No sick contacts at home. She is covid vaccinated Therapist, music). Has not yet received booster - pending when she feels better.  No h/o asthma  or smoking.   Managing with OTC cough syrup.   Had R breast biopsy - returned atypical ductal hyperplasia without malignancy. Continues antiestrogen therapy (Exemestane aromatase inhibitor) for L breast cancer, considering total mastectomy - sees surgery, oncology. Saw Dr Waldo Laine onc for second opinion. Arimidex caused back pain.   She did receive keflex course for L breast mastitis 10/2020. She already had cough at this time.  Prior L breast biopsy complicated by infected seroma/abscess s/p augmentin course followed by gen surg.   From prior note: Recent dx L breast cancer 07/2020 with 2 separate cancer foci - 4pm invasive multifocal mammary carcinoma, 6pm invasive mammary lobular carcinoma. MRI showed nonspecific subareolar changes. Underwent L breast lumpectomy/partial mastectomy 09/11/2020 and sentinel lymph node biopsy of L axilla, as well as L retroareolar breast excisional biopsy.      Relevant past medical, surgical, family and social history reviewed and updated as indicated. Interim medical history since our last visit reviewed. Allergies and medications reviewed and updated. Outpatient Medications Prior to Visit  Medication Sig Dispense Refill  . alendronate (FOSAMAX) 70 MG tablet     . Ascorbic Acid (VITAMIN C CR PO) Take 500 mg by mouth daily.     . calcium carbonate (TUMS EX) 750 MG chewable tablet Chew 1 tablet by mouth as needed for heartburn.     . Cholecalciferol (VITAMIN D) 50 MCG (2000 UT) CAPS Take 1 capsule (2,000 Units total) by mouth daily. 30 capsule   .  Crisaborole (EUCRISA) 2 % OINT Apply 1 application topically daily as needed (Sore on legs).     Marland Kitchen desonide (DESOWEN) 0.05 % cream Apply 1 application topically 3 (three) times a week.    . diclofenac sodium (VOLTAREN) 1 % GEL Apply 1 application topically 3 (three) times daily. 1 Tube 0  . exemestane (AROMASIN) 25 MG tablet     . fluticasone (CUTIVATE) 0.05 % cream Apply topically daily. For max 2 wks at a time  (Patient taking differently: Apply 1 application topically daily as needed (toe fungus). For max 2 wks at a time) 30 g 0  . halobetasol (ULTRAVATE) 0.05 % ointment Apply 1 application topically 4 (four) times a week.   1  . levothyroxine (SYNTHROID) 75 MCG tablet Take 1 tablet (75 mcg total) by mouth daily. (Patient taking differently: Take 75 mcg by mouth daily before breakfast. ) 90 tablet 3  . losartan (COZAAR) 100 MG tablet Take 1 tablet (100 mg total) by mouth daily. (Patient taking differently: Take 100 mg by mouth at bedtime. ) 90 tablet 3  . Multiple Vitamin (MULTIVITAMIN) tablet Take 1 tablet by mouth daily.     . Multiple Vitamins-Minerals (ONE DAILY CALCIUM/IRON) TABS Take 1 tablet by mouth daily.    Marland Kitchen NITROSTAT 0.4 MG SL tablet Place 0.4 mg under the tongue every 5 (five) minutes as needed for chest pain.     . silver sulfADIAZINE (SILVADENE) 1 % cream Apply 1 application topically daily. 50 g 2  . Travoprost, BAK Free, (TRAVATAN) 0.004 % SOLN ophthalmic solution Place 1 drop into both eyes at bedtime.     No facility-administered medications prior to visit.     Per HPI unless specifically indicated in ROS section below Review of Systems Objective:  BP 124/78   Ht 5\' 9"  (1.753 m)   Wt 176 lb (79.8 kg)   BMI 25.99 kg/m   Wt Readings from Last 3 Encounters:  12/03/20 176 lb (79.8 kg)  12/02/20 175 lb (79.4 kg)  10/09/20 174 lb 7 oz (79.1 kg)       Physical exam: Pulm: speaks in complete sentences without increased work of breathing, cough intermittently present     Results for orders placed or performed during the hospital encounter of 10/02/20  Glucose, capillary  Result Value Ref Range   Glucose-Capillary 92 70 - 99 mg/dL   Assessment & Plan:   Problem List Items Addressed This Visit    Osteopenia    Fosamax restarted by onc 10/2020      Malignant neoplasm of overlapping sites of left breast in female, estrogen receptor positive ([place])    Appreciate onc and  surgical care.  Seeing [facility] oncology Dr Marolyn Hammock.  Currently on Exemestane aromatase inhibitor      Acute bronchitis - Primary    3 wks of cough without fever, purulent sputum or other sign of bacterial infection - doubt pneumonia, anticipate viral bronchitis, especially as symptoms seem to be improving over the last 24-48 hours. Supportive care reviewed. Red flags to update [place] for abx course reviewed. Pt agrees with plan. Low threshold to cover with antibiotic if any worsening.           No orders of the defined types were placed in this encounter.  No orders of the defined types were placed in this encounter.   I discussed the assessment and treatment plan with the patient. The patient was provided an opportunity to ask questions and all were answered. The patient  agreed with the plan and demonstrated an understanding of the instructions. The patient was advised to call back or seek an in-person evaluation if the symptoms worsen or if the condition fails to improve as anticipated.  Follow up plan: No follow-ups on file.  Ria Bush, MD

## 2020-12-09 ENCOUNTER — Ambulatory Visit: Payer: Medicare Other | Attending: Internal Medicine

## 2020-12-09 DIAGNOSIS — Z23 Encounter for immunization: Secondary | ICD-10-CM

## 2020-12-09 NOTE — Progress Notes (Signed)
   Covid-19 Vaccination Clinic  Name:  NAYDELINE MORACE    MRN: [id number] DOB: 1935-02-27  12/09/2020  Ms. Tedder was observed post Covid-19 immunization for 15 minutes without incident. She was provided with Vaccine Information Sheet and instruction to access the V-Safe system.   Ms. Martello was instructed to call 911 with any severe reactions post vaccine: Marland Kitchen Difficulty breathing  . Swelling of face and throat  . A fast heartbeat  . A bad rash all over body  . Dizziness and weakness   Immunizations Administered    Name Date Dose VIS Date Route   Pfizer COVID-19 Vaccine 12/09/2020  2:49 PM 0.3 mL 10/16/2020 Intramuscular   Manufacturer: [employer]   Lot: [id number]   NDC: [address]-4

## 2020-12-10 ENCOUNTER — Ambulatory Visit (INDEPENDENT_AMBULATORY_CARE_PROVIDER_SITE_OTHER): Payer: Medicare Other | Admitting: Psychology

## 2020-12-10 DIAGNOSIS — F4323 Adjustment disorder with mixed anxiety and depressed mood: Secondary | ICD-10-CM | POA: Diagnosis not present

## 2020-12-11 DIAGNOSIS — H401123 Primary open-angle glaucoma, left eye, severe stage: Secondary | ICD-10-CM | POA: Diagnosis not present

## 2020-12-24 ENCOUNTER — Ambulatory Visit (INDEPENDENT_AMBULATORY_CARE_PROVIDER_SITE_OTHER): Payer: Medicare Other | Admitting: Psychology

## 2020-12-24 DIAGNOSIS — F4323 Adjustment disorder with mixed anxiety and depressed mood: Secondary | ICD-10-CM | POA: Diagnosis not present

## 2021-01-04 ENCOUNTER — Other Ambulatory Visit: Payer: Self-pay

## 2021-01-04 ENCOUNTER — Encounter: Payer: Self-pay | Admitting: Emergency Medicine

## 2021-01-04 ENCOUNTER — Emergency Department: Payer: Medicare Other

## 2021-01-04 ENCOUNTER — Emergency Department
Admission: EM | Admit: 2021-01-04 | Discharge: 2021-01-04 | Disposition: A | Discharge status: Home / Self Care | Payer: Medicare Other | Attending: Emergency Medicine | Admitting: Emergency Medicine

## 2021-01-04 DIAGNOSIS — E039 Hypothyroidism, unspecified: Secondary | ICD-10-CM | POA: Insufficient documentation

## 2021-01-04 DIAGNOSIS — Z85828 Personal history of other malignant neoplasm of skin: Secondary | ICD-10-CM | POA: Insufficient documentation

## 2021-01-04 DIAGNOSIS — I712 Thoracic aortic aneurysm, without rupture, unspecified: Secondary | ICD-10-CM

## 2021-01-04 DIAGNOSIS — R0902 Hypoxemia: Secondary | ICD-10-CM | POA: Diagnosis not present

## 2021-01-04 DIAGNOSIS — Z853 Personal history of malignant neoplasm of breast: Secondary | ICD-10-CM | POA: Diagnosis not present

## 2021-01-04 DIAGNOSIS — J9811 Atelectasis: Secondary | ICD-10-CM | POA: Diagnosis not present

## 2021-01-04 DIAGNOSIS — R0789 Other chest pain: Secondary | ICD-10-CM

## 2021-01-04 DIAGNOSIS — Z79899 Other long term (current) drug therapy: Secondary | ICD-10-CM | POA: Insufficient documentation

## 2021-01-04 DIAGNOSIS — R000 Tachycardia, unspecified: Secondary | ICD-10-CM | POA: Diagnosis not present

## 2021-01-04 DIAGNOSIS — J90 Pleural effusion, not elsewhere classified: Secondary | ICD-10-CM | POA: Diagnosis not present

## 2021-01-04 DIAGNOSIS — I10 Essential (primary) hypertension: Secondary | ICD-10-CM | POA: Diagnosis not present

## 2021-01-04 DIAGNOSIS — R0602 Shortness of breath: Secondary | ICD-10-CM | POA: Diagnosis not present

## 2021-01-04 DIAGNOSIS — R079 Chest pain, unspecified: Secondary | ICD-10-CM | POA: Diagnosis not present

## 2021-01-04 DIAGNOSIS — C50919 Malignant neoplasm of unspecified site of unspecified female breast: Secondary | ICD-10-CM | POA: Diagnosis not present

## 2021-01-04 LAB — BASIC METABOLIC PANEL
Anion gap: 9 (ref 5–15)
BUN: 19 mg/dL (ref 8–23)
CO2: 27 mmol/L (ref 22–32)
Calcium: 8.9 mg/dL (ref 8.9–10.3)
Chloride: 104 mmol/L (ref 98–111)
Creatinine, Ser: 0.67 mg/dL (ref 0.44–1.00)
GFR, Estimated: >60 mL/min (ref >60)
Glucose, Bld: 98 mg/dL (ref 70–99)
Potassium: 3.8 mmol/L (ref 3.5–5.1)
Sodium: 140 mmol/L (ref 135–145)

## 2021-01-04 LAB — CBC
HCT: 41.2 % (ref 36.0–46.0)
Hemoglobin: 13.4 g/dL (ref 12.0–15.0)
MCH: 28.3 pg (ref 26.0–34.0)
MCHC: 32.5 g/dL (ref 30.0–36.0)
MCV: 87.1 fL (ref 80.0–100.0)
Platelets: 226 10*3/uL (ref 150–400)
RBC: 4.73 MIL/uL (ref 3.87–5.11)
RDW: 14 % (ref 11.5–15.5)
WBC: 7.7 10*3/uL (ref 4.0–10.5)
nRBC: 0 % (ref 0.0–0.2)

## 2021-01-04 LAB — TROPONIN I (HIGH SENSITIVITY)
Troponin I (High Sensitivity): 39 ng/L — ABNORMAL HIGH (ref <18)
Troponin I (High Sensitivity): 5 ng/L (ref <18)

## 2021-01-04 MED ORDER — ACETAMINOPHEN 500 MG PO TABS
1000.0000 mg | ORAL_TABLET | Freq: Once | ORAL | Status: AC
  Administered 2021-01-04: 1000 mg via ORAL
  Filled 2021-01-04: qty 2

## 2021-01-04 MED ORDER — IOHEXOL 350 MG/ML SOLN
75.0000 mL | Freq: Once | INTRAVENOUS | Status: AC | PRN
  Administered 2021-01-04: 75 mL via INTRAVENOUS

## 2021-01-04 MED ORDER — KETOROLAC TROMETHAMINE 30 MG/ML IJ SOLN
15.0000 mg | Freq: Once | INTRAMUSCULAR | Status: AC
  Administered 2021-01-04: 15 mg via INTRAVENOUS
  Filled 2021-01-04 (×2): qty 1

## 2021-01-04 MED ORDER — NAPROXEN 375 MG PO TABS: 375.0000 mg | ORAL_TABLET | Freq: Two times a day (BID) | ORAL | 0 refills | Status: AC

## 2021-01-04 MED ORDER — FENTANYL CITRATE (PF) 100 MCG/2ML IJ SOLN
25.0000 ug | Freq: Once | INTRAMUSCULAR | Status: DC
  Filled 2021-01-04: qty 2

## 2021-01-04 NOTE — ED Notes (Signed)
Pt verbalized understanding of d/c instructions at this time and denies further questions. Pt ambulatory to lobby at this time. Steady gait noted, NAD noted

## 2021-01-04 NOTE — ED Provider Notes (Signed)
[facility] Emergency Department Provider Note  ____________________________________________   Event Date/Time   First MD Initiated Contact with Patient 01/04/21 [phone]     (approximate)  I have reviewed the triage vital signs and the nursing notes.   HISTORY  Chief Complaint Chest Pain    HPI Beth Rodgers is a 86 y.o. female  With h/o left sided breast CA, HTN, chronic venous insufficiency, here with left-sided CP. Pt reports 3 days of gradual onset, progressively worsening left-sided chest pain. Pain began along her left lower breast/chest wall, as a mild, sharp, pleuritic CP. It has remained and persisted since then. Pain is worse w/ inspiration, minimally worse with movement. No change w/ palpation. Denies any associated rash or skin lesions. No cough. No SOB other than feeling like she needs to take shallow breaths 2/2 pain. Of note, pt recently started a new med for her breast CA. No fever, chills. No cough, hemoptysis, sputum production.        Past Medical History:  Diagnosis Date  . Basal cell carcinoma (BCC)   . Cellulitis 08/10/2018  . Chronic venous insufficiency    with varicose veins  . Family history of breast cancer   . Family history of thyroid cancer   . GERD (gastroesophageal reflux disease)   . Glaucoma   . History  of basal cell carcinoma    left nare/BREAST CANCER  . History of chicken pox   . Hypertension   . Hypoglycemia   . Hypothyroid   . Osteoarthritis    back pain, L knee pain  . Osteopenia 06/2009   DEXA 11/2015: T -2.3 hip, -2.2 spine, on longterm bisphosphonate/evista  . Pyogenic granuloma 04/16/2016    Patient Active Problem List   Diagnosis Date Noted  . Genetic testing 10/30/2020  . Malignant neoplasm of overlapping sites of left breast in female, estrogen receptor positive ([place]) 10/22/2020  . Family history of breast cancer   . Family history of thyroid cancer   . Breast infection in female 10/10/2020  .  Insertional Achilles tendinopathy 08/28/2020  . Breast cancer, left breast ([place]) 08/01/2020  . Right carotid bruit 06/05/2020  . Chest pressure 06/03/2020  . Left Achilles tendinitis 09/01/2019  . Low back pain 08/29/2019  . Lumbosacral spondylosis without myelopathy 08/29/2019  . Calcaneal spur 08/17/2019  . Contusion of knee 10/04/2018  . Anterior knee pain 10/04/2018  . Varicose veins of bilateral lower extremities with pain 03/30/2018  . Lymphedema 03/30/2018  . Dizziness 02/02/2017  . Acute bronchitis 02/02/2017  . Situational depression 12/18/2016  . Vitamin D deficiency 10/11/2016  . Fatigue 08/12/2016  . 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 Essential hypertension 12/17/2015  . Dermatitis of external ear 12/17/2015  . Corn of foot 05/16/2015  . Advanced care planning/counseling discussion 10/30/2014  . Knee pain 08/17/2013  . Oropharyngeal dysphagia 11/15/2012  . Difficulty with family 03/30/2012  . Medicare annual wellness visit, subsequent 03/30/2012  . History of basal cell carcinoma   . Osteoarthritis   . Osteopenia   . Hypothyroidism   . Glaucoma   . Chronic venous insufficiency     Past Surgical History:  Procedure Laterality Date  . BASAL CELL CARCINOMA EXCISION     located on face  . BREAST BIOPSY Left    core done ing [place]?  Marland Kitchen BREAST BIOPSY Left 08/16/2020   3 area bx 4:00 X, IMC, 6:00Q IMC, LN hydro #3-benign  .  BREAST LUMPECTOMY Left 09/11/2020   two areas of [facility]  . CATARACT EXTRACTION     bilateral  . DEXA  06/2009   T score Spine: -1.8, hip -2.0  . EXCISION OF BREAST BIOPSY Left 09/11/2020   Procedure: EXCISION OF BREAST BIOPSY;  Surgeon: [name], MD;  Location: [facility] ORS;  Service: General;  Laterality: Left;  . Foam sclerotherapy Bilateral 09/2015   [place], [place]  . NASAL RECONSTRUCTION  2005   for BCC L nare s/p Mohs  . nuclear stress test  2014   normal in [place]  . PARTIAL  MASTECTOMY WITH NEEDLE LOCALIZATION AND AXILLARY SENTINEL LYMPH NODE BX Left 09/11/2020   Procedure: PARTIAL MASTECTOMY WITH NEEDLE LOCALIZATION AND AXILLARY SENTINEL LYMPH NODE BX;  Surgeon: [name], MD;  Location: [facility] ORS;  Service: General;  Laterality: Left;  . RADIOFREQUENCY ABLATION Left 01/2012   remnant L G saphenous vein below knee  . RADIOFREQUENCY ABLATION Right 02/2013   RLE vein ablation   . VEIN LIGATION AND STRIPPING  remote   bilateral G saphenous veins    Prior to Admission medications   Medication Sig Start Date End Date Taking? Authorizing Provider  naproxen (NAPROSYN) 375 MG tablet Take 1 tablet (375 mg total) by mouth 2 (two) times daily with a meal for 5 days. 01/04/21 01/09/21 Yes Duffy Bruce, MD  alendronate (FOSAMAX) 70 MG tablet  11/26/20   [provider]  Ascorbic Acid (VITAMIN C CR PO) Take 500 mg by mouth daily.     [provider]  calcium carbonate (TUMS EX) 750 MG chewable tablet Chew 1 tablet by mouth as needed for heartburn.     [provider]  Cholecalciferol (VITAMIN D) 50 MCG (2000 UT) CAPS Take 1 capsule (2,000 Units total) by mouth daily. 02/16/20   Ria Bush, MD  Crisaborole (EUCRISA) 2 % OINT Apply 1 application topically daily as needed (Sore on legs).     [provider]  desonide (DESOWEN) 0.05 % cream Apply 1 application topically 3 (three) times a week.    [provider]  diclofenac sodium (VOLTAREN) 1 % GEL Apply 1 application topically 3 (three) times daily. 05/29/19   Ria Bush, MD  exemestane (AROMASIN) 25 MG tablet  11/26/20   [provider]  fluticasone (CUTIVATE) 0.05 % cream Apply topically daily. For max 2 wks at a time Patient taking differently: Apply 1 application topically daily as needed (toe fungus). For max 2 wks at a time 06/06/20   Ria Bush, MD  halobetasol (ULTRAVATE) 0.05 % ointment Apply 1 application topically 4 (four) times a week.   03/31/18   [provider]  levothyroxine (SYNTHROID) 75 MCG tablet Take 1 tablet (75 mcg total) by mouth daily. Patient taking differently: Take 75 mcg by mouth daily before breakfast.  06/05/20   Ria Bush, MD  losartan (COZAAR) 100 MG tablet Take 1 tablet (100 mg total) by mouth daily. Patient taking differently: Take 100 mg by mouth at bedtime.  06/05/20   Ria Bush, MD  Multiple Vitamin (MULTIVITAMIN) tablet Take 1 tablet by mouth daily.     [provider]  Multiple Vitamins-Minerals (ONE DAILY CALCIUM/IRON) TABS Take 1 tablet by mouth daily.    [provider]  NITROSTAT 0.4 MG SL tablet Place 0.4 mg under the tongue every 5 (five) minutes as needed for chest pain.  07/27/13   [provider]  silver sulfADIAZINE (SILVADENE) 1 % cream Apply 1 application topically daily. 12/02/20  Schnier, Latina Craver, MD  Travoprost, BAK Free, (TRAVATAN) 0.004 % SOLN ophthalmic solution Place 1 drop into both eyes at bedtime.    [provider]    Allergies Patient has no known allergies.  Family History  Problem Relation Age of Onset  . Cancer Mother        thyroid cancer  . Heart disease Father        CHF  . Diabetes Father   . Glaucoma Father   . Arthritis Father   . Coronary artery disease Maternal Uncle   . Bipolar disorder Son   . Heart disease Sister        CHF  . Stroke Sister   . Brain cancer Grandson        astrocytoma  . Breast cancer Cousin        dx 74s    Social History Social History   Tobacco Use  . Smoking status: Never Smoker  . Smokeless tobacco: Never Used  Vaping Use  . Vaping Use: Never used  Substance Use Topics  . Alcohol use: Yes    Comment: Occasionally drinks wine  . Drug use: No    Review of Systems  Review of Systems  Constitutional: Negative for fatigue and fever.  HENT: Negative for congestion and sore throat.   Eyes: Negative for visual disturbance.  Respiratory: Positive for chest  tightness and shortness of breath. Negative for cough.   Cardiovascular: Positive for chest pain.  Gastrointestinal: Negative for abdominal pain, diarrhea, nausea and vomiting.  Genitourinary: Negative for flank pain.  Musculoskeletal: Negative for back pain and neck pain.  Skin: Negative for rash and wound.  Neurological: Negative for weakness.  All other systems reviewed and are negative.    ____________________________________________  PHYSICAL EXAM:      VITAL SIGNS: ED Triage Vitals [01/04/21 0808]  Enc Vitals Group     BP (!) 197/88     Pulse Rate 72     Resp 20     Temp      Temp src      SpO2 100 %     Weight 175 lb 14.8 oz (79.8 kg)     Height 5\' 9"  (1.753 m)     Head Circumference      Peak Flow      Pain Score 7     Pain Loc      Pain Edu?      Excl. in GC?      Physical Exam Vitals and nursing note reviewed.  Constitutional:      General: She is not in acute distress.    Appearance: She is well-developed.  HENT:     Head: Normocephalic and atraumatic.  Eyes:     Conjunctiva/sclera: Conjunctivae normal.  Cardiovascular:     Rate and Rhythm: Normal rate and regular rhythm.     Heart sounds: Normal heart sounds. No murmur heard. No friction rub.  Pulmonary:     Effort: Pulmonary effort is normal. No respiratory distress.     Breath sounds: Normal breath sounds. No wheezing or rales.  Abdominal:     General: There is no distension.     Palpations: Abdomen is soft.     Tenderness: There is no abdominal tenderness.  Musculoskeletal:     Cervical back: Neck supple.  Skin:    General: Skin is warm.     Capillary Refill: Capillary refill takes less than 2 seconds.  Neurological:     Mental Status: She  is alert and oriented to person, place, and time.     Motor: No abnormal muscle tone.       ____________________________________________   LABS (all labs ordered are listed, but only abnormal results are displayed)  Labs Reviewed  TROPONIN I  (HIGH SENSITIVITY) - Abnormal; Notable for the following components:      Result Value   Troponin I (High Sensitivity) 39 (*)    All other components within normal limits  BASIC METABOLIC PANEL  CBC  TROPONIN I (HIGH SENSITIVITY)    ____________________________________________  EKG: Normal sinus rhythm, ventricular rate 64.  PR 150, QRS 88, QTc 412.  No acute ST elevations or depressions. ________________________________________  RADIOLOGY All imaging, including plain films, CT scans, and ultrasounds, independently reviewed by me, and interpretations confirmed via formal radiology reads.  ED MD interpretation:   Chest x-ray: No PE, incidental ascending thoracic aneurysm, small left effusion  Official radiology report(s): CT Angio Chest PE W and/or Wo Contrast  Result Date: 01/04/2021 CLINICAL DATA:  Shortness of breath and left-sided chest pain worsening over the last 4 days. Patient being treated for left-sided breast cancer. EXAM: CT ANGIOGRAPHY CHEST WITH CONTRAST TECHNIQUE: Multidetector CT imaging of the chest was performed using the standard protocol during bolus administration of intravenous contrast. Multiplanar CT image reconstructions and MIPs were obtained to evaluate the vascular anatomy. CONTRAST:  27mL OMNIPAQUE IOHEXOL 350 MG/ML SOLN COMPARISON:  None. FINDINGS: Cardiovascular: The ascending thoracic aorta measures up to 4.1 cm. Opacification of the ascending thoracic aorta is not optimal to evaluate for dissection but none is seen. There are relatively mild atherosclerotic changes in the thoracic aorta. The heart size is borderline but not grossly enlarged. No pulmonary emboli. Mediastinum/Nodes: There is a tiny left pleural effusion. No other effusions. The esophagus is normal. No adenopathy. Findings in the left breast are consistent with history. Lungs/Pleura: Central airways are normal. No pneumothorax. Mild atelectasis in the lingula and left base. No evidence of pneumonia  or aspiration. No suspicious nodules or masses. Upper Abdomen: Cholelithiasis fills the gallbladder. Cysts are seen in the left kidney. Musculoskeletal: No chest wall abnormality. No acute or significant osseous findings. Review of the MIP images confirms the above findings. IMPRESSION: 1. No pulmonary emboli identified. 2. The ascending thoracic aorta measures 4.1 cm in caliber. Recommend annual imaging followup by CTA or MRA. This recommendation follows 2010 ACCF/AHA/AATS/ACR/ASA/SCA/SCAI/SIR/STS/SVM Guidelines for the Diagnosis and Management of Patients with Thoracic Aortic Disease. Circulation. 2010; [id number]. Aortic aneurysm NOS (ICD10-I71.9) 3. Mild atherosclerotic changes in the thoracic aorta. 4. Small left effusion. Mild atelectasis in the lingula and left base. 5. Cholelithiasis fills the gallbladder. Aortic Atherosclerosis (ICD10-I70.0). Aortic aneurysm NOS (ICD10-I71.9). Electronically Signed   By: Dorise Bullion III M.D   On: 01/04/2021 10:23    ____________________________________________  PROCEDURES   Procedure(s) performed (including Critical Care):  Procedures  ____________________________________________  INITIAL IMPRESSION / MDM / [place] / ED COURSE  As part of my medical decision making, I reviewed the following data within the [place] notes reviewed and incorporated, Old chart reviewed, Notes from prior ED visits, and [place] Controlled Substance Database       *Beth Rodgers was evaluated in Emergency Department on 01/04/2021 for the symptoms described in the history of present illness. She was evaluated in the context of the global COVID-19 pandemic, which necessitated consideration that the patient might be at risk for infection with the SARS-CoV-2 virus that causes COVID-19. Institutional protocols  and algorithms that pertain to the evaluation of patients at risk for COVID-19 are in a state of rapid change based on information  released by regulatory bodies including the CDC and federal and state organizations. These policies and algorithms were followed during the patient's care in the ED.  Some ED evaluations and interventions may be delayed as a result of limited staffing during the pandemic.*     Medical Decision Making: Very pleasant 86 year old female here with left-sided pleuritic chest pain, worse with deep inspiration.  There is slight worsening with movement as well.  Clinically, I suspect this to be musculoskeletal chest pain versus costochondritis versus intercostal sprain.  The patient has frequent coughing due to chronic sinusitis and I suspect this triggered a musculoskeletal injury.  She otherwise appears remarkably well.  She has no leukocytosis, sputum production, hypoxia, or evidence to suggest pneumonia.  The pain is localized to the chest wall and reproducible, and is not concerning for dissection clinically.  She is not hypoxic or tachypneic, and CT angio was obtained given her medical history, reviewed and shows no evidence of PE.  Incidental note of thoracic aneurysm is made, can follow-up as an outpatient.  Do not feel this is causing her symptoms.  Otherwise, she does have a small effusion on the left which could be related to this inflammation and coughing, but no evidence of concomitant pneumonia and I feel the side effects of antibiotics outweigh the benefits given absence of any other infectious symptoms.  For now, will treat as likely musculoskeletal versus atypical chest pain.  Of note, interestingly, she did have an initial troponin of 39.  Unclear if this was air, however, as her repeat despite constant pain for several hours was 5.  She has no known coronary risk factors.  She was just seen by cardiology within the last year and had an unremarkable echocardiogram with no evidence of cardiovascular disease.  She is very low risk heart score.  I do not suspect she has ACS currently.  This was  discussed with her and she is in agreement, will follow-up as an outpatient.  ____________________________________________  FINAL CLINICAL IMPRESSION(S) / ED DIAGNOSES  Final diagnoses:  Atypical chest pain  Thoracic aortic aneurysm without rupture ([place])     MEDICATIONS GIVEN DURING THIS VISIT:  Medications  fentaNYL (SUBLIMAZE) injection 25 mcg (25 mcg Intravenous Not Given 01/04/21 1056)  iohexol (OMNIPAQUE) 350 MG/ML injection 75 mL (75 mLs Intravenous Contrast Given 01/04/21 0950)  ketorolac (TORADOL) 30 MG/ML injection 15 mg (15 mg Intravenous Given 01/04/21 1055)  acetaminophen (TYLENOL) tablet 1,000 mg (1,000 mg Oral Given 01/04/21 1055)     ED Discharge Orders         Ordered    naproxen (NAPROSYN) 375 MG tablet  2 times daily with meals        01/04/21 1220           Note:  This document was prepared using Dragon voice recognition software and may include unintentional dictation errors.   Duffy Bruce, MD 01/04/21 1225

## 2021-01-04 NOTE — ED Triage Notes (Signed)
Pt reports is actively being treated for breast cancer and has some pain in her chest and SOB. Pt reports pain to her lungs with deep breathing for past 4 days and got worse last night.

## 2021-01-04 NOTE — ED Notes (Signed)
Pt ambulatory to toilet at this time, tolerated well, NAD noted.

## 2021-01-04 NOTE — ED Notes (Signed)
Pt refused toradol and fentanyl as ordered for pain at this time. Pt states she reacts very quickly to pain medicine and only wants the tylenol for now.

## 2021-01-04 NOTE — Discharge Instructions (Addendum)
As we discussed, I suspect your symptoms could be related to inflammation of your chest wall related to coughing.  For now, we are going to start you on an anti-inflammatory.  This should help with some of the pain.  Your CT did show a small amount of fluid in the lung.  There was no evidence of pneumonia.  I would recommend following up with your primary doctor or oncologist in the next week for follow-up.  Your CT did show slight enlargement of the main vessel off your heart, your aorta. This can be followed up in a year with repeat imaging.

## 2021-01-04 NOTE — ED Triage Notes (Signed)
Pt in via EMS from home with c/o lung pain that has gotten worse. Pt reports today it woke her up from her sleep. Pt states she thinks it is a side effect from her breast ca medication, exemestane. Pt c/o SOB. 183/100, hx of HTN take meds at night, HR 76, #20g to right [facility]

## 2021-01-07 ENCOUNTER — Ambulatory Visit (INDEPENDENT_AMBULATORY_CARE_PROVIDER_SITE_OTHER): Payer: Medicare Other | Admitting: Psychology

## 2021-01-07 ENCOUNTER — Ambulatory Visit: Payer: Medicare Other | Admitting: Psychology

## 2021-01-07 DIAGNOSIS — F4323 Adjustment disorder with mixed anxiety and depressed mood: Secondary | ICD-10-CM

## 2021-01-07 DIAGNOSIS — C50512 Malignant neoplasm of lower-outer quadrant of left female breast: Secondary | ICD-10-CM | POA: Diagnosis not present

## 2021-01-10 ENCOUNTER — Ambulatory Visit (INDEPENDENT_AMBULATORY_CARE_PROVIDER_SITE_OTHER)
Admission: RE | Admit: 2021-01-10 | Discharge: 2021-01-10 | Disposition: A | Discharge status: Home / Self Care | Payer: Medicare Other | Source: Ambulatory Visit | Attending: Family Medicine | Admitting: Family Medicine

## 2021-01-10 ENCOUNTER — Other Ambulatory Visit: Payer: Self-pay

## 2021-01-10 ENCOUNTER — Encounter: Payer: Self-pay | Admitting: Family Medicine

## 2021-01-10 ENCOUNTER — Ambulatory Visit (INDEPENDENT_AMBULATORY_CARE_PROVIDER_SITE_OTHER): Payer: Medicare Other | Admitting: Family Medicine

## 2021-01-10 VITALS — BP 112/66 | HR 83 | Temp 97.8°F | Ht 69.0 in | Wt 175.4 lb

## 2021-01-10 DIAGNOSIS — C50912 Malignant neoplasm of unspecified site of left female breast: Secondary | ICD-10-CM | POA: Diagnosis not present

## 2021-01-10 DIAGNOSIS — C50812 Malignant neoplasm of overlapping sites of left female breast: Secondary | ICD-10-CM

## 2021-01-10 DIAGNOSIS — I77810 Thoracic aortic ectasia: Secondary | ICD-10-CM

## 2021-01-10 DIAGNOSIS — Z170 Estrogen receptor positive status [ER+]: Secondary | ICD-10-CM | POA: Diagnosis not present

## 2021-01-10 DIAGNOSIS — R079 Chest pain, unspecified: Secondary | ICD-10-CM | POA: Diagnosis not present

## 2021-01-10 NOTE — Progress Notes (Signed)
Patient ID: Beth Rodgers, female    DOB: 05/01/1935, 86 y.o.   MRN: [id number]  This visit was conducted in person.  BP 112/66 (BP Location: Left Arm, Patient Position: Sitting, Cuff Size: Normal)   Pulse 83   Temp 97.8 F (36.6 C) (Temporal)   Ht 5\' 9"  (1.753 m)   Wt 175 lb 6 oz (79.5 kg)   SpO2 95%   BMI 25.90 kg/m    CC: ER f/u visit  Subjective:   HPI: Beth Rodgers is a 86 y.o. female presenting on 01/10/2021 for Hospitalization Follow-up (Seen on 01/04/21 at [facility] ED, dx atypical chest pain. )   Sent to ER 01/04/2021 for L pleuritic chest pain, records reviewed. Workup included chest CTA showing 4.1cm dilated ascending thoracic aorta- rec rpt imaging yearly. Thought costochondritis vs intercostal sprain due to increased coughing recently. Treated with toradol 15mg  and tylenol. Discharged on naprosyn with benefit.   Saw surgeon in f/u earlier this week - planned to continue anti estrogen therapy (exemestane). She did have partial mastectomy and sentinel lymph node biopsy on 09/01/2020 - with multiple positive margins to DCIS. Recommendation has been total mastectomy - she remains undecided.   She feels exemestane is causing dizzy side effect so takes at night with benefit, also noticing R>L wrist pain - well managed with naprosyn 375mg  bid provided by EDP but notes side effect of naprosyn is constipation.   Notes chronic issue with sputum production for years. Recent imaging showed mild left pleural effusion. Non smoker. Denies significant second hand smoke exposure.      Relevant past medical, surgical, family and social history reviewed and updated as indicated. Interim medical history since our last visit reviewed. Allergies and medications reviewed and updated. Outpatient Medications Prior to Visit  Medication Sig Dispense Refill  . alendronate (FOSAMAX) 70 MG tablet     . Ascorbic Acid (VITAMIN C CR PO) Take 500 mg by mouth daily.     . calcium carbonate (TUMS EX) 750 MG  chewable tablet Chew 1 tablet by mouth as needed for heartburn.     . desonide (DESOWEN) 0.05 % cream Apply 1 application topically 3 (three) times a week.    . diclofenac sodium (VOLTAREN) 1 % GEL Apply 1 application topically 3 (three) times daily. 1 Tube 0  . exemestane (AROMASIN) 25 MG tablet     . halobetasol (ULTRAVATE) 0.05 % ointment Apply 1 application topically 4 (four) times a week.  1  . levothyroxine (SYNTHROID) 75 MCG tablet Take 1 tablet (75 mcg total) by mouth daily. (Patient taking differently: Take 75 mcg by mouth daily before breakfast.) 90 tablet 3  . losartan (COZAAR) 100 MG tablet Take 1 tablet (100 mg total) by mouth daily. (Patient taking differently: Take 100 mg by mouth at bedtime.) 90 tablet 3  . Multiple Vitamin (MULTIVITAMIN) tablet Take 1 tablet by mouth daily.     . Multiple Vitamins-Minerals (ONE DAILY CALCIUM/IRON) TABS Take 1 tablet by mouth daily.    . naproxen (NAPROSYN) 375 MG tablet     . NITROSTAT 0.4 MG SL tablet Place 0.4 mg under the tongue every 5 (five) minutes as needed for chest pain.     . silver sulfADIAZINE (SILVADENE) 1 % cream Apply 1 application topically daily. 50 g 2  . Travoprost, BAK Free, (TRAVATAN) 0.004 % SOLN ophthalmic solution Place 1 drop into both eyes at bedtime.    . Cholecalciferol (VITAMIN D) 50 MCG (2000 UT) CAPS Take  1 capsule (2,000 Units total) by mouth daily. (Patient not taking: Reported on 01/10/2021) 30 capsule   . Crisaborole (EUCRISA) 2 % OINT Apply 1 application topically daily as needed (Sore on legs).     . fluticasone (CUTIVATE) 0.05 % cream Apply topically daily. For max 2 wks at a time (Patient taking differently: Apply 1 application topically daily as needed (toe fungus). For max 2 wks at a time) 30 g 0   No facility-administered medications prior to visit.     Per HPI unless specifically indicated in ROS section below Review of Systems Objective:  BP 112/66 (BP Location: Left Arm, Patient Position: Sitting,  Cuff Size: Normal)   Pulse 83   Temp 97.8 F (36.6 C) (Temporal)   Ht 5\' 9"  (1.753 m)   Wt 175 lb 6 oz (79.5 kg)   SpO2 95%   BMI 25.90 kg/m   Wt Readings from Last 3 Encounters:  01/10/21 175 lb 6 oz (79.5 kg)  01/04/21 175 lb 14.8 oz (79.8 kg)  12/03/20 176 lb (79.8 kg)      Physical Exam Vitals and nursing note reviewed.  Constitutional:      Appearance: Normal appearance. She is not ill-appearing.  Cardiovascular:     Rate and Rhythm: Normal rate and regular rhythm.     Pulses: Normal pulses.     Heart sounds: Normal heart sounds. No murmur heard.   Pulmonary:     Effort: Pulmonary effort is normal. No respiratory distress.     Breath sounds: Normal breath sounds. No wheezing, rhonchi or rales.  Chest:     Chest wall: Tenderness (L sided lower ribcage discomfort) present.  Musculoskeletal:     Right lower leg: No edema.     Left lower leg: No edema.  Skin:    General: Skin is warm and dry.     Findings: No rash.  Neurological:     Mental Status: She is alert.  Psychiatric:        Mood and Affect: Mood normal.        Behavior: Behavior normal.       Results for orders placed or performed during the hospital encounter of [id number]  Basic metabolic panel  Result Value Ref Range   Sodium 140 135 - 145 mmol/L   Potassium 3.8 3.5 - 5.1 mmol/L   Chloride 104 98 - 111 mmol/L   CO2 27 22 - 32 mmol/L   Glucose, Bld 98 70 - 99 mg/dL   BUN 19 8 - 23 mg/dL   Creatinine, Ser 0.67 0.44 - 1.00 mg/dL   Calcium 8.9 8.9 - 10.3 mg/dL   GFR, Estimated >60 >60 mL/min   Anion gap 9 5 - 15  CBC  Result Value Ref Range   WBC 7.7 4.0 - 10.5 K/uL   RBC 4.73 3.87 - 5.11 MIL/uL   Hemoglobin 13.4 12.0 - 15.0 g/dL   HCT 41.2 36.0 - 46.0 %   MCV 87.1 80.0 - 100.0 fL   MCH 28.3 26.0 - 34.0 pg   MCHC 32.5 30.0 - 36.0 g/dL   RDW 14.0 11.5 - 15.5 %   Platelets 226 150 - 400 K/uL   nRBC 0.0 0.0 - 0.2 %  Troponin I (High Sensitivity)  Result Value Ref Range   Troponin I (High  Sensitivity) 39 (H) <18 ng/L  Troponin I (High Sensitivity)  Result Value Ref Range   Troponin I (High Sensitivity) 5 <18 ng/L   Assessment & Plan:  This  visit occurred during the SARS-CoV-2 public health emergency.  Safety protocols were in place, including screening questions prior to the visit, additional usage of staff PPE, and extensive cleaning of exam room while observing appropriate contact time as indicated for disinfecting solutions.   Problem List Items Addressed This Visit    Malignant neoplasm of overlapping sites of left breast in female, estrogen receptor positive ([place])    Continues f/u with onc and gen surg.  Remains hesitant to proceed with recommended mastectomy due to age, instead opts for cancer surveillance.  Continues aromatase inhibitor Exemestane.       Left-sided chest pain - Primary    Intermittent L sided chest discomfort s/p reassuring ER evaluation.  Thought MSK treated with naprosyn 375mg  with benefit. Discussed continuing 220mg  aleve OTC PRN once completes 375mg  course.  In breast cancer hx, update L rib films r/o bone etiology.       Relevant Orders   DG Ribs Unilateral Left (Completed)   Ascending aorta dilatation (HCC)    Newly noted on recent CTA. Recommended yearly imaging.       Relevant Orders   DG Ribs Unilateral Left (Completed)       No orders of the defined types were placed in this encounter.  Orders Placed This Encounter  Procedures  . DG Ribs Unilateral Left    Standing Status:   Future    Number of Occurrences:   1    Standing Expiration Date:   01/10/2022    Order Specific Question:   Reason for Exam (SYMPTOM  OR DIAGNOSIS REQUIRED)    Answer:   L rib pain    Order Specific Question:   Preferred imaging location?    Answer:   [facility]    Patient Instructions  Despues de terminar naprosyn de receta, puede comenzar aleve (naprosyn 220mg  sin receta).  Placa hoy de riones de lado izquierdo.  Gusto verla hoy.   Regresar para fisico en junio, antes si necesita.   Follow up plan: Return if symptoms worsen or fail to improve.  Ria Bush, MD

## 2021-01-10 NOTE — Patient Instructions (Addendum)
Despues de terminar naprosyn de receta, puede comenzar aleve (naprosyn 220mg  sin receta).  Placa hoy de riones de lado izquierdo.  Gusto verla hoy.  Regresar para fisico en junio, antes si necesita.

## 2021-01-11 NOTE — Assessment & Plan Note (Signed)
Newly noted on recent CTA. Recommended yearly imaging.

## 2021-01-11 NOTE — Assessment & Plan Note (Signed)
Intermittent L sided chest discomfort s/p reassuring ER evaluation.  Thought MSK treated with naprosyn 375mg  with benefit. Discussed continuing 220mg  aleve OTC PRN once completes 375mg  course.  In breast cancer hx, update L rib films r/o bone etiology.

## 2021-01-11 NOTE — Assessment & Plan Note (Signed)
Continues f/u with onc and gen surg.  Remains hesitant to proceed with recommended mastectomy due to age, instead opts for cancer surveillance.  Continues aromatase inhibitor Exemestane.

## 2021-01-14 DIAGNOSIS — Z170 Estrogen receptor positive status [ER+]: Secondary | ICD-10-CM | POA: Diagnosis not present

## 2021-01-14 DIAGNOSIS — C50812 Malignant neoplasm of overlapping sites of left female breast: Secondary | ICD-10-CM | POA: Diagnosis not present

## 2021-01-20 ENCOUNTER — Telehealth: Payer: Self-pay | Admitting: Family Medicine

## 2021-01-20 MED ORDER — NAPROXEN 375 MG PO TABS
375.0000 mg | ORAL_TABLET | Freq: Two times a day (BID) | ORAL | 0 refills | Status: DC | PRN
Start: 2021-01-20 — End: 2021-06-02

## 2021-01-20 NOTE — Telephone Encounter (Signed)
[facility] Day - Client TELEPHONE ADVICE RECORD [facility]urse Patient Name: Beth Rodgers Gender: Female DOB: 19-Jun-1935 Age: 86 Y 67 M 25 D Return Phone Number: [phone] (Primary) Address: City/State/Zip: [place] [address] Client [facility] Day - Client Client Site [place] Physician Ria Bush - MD Contact Type Call Who Is Calling Patient / Member / Family / Caregiver Call Type Triage / Clinical Relationship To Patient Self Return Phone Number [phone] (Primary) Chief Complaint Arm Pain (no known cause) Reason for Call Symptomatic / Request for Berger, from the office, states pt has forearm to wrist pain. She was seen last week for this but is wanting to know what to do. Translation No Nurse Assessment Nurse: Wynetta Emery, RN, Baker Janus Date/Time Eilene Ghazi Time): 01/20/2021 10:49:03 AM Confirm and document reason for call. If symptomatic, describe symptoms. ---Beth Rodgers is still having pain in right arm from elbow to hand was in forearm and now from lower wrist to hand more acute naproxen given. swelling present. Does the patient have any new or worsening symptoms? ---Yes Will a triage be completed? ---Yes Related visit to physician within the last 2 weeks? ---Yes Does the PT have any chronic conditions? (i.e. diabetes, asthma, this includes High risk factors for pregnancy, etc.) ---Yes List chronic conditions. ---Cancer Thyroid disorder Glaucoma Hyperlipidemia Is this a behavioral health or substance abuse call? ---No Guidelines Guideline Title Affirmed Question Affirmed Notes Nurse Date/Time (Eastern Time) Arm Pain [1] Arm pains with exertion (e.g., walking) AND [2] pain goes away on resting AND [3] not present now Ivin Booty 01/20/2021 10:53:07 AM Disp. Time Eilene Ghazi Time) Disposition Final User 01/20/2021 10:57:52 AM See HCP within 4 Hours (or  PCP triage) Yes Wynetta Emery, RN, Baker Janus PLEASE NOTE: All timestamps contained within this report are represented as [place] Standard Time. CONFIDENTIALTY NOTICE: This fax transmission is intended only for the addressee. It contains information that is legally privileged, confidential or otherwise protected from use or disclosure. If you are not the intended recipient, you are strictly prohibited from reviewing, disclosing, copying using or disseminating any of this information or taking any action in reliance on or regarding this information. If you have received this fax in error, please notify us immediately by telephone so that we can arrange for its return to [place]. Phone: [phone], Toll-Free: [phone], Fax: [phone] Page: 2 of 2 Call Id: [id number] [place] Disagree/Comply Comply Caller Understands Yes PreDisposition Call Doctor Care Advice Given Per Guideline SEE HCP (OR PCP TRIAGE) WITHIN 4 HOURS: * Arm pain or chest pain lasts over 5 minutes * Difficulty breathing or unusual sweating * You become worse CARE ADVICE given per Arm Pain (Adult) guideline. Comments User: Michele Rockers, RN Date/Time Eilene Ghazi Time): 01/20/2021 11:04:22 AM RN NOTE; see a md in 4 hours; office has no available appts. asked if they can call in naproxen for her states that worked with the pain before it radiated to wrist and hand with slight swelling. Referrals REFERRED TO PCP OFFICE Warm transfer to backline

## 2021-01-20 NOTE — Telephone Encounter (Signed)
Patient called Access Nurse and call was transferred to this RN. Patient stated that she has been having arm pain and swelling that has moved to her wrist and hand since she started the medication Exemestane. Patient contacted her oncologist and they instructed her to stop the medication and made an appointment for her to follow-up with them on 2/8. Patient stated that since stopping the medication four days ago, the pain has resumed and it is worse this morning. Patient reported that along with the pain, it feels like her skin is burning and sensitive. Patient stated that she has been taking Tylenol, but feels that the Naproxen prescribed by Dr. Danise Mina worked and was wondering if she can have another prescription refill or if Dr. Danise Mina recommends anything else? Please advise.

## 2021-01-20 NOTE — Telephone Encounter (Signed)
Pt called in wanted to know if she can get another prescription due to she is still having a pain from her forearm to her wrist and she was taking Naproxen and she is out.  Please advise

## 2021-01-20 NOTE — Telephone Encounter (Signed)
plz notify this was refilled.  Let us know if pain worsens or new rash develops.

## 2021-01-20 NOTE — Telephone Encounter (Signed)
Called pt to advised that medication was sent in. -JMA

## 2021-01-21 ENCOUNTER — Ambulatory Visit (INDEPENDENT_AMBULATORY_CARE_PROVIDER_SITE_OTHER): Payer: Medicare Other | Admitting: Psychology

## 2021-01-21 DIAGNOSIS — F4323 Adjustment disorder with mixed anxiety and depressed mood: Secondary | ICD-10-CM

## 2021-01-28 ENCOUNTER — Telehealth: Payer: Self-pay | Admitting: Family Medicine

## 2021-01-28 ENCOUNTER — Other Ambulatory Visit: Payer: Self-pay | Admitting: Family Medicine

## 2021-01-28 MED ORDER — DESONIDE 0.05 % EX CREA
1.0000 | TOPICAL_CREAM | Freq: Two times a day (BID) | CUTANEOUS | 0 refills | Status: DC
Start: 2021-01-28 — End: 2021-01-30

## 2021-01-28 NOTE — Telephone Encounter (Signed)
Sent in

## 2021-01-28 NOTE — Telephone Encounter (Signed)
Pt called in wanted to know about getting a certain medication refill but due to it maybe expired wanted to know if it can be refilled - desonide

## 2021-01-28 NOTE — Telephone Encounter (Signed)
Patient states that she would like refill on the desonide.  Last prescribed back in 05/2020.  Medication has been used on her ears and has lasted a long time.   Please advise.  Thanks. Dm/cma

## 2021-01-29 NOTE — Telephone Encounter (Signed)
Notified pt by Joseph Art understanding and expresses her thanks.

## 2021-01-30 NOTE — Telephone Encounter (Signed)
done

## 2021-01-30 NOTE — Telephone Encounter (Signed)
Message from pharmacy states Desowen cream not covered.  Suggests one of following alternatives:   Betamethasone valerate (Valisone) 0.1% cream Mometasone (Elocon) 0.1% cream Triamcinolone cream (Kenalog) 0.5%

## 2021-02-04 ENCOUNTER — Ambulatory Visit: Payer: Medicare Other | Admitting: Psychology

## 2021-02-04 DIAGNOSIS — Z170 Estrogen receptor positive status [ER+]: Secondary | ICD-10-CM | POA: Diagnosis not present

## 2021-02-04 DIAGNOSIS — M2550 Pain in unspecified joint: Secondary | ICD-10-CM | POA: Diagnosis not present

## 2021-02-04 DIAGNOSIS — Z08 Encounter for follow-up examination after completed treatment for malignant neoplasm: Secondary | ICD-10-CM | POA: Diagnosis not present

## 2021-02-04 DIAGNOSIS — Z853 Personal history of malignant neoplasm of breast: Secondary | ICD-10-CM | POA: Diagnosis not present

## 2021-02-04 DIAGNOSIS — C50812 Malignant neoplasm of overlapping sites of left female breast: Secondary | ICD-10-CM | POA: Diagnosis not present

## 2021-02-04 DIAGNOSIS — R238 Other skin changes: Secondary | ICD-10-CM | POA: Diagnosis not present

## 2021-02-04 DIAGNOSIS — C50912 Malignant neoplasm of unspecified site of left female breast: Secondary | ICD-10-CM | POA: Diagnosis not present

## 2021-02-04 DIAGNOSIS — R5383 Other fatigue: Secondary | ICD-10-CM | POA: Diagnosis not present

## 2021-02-05 ENCOUNTER — Ambulatory Visit (INDEPENDENT_AMBULATORY_CARE_PROVIDER_SITE_OTHER): Payer: Medicare Other | Admitting: Psychology

## 2021-02-05 DIAGNOSIS — F4323 Adjustment disorder with mixed anxiety and depressed mood: Secondary | ICD-10-CM

## 2021-02-06 DIAGNOSIS — H401111 Primary open-angle glaucoma, right eye, mild stage: Secondary | ICD-10-CM | POA: Diagnosis not present

## 2021-02-12 DIAGNOSIS — C50512 Malignant neoplasm of lower-outer quadrant of left female breast: Secondary | ICD-10-CM | POA: Diagnosis not present

## 2021-02-18 ENCOUNTER — Ambulatory Visit (INDEPENDENT_AMBULATORY_CARE_PROVIDER_SITE_OTHER): Payer: Medicare Other | Admitting: Psychology

## 2021-02-18 DIAGNOSIS — F4323 Adjustment disorder with mixed anxiety and depressed mood: Secondary | ICD-10-CM | POA: Diagnosis not present

## 2021-02-19 ENCOUNTER — Telehealth: Payer: Self-pay

## 2021-02-19 NOTE — Telephone Encounter (Signed)
Pt calling with a dull pain at base of head in the middle and rt side of head that originates between the rt shoulder and rt side of neck and radiates to the base of the skull with pain level of 5 now and usually hurts worse at night time for 3 days. Pt said if applies pressure to the area it causes more pain while pressing on skin. Recently oncologist stopped the exemestane due to weakness and pain in  Rt wrist area that can be a side effect of the exemestane. The weakness in rt wrist goes up rt arm to elbow.pt said the skin on the rt arm is also sensitive today. Pt has been reading and the exemestane can have another side effect being blood clots. Pt does not have any weakness or pain in lower extremities. Pt also noted that she has pimple like places (pt is not sure if any liquid in the pimples or not) that go from the right waist area up to the right shoulder. Pt said the pimples do not hurt and she is using an eczema cream on them. Pt has never had shingles. Pt has been taking tylenol 2 tabs two - three times a day.pt said her main concern is the pain from the shoulder to the neck and wondering if that could be related to side effects of Exemestane and pt is also concerned about possibility of blood clots. No available appts this wk at [facility] and pt's husband is going to take pt to [facility] in today. Sending note to DR Baldwin Crown CMA.

## 2021-02-20 NOTE — Telephone Encounter (Signed)
I don't see where she was evaluated. plz call for update on multiple issues, ensure evaluated. Would offer OV if ongoing concern.

## 2021-02-20 NOTE — Telephone Encounter (Addendum)
Thank you. Would offer 12:30pm appt earlier in the week if pt desires to be seen sooner.

## 2021-02-20 NOTE — Telephone Encounter (Signed)
Spoke with pt asking if she was seen at [facility].  States she was not.  C/o still having pain pain in R wrist for 1 mo.  Also, c/o itchy rash along waist.  States the triamcinolone cream seems to be helping dry it up.  Says head and R shoulder pain has stopped.    Offered OV.  Pt scheduled on 02/28/21 at 2:30 for rash and right wrist pain.  Pt states she ok to wait a week since sxs have been going on for a month or so.  FYI to Dr. Darnell Level.

## 2021-02-21 NOTE — Telephone Encounter (Signed)
Spoke to patient by telephone and she is scheduled to see Dr. Danise Mina Monday 02/24/21 at 4:00 pm. Patient will discuss all of her concerns at that visit.

## 2021-02-21 NOTE — Telephone Encounter (Signed)
Patient called in and stated she would like to discuss a few things with Dr.G's CMA in regards to possibly starting B-12 injections. Patient stated she heard it will boost immune system but would like to discuss with nurse further. Please advise if available.

## 2021-02-22 DIAGNOSIS — L309 Dermatitis, unspecified: Secondary | ICD-10-CM | POA: Diagnosis not present

## 2021-02-22 DIAGNOSIS — M25531 Pain in right wrist: Secondary | ICD-10-CM | POA: Diagnosis not present

## 2021-02-24 ENCOUNTER — Ambulatory Visit: Payer: Medicare Other | Admitting: Family Medicine

## 2021-02-26 ENCOUNTER — Telehealth: Payer: Self-pay | Admitting: Family Medicine

## 2021-02-26 NOTE — Telephone Encounter (Signed)
Has OV on 03/04/21.

## 2021-02-26 NOTE — Telephone Encounter (Signed)
Patient states that the pain is so bad in her wrist. Patient is wanting advice as to what she needs to do. Does she need to talk to orthopedics about it? EM

## 2021-02-26 NOTE — Telephone Encounter (Addendum)
Does she have leftover naprosyn 375mg ? If so could try this or I could refill if needed.  Would also have her take tylenol 500mg  TID.  Would offer appt Friday 4pm if desired.

## 2021-02-26 NOTE — Telephone Encounter (Signed)
Spoke with pt asking if she has any naprosyn 375 mg.  Pt says she does but it causes a lot of constipation.  I relayed Dr. Synthia Innocent message about Tylenol and an OV on Friday at 4:00.  Pt verbalizes understanding and agrees to OV on Friday.  I will send Barbee Shropshire a message asking her to r/s pt's appt.

## 2021-02-28 ENCOUNTER — Other Ambulatory Visit: Payer: Self-pay

## 2021-02-28 ENCOUNTER — Ambulatory Visit (INDEPENDENT_AMBULATORY_CARE_PROVIDER_SITE_OTHER): Payer: Medicare Other | Admitting: Family Medicine

## 2021-02-28 ENCOUNTER — Encounter: Payer: Self-pay | Admitting: Family Medicine

## 2021-02-28 ENCOUNTER — Ambulatory Visit: Payer: Medicare Other | Admitting: Family Medicine

## 2021-02-28 ENCOUNTER — Ambulatory Visit (INDEPENDENT_AMBULATORY_CARE_PROVIDER_SITE_OTHER)
Admission: RE | Admit: 2021-02-28 | Discharge: 2021-02-28 | Disposition: A | Discharge status: Home / Self Care | Payer: Medicare Other | Source: Ambulatory Visit | Attending: Family Medicine | Admitting: Family Medicine

## 2021-02-28 VITALS — BP 134/82 | HR 72 | Temp 97.7°F | Ht 69.0 in | Wt 176.5 lb

## 2021-02-28 DIAGNOSIS — R21 Rash and other nonspecific skin eruption: Secondary | ICD-10-CM | POA: Insufficient documentation

## 2021-02-28 DIAGNOSIS — M25531 Pain in right wrist: Secondary | ICD-10-CM

## 2021-02-28 DIAGNOSIS — M19031 Primary osteoarthritis, right wrist: Secondary | ICD-10-CM | POA: Diagnosis not present

## 2021-02-28 NOTE — Assessment & Plan Note (Signed)
L upper arm rash consistent with eczematous dermatitis flare, papular back rash could possibly be drug rash to naprosyn - now off this. She is finishing medrol dosepack. Discussed topical steroid use.

## 2021-02-28 NOTE — Assessment & Plan Note (Addendum)
Given duration of pain, check xray today.  No mechanism of injury to suggest fracture  Anticipate extensor carpi ulnaris tendonitis, less likely TFCC injury (as again no mechanism of injury) - rec wrist brace and voltaren gel, provided with exercises on wrist tendonitis. If not improving with this will recommend SM eval.

## 2021-02-28 NOTE — Progress Notes (Signed)
Patient ID: Beth Rodgers, female    DOB: 06/22/1935, 86 y.o.   MRN: [id number]  This visit was conducted in person.  BP 134/82   Pulse 72   Temp 97.7 F (36.5 C) (Temporal)   Ht 5\' 9"  (1.753 m)   Wt 176 lb 8 oz (80.1 kg)   SpO2 98%   BMI 26.06 kg/m    CC: R wrist pain  Subjective:   HPI: Beth Rodgers is a 86 y.o. female presenting on 02/28/2021 for Wrist Pain (C/o right wrist pain.  Pt accompanied by husband, Cy- temp 98.1./.)   Ongoing R wrist pain for >1 month initially attributed to exemastane (aromasin) but pain has persisted despite stopping this medication. Treating at home with tylenol 500mg  TID with meals. She's also been using husband's old CTS wrist brace. Denies inciting trauma/injury. Known osteopenia. Describes bone pain localized to ulnar medial wrist as well as sensitivity to skin. Pain is the lowest in the mornings, then progressively worsens the more she works with her hands. No redness, warmth, and mild swelling. R handed. At its worse 5.5/10, in the mornings 3/10. Has gotten some better but pain persists. Pain described as sharp stabbing pain, no electrical shock sensation, numbness or paresthesias.   Recently seen at [facility] (last week) with rash to neck thought eczematous dermatitis treated with oral steroid course (medrol dosepack) with benefit - finished yesterday. Rash is improving. She wonders if previous course of naprosyn caused this rash. Naprosyn also caused constipation.      Relevant past medical, surgical, family and social history reviewed and updated as indicated. Interim medical history since our last visit reviewed. Allergies and medications reviewed and updated. Outpatient Medications Prior to Visit  Medication Sig Dispense Refill  . alendronate (FOSAMAX) 70 MG tablet     . Ascorbic Acid (VITAMIN C CR PO) Take 500 mg by mouth daily.     . betamethasone valerate (VALISONE) 0.1 % cream Apply topically daily. Limit to 1 wk use in a row 15 g 0  .  calcium carbonate (TUMS EX) 750 MG chewable tablet Chew 1 tablet by mouth as needed for heartburn.     . Cholecalciferol (VITAMIN D) 50 MCG (2000 UT) CAPS Take 1 capsule (2,000 Units total) by mouth daily. 30 capsule   . diclofenac sodium (VOLTAREN) 1 % GEL Apply 1 application topically 3 (three) times daily. 1 Tube 0  . halobetasol (ULTRAVATE) 0.05 % ointment Apply 1 application topically 4 (four) times a week.  1  . levothyroxine (SYNTHROID) 75 MCG tablet Take 1 tablet (75 mcg total) by mouth daily. (Patient taking differently: Take 75 mcg by mouth daily before breakfast.) 90 tablet 3  . losartan (COZAAR) 100 MG tablet Take 1 tablet (100 mg total) by mouth daily. (Patient taking differently: Take 100 mg by mouth at bedtime.) 90 tablet 3  . Multiple Vitamin (MULTIVITAMIN) tablet Take 1 tablet by mouth daily.     . Multiple Vitamins-Minerals (ONE DAILY CALCIUM/IRON) TABS Take 1 tablet by mouth daily.    Marland Kitchen NITROSTAT 0.4 MG SL tablet Place 0.4 mg under the tongue every 5 (five) minutes as needed for chest pain.     . silver sulfADIAZINE (SILVADENE) 1 % cream Apply 1 application topically daily. 50 g 2  . Travoprost, BAK Free, (TRAVATAN) 0.004 % SOLN ophthalmic solution Place 1 drop into both eyes at bedtime.    . naproxen (NAPROSYN) 375 MG tablet Take 1 tablet (375 mg total) by mouth  2 (two) times daily as needed. Take with food (Patient not taking: Reported on 02/28/2021) 30 tablet 0  . exemestane (AROMASIN) 25 MG tablet      No facility-administered medications prior to visit.     Per HPI unless specifically indicated in ROS section below Review of Systems Objective:  BP 134/82   Pulse 72   Temp 97.7 F (36.5 C) (Temporal)   Ht 5\' 9"  (1.753 m)   Wt 176 lb 8 oz (80.1 kg)   SpO2 98%   BMI 26.06 kg/m   Wt Readings from Last 3 Encounters:  02/28/21 176 lb 8 oz (80.1 kg)  01/10/21 175 lb 6 oz (79.5 kg)  01/04/21 175 lb 14.8 oz (79.8 kg)      Physical Exam Vitals and nursing note  reviewed.  Constitutional:      Appearance: Normal appearance. She is not ill-appearing.  Musculoskeletal:        General: Tenderness (marked) present. No swelling.     Comments:  L wrist WNL R wrist:  No pain at anatomic snuff box, no pain at 1st CMC, no significant pain at radial, dorsal or ventral wrists. Marked point tenderness throughout ulnar wrist even to touch of skin, worst pain with wrist flexion  Positive ECU synergy test  Skin:    General: Skin is warm and dry.     Findings: Erythema and rash present.     Comments:  Erythematous pruritic papules dispersed on back  Dry itchy scaly red patches to L lateral elbow into upper arm   Neurological:     Mental Status: She is alert.  Psychiatric:        Mood and Affect: Mood normal.        Behavior: Behavior normal.       Assessment & Plan:  This visit occurred during the SARS-CoV-2 public health emergency.  Safety protocols were in place, including screening questions prior to the visit, additional usage of staff PPE, and extensive cleaning of exam room while observing appropriate contact time as indicated for disinfecting solutions.   Problem List Items Addressed This Visit    Skin rash    L upper arm rash consistent with eczematous dermatitis flare, papular back rash could possibly be drug rash to naprosyn - now off this. She is finishing medrol dosepack. Discussed topical steroid use.       Right wrist pain - Primary    Given duration of pain, check xray today.  No mechanism of injury to suggest fracture  Anticipate extensor carpi ulnaris tendonitis, less likely TFCC injury (as again no mechanism of injury) - rec wrist brace and voltaren gel, provided with exercises on wrist tendonitis. If not improving with this will recommend SM eval.       Relevant Orders   DG Wrist Complete Right       No orders of the defined types were placed in this encounter.  Orders Placed This Encounter  Procedures  . DG Wrist Complete  Right    Standing Status:   Future    Number of Occurrences:   1    Standing Expiration Date:   02/28/2022    Order Specific Question:   Reason for Exam (SYMPTOM  OR DIAGNOSIS REQUIRED)    Answer:   1+ month history of R wrist pain without injury, prevoiusly attributed to exemestane    Order Specific Question:   Preferred imaging location?    Answer:   [place]    Patient Instructions  Possible ligament sprain or tendonitis of wrist.  Xray today  Use wrist brace regularly especially at night time.  Use voltaren topical anti inflammatory to wrist.  Let us know if not improving with this.    Follow up plan: Return if symptoms worsen or fail to improve.  Ria Bush, MD

## 2021-02-28 NOTE — Patient Instructions (Addendum)
Possible ligament sprain or tendonitis of wrist.  Xray today  Use wrist brace regularly especially at night time.  Use voltaren topical anti inflammatory to wrist.  Let us know if not improving with this.

## 2021-03-03 ENCOUNTER — Encounter: Payer: Self-pay | Admitting: Family Medicine

## 2021-03-04 ENCOUNTER — Ambulatory Visit: Payer: Medicare Other | Admitting: Psychology

## 2021-03-04 ENCOUNTER — Ambulatory Visit: Payer: Medicare Other | Admitting: Family Medicine

## 2021-03-05 ENCOUNTER — Telehealth: Payer: Self-pay | Admitting: Family Medicine

## 2021-03-05 ENCOUNTER — Ambulatory Visit: Payer: Medicare Other | Admitting: Psychology

## 2021-03-05 DIAGNOSIS — Z85828 Personal history of other malignant neoplasm of skin: Secondary | ICD-10-CM

## 2021-03-05 DIAGNOSIS — L309 Dermatitis, unspecified: Secondary | ICD-10-CM

## 2021-03-05 DIAGNOSIS — R21 Rash and other nonspecific skin eruption: Secondary | ICD-10-CM

## 2021-03-05 NOTE — Telephone Encounter (Signed)
referral placed

## 2021-03-05 NOTE — Telephone Encounter (Addendum)
Patient left a voicemail requesting a referral to a Dermatologist in [place] that Dr Danise Mina recommends.   Patient wants to switch from [place] to [place] for convenience purposes.  Requests a call back once referral is placed.   Please advise.

## 2021-03-06 NOTE — Telephone Encounter (Signed)
Noted  

## 2021-03-07 ENCOUNTER — Ambulatory Visit (INDEPENDENT_AMBULATORY_CARE_PROVIDER_SITE_OTHER): Payer: Medicare Other | Admitting: Psychology

## 2021-03-07 DIAGNOSIS — F4323 Adjustment disorder with mixed anxiety and depressed mood: Secondary | ICD-10-CM

## 2021-03-10 ENCOUNTER — Encounter: Payer: Self-pay | Admitting: Dermatology

## 2021-03-10 ENCOUNTER — Telehealth: Payer: Self-pay

## 2021-03-10 ENCOUNTER — Ambulatory Visit (INDEPENDENT_AMBULATORY_CARE_PROVIDER_SITE_OTHER): Payer: Medicare Other | Admitting: Dermatology

## 2021-03-10 ENCOUNTER — Ambulatory Visit: Payer: Medicare Other | Admitting: Dermatology

## 2021-03-10 ENCOUNTER — Other Ambulatory Visit: Payer: Self-pay

## 2021-03-10 DIAGNOSIS — L409 Psoriasis, unspecified: Secondary | ICD-10-CM

## 2021-03-10 DIAGNOSIS — I8311 Varicose veins of right lower extremity with inflammation: Secondary | ICD-10-CM

## 2021-03-10 DIAGNOSIS — Z853 Personal history of malignant neoplasm of breast: Secondary | ICD-10-CM

## 2021-03-10 DIAGNOSIS — Z85828 Personal history of other malignant neoplasm of skin: Secondary | ICD-10-CM

## 2021-03-10 DIAGNOSIS — I8312 Varicose veins of left lower extremity with inflammation: Secondary | ICD-10-CM | POA: Diagnosis not present

## 2021-03-10 MED ORDER — OTEZLA 30 MG PO TABS: 30.0000 mg | ORAL_TABLET | Freq: Two times a day (BID) | ORAL | 12 refills | Status: DC

## 2021-03-10 MED ORDER — OTEZLA 30 MG PO TABS
30.0000 mg | ORAL_TABLET | Freq: Two times a day (BID) | ORAL | 6 refills | Status: DC
Start: 2021-03-10 — End: 2021-04-22

## 2021-03-10 MED ORDER — CLOBETASOL PROPIONATE 0.05 % EX CREA: 1.0000 "application " | TOPICAL_CREAM | CUTANEOUS | 2 refills | Status: DC

## 2021-03-10 MED ORDER — FLUOCINOLONE ACETONIDE BODY 0.01 % EX OIL: 1.0000 [drp] | TOPICAL_OIL | Freq: Every day | CUTANEOUS | 2 refills | Status: DC

## 2021-03-10 MED ORDER — CALCIPOTRIENE 0.005 % EX CREA: TOPICAL_CREAM | CUTANEOUS | 3 refills | Status: DC

## 2021-03-10 NOTE — Telephone Encounter (Signed)
Spoke with pt about Calcipotriene not being covered by insurance. Pt already picked up other prescriptions at pharmacy so I offered her the cash pay option at [facility] - $45. She is checking with her husband and will call back tomorrow.

## 2021-03-10 NOTE — Telephone Encounter (Signed)
We sent in clobetasol cream today, so change that to Diprolene cream qd/bid to aas body which is another superpotent topical steroid (avoid f/g/a), and then in two weeks we can send in the Vit D cream.  Please explain the change to pt.

## 2021-03-10 NOTE — Patient Instructions (Signed)
[facility] for compression garments for legs

## 2021-03-10 NOTE — Telephone Encounter (Signed)
Calcipotriene cream denied by insurance. Pt must try and fail betamethasone dipropionate.

## 2021-03-10 NOTE — Progress Notes (Addendum)
New Patient Visit  Subjective  Beth Rodgers is a 86 y.o. female who presents for the following: Rash (Back, elbows,~83m hx of worsening, hx of psoriasis, took naproxen and rash got worse, uses TMC 0.1% cr prn , Fluticasone cr prn, and used Halog prn). H/o BCC.  Recently treated for Breast cancer on oral medications now.  New patient referral from Dr. Ria Bush.  The following portions of the chart were reviewed this encounter and updated as appropriate:       Review of Systems:  No other skin or systemic complaints except as noted in HPI or Assessment and Plan.  Objective  Well appearing patient in no apparent distress; mood and affect are within normal limits.  A focused examination was performed including arms, back, legs, scalp, ears. Relevant physical exam findings are noted in the Assessment and Plan.  Objective  bil lower legs: Violaceous woody induration  bil lower legs with hourglass deformity  Objective  back, chest, arms, legs, ears: Well-demarcated pink scaly patches shoulders, back, pink scaly paps chest, similar L arm, L elbow, post auricular erythema and scale, similar in ears, pink scaly paps thighs BSA 8%   Assessment & Plan    History of Breast Cancer - lumpectomy 08/2020  History of Basal Cell Carcinoma of the Skin - No evidence of recurrence today - Recommend regular full body skin exams - Recommend daily broad spectrum sunscreen SPF 30+ to sun-exposed areas, reapply every 2 hours as needed.  - Call if any new or changing lesions are noted between office visits  Lipodermatosclerosis of both lower extremities bil lower legs  Secondary to chronic stasis dermatitis Recommend compression hose qd  Stasis in the legs causes chronic leg swelling, which may result in itchy or painful rashes, skin discoloration, skin texture changes, and sometimes ulceration.  Recommend daily compression hose/stockings- easiest to put on first thing in morning, remove  at bedtime.  Elevate legs as much as possible. Avoid salt/sodium rich foods.   Psoriasis back, chest, arms, legs, ears  With flare, possibly related to NSAID use (naproxen) Psoriasis is a chronic non-curable, but treatable genetic/hereditary disease that may have other systemic features affecting other organ systems such as joints (Psoriatic Arthritis). It is associated with an increased risk of inflammatory bowel disease, heart disease, non-alcoholic fatty liver disease, and depression.    Discussed Otezla and side effects. Prefer Otezla over biologic at this point since recent h/o breast cancer.  Rutherford Nail is not an immunosupressive. Side effects of Otezla (apremilast) include diarrhea, nausea, headache, upper respiratory infection, depression, and weight decrease (5-10%). It should only be taken by pregnant women after a discussion regarding risks and benefits with their doctor. Goal is control of skin condition, not cure.  The use of R[place] Nail requires long term medication management, including periodic office visits.    Start Otezla 30mg  1 po bid, sample given, discussed titrating to 30mg  qd, if pt doing good on 30mg  qd will go up to bid Lot# [id number] 11/26/2022 Start Clobetasol cr qd/bid up to 5d/wk aa psoriasis on body, avoid f/g/a Start Calcipotriene cr qd aa psoriasis on face/ears/body prn flares Start Derma-Smooth FS oil 1gtt to ear qd/bid prn flares, may apply to body after bath  Topical steroids (such as triamcinolone, fluocinolone, fluocinonide, mometasone, clobetasol, halobetasol, betamethasone, hydrocortisone) can cause thinning and lightening of the skin if they are used for too long in the same area. Your physician has selected the right strength medicine for your problem and area affected  on the body. Please use your medication only as directed by your physician to prevent side effects.    Apremilast (OTEZLA) 30 MG TABS - back, chest, arms, legs, ears  Apremilast (OTEZLA) 30 MG TABS  - back, chest, arms, legs, ears  clobetasol cream (TEMOVATE) 0.05 % - back, chest, arms, legs, ears  calcipotriene (DOVONOX) 0.005 % cream - back, chest, arms, legs, ears  Fluocinolone Acetonide Body 0.01 % OIL - back, chest, arms, legs, ears  Return in about 1 month (around 04/10/2021) for psoriasis.  I, Othelia Pulling, RMA, am acting as scribe for Beth Patty, MD .  Documentation: I have reviewed the above documentation for accuracy and completeness, and I agree with the above.  Beth Patty MD

## 2021-03-11 ENCOUNTER — Other Ambulatory Visit: Payer: Self-pay | Admitting: General Surgery

## 2021-03-11 DIAGNOSIS — C50512 Malignant neoplasm of lower-outer quadrant of left female breast: Secondary | ICD-10-CM

## 2021-03-18 ENCOUNTER — Ambulatory Visit (INDEPENDENT_AMBULATORY_CARE_PROVIDER_SITE_OTHER): Payer: Medicare Other | Admitting: Psychology

## 2021-03-18 DIAGNOSIS — F4323 Adjustment disorder with mixed anxiety and depressed mood: Secondary | ICD-10-CM | POA: Diagnosis not present

## 2021-03-19 ENCOUNTER — Telehealth: Payer: Self-pay

## 2021-03-19 NOTE — Telephone Encounter (Signed)
Patient advised of information per Dr. Nicole Kindred.

## 2021-03-19 NOTE — Telephone Encounter (Signed)
The Rutherford Nail does not affect her immune system and does not put her at any higher risk for cancer.  She can continue the 30 mg daily and not increase to twice daily if it is working for her at the lower dose. Cutting the tab in half may affect absorption or GI side effects, so I would recommend she try to take the full 30 mg tab which is already a low dose.

## 2021-03-19 NOTE — Telephone Encounter (Signed)
Patient called regarding Rutherford Nail. Patient states she has done wonderful with the loading dose and already working on improving her psoriasis. Patient states she is getting ready to have a MRI done to prepare her for possible mastectomy. She is concerned about going higher in dosing due to her immune system already having issues from cancer treatments.  Patient would like to know can she stay on 15mg  by cutting the 30mg  in half once daily? I know Rutherford Nail rep has told us in the past that you will find its not recommended cutting the pill but people tolerate it just fine.

## 2021-03-26 ENCOUNTER — Telehealth: Payer: Self-pay

## 2021-03-26 ENCOUNTER — Other Ambulatory Visit: Payer: Self-pay

## 2021-03-26 MED ORDER — TACROLIMUS 0.1 % EX OINT: TOPICAL_OINTMENT | Freq: Two times a day (BID) | CUTANEOUS | 0 refills | Status: DC

## 2021-03-26 NOTE — Telephone Encounter (Signed)
Patient called regarding a new flare on legs. She states about 12 days in on taking the [place] and her statis dermatitis is flared on her legs. She would like to know do you think these two are connected? She does not want the swelling to continue and cause open sores or wounds.

## 2021-03-26 NOTE — Progress Notes (Signed)
Patient called [facility] and Tacrolimus will be $400 for patient.  30 gram tube sent to [facility] with [facility] coupon for $37.

## 2021-03-26 NOTE — Telephone Encounter (Signed)
I don't think it is related to the [place].  The Otezla doesn't cause leg swelling.  She needs to treat the stasis dermatitis with decreasing the excess fluid in her legs with compression, leg elevation, and/or diuretic medication.  And treat the rash with topical steroid cream.  We can send in [facility] 0.1% cream bid to lower legs 80 gm, 1 rf

## 2021-03-26 NOTE — Telephone Encounter (Signed)
Called patient to advise her of information per Dr. Nicole Kindred. She states she wants to stop the [place]. She can not take the diarrhea, nausea and being dizzy. She feels like her days are gone after laying in bed from feeling bad after taking medication.   She also wears compression stockings daily with no relief. She does not want topical steroid due to thinning of the skin.

## 2021-03-26 NOTE — Telephone Encounter (Signed)
Patient advised of information per Dr. Nicole Kindred. Tacrolimus sent in for patient to use.

## 2021-03-26 NOTE — Telephone Encounter (Signed)
Okay she can stop it.  We can discuss other options for psoriasis like biologics if she wants to try something else.  We can send in tacrolimus ointment for her legs which is not a steroid if she hasn't tried that yet.

## 2021-03-31 ENCOUNTER — Other Ambulatory Visit: Payer: Self-pay

## 2021-03-31 ENCOUNTER — Ambulatory Visit
Admission: RE | Admit: 2021-03-31 | Discharge: 2021-03-31 | Disposition: A | Discharge status: Home / Self Care | Payer: Medicare Other | Source: Ambulatory Visit | Attending: General Surgery | Admitting: General Surgery

## 2021-03-31 DIAGNOSIS — C50512 Malignant neoplasm of lower-outer quadrant of left female breast: Secondary | ICD-10-CM | POA: Diagnosis not present

## 2021-03-31 DIAGNOSIS — N6489 Other specified disorders of breast: Secondary | ICD-10-CM | POA: Diagnosis not present

## 2021-03-31 MED ORDER — GADOBUTROL 1 MMOL/ML IV SOLN
10.0000 mL | Freq: Once | INTRAVENOUS | Status: AC | PRN
  Administered 2021-03-31: 7.5 mL via INTRAVENOUS

## 2021-04-01 ENCOUNTER — Ambulatory Visit (INDEPENDENT_AMBULATORY_CARE_PROVIDER_SITE_OTHER): Payer: Medicare Other | Admitting: Psychology

## 2021-04-01 DIAGNOSIS — F4323 Adjustment disorder with mixed anxiety and depressed mood: Secondary | ICD-10-CM | POA: Diagnosis not present

## 2021-04-05 DIAGNOSIS — Z20822 Contact with and (suspected) exposure to covid-19: Secondary | ICD-10-CM | POA: Diagnosis not present

## 2021-04-05 DIAGNOSIS — R059 Cough, unspecified: Secondary | ICD-10-CM | POA: Diagnosis not present

## 2021-04-08 DIAGNOSIS — C50512 Malignant neoplasm of lower-outer quadrant of left female breast: Secondary | ICD-10-CM | POA: Diagnosis not present

## 2021-04-14 ENCOUNTER — Telehealth: Payer: Self-pay | Admitting: Family Medicine

## 2021-04-14 NOTE — Telephone Encounter (Signed)
Please call patient and advise her that we cannot release records from another office. She will need to contact the office where she had the sonogram done and request them through that office.

## 2021-04-14 NOTE — Telephone Encounter (Signed)
Patient states that she will stop by and get a medical records release form. The patient states that the office that did the sonogram for her would need to get her pcp to request the medical records for her. EM

## 2021-04-14 NOTE — Telephone Encounter (Signed)
Patient is requesting a copy of her medical records from her results of the  sonogram from last year from her breast.  EM

## 2021-04-15 ENCOUNTER — Ambulatory Visit (INDEPENDENT_AMBULATORY_CARE_PROVIDER_SITE_OTHER): Payer: Medicare Other | Admitting: Psychology

## 2021-04-15 DIAGNOSIS — F4323 Adjustment disorder with mixed anxiety and depressed mood: Secondary | ICD-10-CM | POA: Diagnosis not present

## 2021-04-22 ENCOUNTER — Other Ambulatory Visit: Payer: Self-pay

## 2021-04-22 ENCOUNTER — Ambulatory Visit (INDEPENDENT_AMBULATORY_CARE_PROVIDER_SITE_OTHER): Payer: Medicare Other | Admitting: Dermatology

## 2021-04-22 DIAGNOSIS — L409 Psoriasis, unspecified: Secondary | ICD-10-CM

## 2021-04-22 DIAGNOSIS — I8393 Asymptomatic varicose veins of bilateral lower extremities: Secondary | ICD-10-CM | POA: Diagnosis not present

## 2021-04-22 DIAGNOSIS — B353 Tinea pedis: Secondary | ICD-10-CM

## 2021-04-22 DIAGNOSIS — D1801 Hemangioma of skin and subcutaneous tissue: Secondary | ICD-10-CM

## 2021-04-22 DIAGNOSIS — I872 Venous insufficiency (chronic) (peripheral): Secondary | ICD-10-CM

## 2021-04-22 NOTE — Progress Notes (Signed)
   Follow-Up Visit   Subjective  Beth Rodgers is a 86 y.o. female who presents for the following: Psoriasis (1 month f/u on Psoriasis on the body, treating with Tacrolimus cream, clobetasol cream with a fair response) and Dermatitis (Check lower legs, stasis dermatitis treating with Silvadene cream with good response ).  Has new spot on R chest she wants checked.  Stopped Otezla due to side effects.  Doesn't like feel of Dermasmoothe oil, so stopped that as well.   The following portions of the chart were reviewed this encounter and updated as appropriate:       Review of Systems:  No other skin or systemic complaints except as noted in HPI or Assessment and Plan.  Objective  Well appearing patient in no apparent distress; mood and affect are within normal limits.  A focused examination was performed including face, arms, legs. Relevant physical exam findings are noted in the Assessment and Plan.  Objective  arms, elbows, back: Pink scaly patches, papules  Objective  bilateral feet: Mild scale   Objective  bilateral ankles: hyperpigmented, indurated patches, some scale involving the ankle and distal lower leg with associated lower leg woody edema.    Assessment & Plan  Psoriasis arms, elbows, back  Improving, but not clear.  Pt only interested in topical treatment at this time. Psoriasis is a chronic non-curable, but treatable genetic/hereditary disease that may have other systemic features affecting other organ systems such as joints (Psoriatic Arthritis). It is associated with an increased risk of inflammatory bowel disease, heart disease, non-alcoholic fatty liver disease, and depression.     D/C Otezla tablets due to side effects D/C Dermasmooth fs oil, pt didn't like  Cont Clobetasol cream apply to severe areas of Psoriasis on the body prn  Cont Tacrolimus ointment apply to less severe areas of Psoriasis on the body daily  Cont Triamcinolone cream apply to less severe  areas of Psoriasis on the body prn  Pt can try judicious natural light/sun exposure 15-20 min 3x/week.   Other Related Medications clobetasol cream (TEMOVATE) 0.05 %  Tinea pedis of both feet bilateral feet  Tinea pedis/toenail dystrophy   Start otc Amlactin lotion  Cont Ciclopirox cream to feet daily   Stasis dermatitis of both legs bilateral ankles  With Lipodermatosclerosis- Chronic condition, controlled on current therapy  Cont Silvadene cream apply to aas feet/ankles daily  Cont Tacrolimus ointment qd-bid   Cont Support stocking daily  Stasis in the legs causes chronic leg swelling, which may result in itchy or painful rashes, skin discoloration, skin texture changes, and sometimes ulceration.  Recommend daily compression hose/stockings- easiest to put on first thing in morning, remove at bedtime.  Elevate legs as much as possible. Avoid salt/sodium rich foods.     Hemangiomas Right chest  - Red papule - Discussed benign nature - Observe - Call for any changes  Return in about 4 months (around 08/22/2021) for Psoriasis, stasis derm .  I, Marye Round, CMA, am acting as scribe for [name], MD .  Documentation: I have reviewed the above documentation for accuracy and completeness, and I agree with the above.  [name] MD

## 2021-04-22 NOTE — Patient Instructions (Addendum)
Gentle Skin Care Guide  1. Bathe no more than once a day.  2. Avoid bathing in hot water  3. Use a mild soap like Dove, Vanicream, Cetaphil, CeraVe. Can use Lever 2000 or Cetaphil antibacterial soap  4. Use soap only where you need it. On most days, use it under your arms, between your legs, and on your feet. Let the water rinse other areas unless visibly dirty.  5. When you get out of the bath/shower, use a towel to gently blot your skin dry, don't rub it.  6. While your skin is still a little damp, apply a moisturizing cream such as Vanicream, CeraVe, Cetaphil, Eucerin, Sarna lotion or plain Vaseline Jelly. For hands apply Neutrogena Norwegian Hand Cream or Excipial Hand Cream.  7. Reapply moisturizer any time you start to itch or feel dry.  8. Sometimes using free and clear laundry detergents can be helpful. Fabric softener sheets should be avoided. Downy Free & Gentle liquid, or any liquid fabric softener that is free of dyes and perfumes, it acceptable to use  9. If your doctor has given you prescription creams you may apply moisturizers over them   Topical steroids (such as triamcinolone, fluocinolone, fluocinonide, mometasone, clobetasol, halobetasol, betamethasone, hydrocortisone) can cause thinning and lightening of the skin if they are used for too long in the same area. Your physician has selected the right strength medicine for your problem and area affected on the body. Please use your medication only as directed by your physician to prevent side effects.   

## 2021-04-29 ENCOUNTER — Ambulatory Visit: Payer: Medicare Other | Admitting: Psychology

## 2021-05-01 DIAGNOSIS — C50912 Malignant neoplasm of unspecified site of left female breast: Secondary | ICD-10-CM | POA: Diagnosis not present

## 2021-05-05 ENCOUNTER — Telehealth: Payer: Self-pay

## 2021-05-05 NOTE — Telephone Encounter (Signed)
Patient called and wanted to speak with Dr Darnell Level in regards to recent visits with oncologist  the plan of care and get recommendations from Dr Darnell Level on certain concerns. No openings this week with Dr Darnell Level and patient states time is of the essence with what is going on. Patient would like to see if Dr Darnell Level can call her and discuss for a few minutes. Or if patient can be worked in this week possibly? Not sure of her availability due to other appointments now. Please call patient back. Thank you

## 2021-05-05 NOTE — Telephone Encounter (Signed)
I spoke with patient. Trouble tolerating anti estrogen medications.  MRI last month showing changes suspicious for residual cancer - recommendation has been L total mastectomy.  She is going to see Dr Freddi Che at [facility].

## 2021-05-13 ENCOUNTER — Ambulatory Visit: Payer: Medicare Other | Admitting: Psychology

## 2021-05-14 ENCOUNTER — Ambulatory Visit (INDEPENDENT_AMBULATORY_CARE_PROVIDER_SITE_OTHER): Payer: Medicare Other | Admitting: Psychology

## 2021-05-14 DIAGNOSIS — F4323 Adjustment disorder with mixed anxiety and depressed mood: Secondary | ICD-10-CM | POA: Diagnosis not present

## 2021-05-19 ENCOUNTER — Telehealth: Payer: Self-pay | Admitting: Family Medicine

## 2021-05-19 NOTE — Telephone Encounter (Signed)
Mrs. Beth Rodgers called in wanted to know about why she has to speak with the medicare nurse on the phone and not in person. I told her its due to the covid restrictions

## 2021-05-20 NOTE — Telephone Encounter (Signed)
Noted  

## 2021-05-21 DIAGNOSIS — C50812 Malignant neoplasm of overlapping sites of left female breast: Secondary | ICD-10-CM | POA: Diagnosis not present

## 2021-05-21 DIAGNOSIS — Z170 Estrogen receptor positive status [ER+]: Secondary | ICD-10-CM | POA: Diagnosis not present

## 2021-05-23 ENCOUNTER — Ambulatory Visit: Payer: Self-pay | Admitting: General Surgery

## 2021-05-23 ENCOUNTER — Other Ambulatory Visit: Payer: Self-pay | Admitting: General Surgery

## 2021-05-23 DIAGNOSIS — C50512 Malignant neoplasm of lower-outer quadrant of left female breast: Secondary | ICD-10-CM

## 2021-05-23 NOTE — H&P (Signed)
HISTORY OF PRESENT ILLNESS:    Beth Rodgers is a 86 y.o.female patient who comes for surgical discussion of left breast extensive DCIS.  Patient with 2 areas of invasive lobular carcinoma s/p partial mastectomy.  During the partial mastectomy he had excision of the 2 invasive lobular areas and low suspicious areas in the supra areolar area.  The anterior and posterior margins of the partial mastectomy were positive for DCIS.  The suspicious area that was excisionally biopsied showed DCIS with positive margins.  The patient initially refused to have total mastectomy and wanted to proceed with medical management with antiestrogen therapy.  She went to [facility] for a second opinion.  At [facility] oncology she was started on antiestrogen therapy and it was changed to multiple alternatives due to side effects.  Last time they evaluated the patient she did not tolerated any of the antiestrogen alternatives and patient is not on any antiestrogen therapy at this moment.  Patient had a follow-up MRI that shows enhancement of the surgical border suspicious of residual disease.  Patient denies any pain in her breast.  There is no pain radiation.  There is no alleviating or aggravating factors.  I personally evaluated the images of the MRI.  During the last evaluation the patient still wanted to deal with some personal and family issues before proceeding with mastectomy.  The patient has finally decided that she needed to proceed with total mastectomy.  Patient denies any breast pain.  She denies any pain radiation.  She denies any alleviating or aggravating factors.      PAST MEDICAL HISTORY:  Past Medical History:  Diagnosis Date  . Arthritis    osteoarthritis  . Basal cell carcinoma   . Glaucoma (increased eye pressure)   . Thyroid disease    hypothyroid  . Venous disease 06/11/2012   From Clinic Note5/07/2012 She underwent noninvasive venous testing revealing her to have anterior accessory and small saphenous reflux  bilaterally.  Her great saphenous veins are missing bilaterally due to surgical stripping, and she has reflux in her deep system as well.  Overall, given her pigmentation and changes consistent of longstanding chronic venous insufficiency, I would like to see if she        PAST SURGICAL HISTORY:   Past Surgical History:  Procedure Laterality Date  . BREAST EXCISIONAL BIOPSY    . EYE SURGERY Bilateral    cataract  . Nasal reconstruction for basal cell carcinoma    . Vein Stripping & Ligation           MEDICATIONS:  Outpatient Encounter Medications as of 05/23/2021  Medication Sig Dispense Refill  . alendronate (FOSAMAX) 70 MG tablet Take by mouth once a week    . ascorbic acid, vitamin C, 500 mg TbER Take 1 tablet by mouth    . betamethasone dipropionate 0.05 % lotion betamethasone dipropionate 0.05 % lotion    . betamethasone dipropionate, augmented, (DIPROLENE-AF) 0.05 % cream betamethasone, augmented 0.05 % topical cream    . calcium carbonate (TUMS E-X) 300 mg (750 mg) chewable tablet Take by mouth as needed    . calcium carbonate 600 mg calcium (1,500 mg) Tab tablet Take 1 tablet by mouth    . cholecalciferol (VITAMIN D3) 1,250 mcg (50,000 unit) capsule Take 50,000 Units by mouth every 7 (seven) days    . cholecalciferol (VITAMIN D3) 2,000 unit capsule Take 1 capsule by mouth once daily as needed    . ciclopirox (LOPROX) 0.77 % cream APPLY 1 APPLICATION  ON THE SKIN OF FEET TWICE A DAY    . clobetasoL (CORMAX) 0.05 % external solution APPLY 1 ML TO THE SCALP TWICE A DAY    . crisaborole (EUCRISA) 2 % Oint Apply topically as needed For legs    . exemestane (AROMASIN) 25 mg tablet     . fluocinolone (DERMA-SMOOTHE) 0.01 % external oil Apply topically    . fluticasone propionate (CUTIVATE) 0.05 % cream Apply topically    . halobetasol (ULTRAVATE) 0.05 % ointment halobetasol propionate 0.05 % topical ointment    . levothyroxine (SYNTHROID) 75 MCG tablet Take 1 tablet by mouth once daily     . losartan (COZAAR) 100 MG tablet Take 1 tablet by mouth once daily    . multivitamin tablet Take by mouth.    . nitroGLYcerin (NITROSTAT) 0.4 MG SL tablet Reported on 12/17/2015    . silver sulfADIAZINE (SSD) 1 % cream Apply topically as needed    . tacrolimus (PROTOPIC) 0.1 % ointment Apply topically 2 (two) times daily    . tamoxifen (NOLVADEX) 20 MG tablet     . travoprost (TRAVATAN Z) 0.004 % Ophth ophthalmic solution Apply 1 drop to eye nightly    . celecoxib (CELEBREX) 200 MG capsule 1 capsule as needed (Patient not taking: Reported on 05/23/2021)    . lidocaine-prilocaine (EMLA) cream Apply topically once for 1 dose Apply around the nipple areola area one hour before the procedure. 5 g 0   No facility-administered encounter medications on file as of 05/23/2021.     ALLERGIES:   Naproxen   SOCIAL HISTORY:  Social History   Socioeconomic History  . Marital status: Married  Tobacco Use  . Smoking status: Never Smoker  . Smokeless tobacco: Never Used  Vaping Use  . Vaping Use: Never used    FAMILY HISTORY:  No family history on file.   GENERAL REVIEW OF SYSTEMS:   General ROS: negative for - chills, fatigue, fever, weight gain or weight loss Allergy and Immunology ROS: negative for - hives  Hematological and Lymphatic ROS: negative for - bleeding problems or bruising, negative for palpable nodes Endocrine ROS: negative for - heat or cold intolerance, hair changes Respiratory ROS: negative for - cough, shortness of breath or wheezing Cardiovascular ROS: no chest pain or palpitations GI ROS: negative for nausea, vomiting, abdominal pain, diarrhea, constipation Musculoskeletal ROS: negative for - muscle pain.  Positive for knee pain Neurological ROS: negative for - confusion, syncope Dermatological ROS: negative for pruritus and rash  PHYSICAL EXAM:  Vitals:   05/23/21 0909  BP: 129/80  Pulse: 88  .  Ht:167.6 cm (5\' 6" ) Wt:78.9 kg (174 lb) PXT:GGYI surface area is  1.92 meters squared. Body mass index is 28.08 kg/m.Marland Kitchen   GENERAL: Alert, active, oriented x3  HEENT: Pupils equal reactive to light. Extraocular movements are intact. Sclera clear. Palpebral conjunctiva normal red color.Pharynx clear.  NECK: Supple with no palpable mass and no adenopathy.  LUNGS: Sound clear with no rales rhonchi or wheezes.  HEART: Regular rhythm S1 and S2 without murmur.  BREAST: Both breast were examined in the sitting and supine position there was no palpable masses, no skin changes, no nipple retraction, no nipple discharge.  The breasts are well-healed.  No axillary adenopathy bilaterally.  ABDOMEN: Soft and depressible, nontender with no palpable mass, no hepatomegaly.   EXTREMITIES: Well-developed well-nourished symmetrical with no dependent edema.  NEUROLOGICAL: Awake alert oriented, facial expression symmetrical, moving all extremities.      IMPRESSION:  Malignant neoplasm of lower-outer quadrant of left female breast, unspecified estrogen receptor status (CMS-HCC) [C50.512] -S/p partial mastectomy and sentinel biopsyon 09/11/2020 -Multiple positive margins to DCIS. No posterior margin to invasive carcinoma -Postoperative seroma resolved -Patient has been on 3 type of antiestrogen therapy.   She has not tolerated any type of antiestrogen therapy.  Patient is currently off antiestrogen therapy. -I had a long discussion with the patient that since she did not tolerated antiestrogen therapy my recommendation is to proceed with total mastectomy.  She now agrees to proceed with left total mastectomy.  I have discussed with the patient the surgical procedure and risk of surgery includes bleeding, infection, seroma, pain, swelling of the upper extremity.  Risk of anesthesia that includes cardia complications, pneumonia, DVT, stroke, among others. The patient reports that she understood and agreed to proceed.      PLAN:  1. Left total mastectomy and sentinel  lymph node biopsy (42103, [id number]) 2. CBC, CMP 3. Avoid taking aspirin 5 days before surgery 4. Contact us if you have any concern.   Patient verbalized understanding, all questions were answered, and were agreeable with the plan outlined above.   [name], MD  Electronically signed by [name], MD

## 2021-05-27 ENCOUNTER — Ambulatory Visit (INDEPENDENT_AMBULATORY_CARE_PROVIDER_SITE_OTHER): Payer: Medicare Other | Admitting: Psychology

## 2021-05-27 ENCOUNTER — Other Ambulatory Visit: Payer: Self-pay | Admitting: General Surgery

## 2021-05-27 DIAGNOSIS — F4323 Adjustment disorder with mixed anxiety and depressed mood: Secondary | ICD-10-CM

## 2021-05-27 DIAGNOSIS — C50512 Malignant neoplasm of lower-outer quadrant of left female breast: Secondary | ICD-10-CM

## 2021-05-28 ENCOUNTER — Telehealth: Payer: Self-pay | Admitting: Family Medicine

## 2021-05-28 NOTE — Telephone Encounter (Signed)
Beth Rodgers called in and wanted to ask Dr. Darnell Level if its okay to get an EKG before getting her mastectomy, which is 6/29 and wanted to know if its needed due to she had her heart checked last year.

## 2021-05-29 DIAGNOSIS — M7662 Achilles tendinitis, left leg: Secondary | ICD-10-CM | POA: Diagnosis not present

## 2021-05-30 ENCOUNTER — Telehealth: Payer: Self-pay

## 2021-05-30 NOTE — Telephone Encounter (Signed)
Patient called and stated that she has been experiencing neck pain for the past three months. Patient stated that it is mostly in the center of her neck that radiates to the R side and down into her shoulder blades. Patient describes pain as a tightness. Patient denied pain radiating into arms or legs and patient is still able to move extremities as normal. Patient has appointment scheduled with Dr. Danise Mina on Monday (6/6). UC and ED precautions given to patient who verbalized understanding.

## 2021-05-30 NOTE — Telephone Encounter (Addendum)
Do recommend preop eval - may do this during Monday 6/6 appointment. Will need EKG at that time.

## 2021-05-30 NOTE — Telephone Encounter (Signed)
Patient calling to get Dr Synthia Innocent opinion in regards to who she should see for orthopedic specialist in [facility] for left knee pain.  Patient states she saw orthopedic provider about 20 to 30 years ago originally for this and was told she did not need replacement surgery yet but to do home exercises and she has been doing it. But lately the knee has been more painful, wakes her up at night, affects her ambulation. No injury or trauma, patient states she is getting older and so is her knee. She has heel spur and walks different from that and thinks that also has affected her knee. She wants to get this checked out by an orthopedic specialist again since it has been a while. She would like to go to someone in [place].   Do you need to see patient first or can we go ahead and refer? Any suggestion on who to see? Thank you

## 2021-05-30 NOTE — Telephone Encounter (Signed)
Haven't seen her for knee pain in a long time.  Would need OV first. Would offer seeing our sports medicine Dr Lorelei Pont for knee pain.

## 2021-05-30 NOTE — Telephone Encounter (Signed)
Noted  

## 2021-05-31 ENCOUNTER — Other Ambulatory Visit: Payer: Self-pay | Admitting: Family Medicine

## 2021-05-31 DIAGNOSIS — I10 Essential (primary) hypertension: Secondary | ICD-10-CM

## 2021-05-31 DIAGNOSIS — C50812 Malignant neoplasm of overlapping sites of left female breast: Secondary | ICD-10-CM

## 2021-05-31 DIAGNOSIS — Z01818 Encounter for other preprocedural examination: Secondary | ICD-10-CM

## 2021-05-31 DIAGNOSIS — E559 Vitamin D deficiency, unspecified: Secondary | ICD-10-CM

## 2021-05-31 DIAGNOSIS — E039 Hypothyroidism, unspecified: Secondary | ICD-10-CM

## 2021-06-02 ENCOUNTER — Other Ambulatory Visit: Payer: Medicare Other

## 2021-06-02 ENCOUNTER — Ambulatory Visit (INDEPENDENT_AMBULATORY_CARE_PROVIDER_SITE_OTHER)
Admission: RE | Admit: 2021-06-02 | Discharge: 2021-06-02 | Disposition: A | Discharge status: Home / Self Care | Payer: Medicare Other | Source: Ambulatory Visit | Attending: Family Medicine | Admitting: Family Medicine

## 2021-06-02 ENCOUNTER — Other Ambulatory Visit: Payer: Self-pay

## 2021-06-02 ENCOUNTER — Encounter: Payer: Self-pay | Admitting: Family Medicine

## 2021-06-02 ENCOUNTER — Ambulatory Visit (INDEPENDENT_AMBULATORY_CARE_PROVIDER_SITE_OTHER): Payer: Medicare Other | Admitting: Family Medicine

## 2021-06-02 ENCOUNTER — Ambulatory Visit: Payer: Medicare Other

## 2021-06-02 VITALS — BP 134/82 | HR 71 | Temp 97.9°F | Ht 69.0 in | Wt 172.6 lb

## 2021-06-02 DIAGNOSIS — I10 Essential (primary) hypertension: Secondary | ICD-10-CM

## 2021-06-02 DIAGNOSIS — Z01818 Encounter for other preprocedural examination: Secondary | ICD-10-CM

## 2021-06-02 DIAGNOSIS — G8929 Other chronic pain: Secondary | ICD-10-CM

## 2021-06-02 DIAGNOSIS — E039 Hypothyroidism, unspecified: Secondary | ICD-10-CM

## 2021-06-02 DIAGNOSIS — M25562 Pain in left knee: Secondary | ICD-10-CM

## 2021-06-02 DIAGNOSIS — M542 Cervicalgia: Secondary | ICD-10-CM

## 2021-06-02 DIAGNOSIS — M159 Polyosteoarthritis, unspecified: Secondary | ICD-10-CM

## 2021-06-02 DIAGNOSIS — C50812 Malignant neoplasm of overlapping sites of left female breast: Secondary | ICD-10-CM | POA: Diagnosis not present

## 2021-06-02 DIAGNOSIS — M8949 Other hypertrophic osteoarthropathy, multiple sites: Secondary | ICD-10-CM

## 2021-06-02 DIAGNOSIS — Z170 Estrogen receptor positive status [ER+]: Secondary | ICD-10-CM

## 2021-06-02 DIAGNOSIS — M150 Primary generalized (osteo)arthritis: Secondary | ICD-10-CM

## 2021-06-02 LAB — CBC WITH DIFFERENTIAL/PLATELET
Basophils Absolute: 0 10*3/uL (ref 0.0–0.1)
Basophils Relative: 0.3 % (ref 0.0–3.0)
Eosinophils Absolute: 0.1 10*3/uL (ref 0.0–0.7)
Eosinophils Relative: 1.5 % (ref 0.0–5.0)
HCT: 40.9 % (ref 36.0–46.0)
Hemoglobin: 13.9 g/dL (ref 12.0–15.0)
Lymphocytes Relative: 25.2 % (ref 12.0–46.0)
Lymphs Abs: 1.6 10*3/uL (ref 0.7–4.0)
MCHC: 34.1 g/dL (ref 30.0–36.0)
MCV: 85.7 fl (ref 78.0–100.0)
Monocytes Absolute: 0.7 10*3/uL (ref 0.1–1.0)
Monocytes Relative: 11 % (ref 3.0–12.0)
Neutro Abs: 3.9 10*3/uL (ref 1.4–7.7)
Neutrophils Relative %: 62 % (ref 43.0–77.0)
Platelets: 230 10*3/uL (ref 150.0–400.0)
RBC: 4.77 Mil/uL (ref 3.87–5.11)
RDW: 13.9 % (ref 11.5–15.5)
WBC: 6.3 10*3/uL (ref 4.0–10.5)

## 2021-06-02 LAB — TSH: TSH: 2.44 u[IU]/mL (ref 0.35–4.50)

## 2021-06-02 NOTE — Patient Instructions (Addendum)
Labs today Chest xray and EKG today  We will forward results attn Dr Windell Moment.  I hope you have a speedy recovery!  Para cuello - creo que tiene musculos tiesos afectando dolor de cuello. Siga heating pad, comienze tylenol 500mg  1-2 de noche.  Para rodilla - rayos x de rodilla izquierda hoy. Haga cita con Dr Lorelei Pont para considerar inyeccion de corticosteroide a la rodilla.

## 2021-06-02 NOTE — Assessment & Plan Note (Signed)
RCRI = 0 Anticipate adequately low risk to proceed with upcoming planned total mastectomy.  Will forward labs/results attn Dr Windell Moment.

## 2021-06-02 NOTE — Telephone Encounter (Signed)
Pt seen in office today.

## 2021-06-02 NOTE — Progress Notes (Signed)
Patient ID: Beth Rodgers, female    DOB: 06/10/1935, 86 y.o.   MRN: [id number]  This visit was conducted in person.  BP 134/82   Pulse 71   Temp 97.9 F (36.6 C) (Temporal)   Ht 5\' 9"  (1.753 m)   Wt 172 lb 9 oz (78.3 kg)   SpO2 98%   BMI 25.48 kg/m    CC: preop eval, neck pain  Subjective:   HPI: Beth Rodgers is a 86 y.o. female presenting on 06/02/2021 for Neck Pain (C/o posterior right-sided neck pain and radiates into right shoulder.  Stated about 2 mos ago. ), Pre-op Exam (Scheduled for mastectomy on 06/25/21.), and Knee Pain (C/o left knee pain, worsening.  Wants to discuss ortho referral in [place]. )   Upcoming L breast total mastectomy and sentinel LN biopsy for known extensive invasive DCIS (ER+/PR+/HER2-) diagnosed 07/2020. Underwent negative genetic testing for cancer gene mutation. She is not on blood thinners.   She had initial partial mastectomy/lumpectomy and tolerated this well. Unfortunately she had positive margins at that time. She has been unable to tolerate oral antiestrogens. She has seen providers at [facility] for second opinions and has decided to undergo her mastectomy locally with [facility].   She did have reassuring echocardiogram 06/2020 and low risk lexiscan myoview 05/2020 (estimated EF 67%) ([place]).   Denies chest pain, dyspnea, dizziness, headche, cough, fever, palpitations.   Has tolerated anesthesia well in the past without post-op nausea/vomiting or difficulty awakening.   Also 3 month h/o neck pain described as mid posterior neck tightness with radiation up R side of neck and into shoulder blade. No pain into arms or legs, no numbness or weakness of legs. Heating pad helps. Continues daily stretching exercises.  Chronic L knee pain ongoing for years. Has told due to osteoarthritis. Has seen [place] ortho in the past as well as completed PT, would like to establish locally in [place]. Remotely had knee injection with benefit (when she lived in  [place]). Feels L heel spur is contributing knee pain due to antalgic gait. Hasn't tried anything for pain besides diclofenac gel. Regularly using cane.      Relevant past medical, surgical, family and social history reviewed and updated as indicated. Interim medical history since our last visit reviewed. Allergies and medications reviewed and updated. Outpatient Medications Prior to Visit  Medication Sig Dispense Refill  . alendronate (FOSAMAX) 70 MG tablet     . Ascorbic Acid (VITAMIN C CR PO) Take 500 mg by mouth daily.     . calcium carbonate (TUMS EX) 750 MG chewable tablet Chew 1 tablet by mouth as needed for heartburn.     . Cholecalciferol (VITAMIN D) 50 MCG (2000 UT) CAPS Take 1 capsule (2,000 Units total) by mouth daily. 30 capsule   . clobetasol cream (TEMOVATE) 0.05 % Apply 1 application topically as directed. Qd to bid up to 5 days a week to aa psoriasis on body until clear, then prn flares, avoid face, groin, axilla 60 g 2  . diclofenac sodium (VOLTAREN) 1 % GEL Apply 1 application topically 3 (three) times daily. 1 Tube 0  . halobetasol (ULTRAVATE) 0.05 % ointment Apply 1 application topically 4 (four) times a week.  1  . levothyroxine (SYNTHROID) 75 MCG tablet Take 1 tablet (75 mcg total) by mouth daily. (Patient taking differently: Take 75 mcg by mouth daily before breakfast.) 90 tablet 3  . losartan (COZAAR) 100 MG tablet Take 1 tablet (100  mg total) by mouth daily. (Patient taking differently: Take 100 mg by mouth at bedtime.) 90 tablet 3  . Multiple Vitamin (MULTIVITAMIN) tablet Take 1 tablet by mouth daily.     . Multiple Vitamins-Minerals (ONE DAILY CALCIUM/IRON) TABS Take 1 tablet by mouth daily.    Marland Kitchen NITROSTAT 0.4 MG SL tablet Place 0.4 mg under the tongue every 5 (five) minutes as needed for chest pain.     . silver sulfADIAZINE (SILVADENE) 1 % cream Apply 1 application topically daily. 50 g 2  . tacrolimus (PROTOPIC) 0.1 % ointment Apply topically 2 (two) times daily.  30 g 0  . Travoprost, BAK Free, (TRAVATAN) 0.004 % SOLN ophthalmic solution Place 1 drop into both eyes at bedtime.    . betamethasone valerate (VALISONE) 0.1 % cream Apply topically daily. Limit to 1 wk use in a row 15 g 0  . naproxen (NAPROSYN) 375 MG tablet Take 1 tablet (375 mg total) by mouth 2 (two) times daily as needed. Take with food (Patient not taking: Reported on 02/28/2021) 30 tablet 0   No facility-administered medications prior to visit.     Per HPI unless specifically indicated in ROS section below Review of Systems Objective:  BP 134/82   Pulse 71   Temp 97.9 F (36.6 C) (Temporal)   Ht 5\' 9"  (1.753 m)   Wt 172 lb 9 oz (78.3 kg)   SpO2 98%   BMI 25.48 kg/m   Wt Readings from Last 3 Encounters:  06/02/21 172 lb 9 oz (78.3 kg)  02/28/21 176 lb 8 oz (80.1 kg)  01/10/21 175 lb 6 oz (79.5 kg)      Physical Exam Vitals and nursing note reviewed.  Constitutional:      Appearance: Normal appearance. She is not ill-appearing.  Eyes:     Extraocular Movements: Extraocular movements intact.     Pupils: Pupils are equal, round, and reactive to light.  Cardiovascular:     Rate and Rhythm: Normal rate and regular rhythm.     Pulses: Normal pulses.     Heart sounds: Normal heart sounds. No murmur heard.   Pulmonary:     Effort: Pulmonary effort is normal. No respiratory distress.     Breath sounds: Normal breath sounds. No wheezing, rhonchi or rales.  Musculoskeletal:     Cervical back: Normal range of motion.     Right lower leg: No edema.     Left lower leg: No edema.     Comments:  Mild discomfort midline cervical spine, but predominantly at R paraspinous mm into R trapezius. Limited ROM with lateral rotation and lateral flexion due to neck tightness/pain.  R knee WNL L Knee exam: No deformity on inspection. Discomfort/pain with palpation of anterior and lateral > medial knee No effusion/swelling noted. FROM in flex/extension with mild crepitus and pain at  end-extension. No popliteal fullness. Neg drawer test. Neg mcmurray test. No pain with valgus/varus stress. No PFgrind. No abnormal patellar mobility.   Lymphadenopathy:     Cervical: No cervical adenopathy.  Skin:    General: Skin is warm and dry.     Findings: No rash.  Neurological:     Mental Status: She is alert.  Psychiatric:        Mood and Affect: Mood normal.        Behavior: Behavior normal.       Results for orders placed or performed in visit on 06/02/21  Basic metabolic panel  Result Value Ref Range  Sodium 139 135 - 145 mEq/L   Potassium 4.3 3.5 - 5.1 mEq/L   Chloride 102 96 - 112 mEq/L   CO2 27 19 - 32 mEq/L   Glucose, Bld 82 70 - 99 mg/dL   BUN 19 6 - 23 mg/dL   Creatinine, Ser 0.72 0.40 - 1.20 mg/dL   GFR 76.97 >60.00 mL/min   Calcium 9.8 8.4 - 10.5 mg/dL  CBC with Differential/Platelet  Result Value Ref Range   WBC 6.3 4.0 - 10.5 K/uL   RBC 4.77 3.87 - 5.11 Mil/uL   Hemoglobin 13.9 12.0 - 15.0 g/dL   HCT 40.9 36.0 - 46.0 %   MCV 85.7 78.0 - 100.0 fl   MCHC 34.1 30.0 - 36.0 g/dL   RDW 13.9 11.5 - 15.5 %   Platelets 230.0 150.0 - 400.0 K/uL   Neutrophils Relative % 62.0 43.0 - 77.0 %   Lymphocytes Relative 25.2 12.0 - 46.0 %   Monocytes Relative 11.0 3.0 - 12.0 %   Eosinophils Relative 1.5 0.0 - 5.0 %   Basophils Relative 0.3 0.0 - 3.0 %   Neutro Abs 3.9 1.4 - 7.7 K/uL   Lymphs Abs 1.6 0.7 - 4.0 K/uL   Monocytes Absolute 0.7 0.1 - 1.0 K/uL   Eosinophils Absolute 0.1 0.0 - 0.7 K/uL   Basophils Absolute 0.0 0.0 - 0.1 K/uL  TSH  Result Value Ref Range   TSH 2.44 0.35 - 4.50 uIU/mL   DG Chest 2 View CLINICAL DATA:  Preop eval  EXAM: CHEST - 2 VIEW  COMPARISON:  Chest CT 01/04/2021  FINDINGS: The cardiac silhouette is within normal limits. Mild tortuosity of the thoracic aorta with known enlarged ascending aorta as seen on prior CT in January 2022. There is no focal airspace disease. There is no pleural effusion or visible pneumothorax.  There is no acute osseous abnormality. Thoracic spondylosis.  IMPRESSION: No evidence of acute cardiopulmonary disease.  Electronically Signed   By: Maurine Simmering   On: 06/02/2021 17:01 DG Knee AP/LAT W/Sunrise Left CLINICAL DATA:  Chronic left knee pain  EXAM: LEFT KNEE 3 VIEWS  COMPARISON:  None.  FINDINGS: There is tricompartment osteophyte formation with moderate lateral and severe patellofemoral joint space narrowing. Small joint effusion. No evidence of acute fracture.  IMPRESSION: Tricompartment osteoarthritis of the left knee, with moderate lateral and severe patellofemoral joint space narrowing. Small effusion.  Electronically Signed   By: Maurine Simmering   On: 06/02/2021 16:59   EKG - NSR rate 60s, normal axis, intervals, no hypertrophy or acute ST/T changes  Assessment & Plan:  This visit occurred during the SARS-CoV-2 public health emergency.  Safety protocols were in place, including screening questions prior to the visit, additional usage of staff PPE, and extensive cleaning of exam room while observing appropriate contact time as indicated for disinfecting solutions.   Problem List Items Addressed This Visit    Osteoarthritis   Hypothyroidism    Update thyroid function on levothyroxine 79mcg daily.       Relevant Orders   TSH (Completed)   TSH   Left knee pain    Anticipate flare of known L knee osteoarthritis. rec start voltaren gel topically. Check films today. rec f/u with sports medicine to consider steroid injection.       Relevant Orders   DG Knee AP/LAT W/Sunrise Left (Completed)   Neck pain on right side    Anticipate muscular pain with trapezius mm tightness.  rec start with scheduled tylenol 500-1000mg   nightly, continue heating pad. Avoid muscle relaxant for now given age.       Malignant neoplasm of overlapping sites of left breast in female, estrogen receptor positive ([place])    Appreciate onc and gen-surg care.  Planned upcoming total  mastectomy.       Pre-op evaluation - Primary    RCRI = 0 Anticipate adequately low risk to proceed with upcoming planned total mastectomy.  Will forward labs/results attn Dr Windell Moment.      Relevant Orders   EKG 12-Lead (Completed)   Basic metabolic panel (Completed)   CBC with Differential/Platelet (Completed)   DG Chest 2 View (Completed)       No orders of the defined types were placed in this encounter.  Orders Placed This Encounter  Procedures  . DG Chest 2 View    Standing Status:   Future    Number of Occurrences:   1    Standing Expiration Date:   06/02/2022    Order Specific Question:   Reason for Exam (SYMPTOM  OR DIAGNOSIS REQUIRED)    Answer:   preop eval    Order Specific Question:   Preferred imaging location?    Answer:   [facility]  . DG Knee AP/LAT W/Sunrise Left    Standing Status:   Future    Number of Occurrences:   1    Standing Expiration Date:   06/02/2022    Order Specific Question:   Reason for Exam (SYMPTOM  OR DIAGNOSIS REQUIRED)    Answer:   standing xray for chronic left knee pain    Order Specific Question:   Preferred imaging location?    Answer:   [facility]  . Basic metabolic panel  . CBC with Differential/Platelet  . TSH  . TSH    Standing Status:   Future    Standing Expiration Date:   05/26/2022  . EKG 12-Lead    Patient Instructions  Labs today Chest xray and EKG today  We will forward results attn Dr Windell Moment.  I hope you have a speedy recovery!  Para cuello - creo que tiene musculos tiesos afectando dolor de cuello. Siga heating pad, comienze tylenol $RemoveBeforeDE'500mg'[id number]$  1-2 de noche.  Para rodilla - rayos x de rodilla izquierda hoy. Haga cita con Dr Lorelei Pont para considerar inyeccion de corticosteroide a la rodilla.   Follow up plan: Return if symptoms worsen or fail to improve.  Ria Bush, MD

## 2021-06-03 LAB — BASIC METABOLIC PANEL
BUN: 19 mg/dL (ref 6–23)
CO2: 27 mEq/L (ref 19–32)
Calcium: 9.8 mg/dL (ref 8.4–10.5)
Chloride: 102 mEq/L (ref 96–112)
Creatinine, Ser: 0.72 mg/dL (ref 0.40–1.20)
GFR: 76.97 mL/min (ref >60.00)
Glucose, Bld: 82 mg/dL (ref 70–99)
Potassium: 4.3 mEq/L (ref 3.5–5.1)
Sodium: 139 mEq/L (ref 135–145)

## 2021-06-03 NOTE — Assessment & Plan Note (Signed)
Anticipate flare of known L knee osteoarthritis. rec start voltaren gel topically. Check films today. rec f/u with sports medicine to consider steroid injection.

## 2021-06-03 NOTE — Assessment & Plan Note (Signed)
Anticipate muscular pain with trapezius mm tightness.  rec start with scheduled tylenol 500-1000mg  nightly, continue heating pad. Avoid muscle relaxant for now given age.

## 2021-06-03 NOTE — Assessment & Plan Note (Addendum)
Appreciate onc and gen-surg care.  Planned upcoming total mastectomy.

## 2021-06-03 NOTE — Assessment & Plan Note (Signed)
Update thyroid function on levothyroxine 41mcg daily.

## 2021-06-03 NOTE — Assessment & Plan Note (Signed)
Chronic, stable on current regimen.  

## 2021-06-06 ENCOUNTER — Encounter: Payer: Medicare Other | Admitting: Family Medicine

## 2021-06-08 NOTE — Progress Notes (Signed)
Delayni Streed T. Jhony Antrim, MD, [place] at [facility] [place], [address]  Phone: [phone]  FAX: [phone]  Beth Rodgers - 86 y.o. female  MRN [id number]  Date of Birth: Sep 09, 1935  Date: 06/09/2021  PCP: Ria Bush, MD  Referral: Ria Bush, MD  Chief Complaint  Patient presents with   Knee Pain    Left    This visit occurred during the SARS-CoV-2 public health emergency.  Safety protocols were in place, including screening questions prior to the visit, additional usage of staff PPE, and extensive cleaning of exam room while observing appropriate contact time as indicated for disinfecting solutions.   Subjective:   Beth Rodgers is a 86 y.o. very pleasant female patient with Body mass index is 25.47 kg/m. who presents with the following:  The patient is seen here in consultation for knee pain courtesy of Dr. Darnell Level:  She is a very nice young 86 year old patient, she presents with some knee pain that has been ongoing and progressive some for about 30 years.  She did see a physician in [place] where she was living at the time, and she does have some arthritis.  She has done some physical therapy, and this was done recently prior to COVID-19, but as the pandemic developed she discontinued.  She has had also some chronic Achilles tendinopathy with calcific tendinopathy, and she has seen Dr. Doran Durand as well as a physician in [place] about this.  Predominantly, today she is here to talk about her left knee which hurts with basic ambulation and she has a significant valgus knee. She has pain with basic ambulation.  She has trouble rising from a seated position. L knee OA, reviewed plain films with the patient.  Inj L knee today  Per Dr. Darnell Level: Chronic L knee pain ongoing for years. Has told due to osteoarthritis. Has seen [facility] in the past as well as completed PT, would like to establish locally  in [place]. Remotely had knee injection with benefit (when she lived in [place]). Feels L heel spur is contributing knee pain due to antalgic gait. Hasn't tried anything for pain besides diclofenac gel. Regularly using cane.   Review of Systems is noted in the HPI, as appropriate   Objective:   BP 120/70   Pulse 77   Temp 98.8 F (37.1 C) (Temporal)   Ht 5\' 9"  (1.753 m)   Wt 172 lb 8 oz (78.2 kg)   SpO2 96%   BMI 25.47 kg/m   Left knee: She lacks 5 degrees of extension.  Flexion to 110.  She does have grinding at the patella and she has medial and lateral joint line tenderness is quite significant.  She has any pain with forced extension or flexion.  All ligamentous structures are intact.  On gait she has fairly dramatic pronation and she has a valgus knee with antalgic gait.  She has diffuse foot breakdown from forefoot to hindfoot.  Radiology: DG Chest 2 View  Result Date: 06/02/2021 CLINICAL DATA:  Preop eval EXAM: CHEST - 2 VIEW COMPARISON:  Chest CT 01/04/2021 FINDINGS: The cardiac silhouette is within normal limits. Mild tortuosity of the thoracic aorta with known enlarged ascending aorta as seen on prior CT in January 2022. There is no focal airspace disease. There is no pleural effusion or visible pneumothorax. There is no acute osseous abnormality. Thoracic spondylosis. IMPRESSION: No evidence of acute cardiopulmonary disease. Electronically Signed  By: Maurine Simmering   On: 06/02/2021 17:01   DG Knee AP/LAT W/Sunrise Left  Result Date: 06/02/2021 CLINICAL DATA:  Chronic left knee pain EXAM: LEFT KNEE 3 VIEWS COMPARISON:  None. FINDINGS: There is tricompartment osteophyte formation with moderate lateral and severe patellofemoral joint space narrowing. Small joint effusion. No evidence of acute fracture. IMPRESSION: Tricompartment osteoarthritis of the left knee, with moderate lateral and severe patellofemoral joint space narrowing. Small effusion. Electronically Signed   By: Maurine Simmering   On: 06/02/2021 16:59    Assessment and Plan:     ICD-10-CM   1. Primary osteoarthritis of left knee  M17.12 triamcinolone acetonide (KENALOG-40) injection 40 mg    2. Calcific Achilles tendinitis of left lower extremity  M65.28      She does have quite significant knee osteoarthritis, this is worse in the patellofemoral and lateral compartments.  Globally she does have tricompartmental arthritis as well as on exam she has quite painful medial and lateral joint line pain.  This follows along with a clinical history and exam.  I get a try to do a therapeutic steroid injection for the patient.  She does avoid oral NSAIDs and Tylenol and she is somewhat reluctant to use Voltaren gel as well, but I tried to alleviate some of her concerns.  If her symptoms persist and she is still quite impaired, consideration for consultation with total joint surgeon would be reasonable and appropriate.  Aspiration/Injection Procedure Note Beth Rodgers [birth year] Date of procedure: 06/09/2021  Procedure: Large Joint Aspiration / Injection of Knee, L Indications: Pain  Procedure Details Patient verbally consented to procedure. Risks, benefits, and alternatives explained. Sterilely prepped with Chloraprep. Ethyl cholride used for anesthesia. 9 cc Lidocaine 1% mixed with 1 mL of Kenalog 40 mg injected using the anteromedial approach without difficulty. No complications with procedure and tolerated well. Patient had decreased pain post-injection. Medication: 1 mL of Kenalog 40 mg   Meds ordered this encounter  Medications   triamcinolone acetonide (KENALOG-40) injection 40 mg   There are no discontinued medications. No orders of the defined types were placed in this encounter.   Follow-up: No follow-ups on file.  Signed,  Maud Deed. Marquelle Balow, MD   Outpatient Encounter Medications as of 06/09/2021  Medication Sig   alendronate (FOSAMAX) 70 MG tablet    Ascorbic Acid (VITAMIN C CR PO) Take  500 mg by mouth daily.    calcium carbonate (TUMS EX) 750 MG chewable tablet Chew 1 tablet by mouth as needed for heartburn.    Cholecalciferol (VITAMIN D) 50 MCG (2000 UT) CAPS Take 1 capsule (2,000 Units total) by mouth daily.   clobetasol cream (TEMOVATE) 9.15 % Apply 1 application topically as directed. Qd to bid up to 5 days a week to aa psoriasis on body until clear, then prn flares, avoid face, groin, axilla   diclofenac sodium (VOLTAREN) 1 % GEL Apply 1 application topically 3 (three) times daily.   halobetasol (ULTRAVATE) 0.05 % ointment Apply 1 application topically 4 (four) times a week.   levothyroxine (SYNTHROID) 75 MCG tablet Take 1 tablet (75 mcg total) by mouth daily. (Patient taking differently: Take 75 mcg by mouth daily before breakfast.)   losartan (COZAAR) 100 MG tablet Take 1 tablet (100 mg total) by mouth daily. (Patient taking differently: Take 100 mg by mouth at bedtime.)   Multiple Vitamin (MULTIVITAMIN) tablet Take 1 tablet by mouth daily.    Multiple Vitamins-Minerals (ONE DAILY CALCIUM/IRON) TABS Take 1 tablet by  mouth daily.   NITROSTAT 0.4 MG SL tablet Place 0.4 mg under the tongue every 5 (five) minutes as needed for chest pain.    silver sulfADIAZINE (SILVADENE) 1 % cream Apply 1 application topically daily.   tacrolimus (PROTOPIC) 0.1 % ointment Apply topically 2 (two) times daily.   Travoprost, BAK Free, (TRAVATAN) 0.004 % SOLN ophthalmic solution Place 1 drop into both eyes at bedtime.   [EXPIRED] triamcinolone acetonide (KENALOG-40) injection 40 mg    No facility-administered encounter medications on file as of 06/09/2021.

## 2021-06-09 ENCOUNTER — Encounter: Payer: Self-pay | Admitting: Family Medicine

## 2021-06-09 ENCOUNTER — Ambulatory Visit (INDEPENDENT_AMBULATORY_CARE_PROVIDER_SITE_OTHER): Payer: Medicare Other | Admitting: Family Medicine

## 2021-06-09 ENCOUNTER — Other Ambulatory Visit: Payer: Self-pay

## 2021-06-09 VITALS — BP 120/70 | HR 77 | Temp 98.8°F | Ht 69.0 in | Wt 172.5 lb

## 2021-06-09 DIAGNOSIS — M1712 Unilateral primary osteoarthritis, left knee: Secondary | ICD-10-CM | POA: Diagnosis not present

## 2021-06-09 DIAGNOSIS — M6528 Calcific tendinitis, other site: Secondary | ICD-10-CM

## 2021-06-09 MED ORDER — TRIAMCINOLONE ACETONIDE 40 MG/ML IJ SUSP
40.0000 mg | Freq: Once | INTRAMUSCULAR | Status: AC
  Administered 2021-06-09: 40 mg via INTRA_ARTICULAR

## 2021-06-10 ENCOUNTER — Ambulatory Visit (INDEPENDENT_AMBULATORY_CARE_PROVIDER_SITE_OTHER): Payer: Medicare Other | Admitting: Psychology

## 2021-06-10 DIAGNOSIS — F4323 Adjustment disorder with mixed anxiety and depressed mood: Secondary | ICD-10-CM | POA: Diagnosis not present

## 2021-06-13 ENCOUNTER — Ambulatory Visit: Payer: Medicare Other | Admitting: Physician Assistant

## 2021-06-15 ENCOUNTER — Other Ambulatory Visit: Payer: Self-pay | Admitting: Dermatology

## 2021-06-17 ENCOUNTER — Encounter
Admission: RE | Admit: 2021-06-17 | Discharge: 2021-06-17 | Disposition: A | Discharge status: Home / Self Care | Payer: Medicare Other | Source: Ambulatory Visit | Attending: General Surgery | Admitting: General Surgery

## 2021-06-17 ENCOUNTER — Other Ambulatory Visit: Payer: Self-pay

## 2021-06-17 HISTORY — DX: Psoriasis, unspecified: L40.9

## 2021-06-17 NOTE — Patient Instructions (Addendum)
Your procedure is scheduled on: Wed. 6/29 Report to Day Surgery.  Go to registration desk in medical mall first To find out your arrival time please call [phone] between 1PM - 3PM on  Tues 6/28.  Remember: Instructions that are not followed completely may result in serious medical risk,  up to and including death, or upon the discretion of your surgeon and anesthesiologist your  surgery may need to be rescheduled.     _X__ 1. Do not eat food after midnight the night before your procedure.                 No chewing gum or hard candies. You may drink clear liquids up to 2 hours                 before you are scheduled to arrive for your surgery- DO not drink clear                 liquids within 2 hours of the start of your surgery.                 Clear Liquids include:  water, apple juice without pulp, clear Gatorade, G2 or                  Gatorade Zero (avoid Red/Purple/Blue), Black Coffee or Tea (Do not add                 anything to coffee or tea). _____2.   Complete the "Ensure Clear Pre-surgery Clear Carbohydrate Drink" provided to you, 2 hours before arrival. **If you       are diabetic you will be provided with an alternative drink, Gatorade Zero or G2.  __X__2.  On the morning of surgery brush your teeth with toothpaste and water, you                may rinse your mouth with mouthwash if you wish.  Do not swallow any toothpaste of mouthwash.     ___ 3.  No Alcohol for 24 hours before or after surgery.   ___ 4.  Do Not Smoke or use e-cigarettes For 24 Hours Prior to Your Surgery.                 Do not use any chewable tobacco products for at least 6 hours prior to                 Surgery.  ___  5.  Do not use any recreational drugs (marijuana, cocaine, heroin, ecstasy, MDMA or other)                For at least one week prior to your surgery.  Combination of these drugs with anesthesia                May have life threatening results.  ____  6.   Bring all medications with you on the day of surgery if instructed.   __x__  7.  Notify your doctor if there is any change in your medical condition      (cold, fever, infections).     Do not wear jewelry, make-up, hairpins, clips or nail polish. Do not wear lotions, powders, or perfumes.  Do not shave 48 hours prior to surgery.  Do not bring valuables to the hospital.    [facility] is not responsible for any belongings or valuables.  Contacts, dentures or bridgework may not be worn into surgery.  Leave your suitcase in the car. After surgery it may be brought to your room. For patients admitted to the hospital, discharge time is determined by your treatment team.   Patients discharged the day of surgery will not be allowed to drive home.   Make arrangements for someone to be with you for the first 24 hours of your Same Day Discharge.    Please read over the following fact sheets that you were given:       __x__ Take these medicines the morning of surgery with A SIP OF WATER:    1.levothyroxine (SYNTHROID) 75 MCG tablet  2.   3.   4.  5.  6.  ____ Fleet Enema (as directed)   ____ Use CHG Soap (or wipes) as directed  ____ Use Benzoyl Peroxide Gel as instructed  ____ Use inhalers on the day of surgery  ____ Stop metformin 2 days prior to surgery    ____ Take 1/2 of usual insulin dose the night before surgery. No insulin the morning          of surgery.   ____ Stop Coumadin/Plavix/aspirin on   __x__ Stop Anti-inflammatories no ibuprofen or aleve or aspirin products 1 week prior to the surgery   __x__ Stop supplements until after surgery.  vitamin C (ASCORBIC ACID) 500 MG tablet, vitamin E 180 MG (400 UNITS) capsule  May continue other vitamins.  ____ Bring C-Pap to the hospital.    If you have any questions regarding your pre-procedure instructions,  Please call Pre-admit Testing at [facility]

## 2021-06-18 ENCOUNTER — Ambulatory Visit: Payer: Medicare Other

## 2021-06-19 ENCOUNTER — Other Ambulatory Visit: Payer: Self-pay

## 2021-06-19 ENCOUNTER — Ambulatory Visit (INDEPENDENT_AMBULATORY_CARE_PROVIDER_SITE_OTHER): Payer: Medicare Other

## 2021-06-19 DIAGNOSIS — Z0000 Encounter for general adult medical examination without abnormal findings: Secondary | ICD-10-CM | POA: Diagnosis not present

## 2021-06-19 NOTE — Progress Notes (Signed)
Subjective:   Beth Rodgers is a 86 y.o. female who presents for Medicare Annual (Subsequent) preventive examination.  Review of Systems: N/A     I connected with the patient today by telephone and verified that I am speaking with the correct person using two identifiers. Location patient: home Location nurse: work Persons participating in the telephone visit: patient, nurse.   I discussed the limitations, risks, security and privacy concerns of performing an evaluation and management service by telephone and the availability of in person appointments. I also discussed with the patient that there may be a patient responsible charge related to this service. The patient expressed understanding and verbally consented to this telephonic visit.        Cardiac Risk Factors include: advanced age (>90men, >70 women);hypertension     Objective:    Today's Vitals   There is no height or weight on file to calculate BMI.  Advanced Directives 06/19/2021 06/17/2021 09/09/2020 08/21/2020 05/31/2020 05/26/2019 11/29/2018  Does Patient Have a Medical Advance Directive? No No No No No No No  Does patient want to make changes to medical advance directive? No - Patient declined - - - - - -  Would patient like information on creating a medical advance directive? - No - Patient declined - No - Patient declined No - Patient declined No - Patient declined Yes (MAU/Ambulatory/Procedural Areas - Information given)    Current Medications (verified) Outpatient Encounter Medications as of 06/19/2021  Medication Sig   alendronate (FOSAMAX) 70 MG tablet Take 70 mg by mouth every Sunday.   Calcium Carb-Cholecalciferol (CALCIUM 600 + D PO) Take 1 tablet by mouth daily.   calcium carbonate (TUMS EX) 750 MG chewable tablet Chew 750 mg by mouth daily as needed for heartburn.   Ciclopirox 0.77 % gel Apply 1 application topically 2 (two) times daily as needed (fungus).   clobetasol cream (TEMOVATE) 8.36 % Apply 1  application topically as directed. Qd to bid up to 5 days a week to aa psoriasis on body until clear, then prn flares, avoid face, groin, axilla (Patient taking differently: Apply 1 application topically 2 (two) times daily.)   diclofenac sodium (VOLTAREN) 1 % GEL Apply 1 application topically 3 (three) times daily. (Patient taking differently: Apply 1 application topically 3 (three) times daily as needed (pain).)   levothyroxine (SYNTHROID) 75 MCG tablet Take 1 tablet (75 mcg total) by mouth daily. (Patient taking differently: Take 75 mcg by mouth daily before breakfast.)   losartan (COZAAR) 100 MG tablet Take 1 tablet (100 mg total) by mouth daily. (Patient taking differently: Take 100 mg by mouth at bedtime.)   Magnesium 400 MG TABS Take 400 mg by mouth daily.   Multiple Vitamin (MULTIVITAMIN) tablet Take 1 tablet by mouth daily.    NITROSTAT 0.4 MG SL tablet Place 0.4 mg under the tongue every 5 (five) minutes as needed for chest pain.    silver sulfADIAZINE (SILVADENE) 1 % cream Apply 1 application topically daily. (Patient taking differently: Apply 1 application topically 2 (two) times daily.)   tacrolimus (PROTOPIC) 0.1 % ointment APPLY TOPICALLY TO AFFECTED AREA(S) TWICE DAILY   Travoprost, BAK Free, (TRAVATAN) 0.004 % SOLN ophthalmic solution Place 1 drop into both eyes at bedtime.   vitamin C (ASCORBIC ACID) 500 MG tablet Take 500 mg by mouth daily.   vitamin E 180 MG (400 UNITS) capsule Take 400 Units by mouth daily.   Cholecalciferol (VITAMIN D) 50 MCG (2000 UT) CAPS Take 1 capsule (  2,000 Units total) by mouth daily. (Patient not taking: No sig reported)   No facility-administered encounter medications on file as of 06/19/2021.    Allergies (verified) Naproxen, Anastrozole, and Exemestane   History: Past Medical History:  Diagnosis Date   Basal cell carcinoma (BCC)    txted with MOHs in past   Cellulitis 08/10/2018   Chronic venous insufficiency    with varicose veins   Family  history of breast cancer    Family history of thyroid cancer    GERD (gastroesophageal reflux disease)    Glaucoma    History  of basal cell carcinoma    left nare/BREAST CANCER   History of chicken pox    Hypertension    Hypoglycemia    Hypothyroid    Osteoarthritis    back pain, L knee pain   Osteopenia 06/2009   DEXA 11/2015: T -2.3 hip, -2.2 spine, on longterm bisphosphonate/evista   Psoriasis    Pyogenic granuloma 04/16/2016   Past Surgical History:  Procedure Laterality Date   BASAL CELL CARCINOMA EXCISION     located on face   BREAST BIOPSY Left    core done ing [place]?   BREAST BIOPSY Left 08/16/2020   3 area bx 4:00 X, IMC, 6:00Q IMC, LN hydro #3-benign   BREAST LUMPECTOMY Left 09/11/2020   two areas of [facility]   CATARACT EXTRACTION     bilateral   DEXA  06/2009   T score Spine: -1.8, hip -2.0   EXCISION OF BREAST BIOPSY Left 09/11/2020   Procedure: EXCISION OF BREAST BIOPSY;  Surgeon: [name], MD;  Location: [facility] ORS;  Service: General;  Laterality: Left;   Foam sclerotherapy Bilateral 09/2015   [place], [place]   NASAL RECONSTRUCTION  2005   for [facility] L nare s/p Mohs   nuclear stress test  2014   normal in [place] LYMPH NODE BX Left 09/11/2020   Procedure: PARTIAL MASTECTOMY WITH NEEDLE LOCALIZATION AND AXILLARY SENTINEL LYMPH NODE BX;  Surgeon: [name], MD;  Location: [facility] ORS;  Service: General;  Laterality: Left;   RADIOFREQUENCY ABLATION Left 01/2012   remnant L G saphenous vein below knee   RADIOFREQUENCY ABLATION Right 02/2013   RLE vein ablation    VEIN LIGATION AND STRIPPING  remote   bilateral G saphenous veins   Family History  Problem Relation Age of Onset   Cancer Mother        thyroid cancer   Heart disease Father        CHF   Diabetes Father    Glaucoma Father    Arthritis Father    Coronary artery disease Maternal Uncle    Bipolar disorder  Son    Heart disease Sister        CHF   Stroke Sister    Brain cancer Grandson        astrocytoma   Breast cancer Cousin        dx 37s   Social History   Socioeconomic History   Marital status: Married    Spouse name: Not on file   Number of children: 2   Years of education: Not on file   Highest education level: Not on file  Occupational History    Employer: RETIRED  Tobacco Use   Smoking status: Never   Smokeless tobacco: Never  Vaping Use   Vaping Use: Never used  Substance and Sexual Activity   Alcohol use: Yes  Comment: Occasionally drinks wine   Drug use: No   Sexual activity: Not Currently    Birth control/protection: Post-menopausal  Other Topics Concern   Not on file  Social History Narrative   From [place], [place]   Caffeine: 2-3 cups/day   Lives with husband, majority of time alone as he travels to [place].   Has grandchildren nearby.   Occ: Field seismologist, Metallurgist   Activity: bed exercises, limiting walking   Diet: does get fruits and vegetables, only some water   Social Determinants of Radio broadcast assistant Strain: Low Risk    Difficulty of Paying Living Expenses: Not hard at all  Food Insecurity: No Food Insecurity   Worried About Charity fundraiser in the Last Year: Never true   Arboriculturist in the Last Year: Never true  Transportation Needs: No Transportation Needs   Lack of Transportation (Medical): No   Lack of Transportation (Non-Medical): No  Physical Activity: Sufficiently Active   Days of Exercise per Week: 7 days   Minutes of Exercise per Session: 30 min  Stress: No Stress Concern Present   Feeling of Stress : Not at all  Social Connections: Not on file    Tobacco Counseling Counseling given: Not Answered   Clinical Intake:  Pre-visit preparation completed: Yes  Pain : 0-10 Pain Type: Chronic pain Pain Location: Knee Pain Descriptors / Indicators: Aching Pain Onset: More than a month ago Pain  Frequency: Intermittent     Diabetes: No  How often do you need to have someone help you when you read instructions, pamphlets, or other written materials from your doctor or pharmacy?: 1 - Never  Diabetic: No Nutrition Risk Assessment:  Has the patient had any N/V/D within the last 2 months?  No  Does the patient have any non-healing wounds?  No  Has the patient had any unintentional weight loss or weight gain?  No   Diabetes:  Is the patient diabetic?  No  If diabetic, was a CBG obtained today?   N/A Did the patient bring in their glucometer from home?   N/A How often do you monitor your CBG's? N/A.   Financial Strains and Diabetes Management:  Are you having any financial strains with the device, your supplies or your medication?  N/A .  Does the patient want to be seen by Chronic Care Management for management of their diabetes?   N/A Would the patient like to be referred to a Nutritionist or for Diabetic Management?   N/A  Interpreter Needed?: No  Information entered by :: CJohnson, LPN   Activities of Daily Living In your present state of health, do you have any difficulty performing the following activities: 06/19/2021 06/17/2021  Hearing? N N  Vision? N N  Difficulty concentrating or making decisions? N N  Walking or climbing stairs? N Y  Comment - -  Dressing or bathing? N N  Doing errands, shopping? N N  Preparing Food and eating ? N -  Using the Toilet? N -  In the past six months, have you accidently leaked urine? Y -  Do you have problems with loss of bowel control? N -  Managing your Medications? N -  Managing your Finances? N -  Housekeeping or managing your Housekeeping? N -  Some recent data might be hidden    Patient Care Team: Ria Bush, MD as PCP - General (Family Medicine) Rico Junker, RN as Registered Nurse  Indicate any recent  Medical Services you may have received from other than [facility] providers in the past year (date may be  approximate).     Assessment:   This is a routine wellness examination for Beth Rodgers.  Hearing/Vision screen Vision Screening - Comments:: Patient gets annual eye exams.  Dietary issues and exercise activities discussed: Current Exercise Habits: Home exercise routine, Type of exercise: stretching, Time (Minutes): 30, Frequency (Times/Week): 7, Weekly Exercise (Minutes/Week): 210, Intensity: Moderate, Exercise limited by: None identified   Goals Addressed             This Visit's Progress    Patient Stated       06/19/2021, I will continue to do stretching exercises daily for about 30 minutes.         Depression Screen PHQ 2/9 Scores 06/19/2021 05/31/2020 05/26/2019 03/09/2018 02/19/2017 12/18/2016 10/23/2016  PHQ - 2 Score 0 0 0 0 0 2 0  PHQ- 9 Score 0 0 0 0 - 3 -    Fall Risk Fall Risk  06/19/2021 05/31/2020 05/26/2019 03/09/2018 02/19/2017  Falls in the past year? 1 0 1 No No  Comment - - fell in hospital - -  Number falls in past yr: 0 0 0 - -  Injury with Fall? 0 0 1 - -  Risk for fall due to : Medication side effect Medication side effect - - -  Follow up Falls evaluation completed;Falls prevention discussed Falls evaluation completed;Falls prevention discussed - - -    FALL RISK PREVENTION PERTAINING TO THE HOME:  Any stairs in or around the home? Yes  If so, are there any without handrails? No  Home free of loose throw rugs in walkways, pet beds, electrical cords, etc? Yes  Adequate lighting in your home to reduce risk of falls? Yes   ASSISTIVE DEVICES UTILIZED TO PREVENT FALLS:  Life alert? No  Use of a cane, walker or w/c? yes  Grab bars in the bathroom? No  Shower chair or bench in shower? No  Elevated toilet seat or a handicapped toilet? No   TIMED UP AND GO:  Was the test performed?  N/A telephone visit  .    Cognitive Function: MMSE - Mini Mental State Exam 06/19/2021 05/31/2020 05/26/2019 03/09/2018 02/19/2017  Orientation to time 5 5 5 5 5   Orientation to Place  5 5 5 5 5   Registration 3 3 3 3 3   Attention/ Calculation 5 5 0 0 0  Recall 3 3 3 3 3   Language- name 2 objects - - 0 0 0  Language- repeat 1 1 1 1 1   Language- follow 3 step command - - 0 3 3  Language- read & follow direction - - 0 0 0  Write a sentence - - 0 0 0  Copy design - - 0 0 0  Total score - - 17 20 20   Mini Cog  Mini-Cog screen was completed. Maximum score is 22. A value of 0 denotes this part of the MMSE was not completed or the patient failed this part of the Mini-Cog screening.       Immunizations Immunization History  Administered Date(s) Administered   Fluad Quad(high Dose 65+) 09/22/2019   Hep A / Hep B 06/05/2015, 12/17/2015   Influenza Split 11/15/2012   Influenza, High Dose Seasonal PF 11/14/2014, 10/28/2020   Influenza,inj,Quad PF,6+ Mos 09/06/2013, 12/18/2016, 10/05/2018   PFIZER(Purple Top)SARS-COV-2 Vaccination 01/28/2020, 02/20/2020, 12/09/2020   Pneumococcal Conjugate-13 10/30/2014   Pneumococcal Polysaccharide-23 11/15/2012   Td 05/24/2012  Zoster, Live 07/03/2014    TDAP status: Up to date  Flu Vaccine status: Up to date  Pneumococcal vaccine status: Up to date  Covid-19 vaccine status: Completed 3 vaccines. Will discuss booster with provider.  Qualifies for Shingles Vaccine? Yes   Zostavax completed Yes   Shingrix Completed?: No.    Education has been provided regarding the importance of this vaccine. Patient has been advised to call insurance company to determine out of pocket expense if they have not yet received this vaccine. Advised may also receive vaccine at local pharmacy or Health Dept. Verbalized acceptance and understanding.  Screening Tests Health Maintenance  Topic Date Due   Zoster Vaccines- Shingrix (1 of 2) Never done   COVID-19 Vaccine (4 - Booster for Pfizer series) 03/09/2021   INFLUENZA VACCINE  07/28/2021   TETANUS/TDAP  05/24/2022   DEXA SCAN  10/31/2022   PNA vac Low Risk Adult  Completed   HPV VACCINES  Aged  Out    Health Maintenance  Health Maintenance Due  Topic Date Due   Zoster Vaccines- Shingrix (1 of 2) Never done   COVID-19 Vaccine (4 - Booster for Pfizer series) 03/09/2021    Colorectal cancer screening: No longer required.   Mammogram status: Completed 11/18/2020. Repeat every year  Bone Density status: Completed 10/31/2020. Results reflect: Bone density results: OSTEOPENIA. Repeat every 2 years.  Lung Cancer Screening: (Low Dose CT Chest recommended if Age 5-80 years, 30 pack-year currently smoking OR have quit w/in 15years.) does not qualify.    Additional Screening:  Hepatitis C Screening: does not qualify; Completed N/A  Vision Screening: Recommended annual ophthalmology exams for early detection of glaucoma and other disorders of the eye. Is the patient up to date with their annual eye exam?  Yes  Who is the provider or what is the name of the office in which the patient attends annual eye exams? [facility] If pt is not established with a provider, would they like to be referred to a provider to establish care? No .   Dental Screening: Recommended annual dental exams for proper oral hygiene  Community Resource Referral / Chronic Care Management: CRR required this visit?  No   CCM required this visit?  No      Plan:     I have personally reviewed and noted the following in the patient's chart:   Medical and social history Use of alcohol, tobacco or illicit drugs  Current medications and supplements including opioid prescriptions.  Functional ability and status Nutritional status Physical activity Advanced directives List of other physicians Hospitalizations, surgeries, and ER visits in previous 12 months Vitals Screenings to include cognitive, depression, and falls Referrals and appointments  In addition, I have reviewed and discussed with patient certain preventive protocols, quality metrics, and best practice recommendations. A written  personalized care plan for preventive services as well as general preventive health recommendations were provided to patient.   Due to this being a telephonic visit, the after visit summary with patients personalized plan was offered to patient via office or my-chart. Patient preferred to pick up at office at next visit or via mychart.   Andrez Grime, LPN   [id number]

## 2021-06-19 NOTE — Patient Instructions (Signed)
Beth Rodgers , Thank you for taking time to come for your Medicare Wellness Visit. I appreciate your ongoing commitment to your health goals. Please review the following plan we discussed and let me know if I can assist you in the future.   Screening recommendations/referrals: Colonoscopy: no longer required  Mammogram: Up to date, completed 11/18/2020, due 10/2021 Bone Density: Up to date, completed 10/31/2020, due 10/2022 Recommended yearly ophthalmology/optometry visit for glaucoma screening and checkup Recommended yearly dental visit for hygiene and checkup  Vaccinations: Influenza vaccine: Up to date, completed 10/28/2020, due 07/2021 Pneumococcal vaccine: Completed series Tdap vaccine: Up to date, completed 05/24/2012, due 04/2022 Shingles vaccine: due, check with your insurance regarding coverage if interested    Covid-19:completed 3 vaccines, will discuss booster with provider   Advanced directives: Advance directive discussed with you today. Even though you declined this today please call our office should you change your mind and we can give you the proper paperwork for you to fill out.  Conditions/risks identified: hypertension   Next appointment: Follow up in one year for your annual wellness visit    Preventive Care 85 Years and Older, Female Preventive care refers to lifestyle choices and visits with your health care provider that can promote health and wellness. What does preventive care include? A yearly physical exam. This is also called an annual well check. Dental exams once or twice a year. Routine eye exams. Ask your health care provider how often you should have your eyes checked. Personal lifestyle choices, including: Daily care of your teeth and gums. Regular physical activity. Eating a healthy diet. Avoiding tobacco and drug use. Limiting alcohol use. Practicing safe sex. Taking low-dose aspirin every day. Taking vitamin and mineral supplements as recommended  by your health care provider. What happens during an annual well check? The services and screenings done by your health care provider during your annual well check will depend on your age, overall health, lifestyle risk factors, and family history of disease. Counseling  Your health care provider may ask you questions about your: Alcohol use. Tobacco use. Drug use. Emotional well-being. Home and relationship well-being. Sexual activity. Eating habits. History of falls. Memory and ability to understand (cognition). Work and work Statistician. Reproductive health. Screening  You may have the following tests or measurements: Height, weight, and BMI. Blood pressure. Lipid and cholesterol levels. These may be checked every 5 years, or more frequently if you are over 74 years old. Skin check. Lung cancer screening. You may have this screening every year starting at age 67 if you have a 30-pack-year history of smoking and currently smoke or have quit within the past 15 years. Fecal occult blood test (FOBT) of the stool. You may have this test every year starting at age 19. Flexible sigmoidoscopy or colonoscopy. You may have a sigmoidoscopy every 5 years or a colonoscopy every 10 years starting at age 41. Hepatitis C blood test. Hepatitis B blood test. Sexually transmitted disease (STD) testing. Diabetes screening. This is done by checking your blood sugar (glucose) after you have not eaten for a while (fasting). You may have this done every 1-3 years. Bone density scan. This is done to screen for osteoporosis. You may have this done starting at age 63. Mammogram. This may be done every 1-2 years. Talk to your health care provider about how often you should have regular mammograms. Talk with your health care provider about your test results, treatment options, and if necessary, the need for more tests. Vaccines  Your health care provider may recommend certain vaccines, such as: Influenza  vaccine. This is recommended every year. Tetanus, diphtheria, and acellular pertussis (Tdap, Td) vaccine. You may need a Td booster every 10 years. Zoster vaccine. You may need this after age 51. Pneumococcal 13-valent conjugate (PCV13) vaccine. One dose is recommended after age 8. Pneumococcal polysaccharide (PPSV23) vaccine. One dose is recommended after age 64. Talk to your health care provider about which screenings and vaccines you need and how often you need them. This information is not intended to replace advice given to you by your health care provider. Make sure you discuss any questions you have with your health care provider. Document Released: 01/10/2016 Document Revised: 09/02/2016 Document Reviewed: 10/15/2015 Elsevier Interactive Patient Education  2017 [place] Prevention in the Home Falls can cause injuries. They can happen to people of all ages. There are many things you can do to make your home safe and to help prevent falls. What can I do on the outside of my home? Regularly fix the edges of walkways and driveways and fix any cracks. Remove anything that might make you trip as you walk through a door, such as a raised step or threshold. Trim any bushes or trees on the path to your home. Use bright outdoor lighting. Clear any walking paths of anything that might make someone trip, such as rocks or tools. Regularly check to see if handrails are loose or broken. Make sure that both sides of any steps have handrails. Any raised decks and porches should have guardrails on the edges. Have any leaves, snow, or ice cleared regularly. Use sand or salt on walking paths during winter. Clean up any spills in your garage right away. This includes oil or grease spills. What can I do in the bathroom? Use night lights. Install grab bars by the toilet and in the tub and shower. Do not use towel bars as grab bars. Use non-skid mats or decals in the tub or shower. If you  need to sit down in the shower, use a plastic, non-slip stool. Keep the floor dry. Clean up any water that spills on the floor as soon as it happens. Remove soap buildup in the tub or shower regularly. Attach bath mats securely with double-sided non-slip rug tape. Do not have throw rugs and other things on the floor that can make you trip. What can I do in the bedroom? Use night lights. Make sure that you have a light by your bed that is easy to reach. Do not use any sheets or blankets that are too big for your bed. They should not hang down onto the floor. Have a firm chair that has side arms. You can use this for support while you get dressed. Do not have throw rugs and other things on the floor that can make you trip. What can I do in the kitchen? Clean up any spills right away. Avoid walking on wet floors. Keep items that you use a lot in easy-to-reach places. If you need to reach something above you, use a strong step stool that has a grab bar. Keep electrical cords out of the way. Do not use floor polish or wax that makes floors slippery. If you must use wax, use non-skid floor wax. Do not have throw rugs and other things on the floor that can make you trip. What can I do with my stairs? Do not leave any items on the stairs. Make sure that there are  handrails on both sides of the stairs and use them. Fix handrails that are broken or loose. Make sure that handrails are as long as the stairways. Check any carpeting to make sure that it is firmly attached to the stairs. Fix any carpet that is loose or worn. Avoid having throw rugs at the top or bottom of the stairs. If you do have throw rugs, attach them to the floor with carpet tape. Make sure that you have a light switch at the top of the stairs and the bottom of the stairs. If you do not have them, ask someone to add them for you. What else can I do to help prevent falls? Wear shoes that: Do not have high heels. Have rubber  bottoms. Are comfortable and fit you well. Are closed at the toe. Do not wear sandals. If you use a stepladder: Make sure that it is fully opened. Do not climb a closed stepladder. Make sure that both sides of the stepladder are locked into place. Ask someone to hold it for you, if possible. Clearly mark and make sure that you can see: Any grab bars or handrails. First and last steps. Where the edge of each step is. Use tools that help you move around (mobility aids) if they are needed. These include: Canes. Walkers. Scooters. Crutches. Turn on the lights when you go into a dark area. Replace any light bulbs as soon as they burn out. Set up your furniture so you have a clear path. Avoid moving your furniture around. If any of your floors are uneven, fix them. If there are any pets around you, be aware of where they are. Review your medicines with your doctor. Some medicines can make you feel dizzy. This can increase your chance of falling. Ask your doctor what other things that you can do to help prevent falls. This information is not intended to replace advice given to you by your health care provider. Make sure you discuss any questions you have with your health care provider. Document Released: 10/10/2009 Document Revised: 05/21/2016 Document Reviewed: 01/18/2015 Elsevier Interactive Patient Education  2017 [employer].

## 2021-06-19 NOTE — Progress Notes (Signed)
PCP notes:  Health Maintenance: Shingrix- due    Abnormal Screenings: none   Patient concerns: Knee pain    Nurse concerns: none   Next PCP appt.: 06/23/2021 @ 3 pm

## 2021-06-20 ENCOUNTER — Inpatient Hospital Stay: Payer: Medicare Other | Attending: Oncology | Admitting: Oncology

## 2021-06-20 ENCOUNTER — Other Ambulatory Visit: Payer: Self-pay | Admitting: Family Medicine

## 2021-06-20 VITALS — BP 148/88 | HR 85 | Temp 98.9°F | Wt 171.0 lb

## 2021-06-20 DIAGNOSIS — Z170 Estrogen receptor positive status [ER+]: Secondary | ICD-10-CM | POA: Diagnosis not present

## 2021-06-20 DIAGNOSIS — Z7189 Other specified counseling: Secondary | ICD-10-CM | POA: Diagnosis not present

## 2021-06-20 DIAGNOSIS — C50812 Malignant neoplasm of overlapping sites of left female breast: Secondary | ICD-10-CM | POA: Diagnosis not present

## 2021-06-20 NOTE — Progress Notes (Signed)
Patient here for oncology follow-up appointment, expresses concerns of neck/knee pain

## 2021-06-23 ENCOUNTER — Other Ambulatory Visit: Payer: Self-pay

## 2021-06-23 ENCOUNTER — Other Ambulatory Visit: Payer: Self-pay | Admitting: Family Medicine

## 2021-06-23 ENCOUNTER — Encounter: Payer: Self-pay | Admitting: Family Medicine

## 2021-06-23 ENCOUNTER — Other Ambulatory Visit
Admission: RE | Admit: 2021-06-23 | Discharge: 2021-06-23 | Disposition: A | Discharge status: Home / Self Care | Payer: Medicare Other | Source: Ambulatory Visit | Attending: General Surgery | Admitting: General Surgery

## 2021-06-23 ENCOUNTER — Ambulatory Visit (INDEPENDENT_AMBULATORY_CARE_PROVIDER_SITE_OTHER): Payer: Medicare Other | Admitting: Family Medicine

## 2021-06-23 VITALS — BP 136/80 | HR 69 | Temp 98.5°F | Ht 63.0 in | Wt 171.0 lb

## 2021-06-23 DIAGNOSIS — Z20822 Contact with and (suspected) exposure to covid-19: Secondary | ICD-10-CM | POA: Insufficient documentation

## 2021-06-23 DIAGNOSIS — F4321 Adjustment disorder with depressed mood: Secondary | ICD-10-CM

## 2021-06-23 DIAGNOSIS — C50812 Malignant neoplasm of overlapping sites of left female breast: Secondary | ICD-10-CM | POA: Diagnosis not present

## 2021-06-23 DIAGNOSIS — E039 Hypothyroidism, unspecified: Secondary | ICD-10-CM | POA: Diagnosis not present

## 2021-06-23 DIAGNOSIS — Z170 Estrogen receptor positive status [ER+]: Secondary | ICD-10-CM

## 2021-06-23 DIAGNOSIS — M85852 Other specified disorders of bone density and structure, left thigh: Secondary | ICD-10-CM | POA: Diagnosis not present

## 2021-06-23 DIAGNOSIS — Z01812 Encounter for preprocedural laboratory examination: Secondary | ICD-10-CM | POA: Diagnosis not present

## 2021-06-23 DIAGNOSIS — I77810 Thoracic aortic ectasia: Secondary | ICD-10-CM | POA: Diagnosis not present

## 2021-06-23 DIAGNOSIS — I872 Venous insufficiency (chronic) (peripheral): Secondary | ICD-10-CM | POA: Diagnosis not present

## 2021-06-23 DIAGNOSIS — M1712 Unilateral primary osteoarthritis, left knee: Secondary | ICD-10-CM | POA: Diagnosis not present

## 2021-06-23 DIAGNOSIS — Z6379 Other stressful life events affecting family and household: Secondary | ICD-10-CM | POA: Diagnosis not present

## 2021-06-23 LAB — SARS CORONAVIRUS 2 (TAT 6-24 HRS): SARS Coronavirus 2: NEGATIVE

## 2021-06-23 NOTE — Progress Notes (Signed)
Patient ID: Beth Rodgers, female    DOB: 11-11-1935, 86 y.o.   MRN: [id number]  This visit was conducted in person.  BP 136/80   Pulse 69   Temp 98.5 F (36.9 C) (Temporal)   Ht 5\' 3"  (1.6 m)   Wt 171 lb (77.6 kg)   SpO2 98%   BMI 30.29 kg/m    CC: AMW f/u visit Subjective:   HPI: Beth Rodgers is a 86 y.o. female presenting on 06/23/2021 for Annual Exam (Prt 2. )   Saw health advisor last week for medicare wellness visit. Note reviewed.     No results found.  Flowsheet Row Clinical Support from 06/19/2021 in [facility]  PHQ-2 Total Score 0       Fall Risk  06/19/2021 05/31/2020 05/26/2019 03/09/2018 02/19/2017  Falls in the past year? 1 0 1 No No  Comment - - fell in hospital - -  Number falls in past yr: 0 0 0 - -  Injury with Fall? 0 0 1 - -  Risk for fall due to : Medication side effect Medication side effect - - -  Follow up Falls evaluation completed;Falls prevention discussed Falls evaluation completed;Falls prevention discussed - - -    Upcoming L breast total mastectomy and sentinel LN biopsy. To be followed by local oncologist Dr [name] Banks, as well as general surgeon Dr Windell Moment.   Preventative: Declined colonoscopy, stool kits have been normal to date. Age out.  Breast cancer screening - see above.  Well woman with [facility] - has not recently seen. Denies pelvic pain, pressure, or vaginal bleeding or skin changes.  DEXA Date: 06/2009 T score Spine: -1.8, hip -2.0.  DEXA 11/2015 persistent osteopenia T -2.3. DEXA 10/2019 T -2.3 hip, spine -2.2.  DEXA 10/2020: T -2.3 L femur, -1.5 spine - fosamax restarted 10/2020.  Lung cancer screening - not eligible  Flu shot -yearly  Td 04/2012  COVID vaccine - [place] 12/2019, 01/2020, booster x1 11/2020 Pneumovax 10/2012. prevnar 2015 zostavax 06/2014  Shingrix - discussed - will check with pharmacy  Advanced directives: has packet at home, needs to work on this. Would want husband to be HCPOA.   Seat belt use discussed Sunscreen use discussed. Denies changing moles. Sees derm regularly Non smoker  Alcohol - seldom  Dentist locally. Uses night guard regularly. Eye exam Q6 mo  Bowel - no constipation - manages with prunes  Bladder - no incontinence   Caffeine: 2-3 cups/day Lives with husband, majority of time alone as he travels to [place] Has grandchildren nearby Occ: Field seismologist, Metallurgist Activity: bed exercises, limited walking Diet: does get fruits and vegetables, only some water      Relevant past medical, surgical, family and social history reviewed and updated as indicated. Interim medical history since our last visit reviewed. Allergies and medications reviewed and updated. Outpatient Medications Prior to Visit  Medication Sig Dispense Refill   alendronate (FOSAMAX) 70 MG tablet Take 70 mg by mouth every Sunday.     Calcium Carb-Cholecalciferol (CALCIUM 600 + D PO) Take 1 tablet by mouth daily.     calcium carbonate (TUMS EX) 750 MG chewable tablet Chew 750 mg by mouth daily as needed for heartburn.     Cholecalciferol (VITAMIN D) 50 MCG (2000 UT) CAPS Take 1 capsule (2,000 Units total) by mouth daily. 30 capsule    Ciclopirox 0.77 % gel Apply 1 application topically 2 (two) times daily as needed (fungus).  clobetasol cream (TEMOVATE) 4.09 % Apply 1 application topically as directed. Qd to bid up to 5 days a week to aa psoriasis on body until clear, then prn flares, avoid face, groin, axilla (Patient taking differently: Apply 1 application topically 2 (two) times daily.) 60 g 2   diclofenac sodium (VOLTAREN) 1 % GEL Apply 1 application topically 3 (three) times daily. (Patient taking differently: Apply 1 application topically 3 (three) times daily as needed (pain).) 1 Tube 0   levothyroxine (SYNTHROID) 75 MCG tablet Take 1 tablet (75 mcg total) by mouth daily. (Patient taking differently: Take 75 mcg by mouth daily before breakfast.) 90 tablet 3   losartan  (COZAAR) 100 MG tablet TAKE 1 TABLET BY MOUTH EVERY DAY 90 tablet 0   Magnesium 400 MG TABS Take 400 mg by mouth daily.     Multiple Vitamin (MULTIVITAMIN) tablet Take 1 tablet by mouth daily.     NITROSTAT 0.4 MG SL tablet Place 0.4 mg under the tongue every 5 (five) minutes as needed for chest pain.      silver sulfADIAZINE (SILVADENE) 1 % cream Apply 1 application topically daily. (Patient taking differently: Apply 1 application topically 2 (two) times daily.) 50 g 2   tacrolimus (PROTOPIC) 0.1 % ointment APPLY TOPICALLY TO AFFECTED AREA(S) TWICE DAILY 30 g 0   Travoprost, BAK Free, (TRAVATAN) 0.004 % SOLN ophthalmic solution Place 1 drop into both eyes at bedtime.     vitamin C (ASCORBIC ACID) 500 MG tablet Take 500 mg by mouth daily.     vitamin E 180 MG (400 UNITS) capsule Take 400 Units by mouth daily.     No facility-administered medications prior to visit.     Per HPI unless specifically indicated in ROS section below Review of Systems  Objective:  BP 136/80   Pulse 69   Temp 98.5 F (36.9 C) (Temporal)   Ht 5\' 3"  (1.6 m)   Wt 171 lb (77.6 kg)   SpO2 98%   BMI 30.29 kg/m   Wt Readings from Last 3 Encounters:  06/23/21 171 lb (77.6 kg)  06/20/21 171 lb (77.6 kg)  06/17/21 174 lb (78.9 kg)      Physical Exam Vitals and nursing note reviewed.  Constitutional:      Appearance: Normal appearance. She is not ill-appearing.  HENT:     Head: Normocephalic and atraumatic.     Right Ear: Tympanic membrane, ear canal and external ear normal. There is no impacted cerumen.     Left Ear: Tympanic membrane, ear canal and external ear normal. There is no impacted cerumen.  Eyes:     General:        Right eye: No discharge.        Left eye: No discharge.     Extraocular Movements: Extraocular movements intact.     Conjunctiva/sclera: Conjunctivae normal.     Pupils: Pupils are equal, round, and reactive to light.  Neck:     Thyroid: No thyroid mass or thyromegaly.   Cardiovascular:     Rate and Rhythm: Normal rate and regular rhythm.     Pulses: Normal pulses.     Heart sounds: Normal heart sounds. No murmur heard. Pulmonary:     Effort: Pulmonary effort is normal. No respiratory distress.     Breath sounds: Normal breath sounds. No wheezing, rhonchi or rales.  Abdominal:     General: Bowel sounds are normal. There is no distension.     Palpations: Abdomen is soft. There  is no mass.     Tenderness: There is no abdominal tenderness. There is no guarding or rebound.     Hernia: No hernia is present.  Musculoskeletal:     Cervical back: Normal range of motion and neck supple. No rigidity.     Right lower leg: No edema.     Left lower leg: No edema.  Lymphadenopathy:     Cervical: No cervical adenopathy.  Skin:    General: Skin is warm and dry.     Findings: No rash.  Neurological:     General: No focal deficit present.     Mental Status: She is alert. Mental status is at baseline.  Psychiatric:        Mood and Affect: Mood normal.        Behavior: Behavior normal.      Results for orders placed or performed during the hospital encounter of 06/23/21  SARS CORONAVIRUS 2 (TAT 6-24 HRS) Nasopharyngeal Nasopharyngeal Swab   Specimen: Nasopharyngeal Swab  Result Value Ref Range   SARS Coronavirus 2 NEGATIVE NEGATIVE   Lab Results  Component Value Date   TSH 2.44 06/02/2021    Assessment & Plan:  This visit occurred during the SARS-CoV-2 public health emergency.  Safety protocols were in place, including screening questions prior to the visit, additional usage of staff PPE, and extensive cleaning of exam room while observing appropriate contact time as indicated for disinfecting solutions.   Problem List Items Addressed This Visit     Osteopenia - Primary    Now back on fosamax since 10/2020 - this was restarted by oncology.        Hypothyroidism    Thyroid function stable on current regimen - continue       Chronic venous  insufficiency    Stable period. Has previously had venous interventions including vein ligation and stripping and sclerofoam to veins        Stressful life events affecting family and household    Ongoing difficulty with relationship with son with bipolar who lives with her. She continues Patent examiner.        Primary osteoarthritis of left knee    Appreciate SM care.  Saw Dr Lorelei Pont s/p steroid injection a few weeks ago.        Situational depression    Continues seeing [facility] counselor        Malignant neoplasm of overlapping sites of left breast in female, estrogen receptor positive ([facility])    Upcoming total L mastectomy and sentinel LN biopsy later this week ([facility]).  Appreciate surgery/onc care.        Ascending aorta dilatation (HCC)    Rpt imaging will be due 12/2021         No orders of the defined types were placed in this encounter.  No orders of the defined types were placed in this encounter.   Patient instructions: If interested, check with pharmacy about new 2 shot shingles series (shingrix).  Gusto verla hoy - espero que tenga una [place] y recuperacion rapida!  Regresar en 3 meses para proximo examen fiscio.    Follow up plan: Return in about 3 months (around 09/23/2021) for follow up visit.  Ria Bush, MD

## 2021-06-23 NOTE — Patient Instructions (Addendum)
If interested, check with pharmacy about new 2 shot shingles series (shingrix).  Gusto verla hoy - espero que tenga una [place] y recuperacion rapida!  Regresar en 3 meses para proximo examen fiscio.   Mantenimiento de la salud despus de los 35 aos de edad Health Maintenance After Age 84 Despus de los 65 aos de edad, corre un riesgo mayor de Tourist information centre manager enfermedades e infecciones a Barrister's clerk, como tambin de sufrir lesiones por cadas. Las cadas son la causa principal de las fracturas de huesos y lesiones en la cabeza de personas mayores de 63 aos de edad. Recibir cuidados preventivos de forma regular puede ayudarlo a mantenerse saludable y en buen [place]. Los cuidados preventivos incluyen realizarse anlisis de forma regular y Actor en el estilo de vida segn las recomendaciones del mdico. Converse con el mdico sobre lo siguiente: Las pruebas de deteccin y los anlisis que debe Dispensing optician. Una prueba de deteccin es un estudio que se para Hydrographic surveyor la presencia de una enfermedad cuando no tiene sntomas. Un plan de dieta y ejercicios adecuado para usted. Qu debo saber sobre las pruebas de deteccin y los anlisis para prevenircadas? Realizarse pruebas de deteccin y [employer] es la mejor manera de Hydrographic surveyor un problema de salud de forma temprana. El diagnstico y tratamiento tempranos le brindan la mejor oportunidad de Chief Technology Officer las afecciones mdicas que son comunes despus de los 38 aos de edad. Ciertas afecciones y elecciones de estilo de vida pueden hacer que sea ms propenso a sufrir Engineer, manufacturing. El mdico puede recomendarle lo siguiente: Controles regulares de la visin. Una visin deficiente y afecciones como las cataratas pueden hacer que sea ms propenso a sufrir Engineer, manufacturing. Si [place] lentes, asegrese de obtener una receta actualizada si su visin cambia. Revisin de medicamentos. Revise regularmente con el mdico todos los medicamentos que toma, incluidos los  medicamentos de [place]. Consulte al [employer] efectos secundarios que pueden hacer que sea ms propenso a sufrir Engineer, manufacturing. Informe al mdico si alguno de los medicamentos que toma lo hace sentir mareado o somnoliento. Pruebas de deteccin para la osteoporosis. La osteoporosis es una afeccin que hace que los huesos se vuelvan ms frgiles. En consecuencia, los huesos pueden debilitarse y quebrarse ms fcilmente. Pruebas de deteccin para la presin arterial. Los cambios en la presin arterial y los medicamentos para Chief Technology Officer la presin arterial pueden hacerlo sentir mareado. Controles de fuerza y equilibrio. El mdico puede recomendar ciertos estudios para controlar su fuerza y equilibrio al estar de pie, al caminar o al cambiar de posicin. Examen de los pies. El dolor y Chiropractor en los pies, como tambin no utilizar el calzado [place], pueden hacer que sea ms propenso a sufrir Engineer, manufacturing. Prueba de deteccin de la depresin. Es ms probable que sufra una cada si tiene miedo a caerse, se siente mal emocionalmente o se siente incapaz de Patent examiner. Prueba de deteccin de consumo de alcohol. Beber demasiado alcohol puede afectar su equilibrio y puede hacer que sea ms propenso a sufrir Engineer, manufacturing. Qu medidas puedo tomar para reducir mi riesgo de sufrir una cada? Instrucciones generales Hable con el mdico sobre sus riesgos de sufrir una cada. Infrmele al mdico si: Se cae. Asegrese de informarle a su mdico acerca de todas las cadas, incluso aquellas que parecen ser [employer] Se siente mareado, somnoliento o que pierde el equilibrio. Use los medicamentos de venta libre y los recetados solamente como se lo haya indicado el  mdico. Estos incluyen todos los suplementos. Siga una dieta sana y [place] un peso saludable. Una dieta saludable incluye productos lcteos descremados, carnes bajas en contenido de grasa ([place]), fibra de granos enteros,  frijoles y [place] frutas y verduras. Seguridad en el hogar Retire los objetos que puedan causar tropiezos tales como alfombras, cables u obstculos. Instale equipos de seguridad, como barras para sostn en los baos y barandas de seguridad en las escaleras. [place] habitaciones y los pasillos bien iluminados. Actividad  Siga un programa de ejercicio regular para mantenerse en forma. Esto lo ayudar a Contractor equilibrio. Consulte al mdico qu tipos de ejercicios son adecuados para usted. Si necesita un bastn o un andador, selo segn las recomendaciones del mdico. Utilice calzado con buen apoyo y suela antideslizante.  Estilo de vida No beba alcohol si el mdico se lo prohbe. Si bebe alcohol, limite la cantidad que consume: De 0 a 1 medida por da para las mujeres. De 0 a 2 medidas por da para los hombres. Est atento a la cantidad de alcohol que hay en las bebidas que toma. En los [place], una medida equivale a una botella tpica de cerveza (12 oz/355 ml), medio vaso de vino (5 oz/148 ml) o un trago de bebidas alcohlicas de alta graduacin (1 oz/45 ml). No consuma ningn producto que contenga nicotina o tabaco, como cigarrillos y Psychologist, sport and exercise. Si necesita ayuda para dejar de consumir, consulte al [employer]. Resumen Tener un estilo de vida saludable y recibir cuidados preventivos pueden ayudar a Theatre stage manager salud y el bienestar despus de los 67 aos de [place]. Realizarse pruebas de deteccin y [employer] es la mejor manera de Hydrographic surveyor un problema de salud de forma temprana y Lourena Simmonds a Product/process development scientist una cada. El diagnstico y tratamiento tempranos le brindan la mejor oportunidad de Chief Technology Officer las afecciones mdicas ms comunes en las personas mayores de 42 aos de edad. Las cadas son la causa principal de las fracturas de huesos y lesiones en la cabeza de personas mayores de 18 aos de edad. Tome precauciones para evitar una cada en su casa. Trabaje con el mdico para  saber qu cambios que puede hacer para mejorar su salud y [place], y [place]. Esta informacin no tiene Marine scientist el consejo del mdico. Asegresede hacerle al mdico cualquier pregunta que tenga. Document Revised: 12/24/2020 Document Reviewed: 12/24/2020 Elsevier Patient Education  2022 [employer].

## 2021-06-24 ENCOUNTER — Ambulatory Visit (INDEPENDENT_AMBULATORY_CARE_PROVIDER_SITE_OTHER): Payer: Medicare Other | Admitting: Psychology

## 2021-06-24 DIAGNOSIS — F4323 Adjustment disorder with mixed anxiety and depressed mood: Secondary | ICD-10-CM

## 2021-06-24 NOTE — Assessment & Plan Note (Signed)
Continues seeing Tourist information centre manager

## 2021-06-24 NOTE — Assessment & Plan Note (Signed)
Stable period. Has previously had venous interventions including vein ligation and stripping and sclerofoam to veins

## 2021-06-24 NOTE — Assessment & Plan Note (Signed)
Now back on fosamax since 10/2020 - this was restarted by oncology.

## 2021-06-24 NOTE — Assessment & Plan Note (Signed)
Ongoing difficulty with relationship with son with bipolar who lives with her. She continues Patent examiner.

## 2021-06-24 NOTE — Assessment & Plan Note (Addendum)
Upcoming total L mastectomy and sentinel LN biopsy later this week ([facility]).  Appreciate surgery/onc care.

## 2021-06-24 NOTE — Assessment & Plan Note (Signed)
Rpt imaging will be due 12/2021

## 2021-06-24 NOTE — Assessment & Plan Note (Signed)
Appreciate SM care.  Saw Dr Lorelei Pont s/p steroid injection a few weeks ago.

## 2021-06-24 NOTE — Assessment & Plan Note (Signed)
Thyroid function stable on current regimen - continue

## 2021-06-25 ENCOUNTER — Encounter: Payer: Self-pay | Admitting: General Surgery

## 2021-06-25 ENCOUNTER — Ambulatory Visit: Payer: Medicare Other | Admitting: Anesthesiology

## 2021-06-25 ENCOUNTER — Ambulatory Visit
Admission: RE | Admit: 2021-06-25 | Discharge: 2021-06-25 | Disposition: A | Discharge status: Home / Self Care | Payer: Medicare Other | Source: Ambulatory Visit | Attending: General Surgery | Admitting: General Surgery

## 2021-06-25 ENCOUNTER — Encounter
Admission: RE | Disposition: A | Discharge status: Home / Self Care | Payer: Self-pay | Source: Home / Self Care | Attending: General Surgery

## 2021-06-25 ENCOUNTER — Other Ambulatory Visit: Payer: Self-pay

## 2021-06-25 ENCOUNTER — Encounter
Admission: RE | Admit: 2021-06-25 | Discharge: 2021-06-25 | Disposition: A | Discharge status: Home / Self Care | Payer: Medicare Other | Source: Ambulatory Visit | Attending: General Surgery | Admitting: General Surgery

## 2021-06-25 ENCOUNTER — Observation Stay
Admission: RE | Admit: 2021-06-25 | Discharge: 2021-06-26 | Disposition: A | Discharge status: Home / Self Care | Payer: Medicare Other | Attending: General Surgery | Admitting: General Surgery

## 2021-06-25 DIAGNOSIS — D0512 Intraductal carcinoma in situ of left breast: Principal | ICD-10-CM | POA: Insufficient documentation

## 2021-06-25 DIAGNOSIS — C50919 Malignant neoplasm of unspecified site of unspecified female breast: Secondary | ICD-10-CM | POA: Diagnosis present

## 2021-06-25 DIAGNOSIS — C50512 Malignant neoplasm of lower-outer quadrant of left female breast: Secondary | ICD-10-CM | POA: Diagnosis not present

## 2021-06-25 DIAGNOSIS — E039 Hypothyroidism, unspecified: Secondary | ICD-10-CM | POA: Insufficient documentation

## 2021-06-25 DIAGNOSIS — Z79899 Other long term (current) drug therapy: Secondary | ICD-10-CM | POA: Insufficient documentation

## 2021-06-25 DIAGNOSIS — Z85828 Personal history of other malignant neoplasm of skin: Secondary | ICD-10-CM | POA: Diagnosis not present

## 2021-06-25 DIAGNOSIS — Z7189 Other specified counseling: Secondary | ICD-10-CM | POA: Insufficient documentation

## 2021-06-25 HISTORY — PX: MASTECTOMY W/ SENTINEL NODE BIOPSY: SHX2001

## 2021-06-25 SURGERY — MASTECTOMY WITH SENTINEL LYMPH NODE BIOPSY
Anesthesia: General | Site: Breast | Laterality: Left

## 2021-06-25 MED ORDER — SODIUM CHLORIDE 0.9 % IV SOLN: INTRAVENOUS | Status: DC

## 2021-06-25 MED ORDER — MAGNESIUM OXIDE -MG SUPPLEMENT 400 (240 MG) MG PO TABS
400.0000 mg | ORAL_TABLET | Freq: Every day | ORAL | Status: DC
  Administered 2021-06-26: 400 mg via ORAL
  Filled 2021-06-25: qty 1

## 2021-06-25 MED ORDER — DEXAMETHASONE SODIUM PHOSPHATE 10 MG/ML IJ SOLN
INTRAMUSCULAR | Status: DC | PRN
  Administered 2021-06-25: 5 mg via INTRAVENOUS

## 2021-06-25 MED ORDER — LIDOCAINE HCL (CARDIAC) PF 100 MG/5ML IV SOSY
PREFILLED_SYRINGE | INTRAVENOUS | Status: DC | PRN
  Administered 2021-06-25: 60 mg via INTRAVENOUS

## 2021-06-25 MED ORDER — BUPIVACAINE-EPINEPHRINE 0.5% -1:200000 IJ SOLN
INTRAMUSCULAR | Status: DC | PRN
  Administered 2021-06-25: 30 mL

## 2021-06-25 MED ORDER — FENTANYL CITRATE (PF) 100 MCG/2ML IJ SOLN
25.0000 ug | INTRAMUSCULAR | Status: DC | PRN
  Administered 2021-06-25 (×3): 25 ug via INTRAVENOUS

## 2021-06-25 MED ORDER — FENTANYL CITRATE (PF) 100 MCG/2ML IJ SOLN
INTRAMUSCULAR | Status: AC
  Filled 2021-06-25: qty 2

## 2021-06-25 MED ORDER — EPHEDRINE SULFATE 50 MG/ML IJ SOLN
INTRAMUSCULAR | Status: DC | PRN
  Administered 2021-06-25: 10 mg via INTRAVENOUS

## 2021-06-25 MED ORDER — HYDROCODONE-ACETAMINOPHEN 5-325 MG PO TABS
1.0000 | ORAL_TABLET | ORAL | Status: DC | PRN
  Administered 2021-06-25 – 2021-06-26 (×3): 1 via ORAL
  Filled 2021-06-25 (×2): qty 1

## 2021-06-25 MED ORDER — SILVER SULFADIAZINE 1 % EX CREA
1.0000 "application " | TOPICAL_CREAM | Freq: Two times a day (BID) | CUTANEOUS | Status: DC
  Administered 2021-06-25 – 2021-06-26 (×2): 1 via TOPICAL
  Filled 2021-06-25: qty 85

## 2021-06-25 MED ORDER — CEFAZOLIN SODIUM-DEXTROSE 2-4 GM/100ML-% IV SOLN
INTRAVENOUS | Status: AC
  Filled 2021-06-25: qty 100

## 2021-06-25 MED ORDER — LEVOTHYROXINE SODIUM 50 MCG PO TABS
75.0000 ug | ORAL_TABLET | Freq: Every day | ORAL | Status: DC
  Administered 2021-06-26: 75 ug via ORAL
  Filled 2021-06-25: qty 2

## 2021-06-25 MED ORDER — TECHNETIUM TC 99M TILMANOCEPT KIT
1.0300 | PACK | Freq: Once | INTRAVENOUS | Status: AC | PRN
  Administered 2021-06-25: 1.03 via INTRADERMAL

## 2021-06-25 MED ORDER — LACTATED RINGERS IV SOLN: INTRAVENOUS | Status: DC | PRN

## 2021-06-25 MED ORDER — FENTANYL CITRATE (PF) 100 MCG/2ML IJ SOLN
INTRAMUSCULAR | Status: DC | PRN
  Administered 2021-06-25 (×4): 50 ug via INTRAVENOUS

## 2021-06-25 MED ORDER — LACTATED RINGERS IV SOLN: INTRAVENOUS | Status: DC

## 2021-06-25 MED ORDER — CHLORHEXIDINE GLUCONATE 0.12 % MT SOLN
OROMUCOSAL | Status: AC
  Filled 2021-06-25: qty 15

## 2021-06-25 MED ORDER — ONDANSETRON HCL 4 MG/2ML IJ SOLN: 4.0000 mg | Freq: Four times a day (QID) | INTRAMUSCULAR | Status: DC | PRN

## 2021-06-25 MED ORDER — LATANOPROST 0.005 % OP SOLN
1.0000 [drp] | Freq: Every day | OPHTHALMIC | Status: DC
  Administered 2021-06-25: 1 [drp] via OPHTHALMIC
  Filled 2021-06-25 (×2): qty 2.5

## 2021-06-25 MED ORDER — LOSARTAN POTASSIUM 50 MG PO TABS
100.0000 mg | ORAL_TABLET | Freq: Every day | ORAL | Status: DC
  Administered 2021-06-25: 100 mg via ORAL
  Filled 2021-06-25: qty 2

## 2021-06-25 MED ORDER — ONDANSETRON HCL 4 MG/2ML IJ SOLN
INTRAMUSCULAR | Status: DC | PRN
  Administered 2021-06-25: 4 mg via INTRAVENOUS

## 2021-06-25 MED ORDER — CEFAZOLIN SODIUM-DEXTROSE 2-4 GM/100ML-% IV SOLN
2.0000 g | INTRAVENOUS | Status: AC
  Administered 2021-06-25: 2 g via INTRAVENOUS

## 2021-06-25 MED ORDER — BUPIVACAINE-EPINEPHRINE (PF) 0.5% -1:200000 IJ SOLN
INTRAMUSCULAR | Status: AC
  Filled 2021-06-25: qty 30

## 2021-06-25 MED ORDER — PHENYLEPHRINE HCL (PRESSORS) 10 MG/ML IV SOLN
INTRAVENOUS | Status: AC
  Filled 2021-06-25: qty 1

## 2021-06-25 MED ORDER — PANTOPRAZOLE SODIUM 40 MG IV SOLR
40.0000 mg | Freq: Every day | INTRAVENOUS | Status: DC
  Administered 2021-06-25: 40 mg via INTRAVENOUS
  Filled 2021-06-25: qty 40

## 2021-06-25 MED ORDER — FAMOTIDINE 20 MG PO TABS
20.0000 mg | ORAL_TABLET | Freq: Once | ORAL | Status: AC
  Administered 2021-06-25: 20 mg via ORAL

## 2021-06-25 MED ORDER — FENTANYL CITRATE (PF) 100 MCG/2ML IJ SOLN
INTRAMUSCULAR | Status: AC
  Administered 2021-06-25: 25 ug via INTRAVENOUS
  Filled 2021-06-25: qty 2

## 2021-06-25 MED ORDER — MAGNESIUM 400 MG PO TABS: 400.0000 mg | ORAL_TABLET | Freq: Every day | ORAL | Status: DC

## 2021-06-25 MED ORDER — FAMOTIDINE 20 MG PO TABS
ORAL_TABLET | ORAL | Status: AC
  Filled 2021-06-25: qty 1

## 2021-06-25 MED ORDER — CHLORHEXIDINE GLUCONATE 0.12 % MT SOLN
15.0000 mL | Freq: Once | OROMUCOSAL | Status: AC
  Administered 2021-06-25: 15 mL via OROMUCOSAL

## 2021-06-25 MED ORDER — TACROLIMUS 0.1 % EX OINT: TOPICAL_OINTMENT | Freq: Two times a day (BID) | CUTANEOUS | Status: DC

## 2021-06-25 MED ORDER — ACETAMINOPHEN 10 MG/ML IV SOLN
INTRAVENOUS | Status: DC | PRN
  Administered 2021-06-25: 1000 mg via INTRAVENOUS

## 2021-06-25 MED ORDER — BUPIVACAINE LIPOSOME 1.3 % IJ SUSP
INTRAMUSCULAR | Status: AC
  Filled 2021-06-25: qty 20

## 2021-06-25 MED ORDER — ENOXAPARIN SODIUM 40 MG/0.4ML IJ SOSY
40.0000 mg | PREFILLED_SYRINGE | INTRAMUSCULAR | Status: DC
  Administered 2021-06-26: 40 mg via SUBCUTANEOUS
  Filled 2021-06-25: qty 0.4

## 2021-06-25 MED ORDER — METHYLENE BLUE 0.5 % INJ SOLN
INTRAVENOUS | Status: AC
  Filled 2021-06-25: qty 10

## 2021-06-25 MED ORDER — MORPHINE SULFATE (PF) 4 MG/ML IV SOLN: 4.0000 mg | INTRAVENOUS | Status: DC | PRN

## 2021-06-25 MED ORDER — ACETAMINOPHEN 10 MG/ML IV SOLN
INTRAVENOUS | Status: AC
  Filled 2021-06-25: qty 100

## 2021-06-25 MED ORDER — ONDANSETRON 4 MG PO TBDP: 4.0000 mg | ORAL_TABLET | Freq: Four times a day (QID) | ORAL | Status: DC | PRN

## 2021-06-25 MED ORDER — ACETAMINOPHEN 325 MG PO TABS: 650.0000 mg | ORAL_TABLET | Freq: Four times a day (QID) | ORAL | Status: DC | PRN

## 2021-06-25 MED ORDER — BUPIVACAINE LIPOSOME 1.3 % IJ SUSP
INTRAMUSCULAR | Status: DC | PRN
  Administered 2021-06-25: 20 mL

## 2021-06-25 MED ORDER — ACETAMINOPHEN 650 MG RE SUPP
650.0000 mg | Freq: Four times a day (QID) | RECTAL | Status: DC | PRN
Start: 2021-06-25 — End: 2021-06-26

## 2021-06-25 MED ORDER — ALENDRONATE SODIUM 70 MG PO TABS: 70.0000 mg | ORAL_TABLET | ORAL | Status: DC

## 2021-06-25 MED ORDER — ONDANSETRON HCL 4 MG/2ML IJ SOLN: 4.0000 mg | Freq: Once | INTRAMUSCULAR | Status: DC | PRN

## 2021-06-25 MED ORDER — HYDROCODONE-ACETAMINOPHEN 5-325 MG PO TABS
ORAL_TABLET | ORAL | Status: AC
  Filled 2021-06-25: qty 1

## 2021-06-25 MED ORDER — ORAL CARE MOUTH RINSE: 15.0000 mL | Freq: Once | OROMUCOSAL | Status: AC

## 2021-06-25 MED ORDER — METHYLENE BLUE 0.5 % INJ SOLN
INTRAVENOUS | Status: DC | PRN
  Administered 2021-06-25: 7 mL via INTRADERMAL

## 2021-06-25 MED ORDER — PROPOFOL 10 MG/ML IV BOLUS
INTRAVENOUS | Status: DC | PRN
  Administered 2021-06-25: 140 mg via INTRAVENOUS

## 2021-06-25 SURGICAL SUPPLY — 38 items
BULB RESERV EVAC DRAIN JP 100C (MISCELLANEOUS) IMPLANT
CANISTER SUCT 1200ML W/VALVE (MISCELLANEOUS) IMPLANT
CHLORAPREP W/TINT 26 (MISCELLANEOUS) ×2 IMPLANT
DERMABOND ADVANCED (GAUZE/BANDAGES/DRESSINGS) ×1
DERMABOND ADVANCED .7 DNX12 (GAUZE/BANDAGES/DRESSINGS) ×1 IMPLANT
DRAIN CHANNEL JP 15F RND 16 (MISCELLANEOUS) IMPLANT
DRAPE LAPAROTOMY TRNSV 106X77 (MISCELLANEOUS) ×2 IMPLANT
DRSG GAUZE FLUFF 36X18 (GAUZE/BANDAGES/DRESSINGS) ×2 IMPLANT
ELECT REM PT RETURN 9FT ADLT (ELECTROSURGICAL) ×2
ELECTRODE REM PT RTRN 9FT ADLT (ELECTROSURGICAL) ×1 IMPLANT
GAUZE 4X4 16PLY ~~LOC~~+RFID DBL (SPONGE) ×2 IMPLANT
GAUZE SPONGE 4X4 12PLY STRL (GAUZE/BANDAGES/DRESSINGS) IMPLANT
GLOVE SURG ENC MOIS LTX SZ6.5 (GLOVE) ×6 IMPLANT
GLOVE SURG UNDER POLY LF SZ6.5 (GLOVE) ×6 IMPLANT
GOWN STRL REUS W/ TWL LRG LVL3 (GOWN DISPOSABLE) ×2 IMPLANT
GOWN STRL REUS W/TWL LRG LVL3 (GOWN DISPOSABLE) ×2
KIT TURNOVER KIT A (KITS) ×2 IMPLANT
LABEL OR SOLS (LABEL) ×2 IMPLANT
MANIFOLD NEPTUNE II (INSTRUMENTS) ×2 IMPLANT
MARGIN MAP 10MM (MISCELLANEOUS) IMPLANT
PACK BASIN MAJOR ARMC (MISCELLANEOUS) ×2 IMPLANT
PAD ABD DERMACEA PRESS 5X9 (GAUZE/BANDAGES/DRESSINGS) IMPLANT
SLEVE PROBE SENORX GAMMA FIND (MISCELLANEOUS) ×2 IMPLANT
SPONGE T-LAP 18X18 ~~LOC~~+RFID (SPONGE) ×6 IMPLANT
SUT ETHILON 3-0 FS-10 30 BLK (SUTURE) ×2
SUT ETHILON 4-0 (SUTURE)
SUT ETHILON 4-0 FS2 18XMFL BLK (SUTURE)
SUT MNCRL 4-0 (SUTURE) ×2
SUT MNCRL 4-0 27XMFL (SUTURE) ×2
SUT SILK 2 0 SH (SUTURE) IMPLANT
SUT SILK 3-0 (SUTURE) ×2 IMPLANT
SUT VIC AB 3-0 SH 27 (SUTURE) ×2
SUT VIC AB 3-0 SH 27X BRD (SUTURE) ×2 IMPLANT
SUT VIC AB 3-0 SH 8-18 (SUTURE) ×2 IMPLANT
SUT VICRYL+ 3-0 144IN (SUTURE) ×2 IMPLANT
SUTURE EHLN 3-0 FS-10 30 BLK (SUTURE) ×1 IMPLANT
SUTURE ETHLN 4-0 FS2 18XMF BLK (SUTURE) IMPLANT
SUTURE MNCRL 4-0 27XMF (SUTURE) ×2 IMPLANT

## 2021-06-25 NOTE — H&P (Signed)
HISTORY OF PRESENT ILLNESS:    Beth Rodgers is a 86 y.o.female patient who comes for surgical discussion of left breast extensive DCIS.   Patient with 2 areas of invasive lobular carcinoma s/p partial mastectomy.  During the partial mastectomy he had excision of the 2 invasive lobular areas and low suspicious areas in the supra areolar area.  The anterior and posterior margins of the partial mastectomy were positive for DCIS.  The suspicious area that was excisionally biopsied showed DCIS with positive margins.  The patient initially refused to have total mastectomy and wanted to proceed with medical management with antiestrogen therapy.  She went to [facility] for a second opinion.  At [facility] oncology she was started on antiestrogen therapy and it was changed to multiple alternatives due to side effects.  Last time they evaluated the patient she did not tolerated any of the antiestrogen alternatives and patient is not on any antiestrogen therapy at this moment.  Patient had a follow-up MRI that shows enhancement of the surgical border suspicious of residual disease.  Patient denies any pain in her breast.  There is no pain radiation.  There is no alleviating or aggravating factors.  I personally evaluated the images of the MRI.   During the last evaluation the patient still wanted to deal with some personal and family issues before proceeding with mastectomy.  The patient has finally decided that she needed to proceed with total mastectomy.  Patient denies any breast pain.  She denies any pain radiation.  She denies any alleviating or aggravating factors.      PAST MEDICAL HISTORY:      Past Medical History:  Diagnosis Date   Arthritis      osteoarthritis   Basal cell carcinoma     Glaucoma (increased eye pressure)     Thyroid disease      hypothyroid   Venous disease 06/11/2012    From Clinic Note5/07/2012 She underwent noninvasive venous testing revealing her to have anterior accessory and small saphenous  reflux bilaterally.  Her great saphenous veins are missing bilaterally due to surgical stripping, and she has reflux in her deep system as well.  Overall, given her pigmentation and changes consistent of longstanding chronic venous insufficiency, I would like to see if she        PAST SURGICAL HISTORY:        Past Surgical History:  Procedure Laterality Date   BREAST EXCISIONAL BIOPSY       EYE SURGERY Bilateral      cataract   Nasal reconstruction for basal cell carcinoma       Vein Stripping & Ligation             MEDICATIONS:        Outpatient Encounter Medications as of 05/23/2021  Medication Sig Dispense Refill   alendronate (FOSAMAX) 70 MG tablet Take by mouth once a week       ascorbic acid, vitamin C, 500 mg TbER Take 1 tablet by mouth       betamethasone dipropionate 0.05 % lotion betamethasone dipropionate 0.05 % lotion       betamethasone dipropionate, augmented, (DIPROLENE-AF) 0.05 % cream betamethasone, augmented 0.05 % topical cream       calcium carbonate (TUMS E-X) 300 mg (750 mg) chewable tablet Take by mouth as needed       calcium carbonate 600 mg calcium (1,500 mg) Tab tablet Take 1 tablet by mouth       cholecalciferol (VITAMIN D3) 1,250  mcg (50,000 unit) capsule Take 50,000 Units by mouth every 7 (seven) days       cholecalciferol (VITAMIN D3) 2,000 unit capsule Take 1 capsule by mouth once daily as needed       ciclopirox (LOPROX) 0.77 % cream APPLY 1 APPLICATION ON THE SKIN OF FEET TWICE A DAY       clobetasoL (CORMAX) 0.05 % external solution APPLY 1 ML TO THE SCALP TWICE A DAY       crisaborole (EUCRISA) 2 % Oint Apply topically as needed For legs       exemestane (AROMASIN) 25 mg tablet         fluocinolone (DERMA-SMOOTHE) 0.01 % external oil Apply topically       fluticasone propionate (CUTIVATE) 0.05 % cream Apply topically       halobetasol (ULTRAVATE) 0.05 % ointment halobetasol propionate 0.05 % topical ointment       levothyroxine (SYNTHROID) 75 MCG  tablet Take 1 tablet by mouth once daily       losartan (COZAAR) 100 MG tablet Take 1 tablet by mouth once daily       multivitamin tablet Take by mouth.       nitroGLYcerin (NITROSTAT) 0.4 MG SL tablet Reported on 12/17/2015       silver sulfADIAZINE (SSD) 1 % cream Apply topically as needed       tacrolimus (PROTOPIC) 0.1 % ointment Apply topically 2 (two) times daily       tamoxifen (NOLVADEX) 20 MG tablet         travoprost (TRAVATAN Z) 0.004 % Ophth ophthalmic solution Apply 1 drop to eye nightly       celecoxib (CELEBREX) 200 MG capsule 1 capsule as needed (Patient not taking: Reported on 05/23/2021)       lidocaine-prilocaine (EMLA) cream Apply topically once for 1 dose Apply around the nipple areola area one hour before the procedure. 5 g 0    No facility-administered encounter medications on file as of 05/23/2021.      ALLERGIES:   Naproxen   SOCIAL HISTORY:  Social History        Socioeconomic History   Marital status: Married  Tobacco Use   Smoking status: Never Smoker   Smokeless tobacco: Never Used  Scientific laboratory technician Use: Never used      FAMILY HISTORY:  No family history on file.   GENERAL REVIEW OF SYSTEMS:    General ROS: negative for - chills, fatigue, fever, weight gain or weight loss Allergy and Immunology ROS: negative for - hives Hematological and Lymphatic ROS: negative for - bleeding problems or bruising, negative for palpable nodes Endocrine ROS: negative for - heat or cold intolerance, hair changes Respiratory ROS: negative for - cough, shortness of breath or wheezing Cardiovascular ROS: no chest pain or palpitations GI ROS: negative for nausea, vomiting, abdominal pain, diarrhea, constipation Musculoskeletal ROS: negative for - muscle pain.  Positive for knee pain Neurological ROS: negative for - confusion, syncope Dermatological ROS: negative for pruritus and rash   PHYSICAL EXAM:     Vitals:    05/23/21 0909  BP: 129/80  Pulse: 88   . Ht:167.6 cm (5\' 6" ) Wt:78.9 kg (174 lb) ZOX:WRUE surface area is 1.92 meters squared. Body mass index is 28.08 kg/m.Marland Kitchen   GENERAL: Alert, active, oriented x3   HEENT: Pupils equal reactive to light. Extraocular movements are intact. Sclera clear. Palpebral conjunctiva normal red color.Pharynx clear.   NECK: Supple with no palpable mass and  no adenopathy.   LUNGS: Sound clear with no rales rhonchi or wheezes.   HEART: Regular rhythm S1 and S2 without murmur.   BREAST: Both breast were examined in the sitting and supine position there was no palpable masses, no skin changes, no nipple retraction, no nipple discharge.  The breasts are well-healed.  No axillary adenopathy bilaterally.   ABDOMEN: Soft and depressible, nontender with no palpable mass, no hepatomegaly.   EXTREMITIES: Well-developed well-nourished symmetrical with no dependent edema.   NEUROLOGICAL: Awake alert oriented, facial expression symmetrical, moving all extremities.      IMPRESSION:     Malignant neoplasm of lower-outer quadrant of left female breast, unspecified estrogen receptor status (CMS-HCC) [C50.512] -S/p partial mastectomy and sentinel biopsy on 09/11/2020 -Multiple positive margins to DCIS.  No posterior margin to invasive carcinoma -Postoperative seroma resolved -Patient has been on 3 type of antiestrogen therapy.    She has not tolerated any type of antiestrogen therapy.  Patient is currently off antiestrogen therapy. -I had a long discussion with the patient that since she did not tolerated antiestrogen therapy my recommendation is to proceed with total mastectomy.  She now agrees to proceed with left total mastectomy.  I have discussed with the patient the surgical procedure and risk of surgery includes bleeding, infection, seroma, pain, swelling of the upper extremity.  Risk of anesthesia that includes cardia complications, pneumonia, DVT, stroke, among others. The patient reports that she understood  and agreed to proceed.      PLAN:  Left total mastectomy and sentinel lymph node biopsy ([id number], [id number]) CBC, CMP Avoid taking aspirin 5 days before surgery Contact us if you have any concern.   Patient verbalized understanding, all questions were answered, and were agreeable with the plan outlined above.   [name], MD   Electronically signed by [name], MD

## 2021-06-25 NOTE — Op Note (Signed)
Preoperative diagnosis: Carcinoma of the left breast.  Postoperative diagnosis: Carcinoma of the left breast..   Procedure: Left total mastectomy.                     Sentinel Lymph node biopsy  Anesthesia: GETA  Surgeon: Dr. Windell Moment  Wound Classification: Clean  Indications: Patient is a 86 y.o. female who had partial mastectomy and was found with multicentric disease of the breast with extensive DCIS. Total mastectomy was indicated for oncologic treatment of left breast cancer.   Findings: No gross pathology identified.  Two sentinel lymph node identified.   Description of procedure: The patient was brought to the operating room and general anesthesia was induced. A time-out was completed verifying correct patient, procedure, site, positioning, and implant(s) and/or special equipment prior to beginning this procedure. I personally injected blue dye around the areola complex for intra operative identification of lymph nodes. The left breast, chest wall, axilla, and upper arm and neck were prepped and draped in the usual sterile fashion.  A skin incision was made that encompassed the nipple-areola complex and the previous biopsy scar and passed in an oblique direction across the breast. Flaps were raised in the avascular plane between subcutaneous tissue and breast tissue from the clavicle superiorly, the sternum medially, the anterior rectus sheath inferiorly, and past the lateral border of the pectoralis major muscle laterally. Hemostasis was achieved in the flaps. Next, the breast tissue and underlying pectoralis fascia were excised from the pectoralis major muscle, progressing from medially to laterally. At the lateral border of the pectoralis major muscle, the breast tissue was swung laterally and a lateral pedicle identified where breast tissue gave way to fat of axilla. The lateral pedicle was incised and the specimen removed.  A hand-held gamma probe was used to identify the  location of the hottest spot in the axilla. An incision was made around the caudal axillary hairline. Dissection was carried down until subdermal facias was advanced. The probe was placed and again, the point of maximal count was found. Dissection continue until nodule was identified. The probe was placed in contact with the node. The node was excised in its entirety.  An additional hot spot was detected and the node was excised in similar fashion. No additional hot spots were identified. No clinically abnormal nodes were palpated.   The wound was irrigated and hemostasis was achieved. Closed suction drains were brought into the operative field through a separate stab incision and sutured to the skin with a 3-0 nylon suture. The wound was closed with interrupted 3-0 Vicryl to the subcutaneous layer, followed by a subcuticular layer of Monocryl 4-0. The wound was dressed.  The patient tolerated the procedure well and was taken to the postanesthesia care unit in stable condition.     Sentinel Node Biopsy Synoptic Operative Report  Operation performed with curative intent:Yes  Tracer(s) used to identify sentinel nodes in the upfront surgery (non-neoadjuvant) setting (select all that apply):Dye and Radioactive Tracer  Tracer(s) used to identify sentinel nodes in the neoadjuvant setting (select all that apply):N/A  All nodes (colored or non-colored) present at the end of a dye-filled lymphatic channel were removed:Yes   All significantly radioactive nodes were removed:Yes  All palpable suspicious nodes were removed:N/A  Biopsy-proven positive nodes marked with clips prior to chemotherapy were identified and removed:N/A  Specimen: Left Breast                    Sentinel  lymph nodes #1, #2.   Complications: None  Estimated Blood Loss: 50 mL

## 2021-06-25 NOTE — Transfer of Care (Signed)
Immediate Anesthesia Transfer of Care Note  Patient: Beth Rodgers  Procedure(s) Performed: MASTECTOMY WITH SENTINEL LYMPH NODE BIOPSY (Left: Breast)  Patient Location: PACU  Anesthesia Type:General  Level of Consciousness: sedated  Airway & Oxygen Therapy: Patient Spontanous Breathing and Patient connected to face mask oxygen  Post-op Assessment: Report given to RN and Post -op Vital signs reviewed and stable  Post vital signs: Reviewed and stable  Last Vitals:  Vitals Value Taken Time  BP 136/66 06/25/21 1210  Temp    Pulse 80 06/25/21 1212  Resp 29 06/25/21 1212  SpO2 97 % 06/25/21 1212  Vitals shown include unvalidated device data.  Last Pain:  Vitals:   06/25/21 0904  TempSrc: Oral  PainSc: 0-No pain         Complications: No notable events documented.

## 2021-06-25 NOTE — Progress Notes (Signed)
Patient requesting that IVF be stopped; Eating/drinking well; minimal pain and no nausea; Secure chat sent to on-call MD, Dr. Lysle Pearl; New order written. Barbaraann Faster, RN ;06/25/2021; 8:44 PM

## 2021-06-25 NOTE — Anesthesia Procedure Notes (Signed)
Procedure Name: LMA Insertion Date/Time: 06/25/2021 9:45 AM Performed by: Justus Memory, CRNA Pre-anesthesia Checklist: Patient identified, Patient being monitored, Timeout performed, Emergency Drugs available and Suction available Patient Re-evaluated:Patient Re-evaluated prior to induction Oxygen Delivery Method: Circle system utilized Preoxygenation: Pre-oxygenation with 100% oxygen Induction Type: IV induction Ventilation: Mask ventilation without difficulty LMA: LMA inserted LMA Size: 3.5 Tube type: Oral Number of attempts: 1 Placement Confirmation: positive ETCO2 and breath sounds checked- equal and bilateral Tube secured with: Tape Dental Injury: Teeth and Oropharynx as per pre-operative assessment

## 2021-06-25 NOTE — Anesthesia Preprocedure Evaluation (Signed)
Anesthesia Evaluation  Patient identified by MRN, date of birth, ID band Patient awake    Reviewed: Allergy & Precautions, H&P , NPO status , Patient's Chart, lab work & pertinent test results, reviewed documented beta blocker date and time   Airway Mallampati: II  TM Distance: >3 FB Neck ROM: full    Dental  (+) Teeth Intact   Pulmonary neg pulmonary ROS,    Pulmonary exam normal        Cardiovascular hypertension, On Medications negative cardio ROS Normal cardiovascular exam Rhythm:regular Rate:Normal     Neuro/Psych PSYCHIATRIC DISORDERS Depression negative neurological ROS     GI/Hepatic Neg liver ROS, GERD  Medicated,  Endo/Other  Hypothyroidism   Renal/GU negative Renal ROS  negative genitourinary   Musculoskeletal   Abdominal   Peds  Hematology negative hematology ROS (+)   Anesthesia Other Findings Past Medical History: No date: Basal cell carcinoma (BCC)     Comment:  txted with MOHs in past 08/10/2018: Cellulitis No date: Chronic venous insufficiency     Comment:  with varicose veins No date: Family history of breast cancer No date: Family history of thyroid cancer No date: GERD (gastroesophageal reflux disease) No date: Glaucoma No date: History  of basal cell carcinoma     Comment:  left nare/BREAST CANCER No date: History of chicken pox No date: Hypertension No date: Hypoglycemia No date: Hypothyroid No date: Osteoarthritis     Comment:  back pain, L knee pain 06/2009: Osteopenia     Comment:  DEXA 11/2015: T -2.3 hip, -2.2 spine, on longterm               bisphosphonate/evista No date: Psoriasis 04/16/2016: Pyogenic granuloma Past Surgical History: No date: BASAL CELL CARCINOMA EXCISION     Comment:  located on face No date: BREAST BIOPSY; Left     Comment:  core done ing [place]? 08/16/2020: BREAST BIOPSY; Left     Comment:  3 area bx 4:00 X, IMC, 6:00Q IMC, LN hydro  #3-benign 09/11/2020: BREAST LUMPECTOMY; Left     Comment:  two areas of [place] No date: CATARACT EXTRACTION     Comment:  bilateral 06/2009: DEXA     Comment:  T score Spine: -1.8, hip -2.0 09/11/2020: EXCISION OF BREAST BIOPSY; Left     Comment:  Procedure: EXCISION OF BREAST BIOPSY;  Surgeon:               [name], MD;  Location: [facility] ORS;  Service:              General;  Laterality: Left; 09/2015: Foam sclerotherapy; Bilateral     Comment:  Reed Pandy, [place] 2005: NASAL RECONSTRUCTION     Comment:  for [place] L nare s/p Mohs 2014: nuclear stress test     Comment:  normal in [place] 09/11/2020: PARTIAL MASTECTOMY WITH NEEDLE LOCALIZATION AND AXILLARY  SENTINEL LYMPH NODE BX; Left     Comment:  Procedure: PARTIAL MASTECTOMY WITH NEEDLE LOCALIZATION               AND AXILLARY SENTINEL LYMPH NODE BX;  Surgeon:               [name], MD;  Location: [facility] ORS;  Service:              General;  Laterality: Left; 01/2012: RADIOFREQUENCY ABLATION; Left     Comment:  remnant L G saphenous vein below knee 02/2013: RADIOFREQUENCY ABLATION; Right     Comment:  RLE vein ablation  remote: VEIN LIGATION AND STRIPPING     Comment:  bilateral G saphenous veins   Reproductive/Obstetrics negative OB ROS                             Anesthesia Physical Anesthesia Plan  ASA: 2  Anesthesia Plan: General ETT   Post-op Pain Management:    Induction:   PONV Risk Score and Plan: 4 or greater  Airway Management Planned:   Additional Equipment:   Intra-op Plan:   Post-operative Plan:   Informed Consent: I have reviewed the patients History and Physical, chart, labs and discussed the procedure including the risks, benefits and alternatives for the proposed anesthesia with the patient or authorized representative who has indicated his/her understanding and acceptance.     Dental Advisory Given  Plan Discussed with: CRNA  Anesthesia Plan  Comments:         Anesthesia Quick Evaluation

## 2021-06-25 NOTE — Progress Notes (Signed)
Hematology/Oncology Consult note [facility]  Telephone:([phone] [phone]  Patient Care Team: Ria Bush, MD as PCP - General (Family Medicine) Rico Junker, RN as Registered Nurse   Name of the patient: Beth Rodgers  [id number]  09-Aug-1935   Date of visit: 06/25/21  Diagnosis-pathological prognostic stage Ia invasive mammary carcinoma of the left breast pT1 cpN0 cM0 ER/PR positive HER2 negative  Chief complaint/ Reason for visit-transfer of care from Dr. Tasia Catchings  Heme/Onc history: Patient is a 86 year old female who self palpated a left breast mass in August 2021.  Diagnostic mammogram showed 2 adjacent masses at 4:00 and 6 o'clock position with interspersed calcifications in between spanning 4.5 cm.  Left axillary lymph nodes appeared normal.  Left breast biopsy in both 4 and 6 o'clock position showed invasive mammary carcinoma grade 1  Patient was seen by Dr. Peyton Najjar and underwent left lumpectomy with sentinel lymph node biopsy.  Final pathology showed multifocal invasive mammary carcinoma with high-grade DCIS.  1 sentinel lymph node negative for malignancy.  DCIS also noted in the left retroareolar excisional biopsy.  Tumor size 19 mm grade 1.  Lymphovascular invasion negative.  Margins were negative for invasive carcinoma but positive for DCIS posterior superior.  ER/PR positive and HER2 equivocal by IHC and negative by [place] group 5.  Given the concern for residual multifocal DCIS and positive margin for DCIS she was recommended to undergo mastectomy but chose to hold off on getting it done in September 2021.  She has now met with Dr. Peyton Najjar again and will be undergoing left mastectomy on 06/25/2021.  Oncotype DX testing was offered in September 2021 as well and patient declined.  Patient is not currently on endocrine therapy.  Interval history-patient is doing well currently and denies any specific complaints at this time.  She remains  independent of her ADLs.  ECOG PS- 1 Pain scale- 0  Review of systems- Review of Systems  Constitutional:  Negative for chills, fever, malaise/fatigue and weight loss.  HENT:  Negative for congestion, ear discharge and nosebleeds.   Eyes:  Negative for blurred vision.  Respiratory:  Negative for cough, hemoptysis, sputum production, shortness of breath and wheezing.   Cardiovascular:  Negative for chest pain, palpitations, orthopnea and claudication.  Gastrointestinal:  Negative for abdominal pain, blood in stool, constipation, diarrhea, heartburn, melena, nausea and vomiting.  Genitourinary:  Negative for dysuria, flank pain, frequency, hematuria and urgency.  Musculoskeletal:  Negative for back pain, joint pain and myalgias.  Skin:  Negative for rash.  Neurological:  Negative for dizziness, tingling, focal weakness, seizures, weakness and headaches.  Endo/Heme/Allergies:  Does not bruise/bleed easily.  Psychiatric/Behavioral:  Negative for depression and suicidal ideas. The patient does not have insomnia.       Allergies  Allergen Reactions   Naproxen Hives    Had bumps break out on the back    Anastrozole Other (See Comments)    Back pain    Exemestane     Pain in right hand      Past Medical History:  Diagnosis Date   Basal cell carcinoma (BCC)    txted with MOHs in past   Cellulitis 08/10/2018   Chronic venous insufficiency    with varicose veins   Family history of breast cancer    Family history of thyroid cancer    GERD (gastroesophageal reflux disease)    Glaucoma    History  of basal cell carcinoma    left nare/BREAST  CANCER   History of chicken pox    Hypertension    Hypoglycemia    Hypothyroid    Osteoarthritis    back pain, L knee pain   Osteopenia 06/2009   DEXA 11/2015: T -2.3 hip, -2.2 spine, on longterm bisphosphonate/evista   Psoriasis    Pyogenic granuloma 04/16/2016     Past Surgical History:  Procedure Laterality Date   BASAL CELL  CARCINOMA EXCISION     located on face   BREAST BIOPSY Left    core done ing [place]?   BREAST BIOPSY Left 08/16/2020   3 area bx 4:00 X, IMC, 6:00Q IMC, LN hydro #3-benign   BREAST LUMPECTOMY Left 09/11/2020   two areas of [facility]   CATARACT EXTRACTION     bilateral   DEXA  06/2009   T score Spine: -1.8, hip -2.0   EXCISION OF BREAST BIOPSY Left 09/11/2020   Procedure: EXCISION OF BREAST BIOPSY;  Surgeon: [name], MD;  Location: [facility] ORS;  Service: General;  Laterality: Left;   Foam sclerotherapy Bilateral 09/2015   Reed Pandy, [place]   NASAL RECONSTRUCTION  2005   for [facility] L nare s/p Mohs   nuclear stress test  2014   normal in [place] LYMPH NODE BX Left 09/11/2020   Procedure: PARTIAL MASTECTOMY WITH NEEDLE LOCALIZATION AND AXILLARY SENTINEL LYMPH NODE BX;  Surgeon: [name], MD;  Location: [facility] ORS;  Service: General;  Laterality: Left;   RADIOFREQUENCY ABLATION Left 01/2012   remnant L G saphenous vein below knee   RADIOFREQUENCY ABLATION Right 02/2013   RLE vein ablation    VEIN LIGATION AND STRIPPING  remote   bilateral G saphenous veins    Social History   Socioeconomic History   Marital status: Married    Spouse name: Not on file   Number of children: 2   Years of education: Not on file   Highest education level: Not on file  Occupational History    Employer: RETIRED  Tobacco Use   Smoking status: Never   Smokeless tobacco: Never  Vaping Use   Vaping Use: Never used  Substance and Sexual Activity   Alcohol use: Yes    Comment: Occasionally drinks wine   Drug use: No   Sexual activity: Not Currently    Birth control/protection: Post-menopausal  Other Topics Concern   Not on file  Social History Narrative   From [place]   Caffeine: 2-3 cups/day   Lives with husband, majority of time alone as he travels to [place].   Has grandchildren nearby.   Occ:  Field seismologist, Metallurgist   Activity: bed exercises, limiting walking   Diet: does get fruits and vegetables, only some water   Social Determinants of Radio broadcast assistant Strain: Low Risk    Difficulty of Paying Living Expenses: Not hard at all  Food Insecurity: No Food Insecurity   Worried About Charity fundraiser in the Last Year: Never true   Arboriculturist in the Last Year: Never true  Transportation Needs: No Transportation Needs   Lack of Transportation (Medical): No   Lack of Transportation (Non-Medical): No  Physical Activity: Sufficiently Active   Days of Exercise per Week: 7 days   Minutes of Exercise per Session: 30 min  Stress: No Stress Concern Present   Feeling of Stress : Not at all  Social Connections: Not on file  Intimate Partner Violence: Not  At Risk   Fear of Current or Ex-Partner: No   Emotionally Abused: No   Physically Abused: No   Sexually Abused: No    Family History  Problem Relation Age of Onset   Cancer Mother        thyroid cancer   Heart disease Father        CHF   Diabetes Father    Glaucoma Father    Arthritis Father    Coronary artery disease Maternal Uncle    Bipolar disorder Son    Heart disease Sister        CHF   Stroke Sister    Brain cancer Grandson        astrocytoma   Breast cancer Cousin        dx 68s    No current facility-administered medications for this visit. No current outpatient medications on file.  Facility-Administered Medications Ordered in Other Visits:    ceFAZolin (ANCEF) 2-4 GM/100ML-% IVPB, , , ,    ceFAZolin (ANCEF) IVPB 2g/100 mL premix, 2 g, Intravenous, On Call to OR, [name], MD   chlorhexidine (PERIDEX) 0.12 % solution 15 mL, 15 mL, Mouth/Throat, Once **OR** MEDLINE mouth rinse, 15 mL, Mouth Rinse, Once, Arita Miss, MD   chlorhexidine (PERIDEX) 0.12 % solution, , , ,    famotidine (PEPCID) 20 MG tablet, , , ,    famotidine (PEPCID) tablet 20 mg, 20 mg, Oral, Once,  Karen Kitchens, NP   lactated ringers infusion, , Intravenous, Continuous, Arita Miss, MD  Physical exam:  Vitals:   06/20/21 1513  BP: (!) 148/88  Pulse: 85  Temp: 98.9 F (37.2 C)  SpO2: 98%  Weight: 171 lb (77.6 kg)   Physical Exam Cardiovascular:     Rate and Rhythm: Normal rate and regular rhythm.     Heart sounds: Normal heart sounds.  Pulmonary:     Effort: Pulmonary effort is normal.     Breath sounds: Normal breath sounds.  Abdominal:     General: Bowel sounds are normal.     Palpations: Abdomen is soft.  Skin:    General: Skin is warm and dry.  Neurological:     Mental Status: She is alert and oriented to person, place, and time.   Breast exam was performed in seated and lying down position. Patient is status post left lumpectomy with a well-healed surgical scar. No evidence of any palpable masses. No evidence of axillary adenopathy. No evidence of any palpable masses or lumps in the right breast. No evidence of right axillary adenopathy   CMP Latest Ref Rng & Units 06/02/2021  Glucose 70 - 99 mg/dL 82  BUN 6 - 23 mg/dL 19  Creatinine 0.40 - 1.20 mg/dL 0.72  Sodium 135 - 145 mEq/L 139  Potassium 3.5 - 5.1 mEq/L 4.3  Chloride 96 - 112 mEq/L 102  CO2 19 - 32 mEq/L 27  Calcium 8.4 - 10.5 mg/dL 9.8  Total Protein 6.5 - 8.1 g/dL -  Total Bilirubin 0.3 - 1.2 mg/dL -  Alkaline Phos 38 - 126 U/L -  AST 15 - 41 U/L -  ALT 0 - 44 U/L -   CBC Latest Ref Rng & Units 06/02/2021  WBC 4.0 - 10.5 K/uL 6.3  Hemoglobin 12.0 - 15.0 g/dL 13.9  Hematocrit 36.0 - 46.0 % 40.9  Platelets 150.0 - 400.0 K/uL 230.0    No images are attached to the encounter.  DG Chest 2 View  Result Date: 06/02/2021 CLINICAL  DATA:  Preop eval EXAM: CHEST - 2 VIEW COMPARISON:  Chest CT 01/04/2021 FINDINGS: The cardiac silhouette is within normal limits. Mild tortuosity of the thoracic aorta with known enlarged ascending aorta as seen on prior CT in January 2022. There is no focal airspace disease.  There is no pleural effusion or visible pneumothorax. There is no acute osseous abnormality. Thoracic spondylosis. IMPRESSION: No evidence of acute cardiopulmonary disease. Electronically Signed   By: Maurine Simmering   On: 06/02/2021 17:01   DG Knee AP/LAT W/[place] Left  Result Date: 06/02/2021 CLINICAL DATA:  Chronic left knee pain EXAM: LEFT KNEE 3 VIEWS COMPARISON:  None. FINDINGS: There is tricompartment osteophyte formation with moderate lateral and severe patellofemoral joint space narrowing. Small joint effusion. No evidence of acute fracture. IMPRESSION: Tricompartment osteoarthritis of the left knee, with moderate lateral and severe patellofemoral joint space narrowing. Small effusion. Electronically Signed   By: Maurine Simmering   On: 06/02/2021 16:59     Assessment and plan- Patient is a 86 y.o. female with stage Ia invasive mammary carcinoma of the left breast pT1 cN0 M0 ER/PR positive HER2 negative status postlumpectomy and sentinel lymph node biopsy  Discussed the results of final pathology from September 2021 which showed a 19 mm grade 1 invasive mammary carcinoma.  1 sentinel lymph node negative for malignancy.  Margin was negative for invasive carcinoma but positive for DCIS multifocal.  Given the concern for residual DCIS-reexcision surgery for positive margins and need for mastectomy if repeat margins are positive versus mastectomy was discussed with the patient.  She deferred surgery back then and has decided to proceed with left mastectomy without reconstruction with Dr. Peyton Najjar at this time and will be undergoing that on 06/25/2021.  I will see the patient roughly 1 month from now to discuss final pathology results.  Discussed there would be no need for adjuvant radiation treatment following mastectomy.  Patient has baseline osteoporosis and is currently on Fosamax.  She is not on any endocrine therapy at this time.  She has tried anastrozole in the past in November 2021 discontinued due to  arthralgias.  Restarted exemestane and again discontinued due to arthralgias.  Was suggested tamoxifen at [facility] but declined due to concern for side effects.  I will discuss if she would be open to considering tamoxifen in the future versus staying off hormone therapy when I see her next time.  Cancer Staging Malignant neoplasm of overlapping sites of left breast in female, estrogen receptor positive ([place]) Staging form: Breast, AJCC 8th Edition - Pathologic stage from 06/20/2021: Stage IA (pT1c, pN0, cM0, G1, ER+, PR+, HER2-) - Signed by Sindy Guadeloupe, MD on 06/25/2021 Stage prefix: Initial diagnosis Multigene prognostic tests performed: None Histologic grading system: 3 grade system Menopausal status: Postmenopausal    Visit Diagnosis 1. Malignant neoplasm of overlapping sites of left breast in female, estrogen receptor positive ([place])   2. Goals of care, counseling/discussion      Dr. Randa Evens, MD, MPH [facility] [phone] 06/25/2021 8:21 AM

## 2021-06-26 ENCOUNTER — Encounter: Payer: Self-pay | Admitting: General Surgery

## 2021-06-26 DIAGNOSIS — Z79899 Other long term (current) drug therapy: Secondary | ICD-10-CM | POA: Diagnosis not present

## 2021-06-26 DIAGNOSIS — D0512 Intraductal carcinoma in situ of left breast: Secondary | ICD-10-CM | POA: Diagnosis not present

## 2021-06-26 DIAGNOSIS — Z85828 Personal history of other malignant neoplasm of skin: Secondary | ICD-10-CM | POA: Diagnosis not present

## 2021-06-26 DIAGNOSIS — E039 Hypothyroidism, unspecified: Secondary | ICD-10-CM | POA: Diagnosis not present

## 2021-06-26 MED ORDER — HYDROCODONE-ACETAMINOPHEN 5-325 MG PO TABS: 1.0000 | ORAL_TABLET | ORAL | 0 refills | Status: AC | PRN

## 2021-06-26 NOTE — TOC Initial Note (Signed)
Transition of Care [facility]) - Initial/Assessment Note    Patient Details  Name: Beth Rodgers MRN: [id number] Date of Birth: [birth year]  [facility] CM/SW Contact:    Beverly Sessions, RN Phone Number: 06/26/2021, 2:03 PM  Clinical Narrative:                 Patient to discharge today Patient states she lives at home with husband Husband will be transporting at discharge  Patient will discharge with JP in place.  MD has done drain care at bedside.   Outpatient office has set up home health services with [place] home [facility].  Joelene Millin with [facility] notified of discharge. Per Joelene Millin she does not need orders at discharge    Expected Discharge Plan: [place] Barriers to Discharge: No Barriers Identified   Patient Goals and CMS Choice        Expected Discharge Plan and Services Expected Discharge Plan: [place]   Discharge Planning Services: CM Consult   Living arrangements for the past 2 months: Single Family Home Expected Discharge Date: 06/26/21                         HH Arranged: RN HH Agency: [place] Date [facility] Agency Contacted: 06/26/21   Representative spoke with at [place]: Joelene Millin  Prior Living Arrangements/Services Living arrangements for the past 2 months: [place] Lives with:: Spouse Patient language and need for interpreter reviewed:: Yes Do you feel safe going back to the place where you live?: Yes      Need for Family Participation in Patient Care: Yes (Comment) Care giver support system in place?: Yes (comment)   Criminal Activity/Legal Involvement Pertinent to Current Situation/Hospitalization: No - Comment as needed  Activities of Daily Living Home Assistive Devices/Equipment: Cane (specify quad or straight) (Straight) ADL Screening (condition at time of admission) Patient's cognitive ability adequate to safely complete daily activities?: Yes Is the patient deaf or have  difficulty hearing?: Yes Does the patient have difficulty seeing, even when wearing glasses/contacts?: No Does the patient have difficulty concentrating, remembering, or making decisions?: No Patient able to express need for assistance with ADLs?: Yes Does the patient have difficulty dressing or bathing?: No Independently performs ADLs?: Yes (appropriate for developmental age) Does the patient have difficulty walking or climbing stairs?: No Weakness of Legs: None Weakness of Arms/Hands: None  Permission Sought/Granted                  Emotional Assessment Appearance:: Appears older than stated age     Orientation: : Oriented to Self, Oriented to Place, Oriented to  Time, Oriented to Situation Alcohol / Substance Use: Not Applicable Psych Involvement: No (comment)  Admission diagnosis:  Breast cancer ([place]) [C50.919] Patient Active Problem List   Diagnosis Date Noted   Goals of care, counseling/discussion 06/25/2021   Breast cancer ([place]) 06/25/2021   Pre-op evaluation 06/02/2021   Right wrist pain 02/28/2021   Skin rash 02/28/2021   Ascending aorta dilatation ([place]) 01/10/2021   Left-sided chest pain 01/10/2021   Genetic testing 10/30/2020   Malignant neoplasm of overlapping sites of left breast in female, estrogen receptor positive ([place]) 10/22/2020   Family history of breast cancer    Family history of thyroid cancer    Insertional Achilles tendinopathy 08/28/2020   Breast cancer, left breast ([place]) 08/01/2020   Right carotid bruit 06/05/2020   Chest pressure 06/03/2020  Left Achilles tendinitis 09/01/2019   Low back pain 08/29/2019   Lumbosacral spondylosis without myelopathy 08/29/2019   Calcaneal spur 08/17/2019   Contusion of knee 10/04/2018   Varicose veins of bilateral lower extremities with pain 03/30/2018   Lymphedema 03/30/2018   Dizziness 02/02/2017   Situational depression 12/18/2016   Vitamin D deficiency 10/11/2016   Fatigue 08/12/2016   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 04/14/2016   Essential hypertension 12/17/2015   Dermatitis of external ear 12/17/2015   [place] of foot 05/16/2015   Advanced care planning/counseling discussion 10/30/2014   Neck pain on right side 06/25/2014   Primary osteoarthritis of left knee 08/17/2013   Oropharyngeal dysphagia 11/15/2012   Stressful life events affecting family and household 03/30/2012   Medicare annual wellness visit, subsequent 03/30/2012   History of basal cell carcinoma    Osteoarthritis    Osteopenia    Hypothyroidism    Glaucoma    Chronic venous insufficiency    PCP:  Ria Bush, MD Pharmacy:   [id number] [place], [place] - [address] [place] [address] Phone: [phone] Fax: [phone]  [id number] [place], [place], AT [facility] [place], [place] [address] Phone: [phone] Fax: [phone]  [id number] [place] - [address] AT [place] [address] Phone: [phone] Fax: [phone]  [place], [place] [address] [address] Phone: [phone] Fax: [phone]  [facility] [id number], [place] - [address] [place] [address] Phone: [phone] Fax: [phone]     Social Determinants of Health (SDOH) Interventions    Readmission Risk Interventions No flowsheet data found.

## 2021-06-26 NOTE — Discharge Instructions (Signed)
  Diet: Resume home heart healthy regular diet.   Activity: No heavy lifting >20 pounds (children, pets, laundry, garbage) or strenuous activity until follow-up, but light activity and walking are encouraged. Do not drive or drink alcohol if taking narcotic pain medications.  Wound care: May shower with soapy water and pat dry (do not rub incisions), but no baths or submerging incision underwater until follow-up. (no swimming)   Empty drain two times per day and chart output.   Medications: Resume all home medications. For mild to moderate pain: acetaminophen (Tylenol) or ibuprofen (if no kidney disease). Combining Tylenol with alcohol can substantially increase your risk of causing liver disease. Narcotic pain medications, if prescribed, can be used for severe pain, though may cause nausea, constipation, and drowsiness. Do not combine Tylenol and Norco within a 6 hour period as Norco contains Tylenol. If you do not need the narcotic pain medication, you do not need to fill the prescription.  Call office [phone]) at any time if any questions, worsening pain, fevers/chills, bleeding, drainage from incision site, or other concerns.

## 2021-06-26 NOTE — Care Management Obs Status (Signed)
[place] NOTIFICATION   Patient Details  Name: Beth Rodgers MRN: [id number] Date of Birth: Mar 26, 1935   Medicare Observation Status Notification Given:  Yes    Beverly Sessions, RN 06/26/2021, 2:07 PM

## 2021-06-26 NOTE — Progress Notes (Signed)
Patient requested new surgi-bra, old one with bloody drainage. Extra 4X4's added to dsg. OR called for new surgi-bra, medium applied, but patient stated that it was "too tight"; Large ordered from OR; patient became upset that it was "taking too long"; ACE wrap applied to keep pressure and cover incision, while waiting for new Surgi-bra to arrive; patient expressed concern that her incision would get an infection..."like the last time, I had an infection for over a month...and I'm afraid that it will happen again." Assured patient that we would keep her incision covered with gauze and the ACE wrap. Norco effective for pain control. Patient appeared to accept reassurance; calmed down and agreed to try to get some sleep. Barbaraann Faster, RN negative5:31 AM 06/26/2021

## 2021-06-26 NOTE — Discharge Summary (Signed)
Patient ID: Beth Rodgers MRN: [id number] DOB/AGE: [birth year] 86 y.o.  Admit date: 06/25/2021 Discharge date: 06/26/2021   Discharge Diagnoses:  Active Problems:   Breast cancer [facility])   Procedures: Left total mastectomy and sentinel lymph node biopsy  Hospital Course: Patient with extensive DCIS.  She underwent total mastectomy of the left breast.  She tolerated the procedure well.  This morning patient ambulating, pain control.  Wounds with viable flaps.  Drain in place.  Physical Exam Cardiovascular:     Rate and Rhythm: Normal rate and regular rhythm.  Pulmonary:     Effort: Pulmonary effort is normal.  Abdominal:     General: Abdomen is flat.     Palpations: Abdomen is soft.  Musculoskeletal:     Cervical back: Normal range of motion.  Neurological:     Mental Status: She is alert.  Chest: Left chest wound with bruise on the left axillary tail and in the lower medial portion.  No fluid collection.  No ischemia.   Consults: None  Disposition: Discharge disposition: 01-Home or Self Care       Discharge Instructions     Diet - low sodium heart healthy   Complete by: As directed    Increase activity slowly   Complete by: As directed       Allergies as of 06/26/2021       Reactions   Anastrozole Other (See Comments)   Back pain    Exemestane    Pain in right hand    Naproxen Hives   Had bumps break out on the back         Medication List     TAKE these medications    alendronate 70 MG tablet Commonly known as: FOSAMAX Take 70 mg by mouth every Sunday.   CALCIUM 600 + D PO Take 1 tablet by mouth daily.   calcium carbonate 750 MG chewable tablet Commonly known as: TUMS EX Chew 750 mg by mouth daily as needed for heartburn.   Ciclopirox 0.77 % gel Apply 1 application topically 2 (two) times daily as needed (fungus).   HYDROcodone-acetaminophen 5-325 MG tablet Commonly known as: Norco Take 1 tablet by mouth every 4 (four) hours as needed  for up to 3 days for moderate pain.   levothyroxine 75 MCG tablet Commonly known as: SYNTHROID Take 1 tablet (75 mcg total) by mouth daily.   losartan 100 MG tablet Commonly known as: COZAAR TAKE 1 TABLET BY MOUTH EVERY DAY   Magnesium 400 MG Tabs Take 400 mg by mouth daily.   multivitamin tablet Take 1 tablet by mouth daily.   Nitrostat 0.4 MG SL tablet Generic drug: nitroGLYCERIN Place 0.4 mg under the tongue every 5 (five) minutes as needed for chest pain.   tacrolimus 0.1 % ointment Commonly known as: PROTOPIC APPLY TOPICALLY TO AFFECTED AREA(S) TWICE DAILY   Travoprost (BAK Free) 0.004 % Soln ophthalmic solution Commonly known as: TRAVATAN Place 1 drop into both eyes at bedtime.   vitamin C 500 MG tablet Commonly known as: ASCORBIC ACID Take 500 mg by mouth daily.   Vitamin D 50 MCG (2000 UT) Caps Take 1 capsule (2,000 Units total) by mouth daily.   vitamin E 180 MG (400 UNITS) capsule Take 400 Units by mouth daily.       ASK your doctor about these medications    clobetasol cream 0.05 % Commonly known as: TEMOVATE Apply 1 application topically as directed. Qd to bid up to 5 days a  week to aa psoriasis on body until clear, then prn flares, avoid face, groin, axilla   diclofenac sodium 1 % Gel Commonly known as: VOLTAREN Apply 1 application topically 3 (three) times daily.   silver sulfADIAZINE 1 % cream Commonly known as: Silvadene Apply 1 application topically daily.        Follow-up Information     [name], MD Follow up in 1 week(s).   Specialty: General Surgery Why: Evaluation of drain Contact information: [place] [address] [phone]

## 2021-06-27 DIAGNOSIS — E559 Vitamin D deficiency, unspecified: Secondary | ICD-10-CM | POA: Diagnosis not present

## 2021-06-27 DIAGNOSIS — M1990 Unspecified osteoarthritis, unspecified site: Secondary | ICD-10-CM | POA: Diagnosis not present

## 2021-06-27 DIAGNOSIS — Z483 Aftercare following surgery for neoplasm: Secondary | ICD-10-CM | POA: Diagnosis not present

## 2021-06-27 DIAGNOSIS — E079 Disorder of thyroid, unspecified: Secondary | ICD-10-CM | POA: Diagnosis not present

## 2021-06-27 DIAGNOSIS — M47817 Spondylosis without myelopathy or radiculopathy, lumbosacral region: Secondary | ICD-10-CM | POA: Diagnosis not present

## 2021-06-27 DIAGNOSIS — H409 Unspecified glaucoma: Secondary | ICD-10-CM | POA: Diagnosis not present

## 2021-06-27 DIAGNOSIS — Z170 Estrogen receptor positive status [ER+]: Secondary | ICD-10-CM | POA: Diagnosis not present

## 2021-06-27 DIAGNOSIS — Z9181 History of falling: Secondary | ICD-10-CM | POA: Diagnosis not present

## 2021-06-27 DIAGNOSIS — M6281 Muscle weakness (generalized): Secondary | ICD-10-CM | POA: Diagnosis not present

## 2021-06-27 DIAGNOSIS — C4491 Basal cell carcinoma of skin, unspecified: Secondary | ICD-10-CM | POA: Diagnosis not present

## 2021-06-27 DIAGNOSIS — C50912 Malignant neoplasm of unspecified site of left female breast: Secondary | ICD-10-CM | POA: Diagnosis not present

## 2021-06-27 DIAGNOSIS — Z741 Need for assistance with personal care: Secondary | ICD-10-CM | POA: Diagnosis not present

## 2021-06-30 LAB — SURGICAL PATHOLOGY

## 2021-07-01 DIAGNOSIS — M47817 Spondylosis without myelopathy or radiculopathy, lumbosacral region: Secondary | ICD-10-CM | POA: Diagnosis not present

## 2021-07-01 DIAGNOSIS — C4491 Basal cell carcinoma of skin, unspecified: Secondary | ICD-10-CM | POA: Diagnosis not present

## 2021-07-01 DIAGNOSIS — C50912 Malignant neoplasm of unspecified site of left female breast: Secondary | ICD-10-CM | POA: Diagnosis not present

## 2021-07-01 DIAGNOSIS — Z483 Aftercare following surgery for neoplasm: Secondary | ICD-10-CM | POA: Diagnosis not present

## 2021-07-01 DIAGNOSIS — M1990 Unspecified osteoarthritis, unspecified site: Secondary | ICD-10-CM | POA: Diagnosis not present

## 2021-07-01 DIAGNOSIS — H409 Unspecified glaucoma: Secondary | ICD-10-CM | POA: Diagnosis not present

## 2021-07-02 ENCOUNTER — Telehealth: Payer: Self-pay | Admitting: *Deleted

## 2021-07-02 NOTE — Telephone Encounter (Signed)
Agree with increasing water intake. Recommend hold losartan for now, she may have BP checked at upcoming gen surg appt and if BP staying low, let me know.

## 2021-07-02 NOTE — Telephone Encounter (Signed)
Patient called and informed that Dr. Danise Mina agreed with her increasing her water intake. Patient has informed me she did not take her losartan last night and won't take it tonight for her appointment tomorrow. Patient stated that she was very grateful for the call and she will inform us of her blood pressure tomorrow after her appointment.

## 2021-07-02 NOTE — Telephone Encounter (Signed)
Patient called stating that she had a mastectomy 06/25/21. Patient stated that a nurse has been sent out twice to see her. Patient stated that the nurse told her that her blood pressure was low yesterday and she thinks that it was 100/60. Patient stated that she has felt weaker the past couple of days. Patient stated that the nurse told her to drink more water. Patient stated that she is on Losartan because she usually has high blood pressure. Patient stated that she usually takes her blood pressure medication at night but did not take it last night. Patient stated that she does not have a way of checking her blood pressure today. Patient stated that she does have an appointment scheduled with her surgeon tomorrow. Patient denies that she is dizzy or has any other symptoms other than a little weaker today than she was after surgery.. Patient stated that she has also left a message for her surgeon. Patient wants to know if Dr. Danise Mina thinks that she needs to come in and see him.

## 2021-07-03 DIAGNOSIS — C4491 Basal cell carcinoma of skin, unspecified: Secondary | ICD-10-CM | POA: Diagnosis not present

## 2021-07-03 DIAGNOSIS — H409 Unspecified glaucoma: Secondary | ICD-10-CM | POA: Diagnosis not present

## 2021-07-03 DIAGNOSIS — C50912 Malignant neoplasm of unspecified site of left female breast: Secondary | ICD-10-CM | POA: Diagnosis not present

## 2021-07-03 DIAGNOSIS — M1990 Unspecified osteoarthritis, unspecified site: Secondary | ICD-10-CM | POA: Diagnosis not present

## 2021-07-03 DIAGNOSIS — M47817 Spondylosis without myelopathy or radiculopathy, lumbosacral region: Secondary | ICD-10-CM | POA: Diagnosis not present

## 2021-07-03 DIAGNOSIS — Z483 Aftercare following surgery for neoplasm: Secondary | ICD-10-CM | POA: Diagnosis not present

## 2021-07-04 DIAGNOSIS — Z483 Aftercare following surgery for neoplasm: Secondary | ICD-10-CM | POA: Diagnosis not present

## 2021-07-04 DIAGNOSIS — M1990 Unspecified osteoarthritis, unspecified site: Secondary | ICD-10-CM | POA: Diagnosis not present

## 2021-07-04 DIAGNOSIS — C4491 Basal cell carcinoma of skin, unspecified: Secondary | ICD-10-CM | POA: Diagnosis not present

## 2021-07-04 DIAGNOSIS — C50912 Malignant neoplasm of unspecified site of left female breast: Secondary | ICD-10-CM | POA: Diagnosis not present

## 2021-07-04 DIAGNOSIS — H409 Unspecified glaucoma: Secondary | ICD-10-CM | POA: Diagnosis not present

## 2021-07-04 DIAGNOSIS — M47817 Spondylosis without myelopathy or radiculopathy, lumbosacral region: Secondary | ICD-10-CM | POA: Diagnosis not present

## 2021-07-08 ENCOUNTER — Ambulatory Visit (INDEPENDENT_AMBULATORY_CARE_PROVIDER_SITE_OTHER): Payer: Medicare Other | Admitting: Psychology

## 2021-07-08 DIAGNOSIS — C50912 Malignant neoplasm of unspecified site of left female breast: Secondary | ICD-10-CM | POA: Diagnosis not present

## 2021-07-08 DIAGNOSIS — F4323 Adjustment disorder with mixed anxiety and depressed mood: Secondary | ICD-10-CM | POA: Diagnosis not present

## 2021-07-08 DIAGNOSIS — M1990 Unspecified osteoarthritis, unspecified site: Secondary | ICD-10-CM | POA: Diagnosis not present

## 2021-07-08 DIAGNOSIS — Z483 Aftercare following surgery for neoplasm: Secondary | ICD-10-CM | POA: Diagnosis not present

## 2021-07-08 DIAGNOSIS — M47817 Spondylosis without myelopathy or radiculopathy, lumbosacral region: Secondary | ICD-10-CM | POA: Diagnosis not present

## 2021-07-08 DIAGNOSIS — C4491 Basal cell carcinoma of skin, unspecified: Secondary | ICD-10-CM | POA: Diagnosis not present

## 2021-07-08 DIAGNOSIS — H409 Unspecified glaucoma: Secondary | ICD-10-CM | POA: Diagnosis not present

## 2021-07-09 DIAGNOSIS — Z483 Aftercare following surgery for neoplasm: Secondary | ICD-10-CM | POA: Diagnosis not present

## 2021-07-09 DIAGNOSIS — M47817 Spondylosis without myelopathy or radiculopathy, lumbosacral region: Secondary | ICD-10-CM | POA: Diagnosis not present

## 2021-07-09 DIAGNOSIS — H409 Unspecified glaucoma: Secondary | ICD-10-CM | POA: Diagnosis not present

## 2021-07-09 DIAGNOSIS — C50912 Malignant neoplasm of unspecified site of left female breast: Secondary | ICD-10-CM | POA: Diagnosis not present

## 2021-07-09 DIAGNOSIS — C4491 Basal cell carcinoma of skin, unspecified: Secondary | ICD-10-CM | POA: Diagnosis not present

## 2021-07-09 DIAGNOSIS — M1990 Unspecified osteoarthritis, unspecified site: Secondary | ICD-10-CM | POA: Diagnosis not present

## 2021-07-09 NOTE — Anesthesia Postprocedure Evaluation (Signed)
Anesthesia Post Note  Patient: Beth Rodgers  Procedure(s) Performed: MASTECTOMY WITH SENTINEL LYMPH NODE BIOPSY (Left: Breast)  Patient location during evaluation: PACU Anesthesia Type: General Level of consciousness: awake and alert Pain management: pain level controlled Vital Signs Assessment: post-procedure vital signs reviewed and stable Respiratory status: spontaneous breathing, nonlabored ventilation, respiratory function stable and patient connected to nasal cannula oxygen Cardiovascular status: blood pressure returned to baseline and stable Postop Assessment: no apparent nausea or vomiting Anesthetic complications: no   No notable events documented.   Last Vitals:  Vitals:   06/26/21 0514 06/26/21 0853  BP: (!) 143/70 114/60  Pulse: 71 70  Resp: 18 18  Temp: (!) 36.4 C (!) 36.4 C  SpO2: 99% 99%    Last Pain:  Vitals:   06/26/21 0853  TempSrc: Oral  PainSc:                  [name]

## 2021-07-10 DIAGNOSIS — Z483 Aftercare following surgery for neoplasm: Secondary | ICD-10-CM | POA: Diagnosis not present

## 2021-07-10 DIAGNOSIS — H409 Unspecified glaucoma: Secondary | ICD-10-CM | POA: Diagnosis not present

## 2021-07-10 DIAGNOSIS — M47817 Spondylosis without myelopathy or radiculopathy, lumbosacral region: Secondary | ICD-10-CM | POA: Diagnosis not present

## 2021-07-10 DIAGNOSIS — M1990 Unspecified osteoarthritis, unspecified site: Secondary | ICD-10-CM | POA: Diagnosis not present

## 2021-07-10 DIAGNOSIS — C50912 Malignant neoplasm of unspecified site of left female breast: Secondary | ICD-10-CM | POA: Diagnosis not present

## 2021-07-10 DIAGNOSIS — C4491 Basal cell carcinoma of skin, unspecified: Secondary | ICD-10-CM | POA: Diagnosis not present

## 2021-07-11 DIAGNOSIS — M1990 Unspecified osteoarthritis, unspecified site: Secondary | ICD-10-CM | POA: Diagnosis not present

## 2021-07-11 DIAGNOSIS — Z483 Aftercare following surgery for neoplasm: Secondary | ICD-10-CM | POA: Diagnosis not present

## 2021-07-11 DIAGNOSIS — C50912 Malignant neoplasm of unspecified site of left female breast: Secondary | ICD-10-CM | POA: Diagnosis not present

## 2021-07-11 DIAGNOSIS — H409 Unspecified glaucoma: Secondary | ICD-10-CM | POA: Diagnosis not present

## 2021-07-11 DIAGNOSIS — C4491 Basal cell carcinoma of skin, unspecified: Secondary | ICD-10-CM | POA: Diagnosis not present

## 2021-07-11 DIAGNOSIS — M47817 Spondylosis without myelopathy or radiculopathy, lumbosacral region: Secondary | ICD-10-CM | POA: Diagnosis not present

## 2021-07-14 DIAGNOSIS — M47817 Spondylosis without myelopathy or radiculopathy, lumbosacral region: Secondary | ICD-10-CM | POA: Diagnosis not present

## 2021-07-14 DIAGNOSIS — Z483 Aftercare following surgery for neoplasm: Secondary | ICD-10-CM | POA: Diagnosis not present

## 2021-07-14 DIAGNOSIS — C50912 Malignant neoplasm of unspecified site of left female breast: Secondary | ICD-10-CM | POA: Diagnosis not present

## 2021-07-14 DIAGNOSIS — H409 Unspecified glaucoma: Secondary | ICD-10-CM | POA: Diagnosis not present

## 2021-07-14 DIAGNOSIS — M1990 Unspecified osteoarthritis, unspecified site: Secondary | ICD-10-CM | POA: Diagnosis not present

## 2021-07-14 DIAGNOSIS — C4491 Basal cell carcinoma of skin, unspecified: Secondary | ICD-10-CM | POA: Diagnosis not present

## 2021-07-16 DIAGNOSIS — H409 Unspecified glaucoma: Secondary | ICD-10-CM | POA: Diagnosis not present

## 2021-07-16 DIAGNOSIS — M1990 Unspecified osteoarthritis, unspecified site: Secondary | ICD-10-CM | POA: Diagnosis not present

## 2021-07-16 DIAGNOSIS — M47817 Spondylosis without myelopathy or radiculopathy, lumbosacral region: Secondary | ICD-10-CM | POA: Diagnosis not present

## 2021-07-16 DIAGNOSIS — C50912 Malignant neoplasm of unspecified site of left female breast: Secondary | ICD-10-CM | POA: Diagnosis not present

## 2021-07-16 DIAGNOSIS — Z483 Aftercare following surgery for neoplasm: Secondary | ICD-10-CM | POA: Diagnosis not present

## 2021-07-16 DIAGNOSIS — C4491 Basal cell carcinoma of skin, unspecified: Secondary | ICD-10-CM | POA: Diagnosis not present

## 2021-07-17 DIAGNOSIS — Z483 Aftercare following surgery for neoplasm: Secondary | ICD-10-CM | POA: Diagnosis not present

## 2021-07-17 DIAGNOSIS — C4491 Basal cell carcinoma of skin, unspecified: Secondary | ICD-10-CM | POA: Diagnosis not present

## 2021-07-17 DIAGNOSIS — H409 Unspecified glaucoma: Secondary | ICD-10-CM | POA: Diagnosis not present

## 2021-07-17 DIAGNOSIS — C50912 Malignant neoplasm of unspecified site of left female breast: Secondary | ICD-10-CM | POA: Diagnosis not present

## 2021-07-17 DIAGNOSIS — M1990 Unspecified osteoarthritis, unspecified site: Secondary | ICD-10-CM | POA: Diagnosis not present

## 2021-07-17 DIAGNOSIS — M47817 Spondylosis without myelopathy or radiculopathy, lumbosacral region: Secondary | ICD-10-CM | POA: Diagnosis not present

## 2021-07-21 ENCOUNTER — Encounter: Payer: Self-pay | Admitting: Family Medicine

## 2021-07-21 ENCOUNTER — Other Ambulatory Visit: Payer: Self-pay

## 2021-07-21 ENCOUNTER — Ambulatory Visit (INDEPENDENT_AMBULATORY_CARE_PROVIDER_SITE_OTHER): Payer: Medicare Other | Admitting: Family Medicine

## 2021-07-21 VITALS — BP 132/80 | HR 75 | Temp 98.2°F | Ht 63.0 in | Wt 170.8 lb

## 2021-07-21 DIAGNOSIS — M542 Cervicalgia: Secondary | ICD-10-CM

## 2021-07-21 DIAGNOSIS — M7660 Achilles tendinitis, unspecified leg: Secondary | ICD-10-CM

## 2021-07-21 MED ORDER — PREDNISONE 20 MG PO TABS: ORAL_TABLET | ORAL | 0 refills | Status: DC

## 2021-07-21 NOTE — Progress Notes (Signed)
Elecia Serafin T. Alma Muegge, MD, [facility] [place], [address]  Phone: [phone]  FAX: [phone]  Beth Rodgers - 86 y.o. female  MRN [id number]  Date of Birth: 1935-07-25  Date: 07/21/2021  PCP: Ria Bush, MD  Referral: Ria Bush, MD  Chief Complaint  Patient presents with   Follow-up    Knee/Heel Pain   Neck Pain    This visit occurred during the SARS-CoV-2 public health emergency.  Safety protocols were in place, including screening questions prior to the visit, additional usage of staff PPE, and extensive cleaning of exam room while observing appropriate contact time as indicated for disinfecting solutions.   Subjective:   Beth Rodgers is a 86 y.o. very pleasant female patient with Body mass index is 30.25 kg/m. who presents with the following:  Very recent breast cancer surgery.  She is here to talk about a few different musculoskeletal complaints.  She did have breast cancer surgery approximately 1 month ago.  06/09/2021 - OV for L knee pain.  Has some PT for breast cancer.   PT and OT. This is about to end. Pain will wake up at night.   She does have a history of plantar fasciitis before, she wonders if this is it.  She does have pain predominantly on the L posterior aspect of her heel.  This has been chronic in character and greater than 6 months now.  She has seen Dr. Doran Durand in the past as well as an orthopedist in [place].  She does have some noted calcific insertional changes on plain film.  I am not able to visualize these myself.  Has PF splint to wear at night.  This does not seem to be helping at all.  Neck has been about four or five months ago.  She has very significant loss of motion.  She has pain with terminal motion and all.  L achilles tendonitis.  She has not really done any significant rehab with this.   Review of Systems is noted in the HPI, as appropriate    Objective:   BP 132/80   Pulse 75   Temp 98.2 F (36.8 C) (Temporal)   Ht '5\' 3"'$  (1.6 m)   Wt 170 lb 12 oz (77.5 kg)   SpO2 97%   BMI 30.25 kg/m    GEN: alert,appropriate PSYCH: Normally interactive. Cooperative during the interview.   CERVICAL SPINE EXAM Range of motion: Flexion, extension, lateral bending, and rotation: She has a global 60% loss of motion Pain with terminal motion: Yes Spinous Processes: NT SCM: NT Upper paracervical muscles: Diffusely tender Upper traps: Tender C5-T1 intact, sensation and motor    Foot exam: L Echymosis: no Edema: no ROM: full LE B Gait: heel toe, non-antalgic MT pain: no Callus pattern: none Lateral Mall: NT Medial Mall: NT Talus: NT Navicular: NT Calcaneous: NT Metatarsals: NT 5th MT: NT Phalanges: NT Achilles: Notable bogginess and tender at the insertion Plantar Fascia: Nontender  fat Pad: NT Peroneals: NT Post Tib: NT Great Toe: Nml motion Ant Drawer: neg Sensation: intact   Radiology:  Assessment and Plan:     ICD-10-CM   1. Insertional Achilles tendinopathy  M76.60 Ambulatory referral to Physical Therapy    2. Cervicalgia  M54.2 Ambulatory referral to Physical Therapy     Acute on chronic neck pain and Achilles tendinopathy with exacerbation.  With multiple joint complaints including neck, Achilles tendinopathy, very minor plantar  fascia tenderness, ongoing knee pain.  I Georgina Peer give her some oral steroids to help hopefully all of these things.  I think that she has a good understanding that her neck will never get to be 100%, but anything that can be done to increased her functionality is a good goal.  Formal physical therapy to work on neck range of motion as well as rehab for Achilles tendinopathy.  I did review some very basic McKenzie exercises for the patient.  Meds ordered this encounter  Medications   predniSONE (DELTASONE) 20 MG tablet    Sig: 2 tabs po daily for 5 days, then 1 tab po daily  for 5 days    Dispense:  15 tablet    Refill:  0   There are no discontinued medications. Orders Placed This Encounter  Procedures   Ambulatory referral to Physical Therapy    Follow-up: No follow-ups on file.  Signed,  Maud Deed. Kerri Asche, MD   Outpatient Encounter Medications as of 07/21/2021  Medication Sig   alendronate (FOSAMAX) 70 MG tablet Take 70 mg by mouth every Sunday.   Calcium Carb-Cholecalciferol (CALCIUM 600 + D PO) Take 1 tablet by mouth daily.   calcium carbonate (TUMS EX) 750 MG chewable tablet Chew 750 mg by mouth daily as needed for heartburn.   Cholecalciferol (VITAMIN D) 50 MCG (2000 UT) CAPS Take 1 capsule (2,000 Units total) by mouth daily.   Ciclopirox 0.77 % gel Apply 1 application topically 2 (two) times daily as needed (fungus).   clobetasol cream (TEMOVATE) [id number] % Apply 1 application topically as directed. Qd to bid up to 5 days a week to aa psoriasis on body until clear, then prn flares, avoid face, groin, axilla (Patient taking differently: Apply 1 application topically 2 (two) times daily.)   diclofenac sodium (VOLTAREN) 1 % GEL Apply 1 application topically 3 (three) times daily. (Patient taking differently: Apply 1 application topically 3 (three) times daily as needed (pain).)   levothyroxine (SYNTHROID) 75 MCG tablet Take 1 tablet (75 mcg total) by mouth daily.   losartan (COZAAR) 100 MG tablet TAKE 1 TABLET BY MOUTH EVERY DAY   Magnesium 400 MG TABS Take 400 mg by mouth daily.   Multiple Vitamin (MULTIVITAMIN) tablet Take 1 tablet by mouth daily.   NITROSTAT 0.4 MG SL tablet Place 0.4 mg under the tongue every 5 (five) minutes as needed for chest pain.    predniSONE (DELTASONE) 20 MG tablet 2 tabs po daily for 5 days, then 1 tab po daily for 5 days   silver sulfADIAZINE (SILVADENE) 1 % cream Apply 1 application topically daily. (Patient taking differently: Apply 1 application topically 2 (two) times daily.)   tacrolimus (PROTOPIC) 0.1 % ointment  APPLY TOPICALLY TO AFFECTED AREA(S) TWICE DAILY   Travoprost, BAK Free, (TRAVATAN) 0.004 % SOLN ophthalmic solution Place 1 drop into both eyes at bedtime.   vitamin C (ASCORBIC ACID) 500 MG tablet Take 500 mg by mouth daily.   vitamin E 180 MG (400 UNITS) capsule Take 400 Units by mouth daily.   No facility-administered encounter medications on file as of 07/21/2021.

## 2021-07-22 ENCOUNTER — Ambulatory Visit (INDEPENDENT_AMBULATORY_CARE_PROVIDER_SITE_OTHER): Payer: Medicare Other | Admitting: Psychology

## 2021-07-22 ENCOUNTER — Encounter: Payer: Self-pay | Admitting: Family Medicine

## 2021-07-22 DIAGNOSIS — F4323 Adjustment disorder with mixed anxiety and depressed mood: Secondary | ICD-10-CM | POA: Diagnosis not present

## 2021-07-23 DIAGNOSIS — C50912 Malignant neoplasm of unspecified site of left female breast: Secondary | ICD-10-CM | POA: Diagnosis not present

## 2021-07-23 DIAGNOSIS — M1990 Unspecified osteoarthritis, unspecified site: Secondary | ICD-10-CM | POA: Diagnosis not present

## 2021-07-23 DIAGNOSIS — H409 Unspecified glaucoma: Secondary | ICD-10-CM | POA: Diagnosis not present

## 2021-07-23 DIAGNOSIS — Z483 Aftercare following surgery for neoplasm: Secondary | ICD-10-CM | POA: Diagnosis not present

## 2021-07-23 DIAGNOSIS — C4491 Basal cell carcinoma of skin, unspecified: Secondary | ICD-10-CM | POA: Diagnosis not present

## 2021-07-23 DIAGNOSIS — M47817 Spondylosis without myelopathy or radiculopathy, lumbosacral region: Secondary | ICD-10-CM | POA: Diagnosis not present

## 2021-07-25 ENCOUNTER — Encounter: Payer: Self-pay | Admitting: Oncology

## 2021-07-25 ENCOUNTER — Other Ambulatory Visit: Payer: Self-pay

## 2021-07-25 ENCOUNTER — Inpatient Hospital Stay: Payer: Medicare Other | Attending: Oncology | Admitting: Oncology

## 2021-07-25 VITALS — BP 107/60 | HR 80 | Temp 98.4°F | Resp 24 | Wt 173.9 lb

## 2021-07-25 DIAGNOSIS — Z170 Estrogen receptor positive status [ER+]: Secondary | ICD-10-CM

## 2021-07-25 DIAGNOSIS — M1990 Unspecified osteoarthritis, unspecified site: Secondary | ICD-10-CM | POA: Diagnosis not present

## 2021-07-25 DIAGNOSIS — Z800 Family history of malignant neoplasm of digestive organs: Secondary | ICD-10-CM | POA: Diagnosis not present

## 2021-07-25 DIAGNOSIS — Z85820 Personal history of malignant melanoma of skin: Secondary | ICD-10-CM | POA: Insufficient documentation

## 2021-07-25 DIAGNOSIS — Z808 Family history of malignant neoplasm of other organs or systems: Secondary | ICD-10-CM | POA: Insufficient documentation

## 2021-07-25 DIAGNOSIS — C50912 Malignant neoplasm of unspecified site of left female breast: Secondary | ICD-10-CM | POA: Diagnosis not present

## 2021-07-25 DIAGNOSIS — Z483 Aftercare following surgery for neoplasm: Secondary | ICD-10-CM | POA: Diagnosis not present

## 2021-07-25 DIAGNOSIS — Z803 Family history of malignant neoplasm of breast: Secondary | ICD-10-CM | POA: Insufficient documentation

## 2021-07-25 DIAGNOSIS — Z780 Asymptomatic menopausal state: Secondary | ICD-10-CM | POA: Diagnosis not present

## 2021-07-25 DIAGNOSIS — Z9012 Acquired absence of left breast and nipple: Secondary | ICD-10-CM | POA: Insufficient documentation

## 2021-07-25 DIAGNOSIS — C50812 Malignant neoplasm of overlapping sites of left female breast: Secondary | ICD-10-CM | POA: Diagnosis not present

## 2021-07-25 DIAGNOSIS — H409 Unspecified glaucoma: Secondary | ICD-10-CM | POA: Diagnosis not present

## 2021-07-25 DIAGNOSIS — C4491 Basal cell carcinoma of skin, unspecified: Secondary | ICD-10-CM | POA: Diagnosis not present

## 2021-07-25 DIAGNOSIS — M47817 Spondylosis without myelopathy or radiculopathy, lumbosacral region: Secondary | ICD-10-CM | POA: Diagnosis not present

## 2021-07-25 NOTE — Progress Notes (Signed)
Patient here for pathology report and treatment plan.

## 2021-07-26 NOTE — Progress Notes (Signed)
Hematology/Oncology Consult note [facility]  Telephone:([phone] [phone]  Patient Care Team: Ria Bush, MD as PCP - General (Family Medicine) Rico Junker, RN as Registered Nurse   Name of the patient: Beth Rodgers  [id number]  03-13-1935   Date of visit: 07/26/21  Diagnosis- pathological prognostic stage Ia invasive mammary carcinoma of the left breast pT1 cpN0 cM0 ER/PR positive HER2 negative  Chief complaint/ Reason for visit-discuss postmastectomy results and further management  Heme/Onc history: Patient is a 86 year old female who self palpated a left breast mass in August 2021.  Diagnostic mammogram showed 2 adjacent masses at 4:00 and 6 o'clock position with interspersed calcifications in between spanning 4.5 cm.  Left axillary lymph nodes appeared normal.  Left breast biopsy in both 4 and 6 o'clock position showed invasive mammary carcinoma grade 1   Patient was seen by Dr. Peyton Najjar and underwent left lumpectomy with sentinel lymph node biopsy.  Final pathology showed multifocal invasive mammary carcinoma with high-grade DCIS.  1 sentinel lymph node negative for malignancy.  DCIS also noted in the left retroareolar excisional biopsy.  Tumor size 19 mm grade 1.  Lymphovascular invasion negative.  Margins were negative for invasive carcinoma but positive for DCIS posterior superior.  ER/PR positive and HER2 equivocal by IHC and negative by [facility].  Given the concern for residual multifocal DCIS and positive margin for DCIS she was recommended to undergo mastectomy but chose to hold off on getting it done in September 2021.  She has now met with Dr. Peyton Najjar again and will be undergoing left mastectomy on 06/25/2021.  Oncotype DX testing was offered in September 2021 as well and patient declined.  Patient is not currently on endocrine therapy.  Patient underwentLeft mastectomy by Dr. Peyton Najjar on 06/25/2021 due to concern for residual  multifocal DCIS.  Final pathology showed DCIS present at the base of the nipple measuring 0.5 cm.  An isolated focus of DCIS is less than 1.1 cm adjacent to the retroareolar excision site.  Surgical margins uninvolved by DCIS.  Interval history-patient is recovered from mastectomy well.  She is getting home physical therapy and is able to do her ADLs without any significant limitations.  ECOG PS- 1 Pain scale- 0   Review of systems- Review of Systems  Constitutional:  Negative for chills, fever, malaise/fatigue and weight loss.  HENT:  Negative for congestion, ear discharge and nosebleeds.   Eyes:  Negative for blurred vision.  Respiratory:  Negative for cough, hemoptysis, sputum production, shortness of breath and wheezing.   Cardiovascular:  Negative for chest pain, palpitations, orthopnea and claudication.  Gastrointestinal:  Negative for abdominal pain, blood in stool, constipation, diarrhea, heartburn, melena, nausea and vomiting.  Genitourinary:  Negative for dysuria, flank pain, frequency, hematuria and urgency.  Musculoskeletal:  Negative for back pain, joint pain and myalgias.  Skin:  Negative for rash.  Neurological:  Negative for dizziness, tingling, focal weakness, seizures, weakness and headaches.  Endo/Heme/Allergies:  Does not bruise/bleed easily.  Psychiatric/Behavioral:  Negative for depression and suicidal ideas. The patient does not have insomnia.      Allergies  Allergen Reactions   Anastrozole Other (See Comments)    Back pain    Exemestane     Pain in right hand    Naproxen Hives    Had bumps break out on the back      Past Medical History:  Diagnosis Date   Basal cell carcinoma (BCC)  txted with MOHs in past   Cellulitis 08/10/2018   Chronic venous insufficiency    with varicose veins   Family history of breast cancer    Family history of thyroid cancer    GERD (gastroesophageal reflux disease)    Glaucoma    History  of basal cell carcinoma     left nare/BREAST CANCER   History of chicken pox    Hypertension    Hypoglycemia    Hypothyroid    Osteoarthritis    back pain, L knee pain   Osteopenia 06/2009   DEXA 11/2015: T -2.3 hip, -2.2 spine, on longterm bisphosphonate/evista   Psoriasis    Pyogenic granuloma 04/16/2016     Past Surgical History:  Procedure Laterality Date   BASAL CELL CARCINOMA EXCISION     located on face   BREAST BIOPSY Left    core done ing [place] 2000?   BREAST BIOPSY Left 08/16/2020   3 area bx 4:00 X, IMC, 6:00Q IMC, LN hydro #3-benign   BREAST LUMPECTOMY Left 09/11/2020   two areas of [facility]   CATARACT EXTRACTION     bilateral   DEXA  06/2009   T score Spine: -1.8, hip -2.0   EXCISION OF BREAST BIOPSY Left 09/11/2020   Procedure: EXCISION OF BREAST BIOPSY;  Surgeon: Carolan Shiver, MD;  Location: [facility] ORS;  Service: General;  Laterality: Left;   Foam sclerotherapy Bilateral 09/2015   Jerolyn Shin, [place]   MASTECTOMY W/ SENTINEL NODE BIOPSY Left 06/25/2021   Procedure: MASTECTOMY WITH SENTINEL LYMPH NODE BIOPSY;  Surgeon: Carolan Shiver, MD;  Location: [facility] ORS;  Service: General;  Laterality: Left;   NASAL RECONSTRUCTION  2005   for BCC L nare s/p Mohs   nuclear stress test  2014   normal in [place]   PARTIAL MASTECTOMY WITH NEEDLE LOCALIZATION AND AXILLARY SENTINEL LYMPH NODE BX Left 09/11/2020   Procedure: PARTIAL MASTECTOMY WITH NEEDLE LOCALIZATION AND AXILLARY SENTINEL LYMPH NODE BX;  Surgeon: Carolan Shiver, MD;  Location: [facility] ORS;  Service: General;  Laterality: Left;   RADIOFREQUENCY ABLATION Left 01/2012   remnant L G saphenous vein below knee   RADIOFREQUENCY ABLATION Right 02/2013   RLE vein ablation    VEIN LIGATION AND STRIPPING  remote   bilateral G saphenous veins    Social History   Socioeconomic History   Marital status: Married    Spouse name: Not on file   Number of children: 2   Years of education: Not on file   Highest education level: Not on  file  Occupational History    Employer: RETIRED  Tobacco Use   Smoking status: Never   Smokeless tobacco: Never  Vaping Use   Vaping Use: Never used  Substance and Sexual Activity   Alcohol use: Yes    Comment: Occasionally drinks wine   Drug use: No   Sexual activity: Not Currently    Birth control/protection: Post-menopausal  Other Topics Concern   Not on file  Social History Narrative   From [place], [place]   Caffeine: 2-3 cups/day   Lives with husband, majority of time alone as he travels to [place].   Has grandchildren nearby.   Occ: Psychologist, educational, Wellsite geologist   Activity: bed exercises, limiting walking   Diet: does get fruits and vegetables, only some water   Social Determinants of Health   Financial Resource Strain: Low Risk    Difficulty of Paying Living Expenses: Not hard at all  Food Insecurity: No Food Insecurity  Worried About Charity fundraiser in the Last Year: Never true   [place] in the Last Year: Never true  Transportation Needs: No Transportation Needs   Lack of Transportation (Medical): No   Lack of Transportation (Non-Medical): No  Physical Activity: Sufficiently Active   Days of Exercise per Week: 7 days   Minutes of Exercise per Session: 30 min  Stress: No Stress Concern Present   Feeling of Stress : Not at all  Social Connections: Not on file  Intimate Partner Violence: Not At Risk   Fear of Current or Ex-Partner: No   Emotionally Abused: No   Physically Abused: No   Sexually Abused: No    Family History  Problem Relation Age of Onset   Cancer Mother        thyroid cancer   Heart disease Father        CHF   Diabetes Father    Glaucoma Father    Arthritis Father    Coronary artery disease Maternal Uncle    Bipolar disorder Son    Heart disease Sister        CHF   Stroke Sister    Brain cancer Grandson        astrocytoma   Breast cancer Cousin        dx 21s     Current Outpatient Medications:    alendronate  (FOSAMAX) 70 MG tablet, Take 70 mg by mouth every Sunday., Disp: , Rfl:    Calcium Carb-Cholecalciferol (CALCIUM 600 + D PO), Take 1 tablet by mouth daily., Disp: , Rfl:    calcium carbonate (TUMS EX) 750 MG chewable tablet, Chew 750 mg by mouth daily as needed for heartburn., Disp: , Rfl:    Ciclopirox 0.77 % gel, Apply 1 application topically 2 (two) times daily as needed (fungus)., Disp: , Rfl:    clobetasol cream (TEMOVATE) 3.33 %, Apply 1 application topically as directed. Qd to bid up to 5 days a week to aa psoriasis on body until clear, then prn flares, avoid face, groin, axilla (Patient taking differently: Apply 1 application topically 2 (two) times daily.), Disp: 60 g, Rfl: 2   diclofenac sodium (VOLTAREN) 1 % GEL, Apply 1 application topically 3 (three) times daily. (Patient taking differently: Apply 1 application topically 3 (three) times daily as needed (pain).), Disp: 1 Tube, Rfl: 0   levothyroxine (SYNTHROID) 75 MCG tablet, Take 1 tablet (75 mcg total) by mouth daily., Disp: 90 tablet, Rfl: 3   losartan (COZAAR) 100 MG tablet, TAKE 1 TABLET BY MOUTH EVERY DAY, Disp: 90 tablet, Rfl: 0   Magnesium 400 MG TABS, Take 400 mg by mouth daily., Disp: , Rfl:    Multiple Vitamin (MULTIVITAMIN) tablet, Take 1 tablet by mouth daily., Disp: , Rfl:    NITROSTAT 0.4 MG SL tablet, Place 0.4 mg under the tongue every 5 (five) minutes as needed for chest pain. , Disp: , Rfl:    predniSONE (DELTASONE) 20 MG tablet, 2 tabs po daily for 5 days, then 1 tab po daily for 5 days, Disp: 15 tablet, Rfl: 0   silver sulfADIAZINE (SILVADENE) 1 % cream, Apply 1 application topically daily. (Patient taking differently: Apply 1 application topically 2 (two) times daily.), Disp: 50 g, Rfl: 2   tacrolimus (PROTOPIC) 0.1 % ointment, APPLY TOPICALLY TO AFFECTED AREA(S) TWICE DAILY, Disp: 30 g, Rfl: 0   Travoprost, BAK Free, (TRAVATAN) 0.004 % SOLN ophthalmic solution, Place 1 drop into both eyes at bedtime.,  Disp: , Rfl:     vitamin C (ASCORBIC ACID) 500 MG tablet, Take 500 mg by mouth daily., Disp: , Rfl:    vitamin E 180 MG (400 UNITS) capsule, Take 400 Units by mouth daily., Disp: , Rfl:    Cholecalciferol (VITAMIN D) 50 MCG (2000 UT) CAPS, Take 1 capsule (2,000 Units total) by mouth daily. (Patient not taking: Reported on 07/25/2021), Disp: 30 capsule, Rfl:   Physical exam:  Vitals:   07/25/21 1024  BP: 107/60  Pulse: 80  Resp: (!) 24  Temp: 98.4 F (36.9 C)  TempSrc: Tympanic  Weight: 173 lb 14.4 oz (78.9 kg)   Physical Exam Constitutional:      General: She is not in acute distress. Cardiovascular:     Rate and Rhythm: Normal rate and regular rhythm.     Heart sounds: Normal heart sounds.  Pulmonary:     Effort: Pulmonary effort is normal.     Breath sounds: Normal breath sounds.  Skin:    General: Skin is warm and dry.  Neurological:     Mental Status: She is alert and oriented to person, place, and time.    Breast exam: Patient is s/p left mastectomy without reconstruction.  Surgical scar is healing well and no evidence of infection or inflammation.   CMP Latest Ref Rng & Units 06/02/2021  Glucose 70 - 99 mg/dL 82  BUN 6 - 23 mg/dL 19  Creatinine 0.40 - 1.20 mg/dL 0.72  Sodium 135 - 145 mEq/L 139  Potassium 3.5 - 5.1 mEq/L 4.3  Chloride 96 - 112 mEq/L 102  CO2 19 - 32 mEq/L 27  Calcium 8.4 - 10.5 mg/dL 9.8  Total Protein 6.5 - 8.1 g/dL -  Total Bilirubin 0.3 - 1.2 mg/dL -  Alkaline Phos 38 - 126 U/L -  AST 15 - 41 U/L -  ALT 0 - 44 U/L -   CBC Latest Ref Rng & Units 06/02/2021  WBC 4.0 - 10.5 K/uL 6.3  Hemoglobin 12.0 - 15.0 g/dL 13.9  Hematocrit 36.0 - 46.0 % 40.9  Platelets 150.0 - 400.0 K/uL 230.0    Assessment and plan- Patient is a 86 y.o. female with stage Ia invasive mammary carcinoma of the left breast pT1 cN0 M0 ER/PR positive HER2 negative status postlumpectomy and sentinel lymph node biopsy.  Patient underwent left mastectomy without reconstruction for concerns of  residual DCIS and here to discuss final pathology results and further management   Discussed the results of final left mastectomy pathology which showed 0.5 cm DCIS at the base of the left nipple.  Surgical margins were negative but at 0.1 cm from the retroareolar tissue.  Ideally we needed to millimeter margin for DCIS but given that she had invasive cancer associated with DCIS previously a negative surgical margin would be acceptable especially since patient is already undergone a mastectomy.  I do not think she would benefit from postmastectomy radiation.  We discussed that her invasive cancer was ER/PR positive and therefore there would be ideally role for endocrine therapy for 5 years.  Patient has not tolerated AI's in the past and her only option presently to consider would be tamoxifen.  Patient is wary to try any hormone therapy at this time due to concerns for side effects.  She understands that there is a potential risk of recurrence with stage I breast cancer despite left mastectomy.  However she is concerned about impact of hormone therapy on her quality of life and wants to hold  off on starting tamoxifen.  I will see her back sometime in late November before she goes to [place] for routine physical.  She will continue to receive yearly right breast mammograms for surveillance.  Patient verbalized understanding of the plan   Visit Diagnosis 1. Malignant neoplasm of overlapping sites of left breast in female, estrogen receptor positive ([place])      Dr. Randa Evens, MD, MPH [facility] at [facility] [phone] 07/26/2021 9:46 AM

## 2021-07-27 DIAGNOSIS — C50912 Malignant neoplasm of unspecified site of left female breast: Secondary | ICD-10-CM | POA: Diagnosis not present

## 2021-07-27 DIAGNOSIS — C4491 Basal cell carcinoma of skin, unspecified: Secondary | ICD-10-CM | POA: Diagnosis not present

## 2021-07-27 DIAGNOSIS — E559 Vitamin D deficiency, unspecified: Secondary | ICD-10-CM | POA: Diagnosis not present

## 2021-07-27 DIAGNOSIS — E079 Disorder of thyroid, unspecified: Secondary | ICD-10-CM | POA: Diagnosis not present

## 2021-07-27 DIAGNOSIS — M47817 Spondylosis without myelopathy or radiculopathy, lumbosacral region: Secondary | ICD-10-CM | POA: Diagnosis not present

## 2021-07-27 DIAGNOSIS — M1990 Unspecified osteoarthritis, unspecified site: Secondary | ICD-10-CM | POA: Diagnosis not present

## 2021-07-27 DIAGNOSIS — H409 Unspecified glaucoma: Secondary | ICD-10-CM | POA: Diagnosis not present

## 2021-07-27 DIAGNOSIS — M6281 Muscle weakness (generalized): Secondary | ICD-10-CM | POA: Diagnosis not present

## 2021-07-27 DIAGNOSIS — Z741 Need for assistance with personal care: Secondary | ICD-10-CM | POA: Diagnosis not present

## 2021-07-27 DIAGNOSIS — Z9181 History of falling: Secondary | ICD-10-CM | POA: Diagnosis not present

## 2021-07-27 DIAGNOSIS — Z170 Estrogen receptor positive status [ER+]: Secondary | ICD-10-CM | POA: Diagnosis not present

## 2021-07-27 DIAGNOSIS — Z483 Aftercare following surgery for neoplasm: Secondary | ICD-10-CM | POA: Diagnosis not present

## 2021-07-28 DIAGNOSIS — M47817 Spondylosis without myelopathy or radiculopathy, lumbosacral region: Secondary | ICD-10-CM | POA: Diagnosis not present

## 2021-07-28 DIAGNOSIS — C50912 Malignant neoplasm of unspecified site of left female breast: Secondary | ICD-10-CM | POA: Diagnosis not present

## 2021-07-28 DIAGNOSIS — H409 Unspecified glaucoma: Secondary | ICD-10-CM | POA: Diagnosis not present

## 2021-07-28 DIAGNOSIS — C4491 Basal cell carcinoma of skin, unspecified: Secondary | ICD-10-CM | POA: Diagnosis not present

## 2021-07-28 DIAGNOSIS — M1990 Unspecified osteoarthritis, unspecified site: Secondary | ICD-10-CM | POA: Diagnosis not present

## 2021-07-28 DIAGNOSIS — Z483 Aftercare following surgery for neoplasm: Secondary | ICD-10-CM | POA: Diagnosis not present

## 2021-07-29 ENCOUNTER — Ambulatory Visit: Payer: Medicare Other

## 2021-07-29 DIAGNOSIS — M47817 Spondylosis without myelopathy or radiculopathy, lumbosacral region: Secondary | ICD-10-CM | POA: Diagnosis not present

## 2021-07-29 DIAGNOSIS — C50912 Malignant neoplasm of unspecified site of left female breast: Secondary | ICD-10-CM | POA: Diagnosis not present

## 2021-07-29 DIAGNOSIS — C4491 Basal cell carcinoma of skin, unspecified: Secondary | ICD-10-CM | POA: Diagnosis not present

## 2021-07-29 DIAGNOSIS — Z483 Aftercare following surgery for neoplasm: Secondary | ICD-10-CM | POA: Diagnosis not present

## 2021-07-29 DIAGNOSIS — H409 Unspecified glaucoma: Secondary | ICD-10-CM | POA: Diagnosis not present

## 2021-07-29 DIAGNOSIS — M1990 Unspecified osteoarthritis, unspecified site: Secondary | ICD-10-CM | POA: Diagnosis not present

## 2021-07-31 ENCOUNTER — Ambulatory Visit: Payer: Medicare Other

## 2021-07-31 DIAGNOSIS — C50912 Malignant neoplasm of unspecified site of left female breast: Secondary | ICD-10-CM | POA: Diagnosis not present

## 2021-07-31 DIAGNOSIS — M1990 Unspecified osteoarthritis, unspecified site: Secondary | ICD-10-CM | POA: Diagnosis not present

## 2021-07-31 DIAGNOSIS — Z483 Aftercare following surgery for neoplasm: Secondary | ICD-10-CM | POA: Diagnosis not present

## 2021-07-31 DIAGNOSIS — H409 Unspecified glaucoma: Secondary | ICD-10-CM | POA: Diagnosis not present

## 2021-07-31 DIAGNOSIS — M47817 Spondylosis without myelopathy or radiculopathy, lumbosacral region: Secondary | ICD-10-CM | POA: Diagnosis not present

## 2021-07-31 DIAGNOSIS — C4491 Basal cell carcinoma of skin, unspecified: Secondary | ICD-10-CM | POA: Diagnosis not present

## 2021-08-05 ENCOUNTER — Ambulatory Visit (INDEPENDENT_AMBULATORY_CARE_PROVIDER_SITE_OTHER): Payer: Medicare Other | Admitting: Psychology

## 2021-08-05 ENCOUNTER — Ambulatory Visit: Payer: Medicare Other

## 2021-08-05 DIAGNOSIS — C50912 Malignant neoplasm of unspecified site of left female breast: Secondary | ICD-10-CM | POA: Diagnosis not present

## 2021-08-05 DIAGNOSIS — H409 Unspecified glaucoma: Secondary | ICD-10-CM | POA: Diagnosis not present

## 2021-08-05 DIAGNOSIS — M1990 Unspecified osteoarthritis, unspecified site: Secondary | ICD-10-CM | POA: Diagnosis not present

## 2021-08-05 DIAGNOSIS — F4323 Adjustment disorder with mixed anxiety and depressed mood: Secondary | ICD-10-CM

## 2021-08-05 DIAGNOSIS — Z483 Aftercare following surgery for neoplasm: Secondary | ICD-10-CM | POA: Diagnosis not present

## 2021-08-05 DIAGNOSIS — C4491 Basal cell carcinoma of skin, unspecified: Secondary | ICD-10-CM | POA: Diagnosis not present

## 2021-08-05 DIAGNOSIS — M47817 Spondylosis without myelopathy or radiculopathy, lumbosacral region: Secondary | ICD-10-CM | POA: Diagnosis not present

## 2021-08-07 ENCOUNTER — Ambulatory Visit: Payer: Medicare Other

## 2021-08-07 DIAGNOSIS — H401123 Primary open-angle glaucoma, left eye, severe stage: Secondary | ICD-10-CM | POA: Diagnosis not present

## 2021-08-07 DIAGNOSIS — H401111 Primary open-angle glaucoma, right eye, mild stage: Secondary | ICD-10-CM | POA: Diagnosis not present

## 2021-08-08 DIAGNOSIS — C50912 Malignant neoplasm of unspecified site of left female breast: Secondary | ICD-10-CM | POA: Diagnosis not present

## 2021-08-08 DIAGNOSIS — M1990 Unspecified osteoarthritis, unspecified site: Secondary | ICD-10-CM | POA: Diagnosis not present

## 2021-08-08 DIAGNOSIS — C4491 Basal cell carcinoma of skin, unspecified: Secondary | ICD-10-CM | POA: Diagnosis not present

## 2021-08-08 DIAGNOSIS — M47817 Spondylosis without myelopathy or radiculopathy, lumbosacral region: Secondary | ICD-10-CM | POA: Diagnosis not present

## 2021-08-08 DIAGNOSIS — H409 Unspecified glaucoma: Secondary | ICD-10-CM | POA: Diagnosis not present

## 2021-08-08 DIAGNOSIS — Z483 Aftercare following surgery for neoplasm: Secondary | ICD-10-CM | POA: Diagnosis not present

## 2021-08-11 ENCOUNTER — Telehealth (INDEPENDENT_AMBULATORY_CARE_PROVIDER_SITE_OTHER): Payer: Self-pay | Admitting: Vascular Surgery

## 2021-08-11 DIAGNOSIS — Z483 Aftercare following surgery for neoplasm: Secondary | ICD-10-CM | POA: Diagnosis not present

## 2021-08-11 DIAGNOSIS — H409 Unspecified glaucoma: Secondary | ICD-10-CM | POA: Diagnosis not present

## 2021-08-11 DIAGNOSIS — C50912 Malignant neoplasm of unspecified site of left female breast: Secondary | ICD-10-CM | POA: Diagnosis not present

## 2021-08-11 DIAGNOSIS — M47817 Spondylosis without myelopathy or radiculopathy, lumbosacral region: Secondary | ICD-10-CM | POA: Diagnosis not present

## 2021-08-11 DIAGNOSIS — M1990 Unspecified osteoarthritis, unspecified site: Secondary | ICD-10-CM | POA: Diagnosis not present

## 2021-08-11 DIAGNOSIS — C4491 Basal cell carcinoma of skin, unspecified: Secondary | ICD-10-CM | POA: Diagnosis not present

## 2021-08-11 NOTE — Telephone Encounter (Signed)
Patient prefer to see NP to get in sooner then what Dr. Delana Meyer schedule would allow.  Patient Scheduled for 8/24.

## 2021-08-11 NOTE — Telephone Encounter (Signed)
Patient called in stating she is having pain in both legs with her veins.  Skin is tight to the touch and hard for her to walk.  Patient is still using her leg pumps and compression socks without any relief.  She would like to see if she can schedule and appointment with Dr. Delana Meyer.  Please advise.

## 2021-08-11 NOTE — Telephone Encounter (Signed)
Reflux and ABIs with Dr. Delana Meyer per her request

## 2021-08-15 ENCOUNTER — Other Ambulatory Visit (INDEPENDENT_AMBULATORY_CARE_PROVIDER_SITE_OTHER): Payer: Self-pay | Admitting: Nurse Practitioner

## 2021-08-15 DIAGNOSIS — M79604 Pain in right leg: Secondary | ICD-10-CM

## 2021-08-19 ENCOUNTER — Ambulatory Visit (INDEPENDENT_AMBULATORY_CARE_PROVIDER_SITE_OTHER): Payer: Medicare Other | Admitting: Psychology

## 2021-08-19 DIAGNOSIS — F4323 Adjustment disorder with mixed anxiety and depressed mood: Secondary | ICD-10-CM

## 2021-08-20 ENCOUNTER — Encounter (INDEPENDENT_AMBULATORY_CARE_PROVIDER_SITE_OTHER): Payer: Self-pay | Admitting: Nurse Practitioner

## 2021-08-20 ENCOUNTER — Ambulatory Visit (INDEPENDENT_AMBULATORY_CARE_PROVIDER_SITE_OTHER): Payer: Medicare Other

## 2021-08-20 ENCOUNTER — Other Ambulatory Visit: Payer: Self-pay

## 2021-08-20 ENCOUNTER — Ambulatory Visit (INDEPENDENT_AMBULATORY_CARE_PROVIDER_SITE_OTHER): Payer: Medicare Other | Admitting: Nurse Practitioner

## 2021-08-20 VITALS — BP 163/81 | HR 69 | Ht 63.0 in | Wt 170.0 lb

## 2021-08-20 DIAGNOSIS — I890 Lymphedema, not elsewhere classified: Secondary | ICD-10-CM

## 2021-08-20 DIAGNOSIS — I872 Venous insufficiency (chronic) (peripheral): Secondary | ICD-10-CM

## 2021-08-20 DIAGNOSIS — M79605 Pain in left leg: Secondary | ICD-10-CM | POA: Diagnosis not present

## 2021-08-20 DIAGNOSIS — M79604 Pain in right leg: Secondary | ICD-10-CM

## 2021-08-20 DIAGNOSIS — I10 Essential (primary) hypertension: Secondary | ICD-10-CM

## 2021-08-20 NOTE — Progress Notes (Signed)
Subjective:    Patient ID: Beth Rodgers, female    DOB: 02/05/1935, 86 y.o.   MRN: [id number] Chief Complaint  Patient presents with   Follow-up    Add on per telephone note , pt perfer to see NP to get in quicker     Beth Rodgers is an 86 year old female that presents today due to pain in her lower extremities.  The patient is primarily concerned that she may have issues with her circulation.  The patient notes that she has had leg problems for long periods.  She notes that at night she will begin to have a pain in her legs that is helped by her lymph pump.  She does note using the pump regularly.  She also complains of dryness and itching in her skin.  Sometimes her legs feel cold as well.  She denies any open wounds or ulcerations.  Today noninvasive studies show an ABI of 1.12 on the right and 1.07 on the left.  She has a TBI of 0.95 bilaterally.  There is no evidence of significant lower extremity arterial disease noted bilaterally.  Her toe waveforms are good.  Patient also underwent bilateral lower extremity venous reflux studies which show evidence of stripped great saphenous veins bilaterally.  No evidence of DVT or superficial thrombophlebitis seen bilaterally.  No evidence of deep venous insufficiency seen bilaterally.  No evidence of superficial venous reflux seen in the bilateral short saphenous veins.   Review of Systems  Cardiovascular:  Positive for leg swelling.  Musculoskeletal:  Positive for arthralgias and myalgias.  All other systems reviewed and are negative.     Objective:   Physical Exam Vitals reviewed.  HENT:     Head: Normocephalic.  Cardiovascular:     Rate and Rhythm: Normal rate.     Pulses: Normal pulses.  Pulmonary:     Effort: Pulmonary effort is normal.  Musculoskeletal:     Right lower leg: Edema present.     Left lower leg: Edema present.  Skin:    General: Skin is warm and dry.     Capillary Refill: Capillary refill takes less than 2  seconds.  Neurological:     Mental Status: She is alert and oriented to person, place, and time.     Motor: Weakness present.     Gait: Gait abnormal.  Psychiatric:        Mood and Affect: Mood normal.        Behavior: Behavior normal.        Thought Content: Thought content normal.        Judgment: Judgment normal.    BP (!) 163/81   Pulse 69   Ht '5\' 3"'$  (1.6 m)   Wt 170 lb (77.1 kg)   BMI 30.11 kg/m   Past Medical History:  Diagnosis Date   Basal cell carcinoma (BCC)    txted with MOHs in past   Cellulitis 08/10/2018   Chronic venous insufficiency    with varicose veins   Family history of breast cancer    Family history of thyroid cancer    GERD (gastroesophageal reflux disease)    Glaucoma    History  of basal cell carcinoma    left nare/BREAST CANCER   History of chicken pox    Hypertension    Hypoglycemia    Hypothyroid    Osteoarthritis    back pain, L knee pain   Osteopenia 06/2009   DEXA 11/2015: T -2.3 hip, -2.2 spine, on  longterm bisphosphonate/evista   Psoriasis    Pyogenic granuloma 04/16/2016    Social History   Socioeconomic History   Marital status: Married    Spouse name: Not on file   Number of children: 2   Years of education: Not on file   Highest education level: Not on file  Occupational History    Employer: RETIRED  Tobacco Use   Smoking status: Never   Smokeless tobacco: Never  Vaping Use   Vaping Use: Never used  Substance and Sexual Activity   Alcohol use: Yes    Comment: Occasionally drinks wine   Drug use: No   Sexual activity: Not Currently    Birth control/protection: Post-menopausal  Other Topics Concern   Not on file  Social History Narrative   From [place], [place]   Caffeine: 2-3 cups/day   Lives with husband, majority of time alone as he travels to [place].   Has grandchildren nearby.   Occ: Field seismologist, Metallurgist   Activity: bed exercises, limiting walking   Diet: does get fruits and vegetables,  only some water   Social Determinants of Radio broadcast assistant Strain: Low Risk    Difficulty of Paying Living Expenses: Not hard at all  Food Insecurity: No Food Insecurity   Worried About Charity fundraiser in the Last Year: Never true   Arboriculturist in the Last Year: Never true  Transportation Needs: No Transportation Needs   Lack of Transportation (Medical): No   Lack of Transportation (Non-Medical): No  Physical Activity: Sufficiently Active   Days of Exercise per Week: 7 days   Minutes of Exercise per Session: 30 min  Stress: No Stress Concern Present   Feeling of Stress : Not at all  Social Connections: Not on file  Intimate Partner Violence: Not At Risk   Fear of Current or Ex-Partner: No   Emotionally Abused: No   Physically Abused: No   Sexually Abused: No    Past Surgical History:  Procedure Laterality Date   BASAL CELL CARCINOMA EXCISION     located on face   BREAST BIOPSY Left    core done ing [place]?   BREAST BIOPSY Left 08/16/2020   3 area bx 4:00 X, IMC, 6:00Q IMC, LN hydro #3-benign   BREAST LUMPECTOMY Left 09/11/2020   two areas of [facility]   CATARACT EXTRACTION     bilateral   DEXA  06/2009   T score Spine: -1.8, hip -2.0   EXCISION OF BREAST BIOPSY Left 09/11/2020   Procedure: EXCISION OF BREAST BIOPSY;  Surgeon: [name], MD;  Location: [facility] ORS;  Service: General;  Laterality: Left;   Foam sclerotherapy Bilateral 09/2015   [place], [place]   MASTECTOMY W/ SENTINEL NODE BIOPSY Left 06/25/2021   Procedure: MASTECTOMY WITH SENTINEL LYMPH NODE BIOPSY;  Surgeon: [name], MD;  Location: [facility];  Service: General;  Laterality: Left;   NASAL RECONSTRUCTION  2005   for [place] L nare s/p Mohs   nuclear stress test  2014   normal in [place] BX Left 09/11/2020   Procedure: PARTIAL MASTECTOMY WITH NEEDLE LOCALIZATION AND AXILLARY SENTINEL LYMPH  NODE [place];  Surgeon: [name], MD;  Location: [facility];  Service: General;  Laterality: Left;   RADIOFREQUENCY ABLATION Left 01/2012   remnant L G saphenous vein below knee   RADIOFREQUENCY ABLATION Right 02/2013   RLE vein ablation  VEIN LIGATION AND STRIPPING  remote   bilateral G saphenous veins    Family History  Problem Relation Age of Onset   Cancer Mother        thyroid cancer   Heart disease Father        CHF   Diabetes Father    Glaucoma Father    Arthritis Father    Coronary artery disease Maternal Uncle    Bipolar disorder Son    Heart disease Sister        CHF   Stroke Sister    Brain cancer Grandson        astrocytoma   Breast cancer Cousin        dx 19s    Allergies  Allergen Reactions   Anastrozole Other (See Comments)    Back pain    Exemestane     Pain in right hand    Naproxen Hives    Had bumps break out on the back     CBC Latest Ref Rng & Units 06/02/2021 01/04/2021 08/21/2020  WBC 4.0 - 10.5 K/uL 6.3 7.7 7.4  Hemoglobin 12.0 - 15.0 g/dL 13.9 13.4 13.7  Hematocrit 36.0 - 46.0 % 40.9 41.2 40.6  Platelets 150.0 - 400.0 K/uL 230.0 226 221      CMP     Component Value Date/Time   NA 139 06/02/2021 1227   K 4.3 06/02/2021 1227   CL 102 06/02/2021 1227   CO2 27 06/02/2021 1227   GLUCOSE 82 06/02/2021 1227   BUN 19 06/02/2021 1227   CREATININE 0.72 06/02/2021 1227   CALCIUM 9.8 06/02/2021 1227   PROT 7.4 08/21/2020 1625   ALBUMIN 4.3 08/21/2020 1625   AST 19 08/21/2020 1625   ALT 16 08/21/2020 1625   ALKPHOS 82 08/21/2020 1625   BILITOT 0.4 08/21/2020 1625   GFRNONAA >60 01/04/2021 0814   GFRAA >60 08/21/2020 1625     No results found.     Assessment & Plan:   1. Bilateral leg pain Based upon the patient's noninvasive test today her pain and discomfort in her legs are not related to vascular causes.  Based on the description of her pain it is suggestive that it may be related to neuropathy and/or restless leg syndrome.   We will place referral to neurology for further evaluation.  If neurology is unable to determine the cause of lower extremity pain the patient should see her primary care physician for further work-up. - Ambulatory referral to Neurology  2. Lymphedema  No surgery or intervention at this point in time.    I have reviewed my discussion with the patient regarding lymphedema and why it  causes symptoms.  Patient will continue wearing graduated compression stockings class 1 (20-30 mmHg) on a daily basis a prescription was given. The patient is reminded to put the stockings on first thing in the morning and removing them in the evening. The patient is instructed specifically not to sleep in the stockings.   In addition, behavioral modification throughout the day will be continued.  This will include frequent elevation (such as in a recliner), use of over the counter pain medications as needed and exercise such as walking.  I have reviewed systemic causes for chronic edema such as liver, kidney and cardiac etiologies and there does not appear to be any significant changes in these organ systems over the past year.  The patient is under the impression that these organ systems are all stable and unchanged.  The patient will continue aggressive use of the  lymph pump.  This will continue to improve the edema control and prevent sequela such as ulcers and infections.   The patient will follow-up with me on an annual basis.    3. Chronic venous insufficiency Based on noninvasive studies there is no further role for intervention.  Patient should continue with conservative therapies as noted above.  4. Essential hypertension Continue antihypertensive medications as already ordered, these medications have been reviewed and there are no changes at this time.    Current Outpatient Medications on File Prior to Visit  Medication Sig Dispense Refill   alendronate (FOSAMAX) 70 MG tablet Take 70 mg by mouth  every Sunday.     Calcium Carb-Cholecalciferol (CALCIUM 600 + D PO) Take 1 tablet by mouth daily.     calcium carbonate (TUMS EX) 750 MG chewable tablet Chew 750 mg by mouth daily as needed for heartburn.     Cholecalciferol (VITAMIN D) 50 MCG (2000 UT) CAPS Take 1 capsule (2,000 Units total) by mouth daily. 30 capsule    Ciclopirox 0.77 % gel Apply 1 application topically 2 (two) times daily as needed (fungus).     clobetasol cream (TEMOVATE) [id number] % Apply 1 application topically as directed. Qd to bid up to 5 days a week to aa psoriasis on body until clear, then prn flares, avoid face, groin, axilla (Patient taking differently: Apply 1 application topically 2 (two) times daily.) 60 g 2   diclofenac sodium (VOLTAREN) 1 % GEL Apply 1 application topically 3 (three) times daily. (Patient taking differently: Apply 1 application topically 3 (three) times daily as needed (pain).) 1 Tube 0   levothyroxine (SYNTHROID) 75 MCG tablet Take 1 tablet (75 mcg total) by mouth daily. 90 tablet 3   losartan (COZAAR) 100 MG tablet TAKE 1 TABLET BY MOUTH EVERY DAY 90 tablet 0   Magnesium 400 MG TABS Take 400 mg by mouth daily.     Multiple Vitamin (MULTIVITAMIN) tablet Take 1 tablet by mouth daily.     NITROSTAT 0.4 MG SL tablet Place 0.4 mg under the tongue every 5 (five) minutes as needed for chest pain.      predniSONE (DELTASONE) 20 MG tablet 2 tabs po daily for 5 days, then 1 tab po daily for 5 days 15 tablet 0   silver sulfADIAZINE (SILVADENE) 1 % cream Apply 1 application topically daily. (Patient taking differently: Apply 1 application topically 2 (two) times daily.) 50 g 2   tacrolimus (PROTOPIC) 0.1 % ointment APPLY TOPICALLY TO AFFECTED AREA(S) TWICE DAILY 30 g 0   Travoprost, BAK Free, (TRAVATAN) 0.004 % SOLN ophthalmic solution Place 1 drop into both eyes at bedtime.     vitamin C (ASCORBIC ACID) 500 MG tablet Take 500 mg by mouth daily.     vitamin E 180 MG (400 UNITS) capsule Take 400 Units by mouth  daily.     No current facility-administered medications on file prior to visit.    There are no Patient Instructions on file for this visit. No follow-ups on file.   Kris Hartmann, NP

## 2021-08-21 DIAGNOSIS — H401123 Primary open-angle glaucoma, left eye, severe stage: Secondary | ICD-10-CM | POA: Diagnosis not present

## 2021-08-24 ENCOUNTER — Other Ambulatory Visit: Payer: Self-pay | Admitting: Family Medicine

## 2021-08-25 ENCOUNTER — Ambulatory Visit: Payer: Medicare Other

## 2021-08-26 ENCOUNTER — Ambulatory Visit (INDEPENDENT_AMBULATORY_CARE_PROVIDER_SITE_OTHER): Payer: Medicare Other | Admitting: Dermatology

## 2021-08-26 ENCOUNTER — Other Ambulatory Visit: Payer: Self-pay

## 2021-08-26 DIAGNOSIS — L409 Psoriasis, unspecified: Secondary | ICD-10-CM

## 2021-08-26 DIAGNOSIS — L820 Inflamed seborrheic keratosis: Secondary | ICD-10-CM | POA: Diagnosis not present

## 2021-08-26 DIAGNOSIS — I872 Venous insufficiency (chronic) (peripheral): Secondary | ICD-10-CM

## 2021-08-26 DIAGNOSIS — B353 Tinea pedis: Secondary | ICD-10-CM

## 2021-08-26 DIAGNOSIS — L304 Erythema intertrigo: Secondary | ICD-10-CM | POA: Diagnosis not present

## 2021-08-26 DIAGNOSIS — I8393 Asymptomatic varicose veins of bilateral lower extremities: Secondary | ICD-10-CM

## 2021-08-26 NOTE — Patient Instructions (Addendum)
Ciclopirox cream to area on chest once a day until itchy rash improved.  tacrolimus 0.1% ointment to area on chest once a day until itchy rash improved. May also use on ears.  Fluticasone cream Apply to affected areas behind and in ears 1-2 times a day as needed for itchy flares.   Topical steroids (such as triamcinolone, fluocinolone, fluocinonide, mometasone, clobetasol, halobetasol, betamethasone, hydrocortisone) can cause thinning and lightening of the skin if they are used for too long in the same area. Your physician has selected the right strength medicine for your problem and area affected on the body. Please use your medication only as directed by your physician to prevent side effects.    If you have any questions or concerns for your doctor, please call our main line at [phone] and press option 4 to reach your doctor's medical assistant. If no one answers, please leave a voicemail as directed and we will return your call as soon as possible. Messages left after 4 pm will be answered the following business day.   You may also send [place] a message via [place]. We typically respond to MyChart messages within 1-2 business days.  For prescription refills, please ask your pharmacy to contact our office. Our fax number is [phone].  If you have an urgent issue when the clinic is closed that cannot wait until the next business day, you can page your doctor at the number below.    Please note that while we do our best to be available for urgent issues outside of office hours, we are not available 24/7.   If you have an urgent issue and are unable to reach [place], you may choose to seek medical care at your doctor's office, retail clinic, urgent care center, or emergency room.  If you have a medical emergency, please immediately call 911 or go to the emergency department.  Pager Numbers  - Dr. Nehemiah Massed: [phone]  - Dr. Laurence Ferrari: [phone]  - Dr. Nicole Kindred: [phone]  In the  event of inclement weather, please call our main line at [phone] for an update on the status of any delays or closures.  Dermatology Medication Tips: Please keep the boxes that topical medications come in in order to help keep track of the instructions about where and how to use these. Pharmacies typically print the medication instructions only on the boxes and not directly on the medication tubes.   If your medication is too expensive, please contact our office at [phone] option 4 or send [place] a message through [place].   We are unable to tell what your co-pay for medications will be in advance as this is different depending on your insurance coverage. However, we may be able to find a substitute medication at lower cost or fill out paperwork to get insurance to cover a needed medication.   If a prior authorization is required to get your medication covered by your insurance company, please allow [place] 1-2 business days to complete this process.  Drug prices often vary depending on where the prescription is filled and some pharmacies may offer cheaper prices.  The website www.goodrx.com contains coupons for medications through different pharmacies. The prices here do not account for what the cost may be with help from insurance (it may be cheaper with your insurance), but the website can give you the price if you did not use any insurance.  - You can print the associated coupon and take it with your prescription to the pharmacy.  - You  may also stop by our office during regular business hours and pick up a GoodRx coupon card.  - If you need your prescription sent electronically to a different pharmacy, notify our office through [facility] or by phone at [phone] option 4.

## 2021-08-26 NOTE — Progress Notes (Signed)
Follow-Up Visit   Subjective  Beth Rodgers is a 86 y.o. female who presents for the following: Follow-up (Patient here for 4 month follow-up.). She has psoriasis and is using clobetasol cream, tacrolimus ointment, and TMC 0.1% cream. She has stasis dermatitis with lipodermatosclerosis. She uses Silvadene cream and tacrolimus ointment. Tinea pedia of the feet and she uses ciclopirox cream. Patient had a mastectomy on 06/25/2021 and has an itchy spot near area. Overall, she feels like her skin conditions are somewhat improved/controlled, but not clear.  She has a few itchy bumps that don't clear up with topical clobetasol on her back and arm.  The following portions of the chart were reviewed this encounter and updated as appropriate:       Review of Systems:  No other skin or systemic complaints except as noted in HPI or Assessment and Plan.  Objective  Well appearing patient in no apparent distress; mood and affect are within normal limits.  A focused examination was performed including face, trunk, extremities. Relevant physical exam findings are noted in the Assessment and Plan.  Left lateral inframammary fold Mild erythema with small pink papules  lower legs hyperpigmented, indurated plaques involving the ankle and distal lower leg with associated lower leg woody edema.     arms, back, postauricular Pink scaly patches on back, elbows, arms, sacral area, post auricular  spinal lower back x 1, L elbow x 1, L upper arm x 2 (4) Erythematous keratotic or waxy stuck-on papule or plaque.   feet Mild scaling of plantar feet and heels, toenail dystrophy   Assessment & Plan  Erythema intertrigo Left lateral inframammary fold  Intertrigo is a chronic recurrent rash that occurs in skin fold areas that may be associated with friction; heat; moisture; yeast; fungus; and bacteria.  It is exacerbated by increased movement / activity; sweating; and higher atmospheric temperature.  Start  Ciclopirox cream to AA QD - pt has Start tacrolimus 0.1% ointment to AA QD - pt has   Stasis dermatitis of both legs lower legs  With Lipodermatosclerosis- Chronic condition, no cure, controlled on current therapy   Cont Silvadene cream apply to aas feet/ankles daily  Cont Tacrolimus ointment qd-bid prn itchy rash Cont Support stocking daily  Stasis in the legs causes chronic leg swelling, which may result in itchy or painful rashes, skin discoloration, skin texture changes, and sometimes ulceration.  Recommend daily compression hose/stockings- easiest to put on first thing in morning, remove at bedtime.  Elevate legs as much as possible. Avoid salt/sodium rich foods.       .   Psoriasis arms, back, postauricular  Some improvement, not at goal  Psoriasis is a chronic non-curable, but treatable genetic/hereditary disease that may have other systemic features affecting other organ systems such as joints (Psoriatic Arthritis). It is associated with an increased risk of inflammatory bowel disease, heart disease, non-alcoholic fatty liver disease, and depression.    Start Fluticasone cream Apply to AA post auricular and ear canal qd/bid prn flares. Pt has.  Cont Clobetasol cream apply to severe areas of Psoriasis on the body prn  Cont Tacrolimus ointment apply to less severe areas of Psoriasis on the face/ears/body daily  Cont Triamcinolone cream apply to less severe areas of Psoriasis on the body prn   Topical steroids (such as triamcinolone, fluocinolone, fluocinonide, mometasone, clobetasol, halobetasol, betamethasone, hydrocortisone) can cause thinning and lightening of the skin if they are used for too long in the same area. Your physician has selected  the right strength medicine for your problem and area affected on the body. Please use your medication only as directed by your physician to prevent side effects.    Related Medications clobetasol cream (TEMOVATE) [id number] % Apply 1  application topically as directed. Qd to bid up to 5 days a week to aa psoriasis on body until clear, then prn flares, avoid face, groin, axilla  Inflamed seborrheic keratosis spinal lower back x 1, L elbow x 1, L upper arm x 2  Destruction of lesion - spinal lower back x 1, L elbow x 1, L upper arm x 2  Destruction method: cryotherapy   Informed consent: discussed and consent obtained   Lesion destroyed using liquid nitrogen: Yes   Region frozen until ice ball extended beyond lesion: Yes   Outcome: patient tolerated procedure well with no complications   Post-procedure details: wound care instructions given   Additional details:  Prior to procedure, discussed risks of blister formation, small wound, skin dyspigmentation, or rare scar following cryotherapy. Recommend Vaseline ointment to treated areas while healing.   Tinea pedis of both feet feet  Tinea pedis/unguium- improving  Continue ciclopirox cream qd Apply to feet, around toenails, and between toes. Pt has Rx.  Return if symptoms worsen or fail to improve. Patient will call back for next appt due to possible upcoming extended trip to [place] to visit family. Will send in RX rfs if needed prior to trip.  IJamesetta Orleans, CMA, am acting as scribe for [name], MD .  Documentation: I have reviewed the above documentation for accuracy and completeness, and I agree with the above.  [name] MD

## 2021-08-28 ENCOUNTER — Ambulatory Visit: Payer: Medicare Other

## 2021-08-28 ENCOUNTER — Ambulatory Visit: Payer: Medicare Other | Attending: Family Medicine

## 2021-08-28 DIAGNOSIS — M542 Cervicalgia: Secondary | ICD-10-CM | POA: Insufficient documentation

## 2021-08-28 DIAGNOSIS — M79672 Pain in left foot: Secondary | ICD-10-CM | POA: Insufficient documentation

## 2021-08-28 DIAGNOSIS — R262 Difficulty in walking, not elsewhere classified: Secondary | ICD-10-CM | POA: Insufficient documentation

## 2021-09-02 ENCOUNTER — Ambulatory Visit (INDEPENDENT_AMBULATORY_CARE_PROVIDER_SITE_OTHER): Payer: Medicare Other | Admitting: Psychology

## 2021-09-02 DIAGNOSIS — F4323 Adjustment disorder with mixed anxiety and depressed mood: Secondary | ICD-10-CM

## 2021-09-03 ENCOUNTER — Ambulatory Visit: Payer: Medicare Other

## 2021-09-04 ENCOUNTER — Ambulatory Visit: Payer: Medicare Other

## 2021-09-04 ENCOUNTER — Encounter: Payer: Self-pay | Admitting: Family Medicine

## 2021-09-04 ENCOUNTER — Ambulatory Visit (INDEPENDENT_AMBULATORY_CARE_PROVIDER_SITE_OTHER): Payer: Medicare Other | Admitting: Family Medicine

## 2021-09-04 ENCOUNTER — Other Ambulatory Visit: Payer: Self-pay

## 2021-09-04 VITALS — BP 140/76 | HR 78 | Temp 97.2°F | Ht 63.0 in | Wt 171.0 lb

## 2021-09-04 DIAGNOSIS — M1712 Unilateral primary osteoarthritis, left knee: Secondary | ICD-10-CM | POA: Diagnosis not present

## 2021-09-04 DIAGNOSIS — M542 Cervicalgia: Secondary | ICD-10-CM | POA: Diagnosis not present

## 2021-09-04 DIAGNOSIS — M79672 Pain in left foot: Secondary | ICD-10-CM | POA: Diagnosis not present

## 2021-09-04 DIAGNOSIS — R262 Difficulty in walking, not elsewhere classified: Secondary | ICD-10-CM | POA: Diagnosis not present

## 2021-09-04 NOTE — Progress Notes (Signed)
Beth Alfred T. Ambria Mayfield, MD, [name] at [facility] Beth Rodgers [place], [address]  Phone: [phone]  FAX: [phone]  JACKYE MULVEY - 86 y.o. female  MRN [id number]  Date of Birth: Dec 06, 1935  Date: 09/04/2021  PCP: Ria Bush, MD  Referral: Ria Bush, MD  Chief Complaint  Patient presents with  . Knee Pain    Left    This visit occurred during the SARS-CoV-2 public health emergency.  Safety protocols were in place, including screening questions prior to the visit, additional usage of staff PPE, and extensive cleaning of exam room while observing appropriate contact time as indicated for disinfecting solutions.   Subjective:   Beth Rodgers is a 86 y.o. very pleasant female patient with Body mass index is 30.29 kg/m. who presents with the following:  86 year old, follow-up left knee pain.  Radiographs reviewed from June 2022, she does have notable tricompartmental arthritis, and in part it is severe at the patellofemoral joint, with global moderate arthritis in general.  Her knee pain is been progressive over 30 years. On her last office visit I did do a intra-articular injection on the left knee.  Had been doing some PT and she did ok with it.  Rotation movement will hurt a lot.  She has been treated for breast cancer recently. Recent mastectomy.  Now is feeling worse compared to a few months ago.  Has to be careful even with driving.    She asks about her achilles again.  This is been chronic in character, and she has seen multiple foot and ankle surgeons including Dr. Doran Durand.  This is been present for several years.    Review of Systems is noted in the HPI, as appropriate   Patient Active Problem List   Diagnosis Date Noted  . Goals of care, counseling/discussion 06/25/2021  . Breast cancer ([place]) 06/25/2021  . Pre-op evaluation 06/02/2021  . Right wrist pain 02/28/2021  . Skin rash 02/28/2021   . Ascending aorta dilatation (HCC) 01/10/2021  . Left-sided chest pain 01/10/2021  . Genetic testing 10/30/2020  . Malignant neoplasm of overlapping sites of left breast in female, estrogen receptor positive ([place]) 10/22/2020  . Family history of breast cancer   . Family history of thyroid cancer   . Insertional Achilles tendinopathy 08/28/2020  . Breast cancer, left breast ([place]) 08/01/2020  . Right carotid bruit 06/05/2020  . Chest pressure 06/03/2020  . Left Achilles tendinitis 09/01/2019  . Low back pain 08/29/2019  . Lumbosacral spondylosis without myelopathy 08/29/2019  . Calcaneal spur 08/17/2019  . Contusion of knee 10/04/2018  . Varicose veins of bilateral lower extremities with pain 03/30/2018  . Lymphedema 03/30/2018  . Dizziness 02/02/2017  . Situational depression 12/18/2016  . Vitamin D deficiency 10/11/2016  . Fatigue 08/12/2016  . 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 Essential hypertension 12/17/2015  . Dermatitis of external ear 12/17/2015  . Corn of foot 05/16/2015  . Advanced care planning/counseling discussion 10/30/2014  . Neck pain on right side 06/25/2014  . Primary osteoarthritis of left knee 08/17/2013  . Oropharyngeal dysphagia 11/15/2012  . Stressful life events affecting family and household 03/30/2012  . Medicare annual wellness visit, subsequent 03/30/2012  . History of basal cell carcinoma   . Osteoarthritis   . Osteopenia   . Hypothyroidism   . Glaucoma   . Chronic venous insufficiency     Past Medical History:  Diagnosis Date  .  Basal cell carcinoma (BCC)    txted with MOHs in past  . Cellulitis 08/10/2018  . Chronic venous insufficiency    with varicose veins  . Family history of breast cancer   . Family history of thyroid cancer   . GERD (gastroesophageal reflux disease)   . Glaucoma   . History  of basal cell carcinoma    left nare/BREAST CANCER  . History of chicken pox   . Hypertension    . Hypoglycemia   . Hypothyroid   . Osteoarthritis    back pain, L knee pain  . Osteopenia 06/2009   DEXA 11/2015: T -2.3 hip, -2.2 spine, on longterm bisphosphonate/evista  . Psoriasis   . Pyogenic granuloma 04/16/2016    Past Surgical History:  Procedure Laterality Date  . BASAL CELL CARCINOMA EXCISION     located on face  . BREAST BIOPSY Left    core done ing [place]?  Marland Kitchen BREAST BIOPSY Left 08/16/2020   3 area bx 4:00 X, IMC, 6:00Q IMC, LN hydro #3-benign  . BREAST LUMPECTOMY Left 09/11/2020   two areas of [facility]  . CATARACT EXTRACTION     bilateral  . DEXA  06/2009   T score Spine: -1.8, hip -2.0  . EXCISION OF BREAST BIOPSY Left 09/11/2020   Procedure: EXCISION OF BREAST BIOPSY;  Surgeon: [name], MD;  Location: [facility] ORS;  Service: General;  Laterality: Left;  . Foam sclerotherapy Bilateral 09/2015   Reed [place], [place]  . MASTECTOMY W/ SENTINEL NODE BIOPSY Left 06/25/2021   Procedure: MASTECTOMY WITH SENTINEL LYMPH NODE BIOPSY;  Surgeon: [name], MD;  Location: [facility] ORS;  Service: General;  Laterality: Left;  . NASAL RECONSTRUCTION  2005   for BCC L nare s/p Mohs  . nuclear stress test  2014   normal in [place]  . PARTIAL MASTECTOMY WITH NEEDLE LOCALIZATION AND AXILLARY SENTINEL LYMPH NODE BX Left 09/11/2020   Procedure: PARTIAL MASTECTOMY WITH NEEDLE LOCALIZATION AND AXILLARY SENTINEL LYMPH NODE BX;  Surgeon: [name], MD;  Location: [facility] ORS;  Service: General;  Laterality: Left;  . RADIOFREQUENCY ABLATION Left 01/2012   remnant L G saphenous vein below knee  . RADIOFREQUENCY ABLATION Right 02/2013   RLE vein ablation   . VEIN LIGATION AND STRIPPING  remote   bilateral G saphenous veins    Family History  Problem Relation Age of Onset  . Cancer Mother        thyroid cancer  . Heart disease Father        CHF  . Diabetes Father   . Glaucoma Father   . Arthritis Father   . Coronary artery disease Maternal Uncle   .  Bipolar disorder Son   . Heart disease Sister        CHF  . Stroke Sister   . Brain cancer Grandson        astrocytoma  . Breast cancer Cousin        dx 80s     Objective:   BP 140/76   Pulse 78   Temp (!) 97.2 F (36.2 C) (Temporal)   Ht '5\' 3"'$  (1.6 m)   Wt 171 lb (77.6 kg)   SpO2 98%   BMI 30.29 kg/m   Left knee: She lacks 3 degrees of extension and she can flex to approximately 107 degrees.  She is very tender on the medial and lateral joint lines and she is very tender with compression at the patella.  She also has tender throughout  basically all of the needed deep palpation.  She has quite a bit of difficulty standing from a seated position.  ACL, PCL, MCL, LCL are all stable.  Forced flexion as well as McMurray's testing causes pain without mechanical symptoms.  Tibia and fibula are grossly nontender with the exception of the proximal.  Hip range of motion is grossly unremarkable.    Radiology: CLINICAL DATA:  Chronic left knee pain   EXAM: LEFT KNEE 3 VIEWS   COMPARISON:  None.   FINDINGS: There is tricompartment osteophyte formation with moderate lateral and severe patellofemoral joint space narrowing. Small joint effusion. No evidence of acute fracture.   IMPRESSION: Tricompartment osteoarthritis of the left knee, with moderate lateral and severe patellofemoral joint space narrowing. Small effusion.     Electronically Signed   By: Maurine Simmering   On: 06/02/2021 16:59  Assessment and Plan:     ICD-10-CM   1. Primary osteoarthritis of left knee  M17.12 Ambulatory referral to Orthopedic Surgery     Total encounter time: 38 minutes. This includes total time spent on the day of encounter.  I reviewed the patient's films with her again in June 2022.  Clinically, her symptoms progressed and she has diffuse impairing knee pain on the left side.  This is bothering her every day and it has become an impediment to her quality of life with virtually everything  that she is doing.  Her movement is poor.  We also spent additional time talking about chronic Achilles tendinopathy.  She has seen multiple orthopedic foot and ankle surgeons, and unfortunately she has had some persistent symptoms for several years.  I do not think that there are some good solutions in this case.  I am going to set her up to speak with total joint surgeon and the context of consideration for definitive management.  I appreciate their assistance.   Medications Discontinued During This Encounter  Medication Reason  . predniSONE (DELTASONE) 20 MG tablet Completed Course  . latanoprost (XALATAN) 0.005 % ophthalmic solution Duplicate   Orders Placed This Encounter  Procedures  . Ambulatory referral to Orthopedic Surgery    Follow-up: No follow-ups on file.  Dr[employer] One speech-to-text software was used for transcription in this dictation.  Possible transcriptional errors can occur using Editor, commissioning.   Signed,  Maud Deed. Katelen Luepke, MD   Outpatient Encounter Medications as of 09/04/2021  Medication Sig  . alendronate (FOSAMAX) 70 MG tablet Take 70 mg by mouth every Sunday.  . Calcium Carb-Cholecalciferol (CALCIUM 600 + D PO) Take 1 tablet by mouth daily.  . calcium carbonate (TUMS EX) 750 MG chewable tablet Chew 750 mg by mouth daily as needed for heartburn.  . Cholecalciferol (VITAMIN D) 50 MCG (2000 UT) CAPS Take 1 capsule (2,000 Units total) by mouth daily.  . Ciclopirox 0.77 % gel Apply 1 application topically 2 (two) times daily as needed (fungus).  . clobetasol cream (TEMOVATE) [id number] % Apply 1 application topically as directed. Qd to bid up to 5 days a week to aa psoriasis on body until clear, then prn flares, avoid face, groin, axilla (Patient taking differently: Apply 1 application topically 2 (two) times daily.)  . diclofenac sodium (VOLTAREN) 1 % GEL Apply 1 application topically 3 (three) times daily. (Patient taking differently: Apply 1 application  topically 3 (three) times daily as needed (pain).)  . levothyroxine (SYNTHROID) 75 MCG tablet Take 1 tablet (75 mcg total) by mouth daily.  Marland Kitchen losartan (COZAAR) 100 MG tablet  TAKE 1 TABLET BY MOUTH EVERY DAY  . Magnesium 400 MG TABS Take 400 mg by mouth daily.  . Multiple Vitamin (MULTIVITAMIN) tablet Take 1 tablet by mouth daily.  Marland Kitchen NITROSTAT 0.4 MG SL tablet Place 0.4 mg under the tongue every 5 (five) minutes as needed for chest pain.   . silver sulfADIAZINE (SILVADENE) 1 % cream Apply 1 application topically daily. (Patient taking differently: Apply 1 application topically 2 (two) times daily.)  . tacrolimus (PROTOPIC) 0.1 % ointment APPLY TOPICALLY TO AFFECTED AREA(S) TWICE DAILY  . Travoprost, BAK Free, (TRAVATAN) 0.004 % SOLN ophthalmic solution Place 1 drop into both eyes at bedtime.  . vitamin C (ASCORBIC ACID) 500 MG tablet Take 500 mg by mouth daily.  . vitamin E 180 MG (400 UNITS) capsule Take 400 Units by mouth daily.  . [DISCONTINUED] latanoprost (XALATAN) 0.005 % ophthalmic solution SMARTSIG:1 Drop(s) In Eye(s) Every Evening  . [DISCONTINUED] predniSONE (DELTASONE) 20 MG tablet 2 tabs po daily for 5 days, then 1 tab po daily for 5 days   No facility-administered encounter medications on file as of 09/04/2021.

## 2021-09-04 NOTE — Therapy (Signed)
[facility] [address], [place], [address] Phone: [phone]   Fax:  [phone]  Physical Therapy Evaluation  Patient Details  Name: Beth Rodgers MRN: [id number] Date of Birth: 12-07-1935 Referring Provider (PT): Owens Loffler, MD  Encounter Date: 09/04/2021   PT End of Session - 09/04/21 1501     Visit Number 1    Number of Visits 17    Date for PT Re-Evaluation 10/30/21    Authorization Type 1    Authorization Time Period 10    PT Start Time 1501    PT Stop Time 1548    PT Time Calculation (min) 47 min    Activity Tolerance Patient tolerated treatment well    Behavior During Therapy [facility] for tasks assessed/performed             Past Medical History:  Diagnosis Date   Basal cell carcinoma ([place])    txted with MOHs in past   Cellulitis 08/10/2018   Chronic venous insufficiency    with varicose veins   Family history of breast cancer    Family history of thyroid cancer    GERD (gastroesophageal reflux disease)    Glaucoma    History  of basal cell carcinoma    left nare/BREAST CANCER   History of chicken pox    Hypertension    Hypoglycemia    Hypothyroid    Osteoarthritis    back pain, L knee pain   Osteopenia 06/2009   DEXA 11/2015: T -2.3 hip, -2.2 spine, on longterm bisphosphonate/evista   Psoriasis    Pyogenic granuloma 04/16/2016    Past Surgical History:  Procedure Laterality Date   BASAL CELL CARCINOMA EXCISION     located on face   BREAST BIOPSY Left    core done ing [place]?   BREAST BIOPSY Left 08/16/2020   3 area bx 4:00 X, IMC, 6:00Q IMC, LN hydro #3-benign   BREAST LUMPECTOMY Left 09/11/2020   two areas of [facility]   CATARACT EXTRACTION     bilateral   DEXA  06/2009   T score Spine: -1.8, hip -2.0   EXCISION OF BREAST BIOPSY Left 09/11/2020   Procedure: EXCISION OF BREAST BIOPSY;  Surgeon: [name], MD;  Location: [facility] ORS;  Service: General;   Laterality: Left;   Foam sclerotherapy Bilateral 09/2015   Reed Pandy, [place]   MASTECTOMY W/ SENTINEL NODE BIOPSY Left 06/25/2021   Procedure: MASTECTOMY WITH SENTINEL LYMPH NODE BIOPSY;  Surgeon: [name], MD;  Location: [facility] ORS;  Service: General;  Laterality: Left;   NASAL RECONSTRUCTION  2005   for [place] L nare s/p Mohs   nuclear stress test  2014   normal in [place] BX Left 09/11/2020   Procedure: PARTIAL MASTECTOMY WITH NEEDLE LOCALIZATION AND AXILLARY SENTINEL LYMPH NODE [place];  Surgeon: [name], MD;  Location: [facility] ORS;  Service: General;  Laterality: Left;   RADIOFREQUENCY ABLATION Left 01/2012   remnant L G saphenous vein below knee   RADIOFREQUENCY ABLATION Right 02/2013   RLE vein ablation    VEIN LIGATION AND STRIPPING  remote   bilateral G saphenous veins    There were no vitals filed for this visit.    Subjective Assessment - 09/04/21 1503     Subjective L heel: 6/10 currently (pt sitting), 8/10 at most for the past 3 months. Neck ( R upper trap and medial  scapula) 3.5/10 currently 5/10 at most for the past 3 months.    Pertinent History Neck and achilles pain. L knee also bothers her. Had injection L knee which helped 10 days before her breast surgery June 25, 2021. Dr. [name] also states Pt needs surgery for her L knee (TKA). Pt however has a heel spur on L foot which is chronic. Went to 3 specialists for heel spur and was told that there is nothing can be done surgically. The heel pain however affects her gait which affects her L knee increasing L knee pain. Pt continues to do her HEP from previous PT. Neck pain began about 6 months ago gradually. Started feeling tightness from R medial shoulder blade to neck. Pt is R hand dominant. L foot bothers her more currently.    Patient Stated Goals Learn how to mitigate L heel pain.    Currently in Pain? Yes    Pain Score 6      Pain Descriptors / Indicators --   L heel: tender. Neck tightness, pulling   Pain Type Chronic pain    Pain Onset More than a month ago    Pain Frequency Occasional    Aggravating Factors  L heel/ Achilles: walking; turning her head to the R > L    Pain Relieving Factors Neck: heating pad and cervical rotations help.; L foot: night splint for foot.                [facility] PT Assessment - 09/04/21 1520       Assessment   Medical Diagnosis M76.60 (ICD-10-CM) - Insertional Achilles tendinopathy  M54.2 (ICD-10-CM) - Cervicalgia    Referring Provider (PT) Owens Loffler, MD    Onset Date/Surgical Date 07/21/21   Date PT referral signed. Chronic condition   Hand Dominance Right    Prior Therapy yes with positive results      Precautions   Precaution Comments hx of breast CA S/L L mastectomy      Balance Screen   Has the patient fallen in the past 6 months No    Has the patient had a decrease in activity level because of a fear of falling?  No    Is the patient reluctant to leave their home because of a fear of falling?  No      Observation/Other Assessments   Focus on Therapeutic Outcomes (FOTO)  L foot FOTO 38      AROM   Left Ankle Dorsiflexion 7      Strength   Right Hip Extension 4-/5   seated manually resisted hip extension   Right Hip ABduction 3-/5    Left Hip Extension 3+/5   seated manually resisted hip extension   Left Hip ABduction 4-/5    Right Knee Flexion 4/5    Right Knee Extension 4+/5    Left Knee Flexion 4/5    Left Ankle Dorsiflexion 5/5   supine manually resisted   Left Ankle Plantar Flexion 4+/5   supine manually resisted   Left Ankle Inversion 4/5   supine manually resisted   Left Ankle Eversion 4-/5   supine manually resisted     Palpation   Palpation comment TTP L heel      Ambulation/Gait   Gait Comments decreased stance L LE, increased L foot pronation during stance phase, bilateral genu valgus                          Objective  measurements completed on examination: See above findings.        B varicose veins legs Osteopenia   L heel pain Hx of L mastectomy on June 25, 2021 and had home health PT for recovery.    Started using a SPC 5 months ago when ambulating by herself. Furniture walk at home.     Manual therapy   Seated STM/cross friction technique L heel/Achilles area  Seated manually resisted L ankle PF isometrics 40% effort, PT resistance 1 min x 3    Response to treatment Decreased Heel pain with gait reported after session.    Clinical Impression Pt is an 86 year old female who came to physical therapy secondary to neck and L heel/Achilles pain. She also presents with altered gait pattern and posture, decreased bilateral hip strength and femoral control, TTP L Achilles area, and difficulty performing tasks which involve walking as well as turning her head secondary to pain. Pt will benefit from skilled physical therapy services to address the aforementioned deficits.            PT Education - 09/04/21 1818     Education Details Plan of care    Person(s) Educated Patient    Methods Explanation    Comprehension Verbalized understanding              PT Short Term Goals - 09/04/21 1822       PT SHORT TERM GOAL #1   Title Patient will be independent with her initial HEP to improve strength, decrease neck and L heel pain, improve function.    Time 3    Period Weeks    Status New    Target Date 09/25/21               PT Long Term Goals - 09/04/21 1823       PT LONG TERM GOAL #1   Title Pt will have a decrease in L heel pain to 4/10 or less at worst to promote ability to ambulate, perform standing tasks more comfortably.    Baseline 8/10 L heel/Achilles pain at worst for the past 3 months (09/04/2021)    Time 8    Period Weeks    Status New    Target Date 10/30/21      PT LONG TERM GOAL #2   Title Pt will improve  bilateral hip extension and abduction strength by at least 1/2 MMT grade to promote femoral control and ability to ambulate and perform closed chain tasks with less L heel/Achilles pain.    Baseline Hip extension 4-/5 R, 3+/5 L, hip abduction 3-/5 R, 4-/5 L (09/04/2021)    Time 8    Period Weeks    Status New    Target Date 10/30/21      PT LONG TERM GOAL #3   Title Pt will improve her L foot FOTO score by at least 10 points as a demonstration of improved function.    Baseline L foot FOTO 38 (09/04/2021)    Time 8    Period Weeks    Status New    Target Date 10/30/21      PT LONG TERM GOAL #4   Title Pt will have a decrease in neck pain to 2/10 or less at worst to promote ability to look around more comfortably.    Baseline 5/10 neck pain at worst for the past 3 months (09/04/2021)    Time 8    Period Weeks    Status New  Target Date 10/30/21                    Plan - 09/04/21 1818     Clinical Impression Statement Pt is an 86 year old female who came to physical therapy secondary to neck and L heel/Achilles pain. She also presents with altered gait pattern and posture, decreased bilateral hip strength and femoral control, TTP L Achilles area, and difficulty performing tasks which involve walking as well as turning her head secondary to pain. Pt will benefit from skilled physical therapy services to address the aforementioned deficits.    Personal Factors and Comorbidities Age;Comorbidity 3+;Time since onset of injury/illness/exacerbation;Fitness    Comorbidities Osteopenia, chronic venous insufficiency,Hx of basal cell CA S/P lumpectomy, HTN    Examination-Activity Limitations Stand;Locomotion Level;Squat;Stairs;Carry;Lift    Stability/Clinical Decision Making Evolving/Moderate complexity    Clinical Decision Making Moderate    Clinical Presentation due to: L heel pain seems to be worsening based on subjective reports    Rehab Potential Fair    Clinical Impairments Affecting  Rehab Potential Chronicity of condition, age, fitness level    PT Frequency 2x / week    PT Duration 8 weeks    PT Treatment/Interventions Aquatic Therapy;Electrical Stimulation;Iontophoresis '4mg'$ /ml Dexamethasone;Gait training;Stair training;Functional mobility training;Therapeutic activities;Therapeutic exercise;Balance training;Neuromuscular re-education;Patient/family education;Manual techniques;Dry needling   electrical stimulation related treatment and ultrasound if appropriate   PT Next Visit Plan isometric plantar flexion, glute, trunk, scapular strengthening, femoral control, manual techniques, modalities PRN    Consulted and Agree with Plan of Care Patient             Patient will benefit from skilled therapeutic intervention in order to improve the following deficits and impairments:  Postural dysfunction, Decreased strength, Improper body mechanics, Pain, Difficulty walking  Visit Diagnosis: Pain in left foot - Plan: PT plan of care cert/re-cert  Cervicalgia - Plan: PT plan of care cert/re-cert  Difficulty in walking, not elsewhere classified - Plan: PT plan of care cert/re-cert     Problem List Patient Active Problem List   Diagnosis Date Noted   Goals of care, counseling/discussion 06/25/2021   Breast cancer ([place]) 06/25/2021   Pre-op evaluation 06/02/2021   Right wrist pain 02/28/2021   Skin rash 02/28/2021   Ascending aorta dilatation ([place]) 01/10/2021   Left-sided chest pain 01/10/2021   Genetic testing 10/30/2020   Malignant neoplasm of overlapping sites of left breast in female, estrogen receptor positive ([place]) 10/22/2020   Family history of breast cancer    Family history of thyroid cancer    Insertional Achilles tendinopathy 08/28/2020   Breast cancer, left breast ([place]) 08/01/2020   Right carotid bruit 06/05/2020   Chest pressure 06/03/2020   Left Achilles tendinitis 09/01/2019   Low back pain 08/29/2019   Lumbosacral spondylosis without myelopathy  08/29/2019   Calcaneal spur 08/17/2019   Contusion of knee 10/04/2018   Varicose veins of bilateral lower extremities with pain 03/30/2018   Lymphedema 03/30/2018   Dizziness 02/02/2017   Situational depression 12/18/2016   Vitamin D deficiency 10/11/2016   Fatigue 08/12/2016   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 04/14/2016   Essential hypertension 12/17/2015   Dermatitis of external ear 12/17/2015   Corn of foot 05/16/2015   Advanced care planning/counseling discussion 10/30/2014   Neck pain on right side 06/25/2014   Primary osteoarthritis of left knee 08/17/2013   Oropharyngeal dysphagia 11/15/2012   Stressful life events affecting family and household 03/30/2012   Medicare annual  wellness visit, subsequent 03/30/2012   History of basal cell carcinoma    Osteoarthritis    Osteopenia    Hypothyroidism    Glaucoma    Chronic venous insufficiency     Joneen Boers PT, DPT   09/04/2021, 6:38 PM  [facility] [address], [place], [address] Phone: [phone]   Fax:  [phone]  Name: Beth Rodgers MRN: [id number] Date of Birth: 06-18-1935

## 2021-09-04 NOTE — Patient Instructions (Signed)
Dr. Ricki Rodriguez or Dr. Alvan Dame

## 2021-09-09 ENCOUNTER — Ambulatory Visit: Payer: Medicare Other

## 2021-09-09 DIAGNOSIS — M542 Cervicalgia: Secondary | ICD-10-CM | POA: Diagnosis not present

## 2021-09-09 DIAGNOSIS — R262 Difficulty in walking, not elsewhere classified: Secondary | ICD-10-CM | POA: Diagnosis not present

## 2021-09-09 DIAGNOSIS — M79672 Pain in left foot: Secondary | ICD-10-CM

## 2021-09-09 NOTE — Patient Instructions (Signed)
Access Code: [email or web address] URL: [email or web address] Date: 09/09/2021 Prepared by: Joneen Boers  Exercises Seated Hip External Rotation AROM - 1 x daily - 7 x weekly - 2 sets - 10 reps Standing Hip Abduction with Counter Support - 1 x daily - 7 x weekly - 2-3 sets - 10 reps

## 2021-09-09 NOTE — Therapy (Signed)
[facility] [address], [place], [address] Phone: [phone]   Fax:  [phone]  Physical Therapy Treatment  Patient Details  Name: Beth Rodgers MRN: [id number] Date of Birth: 1935/09/05 Referring Provider (PT): Owens Loffler, MD   Encounter Date: 09/09/2021   PT End of Session - 09/09/21 1016     Visit Number 2    Number of Visits 17    Date for PT Re-Evaluation 10/30/21    Authorization Type 2    Authorization Time Period 10    PT Start Time 1016    PT Stop Time 1101    PT Time Calculation (min) 45 min    Activity Tolerance Patient tolerated treatment well    Behavior During Therapy [facility] for tasks assessed/performed             Past Medical History:  Diagnosis Date   Basal cell carcinoma ([place])    txted with MOHs in past   Cellulitis 08/10/2018   Chronic venous insufficiency    with varicose veins   Family history of breast cancer    Family history of thyroid cancer    GERD (gastroesophageal reflux disease)    Glaucoma    History  of basal cell carcinoma    left nare/BREAST CANCER   History of chicken pox    Hypertension    Hypoglycemia    Hypothyroid    Osteoarthritis    back pain, L knee pain   Osteopenia 06/2009   DEXA 11/2015: T -2.3 hip, -2.2 spine, on longterm bisphosphonate/evista   Psoriasis    Pyogenic granuloma 04/16/2016    Past Surgical History:  Procedure Laterality Date   BASAL CELL CARCINOMA EXCISION     located on face   BREAST BIOPSY Left    core done ing [place]?   BREAST BIOPSY Left 08/16/2020   3 area bx 4:00 X, IMC, 6:00Q IMC, LN hydro #3-benign   BREAST LUMPECTOMY Left 09/11/2020   two areas of [facility]   CATARACT EXTRACTION     bilateral   DEXA  06/2009   T score Spine: -1.8, hip -2.0   EXCISION OF BREAST BIOPSY Left 09/11/2020   Procedure: EXCISION OF BREAST BIOPSY;  Surgeon: [name], MD;  Location: [facility] ORS;  Service: General;   Laterality: Left;   Foam sclerotherapy Bilateral 09/2015   Reed [place], [place]   MASTECTOMY W/ SENTINEL NODE BIOPSY Left 06/25/2021   Procedure: MASTECTOMY WITH SENTINEL LYMPH NODE BIOPSY;  Surgeon: [name], MD;  Location: [facility] ORS;  Service: General;  Laterality: Left;   NASAL RECONSTRUCTION  2005   for [place] L nare s/p Mohs   nuclear stress test  2014   normal in [place] BX Left 09/11/2020   Procedure: PARTIAL MASTECTOMY WITH NEEDLE LOCALIZATION AND AXILLARY SENTINEL LYMPH NODE [place];  Surgeon: [name], MD;  Location: [facility] ORS;  Service: General;  Laterality: Left;   RADIOFREQUENCY ABLATION Left 01/2012   remnant L G saphenous vein below knee   RADIOFREQUENCY ABLATION Right 02/2013   RLE vein ablation    VEIN LIGATION AND STRIPPING  remote   bilateral G saphenous veins    There were no vitals filed for this visit.   Subjective Assessment - 09/09/21 1018     Subjective L heel is not good. 6/10 currently while walking. Was 7/10 earlier this morning. Was good for a little while for  a few hours after last session.    Pertinent History Neck and achilles pain. L knee also bothers her. Had injection L knee which helped 10 days before her breast surgery June 25, 2021. Dr. [name] also states Pt needs surgery for her L knee (TKA). Pt however has a heel spur on L foot which is chronic. Went to 3 specialists for heel spur and was told that there is nothing can be done surgically. The heel pain however affects her gait which affects her L knee increasing L knee pain. Pt continues to do her HEP from previous PT. Neck pain began about 6 months ago gradually. Started feeling tightness from R medial shoulder blade to neck. Pt is R hand dominant. L foot bothers her more currently.    Patient Stated Goals Learn how to mitigate L heel pain.    Currently in Pain? Yes    Pain Score 6     Pain Onset More  than a month ago                                        PT Education - 09/09/21 1301     Education Details ther-ex, HEP    Person(s) Educated Patient    Methods Explanation;Demonstration;Tactile cues;Verbal cues;Handout    Comprehension Returned demonstration;Verbalized understanding              Objective         B varicose veins legs Osteopenia     L heel pain Hx of L mastectomy on June 25, 2021 and had home health PT for recovery.      Started using a SPC 5 months ago when ambulating by herself. Furniture walk at home.    Medbridge Access Code [email or web address]     Manual therapy  Seated STM/cross friction technique L heel/Achilles area      Therapeutic exercise  Seated manually resisted L ankle PF isometrics 40% effort, PT resistance 1 min x 5  Gait a few feet with L arch supported. Slight improved comfort  Seated L hip ER 10x2  Standing hip abduction with B UE assist  L 5x5 seconds, then 10x2  Pt states wanting to continue wearing the arch (makeshift arch support) support (her decision) after PT because it helps.    Improved exercise technique, movement at target joints, use of target muscles after mod verbal, visual, tactile cues.      Response to treatment Fair tolerance to today's session.      Clinical Impression Slight decrease in L heel pain with L arch supported secondary to increased pes planus. Also worked on L hip ER and abduction strength secondary to decreased L LE femoral control (increased L femoral adduction and IR/L knee valgus) during L LE stance phase of gait. Fair tolerance to today's session. Pt will benefit from continued skilled physical therapy services to decrease pain, improve strength and function.         PT Short Term Goals - 09/04/21 1822       PT SHORT TERM GOAL #1   Title Patient will be independent with her initial HEP to improve strength, decrease neck and L heel pain, improve function.     Time 3    Period Weeks    Status New    Target Date 09/25/21               PT Long Term  Goals - 09/04/21 1823       PT LONG TERM GOAL #1   Title Pt will have a decrease in L heel pain to 4/10 or less at worst to promote ability to ambulate, perform standing tasks more comfortably.    Baseline 8/10 L heel/Achilles pain at worst for the past 3 months (09/04/2021)    Time 8    Period Weeks    Status New    Target Date 10/30/21      PT LONG TERM GOAL #2   Title Pt will improve bilateral hip extension and abduction strength by at least 1/2 MMT grade to promote femoral control and ability to ambulate and perform closed chain tasks with less L heel/Achilles pain.    Baseline Hip extension 4-/5 R, 3+/5 L, hip abduction 3-/5 R, 4-/5 L (09/04/2021)    Time 8    Period Weeks    Status New    Target Date 10/30/21      PT LONG TERM GOAL #3   Title Pt will improve her L foot FOTO score by at least 10 points as a demonstration of improved function.    Baseline L foot FOTO 38 (09/04/2021)    Time 8    Period Weeks    Status New    Target Date 10/30/21      PT LONG TERM GOAL #4   Title Pt will have a decrease in neck pain to 2/10 or less at worst to promote ability to look around more comfortably.    Baseline 5/10 neck pain at worst for the past 3 months (09/04/2021)    Time 8    Period Weeks    Status New    Target Date 10/30/21                   Plan - 09/09/21 1016     Clinical Impression Statement Slight decrease in L heel pain with L arch supported secondary to increased pes planus. Also worked on L hip ER and abduction strength secondary to decreased L LE femoral control (increased L femoral adduction and IR/L knee valgus) during L LE stance phase of gait. Fair tolerance to today's session. Pt will benefit from continued skilled physical therapy services to decrease pain, improve strength and function.    Personal Factors and Comorbidities Age;Comorbidity 3+;Time since  onset of injury/illness/exacerbation;Fitness    Comorbidities Osteopenia, chronic venous insufficiency,Hx of basal cell CA S/P lumpectomy, HTN    Examination-Activity Limitations Stand;Locomotion Level;Squat;Stairs;Carry;Lift    Stability/Clinical Decision Making Evolving/Moderate complexity    Rehab Potential Fair    Clinical Impairments Affecting Rehab Potential Chronicity of condition, age, fitness level    PT Frequency 2x / week    PT Duration 8 weeks    PT Treatment/Interventions Aquatic Therapy;Electrical Stimulation;Iontophoresis '4mg'$ /ml Dexamethasone;Gait training;Stair training;Functional mobility training;Therapeutic activities;Therapeutic exercise;Balance training;Neuromuscular re-education;Patient/family education;Manual techniques;Dry needling   electrical stimulation related treatment and ultrasound if appropriate   PT Next Visit Plan isometric plantar flexion, glute, trunk, scapular strengthening, femoral control, manual techniques, modalities PRN    Consulted and Agree with Plan of Care Patient             Patient will benefit from skilled therapeutic intervention in order to improve the following deficits and impairments:  Postural dysfunction, Decreased strength, Improper body mechanics, Pain, Difficulty walking  Visit Diagnosis: Pain in left foot  Cervicalgia  Difficulty in walking, not elsewhere classified     Problem List Patient Active Problem List  Diagnosis Date Noted   Goals of care, counseling/discussion 06/25/2021   Breast cancer ([place]) 06/25/2021   Pre-op evaluation 06/02/2021   Right wrist pain 02/28/2021   Skin rash 02/28/2021   Ascending aorta dilatation ([place]) 01/10/2021   Left-sided chest pain 01/10/2021   Genetic testing 10/30/2020   Malignant neoplasm of overlapping sites of left breast in female, estrogen receptor positive ([place]) 10/22/2020   Family history of breast cancer    Family history of thyroid cancer    Insertional Achilles  tendinopathy 08/28/2020   Breast cancer, left breast ([place]) 08/01/2020   Right carotid bruit 06/05/2020   Chest pressure 06/03/2020   Left Achilles tendinitis 09/01/2019   Low back pain 08/29/2019   Lumbosacral spondylosis without myelopathy 08/29/2019   Calcaneal spur 08/17/2019   Contusion of knee 10/04/2018   Varicose veins of bilateral lower extremities with pain 03/30/2018   Lymphedema 03/30/2018   Dizziness 02/02/2017   Situational depression 12/18/2016   Vitamin D deficiency 10/11/2016   Fatigue 08/12/2016   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 04/14/2016   Essential hypertension 12/17/2015   Dermatitis of external ear 12/17/2015   Corn of foot 05/16/2015   Advanced care planning/counseling discussion 10/30/2014   Neck pain on right side 06/25/2014   Primary osteoarthritis of left knee 08/17/2013   Oropharyngeal dysphagia 11/15/2012   Stressful life events affecting family and household 03/30/2012   Medicare annual wellness visit, subsequent 03/30/2012   History of basal cell carcinoma    Osteoarthritis    Osteopenia    Hypothyroidism    Glaucoma    Chronic venous insufficiency     Joneen Boers PT, DPT   09/09/2021, 1:11 PM  [facility] [address], [place], [address] Phone: [phone]   Fax:  [phone]  Name: RHIANNON BRIEN MRN: [id number] Date of Birth: 23-Jan-1935

## 2021-09-10 DIAGNOSIS — H01115 Allergic dermatitis of left lower eyelid: Secondary | ICD-10-CM | POA: Diagnosis not present

## 2021-09-10 DIAGNOSIS — H01112 Allergic dermatitis of right lower eyelid: Secondary | ICD-10-CM | POA: Diagnosis not present

## 2021-09-11 ENCOUNTER — Ambulatory Visit: Payer: Medicare Other

## 2021-09-11 DIAGNOSIS — M542 Cervicalgia: Secondary | ICD-10-CM

## 2021-09-11 DIAGNOSIS — M79672 Pain in left foot: Secondary | ICD-10-CM

## 2021-09-11 DIAGNOSIS — R262 Difficulty in walking, not elsewhere classified: Secondary | ICD-10-CM | POA: Diagnosis not present

## 2021-09-11 NOTE — Patient Instructions (Signed)
Access Code: [email or web address] URL: [email or web address] Date: 09/11/2021 Prepared by: Joneen Boers  Exercises Seated Hip External Rotation AROM - 1 x daily - 7 x weekly - 2 sets - 10 reps Standing Hip Abduction with Counter Support - 1 x daily - 7 x weekly - 2-3 sets - 10 reps Hooklying Clamshell with Resistance - 1 x daily - 7 x weekly - 1 sets - 10 reps

## 2021-09-11 NOTE — Therapy (Signed)
[facility] [address], [place], [address] Phone: [phone]   Fax:  [phone]  Physical Therapy Treatment  Patient Details  Name: Beth Rodgers MRN: [id number] Date of Birth: 08/04/1935 Referring Provider (PT): Owens Loffler, MD   Encounter Date: 09/11/2021   PT End of Session - 09/11/21 1149     Visit Number 3    Number of Visits 17    Date for PT Re-Evaluation 10/30/21    Authorization Type 3    Authorization Time Period 10    PT Start Time 1150    PT Stop Time 1231    PT Time Calculation (min) 41 min    Activity Tolerance Patient tolerated treatment well    Behavior During Therapy [facility] for tasks assessed/performed             Past Medical History:  Diagnosis Date   Basal cell carcinoma ([place])    txted with MOHs in past   Cellulitis 08/10/2018   Chronic venous insufficiency    with varicose veins   Family history of breast cancer    Family history of thyroid cancer    GERD (gastroesophageal reflux disease)    Glaucoma    History  of basal cell carcinoma    left nare/BREAST CANCER   History of chicken pox    Hypertension    Hypoglycemia    Hypothyroid    Osteoarthritis    back pain, L knee pain   Osteopenia 06/2009   DEXA 11/2015: T -2.3 hip, -2.2 spine, on longterm bisphosphonate/evista   Psoriasis    Pyogenic granuloma 04/16/2016    Past Surgical History:  Procedure Laterality Date   BASAL CELL CARCINOMA EXCISION     located on face   BREAST BIOPSY Left    core done ing [place]?   BREAST BIOPSY Left 08/16/2020   3 area bx 4:00 X, IMC, 6:00Q IMC, LN hydro #3-benign   BREAST LUMPECTOMY Left 09/11/2020   two areas of [facility]   CATARACT EXTRACTION     bilateral   DEXA  06/2009   T score Spine: -1.8, hip -2.0   EXCISION OF BREAST BIOPSY Left 09/11/2020   Procedure: EXCISION OF BREAST BIOPSY;  Surgeon: [name], MD;  Location: [facility] ORS;  Service: General;   Laterality: Left;   Foam sclerotherapy Bilateral 09/2015   [place], [place]   MASTECTOMY W/ SENTINEL NODE BIOPSY Left 06/25/2021   Procedure: MASTECTOMY WITH SENTINEL LYMPH NODE BIOPSY;  Surgeon: [name], MD;  Location: [facility] ORS;  Service: General;  Laterality: Left;   NASAL RECONSTRUCTION  2005   for [place] L nare s/p Mohs   nuclear stress test  2014   normal in [place] BX Left 09/11/2020   Procedure: PARTIAL MASTECTOMY WITH NEEDLE LOCALIZATION AND AXILLARY SENTINEL LYMPH NODE [place];  Surgeon: [name], MD;  Location: [facility] ORS;  Service: General;  Laterality: Left;   RADIOFREQUENCY ABLATION Left 01/2012   remnant L G saphenous vein below knee   RADIOFREQUENCY ABLATION Right 02/2013   RLE vein ablation    VEIN LIGATION AND STRIPPING  remote   bilateral G saphenous veins    There were no vitals filed for this visit.   Subjective Assessment - 09/11/21 1152     Subjective L heel feels less painful than yesterday. Bought herself a heating pad and applied it to her L heel.  Pertinent History Neck and achilles pain. L knee also bothers her. Had injection L knee which helped 10 days before her breast surgery June 25, 2021. Dr. [name] also states Pt needs surgery for her L knee (TKA). Pt however has a heel spur on L foot which is chronic. Went to 3 specialists for heel spur and was told that there is nothing can be done surgically. The heel pain however affects her gait which affects her L knee increasing L knee pain. Pt continues to do her HEP from previous PT. Neck pain began about 6 months ago gradually. Started feeling tightness from R medial shoulder blade to neck. Pt is R hand dominant. L foot bothers her more currently.    Patient Stated Goals Learn how to mitigate L heel pain.    Currently in Pain? Yes    Pain Score 5     Pain Onset More than a month ago                                         PT Education - 09/11/21 1228     Education Details ther-ex    Person(s) Educated Patient    Methods Explanation;Demonstration;Tactile cues;Verbal cues;Handout    Comprehension Returned demonstration;Verbalized understanding            Objective         B varicose veins legs Osteopenia     L heel pain Hx of L mastectomy on June 25, 2021 and had home health PT for recovery.      Started using a SPC 5 months ago when ambulating by herself. Furniture walk at home.    Medbridge Access Code [email or web address]     Manual therapy   Seated STM/cross friction technique L heel/Achilles area to promote blood flow as well as decrease fascial restrictions.      Therapeutic exercise  Gait with glute max squeeze  Seated manually resisted L ankle PF isometrics 40% effort, PT resistance 1 min x 5   Supine clamshell red band 10x2  L LE cramps during 2nd set, eases with rest  Supine manually resisted L hip abduction isometrics 10x5 seconds in neutral for 3 sets  Padding needed secondary to sensitivity of veins/discomfort  Supine L ankle DF/PF 10x  Hooklying hip extension, leg straight   L 10x5 seconds for 2 sets       Improved exercise technique, movement at target joints, use of target muscles after mod verbal, visual, tactile cues.        Response to treatment Decreased L heel pain to 4/10 during gait after session.      Clinical Impression Worked on improving blood floow, decreasing fascial restrictions L heel and promoting proper stress to achilles tendon to promote healing. Also worked on [place] strengthening to help decrease L knee valgus and increased stress to L foot during closed chain activities. Decreased L heel pain reported during gait after session. Pt will benefit from continued skilled physical therapy services to decrease pain, improve strength and function.       PT Short Term Goals - 09/04/21 1822        PT SHORT TERM GOAL #1   Title Patient will be independent with her initial HEP to improve strength, decrease neck and L heel pain, improve function.    Time 3    Period Weeks    Status New  Target Date 09/25/21               PT Long Term Goals - 09/04/21 1823       PT LONG TERM GOAL #1   Title Pt will have a decrease in L heel pain to 4/10 or less at worst to promote ability to ambulate, perform standing tasks more comfortably.    Baseline 8/10 L heel/Achilles pain at worst for the past 3 months (09/04/2021)    Time 8    Period Weeks    Status New    Target Date 10/30/21      PT LONG TERM GOAL #2   Title Pt will improve bilateral hip extension and abduction strength by at least 1/2 MMT grade to promote femoral control and ability to ambulate and perform closed chain tasks with less L heel/Achilles pain.    Baseline Hip extension 4-/5 R, 3+/5 L, hip abduction 3-/5 R, 4-/5 L (09/04/2021)    Time 8    Period Weeks    Status New    Target Date 10/30/21      PT LONG TERM GOAL #3   Title Pt will improve her L foot FOTO score by at least 10 points as a demonstration of improved function.    Baseline L foot FOTO 38 (09/04/2021)    Time 8    Period Weeks    Status New    Target Date 10/30/21      PT LONG TERM GOAL #4   Title Pt will have a decrease in neck pain to 2/10 or less at worst to promote ability to look around more comfortably.    Baseline 5/10 neck pain at worst for the past 3 months (09/04/2021)    Time 8    Period Weeks    Status New    Target Date 10/30/21                   Plan - 09/11/21 1237     Clinical Impression Statement Worked on improving blood floow, decreasing fascial restrictions L heel and promoting proper stress to achilles tendon to promote healing. Also worked on [place] strengthening to help decrease L knee valgus and increased stress to L foot during closed chain activities. Decreased L heel pain reported during gait after session. Pt  will benefit from continued skilled physical therapy services to decrease pain, improve strength and function.    Personal Factors and Comorbidities Age;Comorbidity 3+;Time since onset of injury/illness/exacerbation;Fitness    Comorbidities Osteopenia, chronic venous insufficiency,Hx of basal cell CA S/P lumpectomy, HTN    Examination-Activity Limitations Stand;Locomotion Level;Squat;Stairs;Carry;Lift    Stability/Clinical Decision Making Evolving/Moderate complexity    Rehab Potential Fair    Clinical Impairments Affecting Rehab Potential Chronicity of condition, age, fitness level    PT Frequency 2x / week    PT Duration 8 weeks    PT Treatment/Interventions Aquatic Therapy;Electrical Stimulation;Iontophoresis '4mg'$ /ml Dexamethasone;Gait training;Stair training;Functional mobility training;Therapeutic activities;Therapeutic exercise;Balance training;Neuromuscular re-education;Patient/family education;Manual techniques;Dry needling   electrical stimulation related treatment and ultrasound if appropriate   PT Next Visit Plan isometric plantar flexion, glute, trunk, scapular strengthening, femoral control, manual techniques, modalities PRN    Consulted and Agree with Plan of Care Patient             Patient will benefit from skilled therapeutic intervention in order to improve the following deficits and impairments:  Postural dysfunction, Decreased strength, Improper body mechanics, Pain, Difficulty walking  Visit Diagnosis: Pain in left foot  Cervicalgia  Difficulty in walking, not elsewhere classified     Problem List Patient Active Problem List   Diagnosis Date Noted   Goals of care, counseling/discussion 06/25/2021   Breast cancer ([place]) 06/25/2021   Pre-op evaluation 06/02/2021   Right wrist pain 02/28/2021   Skin rash 02/28/2021   Ascending aorta dilatation ([place]) 01/10/2021   Left-sided chest pain 01/10/2021   Genetic testing 10/30/2020   Malignant neoplasm of overlapping  sites of left breast in female, estrogen receptor positive ([place]) 10/22/2020   Family history of breast cancer    Family history of thyroid cancer    Insertional Achilles tendinopathy 08/28/2020   Breast cancer, left breast ([place]) 08/01/2020   Right carotid bruit 06/05/2020   Chest pressure 06/03/2020   Left Achilles tendinitis 09/01/2019   Low back pain 08/29/2019   Lumbosacral spondylosis without myelopathy 08/29/2019   Calcaneal spur 08/17/2019   Contusion of knee 10/04/2018   Varicose veins of bilateral lower extremities with pain 03/30/2018   Lymphedema 03/30/2018   Dizziness 02/02/2017   Situational depression 12/18/2016   Vitamin D deficiency 10/11/2016   Fatigue 08/12/2016   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 04/14/2016   Essential hypertension 12/17/2015   Dermatitis of external ear 12/17/2015   Corn of foot 05/16/2015   Advanced care planning/counseling discussion 10/30/2014   Neck pain on right side 06/25/2014   Primary osteoarthritis of left knee 08/17/2013   Oropharyngeal dysphagia 11/15/2012   Stressful life events affecting family and household 03/30/2012   Medicare annual wellness visit, subsequent 03/30/2012   History of basal cell carcinoma    Osteoarthritis    Osteopenia    Hypothyroidism    Glaucoma    Chronic venous insufficiency     Joneen Boers PT, DPT   09/11/2021, 12:39 PM  [facility] [address] [place], [address] Phone: [phone]   Fax:  [phone]  Name: Beth Rodgers MRN: [id number] Date of Birth: 19-Jan-1935

## 2021-09-15 ENCOUNTER — Ambulatory Visit (INDEPENDENT_AMBULATORY_CARE_PROVIDER_SITE_OTHER): Payer: Medicare Other | Admitting: Dermatology

## 2021-09-15 ENCOUNTER — Ambulatory Visit: Payer: Medicare Other

## 2021-09-15 ENCOUNTER — Other Ambulatory Visit: Payer: Self-pay

## 2021-09-15 DIAGNOSIS — R262 Difficulty in walking, not elsewhere classified: Secondary | ICD-10-CM

## 2021-09-15 DIAGNOSIS — M79672 Pain in left foot: Secondary | ICD-10-CM

## 2021-09-15 DIAGNOSIS — L309 Dermatitis, unspecified: Secondary | ICD-10-CM | POA: Diagnosis not present

## 2021-09-15 DIAGNOSIS — M542 Cervicalgia: Secondary | ICD-10-CM | POA: Diagnosis not present

## 2021-09-15 NOTE — Patient Instructions (Signed)
Access Code: [email or web address] URL: [email or web address] Date: 09/15/2021 Prepared by: Joneen Boers  Exercises Seated Hip External Rotation AROM - 1 x daily - 7 x weekly - 2 sets - 10 reps Standing Hip Abduction with Counter Support - 1 x daily - 7 x weekly - 2-3 sets - 10 reps  Discontinued supine resisted clamshells   Hooklying hip extension, leg straight              L 10x5 seconds for 3 sets  Reviewed and given as part pf her HEP. Pt demonstrated and verbalized understanding. Handout provided.

## 2021-09-15 NOTE — Therapy (Signed)
[facility] [address], [place], [address] Phone: [phone]   Fax:  [phone]  Physical Therapy Treatment  Patient Details  Name: Beth Rodgers MRN: [id number] Date of Birth: [birth year] Referring Provider (PT): Owens Loffler, MD   Encounter Date: 09/15/2021   PT End of Session - 09/15/21 1149     Visit Number 4    Number of Visits 17    Date for PT Re-Evaluation 10/30/21    Authorization Type 4    Authorization Time Period 10    PT Start Time 1149    PT Stop Time 1227    PT Time Calculation (min) 38 min    Activity Tolerance Patient tolerated treatment well    Behavior During Therapy [facility] for tasks assessed/performed             Past Medical History:  Diagnosis Date   Basal cell carcinoma ([place])    txted with MOHs in past   Cellulitis 08/10/2018   Chronic venous insufficiency    with varicose veins   Family history of breast cancer    Family history of thyroid cancer    GERD (gastroesophageal reflux disease)    Glaucoma    History  of basal cell carcinoma    left nare/BREAST CANCER   History of chicken pox    Hypertension    Hypoglycemia    Hypothyroid    Osteoarthritis    back pain, L knee pain   Osteopenia 06/2009   DEXA 11/2015: T -2.3 hip, -2.2 spine, on longterm bisphosphonate/evista   Psoriasis    Pyogenic granuloma 04/16/2016    Past Surgical History:  Procedure Laterality Date   BASAL CELL CARCINOMA EXCISION     located on face   BREAST BIOPSY Left    core done ing [place]?   BREAST BIOPSY Left 08/16/2020   3 area bx 4:00 X, IMC, 6:00Q IMC, LN hydro #3-benign   BREAST LUMPECTOMY Left 09/11/2020   two areas of [facility]   CATARACT EXTRACTION     bilateral   DEXA  06/2009   T score Spine: -1.8, hip -2.0   EXCISION OF BREAST BIOPSY Left 09/11/2020   Procedure: EXCISION OF BREAST BIOPSY;  Surgeon: [name], MD;  Location: [facility] ORS;  Service: General;   Laterality: Left;   Foam sclerotherapy Bilateral 09/2015   [place], [place]   MASTECTOMY W/ SENTINEL NODE BIOPSY Left 06/25/2021   Procedure: MASTECTOMY WITH SENTINEL LYMPH NODE BIOPSY;  Surgeon: [name], MD;  Location: [facility] ORS;  Service: General;  Laterality: Left;   NASAL RECONSTRUCTION  2005   for [place] L nare s/p Mohs   nuclear stress test  2014   normal in [place] BX Left 09/11/2020   Procedure: PARTIAL MASTECTOMY WITH NEEDLE LOCALIZATION AND AXILLARY SENTINEL LYMPH NODE [place];  Surgeon: [name], MD;  Location: [facility] ORS;  Service: General;  Laterality: Left;   RADIOFREQUENCY ABLATION Left 01/2012   remnant L G saphenous vein below knee   RADIOFREQUENCY ABLATION Right 02/2013   RLE vein ablation    VEIN LIGATION AND STRIPPING  remote   bilateral G saphenous veins    There were no vitals filed for this visit.   Subjective Assessment - 09/15/21 1151     Subjective L heel is a tiny little bit better. 5/10 currently when walking.    Pertinent History Neck and achilles pain.  L knee also bothers her. Had injection L knee which helped 10 days before her breast surgery June 25, 2021. Dr. [name] also states Pt needs surgery for her L knee (TKA). Pt however has a heel spur on L foot which is chronic. Went to 3 specialists for heel spur and was told that there is nothing can be done surgically. The heel pain however affects her gait which affects her L knee increasing L knee pain. Pt continues to do her HEP from previous PT. Neck pain began about 6 months ago gradually. Started feeling tightness from R medial shoulder blade to neck. Pt is R hand dominant. L foot bothers her more currently.    Patient Stated Goals Learn how to mitigate L heel pain.    Currently in Pain? Yes    Pain Score 5     Pain Onset More than a month ago                                         PT Education - 09/15/21 1237     Education Details ther-ex, HEP    Person(s) Educated Patient    Methods Explanation;Demonstration;Tactile cues;Verbal cues;Handout    Comprehension Returned demonstration;Verbalized understanding             Objective      B varicose veins legs Osteopenia     L heel pain Hx of L mastectomy on June 25, 2021 and had home health PT for recovery.      Started using a SPC 5 months ago when ambulating by herself. Furniture walk at home.    Medbridge Access Code [email or web address]     Manual therapy   Seated STM/cross friction technique L heel/Achilles area to promote blood flow as well as decrease fascial restrictions.      Therapeutic exercise   Seated manually resisted L ankle PF isometrics 40% effort, PT resistance 1 min x 5  Supine manually resisted L hip abduction isometrics 10x5 seconds in neutral for 3 sets             Padding needed secondary to sensitivity of veins/discomfort    Hooklying hip extension, leg straight              L 10x5 seconds for 3 sets  Reviewed and given as part pf her HEP. Pt demonstrated and verbalized understanding. Handout provided.      Supine L ankle DF/PF 10x    Hooklying hip abduction isometrics with gait belt 5 seconds. R hamstrings cramp. Eases with rest. Exercise discontinued.    Hooklying posterior pelvic tilt 10x5 seconds  Slight cramps B hamstrings, eases with rest.        Improved exercise technique, movement at target joints, use of target muscles after mod verbal, visual, tactile cues.        Response to treatment Decreased L heel pain to 4/10 with gait after session.      Clinical Impression Decreased starting pain level from 6/10 to 5/10 based on today's and previous subjective reports. Continued working on improving blood flow, decreasing fascial restrictions L heel and promoting proper stress to achilles tendon to promote  healing. Also worked on [place] strengthening to help decrease L knee valgus and increased stress to L foot during closed chain activities. Pt will benefit from continued skilled physical therapy services to decrease pain, improve strength and function.  PT Short Term Goals - 09/04/21 1822       PT SHORT TERM GOAL #1   Title Patient will be independent with her initial HEP to improve strength, decrease neck and L heel pain, improve function.    Time 3    Period Weeks    Status New    Target Date 09/25/21               PT Long Term Goals - 09/04/21 1823       PT LONG TERM GOAL #1   Title Pt will have a decrease in L heel pain to 4/10 or less at worst to promote ability to ambulate, perform standing tasks more comfortably.    Baseline 8/10 L heel/Achilles pain at worst for the past 3 months (09/04/2021)    Time 8    Period Weeks    Status New    Target Date 10/30/21      PT LONG TERM GOAL #2   Title Pt will improve bilateral hip extension and abduction strength by at least 1/2 MMT grade to promote femoral control and ability to ambulate and perform closed chain tasks with less L heel/Achilles pain.    Baseline Hip extension 4-/5 R, 3+/5 L, hip abduction 3-/5 R, 4-/5 L (09/04/2021)    Time 8    Period Weeks    Status New    Target Date 10/30/21      PT LONG TERM GOAL #3   Title Pt will improve her L foot FOTO score by at least 10 points as a demonstration of improved function.    Baseline L foot FOTO 38 (09/04/2021)    Time 8    Period Weeks    Status New    Target Date 10/30/21      PT LONG TERM GOAL #4   Title Pt will have a decrease in neck pain to 2/10 or less at worst to promote ability to look around more comfortably.    Baseline 5/10 neck pain at worst for the past 3 months (09/04/2021)    Time 8    Period Weeks    Status New    Target Date 10/30/21                   Plan - 09/15/21 1239     Clinical Impression Statement Decreased starting pain  level from 6/10 to 5/10 based on today's and previous subjective reports. Continued working on improving blood floow, decreasing fascial restrictions L heel and promoting proper stress to achilles tendon to promote healing. Also worked on [place] strengthening to help decrease L knee valgus and increased stress to L foot during closed chain activities. Pt will benefit from continued skilled physical therapy services to decrease pain, improve strength and function.    Personal Factors and Comorbidities Age;Comorbidity 3+;Time since onset of injury/illness/exacerbation;Fitness    Comorbidities Osteopenia, chronic venous insufficiency,Hx of basal cell CA S/P lumpectomy, HTN    Examination-Activity Limitations Stand;Locomotion Level;Squat;Stairs;Carry;Lift    Stability/Clinical Decision Making Stable/Uncomplicated    Clinical Decision Making Low    Rehab Potential Fair    Clinical Impairments Affecting Rehab Potential Chronicity of condition, age, fitness level    PT Frequency 2x / week    PT Duration 8 weeks    PT Treatment/Interventions Aquatic Therapy;Electrical Stimulation;Iontophoresis '4mg'$ /ml Dexamethasone;Gait training;Stair training;Functional mobility training;Therapeutic activities;Therapeutic exercise;Balance training;Neuromuscular re-education;Patient/family education;Manual techniques;Dry needling   electrical stimulation related treatment and ultrasound if appropriate   PT Next Visit Plan isometric  plantar flexion, glute, trunk, scapular strengthening, femoral control, manual techniques, modalities PRN    Consulted and Agree with Plan of Care Patient             Patient will benefit from skilled therapeutic intervention in order to improve the following deficits and impairments:  Postural dysfunction, Decreased strength, Improper body mechanics, Pain, Difficulty walking  Visit Diagnosis: Pain in left foot  Difficulty in walking, not elsewhere classified     Problem List Patient  Active Problem List   Diagnosis Date Noted   Goals of care, counseling/discussion 06/25/2021   Breast cancer ([place]) 06/25/2021   Pre-op evaluation 06/02/2021   Right wrist pain 02/28/2021   Skin rash 02/28/2021   Ascending aorta dilatation ([place]) 01/10/2021   Left-sided chest pain 01/10/2021   Genetic testing 10/30/2020   Malignant neoplasm of overlapping sites of left breast in female, estrogen receptor positive ([place]) 10/22/2020   Family history of breast cancer    Family history of thyroid cancer    Insertional Achilles tendinopathy 08/28/2020   Breast cancer, left breast ([place]) 08/01/2020   Right carotid bruit 06/05/2020   Chest pressure 06/03/2020   Left Achilles tendinitis 09/01/2019   Low back pain 08/29/2019   Lumbosacral spondylosis without myelopathy 08/29/2019   Calcaneal spur 08/17/2019   Contusion of knee 10/04/2018   Varicose veins of bilateral lower extremities with pain 03/30/2018   Lymphedema 03/30/2018   Dizziness 02/02/2017   Situational depression 12/18/2016   Vitamin D deficiency 10/11/2016   Fatigue 08/12/2016   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 04/14/2016   Essential hypertension 12/17/2015   Dermatitis of external ear 12/17/2015   [place] of foot 05/16/2015   Advanced care planning/counseling discussion 10/30/2014   Neck pain on right side 06/25/2014   Primary osteoarthritis of left knee 08/17/2013   Oropharyngeal dysphagia 11/15/2012   Stressful life events affecting family and household 03/30/2012   Medicare annual wellness visit, subsequent 03/30/2012   History of basal cell carcinoma    Osteoarthritis    Osteopenia    Hypothyroidism    Glaucoma    Chronic venous insufficiency     Joneen Boers PT, DPT   09/15/2021, 12:49 PM  [facility] [address] [place], [address] Phone: [phone]   Fax:  [phone]  Name: Beth Rodgers MRN:  [id number] Date of Birth: 1935-12-25

## 2021-09-15 NOTE — Progress Notes (Signed)
   Follow-Up Visit   Subjective  Beth Rodgers is a 86 y.o. female who presents for the following: Rash (Itchy, red eyelids that started last week. She is using Refresh eye drops. She was prescribed Desonide from her eye dr, but she didn't use it. She tried tacrolimus ointment once, and thinks it may have helped a little. She has also been using warm compresses.). No history of seasonal allergies, no new facial products, make-ups or shampoos, no nail polish.     The following portions of the chart were reviewed this encounter and updated as appropriate:       Review of Systems:  No other skin or systemic complaints except as noted in HPI or Assessment and Plan.  Objective  Well appearing patient in no apparent distress; mood and affect are within normal limits.  A focused examination was performed including face. Relevant physical exam findings are noted in the Assessment and Plan.  periocular Erythema mild xerosis bilateral periocular area/eyelids   Assessment & Plan  Periocular dermatitis periocular   Start Tacrolimus ointment Apply to Aas rash periocular BID until improved.  If burns, may use Desonide for a couple days until rash not as flared. Ok to continue Refresh eye drops for dryness. VaniCream Ointment - apply around eyes during the day, sample given. VaniCream Gentle Facial Cleanser to wash with, sample given.  May apply cool water compresses prior to applying prescription medication.  Return if symptoms worsen or fail to improve.  IJamesetta Orleans, CMA, am acting as scribe for [name], MD . Documentation: I have reviewed the above documentation for accuracy and completeness, and I agree with the above.  [name] MD

## 2021-09-15 NOTE — Patient Instructions (Addendum)
Tacrolimus ointment - apply to rash around eyes twice a day until improved. If tacrolimus causes burning, may use Desonide for a couple of days first. May continue Refresh Eye Drops and cool compresses. VaniCream Ointment - apply around eyes during the day for dryness. Ok to use Desonide to ears for psoriasis.   If you have any questions or concerns for your doctor, please call our main line at [phone] and press option 4 to reach your doctor's medical assistant. If no one answers, please leave a voicemail as directed and we will return your call as soon as possible. Messages left after 4 pm will be answered the following business day.   You may also send [place] a message via [place]. We typically respond to MyChart messages within 1-2 business days.  For prescription refills, please ask your pharmacy to contact our office. Our fax number is [phone].  If you have an urgent issue when the clinic is closed that cannot wait until the next business day, you can page your doctor at the number below.    Please note that while we do our best to be available for urgent issues outside of office hours, we are not available 24/7.   If you have an urgent issue and are unable to reach [place], you may choose to seek medical care at your doctor's office, retail clinic, urgent care center, or emergency room.  If you have a medical emergency, please immediately call 911 or go to the emergency department.  Pager Numbers  - Dr. Nehemiah Massed: [phone]  - Dr. Laurence Ferrari: [phone]  - Dr. Nicole Kindred: [phone]  In the event of inclement weather, please call our main line at [phone] for an update on the status of any delays or closures.  Dermatology Medication Tips: Please keep the boxes that topical medications come in in order to help keep track of the instructions about where and how to use these. Pharmacies typically print the medication instructions only on the boxes and not directly on the  medication tubes.   If your medication is too expensive, please contact our office at [phone] option 4 or send [place] a message through [place].   We are unable to tell what your co-pay for medications will be in advance as this is different depending on your insurance coverage. However, we may be able to find a substitute medication at lower cost or fill out paperwork to get insurance to cover a needed medication.   If a prior authorization is required to get your medication covered by your insurance company, please allow [place] 1-2 business days to complete this process.  Drug prices often vary depending on where the prescription is filled and some pharmacies may offer cheaper prices.  The website www.goodrx.com contains coupons for medications through different pharmacies. The prices here do not account for what the cost may be with help from insurance (it may be cheaper with your insurance), but the website can give you the price if you did not use any insurance.  - You can print the associated coupon and take it with your prescription to the pharmacy.  - You may also stop by our office during regular business hours and pick up a GoodRx coupon card.  - If you need your prescription sent electronically to a different pharmacy, notify our office through [facility] or by phone at [phone] option 4.

## 2021-09-16 ENCOUNTER — Ambulatory Visit (INDEPENDENT_AMBULATORY_CARE_PROVIDER_SITE_OTHER): Payer: Medicare Other | Admitting: Psychology

## 2021-09-16 DIAGNOSIS — F4323 Adjustment disorder with mixed anxiety and depressed mood: Secondary | ICD-10-CM

## 2021-09-17 ENCOUNTER — Ambulatory Visit: Payer: Medicare Other

## 2021-09-17 DIAGNOSIS — M542 Cervicalgia: Secondary | ICD-10-CM | POA: Diagnosis not present

## 2021-09-17 DIAGNOSIS — R262 Difficulty in walking, not elsewhere classified: Secondary | ICD-10-CM

## 2021-09-17 DIAGNOSIS — M79672 Pain in left foot: Secondary | ICD-10-CM

## 2021-09-17 NOTE — Therapy (Signed)
[place] [facility] [address], [place], [address] Phone: [phone]   Fax:  [phone]  Physical Therapy Treatment  Patient Details  Name: Beth Rodgers MRN: [id number] Date of Birth: September 01, 1935 Referring Provider (PT): Owens Loffler, MD   Encounter Date: 09/17/2021   PT End of Session - 09/17/21 1419     Visit Number 5    Number of Visits 17    Date for PT Re-Evaluation 10/30/21    Authorization Type 5    Authorization Time Period 10    PT Start Time 1419    PT Stop Time 1500    PT Time Calculation (min) 41 min    Activity Tolerance Patient tolerated treatment well    Behavior During Therapy [facility] for tasks assessed/performed             Past Medical History:  Diagnosis Date   Basal cell carcinoma ([place])    txted with MOHs in past   Cellulitis 08/10/2018   Chronic venous insufficiency    with varicose veins   Family history of breast cancer    Family history of thyroid cancer    GERD (gastroesophageal reflux disease)    Glaucoma    History  of basal cell carcinoma    left nare/BREAST CANCER   History of chicken pox    Hypertension    Hypoglycemia    Hypothyroid    Osteoarthritis    back pain, L knee pain   Osteopenia 06/2009   DEXA 11/2015: T -2.3 hip, -2.2 spine, on longterm bisphosphonate/evista   Psoriasis    Pyogenic granuloma 04/16/2016    Past Surgical History:  Procedure Laterality Date   BASAL CELL CARCINOMA EXCISION     located on face   BREAST BIOPSY Left    core done ing [place]?   BREAST BIOPSY Left 08/16/2020   3 area bx 4:00 X, IMC, 6:00Q IMC, LN hydro #3-benign   BREAST LUMPECTOMY Left 09/11/2020   two areas of [facility]   CATARACT EXTRACTION     bilateral   DEXA  06/2009   T score Spine: -1.8, hip -2.0   EXCISION OF BREAST BIOPSY Left 09/11/2020   Procedure: EXCISION OF BREAST BIOPSY;  Surgeon: [name], MD;  Location: [facility] ORS;  Service: General;   Laterality: Left;   Foam sclerotherapy Bilateral 09/2015   Reed [place], [place]   MASTECTOMY W/ SENTINEL NODE BIOPSY Left 06/25/2021   Procedure: MASTECTOMY WITH SENTINEL LYMPH NODE BIOPSY;  Surgeon: [name], MD;  Location: [facility] ORS;  Service: General;  Laterality: Left;   NASAL RECONSTRUCTION  2005   for [place] L nare s/p Mohs   nuclear stress test  2014   normal in [place] BX Left 09/11/2020   Procedure: PARTIAL MASTECTOMY WITH NEEDLE LOCALIZATION AND AXILLARY SENTINEL LYMPH NODE [place];  Surgeon: [name], MD;  Location: [facility] ORS;  Service: General;  Laterality: Left;   RADIOFREQUENCY ABLATION Left 01/2012   remnant L G saphenous vein below knee   RADIOFREQUENCY ABLATION Right 02/2013   RLE vein ablation    VEIN LIGATION AND STRIPPING  remote   bilateral G saphenous veins    There were no vitals filed for this visit.   Subjective Assessment - 09/17/21 1421     Subjective Pt states that she is doing better. Walking better. Was able to walk in the gardent. Feels L medial heel and  ankle sorness (pt points to tibialis posterior tendon). 4/10 L heel pain when walking currently.    Pertinent History Neck and achilles pain. L knee also bothers her. Had injection L knee which helped 10 days before her breast surgery June 25, 2021. Dr. [name] also states Pt needs surgery for her L knee (TKA). Pt however has a heel spur on L foot which is chronic. Went to 3 specialists for heel spur and was told that there is nothing can be done surgically. The heel pain however affects her gait which affects her L knee increasing L knee pain. Pt continues to do her HEP from previous PT. Neck pain began about 6 months ago gradually. Started feeling tightness from R medial shoulder blade to neck. Pt is R hand dominant. L foot bothers her more currently.    Patient Stated Goals Learn how to mitigate L heel pain.     Currently in Pain? Yes    Pain Score 4     Pain Onset More than a month ago                                        PT Education - 09/17/21 1447     Education Details ther-ex    Person(s) Educated Patient    Methods Explanation;Demonstration;Tactile cues;Verbal cues    Comprehension Returned demonstration;Verbalized understanding           Objective       B varicose veins legs Osteopenia     L heel pain Hx of L mastectomy on June 25, 2021 and had home health PT for recovery.      Started using a SPC 5 months ago when ambulating by herself. Furniture walk at home.    Medbridge Access Code [email or web address]     Manual therapy  Seated STM/cross friction technique L heel/Achilles area to promote blood flow as well as decrease fascial restrictions.    Seated STM L plantar foot to decrease fascial restrictions    Therapeutic exercise   Seated manually resisted L ankle PF isometrics 40% effort, PT resistance 1 min x 1, then 1 min x 4 with added IV secondary to possible tibialis posterior involvement.   Supine manually resisted L hip abduction isometrics 10x5 seconds in neutral for 3 sets             Padding needed secondary to sensitivity of veins/discomfort     Hooklying hip extension, leg straight              L 10x5 seconds for 3 sets             Seated L leg extension/neural flossing 10x2  L knee discomfort with extension.        Improved exercise technique, movement at target joints, use of target muscles after mod verbal, visual, tactile cues.        Response to treatment Pt states L heel feels better after session. Still a d 4/10 but not an uncomfortable 4/10     Clinical Impression Decreased starting pain level to 4/10 today. Possible L tibialis posterior involvement based on subjective reports. Added isometric loading to aforementioned muscle as well to promote healing. Continued working on improving glute strength as well to  promote better mechanics at her L foot during gait.  Pt will benefit from continued skilled physical therapy services to decrease pain, improve strength and function.  PT Short Term Goals - 09/04/21 1822       PT SHORT TERM GOAL #1   Title Patient will be independent with her initial HEP to improve strength, decrease neck and L heel pain, improve function.    Time 3    Period Weeks    Status New    Target Date 09/25/21               PT Long Term Goals - 09/04/21 1823       PT LONG TERM GOAL #1   Title Pt will have a decrease in L heel pain to 4/10 or less at worst to promote ability to ambulate, perform standing tasks more comfortably.    Baseline 8/10 L heel/Achilles pain at worst for the past 3 months (09/04/2021)    Time 8    Period Weeks    Status New    Target Date 10/30/21      PT LONG TERM GOAL #2   Title Pt will improve bilateral hip extension and abduction strength by at least 1/2 MMT grade to promote femoral control and ability to ambulate and perform closed chain tasks with less L heel/Achilles pain.    Baseline Hip extension 4-/5 R, 3+/5 L, hip abduction 3-/5 R, 4-/5 L (09/04/2021)    Time 8    Period Weeks    Status New    Target Date 10/30/21      PT LONG TERM GOAL #3   Title Pt will improve her L foot FOTO score by at least 10 points as a demonstration of improved function.    Baseline L foot FOTO 38 (09/04/2021)    Time 8    Period Weeks    Status New    Target Date 10/30/21      PT LONG TERM GOAL #4   Title Pt will have a decrease in neck pain to 2/10 or less at worst to promote ability to look around more comfortably.    Baseline 5/10 neck pain at worst for the past 3 months (09/04/2021)    Time 8    Period Weeks    Status New    Target Date 10/30/21                   Plan - 09/17/21 1447     Clinical Impression Statement Decreased starting pain level to 4/10 today. Possible L tibialis posterior involvement based on subjective  reports. Added isometric loading to aforementioned muscle as well to promote healing. Continued working on improving glute strength as well to promote better mechanics at her L foot during gait.  Pt will benefit from continued skilled physical therapy services to decrease pain, improve strength and function.    Personal Factors and Comorbidities Age;Comorbidity 3+;Time since onset of injury/illness/exacerbation;Fitness    Comorbidities Osteopenia, chronic venous insufficiency,Hx of basal cell CA S/P lumpectomy, HTN    Examination-Activity Limitations Stand;Locomotion Level;Squat;Stairs;Carry;Lift    Stability/Clinical Decision Making Stable/Uncomplicated    Rehab Potential Fair    Clinical Impairments Affecting Rehab Potential Chronicity of condition, age, fitness level    PT Frequency 2x / week    PT Duration 8 weeks    PT Treatment/Interventions Aquatic Therapy;Electrical Stimulation;Iontophoresis 4mg /ml Dexamethasone;Gait training;Stair training;Functional mobility training;Therapeutic activities;Therapeutic exercise;Balance training;Neuromuscular re-education;Patient/family education;Manual techniques;Dry needling   electrical stimulation related treatment and ultrasound if appropriate   PT Next Visit Plan isometric plantar flexion, glute, trunk, scapular strengthening, femoral control, manual techniques, modalities PRN    Consulted and Agree  with Plan of Care Patient             Patient will benefit from skilled therapeutic intervention in order to improve the following deficits and impairments:  Postural dysfunction, Decreased strength, Improper body mechanics, Pain, Difficulty walking  Visit Diagnosis: Pain in left foot  Difficulty in walking, not elsewhere classified  Cervicalgia     Problem List Patient Active Problem List   Diagnosis Date Noted   Goals of care, counseling/discussion 06/25/2021   Breast cancer ([place]) 06/25/2021   Pre-op evaluation 06/02/2021   Right wrist  pain 02/28/2021   Skin rash 02/28/2021   Ascending aorta dilatation ([place]) 01/10/2021   Left-sided chest pain 01/10/2021   Genetic testing 10/30/2020   Malignant neoplasm of overlapping sites of left breast in female, estrogen receptor positive ([place]) 10/22/2020   Family history of breast cancer    Family history of thyroid cancer    Insertional Achilles tendinopathy 08/28/2020   Breast cancer, left breast ([place]) 08/01/2020   Right carotid bruit 06/05/2020   Chest pressure 06/03/2020   Left Achilles tendinitis 09/01/2019   Low back pain 08/29/2019   Lumbosacral spondylosis without myelopathy 08/29/2019   Calcaneal spur 08/17/2019   Contusion of knee 10/04/2018   Varicose veins of bilateral lower extremities with pain 03/30/2018   Lymphedema 03/30/2018   Dizziness 02/02/2017   Situational depression 12/18/2016   Vitamin D deficiency 10/11/2016   Fatigue 08/12/2016   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 04/14/2016   Essential hypertension 12/17/2015   Dermatitis of external ear 12/17/2015   Corn of foot 05/16/2015   Advanced care planning/counseling discussion 10/30/2014   Neck pain on right side 06/25/2014   Primary osteoarthritis of left knee 08/17/2013   Oropharyngeal dysphagia 11/15/2012   Stressful life events affecting family and household 03/30/2012   Medicare annual wellness visit, subsequent 03/30/2012   History of basal cell carcinoma    Osteoarthritis    Osteopenia    Hypothyroidism    Glaucoma    Chronic venous insufficiency     Joneen Boers PT, DPT   09/17/2021, 6:17 PM  [place] [facility] [name], [place], [address] Phone: [phone]   Fax:  [phone]  Name: Beth Rodgers MRN: [id number] Date of Birth: 05-29-1935

## 2021-09-19 DIAGNOSIS — M7662 Achilles tendinitis, left leg: Secondary | ICD-10-CM | POA: Diagnosis not present

## 2021-09-19 DIAGNOSIS — M79672 Pain in left foot: Secondary | ICD-10-CM | POA: Diagnosis not present

## 2021-09-22 DIAGNOSIS — Z23 Encounter for immunization: Secondary | ICD-10-CM | POA: Diagnosis not present

## 2021-09-23 ENCOUNTER — Ambulatory Visit: Payer: Medicare Other

## 2021-09-23 DIAGNOSIS — R262 Difficulty in walking, not elsewhere classified: Secondary | ICD-10-CM | POA: Diagnosis not present

## 2021-09-23 DIAGNOSIS — M79672 Pain in left foot: Secondary | ICD-10-CM

## 2021-09-23 DIAGNOSIS — M542 Cervicalgia: Secondary | ICD-10-CM | POA: Diagnosis not present

## 2021-09-23 NOTE — Patient Instructions (Addendum)
Access Code: [email or web address] URL: [email or web address] Date: 09/23/2021 Prepared by: Joneen Boers  Exercises Seated Hip External Rotation AROM - 1 x daily - 7 x weekly - 2 sets - 10 reps Standing Hip Abduction with Counter Support - 1 x daily - 7 x weekly - 2-3 sets - 10 reps Seated Eccentric Ankle Plantar Flexion with Resistance - Straight Leg - 1 x daily - 7 x weekly - 3 sets - 10 reps    Sitting on elevated mat table, heel on furniture slider  Hip abduction    L 10x3   R 10x3   Reviewed and given as part of her HEP. Pt demonstrated and verbalized understanding. Handout provided.

## 2021-09-23 NOTE — Therapy (Signed)
[place] [facility] [address], [place], [address] Phone: [phone]   Fax:  [phone]  Physical Therapy Treatment  Patient Details  Name: Beth Rodgers MRN: [id number] Date of Birth: [birth year] Referring Provider (PT): Owens Loffler, MD   Encounter Date: 09/23/2021   PT End of Session - 09/23/21 1547     Visit Number 6    Number of Visits 17    Date for PT Re-Evaluation 10/30/21    Authorization Type 6    Authorization Time Period 10    PT Start Time 1548    PT Stop Time 1627    PT Time Calculation (min) 39 min    Activity Tolerance Patient tolerated treatment well    Behavior During Therapy [facility] for tasks assessed/performed             Past Medical History:  Diagnosis Date   Basal cell carcinoma ([place])    txted with MOHs in past   Cellulitis 08/10/2018   Chronic venous insufficiency    with varicose veins   Family history of breast cancer    Family history of thyroid cancer    GERD (gastroesophageal reflux disease)    Glaucoma    History  of basal cell carcinoma    left nare/BREAST CANCER   History of chicken pox    Hypertension    Hypoglycemia    Hypothyroid    Osteoarthritis    back pain, L knee pain   Osteopenia 06/2009   DEXA 11/2015: T -2.3 hip, -2.2 spine, on longterm bisphosphonate/evista   Psoriasis    Pyogenic granuloma 04/16/2016    Past Surgical History:  Procedure Laterality Date   BASAL CELL CARCINOMA EXCISION     located on face   BREAST BIOPSY Left    core done ing [place]?   BREAST BIOPSY Left 08/16/2020   3 area bx 4:00 X, IMC, 6:00Q IMC, LN hydro #3-benign   BREAST LUMPECTOMY Left 09/11/2020   two areas of [facility]   CATARACT EXTRACTION     bilateral   DEXA  06/2009   T score Spine: -1.8, hip -2.0   EXCISION OF BREAST BIOPSY Left 09/11/2020   Procedure: EXCISION OF BREAST BIOPSY;  Surgeon: [name], MD;  Location: [facility] ORS;  Service: General;   Laterality: Left;   Foam sclerotherapy Bilateral 09/2015   [place], [place]   MASTECTOMY W/ SENTINEL NODE BIOPSY Left 06/25/2021   Procedure: MASTECTOMY WITH SENTINEL LYMPH NODE BI[place];  Surgeon: [name], MD;  Location: [facility] ORS;  Service: General;  Laterality: Left;   NASAL RECONSTRUCTION  2005   for [place] L nare s/p Mohs   nuclear stress test  2014   normal in [place] BX Left 09/11/2020   Procedure: PARTIAL MASTECTOMY WITH NEEDLE LOCALIZATION AND AXILLARY SENTINEL LYMPH NODE [place];  Surgeon: [name], MD;  Location: [facility] ORS;  Service: General;  Laterality: Left;   RADIOFREQUENCY ABLATION Left 01/2012   remnant L G saphenous vein below knee   RADIOFREQUENCY ABLATION Right 02/2013   RLE vein ablation    VEIN LIGATION AND STRIPPING  remote   bilateral G saphenous veins    There were no vitals filed for this visit.   Subjective Assessment - 09/23/21 1550     Subjective L heel is hurting right now. Stopped wearing the shoes except with going to the garden. Using her sandals with a  little bit of a heel in the back to see if it is comfortable enough. Bought new shoes and will bring them next time. 4.5/10 L heel pain when walking.    Pertinent History Neck and achilles pain. L knee also bothers her. Had injection L knee which helped 10 days before her breast surgery June 25, 2021. Dr. [name] also states Pt needs surgery for her L knee (TKA). Pt however has a heel spur on L foot which is chronic. Went to 3 specialists for heel spur and was told that there is nothing can be done surgically. The heel pain however affects her gait which affects her L knee increasing L knee pain. Pt continues to do her HEP from previous PT. Neck pain began about 6 months ago gradually. Started feeling tightness from R medial shoulder blade to neck. Pt is R hand dominant. L foot bothers her more currently.     Patient Stated Goals Learn how to mitigate L heel pain.    Currently in Pain? Yes    Pain Score 5     Pain Onset More than a month ago                                        PT Education - 09/23/21 1612     Education Details ther-ex, HEP    Person(s) Educated Patient    Methods Explanation;Demonstration;Tactile cues;Verbal cues;Handout    Comprehension Returned demonstration;Verbalized understanding             Objective     No latex band allergies   B varicose veins legs Osteopenia     L heel pain Hx of L mastectomy on June 25, 2021 and had home health PT for recovery.      Started using a SPC 5 months ago when ambulating by herself. Furniture walk at home.    Medbridge Access Code [email or web address]     Manual therapy  Seated STM/cross friction technique L heel/Achilles area to promote blood flow as well as decrease fascial restrictions.    Seated STM L plantar foot to decrease fascial restrictions     Therapeutic exercise   Seated manually resisted L ankle PF isometrics 40% effort, PT resistance 1 min x 1, then 1 min x 4 with added IV secondary to possible tibialis posterior involvement.    Seated L ankle PF 10x3  Sitting on elevated mat table, heel on furniture slider  Hip abduction    L 10x3   R 10x3   Reviewed and given as part of her HEP. Pt demonstrated and verbalized understanding. Handout provided.   Seated L ankle PF eccentric yellow band 10x3        Improved exercise technique, movement at target joints, use of target muscles after mod verbal, visual, tactile cues.        Response to treatment Pt states L heel feels better after session.      Clinical Impression Continued working on improving blood flow to L heel, followed by isometric and concentric loading to promote healing. Added light eccentric loading via the yellow theraband to progress level of appropriate stress needed for tendon healing. Decreased L heel  pain from 4.5/10 to 4/10 with gait after session. Pt will benefit from continued skilled physical therapy services to decrease pain, improve strength and function.      PT Short Term Goals - 09/04/21 3546  PT SHORT TERM GOAL #1   Title Patient will be independent with her initial HEP to improve strength, decrease neck and L heel pain, improve function.    Time 3    Period Weeks    Status New    Target Date 09/25/21               PT Long Term Goals - 09/04/21 1823       PT LONG TERM GOAL #1   Title Pt will have a decrease in L heel pain to 4/10 or less at worst to promote ability to ambulate, perform standing tasks more comfortably.    Baseline 8/10 L heel/Achilles pain at worst for the past 3 months (09/04/2021)    Time 8    Period Weeks    Status New    Target Date 10/30/21      PT LONG TERM GOAL #2   Title Pt will improve bilateral hip extension and abduction strength by at least 1/2 MMT grade to promote femoral control and ability to ambulate and perform closed chain tasks with less L heel/Achilles pain.    Baseline Hip extension 4-/5 R, 3+/5 L, hip abduction 3-/5 R, 4-/5 L (09/04/2021)    Time 8    Period Weeks    Status New    Target Date 10/30/21      PT LONG TERM GOAL #3   Title Pt will improve her L foot FOTO score by at least 10 points as a demonstration of improved function.    Baseline L foot FOTO 38 (09/04/2021)    Time 8    Period Weeks    Status New    Target Date 10/30/21      PT LONG TERM GOAL #4   Title Pt will have a decrease in neck pain to 2/10 or less at worst to promote ability to look around more comfortably.    Baseline 5/10 neck pain at worst for the past 3 months (09/04/2021)    Time 8    Period Weeks    Status New    Target Date 10/30/21                   Plan - 09/23/21 1612     Clinical Impression Statement Continued working on improving blood flow to L heel, followed by isometric and concentric loading to promote healing.  Added light eccentric loading via the yellow theraband to progress level of appropriate stress needed for tendon healing. Decreased L heel pain from 4.5/10 to 4/10 with gait after session. Pt will benefit from continued skilled physical therapy services to decrease pain, improve strength and function.    Personal Factors and Comorbidities Age;Comorbidity 3+;Time since onset of injury/illness/exacerbation;Fitness    Comorbidities Osteopenia, chronic venous insufficiency,Hx of basal cell CA S/P lumpectomy, HTN    Examination-Activity Limitations Stand;Locomotion Level;Squat;Stairs;Carry;Lift    Stability/Clinical Decision Making Stable/Uncomplicated    Rehab Potential Fair    Clinical Impairments Affecting Rehab Potential Chronicity of condition, age, fitness level    PT Frequency 2x / week    PT Duration 8 weeks    PT Treatment/Interventions Aquatic Therapy;Electrical Stimulation;Iontophoresis 4mg /ml Dexamethasone;Gait training;Stair training;Functional mobility training;Therapeutic activities;Therapeutic exercise;Balance training;Neuromuscular re-education;Patient/family education;Manual techniques;Dry needling   electrical stimulation related treatment and ultrasound if appropriate   PT Next Visit Plan isometric plantar flexion, glute, trunk, scapular strengthening, femoral control, manual techniques, modalities PRN    Consulted and Agree with Plan of Care Patient  Patient will benefit from skilled therapeutic intervention in order to improve the following deficits and impairments:  Postural dysfunction, Decreased strength, Improper body mechanics, Pain, Difficulty walking  Visit Diagnosis: Pain in left foot  Difficulty in walking, not elsewhere classified     Problem List Patient Active Problem List   Diagnosis Date Noted   Goals of care, counseling/discussion 06/25/2021   Breast cancer ([place]) 06/25/2021   Pre-op evaluation 06/02/2021   Right wrist pain 02/28/2021    Skin rash 02/28/2021   Ascending aorta dilatation ([place]) 01/10/2021   Left-sided chest pain 01/10/2021   Genetic testing 10/30/2020   Malignant neoplasm of overlapping sites of left breast in female, estrogen receptor positive ([place]) 10/22/2020   Family history of breast cancer    Family history of thyroid cancer    Insertional Achilles tendinopathy 08/28/2020   Breast cancer, left breast ([place]) 08/01/2020   Right carotid bruit 06/05/2020   Chest pressure 06/03/2020   Left Achilles tendinitis 09/01/2019   Low back pain 08/29/2019   Lumbosacral spondylosis without myelopathy 08/29/2019   Calcaneal spur 08/17/2019   Contusion of knee 10/04/2018   Varicose veins of bilateral lower extremities with pain 03/30/2018   Lymphedema 03/30/2018   Dizziness 02/02/2017   Situational depression 12/18/2016   Vitamin D deficiency 10/11/2016   Fatigue 08/12/2016   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 [facility] [address] [place], [address] Phone: [phone]   Fax:  [phone]  Name: Beth Rodgers MRN: [id number] Date of Birth: 1935/09/11

## 2021-09-25 ENCOUNTER — Ambulatory Visit: Payer: Medicare Other

## 2021-09-25 DIAGNOSIS — M79672 Pain in left foot: Secondary | ICD-10-CM

## 2021-09-25 DIAGNOSIS — M542 Cervicalgia: Secondary | ICD-10-CM

## 2021-09-25 DIAGNOSIS — R262 Difficulty in walking, not elsewhere classified: Secondary | ICD-10-CM

## 2021-09-25 NOTE — Therapy (Signed)
[facility] [address], [place], [address] Phone: [phone]   Fax:  [phone]  Physical Therapy Treatment  Patient Details  Name: Beth Rodgers MRN: [id number] Date of Birth: Dec 11, 1935 Referring Provider (PT): Owens Loffler, MD   Encounter Date: 09/25/2021   PT End of Session - 09/25/21 1105     Visit Number 7    Number of Visits 17    Date for PT Re-Evaluation 10/30/21    Authorization Type 7    Authorization Time Period 10    PT Start Time 1105    PT Stop Time 9935    PT Time Calculation (min) 40 min    Activity Tolerance Patient tolerated treatment well    Behavior During Therapy [facility] for tasks assessed/performed             Past Medical History:  Diagnosis Date   Basal cell carcinoma ([place])    txted with MOHs in past   Cellulitis 08/10/2018   Chronic venous insufficiency    with varicose veins   Family history of breast cancer    Family history of thyroid cancer    GERD (gastroesophageal reflux disease)    Glaucoma    History  of basal cell carcinoma    left nare/BREAST CANCER   History of chicken pox    Hypertension    Hypoglycemia    Hypothyroid    Osteoarthritis    back pain, L knee pain   Osteopenia 06/2009   DEXA 11/2015: T -2.3 hip, -2.2 spine, on longterm bisphosphonate/evista   Psoriasis    Pyogenic granuloma 04/16/2016    Past Surgical History:  Procedure Laterality Date   BASAL CELL CARCINOMA EXCISION     located on face   BREAST BIOPSY Left    core done ing [place]?   BREAST BIOPSY Left 08/16/2020   3 area bx 4:00 X, IMC, 6:00Q IMC, LN hydro #3-benign   BREAST LUMPECTOMY Left 09/11/2020   two areas of [facility]   CATARACT EXTRACTION     bilateral   DEXA  06/2009   T score Spine: -1.8, hip -2.0   EXCISION OF BREAST BIOPSY Left 09/11/2020   Procedure: EXCISION OF BREAST BIOPSY;  Surgeon: [name], MD;  Location: [facility] ORS;  Service: General;   Laterality: Left;   Foam sclerotherapy Bilateral 09/2015   Reed P[place], [place]   MASTECTOMY W/ SENTINEL NODE BIOPSY Left 06/25/2021   Procedure: MASTECTOMY WITH SENTINEL LYMPH NODE BIOPSY;  Surgeon: [name], MD;  Location: [facility] ORS;  Service: General;  Laterality: Left;   NASAL RECONSTRUCTION  2005   for [place] L nare s/p Mohs   nuclear stress test  2014   normal in [place] BX Left 09/11/2020   Procedure: PARTIAL MASTECTOMY WITH NEEDLE LOCALIZATION AND AXILLARY SENTINEL LYMPH NODE [place];  Surgeon: [name], MD;  Location: [facility] ORS;  Service: General;  Laterality: Left;   RADIOFREQUENCY ABLATION Left 01/2012   remnant L G saphenous vein below knee   RADIOFREQUENCY ABLATION Right 02/2013   RLE vein ablation    VEIN LIGATION AND STRIPPING  remote   bilateral G saphenous veins    There were no vitals filed for this visit.   Subjective Assessment - 09/25/21 1108     Subjective Not bad today. L heel is about 4/10 currently. Put a little bit of topical heel medication this morning. Pt thinks  she is improving.    Pertinent History Neck and achilles pain. L knee also bothers her. Had injection L knee which helped 10 days before her breast surgery June 25, 2021. Dr. [name] also states Pt needs surgery for her L knee (TKA). Pt however has a heel spur on L foot which is chronic. Went to 3 specialists for heel spur and was told that there is nothing can be done surgically. The heel pain however affects her gait which affects her L knee increasing L knee pain. Pt continues to do her HEP from previous PT. Neck pain began about 6 months ago gradually. Started feeling tightness from R medial shoulder blade to neck. Pt is R hand dominant. L foot bothers her more currently.    Patient Stated Goals Learn how to mitigate L heel pain.    Currently in Pain? Yes    Pain Score 4     Pain Onset More than a month  ago                                        PT Education - 09/25/21 1420     Education Details thre-ex    [employer]) Educated Patient    Methods Explanation;Demonstration;Tactile cues;Verbal cues    Comprehension Returned demonstration;Verbalized understanding             Objective     No latex band allergies   B varicose veins legs Osteopenia     L heel pain Hx of L mastectomy on June 25, 2021 and had home health PT for recovery.      Started using a SPC 5 months ago when ambulating by herself. Furniture walk at home.    Medbridge Access Code [email or web address]     Manual therapy   Seated STM/cross friction technique L heel/Achilles area to promote blood flow as well as decrease fascial restrictions.    Seated STM L plantar foot to decrease fascial restrictions     Therapeutic exercise   Seated manually resisted L ankle PF isometrics 40% effort, PT resistance 1 min x 1, then 1 min x 4 with added IV secondary to possible tibialis posterior involvement.    Seated manually resisted L ankle PF eccentric 10x2 with PT  Seated L ankle PF 10x3   Sitting on elevated mat table, heel on furniture slider             Hip abduction with PT manual resistance                         L 10x3                        Improved exercise technique, movement at target joints, use of target muscles after mod verbal, visual, tactile cues.        Response to treatment Pt tolerated session well without aggravation of symptoms. Pt states L heel feels more comfortable after session.      Clinical Impression Starting L heel pain level seems to be stabilizing to a 4/10 compared to the 6-5/10 when pt first started PT this month. Continued working on decreasing soft tissue restrictions, improving blood flow as well as applying appropriate stress to the affected areas to promote healing. Also worked on [employer] to promote better mechanics throughout her LE to  decrease excessive stress to  her Achilles and heel. Pt states L heel feels more comfortable during gait after session. Pt will benefit from continued skilled physical therapy services to decrease pain, improve strength and function.    PT Short Term Goals - 09/04/21 1822       PT SHORT TERM GOAL #1   Title Patient will be independent with her initial HEP to improve strength, decrease neck and L heel pain, improve function.    Time 3    Period Weeks    Status New    Target Date 09/25/21               PT Long Term Goals - 09/04/21 1823       PT LONG TERM GOAL #1   Title Pt will have a decrease in L heel pain to 4/10 or less at worst to promote ability to ambulate, perform standing tasks more comfortably.    Baseline 8/10 L heel/Achilles pain at worst for the past 3 months (09/04/2021)    Time 8    Period Weeks    Status New    Target Date 10/30/21      PT LONG TERM GOAL #2   Title Pt will improve bilateral hip extension and abduction strength by at least 1/2 MMT grade to promote femoral control and ability to ambulate and perform closed chain tasks with less L heel/Achilles pain.    Baseline Hip extension 4-/5 R, 3+/5 L, hip abduction 3-/5 R, 4-/5 L (09/04/2021)    Time 8    Period Weeks    Status New    Target Date 10/30/21      PT LONG TERM GOAL #3   Title Pt will improve her L foot FOTO score by at least 10 points as a demonstration of improved function.    Baseline L foot FOTO 38 (09/04/2021)    Time 8    Period Weeks    Status New    Target Date 10/30/21      PT LONG TERM GOAL #4   Title Pt will have a decrease in neck pain to 2/10 or less at worst to promote ability to look around more comfortably.    Baseline 5/10 neck pain at worst for the past 3 months (09/04/2021)    Time 8    Period Weeks    Status New    Target Date 10/30/21                   Plan - 09/25/21 1420     Clinical Impression Statement Starting L heel pain level seems to be stabilizing  to a 4/10 compared to the 6-5/10 when pt first started PT this month. Continued working on decreasing soft tissue restrictions, improving blood flow as well as applying appropriate stress to the affected areas to promote healing. Also worked on [employer] to promote better mechanics throughout her LE to decrease excessive stress to her Achilles and heel. Pt states L heel feels more comfortable during gait after session. Pt will benefit from continued skilled physical therapy services to decrease pain, improve strength and function.    Personal Factors and Comorbidities Age;Comorbidity 3+;Time since onset of injury/illness/exacerbation;Fitness    Comorbidities Osteopenia, chronic venous insufficiency,Hx of basal cell CA S/P lumpectomy, HTN    Examination-Activity Limitations Stand;Locomotion Level;Squat;Stairs;Carry;Lift    Stability/Clinical Decision Making Stable/Uncomplicated    Rehab Potential Fair    Clinical Impairments Affecting Rehab Potential Chronicity of condition, age, fitness level    PT Frequency  2x / week    PT Duration 8 weeks    PT Treatment/Interventions Aquatic Therapy;Electrical Stimulation;Iontophoresis 4mg /ml Dexamethasone;Gait training;Stair training;Functional mobility training;Therapeutic activities;Therapeutic exercise;Balance training;Neuromuscular re-education;Patient/family education;Manual techniques;Dry needling   electrical stimulation related treatment and ultrasound if appropriate   PT Next Visit Plan isometric plantar flexion, glute, trunk, scapular strengthening, femoral control, manual techniques, modalities PRN    Consulted and Agree with Plan of Care Patient             Patient will benefit from skilled therapeutic intervention in order to improve the following deficits and impairments:  Postural dysfunction, Decreased strength, Improper body mechanics, Pain, Difficulty walking  Visit Diagnosis: Pain in left foot  Difficulty in walking, not  elsewhere classified  Cervicalgia     Problem List Patient Active Problem List   Diagnosis Date Noted   Goals of care, counseling/discussion 06/25/2021   Breast cancer ([place]) 06/25/2021   Pre-op evaluation 06/02/2021   Right wrist pain 02/28/2021   Skin rash 02/28/2021   Ascending aorta dilatation ([place]) 01/10/2021   Left-sided chest pain 01/10/2021   Genetic testing 10/30/2020   Malignant neoplasm of overlapping sites of left breast in female, estrogen receptor positive ([place]) 10/22/2020   Family history of breast cancer    Family history of thyroid cancer    Insertional Achilles tendinopathy 08/28/2020   Breast cancer, left breast ([place]) 08/01/2020   Right carotid bruit 06/05/2020   Chest pressure 06/03/2020   Left Achilles tendinitis 09/01/2019   Low back pain 08/29/2019   Lumbosacral spondylosis without myelopathy 08/29/2019   Calcaneal spur 08/17/2019   Contusion of knee 10/04/2018   Varicose veins of bilateral lower extremities with pain 03/30/2018   Lymphedema 03/30/2018   Dizziness 02/02/2017   Situational depression 12/18/2016   Vitamin D deficiency 10/11/2016   Fatigue 08/12/2016   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 04/14/2016   Essential hypertension 12/17/2015   Dermatitis of external ear 12/17/2015   Corn of foot 05/16/2015   Advanced care planning/counseling discussion 10/30/2014   Neck pain on right side 06/25/2014   Primary osteoarthritis of left knee 08/17/2013   Oropharyngeal dysphagia 11/15/2012   Stressful life events affecting family and household 03/30/2012   Medicare annual wellness visit, subsequent 03/30/2012   History of basal cell carcinoma    Osteoarthritis    Osteopenia    Hypothyroidism    Glaucoma    Chronic venous insufficiency     Chelly Dombeck, PT 09/25/2021, 2:27 PM  [facility] [address], [place], [address] Phone: [phone]    Fax:  [phone]  Name: Beth Rodgers MRN: [id number] Date of Birth: Sep 03, 1935

## 2021-09-30 ENCOUNTER — Ambulatory Visit: Payer: Medicare Other

## 2021-09-30 ENCOUNTER — Ambulatory Visit (INDEPENDENT_AMBULATORY_CARE_PROVIDER_SITE_OTHER): Payer: Medicare Other | Admitting: Psychology

## 2021-09-30 DIAGNOSIS — F4323 Adjustment disorder with mixed anxiety and depressed mood: Secondary | ICD-10-CM | POA: Diagnosis not present

## 2021-10-02 ENCOUNTER — Ambulatory Visit: Payer: Medicare Other | Attending: Family Medicine

## 2021-10-02 DIAGNOSIS — M79672 Pain in left foot: Secondary | ICD-10-CM | POA: Insufficient documentation

## 2021-10-02 DIAGNOSIS — M542 Cervicalgia: Secondary | ICD-10-CM | POA: Insufficient documentation

## 2021-10-02 DIAGNOSIS — R262 Difficulty in walking, not elsewhere classified: Secondary | ICD-10-CM | POA: Insufficient documentation

## 2021-10-02 NOTE — Therapy (Signed)
[facility] [address], [place], [address] Phone: [phone]   Fax:  [phone]  Physical Therapy Treatment  Patient Details  Name: Beth Rodgers MRN: [id number] Date of Birth: 1935-12-27 Referring Provider (PT): Owens Loffler, MD   Encounter Date: 10/02/2021   PT End of Session - 10/02/21 1422     Visit Number 8    Number of Visits 17    Date for PT Re-Evaluation 10/30/21    Authorization Type 8    Authorization Time Period 10    PT Start Time 1422    PT Stop Time 1500    PT Time Calculation (min) 38 min    Activity Tolerance Patient tolerated treatment well    Behavior During Therapy [facility] for tasks assessed/performed             Past Medical History:  Diagnosis Date   Basal cell carcinoma ([place])    txted with MOHs in past   Cellulitis 08/10/2018   Chronic venous insufficiency    with varicose veins   Family history of breast cancer    Family history of thyroid cancer    GERD (gastroesophageal reflux disease)    Glaucoma    History  of basal cell carcinoma    left nare/BREAST CANCER   History of chicken pox    Hypertension    Hypoglycemia    Hypothyroid    Osteoarthritis    back pain, L knee pain   Osteopenia 06/2009   DEXA 11/2015: T -2.3 hip, -2.2 spine, on longterm bisphosphonate/evista   Psoriasis    Pyogenic granuloma 04/16/2016    Past Surgical History:  Procedure Laterality Date   BASAL CELL CARCINOMA EXCISION     located on face   BREAST BIOPSY Left    core done ing [place]?   BREAST BIOPSY Left 08/16/2020   3 area bx 4:00 X, IMC, 6:00Q IMC, LN hydro #3-benign   BREAST LUMPECTOMY Left 09/11/2020   two areas of [facility]   CATARACT EXTRACTION     bilateral   DEXA  06/2009   T score Spine: -1.8, hip -2.0   EXCISION OF BREAST BIOPSY Left 09/11/2020   Procedure: EXCISION OF BREAST BIOPSY;  Surgeon: [name], MD;  Location: [facility] ORS;  Service: General;   Laterality: Left;   Foam sclerotherapy Bilateral 09/2015   [place], [place]   MASTECTOMY W/ SENTINEL NODE BIOPSY Left 06/25/2021   Procedure: MASTECTOMY WITH SENTINEL LYMPH NODE BIOPSY;  Surgeon: [name], MD;  Location: [facility] ORS;  Service: General;  Laterality: Left;   NASAL RECONSTRUCTION  2005   for [place] L nare s/p Mohs   nuclear stress test  2014   normal in [place] BX Left 09/11/2020   Procedure: PARTIAL MASTECTOMY WITH NEEDLE LOCALIZATION AND AXILLARY SENTINEL LYMPH NODE [place];  Surgeon: [name], MD;  Location: [facility] ORS;  Service: General;  Laterality: Left;   RADIOFREQUENCY ABLATION Left 01/2012   remnant L G saphenous vein below knee   RADIOFREQUENCY ABLATION Right 02/2013   RLE vein ablation    VEIN LIGATION AND STRIPPING  remote   bilateral G saphenous veins    There were no vitals filed for this visit.   Subjective Assessment - 10/02/21 1423     Subjective L heel is not so bad today. Was out of sorts yesterday because she fell on Tuesday. Pt tripped on the  wired of the charger. She fell onto her R knee. Did not break anything. Fell onto carpet. 3/10 L heel pain currently while walking. Usually uses her pain ointment daily when she remembers. Has also been doing her exercises.    Pertinent History Neck and achilles pain. L knee also bothers her. Had injection L knee which helped 10 days before her breast surgery June 25, 2021. Dr. [name] also states Pt needs surgery for her L knee (TKA). Pt however has a heel spur on L foot which is chronic. Went to 3 specialists for heel spur and was told that there is nothing can be done surgically. The heel pain however affects her gait which affects her L knee increasing L knee pain. Pt continues to do her HEP from previous PT. Neck pain began about 6 months ago gradually. Started feeling tightness from R medial shoulder blade to neck. Pt  is R hand dominant. L foot bothers her more currently.    Patient Stated Goals Learn how to mitigate L heel pain.    Currently in Pain? Yes    Pain Score 3     Pain Onset More than a month ago                                        PT Education - 10/02/21 1504     Education Details ther-ex    Person(s) Educated Patient    Methods Explanation;Demonstration;Tactile cues;Verbal cues    Comprehension Returned demonstration;Verbalized understanding            Objective     No latex band allergies   B varicose veins legs Osteopenia     L heel pain Hx of L mastectomy on June 25, 2021 and had home health PT for recovery.      Started using a SPC 5 months ago when ambulating by herself. Furniture walk at home.    [employer] Access Code [email or web address]     Manual therapy   Seated STM/cross friction technique L heel/Achilles area to promote blood flow as well as decrease fascial restrictions.    Seated STM L plantar foot to decrease fascial restrictions     Therapeutic exercise   Seated manually resisted L ankle PF isometrics 40% effort, PT resistance 1 min x 5    Sitting on elevated mat table, heel on furniture slider             Hip abduction with PT manual resistance                         L 10x3  Seated L ankle PF 10x3                      Seated manually resisted L ankle PF eccentric 10x3 with PT     Improved exercise technique, movement at target joints, use of target muscles after mod verbal, visual, tactile cues.        Response to treatment Pt tolerated session well without aggravation of symptoms. Pt states her L heel feels better after session.       Clinical Impression Decreased starting pain level to 3/10 today. Continued working on decreasing soft tissue restrictions, improving blood flow as well as applying appropriate stress to the affected areas to promote healing. Also worked on [employer] to promote better  [employer] throughout her  LE to decrease excessive stress to her Achilles and heel. Pt will benefit from continued skilled physical therapy services to decrease pain, improve strength and function.        PT Short Term Goals - 09/04/21 1822       PT SHORT TERM GOAL #1   Title Patient will be independent with her initial HEP to improve strength, decrease neck and L heel pain, improve function.    Time 3    Period Weeks    Status New    Target Date 09/25/21               PT Long Term Goals - 09/04/21 1823       PT LONG TERM GOAL #1   Title Pt will have a decrease in L heel pain to 4/10 or less at worst to promote ability to ambulate, perform standing tasks more comfortably.    Baseline 8/10 L heel/Achilles pain at worst for the past 3 months (09/04/2021)    Time 8    Period Weeks    Status New    Target Date 10/30/21      PT LONG TERM GOAL #2   Title Pt will improve bilateral hip extension and abduction strength by at least 1/2 MMT grade to promote femoral control and ability to ambulate and perform closed chain tasks with less L heel/Achilles pain.    Baseline Hip extension 4-/5 R, 3+/5 L, hip abduction 3-/5 R, 4-/5 L (09/04/2021)    Time 8    Period Weeks    Status New    Target Date 10/30/21      PT LONG TERM GOAL #3   Title Pt will improve her L foot FOTO score by at least 10 points as a demonstration of improved function.    Baseline L foot FOTO 38 (09/04/2021)    Time 8    Period Weeks    Status New    Target Date 10/30/21      PT LONG TERM GOAL #4   Title Pt will have a decrease in neck pain to 2/10 or less at worst to promote ability to look around more comfortably.    Baseline 5/10 neck pain at worst for the past 3 months (09/04/2021)    Time 8    Period Weeks    Status New    Target Date 10/30/21                   Plan - 10/02/21 1421     Clinical Impression Statement Decreased starting pain level to 3/10 today. Continued working on decreasing  soft tissue restrictions, improving blood flow as well as applying appropriate stress to the affected areas to promote healing. Also worked on [employer] to promote better mechanics throughout her LE to decrease excessive stress to her Achilles and heel. Pt will benefit from continued skilled physical therapy services to decrease pain, improve strength and function.    Personal Factors and Comorbidities Age;Comorbidity 3+;Time since onset of injury/illness/exacerbation;Fitness    Comorbidities Osteopenia, chronic venous insufficiency,Hx of basal cell CA S/P lumpectomy, HTN    Examination-Activity Limitations Stand;Locomotion Level;Squat;Stairs;Carry;Lift    Stability/Clinical Decision Making Stable/Uncomplicated    Rehab Potential Fair    Clinical Impairments Affecting Rehab Potential Chronicity of condition, age, fitness level    PT Frequency 2x / week    PT Duration 8 weeks    PT Treatment/Interventions Aquatic Therapy;Electrical Stimulation;Iontophoresis 4mg /ml Dexamethasone;Gait training;Stair training;Functional mobility training;Therapeutic activities;Therapeutic exercise;Balance training;Neuromuscular re-education;Patient/family  education;Manual techniques;Dry needling   electrical stimulation related treatment and ultrasound if appropriate   PT Next Visit Plan isometric plantar flexion, glute, trunk, scapular strengthening, femoral control, manual techniques, modalities PRN    Consulted and Agree with Plan of Care Patient             Patient will benefit from skilled therapeutic intervention in order to improve the following deficits and impairments:  Postural dysfunction, Decreased strength, Improper body mechanics, Pain, Difficulty walking  Visit Diagnosis: Pain in left foot  Difficulty in walking, not elsewhere classified     Problem List Patient Active Problem List   Diagnosis Date Noted   Goals of care, counseling/discussion 06/25/2021   Breast cancer ([place])  06/25/2021   Pre-op evaluation 06/02/2021   Right wrist pain 02/28/2021   Skin rash 02/28/2021   Ascending aorta dilatation ([place]) 01/10/2021   Left-sided chest pain 01/10/2021   Genetic testing 10/30/2020   Malignant neoplasm of overlapping sites of left breast in female, estrogen receptor positive ([place]) 10/22/2020   Family history of breast cancer    Family history of thyroid cancer    Insertional Achilles tendinopathy 08/28/2020   Breast cancer, left breast ([place]) 08/01/2020   Right carotid bruit 06/05/2020   Chest pressure 06/03/2020   Left Achilles tendinitis 09/01/2019   Low back pain 08/29/2019   Lumbosacral spondylosis without myelopathy 08/29/2019   Calcaneal spur 08/17/2019   Contusion of knee 10/04/2018   Varicose veins of bilateral lower extremities with pain 03/30/2018   Lymphedema 03/30/2018   Dizziness 02/02/2017   Situational depression 12/18/2016   Vitamin D deficiency 10/11/2016   Fatigue 08/12/2016   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 04/14/2016   Essential hypertension 12/17/2015   Dermatitis of external ear 12/17/2015   Corn of foot 05/16/2015   Advanced care planning/counseling discussion 10/30/2014   Neck pain on right side 06/25/2014   Primary osteoarthritis of left knee 08/17/2013   Oropharyngeal dysphagia 11/15/2012   Stressful life events affecting family and household 03/30/2012   Medicare annual wellness visit, subsequent 03/30/2012   History of basal cell carcinoma    Osteoarthritis    Osteopenia    Hypothyroidism    Glaucoma    Chronic venous insufficiency     Joneen Boers PT, DPT   10/02/2021, 3:04 PM  [facility] [address], [place], [address] Phone: [phone]   Fax:  [phone]  Name: Beth Rodgers MRN: [id number] Date of Birth: 07-02-1935

## 2021-10-03 ENCOUNTER — Telehealth: Payer: Self-pay | Admitting: Family Medicine

## 2021-10-03 DIAGNOSIS — M170 Bilateral primary osteoarthritis of knee: Secondary | ICD-10-CM | POA: Diagnosis not present

## 2021-10-03 NOTE — Telephone Encounter (Signed)
Spoke with pt about sxs.  States sxs started about 09/25/22.  Denies fever, SOB, runny nose or body aches.  Has not been tested for COVID.  States she has same sxs she had this time last year and Dr. Danise Mina prescribed something that worked really well (can't remember the name).  Plz advise. Gives permission to lvm.

## 2021-10-03 NOTE — Telephone Encounter (Signed)
Pt called stating that Dr Darnell Level had prescribed medication for symptoms (coughing, chest congestion, and heavy fleam. Pt states that she would like another prescription. Pt wasn't home so she didn't know the name of the medication.

## 2021-10-03 NOTE — Telephone Encounter (Addendum)
Recommend evaluation for this either at [facility] over weekend or may see me virtually or in office on Monday.

## 2021-10-06 ENCOUNTER — Encounter: Payer: Self-pay | Admitting: Family Medicine

## 2021-10-06 ENCOUNTER — Ambulatory Visit (INDEPENDENT_AMBULATORY_CARE_PROVIDER_SITE_OTHER): Payer: Medicare Other | Admitting: Family Medicine

## 2021-10-06 ENCOUNTER — Other Ambulatory Visit: Payer: Self-pay

## 2021-10-06 VITALS — BP 126/64 | HR 90 | Temp 98.1°F | Ht 63.0 in | Wt 171.2 lb

## 2021-10-06 DIAGNOSIS — J22 Unspecified acute lower respiratory infection: Secondary | ICD-10-CM | POA: Diagnosis not present

## 2021-10-06 DIAGNOSIS — Z170 Estrogen receptor positive status [ER+]: Secondary | ICD-10-CM

## 2021-10-06 DIAGNOSIS — C50812 Malignant neoplasm of overlapping sites of left female breast: Secondary | ICD-10-CM

## 2021-10-06 DIAGNOSIS — M159 Polyosteoarthritis, unspecified: Secondary | ICD-10-CM | POA: Diagnosis not present

## 2021-10-06 MED ORDER — AZITHROMYCIN 250 MG PO TABS: ORAL_TABLET | ORAL | 0 refills | Status: DC

## 2021-10-06 NOTE — Assessment & Plan Note (Signed)
S/p L mastectomy 05/2021, has recovered remarkably well. Did not need post-mastectomy XRT. Planning close onc f/u.

## 2021-10-06 NOTE — Progress Notes (Signed)
Patient ID: Beth Rodgers, female    DOB: 05-Oct-1935, 86 y.o.   MRN: [id number]  This visit was conducted in person.  BP 126/64   Pulse 90   Temp 98.1 F (36.7 C) (Temporal)   Ht 5\' 3"  (1.6 m)   Wt 171 lb 3 oz (77.7 kg)   SpO2 97%   BMI 30.32 kg/m    CC: cough Subjective:   HPI: JANIN KOZLOWSKI is a 86 y.o. female presenting on 10/06/2021 for Cough (C/o prod cough with white mucous and chest congestion.  Denies any other sxs.  Sxs started about 10 days ago. )   Known L breast cancer s/p mastectomy (Cintron-Diaz) 05/2021. Saw oncology Dr [name] Banks afterwards - didn't think would need postmastectomy radiation. Intolerant to previous aromatase inhibitors tried.   11d h/o productive cough of thick white mucous associated with chest congestion. Has coughing fits.   No fevers/chills, dyspnea, chest pain, rhinorrhea, body aches, HA, ST.  No sinus or nasal congestion.  No sick contacts at home.  No h/o asthma or smoking.  COVID vaccine 12/2019, 01/2020, booster 11/2020, bivalent booster 08/2021.   She has gone to see orthopedist in [place] - recommended trial viscosupplementation (Gelsync). She thinks she'd prefer to go ahead with joint replacement.      Relevant past medical, surgical, family and social history reviewed and updated as indicated. Interim medical history since our last visit reviewed. Allergies and medications reviewed and updated. Outpatient Medications Prior to Visit  Medication Sig Dispense Refill   alendronate (FOSAMAX) 70 MG tablet Take 70 mg by mouth every Sunday.     Calcium Carb-Cholecalciferol (CALCIUM 600 + D PO) Take 1 tablet by mouth daily.     calcium carbonate (TUMS EX) 750 MG chewable tablet Chew 750 mg by mouth daily as needed for heartburn.     Cholecalciferol (VITAMIN D) 50 MCG (2000 UT) CAPS Take 1 capsule (2,000 Units total) by mouth daily. 30 capsule    Ciclopirox 0.77 % gel Apply 1 application topically 2 (two) times daily as needed (fungus).      clobetasol cream (TEMOVATE) 4.25 % Apply 1 application topically as directed. Qd to bid up to 5 days a week to aa psoriasis on body until clear, then prn flares, avoid face, groin, axilla (Patient taking differently: Apply 1 application topically 2 (two) times daily.) 60 g 2   diclofenac sodium (VOLTAREN) 1 % GEL Apply 1 application topically 3 (three) times daily. (Patient taking differently: Apply 1 application topically 3 (three) times daily as needed (pain).) 1 Tube 0   levothyroxine (SYNTHROID) 75 MCG tablet Take 1 tablet (75 mcg total) by mouth daily. 90 tablet 3   losartan (COZAAR) 100 MG tablet TAKE 1 TABLET BY MOUTH EVERY DAY 90 tablet 3   Magnesium 400 MG TABS Take 400 mg by mouth daily.     Multiple Vitamin (MULTIVITAMIN) tablet Take 1 tablet by mouth daily.     NITROSTAT 0.4 MG SL tablet Place 0.4 mg under the tongue every 5 (five) minutes as needed for chest pain.      SELENIUM PO Take by mouth daily.     silver sulfADIAZINE (SILVADENE) 1 % cream Apply 1 application topically daily. (Patient taking differently: Apply 1 application topically 2 (two) times daily.) 50 g 2   tacrolimus (PROTOPIC) 0.1 % ointment APPLY TOPICALLY TO AFFECTED AREA(S) TWICE DAILY 30 g 0   Travoprost, BAK Free, (TRAVATAN) 0.004 % SOLN ophthalmic solution Place 1 drop into  both eyes at bedtime.     vitamin C (ASCORBIC ACID) 500 MG tablet Take 500 mg by mouth daily.     vitamin E 180 MG (400 UNITS) capsule Take 400 Units by mouth daily.     No facility-administered medications prior to visit.     Per HPI unless specifically indicated in ROS section below Review of Systems  Objective:  BP 126/64   Pulse 90   Temp 98.1 F (36.7 C) (Temporal)   Ht 5\' 3"  (1.6 m)   Wt 171 lb 3 oz (77.7 kg)   SpO2 97%   BMI 30.32 kg/m   Wt Readings from Last 3 Encounters:  10/06/21 171 lb 3 oz (77.7 kg)  09/04/21 171 lb (77.6 kg)  08/20/21 170 lb (77.1 kg)      Physical Exam Vitals and nursing note reviewed.   Constitutional:      Appearance: Normal appearance. She is not ill-appearing.  HENT:     Head: Normocephalic and atraumatic.     Right Ear: Tympanic membrane, ear canal and external ear normal. There is no impacted cerumen.     Left Ear: Tympanic membrane, ear canal and external ear normal. There is no impacted cerumen.  Eyes:     Extraocular Movements: Extraocular movements intact.     Pupils: Pupils are equal, round, and reactive to light.  Cardiovascular:     Rate and Rhythm: Normal rate and regular rhythm.     Pulses: Normal pulses.     Heart sounds: Normal heart sounds. No murmur heard. Pulmonary:     Effort: Pulmonary effort is normal. No respiratory distress.     Breath sounds: No wheezing, rhonchi or rales.     Comments: LLL crackles Musculoskeletal:     Right lower leg: No edema.     Left lower leg: No edema.  Skin:    General: Skin is warm and dry.     Findings: No rash.  Neurological:     Mental Status: She is alert.  Psychiatric:        Mood and Affect: Mood normal.        Behavior: Behavior normal.       Assessment & Plan:  This visit occurred during the SARS-CoV-2 public health emergency.  Safety protocols were in place, including screening questions prior to the visit, additional usage of staff PPE, and extensive cleaning of exam room while observing appropriate contact time as indicated for disinfecting solutions.   Problem List Items Addressed This Visit     Osteoarthritis    Multiple joints, to discuss with surgeon about possible knee replacement.       Malignant neoplasm of overlapping sites of left breast in female, estrogen receptor positive ([place])    S/p L mastectomy 05/2021, has recovered remarkably well. Did not need post-mastectomy XRT. Planning close onc f/u.       Relevant Medications   azithromycin (ZITHROMAX) 250 MG tablet   Acute respiratory infection - Primary    Anticipate acute bronchitis however with LLL crackles, age and recent  surgery, will cover for bacterial infection with azithromycin course, discussed further OTC and supportive care measures at home. Update if not improving with treatment. Pt agrees with plan.       Relevant Medications   azithromycin (ZITHROMAX) 250 MG tablet     Meds ordered this encounter  Medications   azithromycin (ZITHROMAX) 250 MG tablet    Sig: Take two tablets on day one followed by one tablet on days  2-5    Dispense:  6 each    Refill:  0   No orders of the defined types were placed in this encounter.    Patient Instructions  Creo que tiene bronquitis.  Tome robitussin (guaifenesina y dextromethorpan) -tome esto por la noche.  Para expectorar puede tomar mucinex sola (solo guaifenesina).  [place] y descanso para Ad[employer].  Tome zpack antibiotico.   Follow up plan: Return if symptoms worsen or fail to improve.  Ria Bush, MD

## 2021-10-06 NOTE — Patient Instructions (Addendum)
Creo que tiene bronquitis.  Tome robitussin (guaifenesina y dextromethorpan) -tome esto por la noche.  Para expectorar puede tomar mucinex sola (solo guaifenesina).  [place] y descanso para Advertising copywriter.  Tome zpack antibiotico.

## 2021-10-06 NOTE — Telephone Encounter (Addendum)
Spoke with pt asking for updated on sxs.  States she still has prod cough with yellow mucous and nasal congestion.  Denies any other sxs. Pt was not seen at [facility].   Per Dr. Darnell Level, pt scheduled for OV today at 2:00.

## 2021-10-06 NOTE — Assessment & Plan Note (Signed)
Multiple joints, to discuss with surgeon about possible knee replacement.

## 2021-10-06 NOTE — Assessment & Plan Note (Signed)
Anticipate acute bronchitis however with LLL crackles, age and recent surgery, will cover for bacterial infection with azithromycin course, discussed further OTC and supportive care measures at home. Update if not improving with treatment. Pt agrees with plan.

## 2021-10-07 ENCOUNTER — Ambulatory Visit: Payer: Medicare Other

## 2021-10-07 DIAGNOSIS — M79672 Pain in left foot: Secondary | ICD-10-CM | POA: Diagnosis not present

## 2021-10-07 DIAGNOSIS — M542 Cervicalgia: Secondary | ICD-10-CM | POA: Diagnosis not present

## 2021-10-07 DIAGNOSIS — R262 Difficulty in walking, not elsewhere classified: Secondary | ICD-10-CM

## 2021-10-07 NOTE — Therapy (Signed)
[facility] [address], [place], [address] Phone: [phone]   Fax:  [phone]  Physical Therapy Treatment  Patient Details  Name: Beth Rodgers MRN: [id number] Date of Birth: Dec 06, 1935 Referring Provider (PT): Owens Loffler, MD   Encounter Date: 10/07/2021   PT End of Session - 10/07/21 1552     Visit Number 9    Number of Visits 17    Date for PT Re-Evaluation 10/30/21    Authorization Type 9    Authorization Time Period 10    PT Start Time 1552    PT Stop Time 7026    PT Time Calculation (min) 39 min    Activity Tolerance Patient tolerated treatment well    Behavior During Therapy [facility] for tasks assessed/performed             Past Medical History:  Diagnosis Date   Basal cell carcinoma ([place])    txted with MOHs in past   Cellulitis 08/10/2018   Chronic venous insufficiency    with varicose veins   Family history of breast cancer    Family history of thyroid cancer    GERD (gastroesophageal reflux disease)    Glaucoma    History  of basal cell carcinoma    left nare/BREAST CANCER   History of chicken pox    Hypertension    Hypoglycemia    Hypothyroid    Osteoarthritis    back pain, L knee pain   Osteopenia 06/2009   DEXA 11/2015: T -2.3 hip, -2.2 spine, on longterm bisphosphonate/evista   Psoriasis    Pyogenic granuloma 04/16/2016    Past Surgical History:  Procedure Laterality Date   BASAL CELL CARCINOMA EXCISION     located on face   BREAST BIOPSY Left    core done ing [place]?   BREAST BIOPSY Left 08/16/2020   3 area bx 4:00 X, IMC, 6:00Q IMC, LN hydro #3-benign   BREAST LUMPECTOMY Left 09/11/2020   two areas of [facility]   CATARACT EXTRACTION     bilateral   DEXA  06/2009   T score Spine: -1.8, hip -2.0   EXCISION OF BREAST BIOPSY Left 09/11/2020   Procedure: EXCISION OF BREAST BIOPSY;  Surgeon: [name], MD;  Location: [facility] ORS;  Service: General;   Laterality: Left;   Foam sclerotherapy Bilateral 09/2015   [place], [place]   MASTECTOMY W/ SENTINEL NODE BIOPSY Left 06/25/2021   Procedure: MASTECTOMY WITH SENTINEL LYMPH NODE BIOPSY;  Surgeon: [name], MD;  Location: [facility] ORS;  Service: General;  Laterality: Left;   NASAL RECONSTRUCTION  2005   for [place] L nare s/p Mohs   nuclear stress test  2014   normal in [place] BX Left 09/11/2020   Procedure: PARTIAL MASTECTOMY WITH NEEDLE LOCALIZATION AND AXILLARY SENTINEL LYMPH NODE [place];  Surgeon: [name], MD;  Location: [facility] ORS;  Service: General;  Laterality: Left;   RADIOFREQUENCY ABLATION Left 01/2012   remnant L G saphenous vein below knee   RADIOFREQUENCY ABLATION Right 02/2013   RLE vein ablation    VEIN LIGATION AND STRIPPING  remote   bilateral G saphenous veins    There were no vitals filed for this visit.   Subjective Assessment - 10/07/21 1554     Subjective L heel feels better with her shoes. Still feels it when she gets up, goes to the bathroom. 3/10 currently when  walking, 5/10 at most for the past 7 days. 2.5/10 neck pain currently, chin tucks help the most. 5/10 neck pain at most for the past 7 days.    Pertinent History Neck and achilles pain. L knee also bothers her. Had injection L knee which helped 10 days before her breast surgery June 25, 2021. Dr. [name] also states Pt needs surgery for her L knee (TKA). Pt however has a heel spur on L foot which is chronic. Went to 3 specialists for heel spur and was told that there is nothing can be done surgically. The heel pain however affects her gait which affects her L knee increasing L knee pain. Pt continues to do her HEP from previous PT. Neck pain began about 6 months ago gradually. Started feeling tightness from R medial shoulder blade to neck. Pt is R hand dominant. L foot bothers her more currently.    Patient Stated  Goals Learn how to mitigate L heel pain.    Currently in Pain? Yes    Pain Score 3     Pain Onset More than a month ago                                        PT Education - 10/07/21 1629     Education Details ther-ex    Person(s) Educated Patient    Methods Explanation;Demonstration;Tactile cues;Verbal cues    Comprehension Returned demonstration;Verbalized understanding           Objective     No latex band allergies   B varicose veins legs Osteopenia     L heel pain Hx of L mastectomy on June 25, 2021 and had home health PT for recovery.      Started using a SPC 5 months ago when ambulating by herself. Furniture walk at home.    Med[employer] Access Code [email or web address]     Manual therapy  Seated STM/cross friction technique L heel/Achilles area to promote blood flow as well as decrease fascial restrictions.    Seated STM L plantar foot to decrease fascial restrictions     Therapeutic exercise   Seated manually resisted L ankle PF isometrics 40% effort, PT resistance 1 min x 5    Seated manually resisted L ankle PF eccentric 10x3 with PT  Sitting on elevated mat table, heel on furniture slider             Hip abduction 10x3   L knee discomfort when trying to perform S/L hip abduction. Exercise therefore not performed.   Seated L ankle PF 10x3                          Improved exercise technique, movement at target joints, use of target muscles after mod verbal, visual, tactile cues.        Response to treatment Pt tolerated session well without aggravation of symptoms. Pt states her L heel feels better after session.        Clinical Impression Consistently starting with 3/10 L heel pain now for the past 3 sessions. Pt making good progress with decreased heel pain based on subjective reports. Continued working on decreasing soft tissue restrictions, improving blood flow as well as applying appropriate stress to the affected areas  to promote healing. Also worked on [employer] to promote better mechanics throughout her LE to decrease excessive stress  to her Achilles and heel. Pt will benefit from continued skilled physical therapy services to decrease pain, improve strength and function.           PT Short Term Goals - 09/04/21 1822       PT SHORT TERM GOAL #1   Title Patient will be independent with her initial HEP to improve strength, decrease neck and L heel pain, improve function.    Time 3    Period Weeks    Status New    Target Date 09/25/21               PT Long Term Goals - 09/04/21 1823       PT LONG TERM GOAL #1   Title Pt will have a decrease in L heel pain to 4/10 or less at worst to promote ability to ambulate, perform standing tasks more comfortably.    Baseline 8/10 L heel/Achilles pain at worst for the past 3 months (09/04/2021)    Time 8    Period Weeks    Status New    Target Date 10/30/21      PT LONG TERM GOAL #2   Title Pt will improve bilateral hip extension and abduction strength by at least 1/2 MMT grade to promote femoral control and ability to ambulate and perform closed chain tasks with less L heel/Achilles pain.    Baseline Hip extension 4-/5 R, 3+/5 L, hip abduction 3-/5 R, 4-/5 L (09/04/2021)    Time 8    Period Weeks    Status New    Target Date 10/30/21      PT LONG TERM GOAL #3   Title Pt will improve her L foot FOTO score by at least 10 points as a demonstration of improved function.    Baseline L foot FOTO 38 (09/04/2021)    Time 8    Period Weeks    Status New    Target Date 10/30/21      PT LONG TERM GOAL #4   Title Pt will have a decrease in neck pain to 2/10 or less at worst to promote ability to look around more comfortably.    Baseline 5/10 neck pain at worst for the past 3 months (09/04/2021)    Time 8    Period Weeks    Status New    Target Date 10/30/21                   Plan - 10/07/21 1628     Clinical Impression Statement  Consistently starting with 3/10 L heel pain now for the past 3 sessions. Pt making good progress with decreased heel pain based on subjective reports. Continued working on decreasing soft tissue restrictions, improving blood flow as well as applying appropriate stress to the affected areas to promote healing. Also worked on [employer] to promote better mechanics throughout her LE to decrease excessive stress to her Achilles and heel. Pt will benefit from continued skilled physical therapy services to decrease pain, improve strength and function.    Personal Factors and Comorbidities Age;Comorbidity 3+;Time since onset of injury/illness/exacerbation;Fitness    Comorbidities Osteopenia, chronic venous insufficiency,Hx of basal cell CA S/P lumpectomy, HTN    Examination-Activity Limitations Stand;Locomotion Level;Squat;Stairs;Carry;Lift    Stability/Clinical Decision Making Stable/Uncomplicated    Rehab Potential Fair    Clinical Impairments Affecting Rehab Potential Chronicity of condition, age, fitness level    PT Frequency 2x / week    PT Duration 8 weeks  PT Treatment/Interventions Aquatic Therapy;Electrical Stimulation;Iontophoresis 4mg /ml Dexamethasone;Gait training;Stair training;Functional mobility training;Therapeutic activities;Therapeutic exercise;Balance training;Neuromuscular re-education;Patient/family education;Manual techniques;Dry needling   electrical stimulation related treatment and ultrasound if appropriate   PT Next Visit Plan isometric plantar flexion, glute, trunk, scapular strengthening, femoral control, manual techniques, modalities PRN    Consulted and Agree with Plan of Care Patient             Patient will benefit from skilled therapeutic intervention in order to improve the following deficits and impairments:  Postural dysfunction, Decreased strength, Improper body mechanics, Pain, Difficulty walking  Visit Diagnosis: Pain in left foot  Difficulty in  walking, not elsewhere classified     Problem List Patient Active Problem List   Diagnosis Date Noted   Acute respiratory infection 10/06/2021   Pre-op evaluation 06/02/2021   Right wrist pain 02/28/2021   Skin rash 02/28/2021   Ascending aorta dilatation ([place]) 01/10/2021   Left-sided chest pain 01/10/2021   Genetic testing 10/30/2020   Malignant neoplasm of overlapping sites of left breast in female, estrogen receptor positive ([place]) 10/22/2020   Family history of breast cancer    Family history of thyroid cancer    Insertional Achilles tendinopathy 08/28/2020   Breast cancer, left breast ([place]) 08/01/2020   Right carotid bruit 06/05/2020   Chest pressure 06/03/2020   Left Achilles tendinitis 09/01/2019   Low back pain 08/29/2019   Lumbosacral spondylosis without myelopathy 08/29/2019   Calcaneal spur 08/17/2019   Contusion of knee 10/04/2018   Varicose veins of bilateral lower extremities with pain 03/30/2018   Lymphedema 03/30/2018   Dizziness 02/02/2017   Situational depression 12/18/2016   Vitamin D deficiency 10/11/2016   Fatigue 08/12/2016   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 04/14/2016   Essential hypertension 12/17/2015   Dermatitis of external ear 12/17/2015   [place] of foot 05/16/2015   Advanced care planning/counseling discussion 10/30/2014   Neck pain on right side 06/25/2014   Primary osteoarthritis of left knee 08/17/2013   Oropharyngeal dysphagia 11/15/2012   Stressful life events affecting family and household 03/30/2012   Medicare annual wellness visit, subsequent 03/30/2012   History of basal cell carcinoma    Osteoarthritis    Osteopenia    Hypothyroidism    Glaucoma    Chronic venous insufficiency     Joneen Boers PT, DPT   10/07/2021, 7:00 PM  [facility] [address] [place], [address] Phone: [phone]   Fax:  [phone]  Name: Beth Rodgers MRN: [id number] Date of Birth: [birth year]

## 2021-10-09 ENCOUNTER — Ambulatory Visit: Payer: Medicare Other

## 2021-10-09 DIAGNOSIS — R262 Difficulty in walking, not elsewhere classified: Secondary | ICD-10-CM

## 2021-10-09 DIAGNOSIS — M79672 Pain in left foot: Secondary | ICD-10-CM

## 2021-10-09 DIAGNOSIS — M542 Cervicalgia: Secondary | ICD-10-CM

## 2021-10-09 NOTE — Therapy (Signed)
[place] [facility] [address], [place], [address] Phone: [phone]   Fax:  [phone]  Physical Therapy Treatment And Progress Report (09/04/2021 - 10/09/2021)  Patient Details  Name: Beth Rodgers MRN: [id number] Date of Birth: October 28, 1935 Referring Provider (PT): Owens Loffler, MD   Encounter Date: 10/09/2021   PT End of Session - 10/09/21 1417     Visit Number 10    Number of Visits 17    Date for PT Re-Evaluation 10/30/21    Authorization Type 10    Authorization Time Period 10    PT Start Time 7654    PT Stop Time 6503    PT Time Calculation (min) 40 min    Activity Tolerance Patient tolerated treatment well    Behavior During Therapy [facility] for tasks assessed/performed             Past Medical History:  Diagnosis Date   Basal cell carcinoma ([place])    txted with MOHs in past   Cellulitis 08/10/2018   Chronic venous insufficiency    with varicose veins   Family history of breast cancer    Family history of thyroid cancer    GERD (gastroesophageal reflux disease)    Glaucoma    History  of basal cell carcinoma    left nare/BREAST CANCER   History of chicken pox    Hypertension    Hypoglycemia    Hypothyroid    Osteoarthritis    back pain, L knee pain   Osteopenia 06/2009   DEXA 11/2015: T -2.3 hip, -2.2 spine, on longterm bisphosphonate/evista   Psoriasis    Pyogenic granuloma 04/16/2016    Past Surgical History:  Procedure Laterality Date   BASAL CELL CARCINOMA EXCISION     located on face   BREAST BIOPSY Left    core done ing [place]?   BREAST BIOPSY Left 08/16/2020   3 area bx 4:00 X, IMC, 6:00Q IMC, LN hydro #3-benign   BREAST LUMPECTOMY Left 09/11/2020   two areas of [facility]   CATARACT EXTRACTION     bilateral   DEXA  06/2009   T score Spine: -1.8, hip -2.0   EXCISION OF BREAST BIOPSY Left 09/11/2020   Procedure: EXCISION OF BREAST BIOPSY;  Surgeon: [name],  MD;  Location: [facility] ORS;  Service: General;  Laterality: Left;   Foam sclerotherapy Bilateral 09/2015   Reed [place], [place]   MASTECTOMY W/ SENTINEL NODE BIOPSY Left 06/25/2021   Procedure: MASTECTOMY WITH SENTINEL LYMPH NODE BIOPSY;  Surgeon: [name], MD;  Location: [facility] ORS;  Service: General;  Laterality: Left;   NASAL RECONSTRUCTION  2005   for [place] L nare s/p Mohs   nuclear stress test  2014   normal in [place] BX Left 09/11/2020   Procedure: PARTIAL MASTECTOMY WITH NEEDLE LOCALIZATION AND AXILLARY SENTINEL LYMPH NODE [place];  Surgeon: [name], MD;  Location: [facility] ORS;  Service: General;  Laterality: Left;   RADIOFREQUENCY ABLATION Left 01/2012   remnant L G saphenous vein below knee   RADIOFREQUENCY ABLATION Right 02/2013   RLE vein ablation    VEIN LIGATION AND STRIPPING  remote   bilateral G saphenous veins    There were no vitals filed for this visit.   Subjective Assessment - 10/09/21 1420     Subjective L heel resisted quite a bit of walking before yesterday. L heel is improving. 3/10  currently when walking.    Pertinent History Neck and achilles pain. L knee also bothers her. Had injection L knee which helped 10 days before her breast surgery June 25, 2021. Dr. [name] also states Pt needs surgery for her L knee (TKA). Pt however has a heel spur on L foot which is chronic. Went to 3 specialists for heel spur and was told that there is nothing can be done surgically. The heel pain however affects her gait which affects her L knee increasing L knee pain. Pt continues to do her HEP from previous PT. Neck pain began about 6 months ago gradually. Started feeling tightness from R medial shoulder blade to neck. Pt is R hand dominant. L foot bothers her more currently.    Patient Stated Goals Learn how to mitigate L heel pain.    Currently in Pain? Yes    Pain Score 3     Pain Onset  More than a month ago                                        PT Education - 10/09/21 1628     Education Details ther-ex    Person(s) Educated Patient    Methods Explanation;Demonstration;Tactile cues;Verbal cues    Comprehension Returned demonstration;Verbalized understanding            Objective     No latex band allergies   B varicose veins legs Osteopenia     L heel pain Hx of L mastectomy on June 25, 2021 and had home health PT for recovery.      Started using a SPC 5 months ago when ambulating by herself. Furniture walk at home.    Medbridge Access Code [email or web address]     Manual therapy  Seated STM/cross friction technique L heel/Achilles area to promote blood flow as well as decrease fascial restrictions.    Seated STM L plantar foot to decrease fascial restrictions     Therapeutic exercise   Seated manually resisted hip extension, S/L hip abduction 1-2x each way for each LE  Reviewed progress/current status with strength with pt.   Seated manually resisted L ankle PF isometrics 40% effort, PT resistance 1 min x 5    Seated manually resisted L ankle PF eccentric 10x3 with PT   Sitting on elevated mat table, heel on furniture slider             Hip abduction 10x3    Improved exercise technique, movement at target joints, use of target muscles after mod verbal, visual, tactile cues.        Response to treatment Fair tolerance to today's session     Clinical Impression Pt demonstrates overall decreased L heel pain with worst pain decreasing to 5/10. Pt also demonstrates improved hip extension and abduction strength since initial evaluation. Neck pain the same so far secondary to emphasis on improving heel comfort level for decreasing fall risk when ambulating. Pt making some progress with PT towards goals. Pt will benefit from continued skilled physical therapy services to decrease pain, improve strength and function.            PT Short Term Goals - 10/09/21 1422       PT SHORT TERM GOAL #1   Title Patient will be independent with her initial HEP to improve strength, decrease neck and L heel pain, improve function.    Baseline  Does her HEP, no questions (10/09/2021)    Time 3    Period Weeks    Status Achieved    Target Date 09/25/21               PT Long Term Goals - 10/09/21 1422       PT LONG TERM GOAL #1   Title Pt will have a decrease in L heel pain to 4/10 or less at worst to promote ability to ambulate, perform standing tasks more comfortably.    Baseline 8/10 L heel/Achilles pain at worst for the past 3 months (09/04/2021);  5/10 at worst (10/07/2021)    Time 8    Period Weeks    Status Partially Met    Target Date 10/30/21      PT LONG TERM GOAL #2   Title Pt will improve bilateral hip extension and abduction strength by at least 1/2 MMT grade to promote femoral control and ability to ambulate and perform closed chain tasks with less L heel/Achilles pain.    Baseline Hip extension 4-/5 R, 3+/5 L, hip abduction 3-/5 R, 4-/5 L (09/04/2021); 4+/5 R, 4/5 L, hip abduction 4-/5 R, 4/5 L (L knee pain) 10/09/2021    Time 8    Period Weeks    Status Partially Met    Target Date 10/30/21      PT LONG TERM GOAL #3   Title Pt will improve her L foot FOTO score by at least 10 points as a demonstration of improved function.    Baseline L foot FOTO 38 (09/04/2021)    Time 8    Period Weeks    Status New    Target Date 10/30/21      PT LONG TERM GOAL #4   Title Pt will have a decrease in neck pain to 2/10 or less at worst to promote ability to look around more comfortably.    Baseline 5/10 neck pain at worst for the past 3 months (09/04/2021), (10/07/2021)    Time 8    Period Weeks    Status On-going    Target Date 10/30/21                   Plan - 10/09/21 1416     Clinical Impression Statement Pt demonstrates overall decreased L heel pain with worst pain decreasing to  5/10. Pt also demonstrates improved hip extension and abduction strength since initial evaluation. Neck pain the same so far secondary to emphasis on improving heel comfort level for decreasing fall risk when ambulating. Pt making some progress with PT towards goals. Pt will benefit from continued skilled physical therapy services to decrease pain, improve strength and function.    Personal Factors and Comorbidities Age;Comorbidity 3+;Time since onset of injury/illness/exacerbation;Fitness    Comorbidities Osteopenia, chronic venous insufficiency,Hx of basal cell CA S/P lumpectomy, HTN    Examination-Activity Limitations Stand;Locomotion Level;Squat;Stairs;Carry;Lift    Stability/Clinical Decision Making Stable/Uncomplicated    Clinical Decision Making Low    Rehab Potential Fair    Clinical Impairments Affecting Rehab Potential Chronicity of condition, age, fitness level    PT Frequency 2x / week    PT Duration 8 weeks    PT Treatment/Interventions Aquatic Therapy;Electrical Stimulation;Iontophoresis 4mg /ml Dexamethasone;Gait training;Stair training;Functional mobility training;Therapeutic activities;Therapeutic exercise;Balance training;Neuromuscular re-education;Patient/family education;Manual techniques;Dry needling   electrical stimulation related treatment and ultrasound if appropriate   PT Next Visit Plan isometric plantar flexion, glute, trunk, scapular strengthening, femoral control, manual techniques, modalities PRN    Consulted  and Agree with Plan of Care Patient             Patient will benefit from skilled therapeutic intervention in order to improve the following deficits and impairments:  Postural dysfunction, Decreased strength, Improper body mechanics, Pain, Difficulty walking  Visit Diagnosis: Pain in left foot  Difficulty in walking, not elsewhere classified  Cervicalgia     Problem List Patient Active Problem List   Diagnosis Date Noted   Acute respiratory  infection 10/06/2021   Pre-op evaluation 06/02/2021   Right wrist pain 02/28/2021   Skin rash 02/28/2021   Ascending aorta dilatation ([place]) 01/10/2021   Left-sided chest pain 01/10/2021   Genetic testing 10/30/2020   Malignant neoplasm of overlapping sites of left breast in female, estrogen receptor positive ([place]) 10/22/2020   Family history of breast cancer    Family history of thyroid cancer    Insertional Achilles tendinopathy 08/28/2020   Breast cancer, left breast ([place]) 08/01/2020   Right carotid bruit 06/05/2020   Chest pressure 06/03/2020   Left Achilles tendinitis 09/01/2019   Low back pain 08/29/2019   Lumbosacral spondylosis without myelopathy 08/29/2019   Calcaneal spur 08/17/2019   Contusion of knee 10/04/2018   Varicose veins of bilateral lower extremities with pain 03/30/2018   Lymphedema 03/30/2018   Dizziness 02/02/2017   Situational depression 12/18/2016   Vitamin D deficiency 10/11/2016   Fatigue 08/12/2016   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  Joneen Boers PT, DPT   10/09/2021, 4:31 PM  [facility] [address], [place], [address] Phone: [phone]   Fax:  [phone]  Name: VITTORIA NOREEN MRN: [id number] Date of Birth: 1935-09-21

## 2021-10-14 ENCOUNTER — Ambulatory Visit (INDEPENDENT_AMBULATORY_CARE_PROVIDER_SITE_OTHER): Payer: Medicare Other | Admitting: Psychology

## 2021-10-14 ENCOUNTER — Ambulatory Visit: Payer: Medicare Other

## 2021-10-14 DIAGNOSIS — M542 Cervicalgia: Secondary | ICD-10-CM

## 2021-10-14 DIAGNOSIS — M79672 Pain in left foot: Secondary | ICD-10-CM

## 2021-10-14 DIAGNOSIS — F4323 Adjustment disorder with mixed anxiety and depressed mood: Secondary | ICD-10-CM

## 2021-10-14 DIAGNOSIS — R262 Difficulty in walking, not elsewhere classified: Secondary | ICD-10-CM

## 2021-10-14 NOTE — Therapy (Signed)
[place] [facility] [address], [place], [address] Phone: [phone]   Fax:  [phone]  Physical Therapy Treatment  Patient Details  Name: Beth Rodgers MRN: [id number] Date of Birth: 1935-09-29 Referring Provider (PT): Owens Loffler, MD   Encounter Date: 10/14/2021   PT End of Session - 10/14/21 1107     Visit Number 11    Number of Visits 17    Date for PT Re-Evaluation 10/30/21    Authorization Type 1    Authorization Time Period 10    PT Start Time 1106    PT Stop Time 1145    PT Time Calculation (min) 39 min    Activity Tolerance Patient tolerated treatment well    Behavior During Therapy [facility] for tasks assessed/performed             Past Medical History:  Diagnosis Date   Basal cell carcinoma ([place])    txted with MOHs in past   Cellulitis 08/10/2018   Chronic venous insufficiency    with varicose veins   Family history of breast cancer    Family history of thyroid cancer    GERD (gastroesophageal reflux disease)    Glaucoma    History  of basal cell carcinoma    left nare/BREAST CANCER   History of chicken pox    Hypertension    Hypoglycemia    Hypothyroid    Osteoarthritis    back pain, L knee pain   Osteopenia 06/2009   DEXA 11/2015: T -2.3 hip, -2.2 spine, on longterm bisphosphonate/evista   Psoriasis    Pyogenic granuloma 04/16/2016    Past Surgical History:  Procedure Laterality Date   BASAL CELL CARCINOMA EXCISION     located on face   BREAST BIOPSY Left    core done ing [place]?   BREAST BIOPSY Left 08/16/2020   3 area bx 4:00 X, IMC, 6:00Q IMC, LN hydro #3-benign   BREAST LUMPECTOMY Left 09/11/2020   two areas of [facility]   CATARACT EXTRACTION     bilateral   DEXA  06/2009   T score Spine: -1.8, hip -2.0   EXCISION OF BREAST BIOPSY Left 09/11/2020   Procedure: EXCISION OF BREAST BIOPSY;  Surgeon: [name], MD;  Location: [facility] ORS;  Service: General;   Laterality: Left;   Foam sclerotherapy Bilateral 09/2015   [place], [place]   MASTECTOMY W/ SENTINEL NODE BIOPSY Left 06/25/2021   Procedure: MASTECTOMY WITH SENTINEL LYMPH NODE BIOPSY;  Surgeon: [name], MD;  Location: [facility] ORS;  Service: General;  Laterality: Left;   NASAL RECONSTRUCTION  2005   for [place] L nare s/p Mohs   nuclear stress test  2014   normal in [place] BX Left 09/11/2020   Procedure: PARTIAL MASTECTOMY WITH NEEDLE LOCALIZATION AND AXILLARY SENTINEL LYMPH NODE [place];  Surgeon: [name], MD;  Location: [facility] ORS;  Service: General;  Laterality: Left;   RADIOFREQUENCY ABLATION Left 01/2012   remnant L G saphenous vein below knee   RADIOFREQUENCY ABLATION Right 02/2013   RLE vein ablation    VEIN LIGATION AND STRIPPING  remote   bilateral G saphenous veins    There were no vitals filed for this visit.   Subjective Assessment - 10/14/21 1108     Subjective Might have to get a corisone shot L knee this week. L heel is a little better today. 2.5/10 L heel  pain currently. Answered FOTO questionnaire for her L heel including her knee pain as well (PT note: this may make current FOTO score inacurate)    Pertinent History Neck and achilles pain. L knee also bothers her. Had injection L knee which helped 10 days before her breast surgery June 25, 2021. Dr. [name] also states Pt needs surgery for her L knee (TKA). Pt however has a heel spur on L foot which is chronic. Went to 3 specialists for heel spur and was told that there is nothing can be done surgically. The heel pain however affects her gait which affects her L knee increasing L knee pain. Pt continues to do her HEP from previous PT. Neck pain began about 6 months ago gradually. Started feeling tightness from R medial shoulder blade to neck. Pt is R hand dominant. L foot bothers her more currently.    Patient Stated Goals  Learn how to mitigate L heel pain.    Currently in Pain? Yes    Pain Score 3     Pain Onset More than a month ago                                        PT Education - 10/14/21 1138     Education Details ther-ex    Person(s) Educated Patient    Methods Explanation;Demonstration;Tactile cues;Verbal cues    Comprehension Returned demonstration;Verbalized understanding           Objective     No latex band allergies   B varicose veins legs Osteopenia     L heel pain Hx of L mastectomy on June 25, 2021 and had home health PT for recovery.      Started using a SPC 5 months ago when ambulating by herself. Furniture walk at home.    Medbridge Access Code [email or web address]     Manual therapy  Seated STM/cross friction technique L heel/Achilles area to promote blood flow as well as decrease fascial restrictions.    Seated STM L plantar foot to decrease fascial restrictions     Therapeutic exercise  Seated manually resisted L ankle PF isometrics 40% effort, PT resistance 1 min x 5    Seated manually resisted L ankle PF eccentric 10x3 with PT   R S/L L hip abduction 3x. L knee pain, R hip pain from pressure   Sitting on elevated mat table, heel on furniture slider             Hip abduction 10x3 with PT manual resistance   Sitting on elevated mat table, hips less than 90 degrees flexion, clamshell manually resisted isometrics 10x2 with 5 second holds      Improved exercise technique, movement at target joints, use of target muscles after mod verbal, visual, tactile cues.        Response to treatment Pt tolerated session well without aggravation of L heel pain.      Clinical Impression Decreased starting pain level to 2.5/10 from an average of 3/10. Continued working on improving blood flow, decreasing fascial restrictions, applying proper stress to L heel/tendon to promote healing as well as improving glute strength to promote better LE  mechanics during gait. Decreased L heel pain reported after session. Pt will benefit from continued skilled physical therapy services to decrease pain, improve strength and function.        PT Short Term Goals - 10/09/21  [place] #1   Title Patient will be independent with her initial HEP to improve strength, decrease neck and L heel pain, improve function.    Baseline Does her HEP, no questions (10/09/2021)    Time 3    Period Weeks    Status Achieved    Target Date 09/25/21               PT Long Term Goals - 10/09/21 1422       PT LONG TERM GOAL #1   Title Pt will have a decrease in L heel pain to 4/10 or less at worst to promote ability to ambulate, perform standing tasks more comfortably.    Baseline 8/10 L heel/Achilles pain at worst for the past 3 months (09/04/2021);  5/10 at worst (10/07/2021)    Time 8    Period Weeks    Status Partially Met    Target Date 10/30/21      PT LONG TERM GOAL #2   Title Pt will improve bilateral hip extension and abduction strength by at least 1/2 MMT grade to promote femoral control and ability to ambulate and perform closed chain tasks with less L heel/Achilles pain.    Baseline Hip extension 4-/5 R, 3+/5 L, hip abduction 3-/5 R, 4-/5 L (09/04/2021); 4+/5 R, 4/5 L, hip abduction 4-/5 R, 4/5 L (L knee pain) 10/09/2021    Time 8    Period Weeks    Status Partially Met    Target Date 10/30/21      PT LONG TERM GOAL #3   Title Pt will improve her L foot FOTO score by at least 10 points as a demonstration of improved function.    Baseline L foot FOTO 38 (09/04/2021)    Time 8    Period Weeks    Status New    Target Date 10/30/21      PT LONG TERM GOAL #4   Title Pt will have a decrease in neck pain to 2/10 or less at worst to promote ability to look around more comfortably.    Baseline 5/10 neck pain at worst for the past 3 months (09/04/2021), (10/07/2021)    Time 8    Period Weeks    Status On-going    Target  Date 10/30/21                   Plan - 10/14/21 1254     Clinical Impression Statement Decreased starting pain level to 2.5/10 from an average of 3/10. Continued working on improving blood flow, decreasing fascial restrictions, applying proper stress to L heel/tendon to promote healing as well as improving glute strength to promote better LE mechanics during gait. Decreased L heel pain reported after session. Pt will benefit from continued skilled physical therapy services to decrease pain, improve strength and function.    Personal Factors and Comorbidities Age;Comorbidity 3+;Time since onset of injury/illness/exacerbation;Fitness    Comorbidities Osteopenia, chronic venous insufficiency,Hx of basal cell CA S/P lumpectomy, HTN    Examination-Activity Limitations Stand;Locomotion Level;Squat;Stairs;Carry;Lift    Stability/Clinical Decision Making Stable/Uncomplicated    Clinical Decision Making Low    Rehab Potential Fair    Clinical Impairments Affecting Rehab Potential Chronicity of condition, age, fitness level    PT Frequency 2x / week    PT Duration 8 weeks    PT Treatment/Interventions Aquatic Therapy;Electrical Stimulation;Iontophoresis 40m/ml Dexamethasone;Gait training;Stair training;Functional mobility training;Therapeutic activities;Therapeutic exercise;Balance training;Neuromuscular re-education;Patient/family education;Manual techniques;Dry  needling   electrical stimulation related treatment and ultrasound if appropriate   PT Next Visit Plan isometric plantar flexion, glute, trunk, scapular strengthening, femoral control, manual techniques, modalities PRN    Consulted and Agree with Plan of Care Patient             Patient will benefit from skilled therapeutic intervention in order to improve the following deficits and impairments:  Postural dysfunction, Decreased strength, Improper body mechanics, Pain, Difficulty walking  Visit Diagnosis: Pain in left  foot  Difficulty in walking, not elsewhere classified  Cervicalgia     Problem List Patient Active Problem List   Diagnosis Date Noted   Acute respiratory infection 10/06/2021   Pre-op evaluation 06/02/2021   Right wrist pain 02/28/2021   Skin rash 02/28/2021   Ascending aorta dilatation ([employer]) 01/10/2021   Left-sided chest pain 01/10/2021   Genetic testing 10/30/2020   Malignant neoplasm of overlapping sites of left breast in female, estrogen receptor positive ([place]) 10/22/2020   Family history of breast cancer    Family history of thyroid cancer    Insertional Achilles tendinopathy 08/28/2020   Breast cancer, left breast ([place]) 08/01/2020   Right carotid bruit 06/05/2020   Chest pressure 06/03/2020   Left Achilles tendinitis 09/01/2019   Low back pain 08/29/2019   Lumbosacral spondylosis without myelopathy 08/29/2019   Calcaneal spur 08/17/2019   Contusion of knee 10/04/2018   Varicose veins of bilateral lower extremities with pain 03/30/2018   Lymphedema 03/30/2018   Dizziness 02/02/2017   Situational depression 12/18/2016   Vitamin D deficiency 10/11/2016   Fatigue 08/12/2016   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 04/14/2016   Essential hypertension 12/17/2015   Dermatitis of external ear 12/17/2015   Corn of foot 05/16/2015   Advanced care planning/counseling discussion 10/30/2014   Neck pain on right side 06/25/2014   Primary osteoarthritis of left knee 08/17/2013   Oropharyngeal dysphagia 11/15/2012   Stressful life events affecting family and household 03/30/2012   Medicare annual wellness visit, subsequent 03/30/2012   History of basal cell carcinoma    Osteoarthritis    Osteopenia    Hypothyroidism    Glaucoma    Chronic venous insufficiency    Joneen Boers PT, DPT   10/14/2021, 1:00 PM  [place] [facility] [address], [place], [address] Phone: [phone]    Fax:  [phone]  Name: ANABELL SWINT MRN: [id number] Date of Birth: Jul 11, 1935

## 2021-10-16 ENCOUNTER — Other Ambulatory Visit: Payer: Self-pay

## 2021-10-16 ENCOUNTER — Telehealth: Payer: Self-pay | Admitting: Family Medicine

## 2021-10-16 ENCOUNTER — Ambulatory Visit (INDEPENDENT_AMBULATORY_CARE_PROVIDER_SITE_OTHER): Payer: Medicare Other | Admitting: Family Medicine

## 2021-10-16 ENCOUNTER — Ambulatory Visit: Payer: Medicare Other

## 2021-10-16 ENCOUNTER — Encounter: Payer: Self-pay | Admitting: Family Medicine

## 2021-10-16 VITALS — BP 128/80 | HR 92 | Temp 98.0°F | Ht 63.0 in | Wt 172.2 lb

## 2021-10-16 DIAGNOSIS — M1712 Unilateral primary osteoarthritis, left knee: Secondary | ICD-10-CM | POA: Diagnosis not present

## 2021-10-16 NOTE — Telephone Encounter (Signed)
Patient is seeing Dr. Lorelei Pont today at 11:20 am.

## 2021-10-16 NOTE — Telephone Encounter (Signed)
Pt went to orthopedic provider that her provider referred her to and they did xrays and she wanted to see if Dr Danise Mina can see them and if he can go over with her at todays appointment

## 2021-10-16 NOTE — Progress Notes (Signed)
Gracynn Rajewski T. Aariona Momon, MD, [place] at [facility] [place], [address]  Phone: [phone]  FAX: [phone]  Beth Rodgers - 86 y.o. female  MRN [id number]  Date of Birth: 08-Oct-1935  Date: 10/16/2021  PCP: Ria Bush, MD  Referral: Ria Bush, MD  Chief Complaint  Patient presents with  . Knee Pain    Left-Wants injection if possible today and also to discuss maybe a referral to a different ortho surgeon    This visit occurred during the SARS-CoV-2 public health emergency.  Safety protocols were in place, including screening questions prior to the visit, additional usage of staff PPE, and extensive cleaning of exam room while observing appropriate contact time as indicated for disinfecting solutions.   Subjective:   WILLO YOON is a 86 y.o. very pleasant female patient with Body mass index is 30.51 kg/m. who presents with the following:  Discussion of L knee OA:  This was from Dr. Graciella Freer PA Reviewed Dr. Graciella Freer notes.  Rec TKA, cortisone, vs visco. Discussion with his PA. She felt as if the mostly talked about Visco.  Wants to go to [place].  Wants to go for four or five months.  Wants to consider another surgical consultation.  Interested in having a total knee replacement.  Now with recurrence of OA She has lost a lot of motion with her knee, and now she is using a walker.  She is having pain every day  Review of Systems is noted in the HPI, as appropriate   Objective:   BP 128/80   Pulse 92   Temp 98 F (36.7 C) (Temporal)   Ht 5\' 3"  (1.6 m)   Wt 172 lb 4 oz (78.1 kg)   SpO2 98%   BMI 30.51 kg/m   Walks with an obvious antalgic gait using a walker.  Left knee: Lacks 5 degrees of extension and flexion to 107.  Stable to varus and valgus stress.  Medial and lateral joint lines are notably tender.  ACL, PCL, MCL, and LCL are all intact.  No tenderness at the tibial plateau, fibula,  patellar tendon or quad or patellar tendons.  Any form of forced flexion causes pain.  Radiology: EXAM: LEFT KNEE 3 VIEWS   COMPARISON:  None.   FINDINGS: There is tricompartment osteophyte formation with moderate lateral and severe patellofemoral joint space narrowing. Small joint effusion. No evidence of acute fracture.   IMPRESSION: Tricompartment osteoarthritis of the left knee, with moderate lateral and severe patellofemoral joint space narrowing. Small effusion.     Electronically Signed   By: Maurine Simmering   On: 06/02/2021 16:59  Assessment and Plan:     ICD-10-CM   1. Primary osteoarthritis of left knee  M17.12 Ambulatory referral to Orthopedic Surgery     Total encounter time: 24 minutes. This includes total time spent on the day of encounter.   She is having significant functional impairment of her left knee.  I have injected her knee with some steroids in the past, and she did well but for short time.  Talked about osteoarthritis in general.  Multiple avenues are certainly possible and I do agree with her recent PA visit that viscosupplementation, and additional steroids and total knee arthroplasty are appropriate based on patient's individual needs.  She does have more functional impairment in her exam is more impressive than her plain films.  Nevertheless, she does have very significant arthritis.  I recommended not  getting a steroid injection today in case she ultimately did have a knee replacement in the upcoming months.  She also wanted to have a additional consultation, so I am going to see if Dr. [name] would give his opinion, too.  I appreciate everyone's assistance.  No orders of the defined types were placed in this encounter.  There are no discontinued medications. Orders Placed This Encounter  Procedures  . Ambulatory referral to Orthopedic Surgery    Follow-up: No follow-ups on file.  Dragon [employer] One speech-to-text software was used for  transcription in this dictation.  Possible transcriptional errors can occur using Editor, commissioning.   Signed,  Maud Deed. Kiyona Mcnall, MD   Outpatient Encounter Medications as of 10/16/2021  Medication Sig  . alendronate (FOSAMAX) 70 MG tablet Take 70 mg by mouth every Sunday.  Marland Kitchen azithromycin (ZITHROMAX) 250 MG tablet Take two tablets on day one followed by one tablet on days 2-5  . Calcium Carb-Cholecalciferol (CALCIUM 600 + D PO) Take 1 tablet by mouth daily.  . calcium carbonate (TUMS EX) 750 MG chewable tablet Chew 750 mg by mouth daily as needed for heartburn.  . Cholecalciferol (VITAMIN D) 50 MCG (2000 UT) CAPS Take 1 capsule (2,000 Units total) by mouth daily.  . Ciclopirox 0.77 % gel Apply 1 application topically 2 (two) times daily as needed (fungus).  . clobetasol cream (TEMOVATE) 1.61 % Apply 1 application topically as directed. Qd to bid up to 5 days a week to aa psoriasis on body until clear, then prn flares, avoid face, groin, axilla (Patient taking differently: Apply 1 application topically 2 (two) times daily.)  . diclofenac sodium (VOLTAREN) 1 % GEL Apply 1 application topically 3 (three) times daily. (Patient taking differently: Apply 1 application topically 3 (three) times daily as needed (pain).)  . levothyroxine (SYNTHROID) 75 MCG tablet Take 1 tablet (75 mcg total) by mouth daily.  Marland Kitchen losartan (COZAAR) 100 MG tablet TAKE 1 TABLET BY MOUTH EVERY DAY  . Magnesium 400 MG TABS Take 400 mg by mouth daily.  . Multiple Vitamin (MULTIVITAMIN) tablet Take 1 tablet by mouth daily.  Marland Kitchen NITROSTAT 0.4 MG SL tablet Place 0.4 mg under the tongue every 5 (five) minutes as needed for chest pain.   Marland Kitchen SELENIUM PO Take by mouth daily.  . silver sulfADIAZINE (SILVADENE) 1 % cream Apply 1 application topically daily. (Patient taking differently: Apply 1 application topically 2 (two) times daily.)  . tacrolimus (PROTOPIC) 0.1 % ointment APPLY TOPICALLY TO AFFECTED AREA(S) TWICE DAILY  . Travoprost,  BAK Free, (TRAVATAN) 0.004 % SOLN ophthalmic solution Place 1 drop into both eyes at bedtime.  . vitamin C (ASCORBIC ACID) 500 MG tablet Take 500 mg by mouth daily.  . vitamin E 180 MG (400 UNITS) capsule Take 400 Units by mouth daily.   No facility-administered encounter medications on file as of 10/16/2021.

## 2021-10-20 ENCOUNTER — Telehealth: Payer: Self-pay

## 2021-10-20 NOTE — Telephone Encounter (Signed)
ERROR

## 2021-10-21 ENCOUNTER — Telehealth: Payer: Self-pay | Admitting: Family Medicine

## 2021-10-21 ENCOUNTER — Ambulatory Visit: Payer: Medicare Other

## 2021-10-21 DIAGNOSIS — R262 Difficulty in walking, not elsewhere classified: Secondary | ICD-10-CM

## 2021-10-21 DIAGNOSIS — C50512 Malignant neoplasm of lower-outer quadrant of left female breast: Secondary | ICD-10-CM | POA: Diagnosis not present

## 2021-10-21 DIAGNOSIS — M79672 Pain in left foot: Secondary | ICD-10-CM | POA: Diagnosis not present

## 2021-10-21 DIAGNOSIS — M542 Cervicalgia: Secondary | ICD-10-CM | POA: Diagnosis not present

## 2021-10-21 NOTE — Therapy (Signed)
[facility] [address], [place], [address] Phone: [phone]   Fax:  [phone]  Physical Therapy Treatment  Patient Details  Name: Beth Rodgers MRN: [id number] Date of Birth: [birth year] Referring Provider (PT): Owens Loffler, MD   Encounter Date: 10/21/2021   PT End of Session - 10/21/21 1423     Visit Number 12    Number of Visits 17    Date for PT Re-Evaluation 10/30/21    Authorization Type 2    Authorization Time Period 10    PT Start Time 6962    PT Stop Time 1501    PT Time Calculation (min) 38 min    Activity Tolerance Patient tolerated treatment well    Behavior During Therapy [facility] for tasks assessed/performed             Past Medical History:  Diagnosis Date   Basal cell carcinoma ([place])    txted with MOHs in past   Cellulitis 08/10/2018   Chronic venous insufficiency    with varicose veins   Family history of breast cancer    Family history of thyroid cancer    GERD (gastroesophageal reflux disease)    Glaucoma    History  of basal cell carcinoma    left nare/BREAST CANCER   History of chicken pox    Hypertension    Hypoglycemia    Hypothyroid    Osteoarthritis    back pain, L knee pain   Osteopenia 06/2009   DEXA 11/2015: T -2.3 hip, -2.2 spine, on longterm bisphosphonate/evista   Psoriasis    Pyogenic granuloma 04/16/2016    Past Surgical History:  Procedure Laterality Date   BASAL CELL CARCINOMA EXCISION     located on face   BREAST BIOPSY Left    core done ing [place]?   BREAST BIOPSY Left 08/16/2020   3 area bx 4:00 X, IMC, 6:00Q IMC, LN hydro #3-benign   BREAST LUMPECTOMY Left 09/11/2020   two areas of [facility]   CATARACT EXTRACTION     bilateral   DEXA  06/2009   T score Spine: -1.8, hip -2.0   EXCISION OF BREAST BIOPSY Left 09/11/2020   Procedure: EXCISION OF BREAST BIOPSY;  Surgeon: [name], MD;  Location: [facility] ORS;  Service: General;   Laterality: Left;   Foam sclerotherapy Bilateral 09/2015   [place], [place]   MASTECTOMY W/ SENTINEL NODE BIOPSY Left 06/25/2021   Procedure: MASTECTOMY WITH SENTINEL LYMPH NODE BIOPSY;  Surgeon: [name], MD;  Location: [facility] ORS;  Service: General;  Laterality: Left;   NASAL RECONSTRUCTION  2005   for [place] L nare s/p Mohs   nuclear stress test  2014   normal in [place] BX Left 09/11/2020   Procedure: PARTIAL MASTECTOMY WITH NEEDLE LOCALIZATION AND AXILLARY SENTINEL LYMPH NODE [place];  Surgeon: [name], MD;  Location: [facility] ORS;  Service: General;  Laterality: Left;   RADIOFREQUENCY ABLATION Left 01/2012   remnant L G saphenous vein below knee   RADIOFREQUENCY ABLATION Right 02/2013   RLE vein ablation    VEIN LIGATION AND STRIPPING  remote   bilateral G saphenous veins    There were no vitals filed for this visit.   Subjective Assessment - 10/21/21 1423     Subjective Was not conscious of the L heel pain. Has been such a busy day. L heel has not hurt as much  as other times.    Pertinent History Neck and achilles pain. L knee also bothers her. Had injection L knee which helped 10 days before her breast surgery June 25, 2021. Dr. [name] also states Pt needs surgery for her L knee (TKA). Pt however has a heel spur on L foot which is chronic. Went to 3 specialists for heel spur and was told that there is nothing can be done surgically. The heel pain however affects her gait which affects her L knee increasing L knee pain. Pt continues to do her HEP from previous PT. Neck pain began about 6 months ago gradually. Started feeling tightness from R medial shoulder blade to neck. Pt is R hand dominant. L foot bothers her more currently.    Patient Stated Goals Learn how to mitigate L heel pain.    Currently in Pain? Other (Comment)   no pain level report.   Pain Onset More than a month ago                                         PT Education - 10/21/21 1557     Education Details ther-ex    Person(s) Educated Patient    Methods Explanation;Demonstration;Tactile cues;Verbal cues    Comprehension Returned demonstration;Verbalized understanding            Objective     No latex band allergies   B varicose veins legs Osteopenia     L heel pain Hx of L mastectomy on June 25, 2021 and had home health PT for recovery.      Started using a SPC 5 months ago when ambulating by herself. Furniture walk at home.    Medbridge Access Code [email or web address]     Manual therapy  Seated STM/cross friction technique L heel/Achilles area to promote blood flow as well as decrease fascial restrictions.    Seated STM L plantar foot to decrease fascial restrictions   Seated STM L lateral hamstrings to decrease tension.   Decreased L knee pain with gait afterwards.      Therapeutic exercise   Seated manually resisted L ankle PF isometrics 40% effort, PT resistance 1 min x 5    Seated manually resisted L ankle PF eccentric 10x3 with PT   Sitting on elevated mat table, hips less than 90 degrees flexion, clamshell yellow band with towel padding due to veins. Heel on furniture slider L 10x, then 10x2 with 5 second holds   Added yellow band to HEP. Pt demonstrated and verbalized understanding.      Improved exercise technique, movement at target joints, use of target muscles after mod verbal, visual, tactile cues.        Response to treatment Pt tolerated session well without aggravation of L heel pain. Decreased L knee pain with gait as well after session.      Clinical Impression Improving overall starting L heel pain level with more pin point tenderness to L medial heel instead of diffuse tenderness. Continued with promote blood flow, decreasing soft tissue restrictions and applying proper stress to affected areas to promote healing. Also worked on  decreasing lateral hamstrings muscle tension to promote better mechanics to her L knee. Decreased L knee pain during gait after session. Pt will benefit from continued skilled physical therapy services to decrease pain, improve strength and function.         PT Short Term  Goals - 10/09/21 1422       PT SHORT TERM GOAL #1   Title Patient will be independent with her initial HEP to improve strength, decrease neck and L heel pain, improve function.    Baseline Does her HEP, no questions (10/09/2021)    Time 3    Period Weeks    Status Achieved    Target Date 09/25/21               PT Long Term Goals - 10/09/21 1422       PT LONG TERM GOAL #1   Title Pt will have a decrease in L heel pain to 4/10 or less at worst to promote ability to ambulate, perform standing tasks more comfortably.    Baseline 8/10 L heel/Achilles pain at worst for the past 3 months (09/04/2021);  5/10 at worst (10/07/2021)    Time 8    Period Weeks    Status Partially Met    Target Date 10/30/21      PT LONG TERM GOAL #2   Title Pt will improve bilateral hip extension and abduction strength by at least 1/2 MMT grade to promote femoral control and ability to ambulate and perform closed chain tasks with less L heel/Achilles pain.    Baseline Hip extension 4-/5 R, 3+/5 L, hip abduction 3-/5 R, 4-/5 L (09/04/2021); 4+/5 R, 4/5 L, hip abduction 4-/5 R, 4/5 L (L knee pain) 10/09/2021    Time 8    Period Weeks    Status Partially Met    Target Date 10/30/21      PT LONG TERM GOAL #3   Title Pt will improve her L foot FOTO score by at least 10 points as a demonstration of improved function.    Baseline L foot FOTO 38 (09/04/2021)    Time 8    Period Weeks    Status New    Target Date 10/30/21      PT LONG TERM GOAL #4   Title Pt will have a decrease in neck pain to 2/10 or less at worst to promote ability to look around more comfortably.    Baseline 5/10 neck pain at worst for the past 3 months (09/04/2021),  (10/07/2021)    Time 8    Period Weeks    Status On-going    Target Date 10/30/21                   Plan - 10/21/21 1422     Personal Factors and Comorbidities Age;Comorbidity 3+;Time since onset of injury/illness/exacerbation;Fitness    Comorbidities Osteopenia, chronic venous insufficiency,Hx of basal cell CA S/P lumpectomy, HTN    Examination-Activity Limitations Stand;Locomotion Level;Squat;Stairs;Carry;Lift    Stability/Clinical Decision Making Stable/Uncomplicated    Rehab Potential Fair    Clinical Impairments Affecting Rehab Potential Chronicity of condition, age, fitness level    PT Frequency 2x / week    PT Duration 8 weeks    PT Treatment/Interventions Aquatic Therapy;Electrical Stimulation;Iontophoresis 85m/ml Dexamethasone;Gait training;Stair training;Functional mobility training;Therapeutic activities;Therapeutic exercise;Balance training;Neuromuscular re-education;Patient/family education;Manual techniques;Dry needling   electrical stimulation related treatment and ultrasound if appropriate   PT Next Visit Plan isometric plantar flexion, glute, trunk, scapular strengthening, femoral control, manual techniques, modalities PRN    Consulted and Agree with Plan of Care Patient             Patient will benefit from skilled therapeutic intervention in order to improve the following deficits and impairments:  Postural dysfunction, Decreased strength, Improper  body mechanics, Pain, Difficulty walking  Visit Diagnosis: Difficulty in walking, not elsewhere classified  Pain in left foot     Problem List Patient Active Problem List   Diagnosis Date Noted   Acute respiratory infection 10/06/2021   Pre-op evaluation 06/02/2021   Right wrist pain 02/28/2021   Skin rash 02/28/2021   Ascending aorta dilatation ([place]) 01/10/2021   Left-sided chest pain 01/10/2021   Genetic testing 10/30/2020   Malignant neoplasm of overlapping sites of left breast in female,  estrogen receptor positive ([place]) 10/22/2020   Family history of breast cancer    Family history of thyroid cancer    Insertional Achilles tendinopathy 08/28/2020   Breast cancer, left breast ([place]) 08/01/2020   Right carotid bruit 06/05/2020   Chest pressure 06/03/2020   Left Achilles tendinitis 09/01/2019   Low back pain 08/29/2019   Lumbosacral spondylosis without myelopathy 08/29/2019   Calcaneal spur 08/17/2019   Contusion of knee 10/04/2018   Varicose veins of bilateral lower extremities with pain 03/30/2018   Lymphedema 03/30/2018   Dizziness 02/02/2017   Situational depression 12/18/2016   Vitamin D deficiency 10/11/2016   Fatigue 08/12/2016   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 04/14/2016   Essential hypertension 12/17/2015   Dermatitis of external ear 12/17/2015   Corn of foot 05/16/2015   Advanced care planning/counseling discussion 10/30/2014   Neck pain on right side 06/25/2014   Primary osteoarthritis of left knee 08/17/2013   Oropharyngeal dysphagia 11/15/2012   Stressful life events affecting family and household 03/30/2012   Medicare annual wellness visit, subsequent 03/30/2012   History of basal cell carcinoma    Osteoarthritis    Osteopenia    Hypothyroidism    Glaucoma    Chronic venous insufficiency     Joneen Boers PT, DPT   10/21/2021, 3:59 PM  [facility] [address] [place], [address] Phone: [phone]   Fax:  [phone]  Name: DORISMAR CHAY MRN: [id number] Date of Birth: [birth year]

## 2021-10-21 NOTE — Patient Instructions (Signed)
  Added yellow band resistance to her seated hip abduction exercise on bed with L foot on furniture slider HEP.

## 2021-10-21 NOTE — Telephone Encounter (Signed)
Pt called stating that she need a referral with Dr Skip Estimable at [facility] [place] [phone].Pt was seen by Dr Lorelei Pont on 10/16/21. Pt would like a call at [phone].

## 2021-10-21 NOTE — Telephone Encounter (Signed)
Referral has already been placed and routed to the the office. This was done today 10/21/21 at 3:25 PM

## 2021-10-22 ENCOUNTER — Telehealth: Payer: Self-pay | Admitting: Family Medicine

## 2021-10-22 ENCOUNTER — Other Ambulatory Visit: Payer: Self-pay | Admitting: Family Medicine

## 2021-10-22 MED ORDER — CEFDINIR 300 MG PO CAPS: 300.0000 mg | ORAL_CAPSULE | Freq: Two times a day (BID) | ORAL | 0 refills | Status: DC

## 2021-10-22 MED ORDER — AMOXICILLIN-POT CLAVULANATE 875-125 MG PO TABS: 1.0000 | ORAL_TABLET | Freq: Two times a day (BID) | ORAL | 0 refills | Status: DC

## 2021-10-22 NOTE — Addendum Note (Signed)
Addended by: Ria Bush on: 10/22/2021 03:33 PM   Modules accepted: Orders

## 2021-10-22 NOTE — Telephone Encounter (Signed)
See note from pharmacy.

## 2021-10-22 NOTE — Telephone Encounter (Signed)
Augmentin on back order.  Will Rx omnicef 300mg  BID 7d course.

## 2021-10-22 NOTE — Telephone Encounter (Signed)
Patient notified rx was sent 

## 2021-10-22 NOTE — Telephone Encounter (Signed)
Let's do augmentin 10d course sent to pharmacy.

## 2021-10-22 NOTE — Telephone Encounter (Signed)
Pt called stating that the medication azithromycin (ZITHROMAX) 250 MG tablet,is not making her feel any better.Please advise.

## 2021-10-22 NOTE — Telephone Encounter (Signed)
Spoke to patient by telephone and was advised that she took the Zithromax as prescribed. Patient stated that she never felt like she ever got over what she had. Patient stated that she has a productive cough/yellow. Patient denies a fever or SOB. Patient stated that she is taking Robitussin as needed. Patient stated that sometimes after having a bad coughing spell she has a little wheezing but it is not bad. Patient stated that she has an upcoming appointment scheduled with Dr. Danise Mina Monday 10/27/21 but wants to know if she needs to start another antibiotic prior to her visit. Patient was given ER precautions and she verbalized understanding. Pharmacy [facility].

## 2021-10-23 ENCOUNTER — Ambulatory Visit: Payer: Medicare Other

## 2021-10-23 DIAGNOSIS — R262 Difficulty in walking, not elsewhere classified: Secondary | ICD-10-CM

## 2021-10-23 DIAGNOSIS — M542 Cervicalgia: Secondary | ICD-10-CM | POA: Diagnosis not present

## 2021-10-23 DIAGNOSIS — M79672 Pain in left foot: Secondary | ICD-10-CM | POA: Diagnosis not present

## 2021-10-23 NOTE — Therapy (Signed)
[facility] [address], [place], [address] Phone: [phone]   Fax:  [phone]  Physical Therapy Treatment  Patient Details  Name: Beth Rodgers MRN: [id number] Date of Birth: 04/20/1935 Referring Provider (PT): Owens Loffler, MD   Encounter Date: 10/23/2021   PT End of Session - 10/23/21 1416     Visit Number 13    Number of Visits 17    Date for PT Re-Evaluation 10/30/21    Authorization Type 3    Authorization Time Period 10    PT Start Time 1416    PT Stop Time 1459    PT Time Calculation (min) 43 min    Activity Tolerance Patient tolerated treatment well    Behavior During Therapy [facility] for tasks assessed/performed             Past Medical History:  Diagnosis Date   Basal cell carcinoma ([place])    txted with MOHs in past   Cellulitis 08/10/2018   Chronic venous insufficiency    with varicose veins   Family history of breast cancer    Family history of thyroid cancer    GERD (gastroesophageal reflux disease)    Glaucoma    History  of basal cell carcinoma    left nare/BREAST CANCER   History of chicken pox    Hypertension    Hypoglycemia    Hypothyroid    Osteoarthritis    back pain, L knee pain   Osteopenia 06/2009   DEXA 11/2015: T -2.3 hip, -2.2 spine, on longterm bisphosphonate/evista   Psoriasis    Pyogenic granuloma 04/16/2016    Past Surgical History:  Procedure Laterality Date   BASAL CELL CARCINOMA EXCISION     located on face   BREAST BIOPSY Left    core done ing [place]?   BREAST BIOPSY Left 08/16/2020   3 area bx 4:00 X, IMC, 6:00Q IMC, LN hydro #3-benign   BREAST LUMPECTOMY Left 09/11/2020   two areas of [facility]   CATARACT EXTRACTION     bilateral   DEXA  06/2009   T score Spine: -1.8, hip -2.0   EXCISION OF BREAST BIOPSY Left 09/11/2020   Procedure: EXCISION OF BREAST BIOPSY;  Surgeon: [name], MD;  Location: [facility] ORS;  Service: General;   Laterality: Left;   Foam sclerotherapy Bilateral 09/2015   [place], [place]   MASTECTOMY W/ SENTINEL NODE BIOPSY Left 06/25/2021   Procedure: MASTECTOMY WITH SENTINEL LYMPH NODE BIOPSY;  Surgeon: [name], MD;  Location: [facility] ORS;  Service: General;  Laterality: Left;   NASAL RECONSTRUCTION  2005   for [place] L nare s/p Mohs   nuclear stress test  2014   normal in [place] BX Left 09/11/2020   Procedure: PARTIAL MASTECTOMY WITH NEEDLE LOCALIZATION AND AXILLARY SENTINEL LYMPH NODE [place];  Surgeon: [name], MD;  Location: [facility] ORS;  Service: General;  Laterality: Left;   RADIOFREQUENCY ABLATION Left 01/2012   remnant L G saphenous vein below knee   RADIOFREQUENCY ABLATION Right 02/2013   RLE vein ablation    VEIN LIGATION AND STRIPPING  remote   bilateral G saphenous veins    There were no vitals filed for this visit.   Subjective Assessment - 10/23/21 1420     Subjective Has not been able to do her HEP. Has been pretty busy. Did not notice L heel pain when walking. Still  has tenderness  Used a knee brace yesterday which is helping. L knee felt better after session.    Pertinent History Neck and achilles pain. L knee also bothers her. Had injection L knee which helped 10 days before her breast surgery June 25, 2021. Dr. [name] also states Pt needs surgery for her L knee (TKA). Pt however has a heel spur on L foot which is chronic. Went to 3 specialists for heel spur and was told that there is nothing can be done surgically. The heel pain however affects her gait which affects her L knee increasing L knee pain. Pt continues to do her HEP from previous PT. Neck pain began about 6 months ago gradually. Started feeling tightness from R medial shoulder blade to neck. Pt is R hand dominant. L foot bothers her more currently.    Patient Stated Goals Learn how to mitigate L heel pain.    Currently  in Pain? No/denies    Pain Onset More than a month ago                                        PT Education - 10/23/21 1610     Education Details ther-ex    [employer]) Educated Patient    Methods Explanation;Demonstration;Tactile cues;Verbal cues    Comprehension Returned demonstration;Verbalized understanding              Objective     No latex band allergies   B varicose veins legs Osteopenia     L heel pain Hx of L mastectomy on June 25, 2021 and had home health PT for recovery.      Started using a SPC 5 months ago when ambulating by herself. Furniture walk at home.    [employer] Access Code [email or web address]     Manual therapy  Seated STM/cross friction technique L heel/Achilles area to promote blood flow as well as decrease fascial restrictions.    Seated STM L plantar foot to decrease fascial restrictions   Seated STM L lateral hamstrings to decrease tension.              Decreased L knee pain with gait afterwards.       Therapeutic exercise   Seated manually resisted L ankle PF isometrics 40% effort, PT resistance 1 min x 5    Seated manually resisted L ankle PF eccentric 10x3 with PT    Pt education on keeping L knee clean after procedure especially since pt has fungus L foot. Pt verbalized understanding.   Improved exercise technique, movement at target joints, use of target muscles after mod verbal, visual, tactile cues.        Response to treatment Pt tolerated session well without aggravation of L heel pain. Decreased L knee pain with gait as well after session.      Clinical Impression Pt making very good progress with decreased overall L heel pain. Also worked on decreasing lateral hamstrings muscle tension to promote better L knee mechanics during gait to decrease L knee pain. Improved L knee comfort level with gait after session. Pt will benefit from continued skilled physical therapy services to decrease pain, improve  strength and function.          PT Short Term Goals - 10/09/21 1422       PT SHORT TERM GOAL #1   Title Patient will be independent with her initial HEP  to improve strength, decrease neck and L heel pain, improve function.    Baseline Does her HEP, no questions (10/09/2021)    Time 3    Period Weeks    Status Achieved    Target Date 09/25/21               PT Long Term Goals - 10/09/21 1422       PT LONG TERM GOAL #1   Title Pt will have a decrease in L heel pain to 4/10 or less at worst to promote ability to ambulate, perform standing tasks more comfortably.    Baseline 8/10 L heel/Achilles pain at worst for the past 3 months (09/04/2021);  5/10 at worst (10/07/2021)    Time 8    Period Weeks    Status Partially Met    Target Date 10/30/21      PT LONG TERM GOAL #2   Title Pt will improve bilateral hip extension and abduction strength by at least 1/2 MMT grade to promote femoral control and ability to ambulate and perform closed chain tasks with less L heel/Achilles pain.    Baseline Hip extension 4-/5 R, 3+/5 L, hip abduction 3-/5 R, 4-/5 L (09/04/2021); 4+/5 R, 4/5 L, hip abduction 4-/5 R, 4/5 L (L knee pain) 10/09/2021    Time 8    Period Weeks    Status Partially Met    Target Date 10/30/21      PT LONG TERM GOAL #3   Title Pt will improve her L foot FOTO score by at least 10 points as a demonstration of improved function.    Baseline L foot FOTO 38 (09/04/2021)    Time 8    Period Weeks    Status New    Target Date 10/30/21      PT LONG TERM GOAL #4   Title Pt will have a decrease in neck pain to 2/10 or less at worst to promote ability to look around more comfortably.    Baseline 5/10 neck pain at worst for the past 3 months (09/04/2021), (10/07/2021)    Time 8    Period Weeks    Status On-going    Target Date 10/30/21                   Plan - 10/23/21 1415     Clinical Impression Statement Pt making very good progress with decreased overall L  heel pain. Also worked on decreasing lateral hamstrings muscle tension to promote better L knee mechanics during gait to decrease L knee pain. Improved L knee comfort level with gait after session. Pt will benefit from continued skilled physical therapy services to decrease pain, improve strength and function.    Personal Factors and Comorbidities Age;Comorbidity 3+;Time since onset of injury/illness/exacerbation;Fitness    Comorbidities Osteopenia, chronic venous insufficiency,Hx of basal cell CA S/P lumpectomy, HTN    Examination-Activity Limitations Stand;Locomotion Level;Squat;Stairs;Carry;Lift    Stability/Clinical Decision Making Stable/Uncomplicated    Clinical Decision Making Low    Rehab Potential Fair    Clinical Impairments Affecting Rehab Potential Chronicity of condition, age, fitness level    PT Frequency 2x / week    PT Duration 8 weeks    PT Treatment/Interventions Aquatic Therapy;Electrical Stimulation;Iontophoresis 44m/ml Dexamethasone;Gait training;Stair training;Functional mobility training;Therapeutic activities;Therapeutic exercise;Balance training;Neuromuscular re-education;Patient/family education;Manual techniques;Dry needling   electrical stimulation related treatment and ultrasound if appropriate   PT Next Visit Plan isometric plantar flexion, glute, trunk, scapular strengthening, femoral control, manual techniques, modalities PRN  Consulted and Agree with Plan of Care Patient             Patient will benefit from skilled therapeutic intervention in order to improve the following deficits and impairments:  Postural dysfunction, Decreased strength, Improper body mechanics, Pain, Difficulty walking  Visit Diagnosis: Difficulty in walking, not elsewhere classified  Pain in left foot  Cervicalgia     Problem List Patient Active Problem List   Diagnosis Date Noted   Acute respiratory infection 10/06/2021   Pre-op evaluation 06/02/2021   Right wrist pain  02/28/2021   Skin rash 02/28/2021   Ascending aorta dilatation ([place]) 01/10/2021   Left-sided chest pain 01/10/2021   Genetic testing 10/30/2020   Malignant neoplasm of overlapping sites of left breast in female, estrogen receptor positive ([place]) 10/22/2020   Family history of breast cancer    Family history of thyroid cancer    Insertional Achilles tendinopathy 08/28/2020   Breast cancer, left breast ([place]) 08/01/2020   Right carotid bruit 06/05/2020   Chest pressure 06/03/2020   Left Achilles tendinitis 09/01/2019   Low back pain 08/29/2019   Lumbosacral spondylosis without myelopathy 08/29/2019   Calcaneal spur 08/17/2019   Contusion of knee 10/04/2018   Varicose veins of bilateral lower extremities with pain 03/30/2018   Lymphedema 03/30/2018   Dizziness 02/02/2017   Situational depression 12/18/2016   Vitamin D deficiency 10/11/2016   Fatigue 08/12/2016   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 04/14/2016   Essential hypertension 12/17/2015   Dermatitis of external ear 12/17/2015   [place] of foot 05/16/2015   Advanced care planning/counseling discussion 10/30/2014   Neck pain on right side 06/25/2014   Primary osteoarthritis of left knee 08/17/2013   Oropharyngeal dysphagia 11/15/2012   Stressful life events affecting family and household 03/30/2012   Medicare annual wellness visit, subsequent 03/30/2012   History of basal cell carcinoma    Osteoarthritis    Osteopenia    Hypothyroidism    Glaucoma    Chronic venous insufficiency     Joneen Boers PT, DPT   10/23/2021, 4:18 PM  [facility] [address] [place], [address] Phone: [phone]   Fax:  [phone]  Name: Beth Rodgers MRN: [id number] Date of Birth: 11/03/1935

## 2021-10-27 ENCOUNTER — Other Ambulatory Visit: Payer: Self-pay

## 2021-10-27 ENCOUNTER — Encounter: Payer: Self-pay | Admitting: Family Medicine

## 2021-10-27 ENCOUNTER — Ambulatory Visit (INDEPENDENT_AMBULATORY_CARE_PROVIDER_SITE_OTHER): Payer: Medicare Other | Admitting: Family Medicine

## 2021-10-27 DIAGNOSIS — J22 Unspecified acute lower respiratory infection: Secondary | ICD-10-CM | POA: Diagnosis not present

## 2021-10-27 MED ORDER — BENZONATATE 100 MG PO CAPS
100.0000 mg | ORAL_CAPSULE | Freq: Three times a day (TID) | ORAL | 0 refills | Status: DC | PRN
Start: 2021-10-27 — End: 2021-12-08

## 2021-10-27 NOTE — Progress Notes (Signed)
Patient ID: Beth Rodgers, female    DOB: 07-12-1935, 86 y.o.   MRN: [id number]  This visit was conducted in person.  BP 138/74   Pulse 74   Temp 98 F (36.7 C) (Temporal)   Ht 5\' 3"  (1.6 m)   Wt 170 lb 6 oz (77.3 kg)   SpO2 98%   BMI 30.18 kg/m    CC: 3 wk f/u visit  Subjective:   HPI: Beth Rodgers is a 86 y.o. female presenting on 10/27/2021 for Follow-up (Here for 3 wk f/u.  Still c/o prod cough.  Finished azithromycin.  Started cefdinir.)   See prior note for details.   Known L breast cancer s/p mastectomy ([facility]ron-Diaz) 05/2021. Saw oncology Dr [name] Banks afterwards - didn't think would need postmastectomy radiation. Intolerant to previous aromatase inhibitors tried.   Ongoing productive cough for the past month, seen 10/10 treated with zpack. Cough has persisted. More recently started on cefdinir 10/27 (augmentin was on back order). Cough is productive of significant white mucous.   No wheezing, dyspnea, fever, ST or significant head congestion.   Wants to travel to [place] in March.      Relevant past medical, surgical, family and social history reviewed and updated as indicated. Interim medical history since our last visit reviewed. Allergies and medications reviewed and updated. Outpatient Medications Prior to Visit  Medication Sig Dispense Refill   alendronate (FOSAMAX) 70 MG tablet Take 70 mg by mouth every Sunday.     Calcium Carb-Cholecalciferol (CALCIUM 600 + D PO) Take 1 tablet by mouth daily.     calcium carbonate (TUMS EX) 750 MG chewable tablet Chew 750 mg by mouth daily as needed for heartburn.     cefdinir (OMNICEF) 300 MG capsule Take 1 capsule (300 mg total) by mouth 2 (two) times daily. 14 capsule 0   Cholecalciferol (VITAMIN D) 50 MCG (2000 UT) CAPS Take 1 capsule (2,000 Units total) by mouth daily. 30 capsule    Ciclopirox 0.77 % gel Apply 1 application topically 2 (two) times daily as needed (fungus).     clobetasol cream (TEMOVATE) 4.19 % Apply 1  application topically as directed. Qd to bid up to 5 days a week to aa psoriasis on body until clear, then prn flares, avoid face, groin, axilla (Patient taking differently: Apply 1 application topically 2 (two) times daily.) 60 g 2   diclofenac sodium (VOLTAREN) 1 % GEL Apply 1 application topically 3 (three) times daily. (Patient taking differently: Apply 1 application topically 3 (three) times daily as needed (pain).) 1 Tube 0   levothyroxine (SYNTHROID) 75 MCG tablet Take 1 tablet (75 mcg total) by mouth daily. 90 tablet 3   losartan (COZAAR) 100 MG tablet TAKE 1 TABLET BY MOUTH EVERY DAY 90 tablet 3   Magnesium 400 MG TABS Take 400 mg by mouth daily.     Multiple Vitamin (MULTIVITAMIN) tablet Take 1 tablet by mouth daily.     NITROSTAT 0.4 MG SL tablet Place 0.4 mg under the tongue every 5 (five) minutes as needed for chest pain.      SELENIUM PO Take by mouth daily.     silver sulfADIAZINE (SILVADENE) 1 % cream Apply 1 application topically daily. (Patient taking differently: Apply 1 application topically 2 (two) times daily.) 50 g 2   tacrolimus (PROTOPIC) 0.1 % ointment APPLY TOPICALLY TO AFFECTED AREA(S) TWICE DAILY 30 g 0   Travoprost, BAK Free, (TRAVATAN) 0.004 % SOLN ophthalmic solution Place 1 drop into  both eyes at bedtime.     vitamin C (ASCORBIC ACID) 500 MG tablet Take 500 mg by mouth daily.     vitamin E 180 MG (400 UNITS) capsule Take 400 Units by mouth daily.     azithromycin (ZITHROMAX) 250 MG tablet Take two tablets on day one followed by one tablet on days 2-5 6 each 0   No facility-administered medications prior to visit.     Per HPI unless specifically indicated in ROS section below Review of Systems  Objective:  BP 138/74   Pulse 74   Temp 98 F (36.7 C) (Temporal)   Ht 5\' 3"  (1.6 m)   Wt 170 lb 6 oz (77.3 kg)   SpO2 98%   BMI 30.18 kg/m   Wt Readings from Last 3 Encounters:  10/27/21 170 lb 6 oz (77.3 kg)  10/16/21 172 lb 4 oz (78.1 kg)  10/06/21 171 lb 3  oz (77.7 kg)      Physical Exam Vitals and nursing note reviewed.  Constitutional:      Appearance: Normal appearance. She is not ill-appearing.  HENT:     Head: Normocephalic and atraumatic.     Mouth/Throat:     Mouth: Mucous membranes are moist.     Pharynx: Oropharynx is clear. No oropharyngeal exudate or posterior oropharyngeal erythema.  Eyes:     Extraocular Movements: Extraocular movements intact.     Pupils: Pupils are equal, round, and reactive to light.  Cardiovascular:     Rate and Rhythm: Normal rate and regular rhythm.     Pulses: Normal pulses.     Heart sounds: Normal heart sounds. No murmur heard. Pulmonary:     Effort: Pulmonary effort is normal. No respiratory distress.     Breath sounds: Normal breath sounds. No wheezing, rhonchi or rales.     Comments: Lungs clear Neurological:     Mental Status: She is alert.  Psychiatric:        Mood and Affect: Mood normal.        Behavior: Behavior normal.       Assessment & Plan:  This visit occurred during the SARS-CoV-2 public health emergency.  Safety protocols were in place, including screening questions prior to the visit, additional usage of staff PPE, and extensive cleaning of exam room while observing appropriate contact time as indicated for disinfecting solutions.   Problem List Items Addressed This Visit     Acute respiratory infection    Ongoing productive cough for the past month, however she notes ongoing improvement, albeit slowly.  She's been taking cefdinir 300mg  once daily - discussed correct dosing is BID - she will go ahead and start correct sig. Lungs sound clear today and pt overall well apearing - will defer CXR at this time, she will let me know if not improving with above for CXR as next step.  Rx tessalon perls in place of mucinex.  Discussed possible prednisone taper, will defer for now. Pt agrees with plan.         Meds ordered this encounter  Medications   benzonatate (TESSALON)  100 MG capsule    Sig: Take 1 capsule (100 mg total) by mouth 3 (three) times daily as needed for cough.    Dispense:  30 capsule    Refill:  0   No orders of the defined types were placed in this encounter.   Patient Instructions  La dosis de cefdinir es dos veces al dia. Termine este antibiotico.  Puede tratar  perlas de tessalon - las he mandado a Engineer, production.  Si no mejora, dejenos saber para rayo x.  Puede parar mucinex.   Follow up plan: Return if symptoms worsen or fail to improve.  Ria Bush, MD

## 2021-10-27 NOTE — Assessment & Plan Note (Addendum)
Ongoing productive cough for the past month, however she notes ongoing improvement, albeit slowly.  She's been taking cefdinir 300mg  once daily - discussed correct dosing is BID - she will go ahead and start correct sig. Lungs sound clear today and pt overall well apearing - will defer CXR at this time, she will let me know if not improving with above for CXR as next step.  Rx tessalon perls in place of mucinex.  Discussed possible prednisone taper, will defer for now. Pt agrees with plan.

## 2021-10-27 NOTE — Patient Instructions (Addendum)
La dosis de cefdinir [place] al dia. Termine este antibiotico.  Puede tratar perlas de [place] he mandado a su farmacia.  Si no mejora, dejenos saber para rayo x.  Puede parar mucinex.

## 2021-10-28 ENCOUNTER — Ambulatory Visit (INDEPENDENT_AMBULATORY_CARE_PROVIDER_SITE_OTHER): Payer: Medicare Other | Admitting: Psychology

## 2021-10-28 DIAGNOSIS — F4323 Adjustment disorder with mixed anxiety and depressed mood: Secondary | ICD-10-CM

## 2021-10-29 ENCOUNTER — Ambulatory Visit: Payer: Medicare Other | Attending: Family Medicine

## 2021-10-29 DIAGNOSIS — M542 Cervicalgia: Secondary | ICD-10-CM | POA: Insufficient documentation

## 2021-10-29 DIAGNOSIS — R262 Difficulty in walking, not elsewhere classified: Secondary | ICD-10-CM | POA: Insufficient documentation

## 2021-10-29 DIAGNOSIS — M79672 Pain in left foot: Secondary | ICD-10-CM | POA: Diagnosis not present

## 2021-10-29 NOTE — Patient Instructions (Signed)
Provided red and green bands for when eccentric plantar flexion home exercise gets easier.

## 2021-10-29 NOTE — Therapy (Addendum)
[facility] [address], [place], [address] Phone: [phone]   Fax:  [phone]  Physical Therapy Treatment  Patient Details  Name: Beth Rodgers MRN: [id number] Date of Birth: 1935/12/12 Referring Provider (PT): Owens Loffler, MD   Encounter Date: 10/29/2021   PT End of Session - 10/29/21 1421     Visit Number 14    Number of Visits 17    Date for PT Re-Evaluation 12/25/21    Authorization Type 4    Authorization Time Period 10    PT Start Time 1421    PT Stop Time 1500    PT Time Calculation (min) 39 min    Activity Tolerance Patient tolerated treatment well    Behavior During Therapy [facility] for tasks assessed/performed             Past Medical History:  Diagnosis Date   Basal cell carcinoma ([place])    txted with MOHs in past   Cellulitis 08/10/2018   Chronic venous insufficiency    with varicose veins   Family history of breast cancer    Family history of thyroid cancer    GERD (gastroesophageal reflux disease)    Glaucoma    History  of basal cell carcinoma    left nare/BREAST CANCER   History of chicken pox    Hypertension    Hypoglycemia    Hypothyroid    Osteoarthritis    back pain, L knee pain   Osteopenia 06/2009   DEXA 11/2015: T -2.3 hip, -2.2 spine, on longterm bisphosphonate/evista   Psoriasis    Pyogenic granuloma 04/16/2016    Past Surgical History:  Procedure Laterality Date   BASAL CELL CARCINOMA EXCISION     located on face   BREAST BIOPSY Left    core done ing [place]?   BREAST BIOPSY Left 08/16/2020   3 area bx 4:00 X, IMC, 6:00Q IMC, LN hydro #3-benign   BREAST LUMPECTOMY Left 09/11/2020   two areas of [facility]   CATARACT EXTRACTION     bilateral   DEXA  06/2009   T score Spine: -1.8, hip -2.0   EXCISION OF BREAST BIOPSY Left 09/11/2020   Procedure: EXCISION OF BREAST BIOPSY;  Surgeon: [name], MD;  Location: [facility] ORS;  Service: General;   Laterality: Left;   Foam sclerotherapy Bilateral 09/2015   Reed Pandy, [place]   MASTECTOMY W/ SENTINEL NODE BIOPSY Left 06/25/2021   Procedure: MASTECTOMY WITH SENTINEL LYMPH NODE BIOPSY;  Surgeon: [name], MD;  Location: [facility] ORS;  Service: General;  Laterality: Left;   NASAL RECONSTRUCTION  2005   for [place] L nare s/p Mohs   nuclear stress test  2014   normal in [place] BX Left 09/11/2020   Procedure: PARTIAL MASTECTOMY WITH NEEDLE LOCALIZATION AND AXILLARY SENTINEL LYMPH NODE [place];  Surgeon: [name], MD;  Location: [facility] ORS;  Service: General;  Laterality: Left;   RADIOFREQUENCY ABLATION Left 01/2012   remnant L G saphenous vein below knee   RADIOFREQUENCY ABLATION Right 02/2013   RLE vein ablation    VEIN LIGATION AND STRIPPING  remote   bilateral G saphenous veins    There were no vitals filed for this visit.   Subjective Assessment - 10/29/21 1423     Subjective Pt state she is so happy. Was better able to walk in her garden. Heel feels a lot better. Feels like  she still needs PT to improve.    Pertinent History Neck and achilles pain. L knee also bothers her. Had injection L knee which helped 10 days before her breast surgery June 25, 2021. Dr. [name] also states Pt needs surgery for her L knee (TKA). Pt however has a heel spur on L foot which is chronic. Went to 3 specialists for heel spur and was told that there is nothing can be done surgically. The heel pain however affects her gait which affects her L knee increasing L knee pain. Pt continues to do her HEP from previous PT. Neck pain began about 6 months ago gradually. Started feeling tightness from R medial shoulder blade to neck. Pt is R hand dominant. L foot bothers her more currently.    Patient Stated Goals Learn how to mitigate L heel pain.    Currently in Pain? Yes    Pain Score 2     Pain Location Heel    Pain  Onset More than a month ago                                        PT Education - 10/29/21 1424     Education Details ther-ex    Person(s) Educated Patient    Methods Explanation;Demonstration;Tactile cues;Verbal cues    Comprehension Returned demonstration;Verbalized understanding            Objective     No latex band allergies   B varicose veins legs Osteopenia     L heel pain Hx of L mastectomy on June 25, 2021 and had home health PT for recovery.      Started using a SPC 5 months ago when ambulating by herself. Furniture walk at home.    Medbridge Access Code [email or web address]     Manual therapy  Seated STM/cross friction technique L heel/Achilles area to promote blood flow as well as decrease fascial restrictions.    Seated STM L plantar foot to decrease fascial restrictions      Therapeutic exercise  Seated manually resisted hip extension, clamshell isometrics (L knee pain with S/L hip abduction )   Hip extension 4+/5 R, 4/5 L   Reviewed Plan of care   Seated manually resisted L ankle PF isometrics 40% effort, PT resistance 1 min x 5    Seated manually resisted L ankle PF eccentric 10x3 with PT    Seated L hip abduction, foot in furniture slider, hips less than 90 degrees flexion 10x2       Improved exercise technique, movement at target joints, use of target muscles after mod verbal, visual, tactile cues.        Response to treatment Pt tolerated session well without aggravation of L heel pain. Decreased L knee pain with gait as well after session.      Clinical Impression Pt demonstrates decreased L heel and neck pain as well as improved hip extension strength and L foot overall function since initial evaluatin. Pt reports better able to ambulate in her garden today. Pt making very good progress with PT towards goals and will benefit from continued skilled physical therapy services to promote return to prior level of  function.              PT Short Term Goals - 10/09/21 1422       PT SHORT TERM GOAL #1   Title Patient will be  independent with her initial HEP to improve strength, decrease neck and L heel pain, improve function.    Baseline Does her HEP, no questions (10/09/2021)    Time 3    Period Weeks    Status Achieved    Target Date 09/25/21               PT Long Term Goals - 10/29/21 1425       PT LONG TERM GOAL #1   Title Pt will have a decrease in L heel pain to 4/10 or less at worst to promote ability to ambulate, perform standing tasks more comfortably.    Baseline 8/10 L heel/Achilles pain at worst for the past 3 months (09/04/2021);  5/10 at worst (10/07/2021); 4/10 at most for the past 7 days, most of the time, 2/10, worst is getting up in the middle of the night to go to the bathroom (10/29/2021)    Time 8    Period Weeks    Status Achieved    Target Date 10/30/21      PT LONG TERM GOAL #2   Title Pt will improve bilateral hip extension and abduction strength by at least 1/2 MMT grade to promote femoral control and ability to ambulate and perform closed chain tasks with less L heel/Achilles pain.    Baseline Hip extension 4-/5 R, 3+/5 L, hip abduction 3-/5 R, 4-/5 L (09/04/2021); 4+/5 R, 4/5 L, hip abduction 4-/5 R, 4/5 L (L knee pain) 10/09/2021; Hip extension 4+/5 R, 4/5 L. Hip abduction in S/L  not tested secondary to L knee pain  (10/29/2021)    Time 8    Period Weeks    Status Partially Met    Target Date 12/25/21      PT LONG TERM GOAL #3   Title Pt will improve her L foot FOTO score by at least 10 points as a demonstration of improved function.    Baseline L foot FOTO 38 (09/04/2021); 43 (10/29/2021)    Time 8    Period Weeks    Status On-going    Target Date 12/25/21      PT LONG TERM GOAL #4   Title Pt will have a decrease in neck pain to 2/10 or less at worst to promote ability to look around more comfortably.    Baseline 5/10 neck pain at worst for the past  3 months (09/04/2021), (10/07/2021), 1.5/10 at most for the past 7 day (10/29/2021)    Time 8    Period Weeks    Status Achieved    Target Date 10/30/21                   Plan - 10/29/21 1730     Clinical Impression Statement Pt demonstrates decreased L heel and neck pain as well as improved hip extension strength and L foot overall function since initial evaluatin. Pt reports better able to ambulate in her garden today. Pt making very good progress with PT towards goals and will benefit from continued skilled physical therapy services to promote return to prior level of function.    Personal Factors and Comorbidities Age;Comorbidity 3+;Time since onset of injury/illness/exacerbation;Fitness    Comorbidities Osteopenia, chronic venous insufficiency,Hx of basal cell CA S/P lumpectomy, HTN    Examination-Activity Limitations Stand;Locomotion Level;Squat;Stairs;Carry;Lift    Stability/Clinical Decision Making Stable/Uncomplicated    Clinical Decision Making Low    Rehab Potential Fair    Clinical Impairments Affecting Rehab Potential Chronicity of condition, age, fitness level  PT Frequency 2x / week    PT Duration 8 weeks    PT Treatment/Interventions Aquatic Therapy;Electrical Stimulation;Iontophoresis 4mg /ml Dexamethasone;Gait training;Stair training;Functional mobility training;Therapeutic activities;Therapeutic exercise;Balance training;Neuromuscular re-education;Patient/family education;Manual techniques;Dry needling   electrical stimulation related treatment and ultrasound if appropriate   PT Next Visit Plan isometric plantar flexion, glute, trunk, scapular strengthening, femoral control, manual techniques, modalities PRN    Consulted and Agree with Plan of Care Patient             Patient will benefit from skilled therapeutic intervention in order to improve the following deficits and impairments:  Postural dysfunction, Decreased strength, Improper body mechanics, Pain,  Difficulty walking  Visit Diagnosis: Difficulty in walking, not elsewhere classified - Plan: PT plan of care cert/re-cert  Pain in left foot - Plan: PT plan of care cert/re-cert  Cervicalgia - Plan: PT plan of care cert/re-cert     Problem List Patient Active Problem List   Diagnosis Date Noted   Acute respiratory infection 10/06/2021   Pre-op evaluation 06/02/2021   Right wrist pain 02/28/2021   Skin rash 02/28/2021   Ascending aorta dilatation ([place]) 01/10/2021   Left-sided chest pain 01/10/2021   Genetic testing 10/30/2020   Malignant neoplasm of overlapping sites of left breast in female, estrogen receptor positive ([place]) 10/22/2020   Family history of breast cancer    Family history of thyroid cancer    Insertional Achilles tendinopathy 08/28/2020   Breast cancer, left breast ([place]) 08/01/2020   Right carotid bruit 06/05/2020   Chest pressure 06/03/2020   Left Achilles tendinitis 09/01/2019   Low back pain 08/29/2019   Lumbosacral spondylosis without myelopathy 08/29/2019   Calcaneal spur 08/17/2019   Contusion of knee 10/04/2018   Varicose veins of bilateral lower extremities with pain 03/30/2018   Lymphedema 03/30/2018   Dizziness 02/02/2017   Situational depression 12/18/2016   Vitamin D deficiency 10/11/2016   Fatigue 08/12/2016   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 04/14/2016   Essential hypertension 12/17/2015   Dermatitis of external ear 12/17/2015   Corn of foot 05/16/2015   Advanced care planning/counseling discussion 10/30/2014   Neck pain on right side 06/25/2014   Primary osteoarthritis of left knee 08/17/2013   Oropharyngeal dysphagia 11/15/2012   Stressful life events affecting family and household 03/30/2012   Medicare annual wellness visit, subsequent 03/30/2012   History of basal cell carcinoma    Osteoarthritis    Osteopenia    Hypothyroidism    Glaucoma    Chronic venous insufficiency     Joneen Boers PT,  DPT   10/29/2021, 5:34 PM   [facility] [address], [place], [address] Phone: [phone]   Fax:  [phone]  Name: Beth Rodgers MRN: [id number] Date of Birth: [birth year]

## 2021-10-29 NOTE — Addendum Note (Signed)
Addended by: Madaline Savage on: 10/29/2021 05:35 PM   Modules accepted: Orders

## 2021-11-05 ENCOUNTER — Ambulatory Visit: Payer: Medicare Other

## 2021-11-05 DIAGNOSIS — R262 Difficulty in walking, not elsewhere classified: Secondary | ICD-10-CM | POA: Diagnosis not present

## 2021-11-05 DIAGNOSIS — M79672 Pain in left foot: Secondary | ICD-10-CM

## 2021-11-05 DIAGNOSIS — M542 Cervicalgia: Secondary | ICD-10-CM

## 2021-11-05 NOTE — Therapy (Signed)
[facility] [address], [place], [address] Phone: [phone]   Fax:  [phone]  Physical Therapy Treatment  Patient Details  Name: Beth Rodgers MRN: [id number] Date of Birth: Apr 27, 1935 Referring Provider (PT): Owens Loffler, MD   Encounter Date: 11/05/2021   PT End of Session - 11/05/21 1604     Visit Number 15    Number of Visits 17    Date for PT Re-Evaluation 12/25/21    Authorization Type 5    Authorization Time Period 10    PT Start Time 1604    PT Stop Time 1645    PT Time Calculation (min) 41 min    Activity Tolerance Patient tolerated treatment well    Behavior During Therapy [facility] for tasks assessed/performed             Past Medical History:  Diagnosis Date   Basal cell carcinoma ([place])    txted with MOHs in past   Cellulitis 08/10/2018   Chronic venous insufficiency    with varicose veins   Family history of breast cancer    Family history of thyroid cancer    GERD (gastroesophageal reflux disease)    Glaucoma    History  of basal cell carcinoma    left nare/BREAST CANCER   History of chicken pox    Hypertension    Hypoglycemia    Hypothyroid    Osteoarthritis    back pain, L knee pain   Osteopenia 06/2009   DEXA 11/2015: T -2.3 hip, -2.2 spine, on longterm bisphosphonate/evista   Psoriasis    Pyogenic granuloma 04/16/2016    Past Surgical History:  Procedure Laterality Date   BASAL CELL CARCINOMA EXCISION     located on face   BREAST BIOPSY Left    core done ing [facility]?   BREAST BIOPSY Left 08/16/2020   3 area bx 4:00 X, IMC, 6:00Q IMC, LN hydro #3-benign   BREAST LUMPECTOMY Left 09/11/2020   two areas of [facility]   CATARACT EXTRACTION     bilateral   DEXA  06/2009   T score Spine: -1.8, hip -2.0   EXCISION OF BREAST BIOPSY Left 09/11/2020   Procedure: EXCISION OF BREAST BIOPSY;  Surgeon: [name], MD;  Location: [facility] ORS;  Service: General;   Laterality: Left;   Foam sclerotherapy Bilateral 09/2015   Reed [place], [place]   MASTECTOMY W/ SENTINEL NODE BIOPSY Left 06/25/2021   Procedure: MASTECTOMY WITH SENTINEL LYMPH NODE BIOPSY;  Surgeon: [name], MD;  Location: [facility] ORS;  Service: General;  Laterality: Left;   NASAL RECONSTRUCTION  2005   for [place] L nare s/p Mohs   nuclear stress test  2014   normal in [place] BX Left 09/11/2020   Procedure: PARTIAL MASTECTOMY WITH NEEDLE LOCALIZATION AND AXILLARY SENTINEL LYMPH NODE [place];  Surgeon: [name], MD;  Location: [facility] ORS;  Service: General;  Laterality: Left;   RADIOFREQUENCY ABLATION Left 01/2012   remnant L G saphenous vein below knee   RADIOFREQUENCY ABLATION Right 02/2013   RLE vein ablation    VEIN LIGATION AND STRIPPING  remote   bilateral G saphenous veins    There were no vitals filed for this visit.   Subjective Assessment - 11/05/21 1606     Subjective L heel is improving. It has its moments.    Pertinent History Neck and achilles pain. L knee also bothers  her. Had injection L knee which helped 10 days before her breast surgery June 25, 2021. Dr. [name] also states Pt needs surgery for her L knee (TKA). Pt however has a heel spur on L foot which is chronic. Went to 3 specialists for heel spur and was told that there is nothing can be done surgically. The heel pain however affects her gait which affects her L knee increasing L knee pain. Pt continues to do her HEP from previous PT. Neck pain began about 6 months ago gradually. Started feeling tightness from R medial shoulder blade to neck. Pt is R hand dominant. L foot bothers her more currently.    Patient Stated Goals Learn how to mitigate L heel pain.    Currently in Pain? Other (Comment)   Did not notice the pain.   Pain Onset More than a month ago                                         PT Education - 11/05/21 1657     Education Details ther-ex    Person(s) Educated Patient    Methods Explanation;Demonstration;Tactile cues;Verbal cues    Comprehension Returned demonstration;Verbalized understanding           Objective     No latex band allergies   B varicose veins legs Osteopenia     L heel pain Hx of L mastectomy on June 25, 2021 and had home health PT for recovery.      Started using a SPC 5 months ago when ambulating by herself. Furniture walk at home.    Medbridge Access Code [email or web address]     Manual therapy  Seated STM/cross friction technique L heel/Achilles area to promote blood flow as well as decrease fascial restrictions.    Seated STM L plantar foot to decrease fascial restrictions      Therapeutic exercise     Seated manually resisted L ankle PF isometrics 40% effort, PT resistance 1 min x 5    Seated manually resisted L ankle PF eccentric 10x3 with PT    Seated L hip abduction, foot in furniture slider, hips less than 90 degrees flexion 10x4 with PT manual resistance  Seated manually resisted hip extension with PT resistance  L 10x2       Improved exercise technique, movement at target joints, use of target muscles after mod verbal, visual, tactile cues.        Response to treatment Pt tolerated session well without aggravation of L heel pain. Decreased L knee pain with gait as well after session.      Clinical Impression Pt making very good progress with L heel pain based on subjective reports. Continued working on improving soft tissue movement and gentle strengthening to promote healing as well as improve femoral control during gait to promote better mechanics at her foot. Pt tolerated session well without aggravation of symptoms. Pt will benefit from continued skilled physical therapy services to decrease pain, improve strength and function.       PT Short Term Goals  - 10/09/21 1422       PT SHORT TERM GOAL #1   Title Patient will be independent with her initial HEP to improve strength, decrease neck and L heel pain, improve function.    Baseline Does her HEP, no questions (10/09/2021)    Time 3    Period Weeks  Status Achieved    Target Date 09/25/21               PT Long Term Goals - 10/29/21 1425       PT LONG TERM GOAL #1   Title Pt will have a decrease in L heel pain to 4/10 or less at worst to promote ability to ambulate, perform standing tasks more comfortably.    Baseline 8/10 L heel/Achilles pain at worst for the past 3 months (09/04/2021);  5/10 at worst (10/07/2021); 4/10 at most for the past 7 days, most of the time, 2/10, worst is getting up in the middle of the night to go to the bathroom (10/29/2021)    Time 8    Period Weeks    Status Achieved    Target Date 10/30/21      PT LONG TERM GOAL #2   Title Pt will improve bilateral hip extension and abduction strength by at least 1/2 MMT grade to promote femoral control and ability to ambulate and perform closed chain tasks with less L heel/Achilles pain.    Baseline Hip extension 4-/5 R, 3+/5 L, hip abduction 3-/5 R, 4-/5 L (09/04/2021); 4+/5 R, 4/5 L, hip abduction 4-/5 R, 4/5 L (L knee pain) 10/09/2021; Hip extension 4+/5 R, 4/5 L. Hip abduction in S/L  not tested secondary to L knee pain  (10/29/2021)    Time 8    Period Weeks    Status Partially Met    Target Date 12/25/21      PT LONG TERM GOAL #3   Title Pt will improve her L foot FOTO score by at least 10 points as a demonstration of improved function.    Baseline L foot FOTO 38 (09/04/2021); 43 (10/29/2021)    Time 8    Period Weeks    Status On-going    Target Date 12/25/21      PT LONG TERM GOAL #4   Title Pt will have a decrease in neck pain to 2/10 or less at worst to promote ability to look around more comfortably.    Baseline 5/10 neck pain at worst for the past 3 months (09/04/2021), (10/07/2021), 1.5/10 at most for  the past 7 day (10/29/2021)    Time 8    Period Weeks    Status Achieved    Target Date 10/30/21                   Plan - 11/05/21 1603     Clinical Impression Statement Pt making very good progress with L heel pain based on subjective reports. Continued working on improving soft tissue movement and gentle strengthening to promote healing as well as improve femoral control during gait to promote better mechanics at her foot. Pt tolerated session well without aggravation of symptoms. Pt will benefit from continued skilled physical therapy services to decrease pain, improve strength and function.    Personal Factors and Comorbidities Age;Comorbidity 3+;Time since onset of injury/illness/exacerbation;Fitness    Comorbidities Osteopenia, chronic venous insufficiency,Hx of basal cell CA S/P lumpectomy, HTN    Examination-Activity Limitations Stand;Locomotion Level;Squat;Stairs;Carry;Lift    Stability/Clinical Decision Making Stable/Uncomplicated    Rehab Potential Fair    Clinical Impairments Affecting Rehab Potential Chronicity of condition, age, fitness level    PT Frequency 2x / week    PT Duration 8 weeks    PT Treatment/Interventions Aquatic Therapy;Electrical Stimulation;Iontophoresis 28m/ml Dexamethasone;Gait training;Stair training;Functional mobility training;Therapeutic activities;Therapeutic exercise;Balance training;Neuromuscular re-education;Patient/family education;Manual techniques;Dry needling   electrical stimulation  related treatment and ultrasound if appropriate   PT Next Visit Plan isometric plantar flexion, glute, trunk, scapular strengthening, femoral control, manual techniques, modalities PRN    Consulted and Agree with Plan of Care Patient             Patient will benefit from skilled therapeutic intervention in order to improve the following deficits and impairments:  Postural dysfunction, Decreased strength, Improper body mechanics, Pain, Difficulty  walking  Visit Diagnosis: Difficulty in walking, not elsewhere classified  Pain in left foot  Cervicalgia     Problem List Patient Active Problem List   Diagnosis Date Noted   Acute respiratory infection 10/06/2021   Pre-op evaluation 06/02/2021   Right wrist pain 02/28/2021   Skin rash 02/28/2021   Ascending aorta dilatation ([place]) 01/10/2021   Left-sided chest pain 01/10/2021   Genetic testing 10/30/2020   Malignant neoplasm of overlapping sites of left breast in female, estrogen receptor positive ([place]) 10/22/2020   Family history of breast cancer    Family history of thyroid cancer    Insertional Achilles tendinopathy 08/28/2020   Breast cancer, left breast ([place]) 08/01/2020   Right carotid bruit 06/05/2020   Chest pressure 06/03/2020   Left Achilles tendinitis 09/01/2019   Low back pain 08/29/2019   Lumbosacral spondylosis without myelopathy 08/29/2019   Calcaneal spur 08/17/2019   Contusion of knee 10/04/2018   Varicose veins of bilateral lower extremities with pain 03/30/2018   Lymphedema 03/30/2018   Dizziness 02/02/2017   Situational depression 12/18/2016   Vitamin D deficiency 10/11/2016   Fatigue 08/12/2016   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 04/14/2016   Essential hypertension 12/17/2015   Dermatitis of external ear 12/17/2015   Corn of foot 05/16/2015   Advanced care planning/counseling discussion 10/30/2014   Neck pain on right side 06/25/2014   Primary osteoarthritis of left knee 08/17/2013   Oropharyngeal dysphagia 11/15/2012   Stressful life events affecting family and household 03/30/2012   Medicare annual wellness visit, subsequent 03/30/2012   History of basal cell carcinoma    Osteoarthritis    Osteopenia    Hypothyroidism    Glaucoma    Chronic venous insufficiency     Joneen Boers PT, DPT   11/05/2021, 5:01 PM  [facility] [address] [place], [address] Phone: [phone]   Fax:  [phone]  Name: Beth Rodgers MRN: [id number] Date of Birth: 01-27-1935

## 2021-11-06 DIAGNOSIS — M1712 Unilateral primary osteoarthritis, left knee: Secondary | ICD-10-CM | POA: Diagnosis not present

## 2021-11-06 DIAGNOSIS — M25562 Pain in left knee: Secondary | ICD-10-CM | POA: Diagnosis not present

## 2021-11-07 ENCOUNTER — Ambulatory Visit: Payer: Medicare Other | Admitting: Oncology

## 2021-11-10 ENCOUNTER — Ambulatory Visit: Payer: Medicare Other

## 2021-11-10 ENCOUNTER — Telehealth: Payer: Self-pay | Admitting: Family Medicine

## 2021-11-10 DIAGNOSIS — R262 Difficulty in walking, not elsewhere classified: Secondary | ICD-10-CM

## 2021-11-10 DIAGNOSIS — M542 Cervicalgia: Secondary | ICD-10-CM

## 2021-11-10 DIAGNOSIS — M79672 Pain in left foot: Secondary | ICD-10-CM | POA: Diagnosis not present

## 2021-11-10 NOTE — Progress Notes (Signed)
  Chronic Care Management   Note  11/10/2021 Name: Beth Rodgers MRN: [id number] DOB: 1935/05/06  Beth Rodgers is a 86 y.o. year old female who is a primary care patient of Ria Bush, MD. I reached out to Beth Rodgers by phone today in response to a referral sent by Beth Rodgers PCP, Ria Bush, MD.   Beth Rodgers was given information about Chronic Care Management services today including:  CCM service includes personalized support from designated clinical staff supervised by her physician, including individualized plan of care and coordination with other care providers 24/7 contact phone numbers for assistance for urgent and routine care needs. Service will only be billed when office clinical staff spend 20 minutes or more in a month to coordinate care. Only one practitioner may furnish and bill the service in a calendar month. The patient may stop CCM services at any time (effective at the end of the month) by phone call to the office staff.   Patient agreed to services and verbal consent obtained.   Follow up plan:   Tatjana Secretary/administrator

## 2021-11-10 NOTE — Therapy (Signed)
[facility] [address], [place], [address] Phone: [phone]   Fax:  [phone]  Physical Therapy Treatment  Patient Details  Name: Beth Rodgers MRN: [id number] Date of Birth: [birth year] Referring Provider (PT): Owens Loffler, MD   Encounter Date: 11/10/2021   PT End of Session - 11/10/21 0807     Visit Number 16    Number of Visits 17    Date for PT Re-Evaluation 12/25/21    Authorization Type 6    Authorization Time Period 10    PT Start Time 0807    PT Stop Time 0840    PT Time Calculation (min) 33 min    Activity Tolerance Patient tolerated treatment well    Behavior During Therapy [facility] for tasks assessed/performed             Past Medical History:  Diagnosis Date   Basal cell carcinoma ([place])    txted with MOHs in past   Cellulitis 08/10/2018   Chronic venous insufficiency    with varicose veins   Family history of breast cancer    Family history of thyroid cancer    GERD (gastroesophageal reflux disease)    Glaucoma    History  of basal cell carcinoma    left nare/BREAST CANCER   History of chicken pox    Hypertension    Hypoglycemia    Hypothyroid    Osteoarthritis    back pain, L knee pain   Osteopenia 06/2009   DEXA 11/2015: T -2.3 hip, -2.2 spine, on longterm bisphosphonate/evista   Psoriasis    Pyogenic granuloma 04/16/2016    Past Surgical History:  Procedure Laterality Date   BASAL CELL CARCINOMA EXCISION     located on face   BREAST BIOPSY Left    core done ing [place]?   BREAST BIOPSY Left 08/16/2020   3 area bx 4:00 X, IMC, 6:00Q IMC, LN hydro #3-benign   BREAST LUMPECTOMY Left 09/11/2020   two areas of [facility]   CATARACT EXTRACTION     bilateral   DEXA  06/2009   T score Spine: -1.8, hip -2.0   EXCISION OF BREAST BIOPSY Left 09/11/2020   Procedure: EXCISION OF BREAST BIOPSY;  Surgeon: [name], MD;  Location: [facility] ORS;  Service: General;   Laterality: Left;   Foam sclerotherapy Bilateral 09/2015   [place], [place]   MASTECTOMY W/ SENTINEL NODE BIOPSY Left 06/25/2021   Procedure: MASTECTOMY WITH SENTINEL LYMPH NODE BIOPSY;  Surgeon: [name], MD;  Location: [facility] ORS;  Service: General;  Laterality: Left;   NASAL RECONSTRUCTION  2005   for [place] L nare s/p Mohs   nuclear stress test  2014   normal in [place] BX Left 09/11/2020   Procedure: PARTIAL MASTECTOMY WITH NEEDLE LOCALIZATION AND AXILLARY SENTINEL LYMPH NODE [place];  Surgeon: [name], MD;  Location: [facility] ORS;  Service: General;  Laterality: Left;   RADIOFREQUENCY ABLATION Left 01/2012   remnant L G saphenous vein below knee   RADIOFREQUENCY ABLATION Right 02/2013   RLE vein ablation    VEIN LIGATION AND STRIPPING  remote   bilateral G saphenous veins    There were no vitals filed for this visit.   Subjective Assessment - 11/10/21 0810     Subjective L heel is doing well today. Did quite a bit of exercise in the past 2 days. The push release exercise (  eccentric PF) has been very helpful. L heel has not woken her up.    Pertinent History Neck and achilles pain. L knee also bothers her. Had injection L knee which helped 10 days before her breast surgery June 25, 2021. Dr. [name] also states Pt needs surgery for her L knee (TKA). Pt however has a heel spur on L foot which is chronic. Went to 3 specialists for heel spur and was told that there is nothing can be done surgically. The heel pain however affects her gait which affects her L knee increasing L knee pain. Pt continues to do her HEP from previous PT. Neck pain began about 6 months ago gradually. Started feeling tightness from R medial shoulder blade to neck. Pt is R hand dominant. L foot bothers her more currently.    Patient Stated Goals Learn how to mitigate L heel pain.    Currently in Pain? Yes    Pain Score  2     Pain Onset More than a month ago                                        PT Education - 11/10/21 0809     Education Details ther-ex    Person(s) Educated Patient    Methods Explanation;Demonstration;Tactile cues;Verbal cues    Comprehension Returned demonstration;Verbalized understanding           Objective     No latex band allergies   B varicose veins legs Osteopenia     L heel pain Hx of L mastectomy on June 25, 2021 and had home health PT for recovery.      Started using a SPC 5 months ago when ambulating by herself. Furniture walk at home.    Medbridge Access Code [email or web address]     Manual therapy  Seated STM/cross friction technique L heel/Achilles area to promote blood flow as well as decrease fascial restrictions.    Seated STM L plantar foot to decrease fascial restrictions      Therapeutic exercise     Seated manually resisted L ankle PF isometrics 40% effort, PT resistance 1 min x 5    Seated manually resisted L ankle PF eccentric 10x3 with PT    Seated L hip abduction, foot in furniture slider, hips less than 90 degrees flexion 10x4 with PT manual resistance      Improved exercise technique, movement at target joints, use of target muscles after mod verbal, visual, tactile cues.        Response to treatment Pt tolerated session well without aggravation of L heel pain. Decreased L knee pain with gait as well after session.      Clinical Impression Decreased overall L heel pain with pain no longer waking pt up at night. Pt making very good progress with decreased L heel pain based on subjective reports. Continued working on improving soft tissue movement and gentle strengthening to promote healing as well as improve femoral control during gait to promote better mechanics at her foot. Pt tolerated session well without aggravation of symptoms. Pt will benefit from continued skilled physical therapy services to decrease  pain, improve strength and function.           PT Short Term Goals - 10/09/21 1422       PT SHORT TERM GOAL #1   Title Patient will be independent with her initial HEP to improve  strength, decrease neck and L heel pain, improve function.    Baseline Does her HEP, no questions (10/09/2021)    Time 3    Period Weeks    Status Achieved    Target Date 09/25/21               PT Long Term Goals - 10/29/21 1425       PT LONG TERM GOAL #1   Title Pt will have a decrease in L heel pain to 4/10 or less at worst to promote ability to ambulate, perform standing tasks more comfortably.    Baseline 8/10 L heel/Achilles pain at worst for the past 3 months (09/04/2021);  5/10 at worst (10/07/2021); 4/10 at most for the past 7 days, most of the time, 2/10, worst is getting up in the middle of the night to go to the bathroom (10/29/2021)    Time 8    Period Weeks    Status Achieved    Target Date 10/30/21      PT LONG TERM GOAL #2   Title Pt will improve bilateral hip extension and abduction strength by at least 1/2 MMT grade to promote femoral control and ability to ambulate and perform closed chain tasks with less L heel/Achilles pain.    Baseline Hip extension 4-/5 R, 3+/5 L, hip abduction 3-/5 R, 4-/5 L (09/04/2021); 4+/5 R, 4/5 L, hip abduction 4-/5 R, 4/5 L (L knee pain) 10/09/2021; Hip extension 4+/5 R, 4/5 L. Hip abduction in S/L  not tested secondary to L knee pain  (10/29/2021)    Time 8    Period Weeks    Status Partially Met    Target Date 12/25/21      PT LONG TERM GOAL #3   Title Pt will improve her L foot FOTO score by at least 10 points as a demonstration of improved function.    Baseline L foot FOTO 38 (09/04/2021); 43 (10/29/2021)    Time 8    Period Weeks    Status On-going    Target Date 12/25/21      PT LONG TERM GOAL #4   Title Pt will have a decrease in neck pain to 2/10 or less at worst to promote ability to look around more comfortably.    Baseline 5/10 neck pain  at worst for the past 3 months (09/04/2021), (10/07/2021), 1.5/10 at most for the past 7 day (10/29/2021)    Time 8    Period Weeks    Status Achieved    Target Date 10/30/21                   Plan - 11/10/21 0808     Clinical Impression Statement Decreased overall L heel pain with pain no longer waking pt up at night. Pt making very good progress with decreased L heel pain based on subjective reports. Continued working on improving soft tissue movement and gentle strengthening to promote healing as well as improve femoral control during gait to promote better mechanics at her foot. Pt tolerated session well without aggravation of symptoms. Pt will benefit from continued skilled physical therapy services to decrease pain, improve strength and function.    Personal Factors and Comorbidities Age;Comorbidity 3+;Time since onset of injury/illness/exacerbation;Fitness    Comorbidities Osteopenia, chronic venous insufficiency,Hx of basal cell CA S/P lumpectomy, HTN    Examination-Activity Limitations Stand;Locomotion Level;Squat;Stairs;Carry;Lift    Stability/Clinical Decision Making Stable/Uncomplicated    Clinical Decision Making Low    Rehab Potential Fair  Clinical Impairments Affecting Rehab Potential Chronicity of condition, age, fitness level    PT Frequency 2x / week    PT Duration 8 weeks    PT Treatment/Interventions Aquatic Therapy;Electrical Stimulation;Iontophoresis 4mg /ml Dexamethasone;Gait training;Stair training;Functional mobility training;Therapeutic activities;Therapeutic exercise;Balance training;Neuromuscular re-education;Patient/family education;Manual techniques;Dry needling   electrical stimulation related treatment and ultrasound if appropriate   PT Next Visit Plan isometric plantar flexion, glute, trunk, scapular strengthening, femoral control, manual techniques, modalities PRN    Consulted and Agree with Plan of Care Patient             Patient will benefit  from skilled therapeutic intervention in order to improve the following deficits and impairments:  Postural dysfunction, Decreased strength, Improper body mechanics, Pain, Difficulty walking  Visit Diagnosis: Difficulty in walking, not elsewhere classified  Pain in left foot  Cervicalgia     Problem List Patient Active Problem List   Diagnosis Date Noted   Acute respiratory infection 10/06/2021   Pre-op evaluation 06/02/2021   Right wrist pain 02/28/2021   Skin rash 02/28/2021   Ascending aorta dilatation ([place]) 01/10/2021   Left-sided chest pain 01/10/2021   Genetic testing 10/30/2020   Malignant neoplasm of overlapping sites of left breast in female, estrogen receptor positive ([place]) 10/22/2020   Family history of breast cancer    Family history of thyroid cancer    Insertional Achilles tendinopathy 08/28/2020   Breast cancer, left breast ([place]) 08/01/2020   Right carotid bruit 06/05/2020   Chest pressure 06/03/2020   Left Achilles tendinitis 09/01/2019   Low back pain 08/29/2019   Lumbosacral spondylosis without myelopathy 08/29/2019   Calcaneal spur 08/17/2019   Contusion of knee 10/04/2018   Varicose veins of bilateral lower extremities with pain 03/30/2018   Lymphedema 03/30/2018   Dizziness 02/02/2017   Situational depression 12/18/2016   Vitamin D deficiency 10/11/2016   Fatigue 08/12/2016   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 04/14/2016   Essential hypertension 12/17/2015   Dermatitis of external ear 12/17/2015   Corn of foot 05/16/2015   Advanced care planning/counseling discussion 10/30/2014   Neck pain on right side 06/25/2014   Primary osteoarthritis of left knee 08/17/2013   Oropharyngeal dysphagia 11/15/2012   Stressful life events affecting family and household 03/30/2012   Medicare annual wellness visit, subsequent 03/30/2012   History of basal cell carcinoma    Osteoarthritis    Osteopenia    Hypothyroidism    Glaucoma     Chronic venous insufficiency     Joneen Boers PT, DPT   11/10/2021, 6:25 PM  [facility] [address], [place], [address] Phone: [phone]   Fax:  [phone]  Name: ADAMARYS SHALL MRN: [id number] Date of Birth: [birth year]

## 2021-11-11 ENCOUNTER — Ambulatory Visit (INDEPENDENT_AMBULATORY_CARE_PROVIDER_SITE_OTHER): Payer: Medicare Other | Admitting: Psychology

## 2021-11-11 ENCOUNTER — Telehealth: Payer: Self-pay | Admitting: Family Medicine

## 2021-11-11 DIAGNOSIS — F4323 Adjustment disorder with mixed anxiety and depressed mood: Secondary | ICD-10-CM

## 2021-11-11 DIAGNOSIS — J22 Unspecified acute lower respiratory infection: Secondary | ICD-10-CM

## 2021-11-11 NOTE — Telephone Encounter (Addendum)
Spoke with pt asking about sxs.  States she has head and chest congestion.  No fever.  States she coughing more due to phlegm.  I relayed Dr. Synthia Innocent message.  Pt verbalizes understanding and will come to [place] on 11/13/21 for CXR. FYI to Dr. Darnell Level.

## 2021-11-11 NOTE — Telephone Encounter (Signed)
Pt called in stated she has completed her antibiotics and is still not feeling better she has congestion and runny nose .would like to know next steps .Please advise [phone]

## 2021-11-11 NOTE — Telephone Encounter (Signed)
Productive cough of white mucous for 6 wks, seen 10/10 treated with zpack, then seen 10/31 treated with cefdinir.   Plz triage patient - is she having more chest or head congestion? Any fever? How is cough?  If ongoing cough, next step is CXR - I have ordered. If CXR normal, will likely recommend prednisone taper.

## 2021-11-12 ENCOUNTER — Ambulatory Visit: Payer: Medicare Other

## 2021-11-13 DIAGNOSIS — M170 Bilateral primary osteoarthritis of knee: Secondary | ICD-10-CM | POA: Diagnosis not present

## 2021-11-14 ENCOUNTER — Ambulatory Visit (INDEPENDENT_AMBULATORY_CARE_PROVIDER_SITE_OTHER)
Admission: RE | Admit: 2021-11-14 | Discharge: 2021-11-14 | Disposition: A | Discharge status: Home / Self Care | Payer: Medicare Other | Source: Ambulatory Visit | Attending: Family Medicine | Admitting: Family Medicine

## 2021-11-14 DIAGNOSIS — J22 Unspecified acute lower respiratory infection: Secondary | ICD-10-CM | POA: Diagnosis not present

## 2021-11-14 DIAGNOSIS — R059 Cough, unspecified: Secondary | ICD-10-CM | POA: Diagnosis not present

## 2021-11-14 NOTE — Telephone Encounter (Signed)
Pt called stating that she missed her Xray appt and asking if she should come today. Please advise.

## 2021-11-14 NOTE — Telephone Encounter (Signed)
Per Jenny Reichmann, pt called again about CXR.  Asked her to let pt know she can come to [place] after 2:00 to get CXR.  No appt needed.

## 2021-11-17 ENCOUNTER — Ambulatory Visit: Payer: Medicare Other

## 2021-11-17 DIAGNOSIS — M542 Cervicalgia: Secondary | ICD-10-CM | POA: Diagnosis not present

## 2021-11-17 DIAGNOSIS — R262 Difficulty in walking, not elsewhere classified: Secondary | ICD-10-CM | POA: Diagnosis not present

## 2021-11-17 DIAGNOSIS — M79672 Pain in left foot: Secondary | ICD-10-CM

## 2021-11-17 NOTE — Therapy (Signed)
[facility] [address] [place], [address] Phone: [phone]   Fax:  [phone]  Physical Therapy Treatment  Patient Details  Name: Beth Rodgers MRN: [id number] Date of Birth: [birth year] Referring Provider (PT): Beth Loffler, MD   Encounter Date: 11/17/2021   PT End of Session - 11/17/21 1704     Visit Number 17    Number of Visits 17    Date for PT Re-Evaluation 12/25/21    Authorization Type Medicare    Progress Note Due on Visit 20    PT Start Time 1630    PT Stop Time 1710    PT Time Calculation (min) 40 min    Activity Tolerance Patient tolerated treatment well;No increased pain    Behavior During Therapy [facility] for tasks assessed/performed             Past Medical History:  Diagnosis Date   Basal cell carcinoma (BCC)    txted with MOHs in past   Cellulitis 08/10/2018   Chronic venous insufficiency    with varicose veins   Family history of breast cancer    Family history of thyroid cancer    GERD (gastroesophageal reflux disease)    Glaucoma    History  of basal cell carcinoma    left Rodgers/BREAST CANCER   History of chicken pox    Hypertension    Hypoglycemia    Hypothyroid    Osteoarthritis    back pain, L knee pain   Osteopenia 06/2009   DEXA 11/2015: T -2.3 hip, -2.2 spine, on longterm bisphosphonate/evista   Psoriasis    Pyogenic granuloma 04/16/2016    Past Surgical History:  Procedure Laterality Date   BASAL CELL CARCINOMA EXCISION     located on face   BREAST BIOPSY Left    core done ing [place]?   BREAST BIOPSY Left 08/16/2020   3 area bx 4:00 X, IMC, 6:00Q IMC, LN hydro #3-benign   BREAST LUMPECTOMY Left 09/11/2020   two areas of [facility]   CATARACT EXTRACTION     bilateral   DEXA  06/2009   T score Spine: -1.8, hip -2.0   EXCISION OF BREAST BIOPSY Left 09/11/2020   Procedure: EXCISION OF BREAST BIOPSY;  Surgeon: Beth Pun, MD;  Location: [facility]  ORS;  Service: General;  Laterality: Left;   Foam sclerotherapy Bilateral 09/2015   Beth Rodgers, [place]   MASTECTOMY W/ SENTINEL NODE BIOPSY Left 06/25/2021   Procedure: MASTECTOMY WITH SENTINEL LYMPH NODE BIOPSY;  Surgeon: Beth Pun, MD;  Location: [facility] ORS;  Service: General;  Laterality: Left;   NASAL RECONSTRUCTION  2005   for Beth Rodgers s/p Mohs   nuclear stress test  2014   normal in [place] BX Left 09/11/2020   Procedure: PARTIAL MASTECTOMY WITH NEEDLE LOCALIZATION AND AXILLARY SENTINEL LYMPH NODE [place];  Surgeon: Beth Pun, MD;  Location: [facility] ORS;  Service: General;  Laterality: Left;   RADIOFREQUENCY ABLATION Left 01/2012   remnant L G saphenous vein below knee   RADIOFREQUENCY ABLATION Right 02/2013   RLE vein ablation    VEIN LIGATION AND STRIPPING  remote   bilateral G saphenous veins    There were no vitals filed for this visit.   Subjective Assessment - 11/17/21 1637     Subjective Pt says she is doing well today. Still having pain at diffferent times, with some migration.  Pertinent History Neck and achilles pain. L knee also bothers her. Had injection L knee which helped 10 days before her breast surgery June 25, 2021. Dr. Edilia Rodgers also states Pt needs surgery for her L knee (TKA). Pt however has a heel spur on L foot which is chronic. Went to 3 specialists for heel spur and was told that there is nothing can be done surgically. The heel pain however affects her gait which affects her L knee increasing L knee pain. Pt continues to do her HEP from previous PT. Neck pain began about 6 months ago gradually. Started feeling tightness from R medial shoulder blade to neck. Pt is R hand dominant. L foot bothers her more currently.    Currently in Pain? No/denies   none when seated, but has pain ambiguously between knee and ankle, particularly in the morning.             Objective     No latex band allergies   B varicose veins legs Osteopenia  L heel pain Hx of L mastectomy on June 25, 2021 and had home health PT for recovery.    Started using a SPC 5 months ago when ambulating by herself. Furniture walks at home.    [employer] Access Code [email or web address]     Manual therapy -Seated STM/cross friction technique Lt heel/Achilles  -Seated STM L plantar foot to decrease fascial restrictions -Left ankle DF stretch 3x30sec P/ROM     Therapeutic exercise -Seated manually resisted L ankle PF isometrics 40% effort, PT resistance 1 min x 5  -Seated manually resisted L ankle PF eccentric 10x3 with PT -Seated L hip abduction, foot in furniture slider, hips less than 90 degrees flexion 10x4 with PT manual resistance    Improved exercise technique, movement at target joints, use of target muscles after mod verbal, visual, tactile cues.        PT Education - 11/17/21 1639     Education Details Self care at home    Person(s) Educated Patient    Methods Explanation;Demonstration    Comprehension Verbalized understanding              PT Short Term Goals - 10/09/21 1422       PT SHORT TERM GOAL #1   Title Patient will be independent with her initial HEP to improve strength, decrease neck and L heel pain, improve function.    Baseline Does her HEP, no questions (10/09/2021)    Time 3    Period Weeks    Status Achieved    Target Date 09/25/21               PT Long Term Goals - 10/29/21 1425       PT LONG TERM GOAL #1   Title Pt will have a decrease in L heel pain to 4/10 or less at worst to promote ability to ambulate, perform standing tasks more comfortably.    Baseline 8/10 L heel/Achilles pain at worst for the past 3 months (09/04/2021);  5/10 at worst (10/07/2021); 4/10 at most for the past 7 days, most of the time, 2/10, worst is getting up in the middle of the night to go to the bathroom (10/29/2021)    Time 8    Period Weeks     Status Achieved    Target Date 10/30/21      PT LONG TERM GOAL #2   Title Pt will improve bilateral hip extension and abduction strength by at least 1/2 MMT grade  to promote femoral control and ability to ambulate and perform closed chain tasks with less L heel/Achilles pain.    Baseline Hip extension 4-/5 R, 3+/5 L, hip abduction 3-/5 R, 4-/5 L (09/04/2021); 4+/5 R, 4/5 L, hip abduction 4-/5 R, 4/5 L (L knee pain) 10/09/2021; Hip extension 4+/5 R, 4/5 L. Hip abduction in S/L  not tested secondary to L knee pain  (10/29/2021)    Time 8    Period Weeks    Status Partially Met    Target Date 12/25/21      PT LONG TERM GOAL #3   Title Pt will improve her L foot FOTO score by at least 10 points as a demonstration of improved function.    Baseline L foot FOTO 38 (09/04/2021); 43 (10/29/2021)    Time 8    Period Weeks    Status On-going    Target Date 12/25/21      PT LONG TERM GOAL #4   Title Pt will have a decrease in neck pain to 2/10 or less at worst to promote ability to look around more comfortably.    Baseline 5/10 neck pain at worst for the past 3 months (09/04/2021), (10/07/2021), 1.5/10 at most for the past 7 day (10/29/2021)    Time 8    Period Weeks    Status Achieved    Target Date 10/30/21                   Plan - 11/17/21 1712     Clinical Impression Statement Continued working on improving soft tissue movement and gentle strengthening to promote healing as well as improve femoral control during gait to promote better mechanics at her foot. Pt tolerated session well without aggravation of symptoms. Difficult to tell to what degree but I suspect vascular disease, pitting edema, and baker's cyst are all complicating the heel process of this tissue. Pt will benefit from continued skilled physical therapy services to decrease pain, improve strength and function.    Personal Factors and Comorbidities Age;Comorbidity 3+;Time since onset of injury/illness/exacerbation;Fitness     Comorbidities Osteopenia, chronic venous insufficiency,Hx of basal cell CA S/P lumpectomy, HTN    Examination-Activity Limitations Stand;Locomotion Level;Squat;Stairs;Carry;Lift    Stability/Clinical Decision Making Stable/Uncomplicated    Clinical Decision Making Low    Rehab Potential Fair    Clinical Impairments Affecting Rehab Potential Chronicity of condition, age, fitness level    PT Frequency 2x / week    PT Duration 8 weeks    PT Treatment/Interventions Aquatic Therapy;Electrical Stimulation;Iontophoresis 27m/ml Dexamethasone;Gait training;Stair training;Functional mobility training;Therapeutic activities;Therapeutic exercise;Balance training;Neuromuscular re-education;Patient/family education;Manual techniques;Dry needling    PT Next Visit Plan isometric plantar flexion, glute, trunk, scapular strengthening, femoral control, manual techniques, modalities PRN    PT Home Exercise Plan no changes today    Consulted and Agree with Plan of Care Patient             Patient will benefit from skilled therapeutic intervention in order to improve the following deficits and impairments:  Postural dysfunction, Decreased strength, Improper body mechanics, Pain, Difficulty walking  Visit Diagnosis: Difficulty in walking, not elsewhere classified  Pain in left foot  Cervicalgia     Problem List Patient Active Problem List   Diagnosis Date Noted   Acute respiratory infection 10/06/2021   Pre-op evaluation 06/02/2021   Right wrist pain 02/28/2021   Skin rash 02/28/2021   Ascending aorta dilatation (HCC) 01/10/2021   Left-sided chest pain 01/10/2021   Genetic testing 10/30/2020  Malignant neoplasm of overlapping sites of left breast in female, estrogen receptor positive ([place]) 10/22/2020   Family history of breast cancer    Family history of thyroid cancer    Insertional Achilles tendinopathy 08/28/2020   Breast cancer, left breast ([place]) 08/01/2020   Right carotid bruit  06/05/2020   Chest pressure 06/03/2020   Left Achilles tendinitis 09/01/2019   Low back pain 08/29/2019   Lumbosacral spondylosis without myelopathy 08/29/2019   Calcaneal spur 08/17/2019   Contusion of knee 10/04/2018   Varicose veins of bilateral lower extremities with pain 03/30/2018   Lymphedema 03/30/2018   Dizziness 02/02/2017   Situational depression 12/18/2016   Vitamin D deficiency 10/11/2016   Fatigue 08/12/2016   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 04/14/2016   Essential hypertension 12/17/2015   Dermatitis of external ear 12/17/2015   Corn of foot 05/16/2015   Advanced care planning/counseling discussion 10/30/2014   Neck pain on right side 06/25/2014   Primary osteoarthritis of left knee 08/17/2013   Oropharyngeal dysphagia 11/15/2012   Stressful life events affecting family and household 03/30/2012   Medicare annual wellness visit, subsequent 03/30/2012   History of basal cell carcinoma    Osteoarthritis    Osteopenia    Hypothyroidism    Glaucoma    Chronic venous insufficiency    5:20 PM, 11/17/21 Etta [name], PT, DPT Physical Therapist - [place] [phone] (Office)   [place] C, PT 11/17/2021, 5:19 PM  [facility] [facility] [address] [place], [address] Phone: [phone]   Fax:  [phone]  Name: Beth Rodgers MRN: [id number] Date of Birth: 1935-02-28

## 2021-11-18 ENCOUNTER — Other Ambulatory Visit: Payer: Self-pay

## 2021-11-18 ENCOUNTER — Inpatient Hospital Stay: Payer: Medicare Other | Attending: Oncology | Admitting: Oncology

## 2021-11-18 ENCOUNTER — Ambulatory Visit: Payer: Medicare Other | Admitting: Oncology

## 2021-11-18 ENCOUNTER — Encounter: Payer: Self-pay | Admitting: Oncology

## 2021-11-18 VITALS — BP 126/64 | HR 81 | Temp 97.7°F | Resp 18 | Wt 173.0 lb

## 2021-11-18 DIAGNOSIS — Z170 Estrogen receptor positive status [ER+]: Secondary | ICD-10-CM

## 2021-11-18 DIAGNOSIS — M8580 Other specified disorders of bone density and structure, unspecified site: Secondary | ICD-10-CM | POA: Diagnosis not present

## 2021-11-18 DIAGNOSIS — Z79899 Other long term (current) drug therapy: Secondary | ICD-10-CM | POA: Diagnosis not present

## 2021-11-18 DIAGNOSIS — H401123 Primary open-angle glaucoma, left eye, severe stage: Secondary | ICD-10-CM | POA: Diagnosis not present

## 2021-11-18 DIAGNOSIS — C50812 Malignant neoplasm of overlapping sites of left female breast: Secondary | ICD-10-CM

## 2021-11-18 DIAGNOSIS — Z9012 Acquired absence of left breast and nipple: Secondary | ICD-10-CM | POA: Diagnosis not present

## 2021-11-18 NOTE — Addendum Note (Signed)
Addended by: Luella Cook on: 11/18/2021 01:30 PM   Modules accepted: Orders

## 2021-11-18 NOTE — Progress Notes (Signed)
Pt will like to discuss "fluid" coming from lymph nodes which is making the side of her abdomen "bulgy" not painful, but "does not look nice". Got a call today stating she will have knee surgery in the next couple of months.

## 2021-11-18 NOTE — Progress Notes (Signed)
Hematology/Oncology Consult note [facility]  [phone] [phone]  Patient Care Team: Ria Bush, MD as PCP - General (Family Medicine) Rico Junker, RN as Registered Nurse Charlton Haws, [facility] as Pharmacist (Pharmacist)   Name of the patient: Beth Rodgers  [id number]  [birth year]   Date of visit: 11/18/21  Diagnosis- pathological prognostic stage Ia invasive mammary carcinoma of the left breast pT1 cpN0 cM0 ER/PR positive HER2 negative  Chief complaint/ Reason for visit-routine follow-up of breast cancer s/p left mastectomy  Heme/Onc history:  Patient is a 86 year old female who self palpated a left breast mass in August 2021.  Diagnostic mammogram showed 2 adjacent masses at 4:00 and 6 o'clock position with interspersed calcifications in between spanning 4.5 cm.  Left axillary lymph nodes appeared normal.  Left breast biopsy in both 4 and 6 o'clock position showed invasive mammary carcinoma grade 1   Patient was seen by Dr. Peyton Najjar and underwent left lumpectomy with sentinel lymph node biopsy.  Final pathology showed multifocal invasive mammary carcinoma with high-grade DCIS.  1 sentinel lymph node negative for malignancy.  DCIS also noted in the left retroareolar excisional biopsy.  Tumor size 19 mm grade 1.  Lymphovascular invasion negative.  Margins were negative for invasive carcinoma but positive for DCIS posterior superior.  ER/PR positive and HER2 equivocal by IHC and negative by [place] group 5.  Given the concern for residual multifocal DCIS and positive margin for DCIS she was recommended to undergo mastectomy but chose to hold off on getting it done in September 2021.  She has now met with Dr. Peyton Najjar again and will be undergoing left mastectomy on 06/25/2021.  Oncotype DX testing was offered in September 2021 as well and patient declined.  Patient is not currently on endocrine therapy.   Patient underwentLeft mastectomy  by Dr. Peyton Najjar on 06/25/2021 due to concern for residual multifocal DCIS.  Final pathology showed DCIS present at the base of the nipple measuring 0.5 cm.  An isolated focus of DCIS is less than 1.1 cm adjacent to the retroareolar excision site.  Surgical margins uninvolved by DCIS.  Patient did not wish to pursue endocrine therapy    Interval history-patient reports doing well after mastectomy.  She feels that her left side of her body has more bulge as compared to the right.  Also has ongoing left knee pain and will be needing knee replacement surgery soon.  ECOG PS- 2 Pain scale- 4   Review of systems- Review of Systems  Constitutional:  Positive for malaise/fatigue. Negative for chills, fever and weight loss.  HENT:  Negative for congestion, ear discharge and nosebleeds.   Eyes:  Negative for blurred vision.  Respiratory:  Negative for cough, hemoptysis, sputum production, shortness of breath and wheezing.   Cardiovascular:  Negative for chest pain, palpitations, orthopnea and claudication.  Gastrointestinal:  Negative for abdominal pain, blood in stool, constipation, diarrhea, heartburn, melena, nausea and vomiting.  Genitourinary:  Negative for dysuria, flank pain, frequency, hematuria and urgency.  Musculoskeletal:  Positive for joint pain (Left knee pain). Negative for back pain and myalgias.  Skin:  Negative for rash.  Neurological:  Negative for dizziness, tingling, focal weakness, seizures, weakness and headaches.  Endo/Heme/Allergies:  Does not bruise/bleed easily.  Psychiatric/Behavioral:  Negative for depression and suicidal ideas. The patient does not have insomnia.      Allergies  Allergen Reactions   Anastrozole Other (See Comments)    Back pain  Exemestane     Pain in right hand    Naproxen Hives    Had bumps break out on the back      Past Medical History:  Diagnosis Date   Basal cell carcinoma (BCC)    txted with MOHs in past   Cellulitis 08/10/2018    Chronic venous insufficiency    with varicose veins   Family history of breast cancer    Family history of thyroid cancer    GERD (gastroesophageal reflux disease)    Glaucoma    History  of basal cell carcinoma    left nare/BREAST CANCER   History of chicken pox    Hypertension    Hypoglycemia    Hypothyroid    Osteoarthritis    back pain, L knee pain   Osteopenia 06/2009   DEXA 11/2015: T -2.3 hip, -2.2 spine, on longterm bisphosphonate/evista   Psoriasis    Pyogenic granuloma 04/16/2016     Past Surgical History:  Procedure Laterality Date   BASAL CELL CARCINOMA EXCISION     located on face   BREAST BIOPSY Left    core done ing [place]?   BREAST BIOPSY Left 08/16/2020   3 area bx 4:00 X, IMC, 6:00Q IMC, LN hydro #3-benign   BREAST LUMPECTOMY Left 09/11/2020   two areas of [facility]   CATARACT EXTRACTION     bilateral   DEXA  06/2009   T score Spine: -1.8, hip -2.0   EXCISION OF BREAST BIOPSY Left 09/11/2020   Procedure: EXCISION OF BREAST BIOPSY;  Surgeon: [name], MD;  Location: [facility] ORS;  Service: General;  Laterality: Left;   Foam sclerotherapy Bilateral 09/2015   [place], [place]   MASTECTOMY W/ SENTINEL NODE BIOPSY Left 06/25/2021   Procedure: MASTECTOMY WITH SENTINEL LYMPH NODE BIOPSY;  Surgeon: [name], MD;  Location: [facility] ORS;  Service: General;  Laterality: Left;   NASAL RECONSTRUCTION  2005   for [place] L nare s/p Mohs   nuclear stress test  2014   normal in [place] NODE BX Left 09/11/2020   Procedure: PARTIAL MASTECTOMY WITH NEEDLE LOCALIZATION AND AXILLARY SENTINEL LYMPH NODE [place];  Surgeon: [name], MD;  Location: [facility] ORS;  Service: General;  Laterality: Left;   RADIOFREQUENCY ABLATION Left 01/2012   remnant L G saphenous vein below knee   RADIOFREQUENCY ABLATION Right 02/2013   RLE vein ablation    VEIN LIGATION AND STRIPPING  remote    bilateral G saphenous veins    Social History   Socioeconomic History   Marital status: Married    Spouse name: Not on file   Number of children: 2   Years of education: Not on file   Highest education level: Not on file  Occupational History    Employer: RETIRED  Tobacco Use   Smoking status: Never   Smokeless tobacco: Never  Vaping Use   Vaping Use: Never used  Substance and Sexual Activity   Alcohol use: Yes    Comment: Occasionally drinks wine   Drug use: No   Sexual activity: Not Currently    Birth control/protection: Post-menopausal  Other Topics Concern   Not on file  Social History Narrative   From [place], [place]   Caffeine: 2-3 cups/day   Lives with husband, majority of time alone as he travels to [place].   Has grandchildren nearby.   Occ: Field seismologist, Metallurgist   Activity: bed exercises, limiting walking  Diet: does get fruits and vegetables, only some water   Social Determinants of Health   Financial Resource Strain: Low Risk    Difficulty of Paying Living Expenses: Not hard at all  Food Insecurity: No Food Insecurity   Worried About Programme researcher, broadcasting/film/video in the Last Year: Never true   Ran Out of Food in the Last Year: Never true  Transportation Needs: No Transportation Needs   Lack of Transportation (Medical): No   Lack of Transportation (Non-Medical): No  Physical Activity: Sufficiently Active   Days of Exercise per Week: 7 days   Minutes of Exercise per Session: 30 min  Stress: No Stress Concern Present   Feeling of Stress : Not at all  Social Connections: Not on file  Intimate Partner Violence: Not At Risk   Fear of Current or Ex-Partner: No   Emotionally Abused: No   Physically Abused: No   Sexually Abused: No    Family History  Problem Relation Age of Onset   Cancer Mother        thyroid cancer   Heart disease Father        CHF   Diabetes Father    Glaucoma Father    Arthritis Father    Coronary artery disease Maternal  Uncle    Bipolar disorder Son    Heart disease Sister        CHF   Stroke Sister    Brain cancer Grandson        astrocytoma   Breast cancer Cousin        dx 3s     Current Outpatient Medications:    alendronate (FOSAMAX) 70 MG tablet, Take 70 mg by mouth every Sunday., Disp: , Rfl:    benzonatate (TESSALON) 100 MG capsule, Take 1 capsule (100 mg total) by mouth 3 (three) times daily as needed for cough., Disp: 30 capsule, Rfl: 0   Calcium Carb-Cholecalciferol (CALCIUM 600 + D PO), Take 1 tablet by mouth daily., Disp: , Rfl:    calcium carbonate (TUMS EX) 750 MG chewable tablet, Chew 750 mg by mouth daily as needed for heartburn., Disp: , Rfl:    Cholecalciferol (VITAMIN D) 50 MCG (2000 UT) CAPS, Take 1 capsule (2,000 Units total) by mouth daily., Disp: 30 capsule, Rfl:    Cholecalciferol 50 MCG (2000 UT) CAPS, Take 1 capsule by mouth daily as needed., Disp: , Rfl:    clobetasol cream (TEMOVATE) 0.05 %, Apply 1 application topically as directed. Qd to bid up to 5 days a week to aa psoriasis on body until clear, then prn flares, avoid face, groin, axilla (Patient taking differently: Apply 1 application topically 2 (two) times daily.), Disp: 60 g, Rfl: 2   diclofenac sodium (VOLTAREN) 1 % GEL, Apply 1 application topically 3 (three) times daily. (Patient taking differently: Apply 1 application topically 3 (three) times daily as needed (pain).), Disp: 1 Tube, Rfl: 0   latanoprost (XALATAN) 0.005 % ophthalmic solution, latanoprost 0.005 % eye drops  PLACE 1 DROP INTO BOTH EYES ONCE EVERY EVENING, Disp: , Rfl:    levothyroxine (SYNTHROID) 75 MCG tablet, Take 1 tablet (75 mcg total) by mouth daily., Disp: 90 tablet, Rfl: 3   losartan (COZAAR) 100 MG tablet, TAKE 1 TABLET BY MOUTH EVERY DAY, Disp: 90 tablet, Rfl: 3   Magnesium 400 MG TABS, Take 400 mg by mouth daily., Disp: , Rfl:    Multiple Vitamin (MULTIVITAMIN) tablet, Take 1 tablet by mouth daily., Disp: , Rfl:  SELENIUM PO, Take by mouth  daily., Disp: , Rfl:    tacrolimus (PROTOPIC) 0.1 % ointment, APPLY TOPICALLY TO AFFECTED AREA(S) TWICE DAILY, Disp: 30 g, Rfl: 0   Travoprost, BAK Free, (TRAVATAN) 0.004 % SOLN ophthalmic solution, Place 1 drop into both eyes at bedtime., Disp: , Rfl:    vitamin C (ASCORBIC ACID) 500 MG tablet, Take 500 mg by mouth daily., Disp: , Rfl:    celecoxib (CELEBREX) 200 MG capsule, Take by mouth. (Patient not taking: Reported on 11/18/2021), Disp: , Rfl:    Ciclopirox 0.77 % gel, Apply 1 application topically 2 (two) times daily as needed (fungus). (Patient not taking: Reported on 11/18/2021), Disp: , Rfl:    NITROSTAT 0.4 MG SL tablet, Place 0.4 mg under the tongue every 5 (five) minutes as needed for chest pain.  (Patient not taking: Reported on 11/18/2021), Disp: , Rfl:    silver sulfADIAZINE (SILVADENE) 1 % cream, Apply 1 application topically daily. (Patient not taking: Reported on 11/18/2021), Disp: 50 g, Rfl: 2   vitamin E 180 MG (400 UNITS) capsule, Take 400 Units by mouth daily., Disp: , Rfl:   Physical exam:  Vitals:   11/18/21 1049  BP: 126/64  Pulse: 81  Resp: 18  Temp: 97.7 F (36.5 C)  SpO2: 100%  Weight: 173 lb (78.5 kg)   Physical Exam Constitutional:      General: She is not in acute distress.    Comments: Ambulates with a walker.  Appears in no acute distress  Cardiovascular:     Rate and Rhythm: Normal rate and regular rhythm.     Heart sounds: Normal heart sounds.  Pulmonary:     Effort: Pulmonary effort is normal.     Breath sounds: Normal breath sounds.  Abdominal:     General: Bowel sounds are normal.     Palpations: Abdomen is soft.  Skin:    General: Skin is warm and dry.  Neurological:     Mental Status: She is alert and oriented to person, place, and time.  Patient is s/p left mastectomy without reconstruction.  Well-healed surgical scar.  No palpable left axillary adenopathy.  No evidence of chest wall recurrence  CMP Latest Ref Rng & Units 06/02/2021   Glucose 70 - 99 mg/dL 82  BUN 6 - 23 mg/dL 19  Creatinine 1.03 - 3.54 mg/dL 1.00  Sodium 298 - 037 mEq/L 139  Potassium 3.5 - 5.1 mEq/L 4.3  Chloride 96 - 112 mEq/L 102  CO2 19 - 32 mEq/L 27  Calcium 8.4 - 10.5 mg/dL 9.8  Total Protein 6.5 - 8.1 g/dL -  Total Bilirubin 0.3 - 1.2 mg/dL -  Alkaline Phos 38 - 713 U/L -  AST 15 - 41 U/L -  ALT 0 - 44 U/L -   CBC Latest Ref Rng & Units 06/02/2021  WBC 4.0 - 10.5 K/uL 6.3  Hemoglobin 12.0 - 15.0 g/dL 18.1  Hematocrit 33.2 - 46.0 % 40.9  Platelets 150.0 - 400.0 K/uL 230.0    No images are attached to the encounter.  DG Chest 2 View  Result Date: 11/15/2021 CLINICAL DATA:  Persistent cough for 6 weeks despite antibiotics EXAM: CHEST - 2 VIEW COMPARISON:  06/02/2021 FINDINGS: Normal heart size and vascularity. Minor nonspecific vascular and interstitial prominence. Similar right upper lobe scarring. No effusion or pneumothorax. Trachea midline. Aorta atherosclerotic and ectatic. Degenerative changes of the spine with slight increased thoracic kyphosis. IMPRESSION: Stable exam without superimposed acute chest process. Electronically Signed  By: Eugenie Filler M.D.   On: 11/15/2021 11:19     Assessment and plan- Patient is a 86 y.o. female with stage Ia invasive mammary carcinoma of the left breast pT1 cN0 M0 ER/PR positive HER2 negative status postlumpectomy and sentinel lymph node biopsy.  Patient underwent left mastectomy without reconstruction for concerns of residual DCIS.  She is not currently on any endocrine therapy and here for routine follow-up  Clinically patient is doing well and healing well from her mastectomy.  Her last mammogram in the [facility] system was in August 2021Although she did have a mammogram at [facility] in November 2022.  She would therefore need a routine right breast screening mammogram in November 2022.  I will see her back in 4 months no labs   Visit Diagnosis 1. Malignant neoplasm of overlapping sites of left breast in  female, estrogen receptor positive ([place])      Dr. Randa Evens, MD, MPH [facility] [phone] 11/18/2021 12:34 PM

## 2021-11-19 ENCOUNTER — Ambulatory Visit: Payer: Medicare Other

## 2021-11-19 ENCOUNTER — Telehealth: Payer: Self-pay | Admitting: Family Medicine

## 2021-11-19 ENCOUNTER — Telehealth: Payer: Self-pay

## 2021-11-19 DIAGNOSIS — M542 Cervicalgia: Secondary | ICD-10-CM | POA: Diagnosis not present

## 2021-11-19 DIAGNOSIS — R262 Difficulty in walking, not elsewhere classified: Secondary | ICD-10-CM

## 2021-11-19 DIAGNOSIS — M79672 Pain in left foot: Secondary | ICD-10-CM | POA: Diagnosis not present

## 2021-11-19 NOTE — Telephone Encounter (Signed)
See result note.  CXR returned without signs of pneumonia.  Agree with continue nasal spray (I assume flonase?) Ok to receive flu shot if no fever and symptoms are improving.

## 2021-11-19 NOTE — Telephone Encounter (Signed)
Pt has called the office wanting to speak with Lattie Haw and discuss her most recent labs. Also wanted to update her on how she is feeling health wise.  Please return phone call.

## 2021-11-19 NOTE — Telephone Encounter (Addendum)
Received faxed pre-op form from [facility].  Pt scheduled on 03/09/2022 for left TKA by Dr. Gaynelle Arabian.  Plz schedule pre-op exam.   [Form is in basket on Lisa's desk.]

## 2021-11-19 NOTE — Therapy (Signed)
[facility] [address], [place], [address] Phone: [phone]   Fax:  [phone]  Physical Therapy Treatment  Patient Details  Name: Beth Rodgers MRN: [id number] Date of Birth: [birth year] Referring Provider (PT): Owens Loffler, MD   Encounter Date: 11/19/2021   PT End of Session - 11/19/21 1542     Visit Number 18    Number of Visits 33    Date for PT Re-Evaluation 12/25/21    Authorization Type Medicare    Progress Note Due on Visit 20    PT Start Time 1542    PT Stop Time 1630    PT Time Calculation (min) 48 min    Activity Tolerance Patient tolerated treatment well;No increased pain    Behavior During Therapy [facility] for tasks assessed/performed             Past Medical History:  Diagnosis Date   Basal cell carcinoma (BCC)    txted with MOHs in past   Cellulitis 08/10/2018   Chronic venous insufficiency    with varicose veins   Family history of breast cancer    Family history of thyroid cancer    GERD (gastroesophageal reflux disease)    Glaucoma    History  of basal cell carcinoma    left nare/BREAST CANCER   History of chicken pox    Hypertension    Hypoglycemia    Hypothyroid    Osteoarthritis    back pain, L knee pain   Osteopenia 06/2009   DEXA 11/2015: T -2.3 hip, -2.2 spine, on longterm bisphosphonate/evista   Psoriasis    Pyogenic granuloma 04/16/2016    Past Surgical History:  Procedure Laterality Date   BASAL CELL CARCINOMA EXCISION     located on face   BREAST BIOPSY Left    core done ing [place]?   BREAST BIOPSY Left 08/16/2020   3 area bx 4:00 X, IMC, 6:00Q IMC, LN hydro #3-benign   BREAST LUMPECTOMY Left 09/11/2020   two areas of [facility]   CATARACT EXTRACTION     bilateral   DEXA  06/2009   T score Spine: -1.8, hip -2.0   EXCISION OF BREAST BIOPSY Left 09/11/2020   Procedure: EXCISION OF BREAST BIOPSY;  Surgeon: [name], MD;  Location: [facility]  ORS;  Service: General;  Laterality: Left;   Foam sclerotherapy Bilateral 09/2015   Reed Pandy, [place]   MASTECTOMY W/ SENTINEL NODE BIOPSY Left 06/25/2021   Procedure: MASTECTOMY WITH SENTINEL LYMPH NODE BIOPSY;  Surgeon: [name], MD;  Location: [facility] ORS;  Service: General;  Laterality: Left;   NASAL RECONSTRUCTION  2005   for [place] L nare s/p Mohs   nuclear stress test  2014   normal in [place] BX Left 09/11/2020   Procedure: PARTIAL MASTECTOMY WITH NEEDLE LOCALIZATION AND AXILLARY SENTINEL LYMPH NODE [place];  Surgeon: [name], MD;  Location: [facility] ORS;  Service: General;  Laterality: Left;   RADIOFREQUENCY ABLATION Left 01/2012   remnant L G saphenous vein below knee   RADIOFREQUENCY ABLATION Right 02/2013   RLE vein ablation    VEIN LIGATION AND STRIPPING  remote   bilateral G saphenous veins    There were no vitals filed for this visit.   Subjective Assessment - 11/19/21 1543     Subjective The L foot is better. The L knee is not. Has L TKA March 09, 2022. Just  minor L heel pain. Taking celebrex for L knee. 5/10 L knee pain. Has been very impaired by both the L heel and knee.    Pertinent History Neck and achilles pain. L knee also bothers her. Had injection L knee which helped 10 days before her breast surgery June 25, 2021. Dr. [name] also states Pt needs surgery for her L knee (TKA). Pt however has a heel spur on L foot which is chronic. Went to 3 specialists for heel spur and was told that there is nothing can be done surgically. The heel pain however affects her gait which affects her L knee increasing L knee pain. Pt continues to do her HEP from previous PT. Neck pain began about 6 months ago gradually. Started feeling tightness from R medial shoulder blade to neck. Pt is R hand dominant. L foot bothers her more currently.    Currently in Pain? Other (Comment)   Minor L heel  pain, mainly L knee pain                                       PT Education - 11/19/21 1625     Education Details ther-ex    Person(s) Educated Patient    Methods Explanation;Demonstration;Tactile cues;Verbal cues    Comprehension Returned demonstration;Verbalized understanding            Objective     No latex band allergies   B varicose veins legs Osteopenia     L heel pain Hx of L mastectomy on June 25, 2021 and had home health PT for recovery.      Started using a SPC 5 months ago when ambulating by herself. Furniture walk at home.    Med[employer] Access Code [email or web address]     Manual therapy  Seated STM L lateral hamstrings to decrease tension and improve L knee and ankle mechanics.   Supine medial glide L leg grade 1 for pain control of L knee to promote better L foot and heel mechanics during gait.        Therapeutic exercise  Hooklying manually reisted hip abduction isometrics, leg straight   L 10x3 with 5 second holds   Hooklying hip extension leg straight to promote glute max strength  L 10x3 with 5 second holds   Seated hip ER 10x2 with 5 second holds  Seated hip adduction isometrics, folded pillow 10x5 seconds for 2 sets      Improved exercise technique, movement at target joints, use of target muscles after mod verbal, visual, tactile cues.        Response to treatment Pt tolerated session well without aggravation of L heel pain. Decreased L knee pain with gait as well after session.      Clinical Impression Worked on improving glute strength as well as decreasing lateral hamstrings muscle tension to promote better mechanics at her L knee and foot during gait. Pt states L knee feeling better with gait after session. No complain of L foot pain. Pt will benefit from continued skilled physical therapy services to decrease pain, improve strength and function.       PT Short Term Goals - 10/09/21 1422       PT SHORT  TERM GOAL #1   Title Patient will be independent with her initial HEP to improve strength, decrease neck and L heel pain, improve function.    Baseline Does her HEP, no questions (  10/09/2021)    Time 3    Period Weeks    Status Achieved    Target Date 09/25/21               PT Long Term Goals - 10/29/21 1425       PT LONG TERM GOAL #1   Title Pt will have a decrease in L heel pain to 4/10 or less at worst to promote ability to ambulate, perform standing tasks more comfortably.    Baseline 8/10 L heel/Achilles pain at worst for the past 3 months (09/04/2021);  5/10 at worst (10/07/2021); 4/10 at most for the past 7 days, most of the time, 2/10, worst is getting up in the middle of the night to go to the bathroom (10/29/2021)    Time 8    Period Weeks    Status Achieved    Target Date 10/30/21      PT LONG TERM GOAL #2   Title Pt will improve bilateral hip extension and abduction strength by at least 1/2 MMT grade to promote femoral control and ability to ambulate and perform closed chain tasks with less L heel/Achilles pain.    Baseline Hip extension 4-/5 R, 3+/5 L, hip abduction 3-/5 R, 4-/5 L (09/04/2021); 4+/5 R, 4/5 L, hip abduction 4-/5 R, 4/5 L (L knee pain) 10/09/2021; Hip extension 4+/5 R, 4/5 L. Hip abduction in S/L  not tested secondary to L knee pain  (10/29/2021)    Time 8    Period Weeks    Status Partially Met    Target Date 12/25/21      PT LONG TERM GOAL #3   Title Pt will improve her L foot FOTO score by at least 10 points as a demonstration of improved function.    Baseline L foot FOTO 38 (09/04/2021); 43 (10/29/2021)    Time 8    Period Weeks    Status On-going    Target Date 12/25/21      PT LONG TERM GOAL #4   Title Pt will have a decrease in neck pain to 2/10 or less at worst to promote ability to look around more comfortably.    Baseline 5/10 neck pain at worst for the past 3 months (09/04/2021), (10/07/2021), 1.5/10 at most for the past 7 day (10/29/2021)     Time 8    Period Weeks    Status Achieved    Target Date 10/30/21                   Plan - 11/19/21 1539     Clinical Impression Statement Worked on improving glute strength as well as decreasing lateral hamstrings muscle tension to promote better mechanics at her L knee and foot during gait. Pt states L knee feeling better with gait after session. No complain of L foot pain. Pt will benefit from continued skilled physical therapy services to decrease pain, improve strength and function.    Personal Factors and Comorbidities Age;Comorbidity 3+;Time since onset of injury/illness/exacerbation;Fitness    Comorbidities Osteopenia, chronic venous insufficiency,Hx of basal cell CA S/P lumpectomy, HTN    Examination-Activity Limitations Stand;Locomotion Level;Squat;Stairs;Carry;Lift    Stability/Clinical Decision Making Stable/Uncomplicated    Clinical Decision Making Low    Rehab Potential Fair    Clinical Impairments Affecting Rehab Potential Chronicity of condition, age, fitness level    PT Frequency 2x / week    PT Duration 8 weeks    PT Treatment/Interventions Aquatic Therapy;Electrical Stimulation;Iontophoresis 4mg /ml Dexamethasone;Gait training;Stair training;Functional  mobility training;Therapeutic activities;Therapeutic exercise;Balance training;Neuromuscular re-education;Patient/family education;Manual techniques;Dry needling    PT Next Visit Plan isometric plantar flexion, glute, trunk, scapular strengthening, femoral control, manual techniques, modalities PRN    PT Home Exercise Plan no changes today    Consulted and Agree with Plan of Care Patient             Patient will benefit from skilled therapeutic intervention in order to improve the following deficits and impairments:  Postural dysfunction, Decreased strength, Improper body mechanics, Pain, Difficulty walking  Visit Diagnosis: Difficulty in walking, not elsewhere classified  Pain in left foot     Problem  List Patient Active Problem List   Diagnosis Date Noted   Acute respiratory infection 10/06/2021   Pre-op evaluation 06/02/2021   Right wrist pain 02/28/2021   Skin rash 02/28/2021   Ascending aorta dilatation (HCC) 01/10/2021   Left-sided chest pain 01/10/2021   Genetic testing 10/30/2020   Malignant neoplasm of overlapping sites of left breast in female, estrogen receptor positive ([place]) 10/22/2020   Family history of breast cancer    Family history of thyroid cancer    Insertional Achilles tendinopathy 08/28/2020   Breast cancer, left breast ([place]) 08/01/2020   Right carotid bruit 06/05/2020   Chest pressure 06/03/2020   Left Achilles tendinitis 09/01/2019   Low back pain 08/29/2019   Lumbosacral spondylosis without myelopathy 08/29/2019   Calcaneal spur 08/17/2019   Contusion of knee 10/04/2018   Varicose veins of bilateral lower extremities with pain 03/30/2018   Lymphedema 03/30/2018   Dizziness 02/02/2017   Situational depression 12/18/2016   Vitamin D deficiency 10/11/2016   Fatigue 08/12/2016   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 04/14/2016   Essential hypertension 12/17/2015   Dermatitis of external ear 12/17/2015   Corn of foot 05/16/2015   Advanced care planning/counseling discussion 10/30/2014   Neck pain on right side 06/25/2014   Primary osteoarthritis of left knee 08/17/2013   Oropharyngeal dysphagia 11/15/2012   Stressful life events affecting family and household 03/30/2012   Medicare annual wellness visit, subsequent 03/30/2012   History of basal cell carcinoma    Osteoarthritis    Osteopenia    Hypothyroidism    Glaucoma    Chronic venous insufficiency     Joneen Boers PT, DPT  11/19/2021, 5:07 PM   [facility] [address], [place], [address] Phone: [phone]   Fax:  [phone]  Name: Beth Rodgers MRN: [id number] Date of Birth:  Jan 10, 1935

## 2021-11-19 NOTE — Telephone Encounter (Signed)
Spoke with pt relaying Dr. G's message.  Pt verbalizes understanding and expresses her thanks.  

## 2021-11-19 NOTE — Telephone Encounter (Signed)
Spoke with pt informing her Dr. Darnell Level hasn't received and reviewed results yet.  But we will call her with the results.   Also, pt wants to make Dr. Darnell Level aware she now has nasal congestion but is using nasal spray which seems to be helping.  Pt is asking with the sxs she is having, can she get flu shot now or should she wait.  Plz advise.

## 2021-11-24 NOTE — Progress Notes (Signed)
Noted! Thank you

## 2021-11-24 NOTE — Telephone Encounter (Signed)
Noted  

## 2021-11-24 NOTE — Telephone Encounter (Signed)
Called Beth Rodgers and got her scheduled for 2/10 @1130 

## 2021-11-25 ENCOUNTER — Inpatient Hospital Stay
Admission: RE | Admit: 2021-11-25 | Discharge: 2021-11-25 | Disposition: A | Discharge status: Home / Self Care | Payer: Self-pay | Source: Ambulatory Visit | Attending: *Deleted | Admitting: *Deleted

## 2021-11-25 ENCOUNTER — Other Ambulatory Visit: Payer: Self-pay | Admitting: *Deleted

## 2021-11-25 ENCOUNTER — Ambulatory Visit: Payer: Medicare Other

## 2021-11-25 ENCOUNTER — Ambulatory Visit (INDEPENDENT_AMBULATORY_CARE_PROVIDER_SITE_OTHER): Payer: Medicare Other | Admitting: Psychology

## 2021-11-25 DIAGNOSIS — Z1231 Encounter for screening mammogram for malignant neoplasm of breast: Secondary | ICD-10-CM

## 2021-11-25 DIAGNOSIS — F4323 Adjustment disorder with mixed anxiety and depressed mood: Secondary | ICD-10-CM

## 2021-11-27 ENCOUNTER — Ambulatory Visit
Admission: RE | Admit: 2021-11-27 | Discharge: 2021-11-27 | Disposition: A | Discharge status: Home / Self Care | Payer: Self-pay | Source: Ambulatory Visit | Attending: *Deleted | Admitting: *Deleted

## 2021-11-27 ENCOUNTER — Ambulatory Visit: Payer: Medicare Other | Attending: Family Medicine

## 2021-11-27 ENCOUNTER — Other Ambulatory Visit: Payer: Self-pay

## 2021-11-27 ENCOUNTER — Other Ambulatory Visit: Payer: Self-pay | Admitting: *Deleted

## 2021-11-27 DIAGNOSIS — Z1231 Encounter for screening mammogram for malignant neoplasm of breast: Secondary | ICD-10-CM

## 2021-11-27 DIAGNOSIS — R262 Difficulty in walking, not elsewhere classified: Secondary | ICD-10-CM | POA: Diagnosis not present

## 2021-11-27 DIAGNOSIS — M542 Cervicalgia: Secondary | ICD-10-CM | POA: Diagnosis not present

## 2021-11-27 DIAGNOSIS — M79672 Pain in left foot: Secondary | ICD-10-CM | POA: Insufficient documentation

## 2021-11-27 NOTE — Therapy (Signed)
[facility] [address], [place], [address] Phone: [phone]   Fax:  [phone]  Physical Therapy Treatment  Patient Details  Name: Beth Rodgers MRN: [id number] Date of Birth: [birth year] Referring Provider (PT): Owens Loffler, MD   Encounter Date: 11/27/2021   PT End of Session - 11/27/21 1559     Visit Number 19    Number of Visits 33    Date for PT Re-Evaluation 12/25/21    Authorization Type Medicare    Progress Note Due on Visit 20    PT Start Time 1501    PT Stop Time 1546    PT Time Calculation (min) 45 min    Activity Tolerance Patient tolerated treatment well;No increased pain    Behavior During Therapy [facility] for tasks assessed/performed             Past Medical History:  Diagnosis Date   Basal cell carcinoma (BCC)    txted with MOHs in past   Cellulitis 08/10/2018   Chronic venous insufficiency    with varicose veins   Family history of breast cancer    Family history of thyroid cancer    GERD (gastroesophageal reflux disease)    Glaucoma    History  of basal cell carcinoma    left nare/BREAST CANCER   History of chicken pox    Hypertension    Hypoglycemia    Hypothyroid    Osteoarthritis    back pain, L knee pain   Osteopenia 06/2009   DEXA 11/2015: T -2.3 hip, -2.2 spine, on longterm bisphosphonate/evista   Psoriasis    Pyogenic granuloma 04/16/2016    Past Surgical History:  Procedure Laterality Date   BASAL CELL CARCINOMA EXCISION     located on face   BREAST BIOPSY Left    core done ing [place]?   BREAST BIOPSY Left 08/16/2020   3 area bx 4:00 X, IMC, 6:00Q IMC, LN hydro #3-benign   BREAST LUMPECTOMY Left 09/11/2020   two areas of [facility]   CATARACT EXTRACTION     bilateral   DEXA  06/2009   T score Spine: -1.8, hip -2.0   EXCISION OF BREAST BIOPSY Left 09/11/2020   Procedure: EXCISION OF BREAST BIOPSY;  Surgeon: [name], MD;  Location: [facility] ORS;   Service: General;  Laterality: Left;   Foam sclerotherapy Bilateral 09/2015   Reed [place], [place]   MASTECTOMY W/ SENTINEL NODE BIOPSY Left 06/25/2021   Procedure: MASTECTOMY WITH SENTINEL LYMPH NODE BIOPSY;  Surgeon: [name], MD;  Location: [facility] ORS;  Service: General;  Laterality: Left;   NASAL RECONSTRUCTION  2005   for [place] L nare s/p Mohs   nuclear stress test  2014   normal in [place] BX Left 09/11/2020   Procedure: PARTIAL MASTECTOMY WITH NEEDLE LOCALIZATION AND AXILLARY SENTINEL LYMPH NODE [place];  Surgeon: [name], MD;  Location: [facility] ORS;  Service: General;  Laterality: Left;   RADIOFREQUENCY ABLATION Left 01/2012   remnant L G saphenous vein below knee   RADIOFREQUENCY ABLATION Right 02/2013   RLE vein ablation    VEIN LIGATION AND STRIPPING  remote   bilateral G saphenous veins    There were no vitals filed for this visit.   Subjective Assessment - 11/27/21 1505     Subjective Reports L foot pain currently 2/10. Woke her up last night at 4/10 NPS. Reports MD wants  her to focus on L knee strengthening for future L TKA.    Pertinent History Neck and achilles pain. L knee also bothers her. Had injection L knee which helped 10 days before her breast surgery June 25, 2021. Dr. [name] also states Pt needs surgery for her L knee (TKA). Pt however has a heel spur on L foot which is chronic. Went to 3 specialists for heel spur and was told that there is nothing can be done surgically. The heel pain however affects her gait which affects her L knee increasing L knee pain. Pt continues to do her HEP from previous PT. Neck pain began about 6 months ago gradually. Started feeling tightness from R medial shoulder blade to neck. Pt is R hand dominant. L foot bothers her more currently.    Currently in Pain? Yes    Pain Score 2     Pain Location Heel             There.ex:  Seated  L isometric plantarflexion: 10x10 sec holds   Hook lying L quad set: x10, 5 sec holds   Hook lying LLE SLR: 2x10  Hook lying bridge: 3x6. Pt rates as challenging  Seated heel raises: 2x20/LE    Standing exercises: SBA provided for safety  SLS on LLE and BUE support: 3x15 sec  Standing hip abduction with BUE support: 2x10/LE. Cuing for upright posture.      Manual Therapy:   Pt seated in chair, Cross friction massage to L achilles tendon for 8 min for pain modulation   Gastroc stretch on LLE PROM: 3x30 sec    PT Education - 11/27/21 1507     Education Details form/technique with exercise.    Person(s) Educated Patient    Methods Explanation;Demonstration;Tactile cues;Verbal cues    Comprehension Verbalized understanding;Returned demonstration              PT Short Term Goals - 10/09/21 1422       PT SHORT TERM GOAL #1   Title Patient will be independent with her initial HEP to improve strength, decrease neck and L heel pain, improve function.    Baseline Does her HEP, no questions (10/09/2021)    Time 3    Period Weeks    Status Achieved    Target Date 09/25/21               PT Long Term Goals - 10/29/21 1425       PT LONG TERM GOAL #1   Title Pt will have a decrease in L heel pain to 4/10 or less at worst to promote ability to ambulate, perform standing tasks more comfortably.    Baseline 8/10 L heel/Achilles pain at worst for the past 3 months (09/04/2021);  5/10 at worst (10/07/2021); 4/10 at most for the past 7 days, most of the time, 2/10, worst is getting up in the middle of the night to go to the bathroom (10/29/2021)    Time 8    Period Weeks    Status Achieved    Target Date 10/30/21      PT LONG TERM GOAL #2   Title Pt will improve bilateral hip extension and abduction strength by at least 1/2 MMT grade to promote femoral control and ability to ambulate and perform closed chain tasks with less L heel/Achilles pain.    Baseline Hip extension 4-/5 R,  3+/5 L, hip abduction 3-/5 R, 4-/5 L (09/04/2021); 4+/5 R, 4/5 L, hip abduction 4-/5 R, 4/5  L (L knee pain) 10/09/2021; Hip extension 4+/5 R, 4/5 L. Hip abduction in S/L  not tested secondary to L knee pain  (10/29/2021)    Time 8    Period Weeks    Status Partially Met    Target Date 12/25/21      PT LONG TERM GOAL #3   Title Pt will improve her L foot FOTO score by at least 10 points as a demonstration of improved function.    Baseline L foot FOTO 38 (09/04/2021); 43 (10/29/2021)    Time 8    Period Weeks    Status On-going    Target Date 12/25/21      PT LONG TERM GOAL #4   Title Pt will have a decrease in neck pain to 2/10 or less at worst to promote ability to look around more comfortably.    Baseline 5/10 neck pain at worst for the past 3 months (09/04/2021), (10/07/2021), 1.5/10 at most for the past 7 day (10/29/2021)    Time 8    Period Weeks    Status Achieved    Target Date 10/30/21                   Plan - 11/27/21 1559     Clinical Impression Statement Progressed pt POC with stnading exercises. Pt reports minor pain with standing exercises on LLE at achilles but improve after exercise. Post session pt reports decrease in her pain and remains motivated to attempt new exercises at home. Pt tolerated the addition of L hip and knee exercises to assist in LLE stability to improve gait/stnading mechanics for L heel pain. Pt will continue to benefit from skilled PT services to further improve pain and strength to optimize function for ADL completion.    Personal Factors and Comorbidities Age;Comorbidity 3+;Time since onset of injury/illness/exacerbation;Fitness    Comorbidities Osteopenia, chronic venous insufficiency,Hx of basal cell CA S/P lumpectomy, HTN    Examination-Activity Limitations Stand;Locomotion Level;Squat;Stairs;Carry;Lift    Stability/Clinical Decision Making Stable/Uncomplicated    Rehab Potential Fair    Clinical Impairments Affecting Rehab Potential Chronicity  of condition, age, fitness level    PT Frequency 2x / week    PT Duration 8 weeks    PT Treatment/Interventions Aquatic Therapy;Electrical Stimulation;Iontophoresis 79m/ml Dexamethasone;Gait training;Stair training;Functional mobility training;Therapeutic activities;Therapeutic exercise;Balance training;Neuromuscular re-education;Patient/family education;Manual techniques;Dry needling    PT Next Visit Plan isometric plantar flexion, glute, trunk, scapular strengthening, femoral control, manual techniques, modalities PRN    PT Home Exercise Plan no changes today    Consulted and Agree with Plan of Care Patient             Patient will benefit from skilled therapeutic intervention in order to improve the following deficits and impairments:  Postural dysfunction, Decreased strength, Improper body mechanics, Pain, Difficulty walking  Visit Diagnosis: Difficulty in walking, not elsewhere classified  Pain in left foot     Problem List Patient Active Problem List   Diagnosis Date Noted   Acute respiratory infection 10/06/2021   Pre-op evaluation 06/02/2021   Right wrist pain 02/28/2021   Skin rash 02/28/2021   Ascending aorta dilatation (HCC) 01/10/2021   Left-sided chest pain 01/10/2021   Genetic testing 10/30/2020   Malignant neoplasm of overlapping sites of left breast in female, estrogen receptor positive [place] 10/22/2020   Family history of breast cancer    Family history of thyroid cancer    Insertional Achilles tendinopathy 08/28/2020   [place]  Right carotid bruit 06/05/2020   Chest pressure 06/03/2020   Left Achilles tendinitis 09/01/2019   Low back pain 08/29/2019   Lumbosacral spondylosis without myelopathy 08/29/2019   Calcaneal spur 08/17/2019   Contusion of knee 10/04/2018   Varicose veins of bilateral lower extremities with pain 03/30/2018   Lymphedema 03/30/2018   Dizziness 02/02/2017   Situational depression 12/18/2016    Vitamin D deficiency 10/11/2016   Fatigue 08/12/2016   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 Fairly IV, PT, DPT Physical Therapist- [facility]  11/27/2021, 4:14 PM  [place] [facility] [address], [place], [address] Phone: [phone]   Fax:  [phone]  Name: Beth Rodgers MRN: [id number] Date of Birth: [birth year]

## 2021-11-28 ENCOUNTER — Other Ambulatory Visit: Payer: Self-pay | Admitting: Oncology

## 2021-11-28 DIAGNOSIS — Z1231 Encounter for screening mammogram for malignant neoplasm of breast: Secondary | ICD-10-CM

## 2021-12-02 ENCOUNTER — Ambulatory Visit: Payer: Medicare Other

## 2021-12-02 ENCOUNTER — Other Ambulatory Visit: Payer: Self-pay

## 2021-12-02 DIAGNOSIS — M542 Cervicalgia: Secondary | ICD-10-CM | POA: Diagnosis not present

## 2021-12-02 DIAGNOSIS — M79672 Pain in left foot: Secondary | ICD-10-CM

## 2021-12-02 DIAGNOSIS — R262 Difficulty in walking, not elsewhere classified: Secondary | ICD-10-CM | POA: Diagnosis not present

## 2021-12-02 NOTE — Therapy (Signed)
[facility] [address] [place], [place], [address] Phone: [phone]   Fax:  [phone]  Physical Therapy Treatment And Progress Report (10/09/2021 - 12/02/2021)  Patient Details  Name: Beth Rodgers MRN: [id number] Date of Birth: 01/29/1935 Referring Provider (PT): Copland, Spencer, MD   Encounter Date: 12/02/2021   PT End of Session - 12/02/21 1339     Visit Number 20    Number of Visits 33    Date for PT Re-Evaluation 12/25/21    Authorization Type Medicare    Progress Note Due on Visit 20    PT Start Time 1339    PT Stop Time 1429    PT Time Calculation (min) 50 min    Activity Tolerance Patient tolerated treatment well;No increased pain    Behavior During Therapy WFL for tasks assessed/performed             Past Medical History:  Diagnosis Date   Basal cell carcinoma (BCC)    txted with MOHs in past   Cellulitis 08/10/2018   Chronic venous insufficiency    with varicose veins   Family history of breast cancer    Family history of thyroid cancer    GERD (gastroesophageal reflux disease)    Glaucoma    History  of basal cell carcinoma    left nare/BREAST CANCER   History of chicken pox    Hypertension    Hypoglycemia    Hypothyroid    Osteoarthritis    back pain, L knee pain   Osteopenia 06/2009   DEXA 11/2015: T -2.3 hip, -2.2 spine, on longterm bisphosphonate/evista   Psoriasis    Pyogenic granuloma 04/16/2016    Past Surgical History:  Procedure Laterality Date   BASAL CELL CARCINOMA EXCISION     located on face   BREAST BIOPSY Left    core done ing [place] 2000?   BREAST BIOPSY Left 08/16/2020   3 area bx 4:00 X, IMC, 6:00Q IMC, LN hydro #3-benign   BREAST LUMPECTOMY Left 09/11/2020   two areas of IMC   CATARACT EXTRACTION     bilateral   DEXA  06/2009   T score Spine: -1.8, hip -2.0   EXCISION OF BREAST BIOPSY Left 09/11/2020   Procedure: EXCISION OF BREAST BIOPSY;  Surgeon:  Cintron-Diaz, Edgardo, MD;  Location: [facility] OR[facility];  Service: General;  Laterality: Left;   Foam sclerotherapy Bilateral 09/2015   [place], [place]   MASTECTOMY W/ SENTINEL NODE BIOPSY Left 06/25/2021   Procedure: MASTECTOMY WITH SENTINEL LYMPH NODE BIOPSY;  Surgeon: Cintron-Diaz, Edgardo, MD;  Location: [facility];  Service: General;  Laterality: Left;   NASAL RECONSTRUCTION  2005   for BCC L nare s/p Mohs   nuclear stress test  2014   normal in [place]   PARTIAL MASTECTOMY WITH NEEDLE LOCALIZATION AND AXILLARY SENTINEL LYMPH NODE BX Left 09/11/2020   Procedure: PARTIAL MASTECTOMY WITH NEEDLE LOCALIZATION AND AXILLARY SENTINEL LYMPH NODE BX;  Surgeon: Cintron-Diaz, Edgardo, MD;  Location: [facility];  Service: General;  Laterality: Left;   RADIOFREQUENCY ABLATION Left 01/2012   remnant L G saphenous vein below knee   RADIOFREQUENCY ABLATION Right 02/2013   RLE vein ablation    VEIN LIGATION AND STRIPPING  remote   bilateral G saphenous veins    There were no vitals filed for this visit.   Subjective Assessment - 12/02/21 1341     Subjective L heel is improving a lot. No L heel pain during   gait. L knee joint pain is better. Better able to walk. Has not reached for the SCP when going to the bathroom for the past 2 nights. Feels more secure when walking.    Pertinent History Neck and achilles pain. L knee also bothers her. Had injection L knee which helped 10 days before her breast surgery June 25, 2021. Dr. Copeland also states Pt needs surgery for her L knee (TKA). Pt however has a heel spur on L foot which is chronic. Went to 3 specialists for heel spur and was told that there is nothing can be done surgically. The heel pain however affects her gait which affects her L knee increasing L knee pain. Pt continues to do her HEP from previous PT. Neck pain began about 6 months ago gradually. Started feeling tightness from R medial shoulder blade to neck. Pt is R hand dominant. L foot bothers her more  currently.    Currently in Pain? No/denies                                        PT Education - 12/02/21 1442     Education Details ther-ex    Person(s) Educated Patient    Methods Explanation;Demonstration;Tactile cues;Verbal cues    Comprehension Returned demonstration;Verbalized understanding            Objective     No latex band allergies   B varicose veins legs Osteopenia     L heel pain Hx of L mastectomy on June 25, 2021 and had home health PT for recovery.      Started using a SPC 5 months ago when ambulating by herself. Furniture walk at home.    Medbridge Access Code [email or web address]     Manual therapy Seated STM L lateral hamstrings to decrease tension and improve L knee and ankle mechanics.    Supine medial glide L leg grade 1 for pain control of L knee to promote better L foot and heel mechanics during gait.         Therapeutic exercise   Hooklying hip extension leg straight to promote glute max strength             L 10x3 with 5 second holds   Reviewed an exercise at home per pt request (supine hamstring stretch as well as hip adduction stretch at 90 degrees flexion using green band)  Hooklying manually reisted hip abduction isometrics, leg straight              L 10x3 with 5 second holds     Supine SLR L hip flexion 10x, then 9x   Seated hip ER 10x with 5 second holds   Seated hip adduction isometrics, folded pillow 10x5 seconds for 2 sets  Standing with B UE assist   L hip abduction 10x2  Improved exercise technique, movement at target joints, use of target muscles after min to mod verbal, visual, tactile cues.        Response to treatment Pt tolerated session well without aggravation of symptoms. No L heel or knee pain during gait after session.      Clinical Impression Pt making very good progress with decreased heel pain, improved hip extension strength, and function since initial evaluation. Added L  knee pain goal so as to promote better mechanics and to decrease stress to L heel during gait. No L heel or knee pain   during gait after session reported. Pt will benefit from continued skilled physical therapy services to decrease pain, improve strength and function.       PT Short Term Goals - 10/09/21 1422       PT SHORT TERM GOAL #1   Title Patient will be independent with her initial HEP to improve strength, decrease neck and L heel pain, improve function.    Baseline Does her HEP, no questions (10/09/2021)    Time 3    Period Weeks    Status Achieved    Target Date 09/25/21               PT Long Term Goals - 12/02/21 1401       PT LONG TERM GOAL #1   Title Pt will have a decrease in L heel pain to 4/10 or less at worst to promote ability to ambulate, perform standing tasks more comfortably.    Baseline 8/10 L heel/Achilles pain at worst for the past 3 months (09/04/2021);  5/10 at worst (10/07/2021); 4/10 at most for the past 7 days, most of the time, 2/10, worst is getting up in the middle of the night to go to the bathroom (10/29/2021), 2/10 at most for the pst 7 days (12/02/2021)    Time 8    Period Weeks    Status Achieved    Target Date 10/30/21      PT LONG TERM GOAL #2   Title Pt will improve bilateral hip extension and abduction strength by at least 1/2 MMT grade to promote femoral control and ability to ambulate and perform closed chain tasks with less L heel/Achilles pain.    Baseline Hip extension 4-/5 R, 3+/5 L, hip abduction 3-/5 R, 4-/5 L (09/04/2021); 4+/5 R, 4/5 L, hip abduction 4-/5 R, 4/5 L (L knee pain) 10/09/2021; Hip extension 4+/5 R, 4/5 L. Hip abduction in S/L  not tested secondary to L knee pain  (10/29/2021)    Time 8    Period Weeks    Status Partially Met    Target Date 12/25/21      PT LONG TERM GOAL #3   Title Pt will improve her L foot FOTO score by at least 10 points as a demonstration of improved function.    Baseline L foot FOTO 38  (09/04/2021); 43 (10/29/2021); 53 (12/02/2021)    Time 8    Period Weeks    Status Achieved    Target Date 12/25/21      PT LONG TERM GOAL #4   Title Pt will have a decrease in neck pain to 2/10 or less at worst to promote ability to look around more comfortably.    Baseline 5/10 neck pain at worst for the past 3 months (09/04/2021), (10/07/2021), 1.5/10 at most for the past 7 day (10/29/2021)    Time 8    Period Weeks    Status Achieved    Target Date 10/30/21      PT LONG TERM GOAL #5   Title Patient will have a decrease in L knee pain to 2/10 or less at worst promote better mechanics for L heel during gait.    Baseline 4/10 L knee pain at most for the past 3 weeks (12/02/2021)    Time 3    Period Weeks    Status New    Target Date 12/25/21                   Plan - 12/02/21 1340  Clinical Impression Statement Pt making very good progress with decreased heel pain, improved hip extension strength, and function since initial evaluation. Added L knee pain goal so as to promote better mechanics and to decrease stress to L heel during gait. No L heel or knee pain during gait after session reported. Pt will benefit from continued skilled physical therapy services to decrease pain, improve strength and function.    Personal Factors and Comorbidities Age;Comorbidity 3+;Time since onset of injury/illness/exacerbation;Fitness    Comorbidities Osteopenia, chronic venous insufficiency,Hx of basal cell CA S/P lumpectomy, HTN    Examination-Activity Limitations Stand;Locomotion Level;Squat;Stairs;Carry;Lift    Stability/Clinical Decision Making Stable/Uncomplicated    Clinical Decision Making Low    Rehab Potential Fair    Clinical Impairments Affecting Rehab Potential Chronicity of condition, age, fitness level    PT Frequency 2x / week    PT Duration 8 weeks    PT Treatment/Interventions Aquatic Therapy;Electrical Stimulation;Iontophoresis 4mg/ml Dexamethasone;Gait training;Stair  training;Functional mobility training;Therapeutic activities;Therapeutic exercise;Balance training;Neuromuscular re-education;Patient/family education;Manual techniques;Dry needling    PT Next Visit Plan isometric plantar flexion, glute, trunk, scapular strengthening, femoral control, manual techniques, modalities PRN    PT Home Exercise Plan no changes today    Consulted and Agree with Plan of Care Patient             Patient will benefit from skilled therapeutic intervention in order to improve the following deficits and impairments:  Postural dysfunction, Decreased strength, Improper body mechanics, Pain, Difficulty walking  Visit Diagnosis: Difficulty in walking, not elsewhere classified  Pain in left foot  Cervicalgia     Problem List Patient Active Problem List   Diagnosis Date Noted   Acute respiratory infection 10/06/2021   Pre-op evaluation 06/02/2021   Right wrist pain 02/28/2021   Skin rash 02/28/2021   Ascending aorta dilatation (HCC) 01/10/2021   Left-sided chest pain 01/10/2021   Genetic testing 10/30/2020   Malignant neoplasm of overlapping sites of left breast in female, estrogen receptor positive (HCC) 10/22/2020   Family history of breast cancer    Family history of thyroid cancer    Insertional Achilles tendinopathy 08/28/2020   Breast cancer, left breast (HCC) 08/01/2020   Right carotid bruit 06/05/2020   Chest pressure 06/03/2020   Left Achilles tendinitis 09/01/2019   Low back pain 08/29/2019   Lumbosacral spondylosis without myelopathy 08/29/2019   Calcaneal spur 08/17/2019   Contusion of knee 10/04/2018   Varicose veins of bilateral lower extremities with pain 03/30/2018   Lymphedema 03/30/2018   Dizziness 02/02/2017   Situational depression 12/18/2016   Vitamin D deficiency 10/11/2016   Fatigue 08/12/2016   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    PT, DPT    12/02/2021, 2:48 PM  [facility]IC[facility] [address] [place], [place], [address] Phone: [phone]   Fax:  [phone]    Name: Beth Rodgers MRN: [id number] Date of Birth: 1935-04-05

## 2021-12-03 ENCOUNTER — Other Ambulatory Visit: Payer: Self-pay

## 2021-12-03 ENCOUNTER — Telehealth: Payer: Self-pay

## 2021-12-03 NOTE — Chronic Care Management (AMB) (Signed)
Chronic Care Management Pharmacy Assistant   Name: NATASHA BURDA  MRN: [id number] DOB: [birth year]  Beth Rodgers is an 86 y.o. year old female who presents for his initial CCM visit with the clinical pharmacist.  Reason for Encounter: Initial Questions   Conditions to be addressed/monitored: HTN   Recent office visits:  10/27/21-PCP-Javier Gutierrez,MD-Patient presented for follow up upper respiratory infection.She's been taking cefdinir 300mg  once daily - discussed correct dosing is BID - Started benzonatate 100mg  take 3 times daily for cough. 10/16/21-Family Medicine-Spencer Copland,MD-Patient presented for left knee injection for pain-Referral for surgery  10/06/21-PCP-Javier Gutierrez,MD-Patient presented for a cough.Start Azithromycin 250mg  take 2 tablets day one, take 1 tablet once a day. 09/04/21-Family Medicine-Spencer Copland,MD-Patient presented for knee pain.Surgery referral. 07/21/21-Family Medicine-Spencer Copland,MD-Patient presented for left heel pain ,neck pain.Referral for physical therapy and started on Prednisone 20mg  take 2 tablets daily for 5 days then 1 tablet once daily . 06/23/21-PCP-Javier Gutierrez,MD-Patient presented for AWV.Discussed vaccines, screenings.No labs ordered, no medication changes follow up 3 months 06/09/21-Family Medicine-Spencer Copland,MD-Patient presented for left knee pain.Injection of Kenalog 40mg  given. Recent consult visits:  12/18/21-Oncology-Archana Rao,MD-Patient presented for follow up breast cancer.Discussed mammogram, follow up 4  months 12/06/21-[facility]-Benjamin Smith,PA-Patient presented for left knee pain.Xrays,referred for knee surgery, no medication changes  10/21/21-General Surgery-Edgardo Diaz,MD-Patient presented to discuss surgery. No medication changes 09/19/21-Orthopedics-John Hewitt,MD-Patient presented for left heel pain- no data found 09/15/21-Dermatology-Tara Stewart,MD-Patient presented for follow up rash.Start  Tacrolimus ointment-apply to rash twice daily,may use desonide for few days. Vanicream for around eyes and face wash.  08/26/21-Dermatology-Tara Stewart,MD-Patient presented for follow up psoriasis.Start Ciclopirox cream to AA QD Start tacrolimus 0.1% ointment clobetasol cream (TEMOVATE) 2.20 %Apply 1 application topically as directed. Qd to bid up to 5 days a week to aa psoriasis on body until clear, then prn flares, avoid face, groin, axilla 08/20/21-Vascular Surgery-Fallon Brown,NP-Patient presented for evaluaation of circulation in bilateral lower legs. Noninvasive testing, referral to Neurology. 07/25/21-Oncology-Archana Rao,MD-Patient presented for follow up breast cancer. No medication changes 06/20/21-Oncology-Archana Rao,MD-Patient presented for breast cancer. No medication changes . Hospital visits:  06/25/21 thru 06/16/21-[facility]-Edgardo Diaz,MD- Patient presented for breast surgery.Discharge to home with restrictions,hydrocodone 5/325mg  every 6 hours as needed for pain.  Medications: Outpatient Encounter Medications as of 12/03/2021  Medication Sig Note   alendronate (FOSAMAX) 70 MG tablet Take 70 mg by mouth every Sunday.    benzonatate (TESSALON) 100 MG capsule Take 1 capsule (100 mg total) by mouth 3 (three) times daily as needed for cough.    Calcium Carb-Cholecalciferol (CALCIUM 600 + D PO) Take 1 tablet by mouth daily.    calcium carbonate (TUMS EX) 750 MG chewable tablet Chew 750 mg by mouth daily as needed for heartburn.    celecoxib (CELEBREX) 200 MG capsule Take by mouth. (Patient not taking: Reported on 11/18/2021) 11/18/2021: Has not started. Inflammation of the knee.    Cholecalciferol (VITAMIN D) 50 MCG (2000 UT) CAPS Take 1 capsule (2,000 Units total) by mouth daily. 09/05/2020: Not regularly   Cholecalciferol 50 MCG (2000 UT) CAPS Take 1 capsule by mouth daily as needed.    Ciclopirox 0.77 % gel Apply 1 application topically 2 (two) times daily as needed (fungus). (Patient not  taking: Reported on 11/18/2021)    clobetasol cream (TEMOVATE) 2.54 % Apply 1 application topically as directed. Qd to bid up to 5 days a week to aa psoriasis on body until clear, then prn flares, avoid face, groin, axilla (Patient taking differently: Apply 1 application topically  2 (two) times daily.)    diclofenac sodium (VOLTAREN) 1 % GEL Apply 1 application topically 3 (three) times daily. (Patient taking differently: Apply 1 application topically 3 (three) times daily as needed (pain).)    latanoprost (XALATAN) 0.005 % ophthalmic solution latanoprost 0.005 % eye drops  PLACE 1 DROP INTO BOTH EYES ONCE EVERY EVENING    levothyroxine (SYNTHROID) 75 MCG tablet Take 1 tablet (75 mcg total) by mouth daily.    losartan (COZAAR) 100 MG tablet TAKE 1 TABLET BY MOUTH EVERY DAY    Magnesium 400 MG TABS Take 400 mg by mouth daily.    Multiple Vitamin (MULTIVITAMIN) tablet Take 1 tablet by mouth daily.    NITROSTAT 0.4 MG SL tablet Place 0.4 mg under the tongue every 5 (five) minutes as needed for chest pain.  (Patient not taking: Reported on 11/18/2021) 06/11/2021: Pt's rx expired, needs to get more   SELENIUM PO Take by mouth daily.    silver sulfADIAZINE (SILVADENE) 1 % cream Apply 1 application topically daily. (Patient not taking: Reported on 11/18/2021)    tacrolimus (PROTOPIC) 0.1 % ointment APPLY TOPICALLY TO AFFECTED AREA(S) TWICE DAILY    Travoprost, BAK Free, (TRAVATAN) 0.004 % SOLN ophthalmic solution Place 1 drop into both eyes at bedtime.    vitamin C (ASCORBIC ACID) 500 MG tablet Take 500 mg by mouth daily.    vitamin E 180 MG (400 UNITS) capsule Take 400 Units by mouth daily.    No facility-administered encounter medications on file as of 12/03/2021.    No results found for: HGBA1C, MICROALBUR   BP Readings from Last 3 Encounters:  11/18/21 126/64  10/27/21 138/74  10/16/21 128/80    Patient contacted to review initial questions prior to visit with Charlene Brooke.  Have you  seen any other providers since your last visit with PCP? Yes Oncology and Orthopedics  Any changes in your medications or health? No  Any side effects from any medications? Yes  The patient states she reacted to one of the antibiotics recently with the breast surgery she had   Do you have an symptoms or problems not managed by your medications? No  Any concerns about your health right now? Yes The patient reports she still has skin irritation even after using cortisone cream.  Has your provider asked that you check blood pressure, blood sugar, or follow special diet at home? No, the patient reports she has a home BP unit but does not think it works correctly.  Do you get any type of exercise on a regular basis?  No , she is getting ready for total knee replacement.  Can you think of a goal you would like to reach for your health? Yes She still has slight cough and wants this to go away.  Do you have any problems getting your medications? No [facility] is good to the patient .   Is there anything that you would like to discuss during the appointment? The patient reports she will have a list of concerns to discuss.   Spoke with patient and reminded them to have all medications, supplements and any blood glucose and blood pressure readings available for review with pharmacist, at their telephone visit on 12/08/21 at 11:00am.   Star Rating Drugs:  Medication:  Last Fill: Day Supply Losartan 100mg  09/22/21 90            verified fill [facility] and [facility]   Care Gaps: Annual wellness visit in last  year? Yes Most Recent BP reading:126/64  81-P 11/18/21   Marjo Bicker CPP notified  Avel Sensor, [place] Assistant [phone]  Total time spent for month CPA: 50 min.

## 2021-12-04 ENCOUNTER — Ambulatory Visit: Payer: Medicare Other

## 2021-12-04 ENCOUNTER — Inpatient Hospital Stay: Admission: RE | Admit: 2021-12-04 | Payer: Medicare Other | Source: Ambulatory Visit

## 2021-12-04 DIAGNOSIS — R262 Difficulty in walking, not elsewhere classified: Secondary | ICD-10-CM | POA: Diagnosis not present

## 2021-12-04 DIAGNOSIS — M79672 Pain in left foot: Secondary | ICD-10-CM | POA: Diagnosis not present

## 2021-12-04 DIAGNOSIS — M542 Cervicalgia: Secondary | ICD-10-CM | POA: Diagnosis not present

## 2021-12-04 NOTE — Therapy (Signed)
[facility] [address], [place], [address] Phone: [phone]   Fax:  [phone]  Physical Therapy Treatment  Patient Details  Name: Beth Rodgers MRN: [id number] Date of Birth: 04/13/1935 Referring Provider (PT): Owens Loffler, MD   Encounter Date: 12/04/2021   PT End of Session - 12/04/21 1419     Visit Number 21    Number of Visits 33    Date for PT Re-Evaluation 12/25/21    Authorization Type Medicare    Progress Note Due on Visit 20    PT Start Time 1419    PT Stop Time 1459    PT Time Calculation (min) 40 min    Activity Tolerance Patient tolerated treatment well;No increased pain    Behavior During Therapy [facility] for tasks assessed/performed             Past Medical History:  Diagnosis Date   Basal cell carcinoma (BCC)    txted with MOHs in past   Cellulitis 08/10/2018   Chronic venous insufficiency    with varicose veins   Family history of breast cancer    Family history of thyroid cancer    GERD (gastroesophageal reflux disease)    Glaucoma    History  of basal cell carcinoma    left nare/BREAST CANCER   History of chicken pox    Hypertension    Hypoglycemia    Hypothyroid    Osteoarthritis    back pain, L knee pain   Osteopenia 06/2009   DEXA 11/2015: T -2.3 hip, -2.2 spine, on longterm bisphosphonate/evista   Psoriasis    Pyogenic granuloma 04/16/2016    Past Surgical History:  Procedure Laterality Date   BASAL CELL CARCINOMA EXCISION     located on face   BREAST BIOPSY Left    core done ing [place]?   BREAST BIOPSY Left 08/16/2020   3 area bx 4:00 X, IMC, 6:00Q IMC, LN hydro #3-benign   BREAST LUMPECTOMY Left 09/11/2020   two areas of [facility]   CATARACT EXTRACTION     bilateral   DEXA  06/2009   T score Spine: -1.8, hip -2.0   EXCISION OF BREAST BIOPSY Left 09/11/2020   Procedure: EXCISION OF BREAST BIOPSY;  Surgeon: [name], MD;  Location: [facility] OR[facility];   Service: General;  Laterality: Left;   Foam sclerotherapy Bilateral 09/2015   [place], [place]   MASTECTOMY W/ SENTINEL NODE BIOPSY Left 06/25/2021   Procedure: MASTECTOMY WITH SENTINEL LYMPH NODE BIOPSY;  Surgeon: [name], MD;  Location: [facility];  Service: General;  Laterality: Left;   NASAL RECONSTRUCTION  2005   for [place] L nare s/p Mohs   nuclear stress test  2014   normal in [place] BX Left 09/11/2020   Procedure: PARTIAL MASTECTOMY WITH NEEDLE LOCALIZATION AND AXILLARY SENTINEL LYMPH NODE [place];  Surgeon: [name], MD;  Location: [facility];  Service: General;  Laterality: Left;   RADIOFREQUENCY ABLATION Left 01/2012   remnant L G saphenous vein below knee   RADIOFREQUENCY ABLATION Right 02/2013   RLE vein ablation    VEIN LIGATION AND STRIPPING  remote   bilateral G saphenous veins    There were no vitals filed for this visit.   Subjective Assessment - 12/04/21 1420     Subjective L knee is getting better. No pain currently when walking (no AD used). Just a discomfort L  heel but no pain. Has also been taking Celebrex which is taking away some of the inflammation.    Pertinent History Neck and achilles pain. L knee also bothers her. Had injection L knee which helped 10 days before her breast surgery June 25, 2021. Dr. [name] also states Pt needs surgery for her L knee (TKA). Pt however has a heel spur on L foot which is chronic. Went to 3 specialists for heel spur and was told that there is nothing can be done surgically. The heel pain however affects her gait which affects her L knee increasing L knee pain. Pt continues to do her HEP from previous PT. Neck pain began about 6 months ago gradually. Started feeling tightness from R medial shoulder blade to neck. Pt is R hand dominant. L foot bothers her more currently.    Currently in Pain? No/denies                                         PT Education - 12/04/21 1454     Education Details ther-ex    Person(s) Educated Patient    Methods Explanation;Demonstration;Tactile cues;Verbal cues    Comprehension Returned demonstration;Verbalized understanding           Objective     No latex band allergies   B varicose veins legs Osteopenia     L heel pain Hx of L mastectomy on June 25, 2021 and had home health PT for recovery.      Started using a SPC 5 months ago when ambulating by herself. Furniture walk at home.    Med[employer] Access Code [email or web address]     Manual therapy Seated STM L lateral hamstrings to decrease tension and improve L knee and ankle mechanics.   Seated STM to anterior knee to decrease fascial restrictions    Supine medial glide L leg grade 1 for pain control of L knee to promote better L foot and heel mechanics during gait.         Therapeutic exercise   Hooklying hip extension leg straight to promote glute max strength             L 10x3 with 5 second holds    Hooklying manually reisted hip abduction isometrics, leg straight              L 10x3 with 5 second holds       Supine SLR hip flexion L 10x    Improved exercise technique, movement at target joints, use of target muscles after min to mod verbal, visual, tactile cues.        Response to treatment Pt tolerated session well without aggravation of symptoms.         Clinical Impression Improving L knee pain reported with pt currently able to ambulate short distances without her SPC pain free from heel and knee. Contiued working on manual therapy to decrease lateral hamstrings muscle tension, knee fascial restrictions, and to improve joint nutrition L knee as well as improving glute strength to pormote better mechanics at her L knee and therefore L foot. Pt tolerated session well without aggravation of symptoms. Pt will benefit from continued skilled physical therapy services  to decrease pain, improve strength and function.        PT Short Term Goals - 10/09/21 1422       PT SHORT TERM GOAL #1   Title  Patient will be independent with her initial HEP to improve strength, decrease neck and L heel pain, improve function.    Baseline Does her HEP, no questions (10/09/2021)    Time 3    Period Weeks    Status Achieved    Target Date 09/25/21               PT Long Term Goals - 12/02/21 1401       PT LONG TERM GOAL #1   Title Pt will have a decrease in L heel pain to 4/10 or less at worst to promote ability to ambulate, perform standing tasks more comfortably.    Baseline 8/10 L heel/Achilles pain at worst for the past 3 months (09/04/2021);  5/10 at worst (10/07/2021); 4/10 at most for the past 7 days, most of the time, 2/10, worst is getting up in the middle of the night to go to the bathroom (10/29/2021), 2/10 at most for the pst 7 days (12/02/2021)    Time 8    Period Weeks    Status Achieved    Target Date 10/30/21      PT LONG TERM GOAL #2   Title Pt will improve bilateral hip extension and abduction strength by at least 1/2 MMT grade to promote femoral control and ability to ambulate and perform closed chain tasks with less L heel/Achilles pain.    Baseline Hip extension 4-/5 R, 3+/5 L, hip abduction 3-/5 R, 4-/5 L (09/04/2021); 4+/5 R, 4/5 L, hip abduction 4-/5 R, 4/5 L (L knee pain) 10/09/2021; Hip extension 4+/5 R, 4/5 L. Hip abduction in S/L  not tested secondary to L knee pain  (10/29/2021)    Time 8    Period Weeks    Status Partially Met    Target Date 12/25/21      PT LONG TERM GOAL #3   Title Pt will improve her L foot FOTO score by at least 10 points as a demonstration of improved function.    Baseline L foot FOTO 38 (09/04/2021); 43 (10/29/2021); 53 (12/02/2021)    Time 8    Period Weeks    Status Achieved    Target Date 12/25/21      PT LONG TERM GOAL #4   Title Pt will have a decrease in neck pain to 2/10 or less at worst to promote  ability to look around more comfortably.    Baseline 5/10 neck pain at worst for the past 3 months (09/04/2021), (10/07/2021), 1.5/10 at most for the past 7 day (10/29/2021)    Time 8    Period Weeks    Status Achieved    Target Date 10/30/21      PT LONG TERM GOAL #5   Title Patient will have a decrease in L knee pain to 2/10 or less at worst promote better mechanics for L heel during gait.    Baseline 4/10 L knee pain at most for the past 3 weeks (12/02/2021)    Time 3    Period Weeks    Status New    Target Date 12/25/21                   Plan - 12/04/21 1419     Clinical Impression Statement Improving L knee pain reported with pt currently able to ambulate short distances without her SPC pain free from heel and knee. Contiued working on manual therapy to decrease lateral hamstrings muscle tension, knee fascial restrictions, and to improve joint nutrition  L knee as well as improving glute strength to pormote better mechanics at her L knee and therefore L foot. Pt tolerated session well without aggravation of symptoms. Pt will benefit from continued skilled physical therapy services to decrease pain, improve strength and function.    Personal Factors and Comorbidities Age;Comorbidity 3+;Time since onset of injury/illness/exacerbation;Fitness    Comorbidities Osteopenia, chronic venous insufficiency,Hx of basal cell CA S/P lumpectomy, HTN    Examination-Activity Limitations Stand;Locomotion Level;Squat;Stairs;Carry;Lift    Stability/Clinical Decision Making Stable/Uncomplicated    Rehab Potential Fair    Clinical Impairments Affecting Rehab Potential Chronicity of condition, age, fitness level    PT Frequency 2x / week    PT Duration 8 weeks    PT Treatment/Interventions Aquatic Therapy;Electrical Stimulation;Iontophoresis 62m/ml Dexamethasone;Gait training;Stair training;Functional mobility training;Therapeutic activities;Therapeutic exercise;Balance training;Neuromuscular  re-education;Patient/family education;Manual techniques;Dry needling    PT Next Visit Plan isometric plantar flexion, glute, trunk, scapular strengthening, femoral control, manual techniques, modalities PRN    PT Home Exercise Plan no changes today    Consulted and Agree with Plan of Care Patient             Patient will benefit from skilled therapeutic intervention in order to improve the following deficits and impairments:  Postural dysfunction, Decreased strength, Improper body mechanics, Pain, Difficulty walking  Visit Diagnosis: Difficulty in walking, not elsewhere classified  Pain in left foot     Problem List Patient Active Problem List   Diagnosis Date Noted   Acute respiratory infection 10/06/2021   Pre-op evaluation 06/02/2021   Right wrist pain 02/28/2021   Skin rash 02/28/2021   Ascending aorta dilatation (HCC) 01/10/2021   Left-sided chest pain 01/10/2021   Genetic testing 10/30/2020   Malignant neoplasm of overlapping sites of left breast in female, estrogen receptor positive ([place] 10/22/2020   Family history of breast cancer    Family history of thyroid cancer    Insertional Achilles tendinopathy 08/28/2020   [place], [place] breast [place] 08/01/[place]   Right carotid bruit 06/05/2020   Chest pressure 06/03/2020   Left Achilles tendinitis 09/01/2019   Low back pain 08/29/2019   Lumbosacral spondylosis without myelopathy 08/29/2019   Calcaneal spur 08/17/2019   Contusion of knee 10/04/2018   Varicose veins of bilateral lower extremities with pain 03/30/2018   Lymphedema 03/30/2018   Dizziness 02/02/2017   Situational depression 12/18/2016   Vitamin D deficiency 10/11/2016   Fatigue 08/12/2016   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 04/14/2016   Essential hypertension 12/17/2015   Dermatitis of external ear 12/17/2015   Corn of foot 05/16/2015   Advanced care planning/counseling discussion 10/30/2014   Neck pain on right side  06/25/2014   Primary osteoarthritis of left knee 08/17/2013   Oropharyngeal dysphagia 11/15/2012   Stressful life events affecting family and household 03/30/2012   Medicare annual wellness visit, subsequent 03/30/2012   History of basal cell carcinoma    Osteoarthritis    Osteopenia    Hypothyroidism    Glaucoma    Chronic venous insufficiency     MJoneen BoersPT, DPT   12/04/2021, 4:13 PM  [facility] [address] [place] [address]Phone: [phone]  Fax:  [phone] Name: Beth CROWNOVERMRN: [id number] of Birth: [birth year]

## 2021-12-05 ENCOUNTER — Ambulatory Visit
Admission: RE | Admit: 2021-12-05 | Discharge: 2021-12-05 | Disposition: A | Discharge status: Home / Self Care | Payer: Medicare Other | Source: Ambulatory Visit | Attending: Oncology | Admitting: Oncology

## 2021-12-05 ENCOUNTER — Other Ambulatory Visit: Payer: Self-pay

## 2021-12-05 DIAGNOSIS — Z1231 Encounter for screening mammogram for malignant neoplasm of breast: Secondary | ICD-10-CM | POA: Insufficient documentation

## 2021-12-08 ENCOUNTER — Other Ambulatory Visit: Payer: Self-pay

## 2021-12-08 ENCOUNTER — Ambulatory Visit: Payer: Medicare Other | Admitting: Pharmacist

## 2021-12-08 DIAGNOSIS — M85852 Other specified disorders of bone density and structure, left thigh: Secondary | ICD-10-CM

## 2021-12-08 DIAGNOSIS — M159 Polyosteoarthritis, unspecified: Secondary | ICD-10-CM

## 2021-12-08 DIAGNOSIS — I10 Essential (primary) hypertension: Secondary | ICD-10-CM

## 2021-12-08 DIAGNOSIS — E039 Hypothyroidism, unspecified: Secondary | ICD-10-CM

## 2021-12-08 NOTE — Progress Notes (Signed)
Chronic Care Management Pharmacy Note  12/10/2021 Name:  Beth Rodgers MRN:  [id number] DOB:  1935/09/18  Summary: -Pt endorses compliance with medications as prescribed -Discussed purpose of medications and goals of care  Recommendations/Changes made from today's visit: -No med changes -Advised flu shot at local pharmacy  Plan: -Freeman will call patient 3 months for general adherence -Pharmacist follow up televisit scheduled for 6 months    Subjective: Beth Rodgers is an 86 y.o. year old female who is a primary patient of Ria Bush, MD.  The CCM team was consulted for assistance with disease management and care coordination needs.    Engaged with patient by telephone for initial visit in response to provider referral for pharmacy case management and/or care coordination services.   Consent to Services:  The patient was given the following information about Chronic Care Management services today, agreed to services, and gave verbal consent: 1. CCM service includes personalized support from designated clinical staff supervised by the primary care provider, including individualized plan of care and coordination with other care providers 2. 24/7 contact phone numbers for assistance for urgent and routine care needs. 3. Service will only be billed when office clinical staff spend 20 minutes or more in a month to coordinate care. 4. Only one practitioner may furnish and bill the service in a calendar month. 5.The patient may stop CCM services at any time (effective at the end of the month) by phone call to the office staff. 6. The patient will be responsible for cost sharing (co-pay) of up to 20% of the service fee (after annual deductible is met). Patient agreed to services and consent obtained.  Patient Care Team: Ria Bush, MD as PCP - General (Family Medicine) Rico Junker, RN as Registered Nurse Charlton Haws, [facility] as Pharmacist  (Pharmacist)   Recent office visits: 10/27/21-PCP-Javier Gutierrez,MD-Patient presented for follow up upper respiratory infection.She's been taking cefdinir 336m once daily - discussed correct dosing is BID - Started benzonatate 1026mtake 3 times daily for cough.  10/16/21-Family Medicine-Spencer Copland,MD-Patient presented for left knee injection for pain-Referral for orthopedic surgery (interested in TKA)  10/06/21-PCP-Javier Gutierrez,MD-Patient presented for a cough. Start Azithromycin 25026make 2 tablets day one, take 1 tablet once a day.  09/04/21-Family Medicine-Spencer Copland,MD-Patient presented for knee pain. Orthopedic Surgery referral.  07/21/21-Family Medicine-Spencer Copland,MD-Patient presented for left heel pain ,neck pain.Referral for physical therapy and started on Prednisone 9m36mke 2 tablets daily for 5 days then 1 tablet once daily .  06/23/21-PCP-Javier Gutierrez,MD-Patient presented for AWV. Discussed vaccines, screenings.No labs ordered, no medication changes follow up 3 months  06/09/21-Family Medicine-Spencer Copland,MD-Patient presented for left knee pain.Injection of Kenalog 40mg53men.  Recent consult visits: 11/18/21-Oncology-Archana Rao,MD-F/u breast cancer. Advised mammogram, follow up 4 months. Cancer hx: Dx 07/2020 (2 masses). S/p Lumpectomy 08/2020. S/p Mastectomy 05/2021.  11/06/21-Benjamin Smith,PA (Ortho) Patient presented for left knee pain.Xrays,referred for knee surgery w/ Dr HooteMarry Guanmedication changes   10/21/21-General Surgery-Edgardo Diaz,MD-Patient presented to discuss surgery. No medication changes  09/19/21-Orthopedics-John Hewitt,MD-Patient presented for left heel pain- no data found  09/15/21-Dermatology-Tara Stewart,MD-Patient presented for follow up rash.Start Tacrolimus ointment-apply to rash twice daily,may use desonide for few days. Vanicream for around eyes and face wash.   08/26/21-Dermatology-Tara Stewart,MD-Patient presented for  follow up psoriasis.Start Ciclopirox cream to AA QD Start tacrolimus 0.1% ointment clobetasol cream 0.05 %  08/20/21-Vascular Surgery-Fallon Brown,NP-Patient presented for evaluation of circulation in bilateral lower legs. Pain unrelated to vascular causes, may  be neuropathy. Referred to neurology for evaluation.  07/25/21-Oncology-Archana Rao,MD-Patient presented for follow up breast cancer. Advised tamoxifen but pt unable to tolerate AI's previously, pt declined due to concern for side effects/QOL.  06/20/21-Oncology-Archana Rao,MD-Patient presented for breast cancer. No medication changes .  Hospital visits: None in previous 6 months   Objective:  Lab Results  Component Value Date   CREATININE 0.72 06/02/2021   BUN 19 06/02/2021   GFR 76.97 06/02/2021   GFRNONAA >60 01/04/2021   GFRAA >60 08/21/2020   NA 139 06/02/2021   K 4.3 06/02/2021   CALCIUM 9.8 06/02/2021   CO2 27 06/02/2021   GLUCOSE 82 06/02/2021    Lab Results  Component Value Date/Time   GFR 76.97 06/02/2021 12:27 PM   GFR 76.18 02/16/2020 11:28 AM    Last diabetic Eye exam: No results found for: HMDIABEYEEXA  Last diabetic Foot exam: No results found for: HMDIABFOOTEX   Lab Results  Component Value Date   CHOL 193 02/16/2020   HDL 77.30 02/16/2020   LDLCALC 104 (H) 02/16/2020   LDLDIRECT 124.9 08/31/2013   TRIG 59.0 02/16/2020   CHOLHDL 2 02/16/2020    Hepatic Function Latest Ref Rng & Units 08/21/2020 02/16/2020 06/25/2017  Total Protein 6.5 - 8.1 g/dL 7.4 6.7 7.2  Albumin 3.5 - 5.0 g/dL 4.3 4.1 4.3  AST 15 - 41 U/L _0 ALT 0 - 44 U/L _1 Alk Phosphatase 38 - 126 U/L 82 87 76  Total Bilirubin 0.3 - 1.2 mg/dL 0.4 0.6 0.5    Lab Results  Component Value Date/Time   TSH 2.44 06/02/2021 12:27 PM   TSH 4.25 02/16/2020 11:28 AM   FREET4 1.07 02/16/2020 11:28 AM    CBC Latest Ref Rng & Units 06/02/2021 01/04/2021 08/21/2020  WBC 4.0 - 10.5 K/uL 6.3 7.7 7.4  Hemoglobin 12.0 - 15.0 g/dL 13.9  13.4 13.7  Hematocrit 36.0 - 46.0 % 40.9 41.2 40.6  Platelets 150.0 - 400.0 K/uL 230.0 226 221    Lab Results  Component Value Date/Time   VD25OH 50.58 02/16/2020 11:28 AM   VD25OH 21.43 (L) 03/14/2019 11:25 AM    Clinical ASCVD: No  The ASCVD Risk score (Arnett DK, et al., 2019) failed to calculate for the following reasons:   The 2019 ASCVD risk score is only valid for ages 30 to 36    Depression screen PHQ 2/9 06/19/2021 05/31/2020 05/26/2019  Decreased Interest 0 0 0  Down, Depressed, Hopeless 0 0 0  PHQ - 2 Score 0 0 0  Altered sleeping 0 0 0  Tired, decreased energy 0 0 0  Change in appetite 0 0 0  Feeling bad or failure about yourself  0 0 0  Trouble concentrating 0 0 0  Moving slowly or fidgety/restless 0 0 0  Suicidal thoughts 0 0 0  PHQ-9 Score 0 0 0  Difficult doing work/chores Not difficult at all Not difficult at all Not difficult at all  Some recent data might be hidden     Social History   Tobacco Use  Smoking Status Never  Smokeless Tobacco Never   BP Readings from Last 3 Encounters:  11/18/21 126/64  10/27/21 138/74  10/16/21 128/80   Pulse Readings from Last 3 Encounters:  11/18/21 81  10/27/21 74  10/16/21 92   Wt Readings from Last 3 Encounters:  11/18/21 173 lb (78.5 kg)  10/27/21 170 lb 6 oz (77.3 kg)  10/16/21 172 lb 4 oz (78.1 kg)  BMI Readings from Last 3 Encounters:  11/18/21 30.65 kg/m  10/27/21 30.18 kg/m  10/16/21 30.51 kg/m    Assessment/Interventions: Review of patient past medical history, allergies, medications, health status, including review of consultants reports, laboratory and other test data, was performed as part of comprehensive evaluation and provision of chronic care management services.   SDOH:  (Social Determinants of Health) assessments and interventions performed: Yes  SDOH Screenings   Alcohol Screen: Low Risk    Last Alcohol Screening Score (AUDIT): 1  Depression (PHQ2-9): Low Risk    PHQ-2 Score: 0   Financial Resource Strain: Low Risk    Difficulty of Paying Living Expenses: Not hard at all  Food Insecurity: No Food Insecurity   Worried About Charity fundraiser in the Last Year: Never true   Ran Out of Food in the Last Year: Never true  Housing: Low Risk    Last Housing Risk Score: 0  Physical Activity: Sufficiently Active   Days of Exercise per Week: 7 days   Minutes of Exercise per Session: 30 min  Social Connections: Not on file  Stress: No Stress Concern Present   Feeling of Stress : Not at all  Tobacco Use: Low Risk    Smoking Tobacco Use: Never   Smokeless Tobacco Use: Never   Passive Exposure: Not on file  Transportation Needs: No Transportation Needs   Lack of Transportation (Medical): No   Lack of Transportation (Non-Medical): No    CCM Care Plan  Allergies  Allergen Reactions   Anastrozole Other (See Comments)    Back pain    Exemestane     Pain in right hand    Naproxen Hives    Had bumps break out on the back     Medications Reviewed Today     Reviewed by Charlton Haws, [facility] (Pharmacist) on 12/08/21 at 1149  Med List Status: <None>   Medication Order Taking? Sig Documenting Provider Last Dose Status Informant  alendronate (FOSAMAX) 70 MG tablet [id number] Yes Take 70 mg by mouth every Sunday. [provider] Taking Active Self  Calcium Carb-Cholecalciferol (CALCIUM 600 + D PO) [id number] Yes Take 1 tablet by mouth daily. Ca 500 + Vit D 800 [provider] Taking Active Self  calcium carbonate (TUMS EX) 750 MG chewable tablet [id number] Yes Chew 750 mg by mouth daily as needed for heartburn. [provider] Taking Active Self  celecoxib (CELEBREX) 200 MG capsule [id number] Yes Take 200 mg by mouth daily. [provider] Taking Active            Med Note Luna Glasgow Dec 08, 2021 11:11 AM)    Ciclopirox 0.77 % gel [id number] Yes Apply 1 application topically 2 (two) times daily as needed (fungus).  [provider] Taking Active Self  clobetasol cream (TEMOVATE) 0.05 % [id number] Yes Apply 1 application topically as directed. Qd to bid up to 5 days a week to aa psoriasis on body until clear, then prn flares, avoid face, groin, axilla  Patient taking differently: Apply 1 application topically 2 (two) times daily.   [name], MD Taking Active   diclofenac sodium (VOLTAREN) 1 % GEL [id number] Yes Apply 1 application topically 3 (three) times daily.  Patient taking differently: Apply 1 application topically 3 (three) times daily as needed (pain).   Ria Bush, MD Taking Active   latanoprost (XALATAN) 0.005 % ophthalmic solution [id number] Yes latanoprost 0.005 % eye drops  PLACE 1 DROP INTO  BOTH EYES ONCE EVERY EVENING [provider] Taking Active   levothyroxine (SYNTHROID) 75 MCG tablet [id number] Yes Take 1 tablet (75 mcg total) by mouth daily. Ria Bush, MD Taking Active   losartan (COZAAR) 100 MG tablet [id number] Yes TAKE 1 TABLET BY MOUTH EVERY DAY Ria Bush, MD Taking Active   Magnesium 400 MG TABS [id number] Yes Take 400 mg by mouth daily. [provider] Taking Active Self  Multiple Vitamin (MULTIVITAMIN) tablet [id number] Yes Take 1 tablet by mouth daily. [provider] Taking Active            Med Note Jilda Roche A   Wed Jun 11, 2021  4:39 PM)    SELENIUM PO [id number] Yes Take by mouth daily. [provider] Taking Active Self  silver sulfADIAZINE (SILVADENE) 1 % cream [id number] Yes Apply 1 application topically daily. Schnier, Dolores Lory, MD Taking Active   tacrolimus (PROTOPIC) 0.1 % ointment [id number] Yes APPLY TOPICALLY TO AFFECTED AREA(S) TWICE DAILY [name], MD Taking Active   vitamin C (ASCORBIC ACID) 500 MG tablet [id number] Yes Take 500 mg by mouth daily. [provider] Taking Active Self  vitamin E 180 MG (400 UNITS) capsule [id number] Yes Take 400 Units by mouth daily. [provider] Taking Active Self            Patient Active Problem List   Diagnosis Date Noted   Acute respiratory infection 10/06/2021   Pre-op evaluation 06/02/2021   Right wrist pain 02/28/2021   Skin rash 02/28/2021   Ascending aorta dilatation ([place]) 01/10/2021   Left-sided chest pain 01/10/2021   Genetic testing 10/30/2020   Malignant neoplasm of overlapping sites of left breast in female, estrogen receptor positive ([place]) 10/22/2020   Family history of breast cancer    Family history of thyroid cancer    Insertional Achilles tendinopathy 08/28/2020   Breast cancer, left breast ([place]) 08/01/2020   Right carotid bruit 06/05/2020   Chest pressure 06/03/2020   Left Achilles tendinitis 09/01/2019   Low back pain 08/29/2019   Lumbosacral spondylosis without myelopathy 08/29/2019   Calcaneal spur 08/17/2019   Contusion of knee 10/04/2018   Varicose veins of bilateral lower extremities with pain 03/30/2018   Lymphedema 03/30/2018   Dizziness 02/02/2017   Situational depression 12/18/2016   Vitamin D deficiency 10/11/2016   Fatigue 08/12/2016   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 04/14/2016   Essential hypertension 12/17/2015   Dermatitis of external ear 12/17/2015   Corn of foot 05/16/2015   Advanced care planning/counseling discussion 10/30/2014   Neck pain on right side 06/25/2014   Primary osteoarthritis of left knee 08/17/2013   Oropharyngeal dysphagia 11/15/2012   Stressful life events affecting family and household 03/30/2012   Medicare annual wellness visit, subsequent 03/30/2012   History of basal cell carcinoma    Osteoarthritis    Osteopenia    Hypothyroidism    Glaucoma    Chronic venous insufficiency     Immunization History  Administered Date(s) Administered   Fluad Quad(high Dose 65+) 09/22/2019   Hep A / Hep B 06/05/2015, 12/17/2015   Influenza Split 11/15/2012   Influenza, High Dose Seasonal PF 11/14/2014, 10/28/2020    Influenza,inj,Quad PF,6+ Mos 09/06/2013, 12/18/2016, 10/05/2018   PFIZER(Purple Top)SARS-COV-2 Vaccination 01/28/2020, 02/20/2020, 12/09/2020   Pfizer Covid-19 Vaccine Bivalent Booster 62yr & up 09/22/2021   Pneumococcal Conjugate-13 10/30/2014   Pneumococcal Polysaccharide-23 11/15/2012   Td 05/24/2012   Zoster, Live 07/03/2014    Conditions  to be addressed/monitored:  Hypertension, Hyperlipidemia, Hypothyroidism, and Osteopenia  Care Plan : [place]  Updates made by Charlton Haws, [place] since 12/10/2021 12:00 AM     Problem: Hypertension, Hyperlipidemia, Hypothyroidism, and Osteopenia   Priority: High     Long-Range Goal: Disease mgmt   Start Date: 12/10/2021  Expected End Date: 12/10/2022  This Visit's Progress: On track  Priority: High  Note:    Current Barriers:  Unable to independently monitor therapeutic efficacy  Pharmacist Clinical Goal(s):  Patient will achieve adherence to monitoring guidelines and medication adherence to achieve therapeutic efficacy through collaboration with PharmD and provider.   Interventions: 1:1 collaboration with Ria Bush, MD regarding development and update of comprehensive plan of care as evidenced by provider attestation and co-signature Inter-disciplinary care team collaboration (see longitudinal plan of care) Comprehensive medication review performed; medication list updated in electronic medical record  Hypertension (BP goal <140/90) -Controlled - pt endorses compliance with medication; she is not checking BP at home -Current treatment: Losartan 100 mg daily  -Denies hypotensive/hypertensive symptoms -Educated on BP goals and benefits of medications for prevention of heart attack, stroke and kidney damage; Daily salt intake goal < 2300 mg; Importance of home blood pressure monitoring; -Counseled to monitor BP at home periodically, document, and provide log at future appointments -Recommended to continue  current medication  Hyperlipidemia: (LDL goal < 130) -Controlled without medication - given age and lack of ASCVD, no statin is indicated -Current treatment: Nitrostat SL 0.4 mg PRN (Rx 2014) - never used -Educated on Cholesterol goals;  -Counseled on diet and exercise extensively  Osteopenia (Goal prevent fractures) -Controlled - pt endorses compliance with alendronate -Hx breast cancer Fall 2021; on exemestane for a few months -Last DEXA Scan: 10/31/20   T-Score total hip: -2.3  T-Score lumbar spine: -1.5  10-year probability of major osteoporotic fracture: 24.4%  10-year probability of hip fracture: 7.7% -Patient is a candidate for pharmacologic treatment due to T-Score -1.0 to -2.5 and 10-year risk of major osteoporotic fracture > 20% and T-Score -1.0 to -2.5 and 10-year risk of hip fracture > 3% -Current treatment  Alendronate 70 mg weekly Sundays (2013-2016, 2021-) Calcium 600 mg w/ Vit D 800  Vitamin D 2000 IU -Recommend weight-bearing and muscle strengthening exercises for building and maintaining bone density. -Recommended to continue current medication  Hypothyroidism (Goal: maintain TSH in goal range) -Controlled - TSH is at goal; pt reports taking levothyroxine half hour before breakfast -Current treatment  Levothyroxine 75 mcg daily -Recommended to continue current medication  Osteoarthritis (Goal: manage pain) -Controlled - pt reports knee pain is relatively controlled with current regimen, she is awaiting knee surgery next year -planning TKA 02/2022 -Current treatment  Voltaren 1% gel Celecoxib 200 mg daily -Recommended to continue current medication  Glaucoma (Goal: maintain IOP in goal range) -Controlled -Current treatment  Latanoprost 0.005% eye drops -Recommended to continue current medication  Eczema/Psoriasis (Goal: manage symptoms) -Controlled - per pt report -pt does not use clobetasol and triamcinolone together; she has skin thinning and using  triamcinolone on those areas -Current treatment  Ciclopirox 0.77% gel Clobetasol 0.05% cream Silvadene 1% cream Tacrolimus 0.1% ointment +Triamcinolone +Desonide Cerave -Medications previously tried: clotrimazole, fluocinonide, fluticasone   -Recommended to continue current medication  Health Maintenance -Vaccine gaps: Flu, Shingrix -Advised flu shot -Hx Breast cancer. Dx 07/2020 (2 masses). S/p Lumpectomy 08/2020. S/p Mastectomy 05/2021. No endocrine therapy (anastrozole, exemestane not tolerated) -Current therapy:  Tums PRN Magnesium complex 400 mg Vitamin  C 500 mg Vitamin E 180 mg Selenium Multivitamin Women 50+ Gingko biloba Benzonatate 100 mg TID prn (10/27/21) -Patient is satisfied with current therapy and denies issues -Recommended to continue current medication  Patient Goals/Self-Care Activities Patient will:  - take medications as prescribed as evidenced by patient report and record review focus on medication adherence by pill box        Medication Assistance: None required.  Patient affirms current coverage meets needs.  Compliance/Adherence/Medication fill history: Care Gaps: None  Star-Rating Drugs: Losartan - LF 09/22/21 x 90 ds; PDC inaccurate in chart (60%)  Patient's preferred pharmacy is:  [id number] [place] [id number]: [phone]: [phone] Uses pill box? Yes Pt endorses 100% compliance  We discussed: Current pharmacy is preferred with insurance plan and patient is satisfied with pharmacy services Patient decided to: Continue current medication management strategy  Care Plan and Follow Up Patient Decision:  Patient agrees to Care Plan and Follow-up.  Plan: Telephone follow up appointment with care management team member scheduled for:  6 months  LCharlene Brooke PharmD, BCACP Clinical Pharmacist [phone]

## 2021-12-08 NOTE — Patient Instructions (Addendum)
Visit Information  Phone number for Pharmacist: [phone]  Thank you for meeting with me to discuss your medications! I look forward to working with you to achieve your health care goals. Below is a summary of what we talked about during the visit:   Goals Addressed             This Visit's Progress    Manage My Medicine       Timeframe:  Long-Range Goal Priority:  Medium Start Date:          12/08/21                   Expected End Date:      12/08/22                 Follow Up Date June 2023   - call for medicine refill 2 or 3 days before it runs out - call if I am sick and can't take my medicine - keep a list of all the medicines I take; vitamins and herbals too - use a pillbox to sort medicine    Why is this important?   These steps will help you keep on track with your medicines.   Notes:         Care Plan : [place]  Updates made by Charlton Haws, RPH since 12/10/2021 12:00 AM     Problem: Hypertension, Hyperlipidemia, Hypothyroidism, and Osteopenia   Priority: High     Long-Range Goal: Disease mgmt   Start Date: 12/10/2021  Expected End Date: 12/10/2022  This Visit's Progress: On track  Priority: High  Note:    Current Barriers:  Unable to independently monitor therapeutic efficacy  Pharmacist Clinical Goal(s):  Patient will achieve adherence to monitoring guidelines and medication adherence to achieve therapeutic efficacy through collaboration with PharmD and provider.   Interventions: 1:1 collaboration with Ria Bush, MD regarding development and update of comprehensive plan of care as evidenced by provider attestation and co-signature Inter-disciplinary care team collaboration (see longitudinal plan of care) Comprehensive medication review performed; medication list updated in electronic medical record  Hypertension (BP goal <140/90) -Controlled - pt endorses compliance with medication; she is not checking BP at  home -Current treatment: Losartan 100 mg daily  -Denies hypotensive/hypertensive symptoms -Educated on BP goals and benefits of medications for prevention of heart attack, stroke and kidney damage; Daily salt intake goal < 2300 mg; Importance of home blood pressure monitoring; -Counseled to monitor BP at home periodically, document, and provide log at future appointments -Recommended to continue current medication  Hyperlipidemia: (LDL goal < 130) -Controlled without medication - given age and lack of ASCVD, no statin is indicated -Current treatment: Nitrostat SL 0.4 mg PRN (Rx 2014) - never used -Educated on Cholesterol goals;  -Counseled on diet and exercise extensively  Osteopenia (Goal prevent fractures) -Controlled - pt endorses compliance with alendronate -Hx breast cancer Fall 2021; on exemestane for a few months -Last DEXA Scan: 10/31/20   T-Score total hip: -2.3  T-Score lumbar spine: -1.5  10-year probability of major osteoporotic fracture: 24.4%  10-year probability of hip fracture: 7.7% -Patient is a candidate for pharmacologic treatment due to T-Score -1.0 to -2.5 and 10-year risk of major osteoporotic fracture > 20% and T-Score -1.0 to -2.5 and 10-year risk of hip fracture > 3% -Current treatment  Alendronate 70 mg weekly Sundays (2013-2016, 2021-) Calcium 600 mg w/ Vit D 800  Vitamin D 2000 IU -Recommend weight-bearing and muscle  strengthening exercises for building and maintaining bone density. -Recommended to continue current medication  Hypothyroidism (Goal: maintain TSH in goal range) -Controlled - TSH is at goal; pt reports taking levothyroxine half hour before breakfast -Current treatment  Levothyroxine 75 mcg daily -Recommended to continue current medication  Osteoarthritis (Goal: manage pain) -Controlled - pt reports knee pain is relatively controlled with current regimen, she is awaiting knee surgery next year -planning TKA 02/2022 -Current treatment   Voltaren 1% gel Celecoxib 200 mg daily -Recommended to continue current medication  Glaucoma (Goal: maintain IOP in goal range) -Controlled -Current treatment  Latanoprost 0.005% eye drops -Recommended to continue current medication  Eczema/Psoriasis (Goal: manage symptoms) -Controlled - per pt report -pt does not use clobetasol and triamcinolone together; she has skin thinning and using triamcinolone on those areas -Current treatment  Ciclopirox 0.77% gel Clobetasol 0.05% cream Silvadene 1% cream Tacrolimus 0.1% ointment +Triamcinolone +Desonide Cerave -Medications previously tried: clotrimazole, fluocinonide, fluticasone   -Recommended to continue current medication  Health Maintenance -Vaccine gaps: Flu, Shingrix -Advised flu shot -Hx Breast cancer. Dx 07/2020 (2 masses). S/p Lumpectomy 08/2020. S/p Mastectomy 05/2021. No endocrine therapy (anastrozole, exemestane not tolerated) -Current therapy:  Tums PRN Magnesium complex 400 mg Vitamin C 500 mg Vitamin E 180 mg Selenium Multivitamin Women 50+ Gingko biloba Benzonatate 100 mg TID prn (10/27/21) -Patient is satisfied with current therapy and denies issues -Recommended to continue current medication  Patient Goals/Self-Care Activities Patient will:  - take medications as prescribed as evidenced by patient report and record review focus on medication adherence by pill box       Ms. Hollan was given information about Chronic Care Management services today including:  CCM service includes personalized support from designated clinical staff supervised by her physician, including individualized plan of care and coordination with other care providers 24/7 contact phone numbers for assistance for urgent and routine care needs. Standard insurance, coinsurance, copays and deductibles apply for chronic care management only during months in which we provide at least 20 minutes of these services. Most insurances cover these  services at 100%, however patients may be responsible for any copay, coinsurance and/or deductible if applicable. This service may help you avoid the need for more expensive face-to-face services. Only one practitioner may furnish and bill the service in a calendar month. The patient may stop CCM services at any time (effective at the end of the month) by phone call to the office staff.  Patient agreed to services and verbal consent obtained.   The patient verbalized understanding of instructions, educational materials, and care plan provided today and declined offer to receive copy of patient instructions, educational materials, and care plan.  Telephone follow up appointment with pharmacy team member scheduled for: 6 months  Charlene Brooke, PharmD, [facility] Clinical Pharmacist [facility] [phone]

## 2021-12-09 ENCOUNTER — Ambulatory Visit: Payer: Medicare Other

## 2021-12-09 ENCOUNTER — Ambulatory Visit (INDEPENDENT_AMBULATORY_CARE_PROVIDER_SITE_OTHER): Payer: Medicare Other | Admitting: Psychology

## 2021-12-09 DIAGNOSIS — M79672 Pain in left foot: Secondary | ICD-10-CM | POA: Diagnosis not present

## 2021-12-09 DIAGNOSIS — R262 Difficulty in walking, not elsewhere classified: Secondary | ICD-10-CM

## 2021-12-09 DIAGNOSIS — F4323 Adjustment disorder with mixed anxiety and depressed mood: Secondary | ICD-10-CM | POA: Diagnosis not present

## 2021-12-09 DIAGNOSIS — M542 Cervicalgia: Secondary | ICD-10-CM | POA: Diagnosis not present

## 2021-12-09 NOTE — Progress Notes (Signed)
Beth Rodgers, MRN: [id number]    Date: 12/09/21  Time Spent: 1:01  pm - 1:58 pm : 57 Minutes  Treatment Type: Individual Therapy.  Reported Symptoms: Disturbed sleep, nighttime rumination.   Mental Status Exam: Appearance:  NA     Behavior: Appropriate  Motor: NA  Speech/Language:  Clear and Coherent and Normal Rate  Affect: Congruent  Mood: dysthymic  Thought process: normal  Thought content:   WNL  Sensory/Perceptual disturbances:   WNL  Orientation: oriented to person, place, time/date, and situation  Attention: Good  Concentration: Good  Memory: WNL  Fund of knowledge:  Good  Insight:   Good  Judgment:  Good  Impulse Control: Good   Risk Assessment: Danger to Self:  No Self-injurious Behavior: No Danger to Others: No Duty to Warn:no Physical Aggression / Violence:No  Access to Firearms a concern: No  Gang Involvement:No   Subjective:   Beth Rodgers participated from home, via phone, and consented to treatment. Therapist participated from office. We met online due to [place] pandemic. Beth Rodgers reviewed the events of the past two  week.  He discussed an increase in interpersonal stressors with her son Beth Rodgers and noted his negative reaction to being asked to complete a simple chore work in the home.  She noted her frustration regarding this and Beth Rodgers's inability or lack of care to participate in the hospital.  We worked on feeling identification Beth Rodgers noted feeling "taken advantage of" by Beth Rodgers due to his lack of investment.  We processed this experience and worked on identifying boundaries.  Additionally, Beth Rodgers endorsed disturbed sleep specifically middle insomnia with rumination, regarding her grandchildren's whereabouts and specifically her grandson's current health status.  Beth Rodgers noted her self-talk regarding this topic and noted feeling a sense of responsibility to have someone come to the aid of her  grandson.  She endorsed significant anxiety regarding his health due to the past history of health complications and his mother's lack of adherence to medical treatment recommendations by his provider.  Beth Rodgers noted that this was reported to CPS previously and investigation is ongoing.  Finally came noted financial stressors as a result of fluctuating market and noted her husband's level of stress as a result of his attempts to manage the family budget. During the session we worked on identifying boundaries for others, employed ACT principles to identify areas of control and lack of control, and reviewed the importance of boundaries for self. Beth Rodgers was engaged and motivated in the session and expressed her commitment towards her goals.  We will continue to process her self talk to her follow-up appointment.  Therapist validated Beth Rodgers experienced feelings and provided supportive therapy.   Interventions: Assertiveness/Communication, Interpersonal, and ACT.  Diagnosis: Adjustment disorder with mixed anxiety and depressed mood   Plan: Patient is to use CBT, BA, Problem-solving, mindfulness and coping skills to help manage decrease symptoms associated with their diagnosis. (Target date: 08/16/22)   Long-term goal:   Reduce overall level, frequency, and intensity of the feelings of depression and anxiety evidenced by   decreased sadness, rumination, lethargy, and interpersonal stressors  from 6 to 7 days/week to 0 to 1 days/week per client report for at least 3 consecutive months.  Short-term goal:  Verbally express understanding of the relationship between feelings of depression, anxiety and their impact on thinking patterns and behaviors. Verbalize an understanding of the role that distorted thinking plays in creating fears, excessive worry, and ruminations. Engage  in enjoyable activities on a consistent basis.  [name], LCSW

## 2021-12-09 NOTE — Therapy (Signed)
[facility] [address], [place], [address] Phone: [phone]   Fax:  [phone]  Physical Therapy Treatment  Patient Details  Name: Beth Rodgers MRN: [id number] Date of Birth: 01/31/1935 Referring Provider (PT): Owens Loffler, MD   Encounter Date: 12/09/2021   PT End of Session - 12/09/21 1503     Visit Number 22    Number of Visits 33    Date for PT Re-Evaluation 12/25/21    Authorization Type Medicare    Progress Note Due on Visit 32    PT Start Time 72    PT Stop Time 7253    PT Time Calculation (min) 44 min    Activity Tolerance Patient tolerated treatment well    Behavior During Therapy [facility] for tasks assessed/performed             Past Medical History:  Diagnosis Date   Basal cell carcinoma ([place])    txted with MOHs in past   Cellulitis 08/10/2018   Chronic venous insufficiency    with varicose veins   Family history of breast cancer    Family history of thyroid cancer    GERD (gastroesophageal reflux disease)    Glaucoma    History  of basal cell carcinoma    left nare/BREAST CANCER   History of chicken pox    Hypertension    Hypoglycemia    Hypothyroid    Osteoarthritis    back pain, L knee pain   Osteopenia 06/2009   DEXA 11/2015: T -2.3 hip, -2.2 spine, on longterm bisphosphonate/evista   Psoriasis    Pyogenic granuloma 04/16/2016    Past Surgical History:  Procedure Laterality Date   BASAL CELL CARCINOMA EXCISION     located on face   BREAST BIOPSY Right    core done ing [place]?   BREAST BIOPSY Left 08/16/2020   3 area bx 4:00 X, IMC, 6:00Q IMC, LN hydro #3-benign   BREAST LUMPECTOMY Left 09/11/2020   two areas of [facility]   CATARACT EXTRACTION     bilateral   DEXA  06/2009   T score Spine: -1.8, hip -2.0   EXCISION OF BREAST BIOPSY Left 09/11/2020   Procedure: EXCISION OF BREAST BIOPSY;  Surgeon: [name], MD;  Location: [facility] ORS;  Service:  General;  Laterality: Left;   Foam sclerotherapy Bilateral 09/2015   [place]ed [place], [place]   MASTECTOMY W/ SENTINEL NODE BIOPSY Left 06/25/2021   Procedure: MASTECTOMY WITH SENTINEL LYMPH NODE BIOPSY;  Surgeon: [name], MD;  Location: [facility] ORS;  Service: General;  Laterality: Left;   NASAL RECONSTRUCTION  2005   for [place] L nare s/p Mohs   nuclear stress test  2014   normal in [place] BX Left 09/11/2020   Procedure: PARTIAL MASTECTOMY WITH NEEDLE LOCALIZATION AND AXILLARY SENTINEL LYMPH NODE [place];  Surgeon: [name], MD;  Location: [facility] ORS;  Service: General;  Laterality: Left;   RADIOFREQUENCY ABLATION Left 01/2012   remnant L G saphenous vein below knee   RADIOFREQUENCY ABLATION Right 02/2013   RLE vein ablation    VEIN LIGATION AND STRIPPING  remote   bilateral G saphenous veins    There were no vitals filed for this visit.   Subjective Assessment - 12/09/21 1505     Subjective L knee and L heel are both improving. L heel has a little discomfort, about a 1/10, L knee  is more comfortable, no pain.    Pertinent History Neck and achilles pain. L knee also bothers her. Had injection L knee which helped 10 days before her breast surgery June 25, 2021. Dr. [name] also states Pt needs surgery for her L knee (TKA). Pt however has a heel spur on L foot which is chronic. Went to 3 specialists for heel spur and was told that there is nothing can be done surgically. The heel pain however affects her gait which affects her L knee increasing L knee pain. Pt continues to do her HEP from previous PT. Neck pain began about 6 months ago gradually. Started feeling tightness from R medial shoulder blade to neck. Pt is R hand dominant. L foot bothers her more currently.    Currently in Pain? Yes    Pain Score 1                                         PT Education -  12/09/21 1542     Education Details ther-ex    Person(s) Educated Patient    Methods Explanation;Demonstration;Tactile cues;Verbal cues    Comprehension Returned demonstration;Verbalized understanding            Objective     No latex band allergies   B varicose veins legs Osteopenia     L heel pain Hx of L mastectomy on June 25, 2021 and had home health PT for recovery.      Started using a SPC 5 months ago when ambulating by herself. Furniture walk at home.    Medbridge Access Code [email or web address]     Manual therapy  Seated STM/cross friction technique L heel/Achilles area to promote blood flow as well as decrease fascial restrictions.    Seated STM L plantar foot to decrease fascial restrictions  Seated STM L lateral hamstrings to decrease tension and improve L knee and ankle mechanics.    Seated STM to anterior knee to decrease fascial restrictions    Supine medial glide L leg grade 1 for pain control of L knee to promote better L foot and heel mechanics during gait.         Therapeutic exercise  Seated manually resisted L ankle PF isometrics 40% effort, PT resistance 1 min x 5  Hooklying manually reisted hip abduction isometrics, leg straight              L 10x with 5 second holds   Hooklying hip extension leg straight to promote glute max strength             L 10x with 5 second holds        Improved exercise technique, movement at target joints, use of target muscles after min to mod verbal, visual, tactile cues.        Response to treatment Pt tolerated session well without aggravation of symptoms.         Clinical Impression Decreasing L knee and heel pain  based on subjective reports. Continued working on decreasing soft tissue restrictions to L heel and knee and improving L glute strength to promote better mechanics during closed chain tasks. Improved L LE femoral control with cues. No L knee and heel pain during gait reported after session. Pt will  benefit from continued skilled physical therapy services to decrease pain, improve strength and function.       PT Short Term  Goals - 10/09/21 1422       PT SHORT TERM GOAL #1   Title Patient will be independent with her initial HEP to improve strength, decrease neck and L heel pain, improve function.    Baseline Does her HEP, no questions (10/09/2021)    Time 3    Period Weeks    Status Achieved    Target Date 09/25/21               PT Long Term Goals - 12/02/21 1401       PT LONG TERM GOAL #1   Title Pt will have a decrease in L heel pain to 4/10 or less at worst to promote ability to ambulate, perform standing tasks more comfortably.    Baseline 8/10 L heel/Achilles pain at worst for the past 3 months (09/04/2021);  5/10 at worst (10/07/2021); 4/10 at most for the past 7 days, most of the time, 2/10, worst is getting up in the middle of the night to go to the bathroom (10/29/2021), 2/10 at most for the pst 7 days (12/02/2021)    Time 8    Period Weeks    Status Achieved    Target Date 10/30/21      PT LONG TERM GOAL #2   Title Pt will improve bilateral hip extension and abduction strength by at least 1/2 MMT grade to promote femoral control and ability to ambulate and perform closed chain tasks with less L heel/Achilles pain.    Baseline Hip extension 4-/5 R, 3+/5 L, hip abduction 3-/5 R, 4-/5 L (09/04/2021); 4+/5 R, 4/5 L, hip abduction 4-/5 R, 4/5 L (L knee pain) 10/09/2021; Hip extension 4+/5 R, 4/5 L. Hip abduction in S/L  not tested secondary to L knee pain  (10/29/2021)    Time 8    Period Weeks    Status Partially Met    Target Date 12/25/21      PT LONG TERM GOAL #3   Title Pt will improve her L foot FOTO score by at least 10 points as a demonstration of improved function.    Baseline L foot FOTO 38 (09/04/2021); 43 (10/29/2021); 53 (12/02/2021)    Time 8    Period Weeks    Status Achieved    Target Date 12/25/21      PT LONG TERM GOAL #4   Title Pt will have a  decrease in neck pain to 2/10 or less at worst to promote ability to look around more comfortably.    Baseline 5/10 neck pain at worst for the past 3 months (09/04/2021), (10/07/2021), 1.5/10 at most for the past 7 day (10/29/2021)    Time 8    Period Weeks    Status Achieved    Target Date 10/30/21      PT LONG TERM GOAL #5   Title Patient will have a decrease in L knee pain to 2/10 or less at worst promote better mechanics for L heel during gait.    Baseline 4/10 L knee pain at most for the past 3 weeks (12/02/2021)    Time 3    Period Weeks    Status New    Target Date 12/25/21                   Plan - 12/09/21 1503     Clinical Impression Statement Decreasing L knee and heel pain  based on subjective reports. Continued working on decreasing soft tissue restrictions to L heel and knee  and improving L glute strength to promote better mechanics during closed chain tasks. Improved L LE femoral control with cues. No L knee and heel pain during gait reported after session. Pt will benefit from continued skilled physical therapy services to decrease pain, improve strength and function.    Personal Factors and Comorbidities Age;Comorbidity 3+;Time since onset of injury/illness/exacerbation;Fitness    Comorbidities Osteopenia, chronic venous insufficiency,Hx of basal cell CA S/P lumpectomy, HTN    Examination-Activity Limitations Stand;Locomotion Level;Squat;Stairs;Carry;Lift    Stability/Clinical Decision Making Stable/Uncomplicated    Clinical Decision Making Low    Rehab Potential Fair    Clinical Impairments Affecting Rehab Potential Chronicity of condition, age, fitness level    PT Frequency 2x / week    PT Duration 8 weeks    PT Treatment/Interventions Aquatic Therapy;Electrical Stimulation;Iontophoresis 4mg /ml Dexamethasone;Gait training;Stair training;Functional mobility training;Therapeutic activities;Therapeutic exercise;Balance training;Neuromuscular  re-education;Patient/family education;Manual techniques;Dry needling    PT Next Visit Plan isometric plantar flexion, glute, trunk, scapular strengthening, femoral control, manual techniques, modalities PRN    PT Home Exercise Plan no changes today    Consulted and Agree with Plan of Care Patient             Patient will benefit from skilled therapeutic intervention in order to improve the following deficits and impairments:  Postural dysfunction, Decreased strength, Improper body mechanics, Pain, Difficulty walking  Visit Diagnosis: Difficulty in walking, not elsewhere classified  Pain in left foot     Problem List Patient Active Problem List   Diagnosis Date Noted   Acute respiratory infection 10/06/2021   Pre-op evaluation 06/02/2021   Right wrist pain 02/28/2021   Skin rash 02/28/2021   Ascending aorta dilatation (HCC) 01/10/2021   Left-sided chest pain 01/10/2021   Genetic testing 10/30/2020   Malignant neoplasm of overlapping sites of left breast in female, estrogen receptor positive ([place]) 10/22/2020   Family history of breast cancer    Family history of thyroid cancer    Insertional Achilles tendinopathy 08/28/2020   Breast cancer, left breast ([place]) 08/01/2020   Right carotid bruit 06/05/2020   Chest pressure 06/03/2020   Left Achilles tendinitis 09/01/2019   Low back pain 08/29/2019   Lumbosacral spondylosis without myelopathy 08/29/2019   Calcaneal spur 08/17/2019   Contusion of knee 10/04/2018   Varicose veins of bilateral lower extremities with pain 03/30/2018   Lymphedema 03/30/2018   Dizziness 02/02/2017   Situational depression 12/18/2016   Vitamin D deficiency 10/11/2016   Fatigue 08/12/2016   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 04/14/2016   Essential hypertension 12/17/2015   Dermatitis of external ear 12/17/2015   [place] of foot 05/16/2015   Advanced care planning/counseling discussion 10/30/2014   Neck pain on right side  06/25/2014   Primary osteoarthritis of left knee 08/17/2013   Oropharyngeal dysphagia 11/15/2012   Stressful life events affecting family and household 03/30/2012   Medicare annual wellness visit, subsequent 03/30/2012   History of basal cell carcinoma    Osteoarthritis    Osteopenia    Hypothyroidism    Glaucoma    Chronic venous insufficiency     Joneen Boers PT, DPT   12/09/2021, 3:59 PM  [facility] [address] [place], [address] Phone: [phone]   Fax:  [phone]  Name: Beth Rodgers MRN: [id number] Date of Birth: 1935-11-27

## 2021-12-11 ENCOUNTER — Ambulatory Visit: Payer: Medicare Other

## 2021-12-11 DIAGNOSIS — M79672 Pain in left foot: Secondary | ICD-10-CM

## 2021-12-11 DIAGNOSIS — R262 Difficulty in walking, not elsewhere classified: Secondary | ICD-10-CM

## 2021-12-11 DIAGNOSIS — M542 Cervicalgia: Secondary | ICD-10-CM | POA: Diagnosis not present

## 2021-12-11 NOTE — Therapy (Signed)
[facility] [address], [place], [address] Phone: [phone]   Fax:  [phone]  Physical Therapy Treatment  Patient Details  Name: Beth Rodgers MRN: [id number] Date of Birth: 22-Jan-1935 Referring Provider (PT): Owens Loffler, MD   Encounter Date: 12/11/2021   PT End of Session - 12/11/21 1333     Visit Number 23    Number of Visits 33    Date for PT Re-Evaluation 12/25/21    Authorization Type Medicare    Progress Note Due on Visit 20    PT Start Time 1333    PT Stop Time 1414    PT Time Calculation (min) 41 min    Activity Tolerance Patient tolerated treatment well    Behavior During Therapy [facility] for tasks assessed/performed             Past Medical History:  Diagnosis Date   Basal cell carcinoma ([place])    txted with MOHs in past   Cellulitis 08/10/2018   Chronic venous insufficiency    with varicose veins   Family history of breast cancer    Family history of thyroid cancer    GERD (gastroesophageal reflux disease)    Glaucoma    History  of basal cell carcinoma    left nare/BREAST CANCER   History of chicken pox    Hypertension    Hypoglycemia    Hypothyroid    Osteoarthritis    back pain, L knee pain   Osteopenia 06/2009   DEXA 11/2015: T -2.3 hip, -2.2 spine, on longterm bisphosphonate/evista   Psoriasis    Pyogenic granuloma 04/16/2016    Past Surgical History:  Procedure Laterality Date   BASAL CELL CARCINOMA EXCISION     located on face   BREAST BIOPSY Right    core done ing [place]?   BREAST BIOPSY Left 08/16/2020   3 area bx 4:00 X, IMC, 6:00Q IMC, LN hydro #3-benign   BREAST LUMPECTOMY Left 09/11/2020   two areas of [facility]   CATARACT EXTRACTION     bilateral   DEXA  06/2009   T score Spine: -1.8, hip -2.0   EXCISION OF BREAST BIOPSY Left 09/11/2020   Procedure: EXCISION OF BREAST BIOPSY;  Surgeon: [name], MD;  Location: [facility] ORS;  Service:  General;  Laterality: Left;   Foam sclerotherapy Bilateral 09/2015   Reed Pandy, [place]   MASTECTOMY W/ SENTINEL NODE BIOPSY Left 06/25/2021   Procedure: MASTECTOMY WITH SENTINEL LYMPH NODE BIOPSY;  Surgeon: [name], MD;  Location: [facility] ORS;  Service: General;  Laterality: Left;   NASAL RECONSTRUCTION  2005   for [place] L nare s/p Mohs   nuclear stress test  2014   normal in [place] BX Left 09/11/2020   Procedure: PARTIAL MASTECTOMY WITH NEEDLE LOCALIZATION AND AXILLARY SENTINEL LYMPH NODE [place];  Surgeon: [name], MD;  Location: [facility] ORS;  Service: General;  Laterality: Left;   RADIOFREQUENCY ABLATION Left 01/2012   remnant L G saphenous vein below knee   RADIOFREQUENCY ABLATION Right 02/2013   RLE vein ablation    VEIN LIGATION AND STRIPPING  remote   bilateral G saphenous veins    There were no vitals filed for this visit.   Subjective Assessment - 12/11/21 1334     Subjective L heel is a little pain today, 1/10 currently. L knee has not hurt. L knee buckled however yesterday,  doctor said that that would happen.    Pertinent History Neck and achilles pain. L knee also bothers her. Had injection L knee which helped 10 days before her breast surgery June 25, 2021. Dr. [name] also states Pt needs surgery for her L knee (TKA). Pt however has a heel spur on L foot which is chronic. Went to 3 specialists for heel spur and was told that there is nothing can be done surgically. The heel pain however affects her gait which affects her L knee increasing L knee pain. Pt continues to do her HEP from previous PT. Neck pain began about 6 months ago gradually. Started feeling tightness from R medial shoulder blade to neck. Pt is R hand dominant. L foot bothers her more currently.    Currently in Pain? Yes    Pain Score 1     Pain Orientation Left                                         PT Education - 12/11/21 1409     Education Details ther-ex    Person(s) Educated Patient    Methods Explanation;Demonstration;Tactile cues;Verbal cues    Comprehension Returned demonstration;Verbalized understanding            Objective     No latex band allergies   B varicose veins legs Osteopenia     L heel pain Hx of L mastectomy on June 25, 2021 and had home health PT for recovery.      Started using a SPC 5 months ago when ambulating by herself. Furniture walk at home.    Medbridge Access Code [email or web address]     Manual therapy  Seated STM/cross friction technique L heel/Achilles area to promote blood flow as well as decrease fascial restrictions.    Seated STM L plantar foot to decrease fascial restrictions   Seated STM L lateral hamstrings to decrease tension and improve L knee and ankle mechanics.      Supine medial glide L leg grade 1 for pain control of L knee to promote better L foot and heel mechanics during gait.         Therapeutic exercise  Seated manually resisted L ankle PF isometrics 40% effort, PT resistance 1 min x 5  Standing mini heel raisis with B UE assist 2x. Uncomfortable.    Hooklying hip extension leg straight to promote glute max strength             L 10x with 5 second holds     Hooklying manually reisted hip abduction isometrics, leg straight              L 10x with 5 second holds       Improved exercise technique, movement at target joints, use of target muscles after min to mod verbal, visual, tactile cues.        Response to treatment Pt tolerated session well without aggravation of symptoms.         Clinical Impression Pt making very good progress with decreased L knee pain based on subjective reports. Continued working on decreasing soft tissue restrictions to L heel and knee and improving L glute strength to promote better mechanics during closed chain tasks.  Pt  will benefit from continued skilled physical therapy services to decrease pain, improve strength and function.      PT Short Term Goals - 10/09/21  [place] #1   Title Patient will be independent with her initial HEP to improve strength, decrease neck and L heel pain, improve function.    Baseline Does her HEP, no questions (10/09/2021)    Time 3    Period Weeks    Status Achieved    Target Date 09/25/21               PT Long Term Goals - 12/02/21 1401       PT LONG TERM GOAL #1   Title Pt will have a decrease in L heel pain to 4/10 or less at worst to promote ability to ambulate, perform standing tasks more comfortably.    Baseline 8/10 L heel/Achilles pain at worst for the past 3 months (09/04/2021);  5/10 at worst (10/07/2021); 4/10 at most for the past 7 days, most of the time, 2/10, worst is getting up in the middle of the night to go to the bathroom (10/29/2021), 2/10 at most for the pst 7 days (12/02/2021)    Time 8    Period Weeks    Status Achieved    Target Date 10/30/21      PT LONG TERM GOAL #2   Title Pt will improve bilateral hip extension and abduction strength by at least 1/2 MMT grade to promote femoral control and ability to ambulate and perform closed chain tasks with less L heel/Achilles pain.    Baseline Hip extension 4-/5 R, 3+/5 L, hip abduction 3-/5 R, 4-/5 L (09/04/2021); 4+/5 R, 4/5 L, hip abduction 4-/5 R, 4/5 L (L knee pain) 10/09/2021; Hip extension 4+/5 R, 4/5 L. Hip abduction in S/L  not tested secondary to L knee pain  (10/29/2021)    Time 8    Period Weeks    Status Partially Met    Target Date 12/25/21      PT LONG TERM GOAL #3   Title Pt will improve her L foot FOTO score by at least 10 points as a demonstration of improved function.    Baseline L foot FOTO 38 (09/04/2021); 43 (10/29/2021); 53 (12/02/2021)    Time 8    Period Weeks    Status Achieved    Target Date 12/25/21      PT LONG TERM GOAL #4   Title Pt will have a  decrease in neck pain to 2/10 or less at worst to promote ability to look around more comfortably.    Baseline 5/10 neck pain at worst for the past 3 months (09/04/2021), (10/07/2021), 1.5/10 at most for the past 7 day (10/29/2021)    Time 8    Period Weeks    Status Achieved    Target Date 10/30/21      PT LONG TERM GOAL #5   Title Patient will have a decrease in L knee pain to 2/10 or less at worst promote better mechanics for L heel during gait.    Baseline 4/10 L knee pain at most for the past 3 weeks (12/02/2021)    Time 3    Period Weeks    Status New    Target Date 12/25/21                   Plan - 12/11/21 1333     Clinical Impression Statement Pt making very good progress with decreased L knee pain based on subjective reports. Continued working on decreasing soft tissue restrictions to L heel and knee  and improving L glute strength to promote better mechanics during closed chain tasks.  Pt will benefit from continued skilled physical therapy services to decrease pain, improve strength and function.    Personal Factors and Comorbidities Age;Comorbidity 3+;Time since onset of injury/illness/exacerbation;Fitness    Comorbidities Osteopenia, chronic venous insufficiency,Hx of basal cell CA S/P lumpectomy, HTN    Examination-Activity Limitations Stand;Locomotion Level;Squat;Stairs;Carry;Lift    Stability/Clinical Decision Making Stable/Uncomplicated    Clinical Decision Making Low    Rehab Potential Fair    Clinical Impairments Affecting Rehab Potential Chronicity of condition, age, fitness level    PT Frequency 2x / week    PT Duration 8 weeks    PT Treatment/Interventions Aquatic Therapy;Electrical Stimulation;Iontophoresis 78m/ml Dexamethasone;Gait training;Stair training;Functional mobility training;Therapeutic activities;Therapeutic exercise;Balance training;Neuromuscular re-education;Patient/family education;Manual techniques;Dry needling    PT Next Visit Plan isometric  plantar flexion, glute, trunk, scapular strengthening, femoral control, manual techniques, modalities PRN    PT Home Exercise Plan no changes today    Consulted and Agree with Plan of Care Patient             Patient will benefit from skilled therapeutic intervention in order to improve the following deficits and impairments:  Postural dysfunction, Decreased strength, Improper body mechanics, Pain, Difficulty walking  Visit Diagnosis: Difficulty in walking, not elsewhere classified  Pain in left foot     Problem List Patient Active Problem List   Diagnosis Date Noted   Acute respiratory infection 10/06/2021   Pre-op evaluation 06/02/2021   Right wrist pain 02/28/2021   Skin rash 02/28/2021   Ascending aorta dilatation (HCC) 01/10/2021   Left-sided chest pain 01/10/2021   Genetic testing 10/30/2020   Malignant neoplasm of overlapping sites of left breast in female, estrogen receptor positive ([place] 10/22/2020   Family history of breast cancer    Family history of thyroid cancer    Insertional Achilles tendinopathy 08/28/2020   [place] cancer, [place] breast ([place] 08/01/2020   Right carotid bruit 06/05/2020   Chest pressure 06/03/2020   Left Achilles tendinitis 09/01/2019   Low back pain 08/29/2019   Lumbosacral spondylosis without myelopathy 08/29/2019   Calcaneal spur 08/17/2019   Contusion of knee 10/04/2018   Varicose veins of bilateral lower extremities with pain 03/30/2018   Lymphedema 03/30/2018   Dizziness 02/02/2017   Situational depression 12/18/2016   Vitamin D deficiency 10/11/2016   Fatigue 08/12/2016   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 04/14/2016   Essential hypertension 12/17/2015   Dermatitis of external ear 12/17/2015   [place] of foot 05/16/2015   Advanced care planning/counseling discussion 10/30/2014   Neck pain on right side 06/25/2014   Primary osteoarthritis of left knee 08/17/2013   Oropharyngeal dysphagia 11/15/2012    Stressful life events affecting family and household 03/30/2012   Medicare annual wellness visit, subsequent 03/30/2012   History of basal cell carcinoma    Osteoarthritis    Osteopenia    Hypothyroidism    Glaucoma    Chronic venous insufficiency     MJoneen BoersPT, DPT   12/11/2021, 4:07 PM  [facility] [address] [place] [address]Phone: [phone]  Fax:  [phone] Name: Beth FIORENZAMRN: [id number] of Birth: 512/25/1936

## 2021-12-12 ENCOUNTER — Ambulatory Visit (INDEPENDENT_AMBULATORY_CARE_PROVIDER_SITE_OTHER): Payer: Medicare Other | Admitting: Nurse Practitioner

## 2021-12-12 ENCOUNTER — Telehealth (INDEPENDENT_AMBULATORY_CARE_PROVIDER_SITE_OTHER): Payer: Self-pay

## 2021-12-12 ENCOUNTER — Other Ambulatory Visit: Payer: Self-pay

## 2021-12-12 VITALS — BP 159/76 | HR 83 | Ht 66.0 in | Wt 172.0 lb

## 2021-12-12 DIAGNOSIS — I890 Lymphedema, not elsewhere classified: Secondary | ICD-10-CM

## 2021-12-12 NOTE — Progress Notes (Signed)
History of Present Illness  There is no documented history at this time  Assessments & Plan   There are no diagnoses linked to this encounter.    Additional instructions  Subjective:  Patient presents with venous ulcer of the Bilateral lower extremity.    Procedure:  3 layer unna wrap was placed Bilateral lower extremity.   Plan:   Follow up in one week. Added xeroform form moisture 2 to both legs

## 2021-12-12 NOTE — Telephone Encounter (Signed)
Patient left a voicemail requested to seen for bilateral leg weeping. Patient is schedule to come in today for unna wraps.

## 2021-12-14 ENCOUNTER — Encounter (INDEPENDENT_AMBULATORY_CARE_PROVIDER_SITE_OTHER): Payer: Self-pay | Admitting: Nurse Practitioner

## 2021-12-15 ENCOUNTER — Telehealth (INDEPENDENT_AMBULATORY_CARE_PROVIDER_SITE_OTHER): Payer: Self-pay | Admitting: Vascular Surgery

## 2021-12-15 ENCOUNTER — Telehealth (INDEPENDENT_AMBULATORY_CARE_PROVIDER_SITE_OTHER): Payer: Self-pay

## 2021-12-15 ENCOUNTER — Other Ambulatory Visit: Payer: Self-pay

## 2021-12-15 ENCOUNTER — Ambulatory Visit (INDEPENDENT_AMBULATORY_CARE_PROVIDER_SITE_OTHER): Payer: Medicare Other | Admitting: Vascular Surgery

## 2021-12-15 ENCOUNTER — Encounter (INDEPENDENT_AMBULATORY_CARE_PROVIDER_SITE_OTHER): Payer: Self-pay | Admitting: Vascular Surgery

## 2021-12-15 VITALS — BP 169/81 | HR 85 | Resp 16 | Wt 170.2 lb

## 2021-12-15 DIAGNOSIS — N6489 Other specified disorders of breast: Secondary | ICD-10-CM | POA: Diagnosis not present

## 2021-12-15 DIAGNOSIS — I10 Essential (primary) hypertension: Secondary | ICD-10-CM

## 2021-12-15 DIAGNOSIS — L97309 Non-pressure chronic ulcer of unspecified ankle with unspecified severity: Secondary | ICD-10-CM | POA: Diagnosis not present

## 2021-12-15 DIAGNOSIS — I83003 Varicose veins of unspecified lower extremity with ulcer of ankle: Secondary | ICD-10-CM

## 2021-12-15 DIAGNOSIS — I872 Venous insufficiency (chronic) (peripheral): Secondary | ICD-10-CM

## 2021-12-15 DIAGNOSIS — E039 Hypothyroidism, unspecified: Secondary | ICD-10-CM

## 2021-12-15 MED ORDER — TRAMADOL HCL 50 MG PO TABS: 50.0000 mg | ORAL_TABLET | Freq: Four times a day (QID) | ORAL | 0 refills | Status: DC | PRN

## 2021-12-15 MED ORDER — CEPHALEXIN 500 MG PO CAPS: 500.0000 mg | ORAL_CAPSULE | Freq: Four times a day (QID) | ORAL | 1 refills | Status: DC

## 2021-12-15 MED ORDER — SILVER SULFADIAZINE 1 % EX CREA: 1.0000 "application " | TOPICAL_CREAM | Freq: Every day | CUTANEOUS | 4 refills | Status: DC

## 2021-12-15 NOTE — Telephone Encounter (Signed)
Patient was made aware with medical advice and verbalized understanding. Patient is schedule to see provider on 01/06/21 and requesting to be seen by provider sooner. Please advise

## 2021-12-15 NOTE — Telephone Encounter (Signed)
Patient called in leaving 5 voicemail's since Friday. Patient is stating that after being seen on Friday that she is needing to come into the office today to see Dr. Carroll Rodgers. She is stating that her leg is still draining and that she needs to see provider to talk and see if this matter is needed for a surgery matter. She states she is feeling worse than before and would like a call asap    Please call and advise

## 2021-12-15 NOTE — Telephone Encounter (Signed)
We can bring her in to change her wraps if she is draining however if she is weeping that significantly, she may need to see her PCP to evaluate need for diuretics

## 2021-12-15 NOTE — Telephone Encounter (Signed)
Already address by previous note

## 2021-12-15 NOTE — Progress Notes (Signed)
MRN : [id number]  Beth Rodgers is a 86 y.o. ([birth year]) female who presents with chief complaint of swelling worse.  History of Present Illness:  The patient returns to the office for followup evaluation regarding leg swelling.  The swelling has persisted and the pain associated with swelling continues. There have not been any interval development of a ulcerations or wounds.  Since the previous visit the patient had Unna boots placed and has noted little if any improvement in the lymphedema and apparent worsening of her ulcers. She has not been using the pump because of pain.  The patient also states she is using elevation during the day but notes that she has multiple responsibility.   No outpatient medications have been marked as taking for the 12/15/21 encounter (Appointment) with Delana Meyer, Dolores Lory, MD.    Past Medical History:  Diagnosis Date   Basal cell carcinoma ([place])    txted with MOHs in past   Cellulitis 08/10/2018   Chronic venous insufficiency    with varicose veins   Family history of breast cancer    Family history of thyroid cancer    GERD (gastroesophageal reflux disease)    Glaucoma    History  of basal cell carcinoma    left nare/BREAST CANCER   History of chicken pox    Hypertension    Hypoglycemia    Hypothyroid    Osteoarthritis    back pain, L knee pain   Osteopenia 06/2009   DEXA 11/2015: T -2.3 hip, -2.2 spine, on longterm bisphosphonate/evista   Psoriasis    Pyogenic granuloma 04/16/2016    Past Surgical History:  Procedure Laterality Date   BASAL CELL CARCINOMA EXCISION     located on face   BREAST BIOPSY Right    core done ing [place]?   BREAST BIOPSY Left 08/16/2020   3 area bx 4:00 X, IMC, 6:00Q IMC, LN hydro #3-benign   BREAST LUMPECTOMY Left 09/11/2020   two areas of [facility]   CATARACT EXTRACTION     bilateral   DEXA  06/2009   T score Spine: -1.8, hip -2.0   EXCISION OF BREAST BIOPSY Left 09/11/2020   Procedure: EXCISION OF  BREAST BIOPSY;  Surgeon: [name], MD;  Location: [facility] ORS;  Service: General;  Laterality: Left;   Foam sclerotherapy Bilateral 09/2015   Reed Pandy, [place]   MASTECTOMY W/ SENTINEL NODE BIOPSY Left 06/25/2021   Procedure: MASTECTOMY WITH SENTINEL LYMPH NODE BIOPSY;  Surgeon: [name], MD;  Location: [facility] ORS;  Service: General;  Laterality: Left;   NASAL RECONSTRUCTION  2005   for [place] L nare s/p Mohs   nuclear stress test  2014   normal in [place] BX Left 09/11/2020   Procedure: PARTIAL MASTECTOMY WITH NEEDLE LOCALIZATION AND AXILLARY SENTINEL LYMPH NODE [place];  Surgeon: [name], MD;  Location: [facility] ORS;  Service: General;  Laterality: Left;   RADIOFREQUENCY ABLATION Left 01/2012   remnant L G saphenous vein below knee   RADIOFREQUENCY ABLATION Right 02/2013   RLE vein ablation    VEIN LIGATION AND STRIPPING  remote   bilateral G saphenous veins    Social History Social History   Tobacco Use   Smoking status: Never   Smokeless tobacco: Never  Vaping Use   Vaping Use: Never used  Substance Use Topics   Alcohol use: Yes    Comment: Occasionally drinks wine   Drug use: No  Family History Family History  Problem Relation Age of Onset   Cancer Mother        thyroid cancer   Heart disease Father        CHF   Diabetes Father    Glaucoma Father    Arthritis Father    Coronary artery disease Maternal Uncle    Bipolar disorder Son    Heart disease Sister        CHF   Stroke Sister    Brain cancer Grandson        astrocytoma   Breast cancer Cousin        dx 68s    Allergies  Allergen Reactions   Anastrozole Other (See Comments)    Back pain    Exemestane     Pain in right hand    Naproxen Hives    Had bumps break out on the back      REVIEW OF SYSTEMS (Negative unless checked)  Constitutional: [] Weight loss  [] Fever  [] Chills Cardiac:  [] Chest pain   [] Chest pressure   [] Palpitations   [] Shortness of breath when laying flat   [] Shortness of breath with exertion. Vascular:  [] Pain in legs with walking   [] Pain in legs at rest  [] History of DVT   [] Phlebitis   [x] Swelling in legs   [] Varicose veins   [] Non-healing ulcers Pulmonary:   [] Uses home oxygen   [] Productive cough   [] Hemoptysis   [] Wheeze  [] COPD   [] Asthma Neurologic:  [] Dizziness   [] Seizures   [] History of stroke   [] History of TIA  [] Aphasia   [] Vissual changes   [] Weakness or numbness in arm   [] Weakness or numbness in leg Musculoskeletal:   [] Joint swelling   [] Joint pain   [] Low back pain Hematologic:  [] Easy bruising  [] Easy bleeding   [] Hypercoagulable state   [] Anemic Gastrointestinal:  [] Diarrhea   [] Vomiting  [] Gastroesophageal reflux/heartburn   [] Difficulty swallowing. Genitourinary:  [] Chronic kidney disease   [] Difficult urination  [] Frequent urination   [] Blood in urine Skin:  [] Rashes   [] Ulcers  Psychological:  [] History of anxiety   []  History of major depression.  Physical Examination  There were no vitals filed for this visit. There is no height or weight on file to calculate BMI. Gen: WD/WN, NAD Head: [place]/AT, No temporalis wasting.  Ear/Nose/Throat: Hearing grossly intact, nares w/o erythema or drainage, pinna without lesions Eyes: PER, EOMI, sclera nonicteric.  Neck: Supple, no gross masses.  No JVD.  Pulmonary:  Good air movement, no audible wheezing, no use of accessory muscles.  Cardiac: RRR, precordium not hyperdynamic. Vascular:  scattered varicosities present bilaterally.  severe venous stasis changes to the legs bilaterally.  3+ hard non-pitting edema with marked erythema.  Multiple superficial ulcers bilaterally with weeping fluid  Vessel Right Left  Radial Palpable Palpable  Gastrointestinal: soft, non-distended. No guarding/no peritoneal signs.  Musculoskeletal: M/S 5/5 throughout.  No deformity.  Neurologic: CN 2-12 intact. Pain  and light touch intact in extremities.  Symmetrical.  Speech is fluent. Motor exam as listed above. Psychiatric: Judgment intact, Mood & affect appropriate for pt's clinical situation. Dermatologic: Venous rashes with multiple ulcers noted.  + changes consistent with cellulitis. Lymph : No lichenification or skin changes of chronic lymphedema.  CBC Lab Results  Component Value Date   WBC 6.3 06/02/2021   HGB 13.9 06/02/2021   HCT 40.9 06/02/2021   MCV 85.7 06/02/2021   PLT 230.0 06/02/2021    BMET    Component  Value Date/Time   NA 139 06/02/2021 1227   K 4.3 06/02/2021 1227   CL 102 06/02/2021 1227   CO2 27 06/02/2021 1227   GLUCOSE 82 06/02/2021 1227   BUN 19 06/02/2021 1227   CREATININE 0.72 06/02/2021 1227   CALCIUM 9.8 06/02/2021 1227   GFRNONAA >60 01/04/2021 0814   GFRAA >60 08/21/2020 1625   CrCl cannot be calculated (Patient's most recent lab result is older than the maximum 21 days allowed.).  COAG No results found for: INR, PROTIME  Radiology MM 3D SCREEN BREAST UNI RIGHT  Result Date: 12/05/2021 CLINICAL DATA:  Screening. Personal history of malignant LEFT mastectomy in June, 2022. EXAM: DIGITAL SCREENING UNILATERAL RIGHT MAMMOGRAM WITH CAD AND TOMOSYNTHESIS TECHNIQUE: Right screening digital craniocaudal and mediolateral oblique mammograms were obtained. Right screening digital breast tomosynthesis was performed. The images were evaluated with computer-aided detection. COMPARISON:  Previous exam(s). ACR Breast Density Category b: There are scattered areas of fibroglandular density. FINDINGS: The patient has had a left mastectomy. There are no findings suspicious for malignancy in the RIGHT breast. IMPRESSION: No mammographic evidence of malignancy. A result letter of this screening mammogram will be mailed directly to the patient. RECOMMENDATION: Screening mammogram in one year.  (Code:SM-R-13M) BI-RADS CATEGORY  1: Negative. Electronically Signed   By: Evangeline Dakin M.D.   On: 12/05/2021 16:11  MM Outside Films Mammo  Result Date: 11/27/2021 This examination belongs to an outside facility and is stored here for comparison purposes only.  Contact the originating outside institution for any associated report or interpretation.  MM Outside Films Mammo  Result Date: 11/27/2021 This examination belongs to an outside facility and is stored here for comparison purposes only.  Contact the originating outside institution for any associated report or interpretation.  MM Outside Films Mammo  Result Date: 11/27/2021 This examination belongs to an outside facility and is stored here for comparison purposes only.  Contact the originating outside institution for any associated report or interpretation.  MM Outside Films Mammo  Result Date: 11/27/2021 This examination belongs to an outside facility and is stored here for comparison purposes only.  Contact the originating outside institution for any associated report or interpretation.    Assessment/Plan 1. Venous ulcer of ankle, unspecified laterality ([place]) I have discussed the Jobst type Ulcer Care System with daily dressing changes at length.  I have sent in an Rx for Silvadene and also Keflex given the appearance consistent with cellulitis.  We will stop the Unna boots and go with the two layer system.     A total of 35 minutes was spent with this patient and greater than 50% was spent in counseling and coordination of care with the patient.  Discussion included the treatment options for vascular disease including indications for surgery and intervention.  Also discussed is the appropriate timing of treatment.  In addition medical therapy was discussed.   2. Chronic venous insufficiency I have discussed the Jobst type Ulcer Care System with daily dressing changes at length.  I have sent in an Rx for Silvadene and also Keflex given the appearance consistent with cellulitis.  We will stop the Unna boots and  go with the two layer system.    3. Essential hypertension Continue antihypertensive medications as already ordered, these medications have been reviewed and there are no changes at this time.   4. Hypothyroidism, unspecified type Continue hormone replacement as ordered and reviewed, no changes at this time    Hortencia Pilar, MD  12/15/2021  4:01 PM

## 2021-12-16 ENCOUNTER — Encounter (INDEPENDENT_AMBULATORY_CARE_PROVIDER_SITE_OTHER): Payer: Medicare Other

## 2021-12-16 ENCOUNTER — Telehealth: Payer: Self-pay | Admitting: Family Medicine

## 2021-12-16 ENCOUNTER — Ambulatory Visit: Payer: Medicare Other

## 2021-12-16 DIAGNOSIS — I872 Venous insufficiency (chronic) (peripheral): Secondary | ICD-10-CM

## 2021-12-16 DIAGNOSIS — S81809A Unspecified open wound, unspecified lower leg, initial encounter: Secondary | ICD-10-CM

## 2021-12-16 NOTE — Telephone Encounter (Signed)
Pt called requesting a call back . Stated she need a referral to the wound center for wounds that has come up on her right led  .  Please Advise # [phone]

## 2021-12-16 NOTE — Telephone Encounter (Signed)
Patient has called again just requesting a call back when possible to further discuss the issue

## 2021-12-16 NOTE — Telephone Encounter (Signed)
Beth Rodgers called in and wanted to know if she can get a referral to a specialist that specializes in lymphdema due to she is having issues with her legs and has developed wounds.

## 2021-12-16 NOTE — Telephone Encounter (Signed)
Pt seen yesterday.

## 2021-12-16 NOTE — Telephone Encounter (Signed)
Referral placed. Would offer OV as it may take a while to get her in with wound clinic.  What has [facility] said (seen yesterday)?

## 2021-12-17 ENCOUNTER — Telehealth (INDEPENDENT_AMBULATORY_CARE_PROVIDER_SITE_OTHER): Payer: Self-pay

## 2021-12-17 ENCOUNTER — Ambulatory Visit (INDEPENDENT_AMBULATORY_CARE_PROVIDER_SITE_OTHER): Payer: Medicare Other | Admitting: Nurse Practitioner

## 2021-12-17 ENCOUNTER — Other Ambulatory Visit: Payer: Self-pay

## 2021-12-17 VITALS — BP 130/80 | HR 62 | Temp 98.1°F | Ht 66.0 in | Wt 169.0 lb

## 2021-12-17 DIAGNOSIS — R600 Localized edema: Secondary | ICD-10-CM | POA: Insufficient documentation

## 2021-12-17 DIAGNOSIS — I872 Venous insufficiency (chronic) (peripheral): Secondary | ICD-10-CM | POA: Diagnosis not present

## 2021-12-17 NOTE — Assessment & Plan Note (Signed)
Patient is followed by vascular last seen by them 12/15/2021.  Where they prescribed Silvadene cream along with dressings of nonadherent Kerlix and Coban.  States that she did not want Unna boot at that time.  She was concerned to unwrap dressing on right lower extremity because she felt like blisters have burst and rubbed a spot.  Undressed wounds in office see pictures above with great.  Did redress bilateral legs with Silvadene cream, nonadherent pad, Kerlix, Coban.  2+ DP pulse after procedure did give warning signs as when to loosen bandages.  Patient knowledge understood recommend continue taking antibiotic and pain medication prescribed at vascular office and follow-up as scheduled on 12/26/2021.  Did discuss with patient that since this is not a complex wound wound home health would not likely come out.  She was more comfortable after coming in office doing the bandages herself.  Gave her a roll of Kerlix to adherent nonadherent bandages and 1 roll of Coban.

## 2021-12-17 NOTE — Telephone Encounter (Addendum)
Pt is currently in office seeing Romilda Garret for wound.   Matt came by to say he will document in today's progress note what was done for pt at [facility] visit and fwd to Dr. Darnell Level.

## 2021-12-17 NOTE — Assessment & Plan Note (Signed)
Secondary to venous insufficiency.

## 2021-12-17 NOTE — Telephone Encounter (Signed)
Pt called and left a VM on the nurses line saying that she was hurting an scared to change her bandage. Per the provider Dr. Delana Meyer I spent 30 minutes with her and actually she showed her YouTube videos of how she is supposed to dress her leg she refuses an Unna wrap she states that was at work caused her the blisters so I am uncertain as to how to address what kind of dressings she should have she supposed to be putting Silvadene gauze pad or nonstick pad a liner and then a compression sock on.  It supposed to be changed on a daily basis.  The prescriptions were for Silvadene 50 Gray in 2 and Keflex 500 mg 4 times a day for 10 days and they were already sent to the pharmacy so I cannot understand how she is asking about that either I made the pt aware  and she said that she was was going to change the bandage an that she has an appointment at her pcp  and he is going  to set her up with home care so they can change her bandage.

## 2021-12-17 NOTE — Telephone Encounter (Signed)
error 

## 2021-12-17 NOTE — Patient Instructions (Addendum)
Need to change the dressings daily Including using the Silvadene cream Keep the appointment with the Vascular office for follow up and continue using the medications they supplied.  Like I said if your feeling changes in the lower leg or feet. Pain increases, Numbness or tingling starts loosen the wraps on your legs

## 2021-12-17 NOTE — Progress Notes (Signed)
Acute Office Visit  Subjective:    Patient ID: Beth Rodgers, female    DOB: 10-Jul-1935, 86 y.o.   MRN: [id number]  Chief Complaint  Patient presents with   Wound Check    Wounds on both legs to check. Would also like to discuss [facility]. Discuss if okay to get flu shot today.     HPI Patient is in today for wound check States that she saw Rn on Friday 12/12/2021 for dressing on her bilateral lower legs. She was seen in office on 12/15/2021 by a provider and placed on keflex, tramadol, and silvadene cream. She was coming into office today because she felt like the dressing has irritated the leg and wounds and was scared to undress the wound as she may not get it dressed back.  It was dressed in non adherent, kerlix, and coban. I undress, evaluated and redressed the wound with new supplies from the office.  Past Medical History:  Diagnosis Date   Basal cell carcinoma (BCC)    txted with MOHs in past   Cellulitis 08/10/2018   Chronic venous insufficiency    with varicose veins   Family history of breast cancer    Family history of thyroid cancer    GERD (gastroesophageal reflux disease)    Glaucoma    History  of basal cell carcinoma    left nare/BREAST CANCER   History of chicken pox    Hypertension    Hypoglycemia    Hypothyroid    Osteoarthritis    back pain, L knee pain   Osteopenia 06/2009   DEXA 11/2015: T -2.3 hip, -2.2 spine, on longterm bisphosphonate/evista   Psoriasis    Pyogenic granuloma 04/16/2016    Past Surgical History:  Procedure Laterality Date   BASAL CELL CARCINOMA EXCISION     located on face   BREAST BIOPSY Right    core done ing [place]?   BREAST BIOPSY Left 08/16/2020   3 area bx 4:00 X, IMC, 6:00Q IMC, LN hydro #3-benign   BREAST LUMPECTOMY Left 09/11/2020   two areas of [facility]   CATARACT EXTRACTION     bilateral   DEXA  06/2009   T score Spine: -1.8, hip -2.0   EXCISION OF BREAST BIOPSY Left 09/11/2020    Procedure: EXCISION OF BREAST BIOPSY;  Surgeon: [name], MD;  Location: [facility] ORS;  Service: General;  Laterality: Left;   Foam sclerotherapy Bilateral 09/2015   [place], [place]   MASTECTOMY W/ SENTINEL NODE BIOPSY Left 06/25/2021   Procedure: MASTECTOMY WITH SENTINEL LYMPH NODE BIOPSY;  Surgeon: [name], MD;  Location: [facility] ORS;  Service: General;  Laterality: Left;   NASAL RECONSTRUCTION  2005   for [place] L nare s/p Mohs   nuclear stress test  2014   normal in [place] NODE BX Left 09/11/2020   Procedure: PARTIAL MASTECTOMY WITH NEEDLE LOCALIZATION AND AXILLARY SENTINEL LYMPH NODE [place];  Surgeon: [name], MD;  Location: [facility] ORS;  Service: General;  Laterality: Left;   RADIOFREQUENCY ABLATION Left 01/2012   remnant L G saphenous vein below knee   RADIOFREQUENCY ABLATION Right 02/2013   RLE vein ablation    VEIN LIGATION AND STRIPPING  remote   bilateral G saphenous veins    Family History  Problem Relation Age of Onset   Cancer Mother        thyroid cancer   Heart disease Father  CHF   Diabetes Father    Glaucoma Father    Arthritis Father    Coronary artery disease Maternal Uncle    Bipolar disorder Son    Heart disease Sister        CHF   Stroke Sister    Brain cancer Grandson        astrocytoma   Breast cancer Cousin        dx 53s    Social History   Socioeconomic History   Marital status: Married    Spouse name: Not on file   Number of children: 2   Years of education: Not on file   Highest education level: Not on file  Occupational History    Employer: RETIRED  Tobacco Use   Smoking status: Never   Smokeless tobacco: Never  Vaping Use   Vaping Use: Never used  Substance and Sexual Activity   Alcohol use: Yes    Comment: Occasionally drinks wine   Drug use: No   Sexual activity: Not Currently    Birth control/protection:  Post-menopausal  Other Topics Concern   Not on file  Social History Narrative   From [place], [place]   Caffeine: 2-3 cups/day   Lives with husband, majority of time alone as he travels to [place].   Has grandchildren nearby.   Occ: Field seismologist, Metallurgist   Activity: bed exercises, limiting walking   Diet: does get fruits and vegetables, only some water   Social Determinants of Radio broadcast assistant Strain: Low Risk    Difficulty of Paying Living Expenses: Not hard at all  Food Insecurity: No Food Insecurity   Worried About Charity fundraiser in the Last Year: Never true   Arboriculturist in the Last Year: Never true  Transportation Needs: No Transportation Needs   Lack of Transportation (Medical): No   Lack of Transportation (Non-Medical): No  Physical Activity: Sufficiently Active   Days of Exercise per Week: 7 days   Minutes of Exercise per Session: 30 min  Stress: No Stress Concern Present   Feeling of Stress : Not at all  Social Connections: Not on file  Intimate Partner Violence: Not At Risk   Fear of Current or Ex-Partner: No   Emotionally Abused: No   Physically Abused: No   Sexually Abused: No    Outpatient Medications Prior to Visit  Medication Sig Dispense Refill   alendronate (FOSAMAX) 70 MG tablet Take 70 mg by mouth every Sunday.     Calcium Carb-Cholecalciferol (CALCIUM 600 + D PO) Take 1 tablet by mouth daily. Ca 500 + Vit D 800     calcium carbonate (TUMS EX) 750 MG chewable tablet Chew 750 mg by mouth daily as needed for heartburn.     celecoxib (CELEBREX) 200 MG capsule Take 200 mg by mouth daily.     cephALEXin (KEFLEX) 500 MG capsule Take 1 capsule (500 mg total) by mouth 4 (four) times daily. 60 capsule 1   Ciclopirox 0.77 % gel Apply 1 application topically 2 (two) times daily as needed (fungus).     clobetasol cream (TEMOVATE) 0.86 % Apply 1 application topically as directed. Qd to bid up to 5 days a week to aa psoriasis on body  until clear, then prn flares, avoid face, groin, axilla (Patient taking differently: Apply 1 application topically 2 (two) times daily.) 60 g 2   diclofenac sodium (VOLTAREN) 1 % GEL Apply 1 application topically 3 (three) times daily. (Patient  taking differently: Apply 1 application topically 3 (three) times daily as needed (pain).) 1 Tube 0   latanoprost (XALATAN) 0.005 % ophthalmic solution latanoprost 0.005 % eye drops  PLACE 1 DROP INTO BOTH EYES ONCE EVERY EVENING     levothyroxine (SYNTHROID) 75 MCG tablet Take 1 tablet (75 mcg total) by mouth daily. 90 tablet 3   losartan (COZAAR) 100 MG tablet TAKE 1 TABLET BY MOUTH EVERY DAY 90 tablet 3   Magnesium 400 MG TABS Take 400 mg by mouth daily.     Multiple Vitamin (MULTIVITAMIN) tablet Take 1 tablet by mouth daily.     SELENIUM PO Take by mouth daily.     silver sulfADIAZINE (SILVADENE) 1 % cream Apply 1 application topically daily. 50 g 4   tacrolimus (PROTOPIC) 0.1 % ointment APPLY TOPICALLY TO AFFECTED AREA(S) TWICE DAILY 30 g 0   vitamin C (ASCORBIC ACID) 500 MG tablet Take 500 mg by mouth daily.     vitamin E 180 MG (400 UNITS) capsule Take 400 Units by mouth daily.     traMADol (ULTRAM) 50 MG tablet Take 1 tablet (50 mg total) by mouth every 6 (six) hours as needed. (Patient not taking: Reported on 12/17/2021) 20 tablet 0   No facility-administered medications prior to visit.    Allergies  Allergen Reactions   Anastrozole Other (See Comments)    Back pain    Exemestane     Pain in right hand    Naproxen Hives    Had bumps break out on the back     Review of Systems  Constitutional:  Negative for chills and fever.  Respiratory:  Negative for cough and shortness of breath.   Cardiovascular:  Positive for leg swelling. Negative for chest pain.  Gastrointestinal:  Negative for nausea and vomiting.  Musculoskeletal:        Uses a cane  Skin:  Positive for color change and wound.  Neurological:  Negative for weakness.       Objective:    Physical Exam Vitals and nursing note reviewed.  Constitutional:      General: She is not in acute distress.    Appearance: Normal appearance. She is not ill-appearing.     Comments: Walks with a cane  Cardiovascular:     Rate and Rhythm: Normal rate and regular rhythm.     Pulses:          Dorsalis pedis pulses are 2+ on the right side and 2+ on the left side.  Pulmonary:     Effort: Pulmonary effort is normal.     Breath sounds: Normal breath sounds.  Musculoskeletal:     Right lower leg: 1+ Pitting Edema present.     Left lower leg: 1+ Pitting Edema present.  Skin:    General: Skin is warm.     Findings: No bruising or erythema.  Neurological:     Mental Status: She is alert.  Psychiatric:        Mood and Affect: Mood normal.        Behavior: Behavior normal.        Thought Content: Thought content normal.        Judgment: Judgment normal.              BP 130/80 (BP Location: Left Arm, Patient Position: Sitting, Cuff Size: Normal)    Pulse 62    Temp 98.1 F (36.7 C) (Temporal)    Ht 5\' 6"  (1.676 m)    Wt  169 lb (76.7 kg)    SpO2 95%    BMI 27.28 kg/m  Wt Readings from Last 3 Encounters:  12/17/21 169 lb (76.7 kg)  12/15/21 170 lb 3.2 oz (77.2 kg)  12/12/21 172 lb (78 kg)    Health Maintenance Due  Topic Date Due   Zoster Vaccines- Shingrix (1 of 2) Never done   INFLUENZA VACCINE  07/28/2021    There are no preventive care reminders to display for this patient.   Lab Results  Component Value Date   TSH 2.44 06/02/2021   Lab Results  Component Value Date   WBC 6.3 06/02/2021   HGB 13.9 06/02/2021   HCT 40.9 06/02/2021   MCV 85.7 06/02/2021   PLT 230.0 06/02/2021   Lab Results  Component Value Date   NA 139 06/02/2021   K 4.3 06/02/2021   CO2 27 06/02/2021   GLUCOSE 82 06/02/2021   BUN 19 06/02/2021   CREATININE 0.72 06/02/2021   BILITOT 0.4 08/21/2020   ALKPHOS 82 08/21/2020   AST 19 08/21/2020   ALT 16 08/21/2020    PROT 7.4 08/21/2020   ALBUMIN 4.3 08/21/2020   CALCIUM 9.8 06/02/2021   ANIONGAP 9 01/04/2021   GFR 76.97 06/02/2021   Lab Results  Component Value Date   CHOL 193 02/16/2020   Lab Results  Component Value Date   HDL 77.30 02/16/2020   Lab Results  Component Value Date   LDLCALC 104 (H) 02/16/2020   Lab Results  Component Value Date   TRIG 59.0 02/16/2020   Lab Results  Component Value Date   CHOLHDL 2 02/16/2020   No results found for: HGBA1C     Assessment & Plan:   Problem List Items Addressed This Visit       Cardiovascular and Mediastinum   Chronic venous insufficiency - Primary    Patient is followed by vascular last seen by them 12/15/2021.  Where they prescribed Silvadene cream along with dressings of nonadherent Kerlix and Coban.  States that she did not want Unna boot at that time.  She was concerned to unwrap dressing on right lower extremity because she felt like blisters have burst and rubbed a spot.  Undressed wounds in office see pictures above with great.  Did redress bilateral legs with Silvadene cream, nonadherent pad, Kerlix, Coban.  2+ DP pulse after procedure did give warning signs as when to loosen bandages.  Patient knowledge understood recommend continue taking antibiotic and pain medication prescribed at vascular office and follow-up as scheduled on 12/26/2021.  Did discuss with patient that since this is not a complex wound wound home health would not likely come out.  She was more comfortable after coming in office doing the bandages herself.  Gave her a roll of Kerlix to adherent nonadherent bandages and 1 roll of Coban.        Other   Bilateral lower extremity edema    Secondary to venous insufficiency.        No orders of the defined types were placed in this encounter.  This visit occurred during the SARS-CoV-2 public health emergency.  Safety protocols were in place, including screening questions prior to the visit, additional usage of  staff PPE, and extensive cleaning of exam room while observing appropriate contact time as indicated for disinfecting solutions.   Romilda Garret, NP

## 2021-12-23 ENCOUNTER — Ambulatory Visit: Payer: Medicare Other

## 2021-12-23 ENCOUNTER — Ambulatory Visit (INDEPENDENT_AMBULATORY_CARE_PROVIDER_SITE_OTHER): Payer: Medicare Other | Admitting: Psychology

## 2021-12-23 ENCOUNTER — Encounter (INDEPENDENT_AMBULATORY_CARE_PROVIDER_SITE_OTHER): Payer: Medicare Other

## 2021-12-23 DIAGNOSIS — F4323 Adjustment disorder with mixed anxiety and depressed mood: Secondary | ICD-10-CM

## 2021-12-23 NOTE — Progress Notes (Signed)
[facility] Counselor/Therapist Progress Note ° °Patient ID: Tahra M Innis, MRN: [id number]   ° °Date: 12/23/21 ° °Time Spent: 1:02 pm - 2:04 pm : 62 Minutes ° °Treatment Type: Individual Therapy. ° °Reported Symptoms: Rumination.   ° °Mental Status Exam: °Appearance:  NA     °Behavior: Appropriate  °Motor: NA  °Speech/Language:  Clear and Coherent and Normal Rate  °Affect: Congruent  °Mood: dysthymic  °Thought process: normal  °Thought content:   WNL  °Sensory/Perceptual disturbances:   WNL  °Orientation: oriented to person, place, time/date, and situation  °Attention: Good  °Concentration: Good  °Memory: WNL  °Fund of knowledge:  Good  °Insight:   Good  °Judgment:  Good  °Impulse Control: Good  ° °Risk Assessment: °Danger to Self:  No °Self-injurious Behavior: No °Danger to Others: No °Duty to Warn:no °Physical Aggression / Violence:No  °Access to Firearms a concern: No  °Gang Involvement:No  ° °Subjective:  ° °Shaton M Hummel participated from home, via phone, and consented to treatment. Therapist participated from office. We met online due to COVID pandemic. Yailine reviewed the events of the past two weeks. She noted this past two weeks being an emotional time due to missing her grandchildren during the holiday season. We continued to process her grieving during the session. Kaydee discussed her frustration regarding her medical care and noted a possible need to switch providers. We explored her frustration during the session and her attempts to address/resolve these concerns. We employed principles of BA to identify boundaries regarding engagement in a hobby, painting, while dealing with physical limitations. Neyla was engaged and motivated and expressed commitment towards addressing barriers related engagement in enjoyable activities. Therapist validated Kates experienced feelings and provided supportive therapy. °  °Interventions: Interpersonal and Behavioral Activation. ° °Diagnosis: Adjustment  disorder with mixed anxiety and depressed mood ° ° °Plan: Patient is to use CBT, BA, Problem-solving, mindfulness and coping skills to help manage decrease symptoms associated with their diagnosis. (Target date: 08/16/22) °  °Long-term goal:   °Reduce overall level, frequency, and intensity of the feelings of depression and anxiety evidenced by   decreased sadness, rumination, lethargy, and interpersonal stressors  from 6 to 7 days/week to 0 to 1 days/week per client report for at least 3 consecutive months. ° °Short-term goal:  °Verbally express understanding of the relationship between feelings of depression, anxiety and their impact on thinking patterns and behaviors. °Verbalize an understanding of the role that distorted thinking plays in creating fears, excessive worry, and ruminations. °Engage in enjoyable activities on a consistent basis. ° ° , LCSW ° °  ° °

## 2021-12-24 ENCOUNTER — Telehealth: Payer: Self-pay | Admitting: Family Medicine

## 2021-12-24 ENCOUNTER — Encounter (INDEPENDENT_AMBULATORY_CARE_PROVIDER_SITE_OTHER): Payer: Self-pay | Admitting: Vascular Surgery

## 2021-12-24 DIAGNOSIS — I83003 Varicose veins of unspecified lower extremity with ulcer of ankle: Secondary | ICD-10-CM | POA: Insufficient documentation

## 2021-12-24 DIAGNOSIS — I83813 Varicose veins of bilateral lower extremities with pain: Secondary | ICD-10-CM

## 2021-12-24 DIAGNOSIS — R600 Localized edema: Secondary | ICD-10-CM

## 2021-12-24 DIAGNOSIS — I890 Lymphedema, not elsewhere classified: Secondary | ICD-10-CM

## 2021-12-24 DIAGNOSIS — I872 Venous insufficiency (chronic) (peripheral): Secondary | ICD-10-CM

## 2021-12-24 MED ORDER — ALENDRONATE SODIUM 70 MG PO TABS: 70.0000 mg | ORAL_TABLET | ORAL | 3 refills | Status: DC

## 2021-12-24 NOTE — Telephone Encounter (Signed)
Fosamax refilled.  Who was prescribing previously?

## 2021-12-24 NOTE — Telephone Encounter (Signed)
After some research her name is Andrey Spearman, MS, OTR/L,  CLT, occupational therapist with advanced certification in lymphedema treatment she is at [facility].

## 2021-12-24 NOTE — Telephone Encounter (Signed)
What specialty is she in?

## 2021-12-24 NOTE — Telephone Encounter (Signed)
Spoke to patient by telephone and was advised that she was given the Fosamax by Dr. Marolyn Hammock an oncologist at [facility]. Patient stated that he did a bone density test and it showed that she has osteopenia.Patient stated that she has been on Fosamax for a year. Patient stated that she would like a referral to Renard Matter NP at [facility] that specializes in lymphedema.  Patient stated that she has had problems with her veins for years and has pain in her legs. Patient stated that she has done some research and feels that this is the person that she needs to see.

## 2021-12-24 NOTE — Addendum Note (Signed)
Addended by: Ria Bush on: 12/24/2021 12:30 PM   Modules accepted: Orders

## 2021-12-24 NOTE — Telephone Encounter (Signed)
Pt called she wanted to know if Dr. Darnell Level would start refill her alendronate (FOSAMAX) 70 MG tablet, The Doctor that was filling it has retired   Pt would like a call back   Pt was also transferred to Access Nurse for on going leg swelling

## 2021-12-24 NOTE — Telephone Encounter (Signed)
PLEASE NOTE: All timestamps contained within this report are represented as [place] Standard Time. CONFIDENTIALTY NOTICE: This fax transmission is intended only for the addressee. It contains information that is legally privileged, confidential or otherwise protected from use or disclosure. If you are not the intended recipient, you are strictly prohibited from reviewing, disclosing, copying using or disseminating any of this information or taking any action in reliance on or regarding this information. If you have received this fax in error, please notify us immediately by telephone so that we can arrange for its return to [place]. Phone: [phone], Toll-Free: [phone], Fax: [phone] Page: 1 of 1 Call Id: [id number] [place] [facility] - Client Nonclinical Telephone Rodgers  Access[facility]urse Client [place] [facility] - Client Client Site [place] Provider Ria Bush - MD Contact Type Call Who Is Calling Patient / Member / Family / Caregiver Caller Name Beth Rodgers Caller Phone Number [phone] Patient Name Beth Rodgers Patient DOB March 22, 1935 Call Type Message Only Information Provided Reason for Call Medication Question / Request Initial Comment Caller states she needs to get a referral from her doctor nurse. Caller states she wants to know if her doctor is also able to fill her prescription that her previous retired doctor prescribed. Disp. Time Disposition Final User 12/24/2021 11:35:16 AM General Information Provided Yes Cathlean Sauer Call Closed By: Cathlean Sauer Transaction Date/Time: 12/24/2021 11:30:18 AM (ET)

## 2021-12-25 NOTE — Addendum Note (Signed)
Addended by: Ria Bush on: 12/25/2021 09:31 AM   Modules accepted: Orders

## 2021-12-25 NOTE — Telephone Encounter (Signed)
Fosamax refilled.  OT referral placed.

## 2021-12-26 ENCOUNTER — Telehealth (INDEPENDENT_AMBULATORY_CARE_PROVIDER_SITE_OTHER): Payer: Self-pay

## 2021-12-26 ENCOUNTER — Encounter (INDEPENDENT_AMBULATORY_CARE_PROVIDER_SITE_OTHER): Payer: Self-pay | Admitting: Nurse Practitioner

## 2021-12-26 ENCOUNTER — Other Ambulatory Visit: Payer: Self-pay

## 2021-12-26 ENCOUNTER — Ambulatory Visit (INDEPENDENT_AMBULATORY_CARE_PROVIDER_SITE_OTHER): Payer: Medicare Other | Admitting: Nurse Practitioner

## 2021-12-26 VITALS — BP 145/82 | HR 73 | Resp 16 | Wt 168.4 lb

## 2021-12-26 DIAGNOSIS — I83003 Varicose veins of unspecified lower extremity with ulcer of ankle: Secondary | ICD-10-CM

## 2021-12-26 DIAGNOSIS — I890 Lymphedema, not elsewhere classified: Secondary | ICD-10-CM

## 2021-12-26 DIAGNOSIS — L97309 Non-pressure chronic ulcer of unspecified ankle with unspecified severity: Secondary | ICD-10-CM | POA: Diagnosis not present

## 2021-12-26 DIAGNOSIS — Z23 Encounter for immunization: Secondary | ICD-10-CM | POA: Diagnosis not present

## 2021-12-26 NOTE — Telephone Encounter (Signed)
The pt called an left a VM on the nurse line saying at her visit today with the NP here at our office a lymphedema referral  was mentioned. She wanted to make the NP aware that she has thought about it an would like to proceed with the referral.

## 2021-12-26 NOTE — Telephone Encounter (Signed)
I called an Made the pt aware of the NP's instructions

## 2021-12-26 NOTE — Telephone Encounter (Signed)
I will place referral.

## 2021-12-27 ENCOUNTER — Encounter (INDEPENDENT_AMBULATORY_CARE_PROVIDER_SITE_OTHER): Payer: Self-pay | Admitting: Nurse Practitioner

## 2021-12-27 NOTE — Progress Notes (Signed)
Subjective:    Patient ID: Beth Rodgers, female    DOB: [birth year], 86 y.o.   MRN: [id number] Chief Complaint  Patient presents with   Follow-up    1 week follow up    The patient returns to the office for followup evaluation regarding leg swelling.  The swelling has improved quite a bit and the pain associated with swelling has decreased substantially.  The previous wounds and ulcerations have healed.  Since the previous visit the patient has been wearing graduated compression stockings and has noted little significant improvement in the lymphedema. The patient has been using compression routinely morning until night.  The patient also states elevation during the day and exercise is being done too.        Review of Systems  Cardiovascular:  Positive for leg swelling.  Skin:  Negative for wound.  All other systems reviewed and are negative.     Objective:   Physical Exam Vitals reviewed.  HENT:     Head: Normocephalic.  Cardiovascular:     Rate and Rhythm: Normal rate.     Pulses: Normal pulses.  Pulmonary:     Effort: Pulmonary effort is normal.  Musculoskeletal:     Right lower leg: Edema present.     Left lower leg: Edema present.  Skin:    General: Skin is warm and dry.  Neurological:     Mental Status: She is alert and oriented to person, place, and time.  Psychiatric:        Mood and Affect: Mood normal.        Behavior: Behavior normal.        Thought Content: Thought content normal.        Judgment: Judgment normal.    BP (!) 145/82 (BP Location: Right Arm)    Pulse 73    Resp 16    Wt 168 lb 6.4 oz (76.4 kg)    BMI 27.18 kg/m   Past Medical History:  Diagnosis Date   Basal cell carcinoma (BCC)    txted with MOHs in past   Cellulitis 08/10/2018   Chronic venous insufficiency    with varicose veins   Family history of breast cancer    Family history of thyroid cancer    GERD (gastroesophageal reflux disease)    Glaucoma    History  of basal  cell carcinoma    left nare/BREAST CANCER   History of chicken pox    Hypertension    Hypoglycemia    Hypothyroid    Osteoarthritis    back pain, L knee pain   Osteopenia 06/2009   DEXA 11/2015: T -2.3 hip, -2.2 spine, on longterm bisphosphonate/evista   Psoriasis    Pyogenic granuloma 04/16/2016    Social History   Socioeconomic History   Marital status: Married    Spouse name: Not on file   Number of children: 2   Years of education: Not on file   Highest education level: Not on file  Occupational History    Employer: RETIRED  Tobacco Use   Smoking status: Never   Smokeless tobacco: Never  Vaping Use   Vaping Use: Never used  Substance and Sexual Activity   Alcohol use: Yes    Comment: Occasionally drinks wine   Drug use: No   Sexual activity: Not Currently    Birth control/protection: Post-menopausal  Other Topics Concern   Not on file  Social History Narrative   From [employer], [place]   Caffeine: 2-3 cups/day  Lives with husband, majority of time alone as he travels to [place].   Has grandchildren nearby.   Occ: Field seismologist, Metallurgist   Activity: bed exercises, limiting walking   Diet: does get fruits and vegetables, only some water   Social Determinants of Radio broadcast assistant Strain: Low Risk    Difficulty of Paying Living Expenses: Not hard at all  Food Insecurity: No Food Insecurity   Worried About Charity fundraiser in the Last Year: Never true   Arboriculturist in the Last Year: Never true  Transportation Needs: No Transportation Needs   Lack of Transportation (Medical): No   Lack of Transportation (Non-Medical): No  Physical Activity: Sufficiently Active   Days of Exercise per Week: 7 days   Minutes of Exercise per Session: 30 min  Stress: No Stress Concern Present   Feeling of Stress : Not at all  Social Connections: Not on file  Intimate Partner Violence: Not At Risk   Fear of Current or Ex-Partner: No   Emotionally  Abused: No   Physically Abused: No   Sexually Abused: No    Past Surgical History:  Procedure Laterality Date   BASAL CELL CARCINOMA EXCISION     located on face   BREAST BIOPSY Right    core done ing [place]?   BREAST BIOPSY Left 08/16/2020   3 area bx 4:00 X, IMC, 6:00Q IMC, LN hydro #3-benign   BREAST LUMPECTOMY Left 09/11/2020   two areas of [facility]   CATARACT EXTRACTION     bilateral   DEXA  06/2009   T score Spine: -1.8, hip -2.0   EXCISION OF BREAST BIOPSY Left 09/11/2020   Procedure: EXCISION OF BREAST BIOPSY;  Surgeon: [name], MD;  Location: [facility] ORS;  Service: General;  Laterality: Left;   Foam sclerotherapy Bilateral 09/2015   [place], [place]   MASTECTOMY W/ SENTINEL NODE BIOPSY Left 06/25/2021   Procedure: MASTECTOMY WITH SENTINEL LYMPH NODE BIOPSY;  Surgeon: [name], MD;  Location: [facility] OR[facility];  Service: General;  Laterality: Left;   NASAL RECONSTRUCTION  2005   for [place] L nare s/p Mohs   nuclear stress test  2014   normal in [place] NODE BX Left 09/11/2020   Procedure: PARTIAL MASTECTOMY WITH NEEDLE LOCALIZATION AND AXILLARY SENTINEL LYMPH NODE BX;  Surgeon: [name], MD;  Location: [facility] ORS;  Service: General;  Laterality: Left;   RADIOFREQUENCY ABLATION Left 01/2012   remnant L G saphenous vein below knee   RADIOFREQUENCY ABLATION Right 02/2013   RLE vein ablation    VEIN LIGATION AND STRIPPING  remote   bilateral G saphenous veins    Family History  Problem Relation Age of Onset   Cancer Mother        thyroid cancer   Heart disease Father        CHF   Diabetes Father    Glaucoma Father    Arthritis Father    Coronary artery disease Maternal Uncle    Bipolar disorder Son    Heart disease Sister        CHF   Stroke Sister    Brain cancer Grandson        astrocytoma   Breast cancer Cousin        dx 82s    Allergies  Allergen  Reactions   Anastrozole Other (See Comments)    Back pain  Exemestane     Pain in right hand    Naproxen Hives    Had bumps break out on the back     CBC Latest Ref Rng & Units 06/02/2021 01/04/2021 08/21/2020  WBC 4.0 - 10.5 K/uL 6.3 7.7 7.4  Hemoglobin 12.0 - 15.0 g/dL 13.9 13.4 13.7  Hematocrit 36.0 - 46.0 % 40.9 41.2 40.6  Platelets 150.0 - 400.0 K/uL 230.0 226 221      CMP     Component Value Date/Time   NA 139 06/02/2021 1227   K 4.3 06/02/2021 1227   CL 102 06/02/2021 1227   CO2 27 06/02/2021 1227   GLUCOSE 82 06/02/2021 1227   BUN 19 06/02/2021 1227   CREATININE 0.72 06/02/2021 1227   CALCIUM 9.8 06/02/2021 1227   PROT 7.4 08/21/2020 1625   ALBUMIN 4.3 08/21/2020 1625   AST 19 08/21/2020 1625   ALT 16 08/21/2020 1625   ALKPHOS 82 08/21/2020 1625   BILITOT 0.4 08/21/2020 1625   GFRNONAA >60 01/04/2021 0814   GFRAA >60 08/21/2020 1625     No results found.     Assessment & Plan:   1. Lymphedema No surgery or intervention at this point in time.    I have reviewed my discussion with the patient regarding venous insufficiency and secondary lymph edema and why it  causes symptoms. I have discussed with the patient the chronic skin changes that accompany these problems and the long term sequela such as ulceration and infection.  Patient will continue wearing graduated compression stockings class 1 (20-30 mmHg) on a daily basis a prescription was given to the patient to keep this updated. The patient will  put the stockings on first thing in the morning and removing them in the evening. The patient is instructed specifically not to sleep in the stockings.  In addition, behavioral modification including elevation during the day will be continued.  Diet and salt restriction was also discussed.   - Ambulatory referral to Occupational Therapy  2. Venous ulcer of ankle, unspecified laterality ([place]) Currently healed, advised to continue with conservative  therapy   Current Outpatient Medications on File Prior to Visit  Medication Sig Dispense Refill   [START ON 12/28/2021] alendronate (FOSAMAX) 70 MG tablet Take 1 tablet (70 mg total) by mouth every Sunday. 12 tablet 3   Calcium Carb-Cholecalciferol (CALCIUM 600 + D PO) Take 1 tablet by mouth daily. Ca 500 + Vit D 800     calcium carbonate (TUMS EX) 750 MG chewable tablet Chew 750 mg by mouth daily as needed for heartburn.     celecoxib (CELEBREX) 200 MG capsule Take 200 mg by mouth daily.     cephALEXin (KEFLEX) 500 MG capsule Take 1 capsule (500 mg total) by mouth 4 (four) times daily. 60 capsule 1   Ciclopirox 0.77 % gel Apply 1 application topically 2 (two) times daily as needed (fungus).     clobetasol cream (TEMOVATE) 8.58 % Apply 1 application topically as directed. Qd to bid up to 5 days a week to aa psoriasis on body until clear, then prn flares, avoid face, groin, axilla (Patient taking differently: Apply 1 application topically 2 (two) times daily.) 60 g 2   diclofenac sodium (VOLTAREN) 1 % GEL Apply 1 application topically 3 (three) times daily. (Patient taking differently: Apply 1 application topically 3 (three) times daily as needed (pain).) 1 Tube 0   latanoprost (XALATAN) 0.005 % ophthalmic solution latanoprost 0.005 % eye drops  PLACE 1 DROP  INTO BOTH EYES ONCE EVERY EVENING     levothyroxine (SYNTHROID) 75 MCG tablet Take 1 tablet (75 mcg total) by mouth daily. 90 tablet 3   losartan (COZAAR) 100 MG tablet TAKE 1 TABLET BY MOUTH EVERY DAY 90 tablet 3   Magnesium 400 MG TABS Take 400 mg by mouth daily.     Multiple Vitamin (MULTIVITAMIN) tablet Take 1 tablet by mouth daily.     SELENIUM PO Take by mouth daily.     silver sulfADIAZINE (SILVADENE) 1 % cream Apply 1 application topically daily. 50 g 4   tacrolimus (PROTOPIC) 0.1 % ointment APPLY TOPICALLY TO AFFECTED AREA(S) TWICE DAILY 30 g 0   vitamin C (ASCORBIC ACID) 500 MG tablet Take 500 mg by mouth daily.     vitamin E 180 MG  (400 UNITS) capsule Take 400 Units by mouth daily.     No current facility-administered medications on file prior to visit.    There are no Patient Instructions on file for this visit. No follow-ups on file.   Kris Hartmann, NP

## 2021-12-28 DIAGNOSIS — R059 Cough, unspecified: Secondary | ICD-10-CM | POA: Diagnosis not present

## 2021-12-28 DIAGNOSIS — U071 COVID-19: Secondary | ICD-10-CM | POA: Diagnosis not present

## 2021-12-30 ENCOUNTER — Telehealth: Payer: Self-pay | Admitting: Family Medicine

## 2021-12-30 ENCOUNTER — Encounter (INDEPENDENT_AMBULATORY_CARE_PROVIDER_SITE_OTHER): Payer: Medicare Other

## 2021-12-30 ENCOUNTER — Ambulatory Visit: Payer: Medicare Other | Admitting: Physician Assistant

## 2021-12-30 NOTE — Telephone Encounter (Signed)
Seen by [facility], prescribed full dose paxlovid.  Reviewed med list for drug interactions.  Paxlovid drug interactions:   Tacrolimus ointment - may increase effect however as topical med I don't think should cause any trouble.   I think reasonable to go ahead and take paxlovid, watch for common side effects of metallic taste in mouth while on it, or nausea/diarrhea.  Keep [place] updated with how she's doing.

## 2021-12-30 NOTE — Telephone Encounter (Signed)
Spoke to pt and relayed Dr. Synthia Innocent recommendation and note. Pt stated understanding and will keep [place] updated.

## 2021-12-30 NOTE — Telephone Encounter (Signed)
°  Please see note from D G under the access nurse note that D G has already responded to and sent note to [facility].       [place] Night - Client Nonclinical Telephone Record  AccessNurse Client [place] Night - Client Client Site [place] Provider Ria Bush - MD Contact Type Call Who Is Calling Patient / Member / Family / Caregiver Caller Name Beth Rodgers Caller Phone Number [phone] Patient Name Beth Rodgers Patient DOB 1935/05/17 Call Type Message Only Information Provided Reason for Call Request for General Office Information Initial Comment Caller states she has prescription questions. Caller tested positive for Covid and was given Paxlovid. Patient asking if doctor thinks it's okay for he rot take medication. Additional Comment Provided office hours. Disp. Time Disposition Final User 12/29/2021 7:48:10 AM General Information Provided Yes Mcclendon, Kiara Call Closed By: Simonne Martinet Transaction Date/Time: 12/29/2021 7:43:40 AM (ET

## 2021-12-30 NOTE — Telephone Encounter (Signed)
Patient is covid pos  Patient is asking Dr. Darnell Level to call her and let her know if it is okay to take the Paxlovid prescribed by the MD on 1.1.23.    Patient has medication at home, will start taking but wants confirmation from Dr. Darnell Level.  Phone: [phone]

## 2022-01-02 ENCOUNTER — Telehealth: Payer: Self-pay

## 2022-01-02 NOTE — Telephone Encounter (Signed)
Left message on voicemail to return my call. I was returning the patient's call. She states she tested positive for Covid on 12/28/21 and wants to know if she should cancel her appointment for 01/07/22. She should be fine to continue with regularly scheduled appointment.

## 2022-01-05 ENCOUNTER — Ambulatory Visit: Payer: Medicare Other | Attending: Family Medicine

## 2022-01-05 ENCOUNTER — Ambulatory Visit (INDEPENDENT_AMBULATORY_CARE_PROVIDER_SITE_OTHER): Payer: Medicare Other | Admitting: Vascular Surgery

## 2022-01-05 DIAGNOSIS — G8929 Other chronic pain: Secondary | ICD-10-CM | POA: Diagnosis not present

## 2022-01-05 DIAGNOSIS — I890 Lymphedema, not elsewhere classified: Secondary | ICD-10-CM | POA: Diagnosis not present

## 2022-01-05 DIAGNOSIS — M25562 Pain in left knee: Secondary | ICD-10-CM | POA: Diagnosis not present

## 2022-01-05 DIAGNOSIS — R262 Difficulty in walking, not elsewhere classified: Secondary | ICD-10-CM | POA: Diagnosis not present

## 2022-01-05 DIAGNOSIS — M79672 Pain in left foot: Secondary | ICD-10-CM | POA: Insufficient documentation

## 2022-01-05 DIAGNOSIS — M542 Cervicalgia: Secondary | ICD-10-CM | POA: Insufficient documentation

## 2022-01-05 NOTE — Therapy (Addendum)
[facility] [address], [place], [address] Phone: [phone]   Fax:  [phone]  Physical Therapy Treatment  Patient Details  Name: Beth Rodgers MRN: [id number] Date of Birth: [birth year] Referring Provider (PT): Owens Loffler, MD   Encounter Date: 01/05/2022   PT End of Session - 01/05/22 1335     Visit Number 24    Number of Visits 65    Date for PT Re-Evaluation 01/29/22    Authorization Type Medicare    Progress Note Due on Visit 30    PT Start Time 1335    PT Stop Time 5852    PT Time Calculation (min) 48 min    Activity Tolerance Patient tolerated treatment well    Behavior During Therapy [facility] for tasks assessed/performed             Past Medical History:  Diagnosis Date   Basal cell carcinoma ([place])    txted with MOHs in past   Cellulitis 08/10/2018   Chronic venous insufficiency    with varicose veins   Family history of breast cancer    Family history of thyroid cancer    GERD (gastroesophageal reflux disease)    Glaucoma    History  of basal cell carcinoma    left nare/BREAST CANCER   History of chicken pox    Hypertension    Hypoglycemia    Hypothyroid    Osteoarthritis    back pain, L knee pain   Osteopenia 06/2009   DEXA 11/2015: T -2.3 hip, -2.2 spine, on longterm bisphosphonate/evista   Psoriasis    Pyogenic granuloma 04/16/2016    Past Surgical History:  Procedure Laterality Date   BASAL CELL CARCINOMA EXCISION     located on face   BREAST BIOPSY Right    core done ing [place]?   BREAST BIOPSY Left 08/16/2020   3 area bx 4:00 X, IMC, 6:00Q IMC, LN hydro #3-benign   BREAST LUMPECTOMY Left 09/11/2020   two areas of [facility]   CATARACT EXTRACTION     bilateral   DEXA  06/2009   T score Spine: -1.8, hip -2.0   EXCISION OF BREAST BIOPSY Left 09/11/2020   Procedure: EXCISION OF BREAST BIOPSY;  Surgeon: [name], MD;  Location: [facility] ORS;  Service:  General;  Laterality: Left;   Foam sclerotherapy Bilateral 09/2015   [place], [place]   MASTECTOMY W/ SENTINEL NODE BIOPSY Left 06/25/2021   Procedure: MASTECTOMY WITH SENTINEL LYMPH NODE BIOPSY;  Surgeon: [name], MD;  Location: [facility] ORS;  Service: General;  Laterality: Left;   NASAL RECONSTRUCTION  2005   for [place] L nare s/p Mohs   nuclear stress test  2014   normal in [place] [facility] Left 09/11/2020   Procedure: PARTIAL MASTECTOMY WITH NEEDLE LOCALIZATION AND AXILLARY SENTINEL LYMPH NODE [place];  Surgeon: [name], MD;  Location: [facility] ORS;  Service: General;  Laterality: Left;   RADIOFREQUENCY ABLATION Left 01/2012   remnant L G saphenous vein below knee   RADIOFREQUENCY ABLATION Right 02/2013   RLE vein ablation    VEIN LIGATION AND STRIPPING  remote   bilateral G saphenous veins    There were no vitals filed for this visit.   Subjective Assessment - 01/05/22 1337     Subjective No fevers. L heel is better. Had LE lymphedema isssues, better now. No L heel pain currently. L knee  hurts her a little bit once in a while. Has been doing some of her exercises. Did not notice L knee pain when walking currently.    Pertinent History Neck and achilles pain. L knee also bothers her. Had injection L knee which helped 10 days before her breast surgery June 25, 2021. Dr. [name] also states Pt needs surgery for her L knee (TKA). Pt however has a heel spur on L foot which is chronic. Went to 3 specialists for heel spur and was told that there is nothing can be done surgically. The heel pain however affects her gait which affects her L knee increasing L knee pain. Pt continues to do her HEP from previous PT. Neck pain began about 6 months ago gradually. Started feeling tightness from R medial shoulder blade to neck. Pt is R hand dominant. L foot bothers her more currently.    Currently in Pain?  No/denies                                        PT Education - 01/05/22 1436     Education Details ther-ex    Person(s) Educated Patient    Methods Explanation;Demonstration;Tactile cues;Verbal cues    Comprehension Returned demonstration;Verbalized understanding            Objective     No latex band allergies   B varicose veins legs Osteopenia     L heel pain Hx of L mastectomy on June 25, 2021 and had home health PT for recovery.      Started using a SPC 5 months ago when ambulating by herself. Furniture walk at home.    [employer] Access Code [email or web address]     Manual therapy   Seated STM R cervical paraspinal and B upper trap muscles to decrease tension   Decreased pain with cervical rotation overall       Therapeutic exercise  Reviewed progress with heel and knee pain with pt   R and L cervical rotation with cues to decrease lower cervical extension compensation  Seated chin tuck 10x5 seconds for 3 sets  Seated B scapular retraction 10x5 seconds for 3 sets  Reviewed today's HEP. Pt demonstrated and verbalized understanding. Handout provided.   Seated manually resisted scapular retraction targeting the lower trap muscles   R 10x5 seconds   L 10x5 seconds        Improved exercise technique, movement at target joints, use of target muscles after min to mod verbal, visual, tactile cues.        Response to treatment Pt tolerated session well without aggravation of symptoms.  Decreased neck pain with cervical rotation after session.        Clinical Impression Pt demonstrates significant decrease in L heel and knee pain since initial evaluation. Focused mainly on treating neck pain today secondary to reports of increased discomfort. Worked on decreasing muscle tension to cervical paraspinal and upper trap muscles as well as decreasing lower cervical extension compensation to improve cervical mechanics. Decreased neck pain  reported after session.  Pt will benefit from continued skilled physical therapy services to decrease pain, improve strength and function.          PT Short Term Goals - 10/09/21 1422       PT SHORT TERM GOAL #1   Title Patient will be independent with her initial HEP to improve strength, decrease neck and L  heel pain, improve function.    Baseline Does her HEP, no questions (10/09/2021)    Time 3    Period Weeks    Status Achieved    Target Date 09/25/21               PT Long Term Goals - 01/05/22 1341       PT LONG TERM GOAL #1   Title Pt will have a decrease in L heel pain to 4/10 or less at worst to promote ability to ambulate, perform standing tasks more comfortably.    Baseline 8/10 L heel/Achilles pain at worst for the past 3 months (09/04/2021);  5/10 at worst (10/07/2021); 4/10 at most for the past 7 days, most of the time, 2/10, worst is getting up in the middle of the night to go to the bathroom (10/29/2021), 2/10 at most for the pst 7 days (12/02/2021); 1/10 at most for the past 7 days (01/05/2022)    Time 8    Period Weeks    Status Achieved    Target Date 10/30/21      PT LONG TERM GOAL #2   Title Pt will improve bilateral hip extension and abduction strength by at least 1/2 MMT grade to promote femoral control and ability to ambulate and perform closed chain tasks with less L heel/Achilles pain.    Baseline Hip extension 4-/5 R, 3+/5 L, hip abduction 3-/5 R, 4-/5 L (09/04/2021); 4+/5 R, 4/5 L, hip abduction 4-/5 R, 4/5 L (L knee pain) 10/09/2021; Hip extension 4+/5 R, 4/5 L. Hip abduction in S/L  not tested secondary to L knee pain  (10/29/2021)    Time 3    Period Weeks    Status Partially Met    Target Date 01/29/22      PT LONG TERM GOAL #3   Title Pt will improve her L foot FOTO score by at least 10 points as a demonstration of improved function.    Baseline L foot FOTO 38 (09/04/2021); 43 (10/29/2021); 53 (12/02/2021)    Time 8    Period Weeks    Status Achieved     Target Date 12/25/21      PT LONG TERM GOAL #4   Title Pt will have a decrease in neck pain to 2/10 or less at worst to promote ability to look around more comfortably.    Baseline 5/10 neck pain at worst for the past 3 months (09/04/2021), (10/07/2021), 1.5/10 at most for the past 7 day (10/29/2021); 3.5/10 at most for the past 7 days when turning her head. (01/05/2022)    Time 3    Period Weeks    Status Partially Met    Target Date 01/29/22      PT LONG TERM GOAL #5   Title Patient will have a decrease in L knee pain to 2/10 or less at worst promote better mechanics for L heel during gait.    Baseline 4/10 L knee pain at most for the past 3 weeks (12/02/2021); 2/10 (01/05/2022)    Time 3    Period Weeks    Status Partially Met    Target Date 01/29/22                   Plan - 01/05/22 1423     Clinical Impression Statement Pt demonstrates significant decrease in L heel and knee pain since initial evaluation. Focused mainly on treating neck pain today secondary to reports of increased discomfort. Worked on decreasing muscle  tension to cervical paraspinal and upper trap muscles as well as decreasing lower cervical extension compensation to improve cervical mechanics. Decreased neck pain reported after session.  Pt will benefit from continued skilled physical therapy services to decrease pain, improve strength and function.    Personal Factors and Comorbidities Age;Comorbidity 3+;Time since onset of injury/illness/exacerbation;Fitness    Comorbidities Osteopenia, chronic venous insufficiency,Hx of basal cell CA S/P lumpectomy, HTN    Examination-Activity Limitations Stand;Locomotion Level;Squat;Stairs;Carry;Lift    Stability/Clinical Decision Making Stable/Uncomplicated    Clinical Decision Making Low    Rehab Potential Fair    Clinical Impairments Affecting Rehab Potential Chronicity of condition, age, fitness level    PT Frequency 2x / week    PT Duration 4 weeks    PT  Treatment/Interventions Aquatic Therapy;Electrical Stimulation;Iontophoresis 4mg /ml Dexamethasone;Gait training;Stair training;Functional mobility training;Therapeutic activities;Therapeutic exercise;Balance training;Neuromuscular re-education;Patient/family education;Manual techniques;Dry needling    PT Next Visit Plan isometric plantar flexion, glute, trunk, scapular strengthening, femoral control, manual techniques, modalities PRN    PT Home Exercise Plan no changes today    Consulted and Agree with Plan of Care Patient             Patient will benefit from skilled therapeutic intervention in order to improve the following deficits and impairments:  Postural dysfunction, Decreased strength, Improper body mechanics, Pain, Difficulty walking  Visit Diagnosis: Difficulty in walking, not elsewhere classified - Plan: PT plan of care cert/re-cert  Pain in left foot - Plan: PT plan of care cert/re-cert  Cervicalgia - Plan: PT plan of care cert/re-cert  Chronic pain of left knee - Plan: PT plan of care cert/re-cert     Problem List Patient Active Problem List   Diagnosis Date Noted   Venous ulcer of ankle, unspecified laterality ([place]) 12/24/2021   Bilateral lower extremity edema 12/17/2021   Acute respiratory infection 10/06/2021   Pre-op evaluation 06/02/2021   Right wrist pain 02/28/2021   Skin rash 02/28/2021   Ascending aorta dilatation ([place]) 01/10/2021   Left-sided chest pain 01/10/2021   Genetic testing 10/30/2020   Malignant neoplasm of overlapping sites of left breast in female, estrogen receptor positive ([place]) 10/22/2020   Family history of breast cancer    Family history of thyroid cancer    Insertional Achilles tendinopathy 08/28/2020   Breast cancer, left breast ([place]) 08/01/2020   Right carotid bruit 06/05/2020   Chest pressure 06/03/2020   Left Achilles tendinitis 09/01/2019   Low back pain 08/29/2019   Lumbosacral spondylosis without myelopathy 08/29/2019    Calcaneal spur 08/17/2019   Contusion of knee 10/04/2018   Varicose veins of bilateral lower extremities with pain 03/30/2018   Lymphedema 03/30/2018   Dizziness 02/02/2017   Situational depression 12/18/2016   Vitamin D deficiency 10/11/2016   Fatigue 08/12/2016   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 04/14/2016   Essential hypertension 12/17/2015   Dermatitis of external ear 12/17/2015   [place] of foot 05/16/2015   Advanced care planning/counseling discussion 10/30/2014   Neck pain on right side 06/25/2014   Primary osteoarthritis of left knee 08/17/2013   Oropharyngeal dysphagia 11/15/2012   Stressful life events affecting family and household 03/30/2012   Medicare annual wellness visit, subsequent 03/30/2012   History of basal cell carcinoma    Osteoarthritis    Osteopenia    Hypothyroidism    Glaucoma    Chronic venous insufficiency      Joneen Boers PT, DPT   01/05/2022, 2:43 PM  [facility] [address] [place], [address] Phone: [phone]   Fax:  [phone]  Name: AMADA HALLISEY MRN: [id number] Date of Birth: 1935/10/26

## 2022-01-05 NOTE — Addendum Note (Signed)
Addended by: Madaline Savage on: 01/05/2022 02:43 PM   Modules accepted: Orders

## 2022-01-06 ENCOUNTER — Ambulatory Visit (INDEPENDENT_AMBULATORY_CARE_PROVIDER_SITE_OTHER): Payer: Medicare Other | Admitting: Nurse Practitioner

## 2022-01-06 ENCOUNTER — Ambulatory Visit (INDEPENDENT_AMBULATORY_CARE_PROVIDER_SITE_OTHER): Payer: Medicare Other | Admitting: Psychology

## 2022-01-06 ENCOUNTER — Encounter: Payer: Self-pay | Admitting: Occupational Therapy

## 2022-01-06 ENCOUNTER — Ambulatory Visit: Payer: Medicare Other | Admitting: Psychology

## 2022-01-06 ENCOUNTER — Ambulatory Visit: Payer: Medicare Other | Admitting: Occupational Therapy

## 2022-01-06 DIAGNOSIS — M79672 Pain in left foot: Secondary | ICD-10-CM | POA: Diagnosis not present

## 2022-01-06 DIAGNOSIS — F4323 Adjustment disorder with mixed anxiety and depressed mood: Secondary | ICD-10-CM | POA: Diagnosis not present

## 2022-01-06 DIAGNOSIS — M542 Cervicalgia: Secondary | ICD-10-CM | POA: Diagnosis not present

## 2022-01-06 DIAGNOSIS — R262 Difficulty in walking, not elsewhere classified: Secondary | ICD-10-CM | POA: Diagnosis not present

## 2022-01-06 DIAGNOSIS — I890 Lymphedema, not elsewhere classified: Secondary | ICD-10-CM | POA: Diagnosis not present

## 2022-01-06 DIAGNOSIS — M25562 Pain in left knee: Secondary | ICD-10-CM | POA: Diagnosis not present

## 2022-01-06 DIAGNOSIS — I879 Disorder of vein, unspecified: Secondary | ICD-10-CM | POA: Diagnosis not present

## 2022-01-06 DIAGNOSIS — G8929 Other chronic pain: Secondary | ICD-10-CM | POA: Diagnosis not present

## 2022-01-06 NOTE — Progress Notes (Signed)
[place] Counselor/Therapist Progress Note  Patient ID: Beth Rodgers, MRN: [id number]    Date: 01/06/22  Time Spent: 12:43 pm - 1:35 pm : 52 Minutes  Treatment Type: Individual Therapy.  Reported Symptoms: Rumination, anxiety.    Mental Status Exam: Appearance:  NA     Behavior: Appropriate  Motor: NA  Speech/Language:  Clear and Coherent and Normal Rate  Affect: Congruent  Mood: dysthymic  Thought process: normal  Thought content:   WNL  Sensory/Perceptual disturbances:   WNL  Orientation: oriented to person, place, time/date, and situation  Attention: Good  Concentration: Good  Memory: WNL  Fund of knowledge:  Good  Insight:   Good  Judgment:  Good  Impulse Control: Good   Risk Assessment: Danger to Self:  No Self-injurious Behavior: No Danger to Others: No Duty to Warn:no Physical Aggression / Violence:No  Access to Firearms a concern: No  Gang Involvement:No   Subjective:   Beth Rodgers participated from home, via phone, and consented to treatment. Therapist participated from office. We met online due to [place] pandemic. Beth Rodgers reviewed the events of the past two weeks.  Beth Rodgers discussed recent health complications during the session, which has increased her worry, regarding her related upcoming surgical procedure that could be delayed.  We worked on processing her feelings regarding her health.  Beth Rodgers continues to experience feelings of loss and grief regarding her grandchildren who are estranged due to her ex daughter-in-law's choice.  Beth Rodgers discussed considerable anxiety regarding her grandson's health who has a significant health issue which was recently reported to Beth Rodgers.  She should previously noted that CPS was currently searching for her grandchild to ensure that he is receiving appropriate medical treatment.  We worked on processing her loss and grief and identifying areas of control and lack of control.  This also applied to her son's behavior and his  refusal to receive mental health treatment for his severe unaddressed bipolar disorder.  Worked on identifying areas of focus and ways to reduce rumination, working on acceptance, and addressing issues as they arise.  Beth Rodgers was engaged and motivated during the session and expressed commitment towards her goals.  Therapist validated Beth Rodgers experiencing feelings during the session and provided supportive therapy.  Beth Rodgers continues to benefit from individual therapy and future appointments had been scheduled.  Beth Rodgers discussed increased health issues and the need for additional medical treatment as a result.    Interventions: Interpersonal  Diagnosis: Adjustment disorder with mixed anxiety and depressed mood   Plan: Patient is to use CBT, BA, Problem-solving, mindfulness and coping skills to help manage decrease symptoms associated with their diagnosis. (Target date: 08/16/22)   Long-term goal:   Reduce overall level, frequency, and intensity of the feelings of depression and anxiety evidenced by   decreased sadness, rumination, lethargy, and interpersonal stressors  from 6 to 7 days/week to 0 to 1 days/week per client report for at least 3 consecutive months.  Short-term goal:  Verbally express understanding of the relationship between feelings of depression, anxiety and their impact on thinking patterns and behaviors.  Verbalize an understanding of the role that distorted thinking plays in creating fears, excessive worry, and ruminations. Engage in enjoyable activities on a consistent basis.  [name], LCSW

## 2022-01-06 NOTE — Therapy (Signed)
[facility] [address], [place], [address] Phone: [phone]   Fax:  [phone]  Occupational Therapy Evaluation  Patient Details  Name: Beth Rodgers MRN: [id number] Date of Birth: [birth year] Referring Provider (OT): Cintron   Encounter Date: 01/06/2022   OT End of Session - 01/06/22 1807     Visit Number 1    Number of Visits 4    Date for OT Re-Evaluation 03/31/22    OT Start Time 1400    OT Stop Time 6222    OT Time Calculation (min) 75 min    Activity Tolerance Patient tolerated treatment well    Behavior During Therapy [facility] for tasks assessed/performed             Past Medical History:  Diagnosis Date   Basal cell carcinoma ([place])    txted with MOHs in past   Cellulitis 08/10/2018   Chronic venous insufficiency    with varicose veins   Family history of breast cancer    Family history of thyroid cancer    GERD (gastroesophageal reflux disease)    Glaucoma    History  of basal cell carcinoma    left nare/BREAST CANCER   History of chicken pox    Hypertension    Hypoglycemia    Hypothyroid    Osteoarthritis    back pain, L knee pain   Osteopenia 06/2009   DEXA 11/2015: T -2.3 hip, -2.2 spine, on longterm bisphosphonate/evista   Psoriasis    Pyogenic granuloma 04/16/2016    Past Surgical History:  Procedure Laterality Date   BASAL CELL CARCINOMA EXCISION     located on face   BREAST BIOPSY Right    core done ing [place]?   BREAST BIOPSY Left 08/16/2020   3 area bx 4:00 X, IMC, 6:00Q IMC, LN hydro #3-benign   BREAST LUMPECTOMY Left 09/11/2020   two areas of [facility]   CATARACT EXTRACTION     bilateral   DEXA  06/2009   T score Spine: -1.8, hip -2.0   EXCISION OF BREAST BIOPSY Left 09/11/2020   Procedure: EXCISION OF BREAST BIOPSY;  Surgeon: [name], MD;  Location: [facility] ORS;  Service: General;  Laterality: Left;   Foam sclerotherapy Bilateral 09/2015    [place], [place]   MASTECTOMY W/ SENTINEL NODE BIOPSY Left 06/25/2021   Procedure: MASTECTOMY WITH SENTINEL LYMPH NODE BIOPSY;  Surgeon: [name], MD;  Location: [facility] ORS;  Service: General;  Laterality: Left;   NASAL RECONSTRUCTION  2005   for [place] L nare s/p Mohs   nuclear stress test  2014   normal in [place] [facility] Left 09/11/2020   Procedure: PARTIAL MASTECTOMY WITH NEEDLE LOCALIZATION AND AXILLARY SENTINEL LYMPH NODE [place];  Surgeon: [name], MD;  Location: [facility] ORS;  Service: General;  Laterality: Left;   RADIOFREQUENCY ABLATION Left 01/2012   remnant L G saphenous vein below knee   RADIOFREQUENCY ABLATION Right 02/2013   RLE vein ablation    VEIN LIGATION AND STRIPPING  remote   bilateral G saphenous veins    There were no vitals filed for this visit.   Subjective Assessment - 01/06/22 1626     Subjective  I am her for my arm - and then if you can look at my legs too- I have lymphedema all over my body- I looked on computer- they are all over you body -  this skin rash I think from lymhedem- what do you think -and then this pocket under my L breast went down when the fluid leaked and I got the ulcers on my legs- My L upper arm feels tender too    Pertinent History seen DR Citron on 12/15/21 - s/p mastectomy 06/25/21 - Seroma of breast (N64.89)  -Patient concerned about recurrence of seroma of the left chest. The left chest wound is adequately healed. There is no fluid collection. I discussed with the patient that this one of the lower extremities is due to venous insufficiency and the swelling of the left chest is usually due to lymphatic insufficiency. These are 2 different things. I also discussed with patient that the swelling of the chest was not radiated to the lower extremity either. She felt more comfortable with the discussion. She will continue seeing vascular surgery for  wound care.     PLAN:   1. The swelling of the chest is not related to the swelling of your legs.  2. Continue treatment of leg swelling by vascular service  - pt end of Dec refer to OT for lymphedema    Patient Stated Goals Want to know what to do for the lymphedema in my legs, under my breast and arm    Currently in Pain? No/denies               [facility] OT Assessment - 01/06/22 0001       Assessment   Medical Diagnosis Lymphedema    Referring Provider (OT) Cintron    Onset Date/Surgical Date 06/25/21    Hand Dominance Right      Home  Environment   Lives With Spouse      Prior Function   Vocation Retired    Leisure Dispensing optician              LYMPHEDEMA/ONCOLOGY QUESTIONNAIRE - 01/06/22 0001       Right Upper Extremity Lymphedema   15 cm Proximal to Olecranon Process 31 cm    10 cm Proximal to Olecranon Process 29 cm    Olecranon Process 23 cm    15 cm Proximal to Ulnar Styloid Process 20.5 cm    10 cm Proximal to Ulnar Styloid Process 18 cm    Just Proximal to Ulnar Styloid Process 14 cm    Across Hand at [employer] 17.4 cm    At [place] of 2nd Digit 5.5 cm    At [facility] of Thumb 5.5 cm      Left Upper Extremity Lymphedema   15 cm Proximal to Olecranon Process 31 cm    10 cm Proximal to Olecranon Process 29 cm    Olecranon Process 22.8 cm    15 cm Proximal to Ulnar Styloid Process 20.6 cm    10 cm Proximal to Ulnar Styloid Process 18.5 cm    Just Proximal to Ulnar Styloid Process 14.5 cm    Across Hand at [employer] 17 cm    At [place] of 2nd Digit 5.6 cm    At [facility] of Thumb 5.7 cm      Right Lower Extremity Lymphedema   At Midpatella/Popliteal Crease 38.5 cm    30 cm Proximal to Floor at Lateral Plantar Foot 39 cm    20 cm Proximal to Floor at Lateral Plantar Foot 31 1    Circumference of ankle/heel 31.2 cm.    5 cm Proximal to 1st MTP Joint 20.5 cm  Left Lower Extremity Lymphedema   At Midpatella/Popliteal Crease 40.5 cm    30 cm Proximal to  Floor at Lateral Plantar Foot 38.5 cm    20 cm Proximal to Floor at Lateral Plantar Foot 32 cm    Circumference of ankle/heel 30.5 cm.    5 cm Proximal to 1st MTP Joint 20.4 cm                            OT Education - 01/06/22 1806     Education Details Lymphedema symptoms, treatmen and precautions    Person(s) Educated Patient    Methods Explanation;Demonstration;Tactile cues;Verbal cues;Handout    Comprehension Verbal cues required;Returned demonstration;Verbalized understanding                 OT Long Term Goals - 01/06/22 1817       OT LONG TERM GOAL #1   Title Pt to verbalize understanding of signs, symptoms , precautions of Lymphedema and wearing of preventitive sleeve    Baseline Pt read info on lymphedema on computer - in need for educations on signs, symptoms, anatomy, management    Time 12    Period Weeks    Status New    Target Date 03/31/22                   Plan - 01/06/22 1808     Clinical Impression Statement Pt refer to OT with diagnosis of lymphedema. Pt had L mastectomy done June 22- and 2 ln removed , not radiation. Pt also present with bilateral below the knee compression stockings with zip that she wear daily- has long standing vascular history, cellulitis and ulcer history. Pt report having area under her L breast that was swollen and that decrease during the last treatment for her legs. Feels like the fluid moved into her legs. Measurements were taken for bilateral UE and LE. Pt ed on anatomy, symptoms, treatment and precautions of lymphedema. Pt to cont with bilateral LE below knee compression stockings- ed not to roll the top causing tourniquet , cont with pump and elevation as per vascular MD. Pt do not show signs of lymphedema in L UE and chest - pt ed signs and symptoms - that her risk is low. Pt would need L UE preventitive sleeve and gauntlet prior to flying to [place]. Pt feels better after session, more  peace of mind and will cont at home to monitor - to follow up with me in month if needed.    OT Occupational Profile and History Problem Focused Assessment - Including review of records relating to presenting problem    Occupational performance deficits (Please refer to evaluation for details): ADL's;IADL's;Leisure    Body Structure / Function / Physical Skills Edema;ADL    Rehab Potential Good    Clinical Decision Making Limited treatment options, no task modification necessary    Comorbidities Affecting Occupational Performance: None    Modification or Assistance to Complete Evaluation  No modification of tasks or assist necessary to complete eval    OT Frequency Monthly    OT Duration 12 weeks    OT Treatment/Interventions Self-care/ADL training;Patient/family education;Compression bandaging;Manual lymph drainage    Consulted and Agree with Plan of Care Patient             Patient will benefit from skilled therapeutic intervention in order to improve the following deficits and impairments:   Body Structure / Function / Physical  Skills: Edema, ADL       Visit Diagnosis: Lymphedema, not elsewhere classified - Plan: Ot plan of care cert/re-cert    Problem List Patient Active Problem List   Diagnosis Date Noted   Venous ulcer of ankle, unspecified laterality ([place]) 12/24/2021   Bilateral lower extremity edema 12/17/2021   Acute respiratory infection 10/06/2021   Pre-op evaluation 06/02/2021   Right wrist pain 02/28/2021   Skin rash 02/28/2021   Ascending aorta dilatation ([employer]) 01/10/2021   Left-sided chest pain 01/10/2021   Genetic testing 10/30/2020   Malignant neoplasm of overlapping sites of left breast in female, estrogen receptor positive ([place]) 10/22/2020   Family history of breast cancer    Family history of thyroid cancer    Insertional Achilles tendinopathy 08/28/2020   Breast cancer, left breast ([place]) 08/01/2020   Right carotid bruit 06/05/2020   Chest pressure  06/03/2020   Left Achilles tendinitis 09/01/2019   Low back pain 08/29/2019   Lumbosacral spondylosis without myelopathy 08/29/2019   Calcaneal spur 08/17/2019   Contusion of knee 10/04/2018   Varicose veins of bilateral lower extremities with pain 03/30/2018   Lymphedema 03/30/2018   Dizziness 02/02/2017   Situational depression 12/18/2016   Vitamin D deficiency 10/11/2016   Fatigue 08/12/2016   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 04/14/2016   Essential hypertension 12/17/2015   Dermatitis of external ear 12/17/2015   [place] of foot 05/16/2015   Advanced care planning/counseling discussion 10/30/2014   Neck pain on right side 06/25/2014   Primary osteoarthritis of left knee 08/17/2013   Oropharyngeal dysphagia 11/15/2012   Stressful life events affecting family and household 03/30/2012   Medicare annual wellness visit, subsequent 03/30/2012   History of basal cell carcinoma    Osteoarthritis    Osteopenia    Hypothyroidism    Glaucoma    Chronic venous insufficiency     Rosalyn Gess, OTR/L,CLT 01/06/2022, 6:22 PM  [facility] [address] [place], [address] Phone: [phone]   Fax:  [phone]  Name: Beth Rodgers MRN: [id number] Date of Birth: [birth year]

## 2022-01-07 ENCOUNTER — Ambulatory Visit (INDEPENDENT_AMBULATORY_CARE_PROVIDER_SITE_OTHER): Payer: Medicare Other | Admitting: Dermatology

## 2022-01-07 ENCOUNTER — Other Ambulatory Visit: Payer: Self-pay

## 2022-01-07 DIAGNOSIS — I8312 Varicose veins of left lower extremity with inflammation: Secondary | ICD-10-CM

## 2022-01-07 DIAGNOSIS — I872 Venous insufficiency (chronic) (peripheral): Secondary | ICD-10-CM

## 2022-01-07 DIAGNOSIS — I8311 Varicose veins of right lower extremity with inflammation: Secondary | ICD-10-CM

## 2022-01-07 DIAGNOSIS — L409 Psoriasis, unspecified: Secondary | ICD-10-CM | POA: Diagnosis not present

## 2022-01-07 NOTE — Patient Instructions (Addendum)
Stasis in the legs causes chronic leg swelling, which may result in itchy or painful rashes, skin discoloration, skin texture changes, and sometimes ulceration.  Recommend daily graduated compression hose/stockings- easiest to put on first thing in morning, remove at bedtime.  Elevate legs as much as possible. Avoid salt/sodium rich foods.    Start Zoryve Cream - Apply to affected areas lower legs once a day. May also use on red areas (psoriasis) on the arms, back once daily.    If You Need Anything After Your Visit  If you have any questions or concerns for your doctor, please call our main line at [phone] and press option 4 to reach your doctor's medical assistant. If no one answers, please leave a voicemail as directed and we will return your call as soon as possible. Messages left after 4 pm will be answered the following business day.   You may also send [place] a message via [place]. We typically respond to MyChart messages within 1-2 business days.  For prescription refills, please ask your pharmacy to contact our office. Our fax number is [phone].  If you have an urgent issue when the clinic is closed that cannot wait until the next business day, you can page your doctor at the number below.    Please note that while we do our best to be available for urgent issues outside of office hours, we are not available 24/7.   If you have an urgent issue and are unable to reach [place], you may choose to seek medical care at your doctor's office, retail clinic, urgent care center, or emergency room.  If you have a medical emergency, please immediately call 911 or go to the emergency department.  Pager Numbers  - Dr. Nehemiah Massed: [phone]  - Dr. Laurence Ferrari: [phone]  - Dr. Nicole Kindred: [phone]  In the event of inclement weather, please call our main line at [phone] for an update on the status of any delays or closures.  Dermatology Medication Tips: Please keep the boxes that  topical medications come in in order to help keep track of the instructions about where and how to use these. Pharmacies typically print the medication instructions only on the boxes and not directly on the medication tubes.   If your medication is too expensive, please contact our office at [phone] option 4 or send [place] a message through [place].   We are unable to tell what your co-pay for medications will be in advance as this is different depending on your insurance coverage. However, we may be able to find a substitute medication at lower cost or fill out paperwork to get insurance to cover a needed medication.   If a prior authorization is required to get your medication covered by your insurance company, please allow [place] 1-2 business days to complete this process.  Drug prices often vary depending on where the prescription is filled and some pharmacies may offer cheaper prices.  The website www.goodrx.com contains coupons for medications through different pharmacies. The prices here do not account for what the cost may be with help from insurance (it may be cheaper with your insurance), but the website can give you the price if you did not use any insurance.  - You can print the associated coupon and take it with your prescription to the pharmacy.  - You may also stop by our office during regular business hours and pick up a GoodRx coupon card.  - If you need your prescription sent electronically to a  different pharmacy, notify our office through [facility] or by phone at [phone] option 4.     Si Usted Necesita Algo Despus de Su Visita  Tambin puede enviarnos un mensaje a travs de Pharmacist, community. Por lo general respondemos a los mensajes de MyChart en el transcurso de 1 a 2 das hbiles.  Para renovar recetas, por favor pida a su farmacia que se ponga en contacto con nuestra oficina. Harland Dingwall de fax es [place] [phone].  Si tiene un asunto urgente cuando la clnica  est cerrada y que no puede esperar hasta el siguiente da hbil, puede llamar/localizar a su doctor(a) al nmero que aparece a continuacin.   Por favor, tenga en cuenta que aunque hacemos todo lo posible para estar disponibles para asuntos urgentes fuera del horario de [place], no estamos disponibles las 24 horas del da, los 7 das de la [place].   Si tiene un problema urgente y no puede comunicarse con nosotros, puede optar por buscar atencin mdica  en el consultorio de su doctor(a), en una clnica privada, en un centro de atencin urgente o en una sala de emergencias.  Si tiene Engineering geologist, por favor llame inmediatamente al 911 o vaya a la sala de emergencias.  Nmeros de bper  - Dr. Nehemiah Massed: [phone]  - Dra. Moye: [phone]  - Dra. Nicole Kindred: [phone]  En caso de inclemencias del [place], por favor llame a Johnsie Kindred principal al [phone] para una actualizacin sobre el [place] de cualquier retraso o cierre.  Consejos para la medicacin en dermatologa: Por favor, guarde las cajas en las que vienen los medicamentos de uso tpico para ayudarle a seguir las instrucciones sobre dnde y cmo usarlos. Las farmacias generalmente imprimen las instrucciones del medicamento slo en las cajas y no directamente en los tubos del [place].   Si su medicamento es muy caro, por favor, pngase en contacto con Zigmund Daniel llamando al [phone] y presione la opcin 4 o envenos un mensaje a travs de Pharmacist, community.   No podemos decirle cul ser su copago por los medicamentos por adelantado ya que esto es diferente dependiendo de la cobertura de su seguro. Sin embargo, es posible que podamos encontrar un medicamento sustituto a Electrical engineer un formulario para que el seguro cubra el medicamento que se considera necesario.   Si se requiere una autorizacin previa para que su compaa de seguros [place] su medicamento, por favor permtanos de 1 a 2 das hbiles para  completar este proceso.  Los precios de los medicamentos varan con frecuencia dependiendo del Environmental consultant de dnde se surte la receta y alguna farmacias pueden ofrecer precios ms baratos.  El sitio web www.goodrx.com tiene cupones para medicamentos de Airline pilot. Los precios aqu no tienen en cuenta lo que podra costar con la ayuda del seguro (puede ser ms barato con su seguro), pero el sitio web puede darle el precio si no utiliz Research scientist (physical sciences).  - Puede imprimir el cupn correspondiente y llevarlo con su receta a la farmacia.  - Tambin puede pasar por nuestra oficina durante el horario de atencin regular y Charity fundraiser una tarjeta de cupones de GoodRx.  - Si necesita que su receta se enve electrnicamente a una farmacia diferente, informe a nuestra oficina a travs de MyChart de [place] o por telfono llamando al [phone] y presione la opcin 4.

## 2022-01-07 NOTE — Progress Notes (Signed)
° °  Follow-Up Visit   Subjective  Beth Rodgers is a 87 y.o. female who presents for the following: Stasis Dermatitis with Lipodermatosclerosis (Lower legs. Patient complains of discomfort and pain of the lower legs. She has a history of cellulitis and recently finished course of Cephalexin. She uses Silvadene, CeraVe Ointment, and compression to lower legs daily. ) and Psoriasis (Back, arms, postauricular. Patient is flared, but not wanting to use steroid creams much due to thinning of the skin. Occasionally she uses TMC cream and Clobetasol cream. She uses CeraVe Itch relief, as needed. ). Tacrolimus ointment irritates skin, so not using. She received samples of Eucrisa Ointment from another doctor, but not covered by insurance.    The following portions of the chart were reviewed this encounter and updated as appropriate:       Review of Systems:  No other skin or systemic complaints except as noted in HPI or Assessment and Plan.  Objective  Well appearing patient in no apparent distress; mood and affect are within normal limits.  A focused examination was performed including lower legs, arms, back, face, ears. Relevant physical exam findings are noted in the Assessment and Plan.  lower legs Erythema and scale of the right medial ankle; hyperpigmented firm indurated plaque on the ankle and lower pretibial bilateral.  arms, back Pink scaly papules and plaques of the back, arms L > R, legs    Assessment & Plan  Stasis dermatitis of both legs lower legs  With Lipodermatosclerosis - Discussed chronic condition, no cure  Stasis in the legs causes chronic leg swelling, which may result in itchy or painful rashes, skin discoloration, skin texture changes, and sometimes ulceration.  Recommend daily graduated compression hose/stockings- easiest to put on first thing in morning, remove at bedtime.  Elevate legs as much as possible. Avoid salt/sodium rich foods. Continue lymphedema pumps 1-2x  daily. Continue CeraVe Ointment daily. Continue daily compression hose   Patient does not like treating legs with a topical steroid due to side effect of skin atrophy.  Start Zoryve Cream (trade sample given to patient) Apply to affected areas legs once a day.  Continue Silavadene to AA prn.      Psoriasis arms, back  With flare  Psoriasis is a chronic non-curable, but treatable genetic/hereditary disease that may have other systemic features affecting other organ systems such as joints (Psoriatic Arthritis). It is associated with an increased risk of inflammatory bowel disease, heart disease, non-alcoholic fatty liver disease, and depression.    Patient does not like to use topical steroids much due to side effect of skin atrophy.  Start Zoryve Cream Apply to AA once daily until improved. Trade sample given to patient today.   Continue CeraVe Itch Relief moisturizer, as needed.    Related Medications clobetasol cream (TEMOVATE) 4.48 % Apply 1 application topically as directed. Qd to bid up to 5 days a week to aa psoriasis on body until clear, then prn flares, avoid face, groin, axilla   Return in about 1 month (around 02/07/2022) for Psoriasis, Stasis Derm.  IJamesetta Orleans, CMA, am acting as scribe for [name], MD . Documentation: I have reviewed the above documentation for accuracy and completeness, and I agree with the above.  [name] MD

## 2022-01-08 ENCOUNTER — Ambulatory Visit: Payer: Medicare Other

## 2022-01-08 DIAGNOSIS — I890 Lymphedema, not elsewhere classified: Secondary | ICD-10-CM | POA: Diagnosis not present

## 2022-01-08 DIAGNOSIS — M25562 Pain in left knee: Secondary | ICD-10-CM | POA: Diagnosis not present

## 2022-01-08 DIAGNOSIS — G8929 Other chronic pain: Secondary | ICD-10-CM | POA: Diagnosis not present

## 2022-01-08 DIAGNOSIS — M79672 Pain in left foot: Secondary | ICD-10-CM | POA: Diagnosis not present

## 2022-01-08 DIAGNOSIS — M542 Cervicalgia: Secondary | ICD-10-CM

## 2022-01-08 DIAGNOSIS — R262 Difficulty in walking, not elsewhere classified: Secondary | ICD-10-CM | POA: Diagnosis not present

## 2022-01-08 NOTE — Therapy (Signed)
[facility] [address], [place], [address] Phone: [phone]   Fax:  [phone]  Physical Therapy Treatment  Patient Details  Name: Beth Rodgers MRN: [id number] Date of Birth: 22-May-1935 Referring Provider (PT): Owens Loffler, MD   Encounter Date: 01/08/2022   PT End of Session - 01/08/22 1421     Visit Number 25    Number of Visits 25    Date for PT Re-Evaluation 01/29/22    Authorization Type Medicare    Progress Note Due on Visit 74    PT Start Time 1424    PT Stop Time 4627    PT Time Calculation (min) 34 min    Activity Tolerance Patient tolerated treatment well    Behavior During Therapy [facility] for tasks assessed/performed             Past Medical History:  Diagnosis Date   Basal cell carcinoma ([place])    txted with MOHs in past   Cellulitis 08/10/2018   Chronic venous insufficiency    with varicose veins   Family history of breast cancer    Family history of thyroid cancer    GERD (gastroesophageal reflux disease)    Glaucoma    History  of basal cell carcinoma    left nare/BREAST CANCER   History of chicken pox    Hypertension    Hypoglycemia    Hypothyroid    Osteoarthritis    back pain, L knee pain   Osteopenia 06/2009   DEXA 11/2015: T -2.3 hip, -2.2 spine, on longterm bisphosphonate/evista   Psoriasis    Pyogenic granuloma 04/16/2016    Past Surgical History:  Procedure Laterality Date   BASAL CELL CARCINOMA EXCISION     located on face   BREAST BIOPSY Right    core done ing [place]?   BREAST BIOPSY Left 08/16/2020   3 area bx 4:00 X, IMC, 6:00Q IMC, LN hydro #3-benign   BREAST LUMPECTOMY Left 09/11/2020   two areas of [facility]   CATARACT EXTRACTION     bilateral   DEXA  06/2009   T score Spine: -1.8, hip -2.0   EXCISION OF BREAST BIOPSY Left 09/11/2020   Procedure: EXCISION OF BREAST BIOPSY;  Surgeon: [name], MD;  Location: [facility] ORS;  Service:  General;  Laterality: Left;   Foam sclerotherapy Bilateral 09/2015   Reed Pandy, [place]   MASTECTOMY W/ SENTINEL NODE BIOPSY Left 06/25/2021   Procedure: MASTECTOMY WITH SENTINEL LYMPH NODE BIOPSY;  Surgeon: [name], MD;  Location: [facility] ORS;  Service: General;  Laterality: Left;   NASAL RECONSTRUCTION  2005   for [place] L nare s/p Mohs   nuclear stress test  2014   normal in [place] BX Left 09/11/2020   Procedure: PARTIAL MASTECTOMY WITH NEEDLE LOCALIZATION AND AXILLARY SENTINEL LYMPH NODE [place];  Surgeon: [name], MD;  Location: [facility] ORS;  Service: General;  Laterality: Left;   RADIOFREQUENCY ABLATION Left 01/2012   remnant L G saphenous vein below knee   RADIOFREQUENCY ABLATION Right 02/2013   RLE vein ablation    VEIN LIGATION AND STRIPPING  remote   bilateral G saphenous veins    There were no vitals filed for this visit.   Subjective Assessment - 01/08/22 1424     Subjective Pt states having cellulitis B legs. Neck is a bit better. Still a bit stiff, no pain at rest,  3/10 with R and L cervical rotation. L Achilles is doing well. L knee is improving too. Feels like her balance is better.    Pertinent History Neck and achilles pain. L knee also bothers her. Had injection L knee which helped 10 days before her breast surgery June 25, 2021. Dr. [name] also states Pt needs surgery for her L knee (TKA). Pt however has a heel spur on L foot which is chronic. Went to 3 specialists for heel spur and was told that there is nothing can be done surgically. The heel pain however affects her gait which affects her L knee increasing L knee pain. Pt continues to do her HEP from previous PT. Neck pain began about 6 months ago gradually. Started feeling tightness from R medial shoulder blade to neck. Pt is R hand dominant. L foot bothers her more currently.    Currently in Pain? Yes    Pain Score  3     Pain Location Neck                                        PT Education - 01/08/22 1620     Education Details ther-ex    Person(s) Educated Patient    Methods Explanation;Demonstration;Tactile cues;Verbal cues    Comprehension Returned demonstration;Verbalized understanding           Objective     No latex band allergies   B varicose veins legs Osteopenia     L heel pain Hx of L mastectomy on June 25, 2021 and had home health PT for recovery.      Started using a SPC 5 months ago when ambulating by herself. Furniture walk at home.    Med[employer] Access Code [email or web address]     Manual therapy   Seated STM R cervical paraspinal and B upper trap muscles to decrease tension    Seated STM R upper trap muscle to decrease fascial restrictions  Decreased pain with cervical rotation        Therapeutic exercise   Seated B scapular retraction 10x5 seconds for 3 sets  Seated chin tuck 10x5 seconds for 2 sets  R and L cervical rotation with cues to decrease lower cervical extension compensation 10x3            Improved exercise technique, movement at target joints, use of target muscles after min to mod verbal, visual, tactile cues.        Response to treatment Pt tolerated session well without aggravation of symptoms.  Decreased neck pain with cervical rotation after session.        Clinical Impression Decrease neck pain with cervical rotation with treatment to decrease muscle tension and fascial restrictions R > L cervical paraspinal and upper trap areas. Pt will benefit from continued skilled physical therapy services to decrease pain, improve strength and function.            PT Short Term Goals - 10/09/21 1422       PT SHORT TERM GOAL #1   Title Patient will be independent with her initial HEP to improve strength, decrease neck and L heel pain, improve function.    Baseline Does her HEP, no questions (10/09/2021)    Time  3    Period Weeks    Status Achieved    Target Date 09/25/21  PT Long Term Goals - 01/05/22 1341       PT LONG TERM GOAL #1   Title Pt will have a decrease in L heel pain to 4/10 or less at worst to promote ability to ambulate, perform standing tasks more comfortably.    Baseline 8/10 L heel/Achilles pain at worst for the past 3 months (09/04/2021);  5/10 at worst (10/07/2021); 4/10 at most for the past 7 days, most of the time, 2/10, worst is getting up in the middle of the night to go to the bathroom (10/29/2021), 2/10 at most for the pst 7 days (12/02/2021); 1/10 at most for the past 7 days (01/05/2022)    Time 8    Period Weeks    Status Achieved    Target Date 10/30/21      PT LONG TERM GOAL #2   Title Pt will improve bilateral hip extension and abduction strength by at least 1/2 MMT grade to promote femoral control and ability to ambulate and perform closed chain tasks with less L heel/Achilles pain.    Baseline Hip extension 4-/5 R, 3+/5 L, hip abduction 3-/5 R, 4-/5 L (09/04/2021); 4+/5 R, 4/5 L, hip abduction 4-/5 R, 4/5 L (L knee pain) 10/09/2021; Hip extension 4+/5 R, 4/5 L. Hip abduction in S/L  not tested secondary to L knee pain  (10/29/2021)    Time 3    Period Weeks    Status Partially Met    Target Date 01/29/22      PT LONG TERM GOAL #3   Title Pt will improve her L foot FOTO score by at least 10 points as a demonstration of improved function.    Baseline L foot FOTO 38 (09/04/2021); 43 (10/29/2021); 53 (12/02/2021)    Time 8    Period Weeks    Status Achieved    Target Date 12/25/21      PT LONG TERM GOAL #4   Title Pt will have a decrease in neck pain to 2/10 or less at worst to promote ability to look around more comfortably.    Baseline 5/10 neck pain at worst for the past 3 months (09/04/2021), (10/07/2021), 1.5/10 at most for the past 7 day (10/29/2021); 3.5/10 at most for the past 7 days when turning her head. (01/05/2022)    Time 3    Period Weeks     Status Partially Met    Target Date 01/29/22      PT LONG TERM GOAL #5   Title Patient will have a decrease in L knee pain to 2/10 or less at worst promote better mechanics for L heel during gait.    Baseline 4/10 L knee pain at most for the past 3 weeks (12/02/2021); 2/10 (01/05/2022)    Time 3    Period Weeks    Status Partially Met    Target Date 01/29/22                   Plan - 01/08/22 1620     Clinical Impression Statement Decrease neck pain with cervical rotation with treatment to decrease muscle tension and fascial restrictions R > L cervical paraspinal and upper trap areas. Pt will benefit from continued skilled physical therapy services to decrease pain, improve strength and function.    Personal Factors and Comorbidities Age;Comorbidity 3+;Time since onset of injury/illness/exacerbation;Fitness    Comorbidities Osteopenia, chronic venous insufficiency,Hx of basal cell CA S/P lumpectomy, HTN    Examination-Activity Limitations Stand;Locomotion Level;Squat;Stairs;Carry;Lift    Stability/Clinical  Decision Making Stable/Uncomplicated    Clinical Decision Making Low    Rehab Potential Fair    Clinical Impairments Affecting Rehab Potential Chronicity of condition, age, fitness level    PT Frequency 2x / week    PT Duration 4 weeks    PT Treatment/Interventions Aquatic Therapy;Electrical Stimulation;Iontophoresis 4mg /ml Dexamethasone;Gait training;Stair training;Functional mobility training;Therapeutic activities;Therapeutic exercise;Balance training;Neuromuscular re-education;Patient/family education;Manual techniques;Dry needling    PT Next Visit Plan isometric plantar flexion, glute, trunk, scapular strengthening, femoral control, manual techniques, modalities PRN    PT Home Exercise Plan no changes today    Consulted and Agree with Plan of Care Patient             Patient will benefit from skilled therapeutic intervention in order to improve the following deficits  and impairments:  Postural dysfunction, Decreased strength, Improper body mechanics, Pain, Difficulty walking  Visit Diagnosis: Cervicalgia     Problem List Patient Active Problem List   Diagnosis Date Noted   Venous ulcer of ankle, unspecified laterality ([place]) 12/24/2021   Bilateral lower extremity edema 12/17/2021   Acute respiratory infection 10/06/2021   Pre-op evaluation 06/02/2021   Right wrist pain 02/28/2021   Skin rash 02/28/2021   Ascending aorta dilatation ([facility]) 01/10/2021   Left-sided chest pain 01/10/2021   Genetic testing 10/30/2020   Malignant neoplasm of overlapping sites of left breast in female, estrogen receptor positive ([place]) 10/22/2020   Family history of breast cancer    Family history of thyroid cancer    Insertional Achilles tendinopathy 08/28/2020   Breast cancer, left breast ([place]) 08/01/2020   Right carotid bruit 06/05/2020   Chest pressure 06/03/2020   Left Achilles tendinitis 09/01/2019   Low back pain 08/29/2019   Lumbosacral spondylosis without myelopathy 08/29/2019   Calcaneal spur 08/17/2019   Contusion of knee 10/04/2018   Varicose veins of bilateral lower extremities with pain 03/30/2018   Lymphedema 03/30/2018   Dizziness 02/02/2017   Situational depression 12/18/2016   Vitamin D deficiency 10/11/2016   Fatigue 08/12/2016   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 04/14/2016   Essential hypertension 12/17/2015   Dermatitis of external ear 12/17/2015   [place] of foot 05/16/2015   Advanced care planning/counseling discussion 10/30/2014   Neck pain on right side 06/25/2014   Primary osteoarthritis of left knee 08/17/2013   Oropharyngeal dysphagia 11/15/2012   Stressful life events affecting family and household 03/30/2012   Medicare annual wellness visit, subsequent 03/30/2012   History of basal cell carcinoma    Osteoarthritis    Osteopenia    Hypothyroidism    Glaucoma    Chronic venous insufficiency    Joneen Boers PT, DPT  01/08/2022, 4:59 PM  [facility] [facility] [address] [place], [address] Phone: [phone]   Fax:  [phone]  Name: Beth Rodgers MRN: [id number] Date of Birth: 10/05/1935

## 2022-01-11 NOTE — Progress Notes (Signed)
MRN : [id number]  Beth Rodgers is a 87 y.o. (1935-03-28) female who presents with chief complaint of check legs.  History of Present Illness: The patient returns to the office for followup evaluation regarding leg swelling.  The swelling has persisted and the pain associated with swelling continues. There have not been any interval development of a ulcerations or wounds.  Since the previous visit the patient has been wearing graduated compression stockings and has noted little if any improvement in the lymphedema. The patient has been using compression routinely morning until night.  The patient also states elevation during the day and exercise is being done too.   No outpatient medications have been marked as taking for the 01/12/22 encounter (Appointment) with Delana Meyer, Dolores Lory, MD.    Past Medical History:  Diagnosis Date   Basal cell carcinoma ([place])    txted with MOHs in past   Cellulitis 08/10/2018   Chronic venous insufficiency    with varicose veins   Family history of breast cancer    Family history of thyroid cancer    GERD (gastroesophageal reflux disease)    Glaucoma    History  of basal cell carcinoma    left nare/BREAST CANCER   History of chicken pox    Hypertension    Hypoglycemia    Hypothyroid    Osteoarthritis    back pain, L knee pain   Osteopenia 06/2009   DEXA 11/2015: T -2.3 hip, -2.2 spine, on longterm bisphosphonate/evista   Psoriasis    Pyogenic granuloma 04/16/2016    Past Surgical History:  Procedure Laterality Date   BASAL CELL CARCINOMA EXCISION     located on face   BREAST BIOPSY Right    core done ing [place]?   BREAST BIOPSY Left 08/16/2020   3 area bx 4:00 X, IMC, 6:00Q IMC, LN hydro #3-benign   BREAST LUMPECTOMY Left 09/11/2020   two areas of [facility]   CATARACT EXTRACTION     bilateral   DEXA  06/2009   T score Spine: -1.8, hip -2.0   EXCISION OF BREAST BIOPSY Left 09/11/2020   Procedure: EXCISION OF BREAST BIOPSY;  Surgeon:  [name], MD;  Location: [facility] ORS;  Service: General;  Laterality: Left;   Foam sclerotherapy Bilateral 09/2015   Reed P[place], [place]   MASTECTOMY W/ SENTINEL NODE BIOPSY Left 06/25/2021   Procedure: MASTECTOMY WITH SENTINEL LYMPH NODE BIOPSY;  Surgeon: [name], MD;  Location: [facility] OR[facility];  Service: General;  Laterality: Left;   NASAL RECONSTRUCTION  2005   for [place] L nare s/p Mohs   nuclear stress test  2014   normal in [place] NODE BX Left 09/11/2020   Procedure: PARTIAL MASTECTOMY WITH NEEDLE LOCALIZATION AND AXILLARY SENTINEL LYMPH NODE [place];  Surgeon: [name], MD;  Location: [facility] ORS;  Service: General;  Laterality: Left;   RADIOFREQUENCY ABLATION Left 01/2012   remnant L G saphenous vein below knee   RADIOFREQUENCY ABLATION Right 02/2013   RLE vein ablation    VEIN LIGATION AND STRIPPING  remote   bilateral G saphenous veins    Social History Social History   Tobacco Use   Smoking status: Never   Smokeless tobacco: Never  Vaping Use   Vaping Use: Never used  Substance Use Topics   Alcohol use: Yes    Comment: Occasionally drinks wine   Drug use: No    Family History Family History  Problem  Relation Age of Onset   Cancer Mother        thyroid cancer   Heart disease Father        CHF   Diabetes Father    Glaucoma Father    Arthritis Father    Coronary artery disease Maternal Uncle    Bipolar disorder Son    Heart disease Sister        CHF   Stroke Sister    Brain cancer Grandson        astrocytoma   Breast cancer Cousin        dx 82s    Allergies  Allergen Reactions   Anastrozole Other (See Comments)    Back pain    Exemestane     Pain in right hand    Naproxen Hives    Had bumps break out on the back      REVIEW OF SYSTEMS (Negative unless checked)  Constitutional: [] Weight loss  [] Fever  [] Chills Cardiac: [] Chest pain   [] Chest  pressure   [] Palpitations   [] Shortness of breath when laying flat   [] Shortness of breath with exertion. Vascular:  [] Pain in legs with walking   [x] Pain in legs at rest  [] History of DVT   [] Phlebitis   [x] Swelling in legs   [] Varicose veins   [] Non-healing ulcers Pulmonary:   [] Uses home oxygen   [] Productive cough   [] Hemoptysis   [] Wheeze  [] COPD   [] Asthma Neurologic:  [] Dizziness   [] Seizures   [] History of stroke   [] History of TIA  [] Aphasia   [] Vissual changes   [] Weakness or numbness in arm   [] Weakness or numbness in leg Musculoskeletal:   [] Joint swelling   [] Joint pain   [] Low back pain Hematologic:  [] Easy bruising  [] Easy bleeding   [] Hypercoagulable state   [] Anemic Gastrointestinal:  [] Diarrhea   [] Vomiting  [] Gastroesophageal reflux/heartburn   [] Difficulty swallowing. Genitourinary:  [] Chronic kidney disease   [] Difficult urination  [] Frequent urination   [] Blood in urine Skin:  [] Rashes   [] Ulcers  Psychological:  [] History of anxiety   []  History of major depression.  Physical Examination  There were no vitals filed for this visit. There is no height or weight on file to calculate BMI. Gen: WD/WN, NAD Head: [place]/AT, No temporalis wasting.  Ear/Nose/Throat: Hearing grossly intact, nares w/o erythema or drainage, pinna without lesions Eyes: PER, EOMI, sclera nonicteric.  Neck: Supple, no gross masses.  No JVD.  Pulmonary:  Good air movement, no audible wheezing, no use of accessory muscles.  Cardiac: RRR, precordium not hyperdynamic. Vascular:  scattered varicosities present bilaterally.  Moderate venous stasis changes to the legs bilaterally.  3+ soft pitting edema  Vessel Right Left  Radial Palpable Palpable  Gastrointestinal: soft, non-distended. No guarding/no peritoneal signs.  Musculoskeletal: M/S 5/5 throughout.  No deformity.  Neurologic: CN 2-12 intact. Pain and light touch intact in extremities.  Symmetrical.  Speech is fluent. Motor exam as listed  above. Psychiatric: Judgment intact, Mood & affect appropriate for pt's clinical situation. Dermatologic: Moderate venous rashes no ulcers noted.  No changes consistent with cellulitis. Lymph : No lichenification or skin changes of chronic lymphedema.  CBC Lab Results  Component Value Date   WBC 6.3 06/02/2021   HGB 13.9 06/02/2021   HCT 40.9 06/02/2021   MCV 85.7 06/02/2021   PLT 230.0 06/02/2021    BMET    Component Value Date/Time   NA 139 06/02/2021 1227   K 4.3 06/02/2021 1227   CL  102 06/02/2021 1227   CO2 27 06/02/2021 1227   GLUCOSE 82 06/02/2021 1227   BUN 19 06/02/2021 1227   CREATININE 0.72 06/02/2021 1227   CALCIUM 9.8 06/02/2021 1227   GFRNONAA >60 01/04/2021 0814   GFRAA >60 08/21/2020 1625   CrCl cannot be calculated (Patient's most recent lab result is older than the maximum 21 days allowed.).  COAG No results found for: INR, PROTIME  Radiology No results found.   Assessment/Plan 1. Chronic venous insufficiency  No surgery or intervention at this point in time.    I have reviewed my discussion with the patient regarding lymphedema and why it  causes symptoms.  Patient will continue wearing graduated compression stockings class 1 (20-30 mmHg) on a daily basis a prescription was given. The patient is reminded to put the stockings on first thing in the morning and removing them in the evening. The patient is instructed specifically not to sleep in the stockings.   In addition, behavioral modification throughout the day will be continued.  This will include frequent elevation (such as in a recliner), use of over the counter pain medications as needed and exercise such as walking.  I have reviewed systemic causes for chronic edema such as liver, kidney and cardiac etiologies and there does not appear to be any significant changes in these organ systems over the past year.  The patient is under the impression that these organ systems are all stable and  unchanged.    The patient will continue aggressive use of the  lymph pump.  This will continue to improve the edema control and prevent sequela such as ulcers and infections.   The patient will follow-up with me on an annual basis.     A total of 35 minutes was spent with this patient and greater than 50% was spent in counseling and coordination of care with the patient.  Discussion included the treatment options for vascular disease including indications for surgery and intervention.  Also discussed is the appropriate timing of treatment.  In addition medical therapy was discussed.   - VAS [place] LOWER EXTREMITY VENOUS (DVT); Future  2. Lymphedema  No surgery or intervention at this point in time.    I have reviewed my discussion with the patient regarding lymphedema and why it  causes symptoms.  Patient will continue wearing graduated compression stockings class 1 (20-30 mmHg) on a daily basis a prescription was given. The patient is reminded to put the stockings on first thing in the morning and removing them in the evening. The patient is instructed specifically not to sleep in the stockings.   In addition, behavioral modification throughout the day will be continued.  This will include frequent elevation (such as in a recliner), use of over the counter pain medications as needed and exercise such as walking.  I have reviewed systemic causes for chronic edema such as liver, kidney and cardiac etiologies and there does not appear to be any significant changes in these organ systems over the past year.  The patient is under the impression that these organ systems are all stable and unchanged.    The patient will continue aggressive use of the  lymph pump.  This will continue to improve the edema control and prevent sequela such as ulcers and infections.   The patient will follow-up with me on an annual basis.    - VAS [place] LOWER EXTREMITY VENOUS (DVT); Future  3. Essential hypertension Continue  antihypertensive medications as already ordered,  these medications have been reviewed and there are no changes at this time.   4. Primary osteoarthritis involving multiple joints Continue NSAID medications as already ordered, these medications have been reviewed and there are no changes at this time.  Continued activity and therapy was stressed.    Hortencia Pilar, MD  01/11/2022 1:26 PM

## 2022-01-12 ENCOUNTER — Encounter (INDEPENDENT_AMBULATORY_CARE_PROVIDER_SITE_OTHER): Payer: Self-pay | Admitting: Vascular Surgery

## 2022-01-12 ENCOUNTER — Ambulatory Visit (INDEPENDENT_AMBULATORY_CARE_PROVIDER_SITE_OTHER): Payer: Medicare Other | Admitting: Vascular Surgery

## 2022-01-12 ENCOUNTER — Other Ambulatory Visit: Payer: Self-pay

## 2022-01-12 VITALS — BP 144/79 | HR 78 | Resp 16 | Wt 169.6 lb

## 2022-01-12 DIAGNOSIS — I10 Essential (primary) hypertension: Secondary | ICD-10-CM | POA: Diagnosis not present

## 2022-01-12 DIAGNOSIS — M159 Polyosteoarthritis, unspecified: Secondary | ICD-10-CM

## 2022-01-12 DIAGNOSIS — I890 Lymphedema, not elsewhere classified: Secondary | ICD-10-CM | POA: Diagnosis not present

## 2022-01-12 DIAGNOSIS — I872 Venous insufficiency (chronic) (peripheral): Secondary | ICD-10-CM | POA: Diagnosis not present

## 2022-01-13 ENCOUNTER — Ambulatory Visit: Payer: Medicare Other

## 2022-01-13 DIAGNOSIS — R262 Difficulty in walking, not elsewhere classified: Secondary | ICD-10-CM

## 2022-01-13 DIAGNOSIS — M542 Cervicalgia: Secondary | ICD-10-CM

## 2022-01-13 DIAGNOSIS — M25562 Pain in left knee: Secondary | ICD-10-CM | POA: Diagnosis not present

## 2022-01-13 DIAGNOSIS — M79672 Pain in left foot: Secondary | ICD-10-CM | POA: Diagnosis not present

## 2022-01-13 DIAGNOSIS — I890 Lymphedema, not elsewhere classified: Secondary | ICD-10-CM | POA: Diagnosis not present

## 2022-01-13 DIAGNOSIS — G8929 Other chronic pain: Secondary | ICD-10-CM

## 2022-01-13 DIAGNOSIS — M1712 Unilateral primary osteoarthritis, left knee: Secondary | ICD-10-CM | POA: Diagnosis not present

## 2022-01-13 NOTE — Therapy (Signed)
[facility] [address], [place], [address] Phone: [phone]   Fax:  [phone]  Physical Therapy Treatment  Patient Details  Name: Beth Rodgers MRN: [id number] Date of Birth: 1935/07/06 Referring Provider (PT): Owens Loffler, MD   Encounter Date: 01/13/2022   PT End of Session - 01/13/22 1421     Visit Number 26    Number of Visits 30    Date for PT Re-Evaluation 01/29/22    Authorization Type Medicare    Progress Note Due on Visit 72    PT Start Time 1421    PT Stop Time 1501    PT Time Calculation (min) 40 min    Activity Tolerance Patient tolerated treatment well    Behavior During Therapy [name] for tasks assessed/performed             Past Medical History:  Diagnosis Date   Basal cell carcinoma ([name])    txted with MOHs in past   Cellulitis 08/10/2018   Chronic venous insufficiency    with varicose veins   Family history of breast cancer    Family history of thyroid cancer    GERD (gastroesophageal reflux disease)    Glaucoma    History  of basal cell carcinoma    left nare/BREAST CANCER   History of chicken pox    Hypertension    Hypoglycemia    Hypothyroid    Osteoarthritis    back pain, L knee pain   Osteopenia 06/2009   DEXA 11/2015: T -2.3 hip, -2.2 spine, on longterm bisphosphonate/evista   Psoriasis    Pyogenic granuloma 04/16/2016    Past Surgical History:  Procedure Laterality Date   BASAL CELL CARCINOMA EXCISION     located on face   BREAST BIOPSY Right    core done ing [place]?   BREAST BIOPSY Left 08/16/2020   3 area bx 4:00 X, IMC, 6:00Q IMC, LN hydro #3-benign   BREAST LUMPECTOMY Left 09/11/2020   two areas of [facility]   CATARACT EXTRACTION     bilateral   DEXA  06/2009   T score Spine: -1.8, hip -2.0   EXCISION OF BREAST BIOPSY Left 09/11/2020   Procedure: EXCISION OF BREAST BIOPSY;  Surgeon: [name], MD;  Location: [facility] ORS;  Service:  General;  Laterality: Left;   Foam sclerotherapy Bilateral 09/2015   [place], [place]   MASTECTOMY W/ SENTINEL NODE BIOPSY Left 06/25/2021   Procedure: MASTECTOMY WITH SENTINEL LYMPH NODE BIOPSY;  Surgeon: [name], MD;  Location: [facility] OR[facility];  Service: General;  Laterality: Left;   NASAL RECONSTRUCTION  2005   for [place] L nare s/p Mohs   nuclear stress test  2014   normal in [place] BX Left 09/11/2020   Procedure: PARTIAL MASTECTOMY WITH NEEDLE LOCALIZATION AND AXILLARY SENTINEL LYMPH NODE [place];  Surgeon: [name], MD;  Location: [facility];  Service: General;  Laterality: Left;   RADIOFREQUENCY ABLATION Left 01/2012   remnant L G saphenous vein below knee   RADIOFREQUENCY ABLATION Right 02/2013   RLE vein ablation    VEIN LIGATION AND STRIPPING  remote   bilateral G saphenous veins    There were no vitals filed for this visit.   Subjective Assessment - 01/13/22 1423     Subjective Met with Dr. [name]. Might hold off with the operation and do the injections to see how her knee  will do. Also has not had any pain in her L heel. L knee is also alowing her to walk more secure. 2/10 L knee pain currently. Slight discomfort L heel. Neck is doing better.    Pertinent History Neck and achilles pain. L knee also bothers her. Had injection L knee which helped 10 days before her breast surgery June 25, 2021. Dr. [name] also states Pt needs surgery for her L knee (TKA). Pt however has a heel spur on L foot which is chronic. Went to 3 specialists for heel spur and was told that there is nothing can be done surgically. The heel pain however affects her gait which affects her L knee increasing L knee pain. Pt continues to do her HEP from previous PT. Neck pain began about 6 months ago gradually. Started feeling tightness from R medial shoulder blade to neck. Pt is R hand dominant. L foot bothers her  more currently.    Currently in Pain? Yes    Pain Score 2     Pain Location Knee    Pain Orientation Left                                        PT Education - 01/13/22 1733     Education Details ther-ex    Person(s) Educated Patient    Methods Explanation;Demonstration;Tactile cues;Verbal cues    Comprehension Returned demonstration;Verbalized understanding           Objective     No latex band allergies   B varicose veins legs Osteopenia     L heel pain Hx of L mastectomy on June 25, 2021 and had home health PT for recovery.      Started using a SPC 5 months ago when ambulating by herself. Furniture walk at home.    Med[employer] Access Code [email or web address]     Manual therapy   Seated STM L lateral hamstrings to decrease tension and improve L knee and ankle mechanics.    Supine medial glide L leg grade 1 for pain control of L knee to promote better L foot and heel mechanics during gait.      Therapeutic exercise  Hooklying hip extension leg straight to promote glute max strength             L 10x3 with 5 second holds    Hooklying manually reisted hip abduction isometrics, leg straight              L 10x2 with 5 second holds, then 6x5 seconds.   Seated B scapular retraction 10x5 seconds for 3 sets to promote thoracic extension and decrease stress to neck.   Seated chin tuck 10x5 seconds   R and L cervical rotation with cues to decrease lower cervical extension compensation 10x        Improved exercise technique, movement at target joints, use of target muscles after min to mod verbal, visual, tactile cues.        Response to treatment Pt tolerated session well without aggravation of symptoms.         Clinical Impression Pt continues to do well with decreased L knee and heel pain based on subjective reports. Continued working on improving hip strengthening and decreasing lateral hamstrings muscle tension to promote better  mechanics to her L knee. Also worked on thoracic extension to decrease stress to neck. Pt tolerated session well without  aggravation of symptoms. Pt will benefit from continued skilled physical therapy services to decrease pain, improve strength and function.         PT Short Term Goals - 10/09/21 1422       PT SHORT TERM GOAL #1   Title Patient will be independent with her initial HEP to improve strength, decrease neck and L heel pain, improve function.    Baseline Does her HEP, no questions (10/09/2021)    Time 3    Period Weeks    Status Achieved    Target Date 09/25/21               PT Long Term Goals - 01/05/22 1341       PT LONG TERM GOAL #1   Title Pt will have a decrease in L heel pain to 4/10 or less at worst to promote ability to ambulate, perform standing tasks more comfortably.    Baseline 8/10 L heel/Achilles pain at worst for the past 3 months (09/04/2021);  5/10 at worst (10/07/2021); 4/10 at most for the past 7 days, most of the time, 2/10, worst is getting up in the middle of the night to go to the bathroom (10/29/2021), 2/10 at most for the pst 7 days (12/02/2021); 1/10 at most for the past 7 days (01/05/2022)    Time 8    Period Weeks    Status Achieved    Target Date 10/30/21      PT LONG TERM GOAL #2   Title Pt will improve bilateral hip extension and abduction strength by at least 1/2 MMT grade to promote femoral control and ability to ambulate and perform closed chain tasks with less L heel/Achilles pain.    Baseline Hip extension 4-/5 R, 3+/5 L, hip abduction 3-/5 R, 4-/5 L (09/04/2021); 4+/5 R, 4/5 L, hip abduction 4-/5 R, 4/5 L (L knee pain) 10/09/2021; Hip extension 4+/5 R, 4/5 L. Hip abduction in S/L  not tested secondary to L knee pain  (10/29/2021)    Time 3    Period Weeks    Status Partially Met    Target Date 01/29/22      PT LONG TERM GOAL #3   Title Pt will improve her L foot FOTO score by at least 10 points as a demonstration of improved  function.    Baseline L foot FOTO 38 (09/04/2021); 43 (10/29/2021); 53 (12/02/2021)    Time 8    Period Weeks    Status Achieved    Target Date 12/25/21      PT LONG TERM GOAL #4   Title Pt will have a decrease in neck pain to 2/10 or less at worst to promote ability to look around more comfortably.    Baseline 5/10 neck pain at worst for the past 3 months (09/04/2021), (10/07/2021), 1.5/10 at most for the past 7 day (10/29/2021); 3.5/10 at most for the past 7 days when turning her head. (01/05/2022)    Time 3    Period Weeks    Status Partially Met    Target Date 01/29/22      PT LONG TERM GOAL #5   Title Patient will have a decrease in L knee pain to 2/10 or less at worst promote better mechanics for L heel during gait.    Baseline 4/10 L knee pain at most for the past 3 weeks (12/02/2021); 2/10 (01/05/2022)    Time 3    Period Weeks    Status Partially Met  Target Date 01/29/22                   Plan - 01/13/22 1733     Clinical Impression Statement Pt continues to do well with decreased L knee and heel pain based on subjective reports. Continued working on improving hip strengthening and decreasing lateral hamstrings muscle tension to promote better mechanics to her L knee. Also worked on thoracic extension to decrease stress to neck. Pt tolerated session well without aggravation of symptoms. Pt will benefit from continued skilled physical therapy services to decrease pain, improve strength and function.    Personal Factors and Comorbidities Age;Comorbidity 3+;Time since onset of injury/illness/exacerbation;Fitness    Comorbidities Osteopenia, chronic venous insufficiency,Hx of basal cell CA S/P lumpectomy, HTN    Examination-Activity Limitations Stand;Locomotion Level;Squat;Stairs;Carry;Lift    Stability/Clinical Decision Making Stable/Uncomplicated    Rehab Potential Fair    Clinical Impairments Affecting Rehab Potential Chronicity of condition, age, fitness level    PT  Frequency 2x / week    PT Duration 4 weeks    PT Treatment/Interventions Aquatic Therapy;Electrical Stimulation;Iontophoresis 53m/ml Dexamethasone;Gait training;Stair training;Functional mobility training;Therapeutic activities;Therapeutic exercise;Balance training;Neuromuscular re-education;Patient/family education;Manual techniques;Dry needling    PT Next Visit Plan isometric plantar flexion, glute, trunk, scapular strengthening, femoral control, manual techniques, modalities PRN    PT Home Exercise Plan no changes today    Consulted and Agree with Plan of Care Patient             Patient will benefit from skilled therapeutic intervention in order to improve the following deficits and impairments:  Postural dysfunction, Decreased strength, Improper body mechanics, Pain, Difficulty walking  Visit Diagnosis: Chronic pain of left knee  Cervicalgia  Difficulty in walking, not elsewhere classified     Problem List Patient Active Problem List   Diagnosis Date Noted   Venous ulcer of ankle, unspecified laterality ([facility] 12/24/2021   Bilateral lower extremity edema 12/17/2021   Acute respiratory infection 10/06/2021   Pre-op evaluation 06/02/2021   Right wrist pain 02/28/2021   Skin rash 02/28/2021   Ascending aorta dilatation (HCC) 01/10/2021   Left-sided chest pain 01/10/2021   Genetic testing 10/30/2020   Malignant neoplasm of overlapping sites of left breast in female, estrogen receptor positive ([facility] 10/22/2020   Family history of breast cancer    Family history of thyroid cancer    Insertional Achilles tendinopathy 08/28/2020   Breast cancer, left breast ([place] 08/01/2020   Right carotid bruit 06/05/2020   Chest pressure 06/03/2020   Left Achilles tendinitis 09/01/2019   Low back pain 08/29/2019   Lumbosacral spondylosis without myelopathy 08/29/2019   Calcaneal spur 08/17/2019   Contusion of knee 10/04/2018   Varicose veins of bilateral lower extremities with pain  03/30/2018   Lymphedema 03/30/2018   Dizziness 02/02/2017   Situational depression 12/18/2016   Vitamin D deficiency 10/11/2016   Fatigue 08/12/2016   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 04/14/2016   Essential hypertension 12/17/2015   Dermatitis of external ear 12/17/2015   Corn of foot 05/16/2015   Advanced care planning/counseling discussion 10/30/2014   Neck pain on right side 06/25/2014   Primary osteoarthritis of left knee 08/17/2013   Oropharyngeal dysphagia 11/15/2012   Stressful life events affecting family and household 03/30/2012   Medicare annual wellness visit, subsequent 03/30/2012   History of basal cell carcinoma    Osteoarthritis    Osteopenia    Hypothyroidism    Glaucoma    Chronic venous insufficiency  Joneen Boers PT, DPT   01/13/2022, 5:37 PM  [facility] [address], [place], [address] Phone: [phone]   Fax:  [phone]  Name: LAMYIA CDEBACA MRN: [id number] Date of Birth: 03-Oct-1935

## 2022-01-15 ENCOUNTER — Ambulatory Visit: Payer: Medicare Other

## 2022-01-15 DIAGNOSIS — R262 Difficulty in walking, not elsewhere classified: Secondary | ICD-10-CM | POA: Diagnosis not present

## 2022-01-15 DIAGNOSIS — G8929 Other chronic pain: Secondary | ICD-10-CM | POA: Diagnosis not present

## 2022-01-15 DIAGNOSIS — M25562 Pain in left knee: Secondary | ICD-10-CM

## 2022-01-15 DIAGNOSIS — M79672 Pain in left foot: Secondary | ICD-10-CM | POA: Diagnosis not present

## 2022-01-15 DIAGNOSIS — M542 Cervicalgia: Secondary | ICD-10-CM | POA: Diagnosis not present

## 2022-01-15 DIAGNOSIS — I890 Lymphedema, not elsewhere classified: Secondary | ICD-10-CM | POA: Diagnosis not present

## 2022-01-15 NOTE — Therapy (Signed)
[facility] [address], [place], [address] Phone: [phone]   Fax:  [phone]  Physical Therapy Treatment  Patient Details  Name: Beth Rodgers MRN: [id number] Date of Birth: 07/13/1935 Referring Provider (PT): Owens Loffler, MD   Encounter Date: 01/15/2022   PT End of Session - 01/15/22 1417     Visit Number 27    Number of Visits 28    Date for PT Re-Evaluation 01/29/22    Authorization Type Medicare    Progress Note Due on Visit 20    PT Start Time 1417    PT Stop Time 1502    PT Time Calculation (min) 45 min    Activity Tolerance Patient tolerated treatment well    Behavior During Therapy [facility] for tasks assessed/performed             Past Medical History:  Diagnosis Date   Basal cell carcinoma ([place])    txted with MOHs in past   Cellulitis 08/10/2018   Chronic venous insufficiency    with varicose veins   Family history of breast cancer    Family history of thyroid cancer    GERD (gastroesophageal reflux disease)    Glaucoma    History  of basal cell carcinoma    left nare/BREAST CANCER   History of chicken pox    Hypertension    Hypoglycemia    Hypothyroid    Osteoarthritis    back pain, L knee pain   Osteopenia 06/2009   DEXA 11/2015: T -2.3 hip, -2.2 spine, on longterm bisphosphonate/evista   Psoriasis    Pyogenic granuloma 04/16/2016    Past Surgical History:  Procedure Laterality Date   BASAL CELL CARCINOMA EXCISION     located on face   BREAST BIOPSY Right    core done ing [place]?   BREAST BIOPSY Left 08/16/2020   3 area bx 4:00 X, IMC, 6:00Q IMC, LN hydro #3-benign   BREAST LUMPECTOMY Left 09/11/2020   two areas of [facility]   CATARACT EXTRACTION     bilateral   DEXA  06/2009   T score Spine: -1.8, hip -2.0   EXCISION OF BREAST BIOPSY Left 09/11/2020   Procedure: EXCISION OF BREAST BIOPSY;  Surgeon: [name], MD;  Location: [facility] ORS;  Service:  General;  Laterality: Left;   Foam sclerotherapy Bilateral 09/2015   [place], [place]   MASTECTOMY W/ SENTINEL NODE BIOPSY Left 06/25/2021   Procedure: MASTECTOMY WITH SENTINEL LYMPH NODE BIOPSY;  Surgeon: [name], MD;  Location: [facility] ORS;  Service: General;  Laterality: Left;   NASAL RECONSTRUCTION  2005   for [place] L nare s/p Mohs   nuclear stress test  2014   normal in [place] BX Left 09/11/2020   Procedure: PARTIAL MASTECTOMY WITH NEEDLE LOCALIZATION AND AXILLARY SENTINEL LYMPH NODE [place];  Surgeon: [name], MD;  Location: [facility] ORS;  Service: General;  Laterality: Left;   RADIOFREQUENCY ABLATION Left 01/2012   remnant L G saphenous vein below knee   RADIOFREQUENCY ABLATION Right 02/2013   RLE vein ablation    VEIN LIGATION AND STRIPPING  remote   bilateral G saphenous veins    There were no vitals filed for this visit.   Subjective Assessment - 01/15/22 1418     Subjective L knee and heel is a little painful because she has been doing a lot. Getting her injection to  her L knee tomorrow, Friday. 1/10 currently L knee and heel. Neck is 1.5/10.  Pt also states that she is walking better.    Pertinent History Neck and achilles pain. L knee also bothers her. Had injection L knee which helped 10 days before her breast surgery June 25, 2021. Dr. [name] also states Pt needs surgery for her L knee (TKA). Pt however has a heel spur on L foot which is chronic. Went to 3 specialists for heel spur and was told that there is nothing can be done surgically. The heel pain however affects her gait which affects her L knee increasing L knee pain. Pt continues to do her HEP from previous PT. Neck pain began about 6 months ago gradually. Started feeling tightness from R medial shoulder blade to neck. Pt is R hand dominant. L foot bothers her more currently.    Currently in Pain? Yes    Pain Score  1                                         PT Education - 01/15/22 1700     Education Details ther-ex    Person(s) Educated Patient    Methods Explanation;Demonstration;Tactile cues;Verbal cues    Comprehension Returned demonstration;Verbalized understanding               Objective     No latex band allergies   B varicose veins legs Osteopenia     L heel pain Hx of L mastectomy on June 25, 2021 and had home health PT for recovery.      Started using a SPC 5 months ago when ambulating by herself. Furniture walk at home.    Medbridge Access Code [email or web address]     Manual therapy   Seated STM R upper trap and cervical paraspinal area to decrease muscle tension   Seated STM L lateral hamstrings to decrease tension and improve L knee and ankle mechanics.    Supine medial glide L leg grade 1 for pain control of L knee to promote better L foot and heel mechanics during gait.        Therapeutic exercise   Seated manually resisted scapular retraction targeting the lower trap muscle  R 10x5 seconds for 3 sets to decrease R upper trap muscle tension   [place] manually reisted hip abduction isometrics, leg straight              L 10x3 with 5 second holds  [place] hip extension leg straight to promote glute max strength             L 10x2 with 5 second holds then 6x5 seconds     Seated manually resisted eccentric PF L 10x      Improved exercise technique, movement at target joints, use of target muscles after min to mod verbal, visual, tactile cues.        Response to treatment Pt tolerated session well without aggravation of symptoms. Decreased neck, L knee and L heel pain reported after session.         Clinical Impression Decreased neck pain with treatment to decrease R cervical paraspinal and R upper trap muscle tension. Continued working on decreasing L lateral hamstrings muscle tension as well as improving glute med and max  strength to promote better mechanics to her L knee and Achilles during gait. Pt tolerated session well without aggravation  of symptoms. Pt will benefit from continued skilled physical therapy services to decrease pain, improve strength and function.         PT Short Term Goals - 10/09/21 1422       PT SHORT TERM GOAL #1   Title Patient will be independent with her initial HEP to improve strength, decrease neck and L heel pain, improve function.    Baseline Does her HEP, no questions (10/09/2021)    Time 3    Period Weeks    Status Achieved    Target Date 09/25/21               PT Long Term Goals - 01/05/22 1341       PT LONG TERM GOAL #1   Title Pt will have a decrease in L heel pain to 4/10 or less at worst to promote ability to ambulate, perform standing tasks more comfortably.    Baseline 8/10 L heel/Achilles pain at worst for the past 3 months (09/04/2021);  5/10 at worst (10/07/2021); 4/10 at most for the past 7 days, most of the time, 2/10, worst is getting up in the middle of the night to go to the bathroom (10/29/2021), 2/10 at most for the pst 7 days (12/02/2021); 1/10 at most for the past 7 days (01/05/2022)    Time 8    Period Weeks    Status Achieved    Target Date 10/30/21      PT LONG TERM GOAL #2   Title Pt will improve bilateral hip extension and abduction strength by at least 1/2 MMT grade to promote femoral control and ability to ambulate and perform closed chain tasks with less L heel/Achilles pain.    Baseline Hip extension 4-/5 R, 3+/5 L, hip abduction 3-/5 R, 4-/5 L (09/04/2021); 4+/5 R, 4/5 L, hip abduction 4-/5 R, 4/5 L (L knee pain) 10/09/2021; Hip extension 4+/5 R, 4/5 L. Hip abduction in S/L  not tested secondary to L knee pain  (10/29/2021)    Time 3    Period Weeks    Status Partially Met    Target Date 01/29/22      PT LONG TERM GOAL #3   Title Pt will improve her L foot FOTO score by at least 10 points as a demonstration of improved function.     Baseline L foot FOTO 38 (09/04/2021); 43 (10/29/2021); 53 (12/02/2021)    Time 8    Period Weeks    Status Achieved    Target Date 12/25/21      PT LONG TERM GOAL #4   Title Pt will have a decrease in neck pain to 2/10 or less at worst to promote ability to look around more comfortably.    Baseline 5/10 neck pain at worst for the past 3 months (09/04/2021), (10/07/2021), 1.5/10 at most for the past 7 day (10/29/2021); 3.5/10 at most for the past 7 days when turning her head. (01/05/2022)    Time 3    Period Weeks    Status Partially Met    Target Date 01/29/22      PT LONG TERM GOAL #5   Title Patient will have a decrease in L knee pain to 2/10 or less at worst promote better mechanics for L heel during gait.    Baseline 4/10 L knee pain at most for the past 3 weeks (12/02/2021); 2/10 (01/05/2022)    Time 3    Period Weeks    Status Partially Met  Target Date 01/29/22                   Plan - 01/15/22 1417     Clinical Impression Statement Decreased neck pain with treatment to decrease R cervical paraspinal and R upper trap muscle tension. Continued working on decreasing L lateral hamstrings muscle tension as well as improving glute med and max strength to promote better mechanics to her L knee and Achilles during gait. Pt tolerated session well without aggravation of symptoms. Pt will benefit from continued skilled physical therapy services to decrease pain, improve strength and function.    Personal Factors and Comorbidities Age;Comorbidity 3+;Time since onset of injury/illness/exacerbation;Fitness    Comorbidities Osteopenia, chronic venous insufficiency,Hx of basal cell CA S/P lumpectomy, HTN    Examination-Activity Limitations Stand;Locomotion Level;Squat;Stairs;Carry;Lift    Stability/Clinical Decision Making Stable/Uncomplicated    Rehab Potential Fair    Clinical Impairments Affecting Rehab Potential Chronicity of condition, age, fitness level    PT Frequency 2x / week    PT  Duration 4 weeks    PT Treatment/Interventions Aquatic Therapy;Electrical Stimulation;Iontophoresis 4mg /ml Dexamethasone;Gait training;Stair training;Functional mobility training;Therapeutic activities;Therapeutic exercise;Balance training;Neuromuscular re-education;Patient/family education;Manual techniques;Dry needling    PT Next Visit Plan isometric plantar flexion, glute, trunk, scapular strengthening, femoral control, manual techniques, modalities PRN    PT Home Exercise Plan no changes today    Consulted and Agree with Plan of Care Patient             Patient will benefit from skilled therapeutic intervention in order to improve the following deficits and impairments:  Postural dysfunction, Decreased strength, Improper body mechanics, Pain, Difficulty walking  Visit Diagnosis: Cervicalgia  Difficulty in walking, not elsewhere classified  Chronic pain of left knee     Problem List Patient Active Problem List   Diagnosis Date Noted   Venous ulcer of ankle, unspecified laterality ([place]) 12/24/2021   Bilateral lower extremity edema 12/17/2021   Acute respiratory infection 10/06/2021   Pre-op evaluation 06/02/2021   Right wrist pain 02/28/2021   Skin rash 02/28/2021   Ascending aorta dilatation ([facility]) 01/10/2021   Left-sided chest pain 01/10/2021   Genetic testing 10/30/2020   Malignant neoplasm of overlapping sites of left breast in female, estrogen receptor positive ([place]) 10/22/2020   Family history of breast cancer    Family history of thyroid cancer    Insertional Achilles tendinopathy 08/28/2020   Breast cancer, left breast ([place]) 08/01/2020   Right carotid bruit 06/05/2020   Chest pressure 06/03/2020   Left Achilles tendinitis 09/01/2019   Low back pain 08/29/2019   Lumbosacral spondylosis without myelopathy 08/29/2019   Calcaneal spur 08/17/2019   Contusion of knee 10/04/2018   Varicose veins of bilateral lower extremities with pain 03/30/2018   Lymphedema  03/30/2018   Dizziness 02/02/2017   Situational depression 12/18/2016   Vitamin D deficiency 10/11/2016   Fatigue 08/12/2016   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 04/14/2016   Essential hypertension 12/17/2015   Dermatitis of external ear 12/17/2015   Corn of foot 05/16/2015   Advanced care planning/counseling discussion 10/30/2014   Neck pain on right side 06/25/2014   Primary osteoarthritis of left knee 08/17/2013   Oropharyngeal dysphagia 11/15/2012   Stressful life events affecting family and household 03/30/2012   Medicare annual wellness visit, subsequent 03/30/2012   History of basal cell carcinoma    Osteoarthritis    Osteopenia    Hypothyroidism    Glaucoma    Chronic venous insufficiency  Joneen Boers PT, DPT  01/15/2022, 5:07 PM  [facility] [address], [place], [address] Phone: [phone]   Fax:  [phone]  Name: JAYMES REVELS MRN: [id number] Date of Birth: 1935/08/14

## 2022-01-16 ENCOUNTER — Telehealth (INDEPENDENT_AMBULATORY_CARE_PROVIDER_SITE_OTHER): Payer: Self-pay

## 2022-01-16 NOTE — Telephone Encounter (Signed)
She should reach out to her PCP as this may not be related to her lymphedema.  She can see GS with next available no studies

## 2022-01-18 ENCOUNTER — Encounter (INDEPENDENT_AMBULATORY_CARE_PROVIDER_SITE_OTHER): Payer: Self-pay | Admitting: Vascular Surgery

## 2022-01-19 NOTE — Telephone Encounter (Signed)
Patient was made aware with medical advice and verbalized understanding. Patient informed that the symptoms have improved some with elevation,using lymph pump,exercises,and using her ointment. Patient will contact the office if symptoms become worst over time.

## 2022-01-20 ENCOUNTER — Ambulatory Visit (INDEPENDENT_AMBULATORY_CARE_PROVIDER_SITE_OTHER): Payer: Medicare Other | Admitting: Psychology

## 2022-01-20 ENCOUNTER — Ambulatory Visit: Payer: Medicare Other

## 2022-01-20 ENCOUNTER — Other Ambulatory Visit: Payer: Self-pay

## 2022-01-20 DIAGNOSIS — M25562 Pain in left knee: Secondary | ICD-10-CM | POA: Diagnosis not present

## 2022-01-20 DIAGNOSIS — G8929 Other chronic pain: Secondary | ICD-10-CM | POA: Diagnosis not present

## 2022-01-20 DIAGNOSIS — I890 Lymphedema, not elsewhere classified: Secondary | ICD-10-CM | POA: Diagnosis not present

## 2022-01-20 DIAGNOSIS — R262 Difficulty in walking, not elsewhere classified: Secondary | ICD-10-CM | POA: Diagnosis not present

## 2022-01-20 DIAGNOSIS — M542 Cervicalgia: Secondary | ICD-10-CM

## 2022-01-20 DIAGNOSIS — M79672 Pain in left foot: Secondary | ICD-10-CM

## 2022-01-20 DIAGNOSIS — F4323 Adjustment disorder with mixed anxiety and depressed mood: Secondary | ICD-10-CM | POA: Diagnosis not present

## 2022-01-20 NOTE — Therapy (Signed)
[facility] [address], [place], [address] Phone: [phone]   Fax:  [phone]  Physical Therapy Treatment  Patient Details  Name: Beth Rodgers MRN: [id number] Date of Birth: [birth year] Referring Provider (PT): Owens Loffler, MD   Encounter Date: 01/20/2022   PT End of Session - 01/20/22 1431     Visit Number 28    Number of Visits 23    Date for PT Re-Evaluation 01/29/22    Authorization Type Medicare    Progress Note Due on Visit 40    PT Start Time 1417    PT Stop Time 1500    PT Time Calculation (min) 43 min    Activity Tolerance Patient tolerated treatment well    Behavior During Therapy [facility] for tasks assessed/performed             Past Medical History:  Diagnosis Date   Basal cell carcinoma ([place])    txted with MOHs in past   Cellulitis 08/10/2018   Chronic venous insufficiency    with varicose veins   Family history of breast cancer    Family history of thyroid cancer    GERD (gastroesophageal reflux disease)    Glaucoma    History  of basal cell carcinoma    left nare/BREAST CANCER   History of chicken pox    Hypertension    Hypoglycemia    Hypothyroid    Osteoarthritis    back pain, L knee pain   Osteopenia 06/2009   DEXA 11/2015: T -2.3 hip, -2.2 spine, on longterm bisphosphonate/evista   Psoriasis    Pyogenic granuloma 04/16/2016    Past Surgical History:  Procedure Laterality Date   BASAL CELL CARCINOMA EXCISION     located on face   BREAST BIOPSY Right    core done ing [place]?   BREAST BIOPSY Left 08/16/2020   3 area bx 4:00 X, IMC, 6:00Q IMC, LN hydro #3-benign   BREAST LUMPECTOMY Left 09/11/2020   two areas of [facility]   CATARACT EXTRACTION     bilateral   DEXA  06/2009   T score Spine: -1.8, hip -2.0   EXCISION OF BREAST BIOPSY Left 09/11/2020   Procedure: EXCISION OF BREAST BIOPSY;  Surgeon: [name], MD;  Location: [facility] ORS;  Service:  General;  Laterality: Left;   Foam sclerotherapy Bilateral 09/2015   [place], [place]   MASTECTOMY W/ SENTINEL NODE BIOPSY Left 06/25/2021   Procedure: MASTECTOMY WITH SENTINEL LYMPH NODE BIOPSY;  Surgeon: [name], MD;  Location: [facility] OR[facility];  Service: General;  Laterality: Left;   NASAL RECONSTRUCTION  2005   for [place] L nare s/p Mohs   nuclear stress test  2014   normal in [place] BX Left 09/11/2020   Procedure: PARTIAL MASTECTOMY WITH NEEDLE LOCALIZATION AND AXILLARY SENTINEL LYMPH NODE [place];  Surgeon: [name], MD;  Location: [facility] OR[facility];  Service: General;  Laterality: Left;   RADIOFREQUENCY ABLATION Left 01/2012   remnant L G saphenous vein below knee   RADIOFREQUENCY ABLATION Right 02/2013   RLE vein ablation    VEIN LIGATION AND STRIPPING  remote   bilateral G saphenous veins    There were no vitals filed for this visit.   Subjective Assessment - 01/20/22 1420     Subjective Pt reports getting knee injection tomorrow. No other concerns.    Pertinent History Neck and achilles pain. L  knee also bothers her. Had injection L knee which helped 10 days before her breast surgery June 25, 2021. Dr. [name] also states Pt needs surgery for her L knee (TKA). Pt however has a heel spur on L foot which is chronic. Went to 3 specialists for heel spur and was told that there is nothing can be done surgically. The heel pain however affects her gait which affects her L knee increasing L knee pain. Pt continues to do her HEP from previous PT. Neck pain began about 6 months ago gradually. Started feeling tightness from R medial shoulder blade to neck. Pt is R hand dominant. L foot bothers her more currently.    Currently in Pain? Yes    Pain Score 4     Pain Location Knee    Pain Orientation Left    Multiple Pain Sites Yes    Pain Location Neck    Pain Orientation Right    Pain Descriptors /  Indicators Aching;Discomfort            Manual Therapy:   STM to R upper trapezius for decreased pain and tension in R cervical region. Intermittent L cervical lateral felxion with STM for added stretch/reduction in muscle tension.  20 minutes for pain modulation. Pt in sitting.   There.ex:   Seated scap retractions: 2x20   Seated LAQ on LLE: 2x10, 2# AW  Seated alternating marches with 2# AW's: 2x10   Standing hip abd with 2# AW's: 2x10   Seated LLE knee roll outs to improve flexion/extension AROM of L knee: 2x20 reps.     Pt displaying good form/technique with exercises. Reports a decrease in neck pain post session. Verbalizes understanding for L knee AROM exercises in attempts to improve knee extension at home.      PT Education - 01/20/22 1422     Education Details form/technique with exercise    Person(s) Educated Patient    Methods Explanation;Demonstration;Tactile cues;Verbal cues    Comprehension Verbalized understanding;Returned demonstration              PT Short Term Goals - 10/09/21 1422       PT SHORT TERM GOAL #1   Title Patient will be independent with her initial HEP to improve strength, decrease neck and L heel pain, improve function.    Baseline Does her HEP, no questions (10/09/2021)    Time 3    Period Weeks    Status Achieved    Target Date 09/25/21               PT Long Term Goals - 01/05/22 1341       PT LONG TERM GOAL #1   Title Pt will have a decrease in L heel pain to 4/10 or less at worst to promote ability to ambulate, perform standing tasks more comfortably.    Baseline 8/10 L heel/Achilles pain at worst for the past 3 months (09/04/2021);  5/10 at worst (10/07/2021); 4/10 at most for the past 7 days, most of the time, 2/10, worst is getting up in the middle of the night to go to the bathroom (10/29/2021), 2/10 at most for the pst 7 days (12/02/2021); 1/10 at most for the past 7 days (01/05/2022)    Time 8    Period Weeks    Status  Achieved    Target Date 10/30/21      PT LONG TERM GOAL #2   Title Pt will improve bilateral hip extension and abduction strength by  at least 1/2 MMT grade to promote femoral control and ability to ambulate and perform closed chain tasks with less L heel/Achilles pain.    Baseline Hip extension 4-/5 R, 3+/5 L, hip abduction 3-/5 R, 4-/5 L (09/04/2021); 4+/5 R, 4/5 L, hip abduction 4-/5 R, 4/5 L (L knee pain) 10/09/2021; Hip extension 4+/5 R, 4/5 L. Hip abduction in S/L  not tested secondary to L knee pain  (10/29/2021)    Time 3    Period Weeks    Status Partially Met    Target Date 01/29/22      PT LONG TERM GOAL #3   Title Pt will improve her L foot FOTO score by at least 10 points as a demonstration of improved function.    Baseline L foot FOTO 38 (09/04/2021); 43 (10/29/2021); 53 (12/02/2021)    Time 8    Period Weeks    Status Achieved    Target Date 12/25/21      PT LONG TERM GOAL #4   Title Pt will have a decrease in neck pain to 2/10 or less at worst to promote ability to look around more comfortably.    Baseline 5/10 neck pain at worst for the past 3 months (09/04/2021), (10/07/2021), 1.5/10 at most for the past 7 day (10/29/2021); 3.5/10 at most for the past 7 days when turning her head. (01/05/2022)    Time 3    Period Weeks    Status Partially Met    Target Date 01/29/22      PT LONG TERM GOAL #5   Title Patient will have a decrease in L knee pain to 2/10 or less at worst promote better mechanics for L heel during gait.    Baseline 4/10 L knee pain at most for the past 3 weeks (12/02/2021); 2/10 (01/05/2022)    Time 3    Period Weeks    Status Partially Met    Target Date 01/29/22                   Plan - 01/20/22 1558     Clinical Impression Statement Continuing PT POC with focus on decreasing R cervical tensoin/muscle tightness and improving L knee strength and mobility. Pt tolerating new exercises well without increased pain. Pt remains limited in L knee from attaining  terminal knee extension with education on exercises to improve knee extension motion at home. Pt verbalizing understanding. Pt will continue to benefit from skilled PT intervention to improve pain and function with ADL's.    Personal Factors and Comorbidities Age;Comorbidity 3+;Time since onset of injury/illness/exacerbation;Fitness    Comorbidities Osteopenia, chronic venous insufficiency,Hx of basal cell CA S/P lumpectomy, HTN    Examination-Activity Limitations Stand;Locomotion Level;Squat;Stairs;Carry;Lift    Stability/Clinical Decision Making Stable/Uncomplicated    Rehab Potential Fair    Clinical Impairments Affecting Rehab Potential Chronicity of condition, age, fitness level    PT Frequency 2x / week    PT Duration 4 weeks    PT Treatment/Interventions Aquatic Therapy;Electrical Stimulation;Iontophoresis 4mg /ml Dexamethasone;Gait training;Stair training;Functional mobility training;Therapeutic activities;Therapeutic exercise;Balance training;Neuromuscular re-education;Patient/family education;Manual techniques;Dry needling    PT Next Visit Plan isometric plantar flexion, glute, trunk, scapular strengthening, femoral control, manual techniques, modalities PRN    PT Home Exercise Plan no changes today    Consulted and Agree with Plan of Care Patient             Patient will benefit from skilled therapeutic intervention in order to improve the following deficits and impairments:  Postural dysfunction,  Decreased strength, Improper body mechanics, Pain, Difficulty walking  Visit Diagnosis: Cervicalgia  Difficulty in walking, not elsewhere classified  Chronic pain of left knee  Pain in left foot     Problem List Patient Active Problem List   Diagnosis Date Noted   Venous ulcer of ankle, unspecified laterality ([place]) 12/24/2021   Bilateral lower extremity edema 12/17/2021   Acute respiratory infection 10/06/2021   Pre-op evaluation 06/02/2021   Right wrist pain 02/28/2021    Skin rash 02/28/2021   Ascending aorta dilatation ([place]) 01/10/2021   Left-sided chest pain 01/10/2021   Genetic testing 10/30/2020   Malignant neoplasm of overlapping sites of left breast in female, estrogen receptor positive ([place]) 10/22/2020   Family history of breast cancer    Family history of thyroid cancer    Insertional Achilles tendinopathy 08/28/2020   Breast cancer, left breast ([place]) 08/01/2020   Right carotid bruit 06/05/2020   Chest pressure 06/03/2020   Left Achilles tendinitis 09/01/2019   Low back pain 08/29/2019   Lumbosacral spondylosis without myelopathy 08/29/2019   Calcaneal spur 08/17/2019   Contusion of knee 10/04/2018   Varicose veins of bilateral lower extremities with pain 03/30/2018   Lymphedema 03/30/2018   Dizziness 02/02/2017   Situational depression 12/18/2016   Vitamin D deficiency 10/11/2016   Fatigue 08/12/2016   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 Fairly IV, PT, DPT Physical Therapist- [facility]  01/20/2022, 4:31 PM  [facility] [address], [place], [address] Phone: [phone]   Fax:  [phone]  Name: BRAELEIGH PYPER MRN: [id number] Date of Birth: [birth year]

## 2022-01-20 NOTE — Progress Notes (Signed)
[facility] Counselor/Therapist Progress Note  Patient ID: Beth Rodgers, MRN: [id number]    Date: 01/20/22  Time Spent: 1:03 pm - 1:51 pm : 48 Minutes  Treatment Type: Individual Therapy.  Reported Symptoms: Rumination, anxiety.    Mental Status Exam: Appearance:  NA     Behavior: Appropriate  Motor: NA  Speech/Language:  Clear and Coherent and Normal Rate  Affect: Congruent  Mood: dysthymic  Thought process: normal  Thought content:   WNL  Sensory/Perceptual disturbances:   WNL  Orientation: oriented to person, place, time/date, and situation  Attention: Good  Concentration: Good  Memory: WNL  Fund of knowledge:  Good  Insight:   Good  Judgment:  Good  Impulse Control: Good   Risk Assessment: Danger to Self:  No Self-injurious Behavior: No Danger to Others: No Duty to Warn:no Physical Aggression / Violence:No  Access to Firearms a concern: No  Gang Involvement:No   Subjective:   Beth Rodgers participated from home, via phone, and consented to treatment. Therapist participated from office. We met online due to [place] pandemic. Beth Rodgers reviewed the events of the past two weeks. Beth Rodgers noted the sudden loss of Beth Rodgers, a friend who helped with gardening and lawn care at her home. She noted feeling of sadness and grief as a result. We began processing this during the session. Beth Rodgers noted feeling "numb" to her relationship with  her son Beth Rodgers's behavior in the recent past. She noted lamenting not having the relationships with her children that she wished she had. We continued to process this during the session. Beth Rodgers endorsed some hypersomnia ~10 hours per day and needed to decrease her sleep by ~1. We worked on identifying ways to engage in enjoyable activities in lieu of her excessive sleep. Beth Rodgers was engaged and motivated during the session and expressed commitment towards her goals.  Therapist validated Beth Rodgers experiencing feelings during the session and provided  supportive therapy.  Beth Rodgers continues to benefit from individual therapy and future appointments had been scheduled. Beth Rodgers discussed increased health issues and the need for additional medical treatment as a result.    Interventions: Interpersonal  Diagnosis: Adjustment disorder with mixed anxiety and depressed mood  Plan: Patient is to use CBT, BA, Problem-solving, mindfulness and coping skills to help manage decrease symptoms associated with their diagnosis. (Target date: 08/16/22)   Long-term goal:   Reduce overall level, frequency, and intensity of the feelings of depression and anxiety evidenced by   decreased sadness, rumination, lethargy, and interpersonal stressors  from 6 to 7 days/week to 0 to 1 days/week per client report for at least 3 consecutive months.  Short-term goal:  Verbally express understanding of the relationship between feelings of depression, anxiety and their impact on thinking patterns and behaviors.  Verbalize an understanding of the role that distorted thinking plays in creating fears, excessive worry, and ruminations.  Engage in enjoyable activities on a consistent basis.   [name], LCSW

## 2022-01-21 DIAGNOSIS — M1712 Unilateral primary osteoarthritis, left knee: Secondary | ICD-10-CM | POA: Diagnosis not present

## 2022-01-22 ENCOUNTER — Ambulatory Visit: Payer: Medicare Other

## 2022-01-22 ENCOUNTER — Other Ambulatory Visit: Payer: Self-pay

## 2022-01-22 DIAGNOSIS — R262 Difficulty in walking, not elsewhere classified: Secondary | ICD-10-CM

## 2022-01-22 DIAGNOSIS — M25562 Pain in left knee: Secondary | ICD-10-CM

## 2022-01-22 DIAGNOSIS — M79672 Pain in left foot: Secondary | ICD-10-CM | POA: Diagnosis not present

## 2022-01-22 DIAGNOSIS — M542 Cervicalgia: Secondary | ICD-10-CM

## 2022-01-22 DIAGNOSIS — I890 Lymphedema, not elsewhere classified: Secondary | ICD-10-CM | POA: Diagnosis not present

## 2022-01-22 DIAGNOSIS — G8929 Other chronic pain: Secondary | ICD-10-CM | POA: Diagnosis not present

## 2022-01-22 NOTE — Therapy (Signed)
[facility] [address], [place], [address] Phone: [phone]   Fax:  [phone]  Physical Therapy Treatment  Patient Details  Name: Beth Rodgers MRN: [id number] Date of Birth: Feb 05, 1935 Referring Provider (PT): Owens Loffler, MD   Encounter Date: 01/22/2022   PT End of Session - 01/22/22 1029     Visit Number 29    Number of Visits 88    Date for PT Re-Evaluation 01/29/22    Authorization Type Medicare    Progress Note Due on Visit 45    PT Start Time 1018    PT Stop Time 1100    PT Time Calculation (min) 42 min    Activity Tolerance Patient tolerated treatment well    Behavior During Therapy [facility] for tasks assessed/performed             Past Medical History:  Diagnosis Date   Basal cell carcinoma ([place])    txted with MOHs in past   Cellulitis 08/10/2018   Chronic venous insufficiency    with varicose veins   Family history of breast cancer    Family history of thyroid cancer    GERD (gastroesophageal reflux disease)    Glaucoma    History  of basal cell carcinoma    left nare/BREAST CANCER   History of chicken pox    Hypertension    Hypoglycemia    Hypothyroid    Osteoarthritis    back pain, L knee pain   Osteopenia 06/2009   DEXA 11/2015: T -2.3 hip, -2.2 spine, on longterm bisphosphonate/evista   Psoriasis    Pyogenic granuloma 04/16/2016    Past Surgical History:  Procedure Laterality Date   BASAL CELL CARCINOMA EXCISION     located on face   BREAST BIOPSY Right    core done ing [place]?   BREAST BIOPSY Left 08/16/2020   3 area bx 4:00 X, IMC, 6:00Q IMC, LN hydro #3-benign   BREAST LUMPECTOMY Left 09/11/2020   two areas of [facility]   CATARACT EXTRACTION     bilateral   DEXA  06/2009   T score Spine: -1.8, hip -2.0   EXCISION OF BREAST BIOPSY Left 09/11/2020   Procedure: EXCISION OF BREAST BIOPSY;  Surgeon: [name], MD;  Location: [facility] ORS;  Service:  General;  Laterality: Left;   Foam sclerotherapy Bilateral 09/2015   Reed Pandy, [place]   MASTECTOMY W/ SENTINEL NODE BIOPSY Left 06/25/2021   Procedure: MASTECTOMY WITH SENTINEL LYMPH NODE BIOPSY;  Surgeon: [name], MD;  Location: [facility] ORS;  Service: General;  Laterality: Left;   NASAL RECONSTRUCTION  2005   for [place] L nare s/p Mohs   nuclear stress test  2014   normal in [place] BX Left 09/11/2020   Procedure: PARTIAL MASTECTOMY WITH NEEDLE LOCALIZATION AND AXILLARY SENTINEL LYMPH NODE [place];  Surgeon: [name], MD;  Location: [facility] ORS;  Service: General;  Laterality: Left;   RADIOFREQUENCY ABLATION Left 01/2012   remnant L G saphenous vein below knee   RADIOFREQUENCY ABLATION Right 02/2013   RLE vein ablation    VEIN LIGATION AND STRIPPING  remote   bilateral G saphenous veins    There were no vitals filed for this visit.   Subjective Assessment - 01/22/22 1025     Subjective Pt states L knee injection went well. Minor discomfort in knee and and neck.    Pertinent History  Neck and achilles pain. L knee also bothers her. Had injection L knee which helped 10 days before her breast surgery June 25, 2021. Dr. [name] also states Pt needs surgery for her L knee (TKA). Pt however has a heel spur on L foot which is chronic. Went to 3 specialists for heel spur and was told that there is nothing can be done surgically. The heel pain however affects her gait which affects her L knee increasing L knee pain. Pt continues to do her HEP from previous PT. Neck pain began about 6 months ago gradually. Started feeling tightness from R medial shoulder blade to neck. Pt is R hand dominant. L foot bothers her more currently.    Currently in Pain? Yes    Pain Score 1             there.ex:   Nu-Step L1 for 6 min with UE/LE use for LE warm up and UE protraction/retraction to improve cervical  tension.   Seated L knee ball roll outs for L knee flexion/extension: 2x20, cuing for 5 sec holds at end ranges for improved knee AROM   Seated hamstring stretch with belt: 4x30 sec holds, cuing for keeping L heel on ground  Hook lying:    Quad set with towel under heel: 2x12 reps    SLR with 2# AW: 2x6 reps  Mini Squats with BUE support and chair placed behind pt for safety. Pt requires PT demo and cuing for post hip hinge to improve hip engagement and reduce knee joint compression. Good carryover after cuing.   Re-assessment of gait post session. Pt ambulating with reduction in antalgic gait, improved upright posture, and longer step lengths from step to pattern to initiating step through pattern. Pt reports no pain after PT session.    PT Education - 01/22/22 1027     Education Details form/technique with exercise.    Person(s) Educated Patient    Methods Explanation;Demonstration;Tactile cues;Verbal cues    Comprehension Verbalized understanding;Returned demonstration              PT Short Term Goals - 10/09/21 1422       PT SHORT TERM GOAL #1   Title Patient will be independent with her initial HEP to improve strength, decrease neck and L heel pain, improve function.    Baseline Does her HEP, no questions (10/09/2021)    Time 3    Period Weeks    Status Achieved    Target Date 09/25/21               PT Long Term Goals - 01/05/22 1341       PT LONG TERM GOAL #1   Title Pt will have a decrease in L heel pain to 4/10 or less at worst to promote ability to ambulate, perform standing tasks more comfortably.    Baseline 8/10 L heel/Achilles pain at worst for the past 3 months (09/04/2021);  5/10 at worst (10/07/2021); 4/10 at most for the past 7 days, most of the time, 2/10, worst is getting up in the middle of the night to go to the bathroom (10/29/2021), 2/10 at most for the pst 7 days (12/02/2021); 1/10 at most for the past 7 days (01/05/2022)    Time 8    Period Weeks     Status Achieved    Target Date 10/30/21      PT LONG TERM GOAL #2   Title Pt will improve bilateral hip extension and abduction strength by  at least 1/2 MMT grade to promote femoral control and ability to ambulate and perform closed chain tasks with less L heel/Achilles pain.    Baseline Hip extension 4-/5 R, 3+/5 L, hip abduction 3-/5 R, 4-/5 L (09/04/2021); 4+/5 R, 4/5 L, hip abduction 4-/5 R, 4/5 L (L knee pain) 10/09/2021; Hip extension 4+/5 R, 4/5 L. Hip abduction in S/L  not tested secondary to L knee pain  (10/29/2021)    Time 3    Period Weeks    Status Partially Met    Target Date 01/29/22      PT LONG TERM GOAL #3   Title Pt will improve her L foot FOTO score by at least 10 points as a demonstration of improved function.    Baseline L foot FOTO 38 (09/04/2021); 43 (10/29/2021); 53 (12/02/2021)    Time 8    Period Weeks    Status Achieved    Target Date 12/25/21      PT LONG TERM GOAL #4   Title Pt will have a decrease in neck pain to 2/10 or less at worst to promote ability to look around more comfortably.    Baseline 5/10 neck pain at worst for the past 3 months (09/04/2021), (10/07/2021), 1.5/10 at most for the past 7 day (10/29/2021); 3.5/10 at most for the past 7 days when turning her head. (01/05/2022)    Time 3    Period Weeks    Status Partially Met    Target Date 01/29/22      PT LONG TERM GOAL #5   Title Patient will have a decrease in L knee pain to 2/10 or less at worst promote better mechanics for L heel during gait.    Baseline 4/10 L knee pain at most for the past 3 weeks (12/02/2021); 2/10 (01/05/2022)    Time 3    Period Weeks    Status Partially Met    Target Date 01/29/22                   Plan - 01/22/22 1030     Clinical Impression Statement Progressing L knee mobility and strengthening due to improvement in symptoms from joint lubrication injection. Pt tolerating progression well without increase L knee pain. Heavy focus on extension AROM and  strengthening of hip, quad, and hamstring to improve mobility and strength for reduced pain and improved gait mechanics. Pt will continue to benefit from skilled PT intervention to improve pain and function with ADL's.    Personal Factors and Comorbidities Age;Comorbidity 3+;Time since onset of injury/illness/exacerbation;Fitness    Comorbidities Osteopenia, chronic venous insufficiency,Hx of basal cell CA S/P lumpectomy, HTN    Examination-Activity Limitations Stand;Locomotion Level;Squat;Stairs;Carry;Lift    Stability/Clinical Decision Making Stable/Uncomplicated    Rehab Potential Fair    Clinical Impairments Affecting Rehab Potential Chronicity of condition, age, fitness level    PT Frequency 2x / week    PT Duration 4 weeks    PT Treatment/Interventions Aquatic Therapy;Electrical Stimulation;Iontophoresis 4mg /ml Dexamethasone;Gait training;Stair training;Functional mobility training;Therapeutic activities;Therapeutic exercise;Balance training;Neuromuscular re-education;Patient/family education;Manual techniques;Dry needling    PT Next Visit Plan isometric plantar flexion, glute, trunk, scapular strengthening, femoral control, manual techniques, modalities PRN    PT Home Exercise Plan no changes today    Consulted and Agree with Plan of Care Patient             Patient will benefit from skilled therapeutic intervention in order to improve the following deficits and impairments:  Postural dysfunction, Decreased strength, Improper  body mechanics, Pain, Difficulty walking  Visit Diagnosis: Cervicalgia  Difficulty in walking, not elsewhere classified  Chronic pain of left knee  Pain in left foot     Problem List Patient Active Problem List   Diagnosis Date Noted   Venous ulcer of ankle, unspecified laterality ([place]) 12/24/2021   Bilateral lower extremity edema 12/17/2021   Acute respiratory infection 10/06/2021   Pre-op evaluation 06/02/2021   Right wrist pain 02/28/2021    Skin rash 02/28/2021   Ascending aorta dilatation ([place]) 01/10/2021   Left-sided chest pain 01/10/2021   Genetic testing 10/30/2020   Malignant neoplasm of overlapping sites of left breast in female, estrogen receptor positive ([place]) 10/22/2020   Family history of breast cancer    Family history of thyroid cancer    Insertional Achilles tendinopathy 08/28/2020   Breast cancer, left breast ([place]) 08/01/2020   Right carotid bruit 06/05/2020   Chest pressure 06/03/2020   Left Achilles tendinitis 09/01/2019   Low back pain 08/29/2019   Lumbosacral spondylosis without myelopathy 08/29/2019   Calcaneal spur 08/17/2019   Contusion of knee 10/04/2018   Varicose veins of bilateral lower extremities with pain 03/30/2018   Lymphedema 03/30/2018   Dizziness 02/02/2017   Situational depression 12/18/2016   Vitamin D deficiency 10/11/2016   Fatigue 08/12/2016   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 Fairly IV, PT, DPT Physical Therapist- [facility]  01/22/2022, 12:00 PM  [facility] [address], [place], [address] Phone: [phone]   Fax:  [phone]  Name: Beth Rodgers MRN: [id number] Date of Birth: 1935-12-13

## 2022-01-27 ENCOUNTER — Other Ambulatory Visit: Payer: Self-pay

## 2022-01-27 ENCOUNTER — Ambulatory Visit: Payer: Medicare Other

## 2022-01-27 DIAGNOSIS — M542 Cervicalgia: Secondary | ICD-10-CM

## 2022-01-27 DIAGNOSIS — R262 Difficulty in walking, not elsewhere classified: Secondary | ICD-10-CM

## 2022-01-27 DIAGNOSIS — M79672 Pain in left foot: Secondary | ICD-10-CM | POA: Diagnosis not present

## 2022-01-27 DIAGNOSIS — M25562 Pain in left knee: Secondary | ICD-10-CM

## 2022-01-27 DIAGNOSIS — I890 Lymphedema, not elsewhere classified: Secondary | ICD-10-CM | POA: Diagnosis not present

## 2022-01-27 DIAGNOSIS — G8929 Other chronic pain: Secondary | ICD-10-CM

## 2022-01-27 NOTE — Therapy (Signed)
[facility] [address] [place], [place], [address] Phone: [phone]   Fax:  [phone]  Physical Therapy Treatment And Progress Report (12/02/2021 - 01/27/2022) And Discharge Summary  Patient Details  Name: Beth Rodgers MRN: [id number] Date of Birth: [birth year] Referring Provider (PT): Owens Loffler, MD   Encounter Date: 01/27/2022   PT End of Session - 01/27/22 1421     Visit Number 30    Number of Visits 33    Date for PT Re-Evaluation 01/29/22    Authorization Type Medicare    Progress Note Due on Visit 58    PT Start Time 1421    PT Stop Time 1458    PT Time Calculation (min) 37 min    Activity Tolerance Patient tolerated treatment well    Behavior During Therapy [facility] for tasks assessed/performed             Past Medical History:  Diagnosis Date   Basal cell carcinoma ([place])    txted with MOHs in past   Cellulitis 08/10/2018   Chronic venous insufficiency    with varicose veins   Family history of breast cancer    Family history of thyroid cancer    GERD (gastroesophageal reflux disease)    Glaucoma    History  of basal cell carcinoma    left nare/BREAST CANCER   History of chicken pox    Hypertension    Hypoglycemia    Hypothyroid    Osteoarthritis    back pain, L knee pain   Osteopenia 06/2009   DEXA 11/2015: T -2.3 hip, -2.2 spine, on longterm bisphosphonate/evista   Psoriasis    Pyogenic granuloma 04/16/2016    Past Surgical History:  Procedure Laterality Date   BASAL CELL CARCINOMA EXCISION     located on face   BREAST BIOPSY Right    core done ing [place]?   BREAST BIOPSY Left 08/16/2020   3 area bx 4:00 X, IMC, 6:00Q IMC, LN hydro #3-benign   BREAST LUMPECTOMY Left 09/11/2020   two areas of [facility]   CATARACT EXTRACTION     bilateral   DEXA  06/2009   T score Spine: -1.8, hip -2.0   EXCISION OF BREAST BIOPSY Left 09/11/2020   Procedure: EXCISION OF BREAST BIOPSY;   Surgeon: [name], MD;  Location: [facility] ORS;  Service: General;  Laterality: Left;   Foam sclerotherapy Bilateral 09/2015   Re[place], [place]   MASTECTOMY W/ SENTINEL NODE BIOPSY Left 06/25/2021   Procedure: MASTECTOMY WITH SENTINEL LYMPH NODE BIOPSY;  Surgeon: [name], MD;  Location: [facility] ORS;  Service: General;  Laterality: Left;   NASAL RECONSTRUCTION  2005   for [place] L nare s/p Mohs   nuclear stress test  2014   normal in [place] BX Left 09/11/2020   Procedure: PARTIAL MASTECTOMY WITH NEEDLE LOCALIZATION AND AXILLARY SENTINEL LYMPH NODE [place];  Surgeon: [name], MD;  Location: [facility] ORS;  Service: General;  Laterality: Left;   RADIOFREQUENCY ABLATION Left 01/2012   remnant L G saphenous vein below knee   RADIOFREQUENCY ABLATION Right 02/2013   RLE vein ablation    VEIN LIGATION AND STRIPPING  remote   bilateral G saphenous veins    There were no vitals filed for this visit.   Subjective Assessment - 01/27/22 1422     Subjective Neck is improved. Continued doing her exercises at home. The  heel has improved a great deal, once in a while she feels it. If she felt what she feels now, she would not ask for therapy (for her heel). It has been tremendous improvement with everything. The shot in her L knee went well, getting a second shot tomorrow (hyaluronic acid). Just a little bit of R posterior neck tightness. No L heel pain currently. No L knee pain currently. Feels like L knee injection causes tension in her neck and would like to continue some neck treatments now and again while working on her L knee. Between the neck and the knee, the knee is more important to her.    Pertinent History Neck and achilles pain. L knee also bothers her. Had injection L knee which helped 10 days before her breast surgery June 25, 2021. Dr. [name] also states Pt needs surgery for her L  knee (TKA). Pt however has a heel spur on L foot which is chronic. Went to 3 specialists for heel spur and was told that there is nothing can be done surgically. The heel pain however affects her gait which affects her L knee increasing L knee pain. Pt continues to do her HEP from previous PT. Neck pain began about 6 months ago gradually. Started feeling tightness from R medial shoulder blade to neck. Pt is R hand dominant. L foot bothers her more currently.    Currently in Pain? No/denies                                        PT Education - 01/27/22 1837     Education Details ther-ex, POC    Person(s) Educated Patient    Methods Explanation;Demonstration;Tactile cues;Verbal cues    Comprehension Returned demonstration;Verbalized understanding           Objective     No latex band allergies   B varicose veins legs Osteopenia     L heel pain Hx of L mastectomy on June 25, 2021 and had home health PT for recovery.      Started using a SPC 5 months ago when ambulating by herself. Furniture walk at home.    Medbridge Access Code [email or web address]      Therapeutic exercise   Seated manually resisted hip extension 1-2x each way for each LE   Reviewed POC: D/C heel PT and start PT for L knee and neck (eval) next visit.   Wants to work on Industrial/product designer as well.   Hooklying manually reisted hip abduction isometrics, leg straight              L 10x3 with 5 second holds   Hooklying hip extension leg straight to promote glute max strength             L 10x3 with 5 second holds then 6x5 seconds         Improved exercise technique, movement at target joints, use of target muscles after min to mod verbal, visual, tactile cues.        Response to treatment Pt tolerated session well without aggravation of symptoms. Decreased neck, L knee and L heel pain reported after session.         Clinical Impression PT demonstrates overall decreased L heel and  neck pain, improved hip extension strength and improved L foot function since initial evaluation. Pt has made very good progress with PT towards goals. Skilled physical  therapy services for L Achilles discharged with pt continuing with her exercises at home. Pt to undergo L knee eval (and to include neck pain if able) next visit to fulfill L knee pain prescription.       PT Short Term Goals - 10/09/21 1422       PT SHORT TERM GOAL #1   Title Patient will be independent with her initial HEP to improve strength, decrease neck and L heel pain, improve function.    Baseline Does her HEP, no questions (10/09/2021)    Time 3    Period Weeks    Status Achieved    Target Date 09/25/21               PT Long Term Goals - 01/27/22 1425       PT LONG TERM GOAL #1   Title Pt will have a decrease in L heel pain to 4/10 or less at worst to promote ability to ambulate, perform standing tasks more comfortably.    Baseline 8/10 L heel/Achilles pain at worst for the past 3 months (09/04/2021);  5/10 at worst (10/07/2021); 4/10 at most for the past 7 days, most of the time, 2/10, worst is getting up in the middle of the night to go to the bathroom (10/29/2021), 2/10 at most for the pst 7 days (12/02/2021); 1/10 at most for the past 7 days (01/05/2022), (01/27/2022)    Time 8    Period Weeks    Status Achieved    Target Date 10/30/21      PT LONG TERM GOAL #2   Title Pt will improve bilateral hip extension and abduction strength by at least 1/2 MMT grade to promote femoral control and ability to ambulate and perform closed chain tasks with less L heel/Achilles pain.    Baseline Hip extension 4-/5 R, 3+/5 L, hip abduction 3-/5 R, 4-/5 L (09/04/2021); 4+/5 R, 4/5 L, hip abduction 4-/5 R, 4/5 L (L knee pain) 10/09/2021; Hip extension 4+/5 R, 4/5 L. Hip abduction in S/L  not tested secondary to L knee pain  (10/29/2021); hip extension 4+/5 R and L. Unable to measure hip abduction strength secondary to R hip  bursitis (01/27/2022)    Time 3    Period Weeks    Status Partially Met    Target Date 01/29/22      PT LONG TERM GOAL #3   Title Pt will improve her L foot FOTO score by at least 10 points as a demonstration of improved function.    Baseline L foot FOTO 38 (09/04/2021); 43 (10/29/2021); 53 (12/02/2021); 62 (01/27/2022)    Time 8    Period Weeks    Status Achieved    Target Date 12/25/21      PT LONG TERM GOAL #4   Title Pt will have a decrease in neck pain to 2/10 or less at worst to promote ability to look around more comfortably.    Baseline 5/10 neck pain at worst for the past 3 months (09/04/2021), (10/07/2021), 1.5/10 at most for the past 7 day (10/29/2021); 3.5/10 at most for the past 7 days when turning her head. (01/05/2022); 1-2/10 neck pain at most for the past 7 days (01/27/2022)    Time 3    Period Weeks    Status Achieved    Target Date 01/29/22      PT LONG TERM GOAL #5   Title Patient will have a decrease in L knee pain to 2/10 or less at  worst promote better mechanics for L heel during gait.    Baseline 4/10 L knee pain at most for the past 3 weeks (12/02/2021); 2/10 (01/05/2022); 1-2/10 at most for the past 7 days (had a shot in her knee last week) (01/27/2022)    Time 3    Period Weeks    Status Partially Met    Target Date 01/29/22                   Plan - 01/27/22 1837     Clinical Impression Statement PT demonstrates overall decreased L heel and neck pain, improved hip extension strength and improved L foot function since initial evaluation. Pt has made very good progress with PT towards goals. Skilled physical therapy services for L Achilles discharged with pt continuing with her exercises at home. Pt to undergo L knee eval (and to include neck pain if able) next visit to fulfill L knee pain prescription.    Personal Factors and Comorbidities Age;Comorbidity 3+;Time since onset of injury/illness/exacerbation;Fitness    Comorbidities Osteopenia, chronic venous  insufficiency,Hx of basal cell CA S/P lumpectomy, HTN    Examination-Activity Limitations Stand;Locomotion Level;Squat;Stairs;Carry;Lift    Stability/Clinical Decision Making Stable/Uncomplicated    Clinical Decision Making Low    Rehab Potential Fair    Clinical Impairments Affecting Rehab Potential Chronicity of condition, age, fitness level    PT Treatment/Interventions Gait training;Functional mobility training;Therapeutic activities;Therapeutic exercise;Neuromuscular re-education;Patient/family education;Manual techniques    PT Next Visit Plan Continue progress with her Achilles with her exercises at home.    PT Home Exercise Plan Medbridge Access Code [email or web address]    Consulted and Agree with Plan of Care Patient             Patient will benefit from skilled therapeutic intervention in order to improve the following deficits and impairments:  Postural dysfunction, Decreased strength, Improper body mechanics, Pain, Difficulty walking  Visit Diagnosis: Cervicalgia  Difficulty in walking, not elsewhere classified  Chronic pain of left knee  Pain in left foot     Problem List Patient Active Problem List   Diagnosis Date Noted   Venous ulcer of ankle, unspecified laterality ([place]) 12/24/2021   Bilateral lower extremity edema 12/17/2021   Acute respiratory infection 10/06/2021   Pre-op evaluation 06/02/2021   Right wrist pain 02/28/2021   Skin rash 02/28/2021   Ascending aorta dilatation ([place]) 01/10/2021   Left-sided chest pain 01/10/2021   Genetic testing 10/30/2020   Malignant neoplasm of overlapping sites of left breast in female, estrogen receptor positive ([place]) 10/22/2020   Family history of breast cancer    Family history of thyroid cancer    Insertional Achilles tendinopathy 08/28/2020   Breast cancer, left breast ([place]) 08/01/2020   Right carotid bruit 06/05/2020   Chest pressure 06/03/2020   Left Achilles tendinitis 09/01/2019   Low back pain 08/29/2019    Lumbosacral spondylosis without myelopathy 08/29/2019   Calcaneal spur 08/17/2019   Contusion of knee 10/04/2018   Varicose veins of bilateral lower extremities with pain 03/30/2018   Lymphedema 03/30/2018   Dizziness 02/02/2017   Situational depression 12/18/2016   Vitamin D deficiency 10/11/2016   Fatigue 08/12/2016   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 04/14/2016   Essential hypertension 12/17/2015   Dermatitis of external ear 12/17/2015   Corn of foot 05/16/2015   Advanced care planning/counseling discussion 10/30/2014   Neck pain on right side 06/25/2014   Primary osteoarthritis of left knee 08/17/2013   Oropharyngeal dysphagia  11/15/2012   Stressful life events affecting family and household 03/30/2012   Medicare annual wellness visit, subsequent 03/30/2012   History of basal cell carcinoma    Osteoarthritis    Osteopenia    Hypothyroidism    Glaucoma    Chronic venous insufficiency    Joneen Boers PT, DPT  01/27/2022, 6:47 PM  [facility] [address], [place], [address] Phone: [phone]   Fax:  [phone]  Name: Beth Rodgers MRN: [id number] Date of Birth: 1935/06/15

## 2022-01-28 DIAGNOSIS — M1712 Unilateral primary osteoarthritis, left knee: Secondary | ICD-10-CM | POA: Diagnosis not present

## 2022-01-29 ENCOUNTER — Ambulatory Visit: Payer: Medicare Other | Attending: Orthopedic Surgery

## 2022-01-29 ENCOUNTER — Other Ambulatory Visit: Payer: Self-pay

## 2022-01-29 DIAGNOSIS — M6281 Muscle weakness (generalized): Secondary | ICD-10-CM | POA: Insufficient documentation

## 2022-01-29 DIAGNOSIS — M25562 Pain in left knee: Secondary | ICD-10-CM | POA: Diagnosis not present

## 2022-01-29 DIAGNOSIS — G8929 Other chronic pain: Secondary | ICD-10-CM | POA: Diagnosis not present

## 2022-01-29 DIAGNOSIS — R262 Difficulty in walking, not elsewhere classified: Secondary | ICD-10-CM | POA: Diagnosis not present

## 2022-01-29 DIAGNOSIS — I890 Lymphedema, not elsewhere classified: Secondary | ICD-10-CM | POA: Diagnosis not present

## 2022-01-29 NOTE — Therapy (Signed)
[facility] [address], [place], [address] Phone: [phone]   Fax:  [phone]  Physical Therapy Evaluation  Patient Details  Name: Beth Rodgers MRN: [id number] Date of Birth: November 11, 1935 Referring Provider (PT): Owens Loffler, MD   Encounter Date: 01/29/2022   PT End of Session - 01/30/22 0850     Visit Number 1    Number of Visits 17    Date for PT Re-Evaluation 03/27/22    PT Start Time 3903    PT Stop Time 1500    PT Time Calculation (min) 40 min    Activity Tolerance Patient tolerated treatment well    Behavior During Therapy [facility] for tasks assessed/performed             Past Medical History:  Diagnosis Date   Basal cell carcinoma ([place])    txted with MOHs in past   Cellulitis 08/10/2018   Chronic venous insufficiency    with varicose veins   Family history of breast cancer    Family history of thyroid cancer    GERD (gastroesophageal reflux disease)    Glaucoma    History  of basal cell carcinoma    left nare/BREAST CANCER   History of chicken pox    Hypertension    Hypoglycemia    Hypothyroid    Osteoarthritis    back pain, L knee pain   Osteopenia 06/2009   DEXA 11/2015: T -2.3 hip, -2.2 spine, on longterm bisphosphonate/evista   Psoriasis    Pyogenic granuloma 04/16/2016    Past Surgical History:  Procedure Laterality Date   BASAL CELL CARCINOMA EXCISION     located on face   BREAST BIOPSY Right    core done ing [place]?   BREAST BIOPSY Left 08/16/2020   3 area bx 4:00 X, IMC, 6:00Q IMC, LN hydro #3-benign   BREAST LUMPECTOMY Left 09/11/2020   two areas of [employer]   CATARACT EXTRACTION     bilateral   DEXA  06/2009   T score Spine: -1.8, hip -2.0   EXCISION OF BREAST BIOPSY Left 09/11/2020   Procedure: EXCISION OF BREAST BIOPSY;  Surgeon: [name], MD;  Location: [facility] ORS;  Service: General;  Laterality: Left;   Foam sclerotherapy Bilateral 09/2015    [place], [place]   MASTECTOMY W/ SENTINEL NODE BIOPSY Left 06/25/2021   Procedure: MASTECTOMY WITH SENTINEL LYMPH NODE BIOPSY;  Surgeon: [name], MD;  Location: [facility] ORS;  Service: General;  Laterality: Left;   NASAL RECONSTRUCTION  2005   for [place] L nare s/p Mohs   nuclear stress test  2014   normal in [place] BX Left 09/11/2020   Procedure: PARTIAL MASTECTOMY WITH NEEDLE LOCALIZATION AND AXILLARY SENTINEL LYMPH NODE [place];  Surgeon: [name], MD;  Location: [facility] ORS;  Service: General;  Laterality: Left;   RADIOFREQUENCY ABLATION Left 01/2012   remnant L G saphenous vein below knee   RADIOFREQUENCY ABLATION Right 02/2013   RLE vein ablation    VEIN LIGATION AND STRIPPING  remote   bilateral G saphenous veins    There were no vitals filed for this visit.    Subjective Assessment - 01/29/22 1426     Subjective Pt familar to clinic. Presenting to PT for L knee evaluation.    Pertinent History Pt is a pleasant 87 y.o. female referred to OP PT for L knee pain. PMH includes:  HTN, history of breast cancer, chronic venous insufficiency, history of basal cell carcinoma, GERD. Pt  reports having bad arthritis in L knee. Pain is worsened with quick movements, too much walking, maintaining knee in a certain position for too long, turning in her sleep hurts. Pain described as sharp and deep and is denying stiffness due to being active with HEP from previous therapy she just finished earlier this week. Pain is improved with consistent exercise and rest. Pt's goal is to improve pain and mobility in L knee and balance/walking tolerance for community ADL's.    Limitations Standing;Walking    Patient Stated Goals Self management of L knee pain and improving wlaking ADL's    Currently in Pain? No/denies    Pain Onset More than a month ago    Pain Frequency Intermittent    Aggravating Factors   Squatting, turning quickly, bending knee too far            OBJECTIVE  MUSCULOSKELETAL: Tremor: Absent Bulk: Normal Tone: Normal, no spasticity, rigidity, or clonus appreciated  Posture Standing with crouched posture at knees due to Knee extension AROM limitations. Forward head and rounded shoulders.  Lumbar/Hip AROM: Lumbar not assessed. Hip AROM full in flexion, pain with IR and ER  Gait Pt ambulates with crouched and antalgic gait on LLE with decreased stance on LLE leading to decreased step length on RLE with limited hip extension bilat during push off.  Palpation Graded on 0-4 scale (0 = no pain, 1 = pain, 2 = pain with wincing/grimacing/flinching, 3 = pain with withdrawal, 4 = unwilling to allow palpation), NT= not tested Location Medial  Lateral   Structure Comments  MCL 0     LCL  0    Joint Line 2 2    Patella 2 2 0   Patellar Tendon 2 2 0   Quadriceps Tendon   2   Quadriceps   0   Hamstring Tendons 0 0    Hamstrings 0 0      Strength R/L 4/4 Hip flexion 4/4* Hip external rotation 4/3+* Hip internal rotation 4/4 Hip abduction 5/5 Hip adduction 5/5 Knee extension 5/5 Knee flexion 5/5 Ankle Dorsiflexion 5/5 Ankle Plantarflexion *indicates pain  AROM Knee R/L Flexion: 125/120*  Extension: 2 deg /7 deg *indicates pain  Hip R/L Flexion: > 100 deg bilat Internal Rotation:29/30* External Rotation: 25/35 *indicates pain   Passive Accessory Motion Knee: AP/PA tibiofemoral hypomobile bilat. Painful on L in both directions Patella: Hypomobile with pain in all planes on L. Hypomobile and painless on R.  NEUROLOGICAL:  Mental Status Patient's fund of knowledge is within normal limits for educational level.  Sensation Grossly intact to light touch bilateral LEs as determined by testing dermatomes L2-S2 Proprioception and hot/cold testing deferred on this date   SPECIAL TESTS   Motor Control: Squat assessment: Decreased hip hinge with ant  knees crossing over toes and concordant pain on L   FUNCTIONAL TESTS Deep squat: Positive    5xSTS: 18.13 sec. 2 minute walk test: 66' with no AD. Age matched norms is 495'.   [facility] PT Assessment - 01/30/22 0001       Assessment   Medical Diagnosis L knee pain    Referring Provider (PT) Copland, Spencer, MD    Hand Dominance Right    Prior Therapy Yes      Precautions   Precautions None      Restrictions   Weight Bearing Restrictions No      Balance  Screen   Has the patient fallen in the past 6 months No    Has the patient had a decrease in activity level because of a fear of falling?  No    Is the patient reluctant to leave their home because of a fear of falling?  No      Prior Function   Level of Independence Independent with community mobility with device    Vocation Retired      Associate Professor   Overall Cognitive Status Within Functional Limits for tasks assessed              Objective measurements completed on examination: See above findings.    PT Education - 01/30/22 0849     Education Details HEP and POC.    Person(s) Educated Patient    Methods Explanation;Demonstration;Tactile cues;Verbal cues;Handout    Comprehension Verbalized understanding;Returned demonstration;Verbal cues required;Tactile cues required              PT Short Term Goals - 01/30/22 0858       PT SHORT TERM GOAL #1   Title Pt will be independent with HEP to improve L knee AROM and global strength to improve walking tasks    Baseline 01/29/22: Initiated    Time 4    Period Weeks    Status New    Target Date 02/26/22               PT Long Term Goals - 01/30/22 0859       PT LONG TERM GOAL #1   Title Pt will improve knee FOTO to target score to display clinically significant improvement in functional mobility.    Baseline 01/29/22: Next session    Time 8    Period Weeks    Status New    Target Date 03/26/22      PT LONG TERM GOAL #2   Title Pt will improve L  Knee extension by 5 deg or greater to demonstrate clinically significant improvement for improved gait mechanics.    Baseline 01/29/2022: L knee lacking 7 deg from TKE. R knee extension lacking 2 deg from TKE.    Time 8    Period Weeks    Status New    Target Date 03/26/22      PT LONG TERM GOAL #3   Title Pt will improve 2 min walk test to 49' for age matched norms to improve community ambulation tasks and reduced risk of falls.    Baseline 01/29/22: 316'    Time 8    Period Weeks    Status New    Target Date 03/26/22      PT LONG TERM GOAL #4   Title Pt will improve 5xSTS  to </= 12 sec to demonstrate clinically significant improvement in LE strength.    Baseline 01/29/22: 18.13 sec with UE use on arm rests    Time 8    Period Weeks    Status New    Target Date 03/26/22                    Plan - 01/30/22 0850     Clinical Impression Statement Pt is a pleasant 87 y.o. female referred to PT for L knee pain. Familiar to clinic with postive results with PT from other orthopedic conditions. To date, pt presenting with deficits in L knee extension AROM, knee joint hypomobility, muscle weakness as evidenced by 5xSTS and MMT, and below age matched norms for 2 min  walk test placing pt at increased risk of falls. Pt presents with abnormal gait with knee flexed, decreased step length bilat with antalgic gait on LLE during stance phase and limited hip extension during push off due to hip flexor tightness. Pt also displays poor squat mechanics with limited hip hinge in squat causing concordant knee pain which is lessened after education and performance of hip hinge. These deficits are placing pt at increased risk of falls and decreased mobility to complete household and community ADL's safely thus pt will benefit from skilled PT services to improve safe mobility and reduced pain with ADL completion and decreased risk of falls.    Personal Factors and Comorbidities Age;Comorbidity 3+;Time since  onset of injury/illness/exacerbation;Fitness;Past/Current Experience    Comorbidities Osteopenia, chronic venous insufficiency,Hx of basal cell CA S/P lumpectomy, HTN    Examination-Activity Limitations Stand;Locomotion Level;Squat;Stairs;Carry;Lift;Bed Mobility;Transfers    Examination-Participation Restrictions Community Activity    Stability/Clinical Decision Making Stable/Uncomplicated    Clinical Decision Making Low    Rehab Potential Fair    Clinical Impairments Affecting Rehab Potential Chronicity of condition, age, fitness level    PT Frequency 2x / week    PT Duration 8 weeks    PT Treatment/Interventions Gait training;Functional mobility training;Therapeutic activities;Therapeutic exercise;Neuromuscular re-education;Patient/family education;Manual techniques;ADLs/Self Care Home Management;Aquatic Therapy;Cryotherapy;Electrical Stimulation;Moist Heat;Traction;Passive range of motion    PT Next Visit Plan Reassess HEP, address knee extension AROM limitations    PT Home Exercise Plan Access Code Outlook and Agree with Plan of Care Patient             Patient will benefit from skilled therapeutic intervention in order to improve the following deficits and impairments:  Postural dysfunction, Decreased strength, Improper body mechanics, Pain, Difficulty walking, Abnormal gait, Hypomobility, Decreased activity tolerance, Decreased mobility, Decreased range of motion  Visit Diagnosis: Chronic pain of left knee  Difficulty in walking, not elsewhere classified  Muscle weakness (generalized)     Problem List Patient Active Problem List   Diagnosis Date Noted   Venous ulcer of ankle, unspecified laterality ([place]) 12/24/2021   Bilateral lower extremity edema 12/17/2021   Acute respiratory infection 10/06/2021   Pre-op evaluation 06/02/2021   Right wrist pain 02/28/2021   Skin rash 02/28/2021   Ascending aorta dilatation ([place]) 01/10/2021   Left-sided chest pain  01/10/2021   Genetic testing 10/30/2020   Malignant neoplasm of overlapping sites of left breast in female, estrogen receptor positive ([place]) 10/22/2020   Family history of breast cancer    Family history of thyroid cancer    Insertional Achilles tendinopathy 08/28/2020   Breast cancer, left breast ([place]) 08/01/2020   Right carotid bruit 06/05/2020   Chest pressure 06/03/2020   Left Achilles tendinitis 09/01/2019   Low back pain 08/29/2019   Lumbosacral spondylosis without myelopathy 08/29/2019   Calcaneal spur 08/17/2019   Contusion of knee 10/04/2018   Varicose veins of bilateral lower extremities with pain 03/30/2018   Lymphedema 03/30/2018   Dizziness 02/02/2017   Situational depression 12/18/2016   Vitamin D deficiency 10/11/2016   Fatigue 08/12/2016   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 04/14/2016   Essential hypertension 12/17/2015   Dermatitis of external ear 12/17/2015   Corn of foot 05/16/2015   Advanced care planning/counseling discussion 10/30/2014   Neck pain on right side 06/25/2014   Primary osteoarthritis of left knee 08/17/2013   Oropharyngeal dysphagia 11/15/2012   Stressful life events affecting family and household 03/30/2012   Medicare annual  wellness visit, subsequent 03/30/2012   History of basal cell carcinoma    Osteoarthritis    Osteopenia    Hypothyroidism    Glaucoma    Chronic venous insufficiency     Salem Caster. Fairly IV, PT, DPT Physical Therapist- [facility]  01/30/2022, 9:21 AM  [facility] [address] [place], [address] Phone: [phone]   Fax:  [phone]  Name: JOANN JORGE MRN: [id number] Date of Birth: Dec 09, 1935

## 2022-02-03 ENCOUNTER — Ambulatory Visit (INDEPENDENT_AMBULATORY_CARE_PROVIDER_SITE_OTHER): Payer: Medicare Other | Admitting: Psychology

## 2022-02-03 ENCOUNTER — Other Ambulatory Visit: Payer: Self-pay

## 2022-02-03 ENCOUNTER — Ambulatory Visit: Payer: Medicare Other

## 2022-02-03 DIAGNOSIS — G8929 Other chronic pain: Secondary | ICD-10-CM | POA: Diagnosis not present

## 2022-02-03 DIAGNOSIS — M6281 Muscle weakness (generalized): Secondary | ICD-10-CM | POA: Diagnosis not present

## 2022-02-03 DIAGNOSIS — F4323 Adjustment disorder with mixed anxiety and depressed mood: Secondary | ICD-10-CM

## 2022-02-03 DIAGNOSIS — I890 Lymphedema, not elsewhere classified: Secondary | ICD-10-CM | POA: Diagnosis not present

## 2022-02-03 DIAGNOSIS — R262 Difficulty in walking, not elsewhere classified: Secondary | ICD-10-CM | POA: Diagnosis not present

## 2022-02-03 DIAGNOSIS — M25562 Pain in left knee: Secondary | ICD-10-CM | POA: Diagnosis not present

## 2022-02-03 NOTE — Progress Notes (Signed)
Beth Rodgers Progress Note  Patient ID: Beth Rodgers, MRN: [id number]    Date: 02/03/22  Time Spent: 1:02 pm - 1:59 pm : 56 Minutes  Treatment Type: Individual Therapy.  Reported Symptoms: Rumination, anxiety.    Mental Status Exam: Appearance:  NA     Behavior: Appropriate  Motor: NA  Speech/Language:  Clear and Coherent and Normal Rate  Affect: Congruent  Mood: dysthymic  Thought process: normal  Thought content:   WNL  Sensory/Perceptual disturbances:   WNL  Orientation: oriented to person, place, time/date, and situation  Attention: Good  Concentration: Good  Memory: WNL  Fund of knowledge:  Good  Insight:   Good  Judgment:  Good  Impulse Control: Good   Risk Assessment: Danger to Self:  No Self-injurious Behavior: No Danger to Others: No Duty to Warn:no Physical Aggression / Violence:No  Access to Firearms a concern: No  Gang Involvement:No   Subjective:   Beth Rodgers participated from home, via phone, and consented to treatment. Therapist participated from office. We met online due to [place] pandemic. Beth Rodgers reviewed the events of the past two weeks. Beth Rodgers noted continued grief regarding Beth Rodgers, a person who aided her with her yard. She continues to process and grieve for her son's lack of functioning. We processed a recent event with her son, Beth Rodgers, who continues to be argumentative and antagonistic in the face of feedback and criticism. We continued to process this during the session and worked on identified boundaries for Beth Rodgers and Beth Rodgers to identify going forward. Therapist provided psycho-education regarding Bipolar. Therapist validated Beth Rodgers experiencing feelings during the session and provided supportive therapy.  Beth Rodgers continues to benefit from individual therapy and future appointments had been scheduled. Beth Rodgers discussed increased health issues and the need for additional medical treatment as a result.    Interventions:  Interpersonal  Diagnosis: Adjustment disorder with mixed anxiety and depressed mood  Plan: Patient is to use CBT, BA, Problem-solving, mindfulness and coping skills to help manage decrease symptoms associated with their diagnosis. (Target date: 08/16/22)   Long-term goal:   Reduce overall level, frequency, and intensity of the feelings of depression and anxiety evidenced by   decreased sadness, rumination, lethargy, and interpersonal stressors  from 6 to 7 days/week to 0 to 1 days/week per client report for at least 3 consecutive months.  Short-term goal:  Verbally express understanding of the relationship between feelings of depression, anxiety and their impact on thinking patterns and behaviors.  Verbalize an understanding of the role that distorted thinking plays in creating fears, excessive worry, and ruminations.  Engage in enjoyable activities on a consistent basis.   Beth Irish, LCSW

## 2022-02-03 NOTE — Therapy (Signed)
[facility] [address], [place], [address] Phone: [phone]   Fax:  [phone]  Physical Therapy Treatment  Patient Details  Name: Beth Rodgers MRN: [id number] Date of Birth: February 02, 1935 Referring Provider (PT): Owens Loffler, MD   Encounter Date: 02/03/2022   PT End of Session - 02/03/22 1554     Visit Number 2    Number of Visits 17    Date for PT Re-Evaluation 03/27/22    PT Start Time 8588    PT Stop Time 5027    PT Time Calculation (min) 40 min    Activity Tolerance Patient tolerated treatment well    Behavior During Therapy [facility] for tasks assessed/performed             Past Medical History:  Diagnosis Date   Basal cell carcinoma ([place])    txted with MOHs in past   Cellulitis 08/10/2018   Chronic venous insufficiency    with varicose veins   Family history of breast cancer    Family history of thyroid cancer    GERD (gastroesophageal reflux disease)    Glaucoma    History  of basal cell carcinoma    left nare/BREAST CANCER   History of chicken pox    Hypertension    Hypoglycemia    Hypothyroid    Osteoarthritis    back pain, L knee pain   Osteopenia 06/2009   DEXA 11/2015: T -2.3 hip, -2.2 spine, on longterm bisphosphonate/evista   Psoriasis    Pyogenic granuloma 04/16/2016    Past Surgical History:  Procedure Laterality Date   BASAL CELL CARCINOMA EXCISION     located on face   BREAST BIOPSY Right    core done ing [place]?   BREAST BIOPSY Left 08/16/2020   3 area bx 4:00 X, IMC, 6:00Q IMC, LN hydro #3-benign   BREAST LUMPECTOMY Left 09/11/2020   two areas of [facility]   CATARACT EXTRACTION     bilateral   DEXA  06/2009   T score Spine: -1.8, hip -2.0   EXCISION OF BREAST BIOPSY Left 09/11/2020   Procedure: EXCISION OF BREAST BIOPSY;  Surgeon: [name], MD;  Location: [facility] ORS;  Service: General;  Laterality: Left;   Foam sclerotherapy Bilateral 09/2015    Reed Pandy, [place]   MASTECTOMY W/ SENTINEL NODE BIOPSY Left 06/25/2021   Procedure: MASTECTOMY WITH SENTINEL LYMPH NODE BIOPSY;  Surgeon: [name], MD;  Location: [facility] ORS;  Service: General;  Laterality: Left;   NASAL RECONSTRUCTION  2005   for [place] L nare s/p Mohs   nuclear stress test  2014   normal in [place] BX Left 09/11/2020   Procedure: PARTIAL MASTECTOMY WITH NEEDLE LOCALIZATION AND AXILLARY SENTINEL LYMPH NODE [place];  Surgeon: [name], MD;  Location: [facility] ORS;  Service: General;  Laterality: Left;   RADIOFREQUENCY ABLATION Left 01/2012   remnant L G saphenous vein below knee   RADIOFREQUENCY ABLATION Right 02/2013   RLE vein ablation    VEIN LIGATION AND STRIPPING  remote   bilateral G saphenous veins    There were no vitals filed for this visit.   Subjective Assessment - 02/03/22 1554     Subjective Must not have placed her L knee well while she was sleeping and was in pain this morning. Was 2-3/10 L knee pain. Took celebrex which helped. 1/10 L knee pain currently.  Pertinent History Pt is a pleasant 87 y.o. female referred to OP PT for L knee pain. PMH includes: HTN, history of breast cancer, chronic venous insufficiency, history of basal cell carcinoma, GERD. Pt  reports having bad arthritis in L knee. Pain is worsened with quick movements, too much walking, maintaining knee in a certain position for too long, turning in her sleep hurts. Pain described as sharp and deep and is denying stiffness due to being active with HEP from previous therapy she just finished earlier this week. Pain is improved with consistent exercise and rest. Pt's goal is to improve pain and mobility in L knee and balance/walking tolerance for community ADL's.    Limitations Standing;Walking    Patient Stated Goals Self management of L knee pain and improving wlaking ADL's    Currently in Pain?  Yes    Pain Score 1     Pain Onset More than a month ago                                        PT Education - 02/03/22 1626     Education Details ther-ex    Person(s) Educated Patient    Methods Explanation;Demonstration;Tactile cues;Verbal cues    Comprehension Returned demonstration;Verbalized understanding               Objective     No latex band allergies   B varicose veins legs Osteopenia     L heel pain Hx of L mastectomy on June 25, 2021 and had home health PT for recovery.      Started using a SPC 5 months ago when ambulating by herself. Furniture walk at home.    Medbridge Access Code [email or web address]     Manual therapy   Seated STM L lateral hamstrings and IT band to decrease tension and improve L knee and ankle mechanics.      Therapeutic exercise   Seated manually resisted hip abductoin 10x2 with 5 seconds L   Seated clamshell yellow band, hips less than 90 degrees flexion 10x5 seconds for 3 sets  Seated hip adduction isometrics folded pillow squeeze 10x5 seconds  for 2 sets   Hooklying manually reisted hip abduction isometrics, leg straight              L 10x3 with 5 second holds    Hooklying hip extension leg straight to promote glute max strength             L 10x2 with 5 second holds      Improved exercise technique, movement at target joints, use of target muscles after min to mod verbal, visual, tactile cues.        Response to treatment Pt tolerated session well without aggravation of symptoms.         Clinical Impression  Worked on decreasing lateral hamstrings muscle tension and improving soft tissue mobility L lateral thigh as well as improving L glute med and max strength to promote better mechanics to her L knee joint during gait. Pt tolerated session well without aggravation of symptoms. Pt will benefit from continued skilled physical therapy services to decrease pain, improve strength and function.         PT Short Term Goals - 01/30/22 0858       PT SHORT TERM GOAL #1   Title Pt will be independent with HEP to improve L knee AROM  and global strength to improve walking tasks    Baseline 01/29/22: Initiated    Time 4    Period Weeks    Status New    Target Date 02/26/22               PT Long Term Goals - 01/30/22 0859       PT LONG TERM GOAL #1   Title Pt will improve knee FOTO to target score to display clinically significant improvement in functional mobility.    Baseline 01/29/22: Next session    Time 8    Period Weeks    Status New    Target Date 03/26/22      PT LONG TERM GOAL #2   Title Pt will improve L Knee extension by 5 deg or greater to demonstrate clinically significant improvement for improved gait mechanics.    Baseline 01/29/2022: L knee lacking 7 deg from TKE. R knee extension lacking 2 deg from TKE.    Time 8    Period Weeks    Status New    Target Date 03/26/22      PT LONG TERM GOAL #3   Title Pt will improve 2 min walk test to 27' for age matched norms to improve community ambulation tasks and reduced risk of falls.    Baseline 01/29/22: 316'    Time 8    Period Weeks    Status New    Target Date 03/26/22      PT LONG TERM GOAL #4   Title Pt will improve 5xSTS  to </= 12 sec to demonstrate clinically significant improvement in LE strength.    Baseline 01/29/22: 18.13 sec with UE use on arm rests    Time 8    Period Weeks    Status New    Target Date 03/26/22                   Plan - 02/03/22 1627     Clinical Impression Statement Worked on decreasing lateral hamstrings muscle tension and improving soft tissue mobility L lateral thigh as well as improving L glute med and max strength to promote better mechanics to her L knee joint during gait. Pt tolerated session well without aggravation of symptoms. Pt will benefit from continued skilled physical therapy services to decrease pain, improve strength and function.    Personal  Factors and Comorbidities Age;Comorbidity 3+;Time since onset of injury/illness/exacerbation;Fitness;Past/Current Experience    Comorbidities Osteopenia, chronic venous insufficiency,Hx of basal cell CA S/P lumpectomy, HTN    Examination-Activity Limitations Stand;Locomotion Level;Squat;Stairs;Carry;Lift;Bed Mobility;Transfers    Examination-Participation Restrictions Community Activity    Stability/Clinical Decision Making Stable/Uncomplicated    Rehab Potential Fair    Clinical Impairments Affecting Rehab Potential Chronicity of condition, age, fitness level    PT Frequency 2x / week    PT Duration 8 weeks    PT Treatment/Interventions Gait training;Functional mobility training;Therapeutic activities;Therapeutic exercise;Neuromuscular re-education;Patient/family education;Manual techniques;ADLs/Self Care Home Management;Aquatic Therapy;Cryotherapy;Electrical Stimulation;Moist Heat;Traction;Passive range of motion    PT Next Visit Plan Reassess HEP, address knee extension AROM limitations    PT Home Exercise Plan Access Code Council and Agree with Plan of Care Patient             Patient will benefit from skilled therapeutic intervention in order to improve the following deficits and impairments:  Postural dysfunction, Decreased strength, Improper body mechanics, Pain, Difficulty walking, Abnormal gait, Hypomobility, Decreased activity tolerance, Decreased mobility, Decreased range of motion  Visit Diagnosis: Chronic pain of left knee  Difficulty in walking, not elsewhere classified  Muscle weakness (generalized)     Problem List Patient Active Problem List   Diagnosis Date Noted   Venous ulcer of ankle, unspecified laterality ([place]) 12/24/2021   Bilateral lower extremity edema 12/17/2021   Acute respiratory infection 10/06/2021   Pre-op evaluation 06/02/2021   Right wrist pain 02/28/2021   Skin rash 02/28/2021   Ascending aorta dilatation ([place]) 01/10/2021    Left-sided chest pain 01/10/2021   Genetic testing 10/30/2020   Malignant neoplasm of overlapping sites of left breast in female, estrogen receptor positive ([place]) 10/22/2020   Family history of breast cancer    Family history of thyroid cancer    Insertional Achilles tendinopathy 08/28/2020   Breast cancer, left breast ([place]) 08/01/2020   Right carotid bruit 06/05/2020   Chest pressure 06/03/2020   Left Achilles tendinitis 09/01/2019   Low back pain 08/29/2019   Lumbosacral spondylosis without myelopathy 08/29/2019   Calcaneal spur 08/17/2019   Contusion of knee 10/04/2018   Varicose veins of bilateral lower extremities with pain 03/30/2018   Lymphedema 03/30/2018   Dizziness 02/02/2017   Situational depression 12/18/2016   Vitamin D deficiency 10/11/2016   Fatigue 08/12/2016   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 04/14/2016   Essential hypertension 12/17/2015   Dermatitis of external ear 12/17/2015   Corn of foot 05/16/2015   Advanced care planning/counseling discussion 10/30/2014   Neck pain on right side 06/25/2014   Primary osteoarthritis of left knee 08/17/2013   Oropharyngeal dysphagia 11/15/2012   Stressful life events affecting family and household 03/30/2012   Medicare annual wellness visit, subsequent 03/30/2012   History of basal cell carcinoma    Osteoarthritis    Osteopenia    Hypothyroidism    Glaucoma    Chronic venous insufficiency    Joneen Boers PT, DPT  02/03/2022, 6:33 PM  [facility] [address], [place], [address] Phone: [phone]   Fax:  [phone]  Name: Beth Rodgers MRN: [id number] Date of Birth: 05-16-1935

## 2022-02-04 ENCOUNTER — Ambulatory Visit: Payer: Medicare Other | Admitting: Occupational Therapy

## 2022-02-04 DIAGNOSIS — G8929 Other chronic pain: Secondary | ICD-10-CM | POA: Diagnosis not present

## 2022-02-04 DIAGNOSIS — R262 Difficulty in walking, not elsewhere classified: Secondary | ICD-10-CM | POA: Diagnosis not present

## 2022-02-04 DIAGNOSIS — M25562 Pain in left knee: Secondary | ICD-10-CM | POA: Diagnosis not present

## 2022-02-04 DIAGNOSIS — M6281 Muscle weakness (generalized): Secondary | ICD-10-CM | POA: Diagnosis not present

## 2022-02-04 DIAGNOSIS — M1712 Unilateral primary osteoarthritis, left knee: Secondary | ICD-10-CM | POA: Diagnosis not present

## 2022-02-04 DIAGNOSIS — I890 Lymphedema, not elsewhere classified: Secondary | ICD-10-CM

## 2022-02-04 NOTE — Therapy (Signed)
[facility] [address], [place], [address] Phone: [phone]   Fax:  [phone]  Occupational Therapy Treatment  Patient Details  Name: Beth Rodgers MRN: [id number] Date of Birth: 11/06/1935 Referring Provider (OT): C[place]ron   Encounter Date: 02/04/2022   OT End of Session - 02/04/22 1848     Visit Number 2    Number of Visits 4    Date for OT Re-Evaluation 03/31/22    OT Start Time 1400    OT Stop Time 1440    OT Time Calculation (min) 40 min    Activity Tolerance Patient tolerated treatment well    Behavior During Therapy [facility] for tasks assessed/performed             Past Medical History:  Diagnosis Date   Basal cell carcinoma ([place])    txted with MOHs in past   Cellulitis 08/10/2018   Chronic venous insufficiency    with varicose veins   Family history of breast cancer    Family history of thyroid cancer    GERD (gastroesophageal reflux disease)    Glaucoma    History  of basal cell carcinoma    left nare/BREAST CANCER   History of chicken pox    Hypertension    Hypoglycemia    Hypothyroid    Osteoarthritis    back pain, L knee pain   Osteopenia 06/2009   DEXA 11/2015: T -2.3 hip, -2.2 spine, on longterm bisphosphonate/evista   Psoriasis    Pyogenic granuloma 04/16/2016    Past Surgical History:  Procedure Laterality Date   BASAL CELL CARCINOMA EXCISION     located on face   BREAST BIOPSY Right    core done ing [place]?   BREAST BIOPSY Left 08/16/2020   3 area bx 4:00 X, IMC, 6:00Q IMC, LN hydro #3-benign   BREAST LUMPECTOMY Left 09/11/2020   two areas of [facility]   CATARACT EXTRACTION     bilateral   DEXA  06/2009   T score Spine: -1.8, hip -2.0   EXCISION OF BREAST BIOPSY Left 09/11/2020   Procedure: EXCISION OF BREAST BIOPSY;  Surgeon: [name], MD;  Location: [facility] ORS;  Service: General;  Laterality: Left;   Foam sclerotherapy Bilateral 09/2015    Reed P[place], [place]   MASTECTOMY W/ SENTINEL NODE BIOPSY Left 06/25/2021   Procedure: MASTECTOMY WITH SENTINEL LYMPH NODE BIOPSY;  Surgeon: [name], MD;  Location: [facility] ORS;  Service: General;  Laterality: Left;   NASAL RECONSTRUCTION  2005   for [place] L nare s/p Mohs   nuclear stress test  2014   normal in [place] BX Left 09/11/2020   Procedure: PARTIAL MASTECTOMY WITH NEEDLE LOCALIZATION AND AXILLARY SENTINEL LYMPH NODE [place];  Surgeon: [name], MD;  Location: [facility] ORS;  Service: General;  Laterality: Left;   RADIOFREQUENCY ABLATION Left 01/2012   remnant L G saphenous vein below knee   RADIOFREQUENCY ABLATION Right 02/2013   RLE vein ablation    VEIN LIGATION AND STRIPPING  remote   bilateral G saphenous veins    There were no vitals filed for this visit.   Subjective Assessment - 02/04/22 1846     Subjective  I just had shot in my knee- so I was doing this massage on my L arm and armpit- I do feel I am swollen or heavy under my arm , shoulder blade and arm -  I always go and read on the computer about what the Dr's and you all tell me  -I do think I need sleeve for my arm -but can I get it over the internet like the ones for my legs    Pertinent History seen DR Citron on 12/15/21 - s/p mastectomy 06/25/21 - Seroma of breast (N64.89)  -Patient concerned about recurrence of seroma of the left chest. The left chest wound is adequately healed. There is no fluid collection. I discussed with the patient that this one of the lower extremities is due to venous insufficiency and the swelling of the left chest is usually due to lymphatic insufficiency. These are 2 different things. I also discussed with patient that the swelling of the chest was not radiated to the lower extremity either. She felt more comfortable with the discussion. She will continue seeing vascular surgery for wound care.      PLAN:   1. The swelling of the chest is not related to the swelling of your legs.  2. Continue treatment of leg swelling by vascular service  - pt end of Dec refer to OT for lymphedema    Patient Stated Goals Want to know what to do for the lymphedema in my legs, under my breast and arm    Currently in Pain? No/denies                 LYMPHEDEMA/ONCOLOGY QUESTIONNAIRE - 02/04/22 0001       Left Upper Extremity Lymphedema   15 cm Proximal to Olecranon Process 31 cm    10 cm Proximal to Olecranon Process 29.2 cm    Olecranon Process 22.8 cm             Circumference measure - still now signs but pt report she feels heavy or full feeling in upper arm and chest /scapula Did some massage -but demo pt and review MLD - stimulate L axillary ln , then pump upper arm distal to proximal over shoulder ,inside of upper arm to outside arm and then repeat upper arm distal to prox over shoulder  8-10 reps each -daily  Light  Jovipak unilateral breast pad to wear under camisole , compression bra or binder - daily for few days and assess if feeling less full or congested  Wear then after that as needed  [facility] she can get it Over the counter compression sleeve for flying Review deep abdominal breathing with light compression at end - review multiple times  Pt to do 2 x day in supine  Pt to cont at home and follow up as needed in 4-6 wks                 OT Education - 02/04/22 1848     Education Details Lymphedema symptoms, treatmen and precautions    Person(s) Educated Patient    Methods Explanation;Demonstration;Tactile cues;Verbal cues;Handout    Comprehension Verbal cues required;Returned demonstration;Verbalized understanding                 OT Long Term Goals - 01/06/22 1817       OT LONG TERM GOAL #1   Title Pt to verbalize understanding of signs, symptoms , precautions of Lymphedema and wearing of preventitive sleeve    Baseline Pt read info on  lymphedema on computer - in need for educations on signs, symptoms, anatomy, management    Time 12    Period Weeks    Status New    Target Date  03/31/22                   Plan - 02/04/22 1849     Clinical Impression Statement Pt refer to OT with diagnosis of lymphedema. Pt had L mastectomy done June 22- and 2 ln removed , not radiation. Pt also present with bilateral below the knee compression stockings with zip that she wear daily and doing pump- has long standing vascular history, cellulitis and ulcer history. Pt was evaluated month ago and to cont with her same routine for LE lymphedema. This date she report that she do feel heaviness in her upper arm and swelling under her L arm and shoulder blade and is doing some massage daily for that. Pt was ed on correct MLD for L upper arm lymphedem, as well as use of jovipak unilateral breast pad to wear as needed under sports bra or binder. Review with pt Deep abdominal breathing with stimulation of deep abdominal ln - pt demo under standing and can follow up with me in 4-6 wks.  Pt do not show signs of lymphedema in L UE and chest but report heavy feeling - pt ed signs and symptoms - that her risk is low. Pt  can get L UE preventitive sleeve and gauntlet prior to flying to [place] or high risk activities. Pt feels better after session, more peace of mind and will cont at home to monitor - to follow up with me in month to 6 wks if needed.    OT Occupational Profile and History Problem Focused Assessment - Including review of records relating to presenting problem    Occupational performance deficits (Please refer to evaluation for details): ADL's;IADL's;Leisure    Body Structure / Function / Physical Skills Edema;ADL    Rehab Potential Good    Clinical Decision Making Limited treatment options, no task modification necessary    Comorbidities Affecting Occupational Performance: None    Modification or Assistance to Complete  Evaluation  No modification of tasks or assist necessary to complete eval    OT Frequency Monthly    OT Duration 8 weeks    OT Treatment/Interventions Self-care/ADL training;Patient/family education;Compression bandaging;Manual lymph drainage    Consulted and Agree with Plan of Care Patient             Patient will benefit from skilled therapeutic intervention in order to improve the following deficits and impairments:   Body Structure / Function / Physical Skills: Edema, ADL       Visit Diagnosis: Lymphedema, not elsewhere classified    Problem List Patient Active Problem List   Diagnosis Date Noted   Venous ulcer of ankle, unspecified laterality ([place]) 12/24/2021   Bilateral lower extremity edema 12/17/2021   Acute respiratory infection 10/06/2021   Pre-op evaluation 06/02/2021   Right wrist pain 02/28/2021   Skin rash 02/28/2021   Ascending aorta dilatation ([place]) 01/10/2021   Left-sided chest pain 01/10/2021   Genetic testing 10/30/2020   Malignant neoplasm of overlapping sites of left breast in female, estrogen receptor positive ([place]) 10/22/2020   Family history of breast cancer    Family history of thyroid cancer    Insertional Achilles tendinopathy 08/28/2020   Breast cancer, left breast ([place]) 08/01/2020   Right carotid bruit 06/05/2020   Chest pressure 06/03/2020   Left Achilles tendinitis 09/01/2019   Low back pain 08/29/2019   Lumbosacral spondylosis without myelopathy 08/29/2019   Calcaneal spur 08/17/2019   Contusion of knee 10/04/2018   Varicose veins  of bilateral lower extremities with pain 03/30/2018   Lymphedema 03/30/2018   Dizziness 02/02/2017   Situational depression 12/18/2016   Vitamin D deficiency 10/11/2016   Fatigue 08/12/2016   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 04/14/2016   Essential hypertension 12/17/2015   Dermatitis of external ear 12/17/2015   Corn of foot 05/16/2015   Advanced care planning/counseling  discussion 10/30/2014   Neck pain on right side 06/25/2014   Primary osteoarthritis of left knee 08/17/2013   Oropharyngeal dysphagia 11/15/2012   Stressful life events affecting family and household 03/30/2012   Medicare annual wellness visit, subsequent 03/30/2012   History of basal cell carcinoma    Osteoarthritis    Osteopenia    Hypothyroidism    Glaucoma    Chronic venous insufficiency     Rosalyn Gess, OTR/L,CLT 02/04/2022, 6:55 PM  [facility] [address], [place], [address] Phone: [phone]   Fax:  [phone]  Name: Beth Rodgers MRN: [id number] Date of Birth: Dec 22, 1935

## 2022-02-05 ENCOUNTER — Other Ambulatory Visit: Payer: Self-pay

## 2022-02-05 ENCOUNTER — Ambulatory Visit: Payer: Medicare Other

## 2022-02-05 DIAGNOSIS — G8929 Other chronic pain: Secondary | ICD-10-CM | POA: Diagnosis not present

## 2022-02-05 DIAGNOSIS — R262 Difficulty in walking, not elsewhere classified: Secondary | ICD-10-CM | POA: Diagnosis not present

## 2022-02-05 DIAGNOSIS — M25562 Pain in left knee: Secondary | ICD-10-CM | POA: Diagnosis not present

## 2022-02-05 DIAGNOSIS — I890 Lymphedema, not elsewhere classified: Secondary | ICD-10-CM | POA: Diagnosis not present

## 2022-02-05 DIAGNOSIS — M6281 Muscle weakness (generalized): Secondary | ICD-10-CM | POA: Diagnosis not present

## 2022-02-05 NOTE — Therapy (Signed)
[facility] [address], [place], [address] Phone: [phone]   Fax:  [phone]  Physical Therapy Treatment  Patient Details  Name: SUJEY Rodgers MRN: [id number] Date of Birth: 11-Sep-1935 Referring Provider (PT): Owens Loffler, MD   Encounter Date: 02/05/2022   PT End of Session - 02/05/22 1126     Visit Number 3    Number of Visits 17    Date for PT Re-Evaluation 03/27/22    PT Start Time 1018    PT Stop Time 1100    PT Time Calculation (min) 42 min    Activity Tolerance Patient tolerated treatment well    Behavior During Therapy [facility] for tasks assessed/performed             Past Medical History:  Diagnosis Date   Basal cell carcinoma ([place])    txted with MOHs in past   Cellulitis 08/10/2018   Chronic venous insufficiency    with varicose veins   Family history of breast cancer    Family history of thyroid cancer    GERD (gastroesophageal reflux disease)    Glaucoma    History  of basal cell carcinoma    left nare/BREAST CANCER   History of chicken pox    Hypertension    Hypoglycemia    Hypothyroid    Osteoarthritis    back pain, L knee pain   Osteopenia 06/2009   DEXA 11/2015: T -2.3 hip, -2.2 spine, on longterm bisphosphonate/evista   Psoriasis    Pyogenic granuloma 04/16/2016    Past Surgical History:  Procedure Laterality Date   BASAL CELL CARCINOMA EXCISION     located on face   BREAST BIOPSY Right    core done ing Section?   BREAST BIOPSY Left 08/16/2020   3 area bx 4:00 X, IMC, 6:00Q IMC, LN hydro #3-benign   BREAST LUMPECTOMY Left 09/11/2020   two areas of [facility]   CATARACT EXTRACTION     bilateral   DEXA  06/2009   T score Spine: -1.8, hip -2.0   EXCISION OF BREAST BIOPSY Left 09/11/2020   Procedure: EXCISION OF BREAST BIOPSY;  Surgeon: [name], MD;  Location: [facility] ORS;  Service: General;  Laterality: Left;   Foam sclerotherapy Bilateral 09/2015    [place], [place]   MASTECTOMY W/ SENTINEL NODE BIOPSY Left 06/25/2021   Procedure: MASTECTOMY WITH SENTINEL LYMPH NODE BIOPSY;  Surgeon: [name], MD;  Location: [facility] ORS;  Service: General;  Laterality: Left;   NASAL RECONSTRUCTION  2005   for [place] L nare s/p Mohs   nuclear stress test  2014   normal in [place] BX Left 09/11/2020   Procedure: PARTIAL MASTECTOMY WITH NEEDLE LOCALIZATION AND AXILLARY SENTINEL LYMPH NODE [place];  Surgeon: [name], MD;  Location: [facility] ORS;  Service: General;  Laterality: Left;   RADIOFREQUENCY ABLATION Left 01/2012   remnant L G saphenous vein below knee   RADIOFREQUENCY ABLATION Right 02/2013   RLE vein ablation    VEIN LIGATION AND STRIPPING  remote   bilateral G saphenous veins    There were no vitals filed for this visit.   Subjective Assessment - 02/05/22 1123     Subjective Pt reports knee soreness after L knee injection. No pain. Been compliant with HEP.    Pertinent History Pt is a pleasant 87 y.o. female referred to OP PT for L knee pain.  PMH includes: HTN, history of breast cancer, chronic venous insufficiency, history of basal cell carcinoma, GERD. Pt  reports having bad arthritis in L knee. Pain is worsened with quick movements, too much walking, maintaining knee in a certain position for too long, turning in her sleep hurts. Pain described as sharp and deep and is denying stiffness due to being active with HEP from previous therapy she just finished earlier this week. Pain is improved with consistent exercise and rest. Pt's goal is to improve pain and mobility in L knee and balance/walking tolerance for community ADL's.    Limitations Standing;Walking    Patient Stated Goals Self management of L knee pain and improving wlaking ADL's    Currently in Pain? No/denies    Pain Onset More than a month ago             There.ex:   Nu Step  L2 with UE/LE use. LE's set at a distance to promote improve L knee extension AROM. 6 min with SPM at 40.   Seated L hamstring stretch with belt: 3x40 sec holds. Mod VC's for form/technique with exercise  Hook lying:    L knee quad set with ice pack for pt comfort. Unable to tolerate towel under L ankle. Provided towel roll under knee for support: 2x20    L SLR with R knee in hook lying: 1x15, 1x8. Good form/technique    Standing hip flexion, abduction, extension with UE support: 2# AW's, 2x15 with flexion, 2x8 for abduction and extension. Mod multimodal cuing for form/technique with exercise to targeted musculature. Good carryover after cuing.     Mini squats with UE support: x10. Min VC's/TC's for improving hip hinge and preventing B knee valgus. Good carryover after cues.    PT Education - 02/05/22 1125     Education Details form/technique with exercise    Person(s) Educated Patient    Methods Explanation;Demonstration;Tactile cues;Verbal cues    Comprehension Verbalized understanding;Returned demonstration              PT Short Term Goals - 01/30/22 0858       PT SHORT TERM GOAL #1   Title Pt will be independent with HEP to improve L knee AROM and global strength to improve walking tasks    Baseline 01/29/22: Initiated    Time 4    Period Weeks    Status New    Target Date 02/26/22               PT Long Term Goals - 01/30/22 0859       PT LONG TERM GOAL #1   Title Pt will improve knee FOTO to target score to display clinically significant improvement in functional mobility.    Baseline 01/29/22: Next session    Time 8    Period Weeks    Status New    Target Date 03/26/22      PT LONG TERM GOAL #2   Title Pt will improve L Knee extension by 5 deg or greater to demonstrate clinically significant improvement for improved gait mechanics.    Baseline 01/29/2022: L knee lacking 7 deg from TKE. R knee extension lacking 2 deg from TKE.    Time 8    Period Weeks     Status New    Target Date 03/26/22      PT LONG TERM GOAL #3   Title Pt will improve 2 min walk test to 61' for age matched norms to improve community ambulation tasks  and reduced risk of falls.    Baseline 01/29/22: 316'    Time 8    Period Weeks    Status New    Target Date 03/26/22      PT LONG TERM GOAL #4   Title Pt will improve 5xSTS  to </= 12 sec to demonstrate clinically significant improvement in LE strength.    Baseline 01/29/22: 18.13 sec with UE use on arm rests    Time 8    Period Weeks    Status New    Target Date 03/26/22                   Plan - 02/05/22 1126     Clinical Impression Statement Session with focus on reassessment of new HEP. Limited to pt's tolerance due to soreness in L knee from injection. Use of ice during session with improvement in soreness. Pt required min multimodal cuing for mini squats for hip hing ebut otherwise has good understanding of there.ex. Pt will continue to benefit from skilled PT services to progress strength and mobility of LLE.    Personal Factors and Comorbidities Age;Comorbidity 3+;Time since onset of injury/illness/exacerbation;Fitness;Past/Current Experience    Comorbidities Osteopenia, chronic venous insufficiency,Hx of basal cell CA S/P lumpectomy, HTN    Examination-Activity Limitations Stand;Locomotion Level;Squat;Stairs;Carry;Lift;Bed Mobility;Transfers    Examination-Participation Restrictions Community Activity    Stability/Clinical Decision Making Stable/Uncomplicated    Rehab Potential Fair    Clinical Impairments Affecting Rehab Potential Chronicity of condition, age, fitness level    PT Frequency 2x / week    PT Duration 8 weeks    PT Treatment/Interventions Gait training;Functional mobility training;Therapeutic activities;Therapeutic exercise;Neuromuscular re-education;Patient/family education;Manual techniques;ADLs/Self Care Home Management;Aquatic Therapy;Cryotherapy;Electrical Stimulation;Moist  Heat;Traction;Passive range of motion    PT Next Visit Plan Reassess HEP, address knee extension AROM limitations    PT Home Exercise Plan Access Code [place] and Agree with Plan of Care Patient             Patient will benefit from skilled therapeutic intervention in order to improve the following deficits and impairments:  Postural dysfunction, Decreased strength, Improper body mechanics, Pain, Difficulty walking, Abnormal gait, Hypomobility, Decreased activity tolerance, Decreased mobility, Decreased range of motion  Visit Diagnosis: Chronic pain of left knee  Difficulty in walking, not elsewhere classified  Muscle weakness (generalized)     Problem List Patient Active Problem List   Diagnosis Date Noted   Venous ulcer of ankle, unspecified laterality ([place]) 12/24/2021   Bilateral lower extremity edema 12/17/2021   Acute respiratory infection 10/06/2021   Pre-op evaluation 06/02/2021   Right wrist pain 02/28/2021   Skin rash 02/28/2021   Ascending aorta dilatation ([place]) 01/10/2021   Left-sided chest pain 01/10/2021   Genetic testing 10/30/2020   Malignant neoplasm of overlapping sites of left breast in female, estrogen receptor positive ([place]) 10/22/2020   Family history of breast cancer    Family history of thyroid cancer    Insertional Achilles tendinopathy 08/28/2020   Breast cancer, left breast ([place]) 08/01/2020   Right carotid bruit 06/05/2020   Chest pressure 06/03/2020   Left Achilles tendinitis 09/01/2019   Low back pain 08/29/2019   Lumbosacral spondylosis without myelopathy 08/29/2019   Calcaneal spur 08/17/2019   Contusion of knee 10/04/2018   Varicose veins of bilateral lower extremities with pain 03/30/2018   Lymphedema 03/30/2018   Dizziness 02/02/2017   Situational depression 12/18/2016   Vitamin D deficiency 10/11/2016   Fatigue 08/12/2016   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 Fairly IV, PT, DPT Physical Therapist- [facility]  02/05/2022, 12:24 PM  [facility] [address] [place], [address] Phone: [phone]   Fax:  [phone]  Name: OSIRIS CHARLES MRN: [id number] Date of Birth: 03/12/1935

## 2022-02-06 ENCOUNTER — Encounter: Payer: Self-pay | Admitting: Family Medicine

## 2022-02-06 ENCOUNTER — Ambulatory Visit (INDEPENDENT_AMBULATORY_CARE_PROVIDER_SITE_OTHER): Payer: Medicare Other | Admitting: Family Medicine

## 2022-02-06 ENCOUNTER — Ambulatory Visit
Admission: RE | Admit: 2022-02-06 | Discharge: 2022-02-06 | Disposition: A | Discharge status: Home / Self Care | Payer: Medicare Other | Source: Ambulatory Visit | Attending: Family Medicine | Admitting: Family Medicine

## 2022-02-06 VITALS — BP 124/82 | HR 70 | Temp 97.8°F | Ht 66.0 in | Wt 171.4 lb

## 2022-02-06 DIAGNOSIS — Z01818 Encounter for other preprocedural examination: Secondary | ICD-10-CM

## 2022-02-06 DIAGNOSIS — E559 Vitamin D deficiency, unspecified: Secondary | ICD-10-CM | POA: Diagnosis not present

## 2022-02-06 DIAGNOSIS — R600 Localized edema: Secondary | ICD-10-CM

## 2022-02-06 DIAGNOSIS — E039 Hypothyroidism, unspecified: Secondary | ICD-10-CM

## 2022-02-06 DIAGNOSIS — I872 Venous insufficiency (chronic) (peripheral): Secondary | ICD-10-CM | POA: Diagnosis not present

## 2022-02-06 DIAGNOSIS — I890 Lymphedema, not elsewhere classified: Secondary | ICD-10-CM | POA: Diagnosis not present

## 2022-02-06 DIAGNOSIS — Z8639 Personal history of other endocrine, nutritional and metabolic disease: Secondary | ICD-10-CM | POA: Diagnosis not present

## 2022-02-06 DIAGNOSIS — M85852 Other specified disorders of bone density and structure, left thigh: Secondary | ICD-10-CM

## 2022-02-06 DIAGNOSIS — I10 Essential (primary) hypertension: Secondary | ICD-10-CM

## 2022-02-06 NOTE — Progress Notes (Signed)
Patient ID: Beth Rodgers, female    DOB: 01-02-1935, 87 y.o.   MRN: [id number]  This visit was conducted in person.  BP 124/82    Pulse 70    Temp 97.8 F (36.6 C) (Temporal)    Ht 5\' 6"  (1.676 m)    Wt 171 lb 6 oz (77.7 kg)    SpO2 98%    BMI 27.66 kg/m    CC: preop eval Subjective:   HPI: Beth Rodgers is a 87 y.o. female presenting on 02/06/2022 for Pre-op Exam (Scheduled for lt TKA on 03/09/22 by Dr. Gaynelle Arabian of [facility].  )   Beth Rodgers  has a past medical history of Basal cell carcinoma ([place]), Cellulitis (08/10/2018), Chronic venous insufficiency, Family history of breast cancer, Family history of thyroid cancer, GERD (gastroesophageal reflux disease), Glaucoma, History  of basal cell carcinoma, History of chicken pox, Hypertension, Hypoglycemia, Hypothyroid, Osteoarthritis, Osteopenia (06/2009), Psoriasis, and Pyogenic granuloma (04/16/2016).  Planned upcoming L total knee replacement - she actually decided to postpone surgery for now. Is deciding if she will have done in [place] or [place]. Discussed this visit may meet requirements for preop eval up to 6 months for future surgery per surgeon's discretion.   Patient has tolerated general anesthesia well in the past.  Latest surgical intervention was L breast mastectomy 05/2021.  Denies trouble with post-op nausea/vomiting, or trouble awakening after surgery.   Denies chest pain, dyspnea, palpitations, HA, dizziness.  No fevers/chills, coughing, abd pain, diarrhea or UTI symptoms.   Chronic leg swelling - she continues seeing OT for lymphedema. She is also regularly using pneumatic compression socks with benefit at night time. She is regularly using zippered compression stockings. She is regular doing bed exercises with benefit.      Relevant past medical, surgical, family and social history reviewed and updated as indicated. Interim medical history since our last visit reviewed. Allergies and medications  reviewed and updated. Outpatient Medications Prior to Visit  Medication Sig Dispense Refill   alendronate (FOSAMAX) 70 MG tablet Take 1 tablet (70 mg total) by mouth every Sunday. 12 tablet 3   Calcium Carb-Cholecalciferol (CALCIUM 600 + D PO) Take 1 tablet by mouth daily. Ca 500 + Vit D 800     calcium carbonate (TUMS EX) 750 MG chewable tablet Chew 750 mg by mouth daily as needed for heartburn.     celecoxib (CELEBREX) 200 MG capsule Take 200 mg by mouth daily.     Ciclopirox 0.77 % gel Apply 1 application topically 2 (two) times daily as needed (fungus).     clobetasol cream (TEMOVATE) 4.65 % Apply 1 application topically as directed. Qd to bid up to 5 days a week to aa psoriasis on body until clear, then prn flares, avoid face, groin, axilla (Patient taking differently: Apply 1 application topically 2 (two) times daily.) 60 g 2   diclofenac sodium (VOLTAREN) 1 % GEL Apply 1 application topically 3 (three) times daily. (Patient taking differently: Apply 1 application topically 3 (three) times daily as needed (pain).) 1 Tube 0   latanoprost (XALATAN) 0.005 % ophthalmic solution latanoprost 0.005 % eye drops  PLACE 1 DROP INTO BOTH EYES ONCE EVERY EVENING     levothyroxine (SYNTHROID) 75 MCG tablet Take 1 tablet (75 mcg total) by mouth daily. 90 tablet 3   losartan (COZAAR) 100 MG tablet TAKE 1 TABLET BY MOUTH EVERY DAY 90 tablet 3   Magnesium 400 MG TABS Take 400 mg by mouth  daily.     Multiple Vitamin (MULTIVITAMIN) tablet Take 1 tablet by mouth daily.     Roflumilast (ZORYVE) 0.3 % CREA Apply topically every other day. Sample     SELENIUM PO Take by mouth daily.     silver sulfADIAZINE (SILVADENE) 1 % cream Apply 1 application topically daily. 50 g 4   tacrolimus (PROTOPIC) 0.1 % ointment APPLY TOPICALLY TO AFFECTED AREA(S) TWICE DAILY 30 g 0   vitamin C (ASCORBIC ACID) 500 MG tablet Take 500 mg by mouth daily.     vitamin E 180 MG (400 UNITS) capsule Take 400 Units by mouth daily.      cephALEXin (KEFLEX) 500 MG capsule Take 1 capsule (500 mg total) by mouth 4 (four) times daily. (Patient not taking: Reported on 01/12/2022) 60 capsule 1   No facility-administered medications prior to visit.     Per HPI unless specifically indicated in ROS section below Review of Systems  Objective:  BP 124/82    Pulse 70    Temp 97.8 F (36.6 C) (Temporal)    Ht 5\' 6"  (1.676 m)    Wt 171 lb 6 oz (77.7 kg)    SpO2 98%    BMI 27.66 kg/m   Wt Readings from Last 3 Encounters:  02/06/22 171 lb 6 oz (77.7 kg)  01/12/22 169 lb 9.6 oz (76.9 kg)  12/26/21 168 lb 6.4 oz (76.4 kg)      Physical Exam Vitals and nursing note reviewed.  Constitutional:      Appearance: Normal appearance. She is not ill-appearing.  HENT:     Head: Normocephalic and atraumatic.  Eyes:     Extraocular Movements: Extraocular movements intact.     Conjunctiva/sclera: Conjunctivae normal.     Pupils: Pupils are equal, round, and reactive to light.  Cardiovascular:     Rate and Rhythm: Normal rate and regular rhythm.     Pulses: Normal pulses.     Heart sounds: Normal heart sounds. No murmur heard. Pulmonary:     Effort: Pulmonary effort is normal. No respiratory distress.     Breath sounds: Normal breath sounds. No wheezing, rhonchi or rales.  Musculoskeletal:     Right lower leg: No edema.     Left lower leg: No edema.     Comments:  Zippered compression stockings in place Lipodermatosclerosis changes bilaterally  Skin:    General: Skin is warm and dry.     Findings: No rash.  Neurological:     Mental Status: She is alert.  Psychiatric:        Mood and Affect: Mood normal.        Behavior: Behavior normal.       EKG - sinus bradycardia rate 60s, normal axis, intervals, no hypertrophy or acute ST/T changes   Assessment & Plan:  This visit occurred during the SARS-CoV-2 public health emergency.  Safety protocols were in place, including screening questions prior to the visit, additional usage of  staff PPE, and extensive cleaning of exam room while observing appropriate contact time as indicated for disinfecting solutions.   Problem List Items Addressed This Visit     Osteopenia   Relevant Orders   VITAMIN D 25 Hydroxy (Vit-D Deficiency, Fractures)   Hypothyroidism    Update TSH on levothyroxine 52mcg daily when she returns for labs.       Relevant Orders   TSH   Chronic venous insufficiency    Chronic, followed by [facility], managing with compression stockings, now seeing OT  lymphedema specialist.       Essential hypertension    Chronic, stable on current regimen.       Vitamin D deficiency    Update vitamin D levels on cal/vit d replacement.       Lymphedema    She feels benefit with management strategies learned through OT - will continue this.       Pre-op evaluation - Primary    RCRI = 0 Anticipate adequately low risk to proceed with upcoming knee replacement surgery.  As she's decided to postpone surgery for now, she will call us 1 month prior to when she decides to schedule surgery, to come in for CXR and labs.  Labs ordered today.       Relevant Orders   EKG 12-Lead (Completed)   Comprehensive metabolic panel   CBC with Differential/Platelet   TSH   Hemoglobin A1c   Urinalysis, Routine w reflex microscopic   DG Chest 2 View   Bilateral lower extremity edema   Other Visit Diagnoses     Personal history of other endocrine, nutritional and metabolic disease       Relevant Orders   Hemoglobin A1c        No orders of the defined types were placed in this encounter.  Orders Placed This Encounter  Procedures   DG Chest 2 View    Standing Status:   Future    Number of Occurrences:   1    Standing Expiration Date:   02/06/2023    Order Specific Question:   Reason for Exam (SYMPTOM  OR DIAGNOSIS REQUIRED)    Answer:   preop eval    Order Specific Question:   Preferred imaging location?    Answer:   [facility]   Comprehensive metabolic panel     Standing Status:   Future    Standing Expiration Date:   02/06/2023   CBC with Differential/Platelet    Standing Status:   Future    Standing Expiration Date:   02/06/2023   TSH    Standing Status:   Future    Standing Expiration Date:   02/06/2023   Hemoglobin A1c    Standing Status:   Future    Standing Expiration Date:   02/06/2023   VITAMIN D 25 Hydroxy (Vit-D Deficiency, Fractures)    Standing Status:   Future    Standing Expiration Date:   02/06/2023   Urinalysis, Routine w reflex microscopic    Standing Status:   Future    Standing Expiration Date:   02/06/2023   EKG 12-Lead     Patient Instructions  EKG today.  Return for labs and urinalysis and xray 1 month prior to planned surgery.   Follow up plan: Return if symptoms worsen or fail to improve.  Ria Bush, MD

## 2022-02-06 NOTE — Patient Instructions (Addendum)
EKG today.  Return for labs and urinalysis and xray 1 month prior to planned surgery.

## 2022-02-07 NOTE — Assessment & Plan Note (Signed)
Chronic, stable on current regimen.  

## 2022-02-07 NOTE — Assessment & Plan Note (Signed)
Chronic, followed by [facility], managing with compression stockings, now seeing OT lymphedema specialist.

## 2022-02-07 NOTE — Assessment & Plan Note (Signed)
She feels benefit with management strategies learned through OT - will continue this.

## 2022-02-07 NOTE — Assessment & Plan Note (Signed)
Update TSH on levothyroxine 71mcg daily when she returns for labs.

## 2022-02-07 NOTE — Assessment & Plan Note (Signed)
Update vitamin D levels on cal/vit d replacement.

## 2022-02-07 NOTE — Assessment & Plan Note (Deleted)
Update vitamin D levels on cal/vit d replacement.

## 2022-02-07 NOTE — Assessment & Plan Note (Signed)
RCRI = 0 Anticipate adequately low risk to proceed with upcoming knee replacement surgery.  As she's decided to postpone surgery for now, she will call us 1 month prior to when she decides to schedule surgery, to come in for CXR and labs.  Labs ordered today.

## 2022-02-10 ENCOUNTER — Ambulatory Visit: Payer: Medicare Other

## 2022-02-10 ENCOUNTER — Other Ambulatory Visit: Payer: Self-pay

## 2022-02-10 ENCOUNTER — Telehealth (INDEPENDENT_AMBULATORY_CARE_PROVIDER_SITE_OTHER): Payer: Self-pay

## 2022-02-10 DIAGNOSIS — I890 Lymphedema, not elsewhere classified: Secondary | ICD-10-CM | POA: Diagnosis not present

## 2022-02-10 DIAGNOSIS — R262 Difficulty in walking, not elsewhere classified: Secondary | ICD-10-CM

## 2022-02-10 DIAGNOSIS — M25562 Pain in left knee: Secondary | ICD-10-CM | POA: Diagnosis not present

## 2022-02-10 DIAGNOSIS — G8929 Other chronic pain: Secondary | ICD-10-CM | POA: Diagnosis not present

## 2022-02-10 DIAGNOSIS — M6281 Muscle weakness (generalized): Secondary | ICD-10-CM | POA: Diagnosis not present

## 2022-02-10 NOTE — Telephone Encounter (Signed)
The pt called and left a VM on the nurses line saying that she is having pain in her legs  that's constant she states she is using compression pumps and stockings and elevating she wants to know should she come in. Please advise.

## 2022-02-10 NOTE — Therapy (Signed)
[facility] [address], [place], [address] Phone: [phone]   Fax:  [phone]  Physical Therapy Treatment  Patient Details  Name: Beth Rodgers MRN: [id number] Date of Birth: Nov 08, 1935 Referring Provider (PT): Tamala Julian, [place]   Encounter Date: 02/10/2022   PT End of Session - 02/10/22 1336     Visit Number 4    Number of Visits 17    Date for PT Re-Evaluation 03/27/22    PT Start Time 5364    PT Stop Time 1416    PT Time Calculation (min) 41 min    Activity Tolerance Patient tolerated treatment well    Behavior During Therapy [facility] for tasks assessed/performed             Past Medical History:  Diagnosis Date   Basal cell carcinoma ([name])    txted with MOHs in past   Cellulitis 08/10/2018   Chronic venous insufficiency    with varicose veins   Family history of breast cancer    Family history of thyroid cancer    GERD (gastroesophageal reflux disease)    Glaucoma    History  of basal cell carcinoma    left nare/BREAST CANCER   History of chicken pox    Hypertension    Hypoglycemia    Hypothyroid    Osteoarthritis    back pain, L knee pain   Osteopenia 06/2009   DEXA 11/2015: T -2.3 hip, -2.2 spine, on longterm bisphosphonate/evista   Psoriasis    Pyogenic granuloma 04/16/2016    Past Surgical History:  Procedure Laterality Date   BASAL CELL CARCINOMA EXCISION     located on face   BREAST BIOPSY Right    core done ing [place]?   BREAST BIOPSY Left 08/16/2020   3 area bx 4:00 X, IMC, 6:00Q IMC, LN hydro #3-benign   BREAST LUMPECTOMY Left 09/11/2020   two areas of [facility]   CATARACT EXTRACTION     bilateral   DEXA  06/2009   T score Spine: -1.8, hip -2.0   EXCISION OF BREAST BIOPSY Left 09/11/2020   Procedure: EXCISION OF BREAST BIOPSY;  Surgeon: [name], MD;  Location: [facility] ORS;  Service: General;  Laterality: Left;   Foam sclerotherapy Bilateral 09/2015    Reed [place], [place]   MASTECTOMY W/ SENTINEL NODE BIOPSY Left 06/25/2021   Procedure: MASTECTOMY WITH SENTINEL LYMPH NODE BIOPSY;  Surgeon: [name], MD;  Location: [facility] ORS;  Service: General;  Laterality: Left;   NASAL RECONSTRUCTION  2005   for [place] L nare s/p Mohs   nuclear stress test  2014   normal in [place] BX Left 09/11/2020   Procedure: PARTIAL MASTECTOMY WITH NEEDLE LOCALIZATION AND AXILLARY SENTINEL LYMPH NODE [place];  Surgeon: [name], MD;  Location: [facility] ORS;  Service: General;  Laterality: Left;   RADIOFREQUENCY ABLATION Left 01/2012   remnant L G saphenous vein below knee   RADIOFREQUENCY ABLATION Right 02/2013   RLE vein ablation    VEIN LIGATION AND STRIPPING  remote   bilateral G saphenous veins    There were no vitals filed for this visit.   Subjective Assessment - 02/10/22 1336     Subjective Was able to walk from the car to the clinic without using her Cel[place]. No L knee pain currently. The circulation in both legs bother her.    Pertinent History Pt is a  pleasant 87 y.o. female referred to OP PT for L knee pain. PMH includes: HTN, history of breast cancer, chronic venous insufficiency, history of basal cell carcinoma, GERD. Pt  reports having bad arthritis in L knee. Pain is worsened with quick movements, too much walking, maintaining knee in a certain position for too long, turning in her sleep hurts. Pain described as sharp and deep and is denying stiffness due to being active with HEP from previous therapy she just finished earlier this week. Pain is improved with consistent exercise and rest. Pt's goal is to improve pain and mobility in L knee and balance/walking tolerance for community ADL's.    Limitations Standing;Walking    Patient Stated Goals Self management of L knee pain and improving wlaking ADL's    Currently in Pain? No/denies    Pain Onset More than a  month ago                [facility] PT Assessment - 02/10/22 1426       Assessment   Referring Provider (PT) Tamala Julian, [place]                                    PT Education - 02/10/22 1353     Education Details ther-ex    Person(s) Educated Patient    Methods Explanation;Demonstration;Tactile cues;Verbal cues    Comprehension Returned demonstration;Verbalized understanding            Objective     No latex band allergies   B varicose veins legs Osteopenia     L heel pain Hx of L mastectomy on June 25, 2021 and had home health PT for recovery.      Started using a SPC 5 months ago when ambulating by herself. Furniture walk at home.    Medbridge Access Code [email or web address]      Therapeutic exercise   Standing B UE assist   Hip abduction 2 lbs ankle weight   R 10x3   L 10x3  Standing mini squat with UE assist PRN 7x. Emphasis on decreasing L knee valgus.   Standing B UE assist   Hip extension 2 lbs ankle weight   R 10x2   L 10x2  LAQ 2 lbs each ankle  R 5x3  L 5x3  Side stepping at table with B UE assist 5 ft to the R and 5 ft to the L 4x to promote glute med strength   Seated hip adduction isometrics folded pillow squeeze 10x5 seconds   Hooklying hip extension leg straight to promote glute max strength             L 10x2 with 5 second holds   Hooklying manually reisted hip abduction isometrics, leg straight              L 10x2 with 5 second holds          Improved exercise technique, movement at target joints, use of target muscles after min to mod verbal, visual, tactile cues.        Response to treatment Pt tolerated session well without aggravation of symptoms.          Clinical Impression Worked on improving B glute med, max and quad strength to promote better L knee mechanics during closed chain tasks. Good muscle work felt with exercises. Pt tolerated session well without aggravation of symptoms.  Pt will  benefit from continued  skilled physical therapy services to decrease pain, improve strength and function.       PT Short Term Goals - 01/30/22 0858       PT SHORT TERM GOAL #1   Title Pt will be independent with HEP to improve L knee AROM and global strength to improve walking tasks    Baseline 01/29/22: Initiated    Time 4    Period Weeks    Status New    Target Date 02/26/22               PT Long Term Goals - 01/30/22 0859       PT LONG TERM GOAL #1   Title Pt will improve knee FOTO to target score to display clinically significant improvement in functional mobility.    Baseline 01/29/22: Next session    Time 8    Period Weeks    Status New    Target Date 03/26/22      PT LONG TERM GOAL #2   Title Pt will improve L Knee extension by 5 deg or greater to demonstrate clinically significant improvement for improved gait mechanics.    Baseline 01/29/2022: L knee lacking 7 deg from TKE. R knee extension lacking 2 deg from TKE.    Time 8    Period Weeks    Status New    Target Date 03/26/22      PT LONG TERM GOAL #3   Title Pt will improve 2 min walk test to 53' for age matched norms to improve community ambulation tasks and reduced risk of falls.    Baseline 01/29/22: 316'    Time 8    Period Weeks    Status New    Target Date 03/26/22      PT LONG TERM GOAL #4   Title Pt will improve 5xSTS  to </= 12 sec to demonstrate clinically significant improvement in LE strength.    Baseline 01/29/22: 18.13 sec with UE use on arm rests    Time 8    Period Weeks    Status New    Target Date 03/26/22                   Plan - 02/10/22 1353     Clinical Impression Statement Worked on improving B glute med, max and quad strength to promote better L knee mechanics during closed chain tasks. Good muscle work felt with exercises. Pt tolerated session well without aggravation of symptoms.  Pt will benefit from continued skilled physical therapy services to decrease pain,  improve strength and function.    Personal Factors and Comorbidities Age;Comorbidity 3+;Time since onset of injury/illness/exacerbation;Fitness;Past/Current Experience    Comorbidities Osteopenia, chronic venous insufficiency,Hx of basal cell CA S/P lumpectomy, HTN    Examination-Activity Limitations Stand;Locomotion Level;Squat;Stairs;Carry;Lift;Bed Mobility;Transfers    Examination-Participation Restrictions Community Activity    Stability/Clinical Decision Making Stable/Uncomplicated    Clinical Decision Making Low    Rehab Potential Fair    Clinical Impairments Affecting Rehab Potential Chronicity of condition, age, fitness level    PT Frequency 2x / week    PT Duration 8 weeks    PT Treatment/Interventions Gait training;Functional mobility training;Therapeutic activities;Therapeutic exercise;Neuromuscular re-education;Patient/family education;Manual techniques;ADLs/Self Care Home Management;Aquatic Therapy;Cryotherapy;Electrical Stimulation;Moist Heat;Traction;Passive range of motion    PT Next Visit Plan Reassess HEP, address knee extension AROM limitations    PT Home Exercise Plan Access Code [place] and Agree with Plan of Care Patient  Patient will benefit from skilled therapeutic intervention in order to improve the following deficits and impairments:  Postural dysfunction, Decreased strength, Improper body mechanics, Pain, Difficulty walking, Abnormal gait, Hypomobility, Decreased activity tolerance, Decreased mobility, Decreased range of motion  Visit Diagnosis: Chronic pain of left knee  Difficulty in walking, not elsewhere classified     Problem List Patient Active Problem List   Diagnosis Date Noted   Venous ulcer of ankle, unspecified laterality ([place]) 12/24/2021   Bilateral lower extremity edema 12/17/2021   Pre-op evaluation 06/02/2021   Right wrist pain 02/28/2021   Skin rash 02/28/2021   Ascending aorta dilatation ([place]) 01/10/2021    Left-sided chest pain 01/10/2021   Genetic testing 10/30/2020   Malignant neoplasm of overlapping sites of left breast in female, estrogen receptor positive ([place]) 10/22/2020   Family history of breast cancer    Family history of thyroid cancer    Insertional Achilles tendinopathy 08/28/2020   Breast cancer, left breast ([place]) 08/01/2020   Right carotid bruit 06/05/2020   Chest pressure 06/03/2020   Left Achilles tendinitis 09/01/2019   Low back pain 08/29/2019   Lumbosacral spondylosis without myelopathy 08/29/2019   Calcaneal spur 08/17/2019   Contusion of knee 10/04/2018   Varicose veins of bilateral lower extremities with pain 03/30/2018   Lymphedema 03/30/2018   Dizziness 02/02/2017   Situational depression 12/18/2016   Vitamin D deficiency 10/11/2016   Fatigue 08/12/2016   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 04/14/2016   Essential hypertension 12/17/2015   Dermatitis of external ear 12/17/2015   Corn of foot 05/16/2015   Advanced care planning/counseling discussion 10/30/2014   Neck pain on right side 06/25/2014   Primary osteoarthritis of left knee 08/17/2013   Oropharyngeal dysphagia 11/15/2012   Stressful life events affecting family and household 03/30/2012   Medicare annual wellness visit, subsequent 03/30/2012   History of basal cell carcinoma    Osteoarthritis    Osteopenia    Hypothyroidism    Glaucoma    Chronic venous insufficiency    Joneen Boers PT, DPT  02/10/2022, 2:28 PM  [facility] [address], [place], [address] Phone: [phone]   Fax:  [phone]  Name: Beth Rodgers MRN: [id number] Date of Birth: 02-22-1935

## 2022-02-10 NOTE — Telephone Encounter (Signed)
Her leg swelling should not cause constant pain.  She should see her pcp

## 2022-02-11 ENCOUNTER — Ambulatory Visit (INDEPENDENT_AMBULATORY_CARE_PROVIDER_SITE_OTHER): Payer: Medicare Other | Admitting: Dermatology

## 2022-02-11 DIAGNOSIS — I8311 Varicose veins of right lower extremity with inflammation: Secondary | ICD-10-CM

## 2022-02-11 DIAGNOSIS — I8312 Varicose veins of left lower extremity with inflammation: Secondary | ICD-10-CM

## 2022-02-11 DIAGNOSIS — L409 Psoriasis, unspecified: Secondary | ICD-10-CM

## 2022-02-11 DIAGNOSIS — I872 Venous insufficiency (chronic) (peripheral): Secondary | ICD-10-CM | POA: Diagnosis not present

## 2022-02-11 NOTE — Patient Instructions (Addendum)
Stasis in the legs causes chronic leg swelling, which may result in itchy or painful rashes, skin discoloration, skin texture changes, and sometimes ulceration.  Recommend daily graduated compression hose/stockings- easiest to put on first thing in morning, remove at bedtime.  Elevate legs as much as possible. Avoid salt/sodium rich foods.  Start AmLactin Cream to legs daily. Samples of lotion given today. May also use to areas of psoriasis. Continue compression daily in AM Continue Zoryve Cream - spot treat affected areas once to twice daily as needed.  Psoriasis is a chronic non-curable, but treatable genetic/hereditary disease that may have other systemic features affecting other organ systems such as joints (Psoriatic Arthritis). It is associated with an increased risk of inflammatory bowel disease, heart disease, non-alcoholic fatty liver disease, and depression.    Continue Zoryve Cream - spot treat affected areas once to twice daily as needed.  If You Need Anything After Your Visit  If you have any questions or concerns for your doctor, please call our main line at [phone] and press option 4 to reach your doctor's medical assistant. If no one answers, please leave a voicemail as directed and we will return your call as soon as possible. Messages left after 4 pm will be answered the following business day.   You may also send [place] a message via [place]. We typically respond to MyChart messages within 1-2 business days.  For prescription refills, please ask your pharmacy to contact our office. Our fax number is [phone].  If you have an urgent issue when the clinic is closed that cannot wait until the next business day, you can page your doctor at the number below.    Please note that while we do our best to be available for urgent issues outside of office hours, we are not available 24/7.   If you have an urgent issue and are unable to reach [place], you may choose to seek medical care  at your doctor's office, retail clinic, urgent care center, or emergency room.  If you have a medical emergency, please immediately call 911 or go to the emergency department.  Pager Numbers  - Dr. Nehemiah Massed: [phone]  - Dr. Laurence Ferrari: [phone]  - Dr. Nicole Kindred: [phone]  In the event of inclement weather, please call our main line at [phone] for an update on the status of any delays or closures.  Dermatology Medication Tips: Please keep the boxes that topical medications come in in order to help keep track of the instructions about where and how to use these. Pharmacies typically print the medication instructions only on the boxes and not directly on the medication tubes.   If your medication is too expensive, please contact our office at [phone] option 4 or send [place] a message through [place].   We are unable to tell what your co-pay for medications will be in advance as this is different depending on your insurance coverage. However, we may be able to find a substitute medication at lower cost or fill out paperwork to get insurance to cover a needed medication.   If a prior authorization is required to get your medication covered by your insurance company, please allow [place] 1-2 business days to complete this process.  Drug prices often vary depending on where the prescription is filled and some pharmacies may offer cheaper prices.  The website www.goodrx.com contains coupons for medications through different pharmacies. The prices here do not account for what the cost may be with help from insurance (it may  be cheaper with your insurance), but the website can give you the price if you did not use any insurance.  - You can print the associated coupon and take it with your prescription to the pharmacy.  - You may also stop by our office during regular business hours and pick up a GoodRx coupon card.  - If you need your prescription sent electronically to a different pharmacy,  notify our office through [facility] or by phone at [phone] option 4.     Si Usted Necesita Algo Despus de Su Visita  Tambin puede enviarnos un mensaje a travs de Pharmacist, community. Por lo general respondemos a los mensajes de MyChart en el transcurso de 1 a 2 das hbiles.  Para renovar recetas, por favor pida a su farmacia que se ponga en contacto con nuestra oficina. Harland Dingwall de fax es [place] [phone].  Si tiene un asunto urgente cuando la clnica est cerrada y que no puede esperar hasta el siguiente da hbil, puede llamar/localizar a su doctor(a) al nmero que aparece a continuacin.   Por favor, tenga en cuenta que aunque hacemos todo lo posible para estar disponibles para asuntos urgentes fuera del horario de [place], no estamos disponibles las 24 horas del da, los 7 das de la [place].   Si tiene un problema urgente y no puede comunicarse con nosotros, puede optar por buscar atencin mdica  en el consultorio de su doctor(a), en una clnica privada, en un centro de atencin urgente o en una sala de emergencias.  Si tiene Engineering geologist, por favor llame inmediatamente al 911 o vaya a la sala de emergencias.  Nmeros de bper  - Dr. Nehemiah Massed: [phone]  - Dra. Moye: [phone]  - Dra. Nicole Kindred: [phone]  En caso de inclemencias del [place], por favor llame a Johnsie Kindred principal al [phone] para una actualizacin sobre el [place] de cualquier retraso o cierre.  Consejos para la medicacin en dermatologa: Por favor, guarde las cajas en las que vienen los medicamentos de uso tpico para ayudarle a seguir las instrucciones sobre dnde y cmo usarlos. Las farmacias generalmente imprimen las instrucciones del medicamento slo en las cajas y no directamente en los tubos del [place].   Si su medicamento es muy caro, por favor, pngase en contacto con Zigmund Daniel llamando al [phone] y presione la opcin 4 o envenos un mensaje a travs de  Pharmacist, community.   No podemos decirle cul ser su copago por los medicamentos por adelantado ya que esto es diferente dependiendo de la cobertura de su seguro. Sin embargo, es posible que podamos encontrar un medicamento sustituto a Electrical engineer un formulario para que el seguro cubra el medicamento que se considera necesario.   Si se requiere una autorizacin previa para que su compaa de seguros [place] su medicamento, por favor permtanos de 1 a 2 das hbiles para completar este proceso.  Los precios de los medicamentos varan con frecuencia dependiendo del Environmental consultant de dnde se surte la receta y alguna farmacias pueden ofrecer precios ms baratos.  El sitio web www.goodrx.com tiene cupones para medicamentos de Airline pilot. Los precios aqu no tienen en cuenta lo que podra costar con la ayuda del seguro (puede ser ms barato con su seguro), pero el sitio web puede darle el precio si no utiliz Research scientist (physical sciences).  - Puede imprimir el cupn correspondiente y llevarlo con su receta a la farmacia.  - Tambin puede pasar por nuestra oficina durante el horario de atencin regular y  recoger una tarjeta de cupones de GoodRx.  - Si necesita que su receta se enve electrnicamente a una farmacia diferente, informe a nuestra oficina a travs de MyChart de [place] o por telfono llamando al [phone] y presione la opcin 4.

## 2022-02-11 NOTE — Progress Notes (Signed)
Follow-Up Visit   Subjective  Beth Rodgers is a 87 y.o. female who presents for the following: Follow-up.  Patient is here for 1 month follow-up stasis dermatitis with lipodermatosclerosis of the lower legs and psoriasis of the arms and back. She is using trade sample of Zoryve Cream very sparingly and not every day due to side effects. She uses CeraVe Itch Relief and CeraVe Healing Ointment daily. On legs, she uses lymphedema pumps for one hour daily and compression.   The following portions of the chart were reviewed this encounter and updated as appropriate:       Review of Systems:  No other skin or systemic complaints except as noted in HPI or Assessment and Plan.  Objective  Well appearing patient in no apparent distress; mood and affect are within normal limits.  A focused examination was performed including legs, arms. Relevant physical exam findings are noted in the Assessment and Plan.  arms, back Pink scaly patches on left lower leg, arms, left upper back, spinal lower back  lower legs Hyperpigmented woody induration of the bilateral lower legs and ankles with pink scaly patch on right medial ankle    Assessment & Plan  Psoriasis arms, back  Chronic and persistent condition with duration or expected duration over one year. Condition is symptomatic/ bothersome to patient. Not currently at goal.   Psoriasis is a chronic non-curable, but treatable genetic/hereditary disease that may have other systemic features affecting other organ systems such as joints (Psoriatic Arthritis). It is associated with an increased risk of inflammatory bowel disease, heart disease, non-alcoholic fatty liver disease, and depression.    Patient does not like to use topical steroids much due to side effect of skin atrophy. Pt concerned about potential side effects from Zoryve.  Discussed side effects were observed in patients taking oral form and not seen in topical version.  Continue  Zoryve Cream Apply to AA once daily until improved. Pt has trade sample.    Continue CeraVe Itch Relief moisturizer, as needed.     Related Medications clobetasol cream (TEMOVATE) 5.73 % Apply 1 application topically as directed. Qd to bid up to 5 days a week to aa psoriasis on body until clear, then prn flares, avoid face, groin, axilla  Stasis dermatitis of both legs lower legs  With Lipodermatosclerosis - Discussed chronic condition, no cure Chronic and persistent condition with duration or expected duration over one year. Condition is symptomatic/ bothersome to patient. Not currently at goal.   Stasis in the legs causes chronic leg swelling, which may result in itchy or painful rashes, skin discoloration, skin texture changes, and sometimes ulceration.  Recommend daily graduated compression hose/stockings- easiest to put on first thing in morning, remove at bedtime.  Elevate legs as much as possible. Avoid salt/sodium rich foods.  Continue lymphedema pumps 1-2x daily. Continue CeraVe Ointment daily. Discussed moisturizer with Alpha hydroxy acid, samples of AmLactin given to patient.  Continue daily compression hose   Patient does not like treating legs with a topical steroid due to side effect of skin atrophy.   Continue Zoryve Cream (patient has trade sample) Apply to affected areas legs once a day.   Continue Silavadene to AA prn.      Return in about 4 months (around 06/11/2022) for Stasis Derm, Psoriasis.  IJamesetta Orleans, CMA, am acting as scribe for [name], MD . Documentation: I have reviewed the above documentation for accuracy and completeness, and I agree with the above.  Baxter Flattery  Nicole Kindred MD

## 2022-02-11 NOTE — Telephone Encounter (Signed)
I called thr pt and left a VM making her aware of the Nps instructions.

## 2022-02-12 ENCOUNTER — Ambulatory Visit: Payer: Medicare Other

## 2022-02-12 ENCOUNTER — Other Ambulatory Visit: Payer: Self-pay

## 2022-02-12 ENCOUNTER — Telehealth: Payer: Self-pay | Admitting: Family Medicine

## 2022-02-12 DIAGNOSIS — G8929 Other chronic pain: Secondary | ICD-10-CM | POA: Diagnosis not present

## 2022-02-12 DIAGNOSIS — M6281 Muscle weakness (generalized): Secondary | ICD-10-CM | POA: Diagnosis not present

## 2022-02-12 DIAGNOSIS — R262 Difficulty in walking, not elsewhere classified: Secondary | ICD-10-CM

## 2022-02-12 DIAGNOSIS — I890 Lymphedema, not elsewhere classified: Secondary | ICD-10-CM | POA: Diagnosis not present

## 2022-02-12 DIAGNOSIS — M25562 Pain in left knee: Secondary | ICD-10-CM | POA: Diagnosis not present

## 2022-02-12 NOTE — Telephone Encounter (Signed)
She is welcome to continue taking those medications as long as they don't interact with her prescribed medications.  They are likely herbal in nature and certain herbs can interact with your prescribed medications.  I would take those medications to your next visit with your PCP and they can look to see if there are interactions.

## 2022-02-12 NOTE — Therapy (Signed)
[facility] [address], [place], [address] Phone: [phone]   Fax:  [phone]  Physical Therapy Treatment  Patient Details  Name: Beth Rodgers MRN: [id number] Date of Birth: [birth year] Referring Provider (PT): Tamala Julian, [place]   Encounter Date: 02/12/2022   PT End of Session - 02/12/22 1334     Visit Number 5    Number of Visits 17    Date for PT Re-Evaluation 03/27/22    PT Start Time 4562    PT Stop Time 1414    PT Time Calculation (min) 40 min    Activity Tolerance Patient tolerated treatment well    Behavior During Therapy [facility] for tasks assessed/performed             Past Medical History:  Diagnosis Date   Basal cell carcinoma ([place])    txted with MOHs in past   Cellulitis 08/10/2018   Chronic venous insufficiency    with varicose veins   Family history of breast cancer    Family history of thyroid cancer    GERD (gastroesophageal reflux disease)    Glaucoma    History  of basal cell carcinoma    left nare/BREAST CANCER   History of chicken pox    Hypertension    Hypoglycemia    Hypothyroid    Osteoarthritis    back pain, L knee pain   Osteopenia 06/2009   DEXA 11/2015: T -2.3 hip, -2.2 spine, on longterm bisphosphonate/evista   Psoriasis    Pyogenic granuloma 04/16/2016    Past Surgical History:  Procedure Laterality Date   BASAL CELL CARCINOMA EXCISION     located on face   BREAST BIOPSY Right    core done ing [place]?   BREAST BIOPSY Left 08/16/2020   3 area bx 4:00 X, IMC, 6:00Q IMC, LN hydro #3-benign   BREAST LUMPECTOMY Left 09/11/2020   two areas of [facility]   CATARACT EXTRACTION     bilateral   DEXA  06/2009   T score Spine: -1.8, hip -2.0   EXCISION OF BREAST BIOPSY Left 09/11/2020   Procedure: EXCISION OF BREAST BIOPSY;  Surgeon: [name], MD;  Location: [facility] ORS;  Service: General;  Laterality: Left;   Foam sclerotherapy Bilateral 09/2015    [place], [place]   MASTECTOMY W/ SENTINEL NODE BIOPSY Left 06/25/2021   Procedure: MASTECTOMY WITH SENTINEL LYMPH NODE BIOPSY;  Surgeon: [name], MD;  Location: [facility] ORS;  Service: General;  Laterality: Left;   NASAL RECONSTRUCTION  2005   for [place] L nare s/p Mohs   nuclear stress test  2014   normal in [place] BX Left 09/11/2020   Procedure: PARTIAL MASTECTOMY WITH NEEDLE LOCALIZATION AND AXILLARY SENTINEL LYMPH NODE [place];  Surgeon: [name], MD;  Location: [facility] OR[facility];  Service: General;  Laterality: Left;   RADIOFREQUENCY ABLATION Left 01/2012   remnant L G saphenous vein below knee   RADIOFREQUENCY ABLATION Right 02/2013   RLE vein ablation    VEIN LIGATION AND STRIPPING  remote   bilateral G saphenous veins    There were no vitals filed for this visit.   Subjective Assessment - 02/12/22 1335     Subjective Both legs bother her. The L knee joint hurts if she twists it. The injection feels like a patch. Worked in her garden yesterday.    Pertinent History Pt is a pleasant 86  y.o. female referred to OP PT for L knee pain. PMH includes: HTN, history of breast cancer, chronic venous insufficiency, history of basal cell carcinoma, GERD. Pt  reports having bad arthritis in L knee. Pain is worsened with quick movements, too much walking, maintaining knee in a certain position for too long, turning in her sleep hurts. Pain described as sharp and deep and is denying stiffness due to being active with HEP from previous therapy she just finished earlier this week. Pain is improved with consistent exercise and rest. Pt's goal is to improve pain and mobility in L knee and balance/walking tolerance for community ADL's.    Limitations Standing;Walking    Patient Stated Goals Self management of L knee pain and improving wlaking ADL's    Currently in Pain? No/denies    Pain Onset More than a month  ago                                        PT Education - 02/12/22 1341     Education Details ther-ex    Person(s) Educated Patient    Methods Explanation;Demonstration;Tactile cues;Verbal cues    Comprehension Returned demonstration;Verbalized understanding           Objective     No latex band allergies   B varicose veins legs Osteopenia     L heel pain Hx of L mastectomy on June 25, 2021 and had home health PT for recovery.      Started using a SPC 5 months ago when ambulating by herself. Furniture walk at home.    [employer] Access Code [email or web address]       Therapeutic exercise   Standing B UE assist              Hip abduction 2 lbs ankle weight                         R 10x3                         L 10x3   LAQ 2 lbs each ankle             R 10x, then 3x5 seconds (challenging)             L 10x, then 3x5 seconds (challenging)  Standing B UE assist              Hip extension 2 lbs ankle weight                         R 10x2                         L 10x2   Side stepping at table with B UE assist 5 ft to the R and 5 ft to the L 5x to promote glute med strength    Seated hip adduction isometrics folded pillow squeeze 10x5 seconds    Hooklying hip extension leg straight to promote glute max strength             L 10x3 with 5 second holds    Hooklying manually reisted hip abduction isometrics, leg straight              L 10x3 with 5 second holds  Improved exercise technique, movement at target joints, use of target muscles after min to mod verbal, visual, tactile cues.        Response to treatment Pt tolerated session well without aggravation of symptoms.          Clinical Impression  Continued working on improving B glute med, max and quad strength to promote better L knee mechanics during closed chain tasks. Pt tolerated session well without aggravation of symptoms.  Pt will benefit from continued skilled  physical therapy services to decrease pain, improve strength and function.        PT Short Term Goals - 01/30/22 0858       PT SHORT TERM GOAL #1   Title Pt will be independent with HEP to improve L knee AROM and global strength to improve walking tasks    Baseline 01/29/22: Initiated    Time 4    Period Weeks    Status New    Target Date 02/26/22               PT Long Term Goals - 01/30/22 0859       PT LONG TERM GOAL #1   Title Pt will improve knee FOTO to target score to display clinically significant improvement in functional mobility.    Baseline 01/29/22: Next session    Time 8    Period Weeks    Status New    Target Date 03/26/22      PT LONG TERM GOAL #2   Title Pt will improve L Knee extension by 5 deg or greater to demonstrate clinically significant improvement for improved gait mechanics.    Baseline 01/29/2022: L knee lacking 7 deg from TKE. R knee extension lacking 2 deg from TKE.    Time 8    Period Weeks    Status New    Target Date 03/26/22      PT LONG TERM GOAL #3   Title Pt will improve 2 min walk test to 17' for age matched norms to improve community ambulation tasks and reduced risk of falls.    Baseline 01/29/22: 316'    Time 8    Period Weeks    Status New    Target Date 03/26/22      PT LONG TERM GOAL #4   Title Pt will improve 5xSTS  to </= 12 sec to demonstrate clinically significant improvement in LE strength.    Baseline 01/29/22: 18.13 sec with UE use on arm rests    Time 8    Period Weeks    Status New    Target Date 03/26/22                   Plan - 02/12/22 1333     Clinical Impression Statement Continued working on improving B glute med, max and quad strength to promote better L knee mechanics during closed chain tasks. Pt tolerated session well without aggravation of symptoms.  Pt will benefit from continued skilled physical therapy services to decrease pain, improve strength and function.    Personal Factors and  Comorbidities Age;Comorbidity 3+;Time since onset of injury/illness/exacerbation;Fitness;Past/Current Experience    Comorbidities Osteopenia, chronic venous insufficiency,Hx of basal cell CA S/P lumpectomy, HTN    Examination-Activity Limitations Stand;Locomotion Level;Squat;Stairs;Carry;Lift;Bed Mobility;Transfers    Examination-Participation Restrictions Community Activity    Stability/Clinical Decision Making Stable/Uncomplicated    Clinical Decision Making Low    Rehab Potential Fair    Clinical Impairments Affecting Rehab Potential Chronicity of condition,  age, fitness level    PT Frequency 2x / week    PT Duration 8 weeks    PT Treatment/Interventions Gait training;Functional mobility training;Therapeutic activities;Therapeutic exercise;Neuromuscular re-education;Patient/family education;Manual techniques;ADLs/Self Care Home Management;Aquatic Therapy;Cryotherapy;Electrical Stimulation;Moist Heat;Traction;Passive range of motion    PT Next Visit Plan Reassess HEP, address knee extension AROM limitations    PT Home Exercise Plan Access Code [place] and Agree with Plan of Care Patient             Patient will benefit from skilled therapeutic intervention in order to improve the following deficits and impairments:  Postural dysfunction, Decreased strength, Improper body mechanics, Pain, Difficulty walking, Abnormal gait, Hypomobility, Decreased activity tolerance, Decreased mobility, Decreased range of motion  Visit Diagnosis: Chronic pain of left knee  Difficulty in walking, not elsewhere classified     Problem List Patient Active Problem List   Diagnosis Date Noted   Venous ulcer of ankle, unspecified laterality ([place]) 12/24/2021   Bilateral lower extremity edema 12/17/2021   Pre-op evaluation 06/02/2021   Right wrist pain 02/28/2021   Skin rash 02/28/2021   Ascending aorta dilatation ([place]) 01/10/2021   Left-sided chest pain 01/10/2021   Genetic testing  10/30/2020   Malignant neoplasm of overlapping sites of left breast in female, estrogen receptor positive ([place]) 10/22/2020   Family history of breast cancer    Family history of thyroid cancer    Insertional Achilles tendinopathy 08/28/2020   Breast cancer, left breast ([place]) 08/01/2020   Right carotid bruit 06/05/2020   Chest pressure 06/03/2020   Left Achilles tendinitis 09/01/2019   Low back pain 08/29/2019   Lumbosacral spondylosis without myelopathy 08/29/2019   Calcaneal spur 08/17/2019   Contusion of knee 10/04/2018   Varicose veins of bilateral lower extremities with pain 03/30/2018   Lymphedema 03/30/2018   Dizziness 02/02/2017   Situational depression 12/18/2016   Vitamin D deficiency 10/11/2016   Fatigue 08/12/2016   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 04/14/2016   Essential hypertension 12/17/2015   Dermatitis of external ear 12/17/2015   Corn of foot 05/16/2015   Advanced care planning/counseling discussion 10/30/2014   Neck pain on right side 06/25/2014   Primary osteoarthritis of left knee 08/17/2013   Oropharyngeal dysphagia 11/15/2012   Stressful life events affecting family and household 03/30/2012   Medicare annual wellness visit, subsequent 03/30/2012   History of basal cell carcinoma    Osteoarthritis    Osteopenia    Hypothyroidism    Glaucoma    Chronic venous insufficiency    Joneen Boers PT, DPT  02/12/2022, 6:41 PM  [facility] [address], [place], [address] Phone: [phone]   Fax:  [phone]  Name: Beth Rodgers MRN: [id number] Date of Birth: 1935-04-30

## 2022-02-12 NOTE — Telephone Encounter (Signed)
Pt called wanting to discuss medication to help with the circulation in veins  Pt didn't want to schedule an appt

## 2022-02-12 NOTE — Telephone Encounter (Signed)
Pt. States she has two medication, one is called Venostat and the other is called Venofit for circulation that she gets over the counter and would like to know if she should stop taking them or if it is ok to continue to buy them over the counter.  She states her circulation is very bad.  She states she wears compression socks every day from the time she gets up out of bed until she goes to bed.  Please advise.

## 2022-02-13 NOTE — Telephone Encounter (Signed)
Lvm returning pt's call.

## 2022-02-17 ENCOUNTER — Ambulatory Visit: Payer: Medicare Other

## 2022-02-17 ENCOUNTER — Other Ambulatory Visit: Payer: Self-pay

## 2022-02-17 ENCOUNTER — Ambulatory Visit (INDEPENDENT_AMBULATORY_CARE_PROVIDER_SITE_OTHER): Payer: Medicare Other | Admitting: Psychology

## 2022-02-17 DIAGNOSIS — R262 Difficulty in walking, not elsewhere classified: Secondary | ICD-10-CM

## 2022-02-17 DIAGNOSIS — M25562 Pain in left knee: Secondary | ICD-10-CM | POA: Diagnosis not present

## 2022-02-17 DIAGNOSIS — G8929 Other chronic pain: Secondary | ICD-10-CM | POA: Diagnosis not present

## 2022-02-17 DIAGNOSIS — I890 Lymphedema, not elsewhere classified: Secondary | ICD-10-CM | POA: Diagnosis not present

## 2022-02-17 DIAGNOSIS — M6281 Muscle weakness (generalized): Secondary | ICD-10-CM

## 2022-02-17 DIAGNOSIS — F4323 Adjustment disorder with mixed anxiety and depressed mood: Secondary | ICD-10-CM

## 2022-02-17 NOTE — Progress Notes (Signed)
[facility] Counselor/Therapist Progress Note  Patient ID: Beth Rodgers, MRN: [id number]    Date: 02/17/22  Time Spent: 1:05 pm - 1:57 pm : 52 Minutes  Treatment Type: Individual Therapy.  Reported Symptoms: Rumination, anxiety.    Mental Status Exam: Appearance:  NA     Behavior: Appropriate  Motor: NA  Speech/Language:  Clear and Coherent and Normal Rate  Affect: Congruent  Mood: dysthymic  Thought process: normal  Thought content:   WNL  Sensory/Perceptual disturbances:   WNL  Orientation: oriented to person, place, time/date, and situation  Attention: Good  Concentration: Good  Memory: WNL  Fund of knowledge:  Good  Insight:   Good  Judgment:  Good  Impulse Control: Good   Risk Assessment: Danger to Self:  No Self-injurious Behavior: No Danger to Others: No Duty to Warn:no Physical Aggression / Violence:No  Access to Firearms a concern: No  Gang Involvement:No   Subjective:   Beth Rodgers participated from home, via phone, and consented to treatment. Therapist participated from office. We met online due to [place] pandemic. Beth Rodgers reviewed the events of the past two weeks. Beth Rodgers discussed continued health complications affecting her overall functioning and ability to engage in consistent self-care. Beth Rodgers noted feeling of guilt due to the lack of contact with her grandchildren due to their mother's disagreement with Beth Rodgers's interactions with the children. We explored this during the session and Beth Rodgers's temperament. We processed these events, during the session and discussed ways to set boundaries with self regarding rumination. Beth Rodgers discussed a need to process how to manage her feelings regarding wanting to see her grandchildren and not being able to, during our follow-up. Therapist validated Beth Rodgers experiencing feelings during the session and provided supportive therapy.  Beth Rodgers continues to benefit from individual therapy and future appointments had been  scheduled.    Interventions: Interpersonal  Diagnosis: Adjustment disorder with mixed anxiety and depressed mood  Plan: Patient is to use CBT, BA, Problem-solving, mindfulness and coping skills to help manage decrease symptoms associated with their diagnosis. (Target date: 08/16/22)   Long-term goal:   Reduce overall level, frequency, and intensity of the feelings of depression and anxiety evidenced by   decreased sadness, rumination, lethargy, and interpersonal stressors  from 6 to 7 days/week to 0 to 1 days/week per client report for at least 3 consecutive months.  Short-term goal:  Verbally express understanding of the relationship between feelings of depression, anxiety and their impact on thinking patterns and behaviors.  Verbalize an understanding of the role that distorted thinking plays in creating fears, excessive worry, and ruminations.  Engage in enjoyable activities on a consistent basis.   Beth Irish, LCSW

## 2022-02-17 NOTE — Therapy (Signed)
[facility] [address], [place], [address] Phone: [phone]   Fax:  [phone]  Physical Therapy Treatment  Patient Details  Name: Beth Rodgers MRN: [id number] Date of Birth: 03/17/1935 Referring Provider (PT): Tamala Julian, [place]   Encounter Date: 02/17/2022   PT End of Session - 02/17/22 1421     Visit Number 6    Number of Visits 17    Date for PT Re-Evaluation 03/27/22    PT Start Time 3662    PT Stop Time 9476    PT Time Calculation (min) 38 min    Activity Tolerance Patient tolerated treatment well    Behavior During Therapy [facility] for tasks assessed/performed             Past Medical History:  Diagnosis Date   Basal cell carcinoma ([place])    txted with MOHs in past   Cellulitis 08/10/2018   Chronic venous insufficiency    with varicose veins   Family history of breast cancer    Family history of thyroid cancer    GERD (gastroesophageal reflux disease)    Glaucoma    History  of basal cell carcinoma    left nare/BREAST CANCER   History of chicken pox    Hypertension    Hypoglycemia    Hypothyroid    Osteoarthritis    back pain, L knee pain   Osteopenia 06/2009   DEXA 11/2015: T -2.3 hip, -2.2 spine, on longterm bisphosphonate/evista   Psoriasis    Pyogenic granuloma 04/16/2016    Past Surgical History:  Procedure Laterality Date   BASAL CELL CARCINOMA EXCISION     located on face   BREAST BIOPSY Right    core done ing [place]?   BREAST BIOPSY Left 08/16/2020   3 area bx 4:00 X, IMC, 6:00Q IMC, LN hydro #3-benign   BREAST LUMPECTOMY Left 09/11/2020   two areas of [facility]   CATARACT EXTRACTION     bilateral   DEXA  06/2009   T score Spine: -1.8, hip -2.0   EXCISION OF BREAST BIOPSY Left 09/11/2020   Procedure: EXCISION OF BREAST BIOPSY;  Surgeon: [name], MD;  Location: [facility] ORS;  Service: General;  Laterality: Left;   Foam sclerotherapy Bilateral 09/2015    Reed Pandy, [place]   MASTECTOMY W/ SENTINEL NODE BIOPSY Left 06/25/2021   Procedure: MASTECTOMY WITH SENTINEL LYMPH NODE BIOPSY;  Surgeon: [name], MD;  Location: [facility] ORS;  Service: General;  Laterality: Left;   NASAL RECONSTRUCTION  2005   for [place] L nare s/p Mohs   nuclear stress test  2014   normal in [place] BX Left 09/11/2020   Procedure: PARTIAL MASTECTOMY WITH NEEDLE LOCALIZATION AND AXILLARY SENTINEL LYMPH NODE [place];  Surgeon: [name], MD;  Location: [facility] ORS;  Service: General;  Laterality: Left;   RADIOFREQUENCY ABLATION Left 01/2012   remnant L G saphenous vein below knee   RADIOFREQUENCY ABLATION Right 02/2013   RLE vein ablation    VEIN LIGATION AND STRIPPING  remote   bilateral G saphenous veins    There were no vitals filed for this visit.   Subjective Assessment - 02/17/22 1422     Subjective Applied gel to L knee in the middle of the night becuase it is sensitive. Feels like the second shot was better than the 3rd shot. L knee bothers her when she moves it  when lying down. No pain currently.    Pertinent History Pt is a pleasant 87 y.o. female referred to OP PT for L knee pain. PMH includes: HTN, history of breast cancer, chronic venous insufficiency, history of basal cell carcinoma, GERD. Pt  reports having bad arthritis in L knee. Pain is worsened with quick movements, too much walking, maintaining knee in a certain position for too long, turning in her sleep hurts. Pain described as sharp and deep and is denying stiffness due to being active with HEP from previous therapy she just finished earlier this week. Pain is improved with consistent exercise and rest. Pt's goal is to improve pain and mobility in L knee and balance/walking tolerance for community ADL's.    Limitations Standing;Walking    Patient Stated Goals Self management of L knee pain and improving  wlaking ADL's    Currently in Pain? No/denies    Pain Onset More than a month ago                                        PT Education - 02/17/22 1439     Education Details ther-ex    Person(s) Educated Patient    Methods Explanation;Demonstration;Tactile cues;Verbal cues    Comprehension Returned demonstration;Verbalized understanding           Objective     No latex band allergies   B varicose veins legs Osteopenia     L heel pain Hx of L mastectomy on June 25, 2021 and had home health PT for recovery.      Started using a SPC 5 months ago when ambulating by herself. Furniture walk at home.    Medbridge Access Code [email or web address]   Manual therapy TTP L medial knee joint line tibial surface Supine gentle medial rotation L leg grade 1 for pain control    Supine STM L lateral hamstrings and IT band to decrease muscle tension and fascial restrictions    Therapeutic exercise   Hooklying hip extension leg straight to promote glute max strength             L 10x2 with 5 second holds    Hooklying manually reisted hip abduction isometrics, leg straight              L 10x2 with 5 second holds  Standing B UE assist              Hip extension 2 lbs ankle weight                         R 10x                         L 10x   Standing B UE assist              Hip abduction 2 lbs ankle weight                         R 10x2                         L 10x3   Seated B ankle DF/PF 10x2  Seated B eccentric ankle PF 10x3  Decreased L Achilles pain reported afterwards.  Improved exercise technique, movement at target joints, use of target muscles after min to mod verbal, visual, tactile cues.        Response to treatment L Achilles discomfort with standing weightbearing exercises. Decreased with gentle eccentric loading.          Clinical Impression Worked on decreasing soft tissue restrictions L knee as well as improving  glute med and max strength to improve L knee mechanics during closed chain tasks. L Achilles discomfort with standing weightbearing exercises today which decreased with gentle eccentric loading. Pt will benefit from continued skilled physical therapy services to decrease pain, improve strength and function.         PT Short Term Goals - 01/30/22 0858       PT SHORT TERM GOAL #1   Title Pt will be independent with HEP to improve L knee AROM and global strength to improve walking tasks    Baseline 01/29/22: Initiated    Time 4    Period Weeks    Status New    Target Date 02/26/22               PT Long Term Goals - 01/30/22 0859       PT LONG TERM GOAL #1   Title Pt will improve knee FOTO to target score to display clinically significant improvement in functional mobility.    Baseline 01/29/22: Next session    Time 8    Period Weeks    Status New    Target Date 03/26/22      PT LONG TERM GOAL #2   Title Pt will improve L Knee extension by 5 deg or greater to demonstrate clinically significant improvement for improved gait mechanics.    Baseline 01/29/2022: L knee lacking 7 deg from TKE. R knee extension lacking 2 deg from TKE.    Time 8    Period Weeks    Status New    Target Date 03/26/22      PT LONG TERM GOAL #3   Title Pt will improve 2 min walk test to 59' for age matched norms to improve community ambulation tasks and reduced risk of falls.    Baseline 01/29/22: 316'    Time 8    Period Weeks    Status New    Target Date 03/26/22      PT LONG TERM GOAL #4   Title Pt will improve 5xSTS  to </= 12 sec to demonstrate clinically significant improvement in LE strength.    Baseline 01/29/22: 18.13 sec with UE use on arm rests    Time 8    Period Weeks    Status New    Target Date 03/26/22                   Plan - 02/17/22 1439     Clinical Impression Statement Worked on decreasing soft tissue restrictions L knee as well as improving glute med and max  strength to improve L knee mechanics during closed chain tasks. L Achilles discomfort with standing weightbearing exercises today which decreased with gentle eccentric loading. Pt will benefit from continued skilled physical therapy services to decrease pain, improve strength and function.    Personal Factors and Comorbidities Age;Comorbidity 3+;Time since onset of injury/illness/exacerbation;Fitness;Past/Current Experience    Comorbidities Osteopenia, chronic venous insufficiency,Hx of basal cell CA S/P lumpectomy, HTN    Examination-Activity Limitations Stand;Locomotion Level;Squat;Stairs;Carry;Lift;Bed Mobility;Transfers    Examination-Participation Restrictions Community Activity    Stability/Clinical Decision Making Stable/Uncomplicated  Rehab Potential Fair    Clinical Impairments Affecting Rehab Potential Chronicity of condition, age, fitness level    PT Frequency 2x / week    PT Duration 8 weeks    PT Treatment/Interventions Gait training;Functional mobility training;Therapeutic activities;Therapeutic exercise;Neuromuscular re-education;Patient/family education;Manual techniques;ADLs/Self Care Home Management;Aquatic Therapy;Cryotherapy;Electrical Stimulation;Moist Heat;Traction;Passive range of motion    PT Next Visit Plan Reassess HEP, address knee extension AROM limitations    PT Home Exercise Plan Access Code [place] and Agree with Plan of Care Patient             Patient will benefit from skilled therapeutic intervention in order to improve the following deficits and impairments:  Postural dysfunction, Decreased strength, Improper body mechanics, Pain, Difficulty walking, Abnormal gait, Hypomobility, Decreased activity tolerance, Decreased mobility, Decreased range of motion  Visit Diagnosis: Chronic pain of left knee  Difficulty in walking, not elsewhere classified  Muscle weakness (generalized)     Problem List Patient Active Problem List   Diagnosis  Date Noted   Venous ulcer of ankle, unspecified laterality ([place]) 12/24/2021   Bilateral lower extremity edema 12/17/2021   Pre-op evaluation 06/02/2021   Right wrist pain 02/28/2021   Skin rash 02/28/2021   Ascending aorta dilatation ([facility]) 01/10/2021   Left-sided chest pain 01/10/2021   Genetic testing 10/30/2020   Malignant neoplasm of overlapping sites of left breast in female, estrogen receptor positive ([place]) 10/22/2020   Family history of breast cancer    Family history of thyroid cancer    Insertional Achilles tendinopathy 08/28/2020   Breast cancer, left breast ([place]) 08/01/2020   Right carotid bruit 06/05/2020   Chest pressure 06/03/2020   Left Achilles tendinitis 09/01/2019   Low back pain 08/29/2019   Lumbosacral spondylosis without myelopathy 08/29/2019   Calcaneal spur 08/17/2019   Contusion of knee 10/04/2018   Varicose veins of bilateral lower extremities with pain 03/30/2018   Lymphedema 03/30/2018   Dizziness 02/02/2017   Situational depression 12/18/2016   Vitamin D deficiency 10/11/2016   Fatigue 08/12/2016   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 04/14/2016   Essential hypertension 12/17/2015   Dermatitis of external ear 12/17/2015   Corn of foot 05/16/2015   Advanced care planning/counseling discussion 10/30/2014   Neck pain on right side 06/25/2014   Primary osteoarthritis of left knee 08/17/2013   Oropharyngeal dysphagia 11/15/2012   Stressful life events affecting family and household 03/30/2012   Medicare annual wellness visit, subsequent 03/30/2012   History of basal cell carcinoma    Osteoarthritis    Osteopenia    Hypothyroidism    Glaucoma    Chronic venous insufficiency     Joneen Boers PT, DPT   02/17/2022, 4:47 PM  [facility] [place] [facility] [address] [place], [address] Phone: [phone]   Fax:  [phone]  Name: Beth Rodgers MRN: [id number] Date  of Birth: March 26, 1935

## 2022-02-17 NOTE — Telephone Encounter (Signed)
Lvm returning pt's call.

## 2022-02-18 NOTE — Telephone Encounter (Signed)
Returned pt's call.  Says she wants to start and OTC vein circulation pill called Venastat, since the vein doc has not prescribed anything. She wants to know what Dr. Darnell Level thinks or does he have an other recommendations.  Also, pt wants to make Dr. Darnell Level aware that she made appt with hearing doc.  Still c/o sound of water in ears. States she read about lymphatic fluid affecting hearing.

## 2022-02-19 ENCOUNTER — Other Ambulatory Visit: Payer: Self-pay

## 2022-02-19 ENCOUNTER — Ambulatory Visit: Payer: Medicare Other

## 2022-02-19 DIAGNOSIS — R262 Difficulty in walking, not elsewhere classified: Secondary | ICD-10-CM | POA: Diagnosis not present

## 2022-02-19 DIAGNOSIS — M6281 Muscle weakness (generalized): Secondary | ICD-10-CM

## 2022-02-19 DIAGNOSIS — I890 Lymphedema, not elsewhere classified: Secondary | ICD-10-CM | POA: Diagnosis not present

## 2022-02-19 DIAGNOSIS — G8929 Other chronic pain: Secondary | ICD-10-CM | POA: Diagnosis not present

## 2022-02-19 DIAGNOSIS — M25562 Pain in left knee: Secondary | ICD-10-CM

## 2022-02-19 NOTE — Therapy (Signed)
[facility] [address], [place], [address] Phone: [phone]   Fax:  [phone]  Physical Therapy Treatment  Patient Details  Name: Beth Rodgers MRN: [id number] Date of Birth: 09/29/1935 Referring Provider (PT): Tamala Julian, [place]   Encounter Date: 02/19/2022   PT End of Session - 02/19/22 1330     Visit Number 7    Number of Visits 17    Date for PT Re-Evaluation 03/27/22    PT Start Time 2706    PT Stop Time 1413    PT Time Calculation (min) 41 min    Activity Tolerance Patient tolerated treatment well    Behavior During Therapy [facility] for tasks assessed/performed             Past Medical History:  Diagnosis Date   Basal cell carcinoma ([place])    txted with MOHs in past   Cellulitis 08/10/2018   Chronic venous insufficiency    with varicose veins   Family history of breast cancer    Family history of thyroid cancer    GERD (gastroesophageal reflux disease)    Glaucoma    History  of basal cell carcinoma    left nare/BREAST CANCER   History of chicken pox    Hypertension    Hypoglycemia    Hypothyroid    Osteoarthritis    back pain, L knee pain   Osteopenia 06/2009   DEXA 11/2015: T -2.3 hip, -2.2 spine, on longterm bisphosphonate/evista   Psoriasis    Pyogenic granuloma 04/16/2016    Past Surgical History:  Procedure Laterality Date   BASAL CELL CARCINOMA EXCISION     located on face   BREAST BIOPSY Right    core done ing [place]?   BREAST BIOPSY Left 08/16/2020   3 area bx 4:00 X, IMC, 6:00Q IMC, LN hydro #3-benign   BREAST LUMPECTOMY Left 09/11/2020   two areas of [facility]   CATARACT EXTRACTION     bilateral   DEXA  06/2009   T score Spine: -1.8, hip -2.0   EXCISION OF BREAST BIOPSY Left 09/11/2020   Procedure: EXCISION OF BREAST BIOPSY;  Surgeon: [name], MD;  Location: [facility] ORS;  Service: General;  Laterality: Left;   Foam sclerotherapy Bilateral 09/2015    Reed [place], [place]   MASTECTOMY W/ SENTINEL NODE BIOPSY Left 06/25/2021   Procedure: MASTECTOMY WITH SENTINEL LYMPH NODE BIOPSY;  Surgeon: [name], MD;  Location: [facility] ORS;  Service: General;  Laterality: Left;   NASAL RECONSTRUCTION  2005   for [place] L nare s/p Mohs   nuclear stress test  2014   normal in [place] BX Left 09/11/2020   Procedure: PARTIAL MASTECTOMY WITH NEEDLE LOCALIZATION AND AXILLARY SENTINEL LYMPH NODE [place];  Surgeon: [name], MD;  Location: [facility] ORS;  Service: General;  Laterality: Left;   RADIOFREQUENCY ABLATION Left 01/2012   remnant L G saphenous vein below knee   RADIOFREQUENCY ABLATION Right 02/2013   RLE vein ablation    VEIN LIGATION AND STRIPPING  remote   bilateral G saphenous veins    There were no vitals filed for this visit.   Subjective Assessment - 02/19/22 1334     Subjective L knee is starting to bother her a little more. Feeling it more, hearing her bones move. Does not think she has done anything that might have injured it. 1/10 L knee pain currently.  Also feels a burning sensation L Achilles tendon. L Achilles Tendon is about a 2/10 currently.    Pertinent History Pt is a pleasant 87 y.o. female referred to OP PT for L knee pain. PMH includes: HTN, history of breast cancer, chronic venous insufficiency, history of basal cell carcinoma, GERD. Pt  reports having bad arthritis in L knee. Pain is worsened with quick movements, too much walking, maintaining knee in a certain position for too long, turning in her sleep hurts. Pain described as sharp and deep and is denying stiffness due to being active with HEP from previous therapy she just finished earlier this week. Pain is improved with consistent exercise and rest. Pt's goal is to improve pain and mobility in L knee and balance/walking tolerance for community ADL's.    Limitations Standing;Walking     Patient Stated Goals Self management of L knee pain and improving wlaking ADL's    Currently in Pain? Yes    Pain Score 1     Pain Onset More than a month ago                                        PT Education - 02/19/22 1347     Education Details ther-ex    Person(s) Educated Patient    Methods Explanation;Demonstration;Tactile cues;Verbal cues    Comprehension Returned demonstration;Verbalized understanding               Objective     No latex band allergies   B varicose veins legs Osteopenia     L heel pain Hx of L mastectomy on June 25, 2021 and had home health PT for recovery.      Started using a SPC 5 months ago when ambulating by herself. Furniture walk at home.    Medbridge Access Code [email or web address]    Therapeutic exercise   Seated manually resisted L ankle PF isometrics 1 min at 40% effort x 5  Decreased L achilles burning sensation  Seated trunk flexion stretch 20 seconds x 3  Seated manually resisted clamshell isometric, hips less than 90 degrees flexion   L 10x5 seconds for 3 sets  Seated manually resisted hip extension isometrics   L 10x5 seconds for 3 sets  Seated B hip adduction folded pillow squeeze isometrics 10x10 seconds     Standing B UE assist              Hip abduction 2 lbs ankle weight                                                L 10x3   Seated B ankle DF/PF 30x   Seated B eccentric ankle PF 10x2         Improved exercise technique, movement at target joints, use of target muscles after min to mod verbal, visual, tactile cues.        Response to treatment Decreased L achilles pain reported after session.           Clinical Impression  Continued working on isometric loading to L achilles to promote healing. Continued working on improving glute med and max strength to promote better mechanics to L knee during gait. L knee crepitus noticed at times with exercises. Pt tolerated session  well without aggravation of symptoms. Decreased L Achilles pain with eccentric muscle activation. 0.5/10 L achilles and 1/10 L knee pain during gait with SPC after session. Pt will benefit from continued skilled physical therapy services to decrease pain, improve strength and function.       PT Short Term Goals - 01/30/22 0858       PT SHORT TERM GOAL #1   Title Pt will be independent with HEP to improve L knee AROM and global strength to improve walking tasks    Baseline 01/29/22: Initiated    Time 4    Period Weeks    Status New    Target Date 02/26/22               PT Long Term Goals - 01/30/22 0859       PT LONG TERM GOAL #1   Title Pt will improve knee FOTO to target score to display clinically significant improvement in functional mobility.    Baseline 01/29/22: Next session    Time 8    Period Weeks    Status New    Target Date 03/26/22      PT LONG TERM GOAL #2   Title Pt will improve L Knee extension by 5 deg or greater to demonstrate clinically significant improvement for improved gait mechanics.    Baseline 01/29/2022: L knee lacking 7 deg from TKE. R knee extension lacking 2 deg from TKE.    Time 8    Period Weeks    Status New    Target Date 03/26/22      PT LONG TERM GOAL #3   Title Pt will improve 2 min walk test to 57' for age matched norms to improve community ambulation tasks and reduced risk of falls.    Baseline 01/29/22: 316'    Time 8    Period Weeks    Status New    Target Date 03/26/22      PT LONG TERM GOAL #4   Title Pt will improve 5xSTS  to </= 12 sec to demonstrate clinically significant improvement in LE strength.    Baseline 01/29/22: 18.13 sec with UE use on arm rests    Time 8    Period Weeks    Status New    Target Date 03/26/22                   Plan - 02/19/22 1330     Clinical Impression Statement Continued working on isometric loading to L achilles to promote healing. Continued working on improving glute med and max  strength to promote better mechanics to L knee during gait. L knee crepitus noticed at times with exercises. Pt tolerated session well without aggravation of symptoms. Decreased L Achilles pain with eccentric muscle activation. 0.5/10 L achilles and 1/10 L knee pain during gait with SPC after session. Pt will benefit from continued skilled physical therapy services to decrease pain, improve strength and function.    Personal Factors and Comorbidities Age;Comorbidity 3+;Time since onset of injury/illness/exacerbation;Fitness;Past/Current Experience    Comorbidities Osteopenia, chronic venous insufficiency,Hx of basal cell CA S/P lumpectomy, HTN    Examination-Activity Limitations Stand;Locomotion Level;Squat;Stairs;Carry;Lift;Bed Mobility;Transfers    Examination-Participation Restrictions Community Activity    Stability/Clinical Decision Making Stable/Uncomplicated    Rehab Potential Fair    Clinical Impairments Affecting Rehab Potential Chronicity of condition, age, fitness level    PT Frequency 2x / week    PT Duration 8 weeks    PT Treatment/Interventions Gait  training;Functional mobility training;Therapeutic activities;Therapeutic exercise;Neuromuscular re-education;Patient/family education;Manual techniques;ADLs/Self Care Home Management;Aquatic Therapy;Cryotherapy;Electrical Stimulation;Moist Heat;Traction;Passive range of motion    PT Next Visit Plan Reassess HEP, address knee extension AROM limitations    PT Home Exercise Plan Access Code [place] and Agree with Plan of Care Patient             Patient will benefit from skilled therapeutic intervention in order to improve the following deficits and impairments:  Postural dysfunction, Decreased strength, Improper body mechanics, Pain, Difficulty walking, Abnormal gait, Hypomobility, Decreased activity tolerance, Decreased mobility, Decreased range of motion  Visit Diagnosis: Chronic pain of left knee  Difficulty in  walking, not elsewhere classified  Muscle weakness (generalized)     Problem List Patient Active Problem List   Diagnosis Date Noted   Venous ulcer of ankle, unspecified laterality ([place]) 12/24/2021   Bilateral lower extremity edema 12/17/2021   Pre-op evaluation 06/02/2021   Right wrist pain 02/28/2021   Skin rash 02/28/2021   Ascending aorta dilatation ([place]) 01/10/2021   Left-sided chest pain 01/10/2021   Genetic testing 10/30/2020   Malignant neoplasm of overlapping sites of left breast in female, estrogen receptor positive ([place]) 10/22/2020   Family history of breast cancer    Family history of thyroid cancer    Insertional Achilles tendinopathy 08/28/2020   Breast cancer, left breast ([place]) 08/01/2020   Right carotid bruit 06/05/2020   Chest pressure 06/03/2020   Left Achilles tendinitis 09/01/2019   Low back pain 08/29/2019   Lumbosacral spondylosis without myelopathy 08/29/2019   Calcaneal spur 08/17/2019   Contusion of knee 10/04/2018   Varicose veins of bilateral lower extremities with pain 03/30/2018   Lymphedema 03/30/2018   Dizziness 02/02/2017   Situational depression 12/18/2016   Vitamin D deficiency 10/11/2016   Fatigue 08/12/2016   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 04/14/2016   Essential hypertension 12/17/2015   Dermatitis of external ear 12/17/2015   Corn of foot 05/16/2015   Advanced care planning/counseling discussion 10/30/2014   Neck pain on right side 06/25/2014   Primary osteoarthritis of left knee 08/17/2013   Oropharyngeal dysphagia 11/15/2012   Stressful life events affecting family and household 03/30/2012   Medicare annual wellness visit, subsequent 03/30/2012   History of basal cell carcinoma    Osteoarthritis    Osteopenia    Hypothyroidism    Glaucoma    Chronic venous insufficiency    Joneen Boers PT, DPT  02/19/2022, 4:18 PM  [place] [facility] [address], [place], [address] Phone: [phone]   Fax:  [phone]  Name: BRICIA TAHER MRN: [id number] Date of Birth: 04-15-1935

## 2022-02-19 NOTE — Telephone Encounter (Signed)
Active ingredient is horse chestnut seed extract which can help with chronic venous insufficiency. Side effects may include gi upset, headache, dizziness. Watch for these, ok to try med.  Have hearing doctor send me his report if possible.

## 2022-02-19 NOTE — Telephone Encounter (Signed)
Spoke with pt relaying Dr. G's message. Pt verbalizes understanding.  

## 2022-02-22 ENCOUNTER — Emergency Department: Payer: Medicare Other

## 2022-02-22 ENCOUNTER — Emergency Department
Admission: EM | Admit: 2022-02-22 | Discharge: 2022-02-22 | Disposition: A | Discharge status: Home / Self Care | Payer: Medicare Other | Attending: Emergency Medicine | Admitting: Emergency Medicine

## 2022-02-22 ENCOUNTER — Other Ambulatory Visit: Payer: Self-pay

## 2022-02-22 DIAGNOSIS — M2669 Other specified disorders of temporomandibular joint: Secondary | ICD-10-CM | POA: Diagnosis not present

## 2022-02-22 DIAGNOSIS — M542 Cervicalgia: Secondary | ICD-10-CM | POA: Diagnosis not present

## 2022-02-22 DIAGNOSIS — R221 Localized swelling, mass and lump, neck: Secondary | ICD-10-CM | POA: Diagnosis not present

## 2022-02-22 DIAGNOSIS — M47812 Spondylosis without myelopathy or radiculopathy, cervical region: Secondary | ICD-10-CM | POA: Diagnosis not present

## 2022-02-22 DIAGNOSIS — I6523 Occlusion and stenosis of bilateral carotid arteries: Secondary | ICD-10-CM | POA: Diagnosis not present

## 2022-02-22 LAB — I-STAT CREATININE, ED: Creatinine, Ser: 0.6 mg/dL (ref 0.44–1.00)

## 2022-02-22 MED ORDER — IOHEXOL 300 MG/ML  SOLN
75.0000 mL | Freq: Once | INTRAMUSCULAR | Status: AC | PRN
  Administered 2022-02-22: 75 mL via INTRAVENOUS
  Filled 2022-02-22: qty 75

## 2022-02-22 NOTE — ED Triage Notes (Addendum)
Pt states that she started to notice pain in the R side of her neck that hurts when she swallows- pt states she thinks she feels a nodule in that side of her neck as well- pt is concerned about her thyroid d/t her mother dying of Graves disease, pt does take synthroid

## 2022-02-22 NOTE — ED Provider Notes (Signed)
[facility] Provider Note    Event Date/Time   First MD Initiated Contact with Patient 02/22/22 1416     (approximate)   History   Neck Pain   HPI  Beth Rodgers is a 87 y.o. female presents emergency department with sharp stabbing pain along the right side of the neck.  More increased pain with swallowing.  Has history of thyroid disease.  Is worried because she had a mastectomy and gets swelling but also because her mother died of thyroid cancer had Graves' disease.  She is unsure.  No fever or chills.  No drooling.  No strokelike symptoms.  States the pain is only when she swallows.      Physical Exam   Triage Vital Signs: ED Triage Vitals  Enc Vitals Group     BP 02/22/22 1321 (!) 186/80     Pulse Rate 02/22/22 1321 69     Resp 02/22/22 1321 18     Temp 02/22/22 1321 97.7 F (36.5 C)     Temp Source 02/22/22 1321 Oral     SpO2 02/22/22 1321 99 %     Weight 02/22/22 1322 170 lb (77.1 kg)     Height 02/22/22 1322 5\' 5"  (1.651 m)     Head Circumference --      Peak Flow --      Pain Score --      Pain Loc --      Pain Edu? --      Excl. in [place]? --     Most recent vital signs: Vitals:   02/22/22 1321  BP: (!) 186/80  Pulse: 69  Resp: 18  Temp: 97.7 F (36.5 C)  SpO2: 99%     General: Awake, no distress.   CV:  Good peripheral perfusion. regular rate and  rhythm Resp:  Normal effort. Lungs CTA Abd:  No distention.   Other:  Throat is irritated posteriorly, no foreign body or bleeding noted, no swelling noted   ED Results / Procedures / Treatments   Labs (all labs ordered are listed, but only abnormal results are displayed) Labs Reviewed  COMPREHENSIVE METABOLIC PANEL  CBC WITH DIFFERENTIAL/PLATELET     EKG     RADIOLOGY CT soft tissue of the neck    PROCEDURES:   Procedures   MEDICATIONS ORDERED IN ED: Medications  iohexol (OMNIPAQUE) 300 MG/ML solution 75 mL (75 mLs Intravenous Contrast Given 02/22/22  1550)     IMPRESSION / MDM / ASSESSMENT AND PLAN / ED COURSE  I reviewed the triage vital signs and the nursing notes.                              Differential diagnosis includes, but is not limited to, foreign body, subtle lymphadenopathy, reactive lymphadenopathy, abscess, tumor  Labs and imaging ordered  Labs not performed by her nursing staff.  CT did, get the patient and started IV.  They did the CT with contrast.  I did independently review the CT and do not see any acute abnormality.  Awaiting radiology read  Radiologist states no acute abnormality.  Did explain the findings to the patient.  She is to follow-up with her regular doctor.  Do not feel she needs admission as she is stable, vitals normal.  She is only having irritation when she swallows.  She has been rubbing her neck to help with the lymph system.  She states she  has been learning how to do this due to the recent left-sided mastectomy that was done over 6 months ago.  She can follow-up with her regular doctor.  She does have an appointment this week.  She was discharged stable condition.  Strict instructions to return if worsening.       FINAL CLINICAL IMPRESSION(S) / ED DIAGNOSES   Final diagnoses:  Neck pain     Rx / DC Orders   ED Discharge Orders     None        Note:  This document was prepared using Dragon voice recognition software and may include unintentional dictation errors.    Versie Starks, PA-C 02/22/22 1646    Lucrezia Starch, MD 02/22/22 [phone]

## 2022-02-22 NOTE — Discharge Instructions (Signed)
Follow-up with your regular doctor.  Return emergency department worsening.  Your CT did not show any abnormal findings.

## 2022-02-23 ENCOUNTER — Ambulatory Visit: Payer: Medicare Other | Admitting: Occupational Therapy

## 2022-02-24 ENCOUNTER — Ambulatory Visit: Payer: Medicare Other

## 2022-02-24 ENCOUNTER — Other Ambulatory Visit: Payer: Self-pay

## 2022-02-24 ENCOUNTER — Ambulatory Visit: Payer: Medicare Other | Admitting: Occupational Therapy

## 2022-02-24 DIAGNOSIS — G8929 Other chronic pain: Secondary | ICD-10-CM | POA: Diagnosis not present

## 2022-02-24 DIAGNOSIS — M6281 Muscle weakness (generalized): Secondary | ICD-10-CM

## 2022-02-24 DIAGNOSIS — R262 Difficulty in walking, not elsewhere classified: Secondary | ICD-10-CM | POA: Diagnosis not present

## 2022-02-24 DIAGNOSIS — I890 Lymphedema, not elsewhere classified: Secondary | ICD-10-CM | POA: Diagnosis not present

## 2022-02-24 DIAGNOSIS — M25562 Pain in left knee: Secondary | ICD-10-CM | POA: Diagnosis not present

## 2022-02-24 NOTE — Therapy (Signed)
[facility] [address], [place], [address] Phone: [phone]   Fax:  [phone]  Physical Therapy Treatment  Patient Details  Name: Beth Rodgers MRN: [id number] Date of Birth: 1935-03-02 Referring Provider (PT): Tamala Julian, [place]   Encounter Date: 02/24/2022   PT End of Session - 02/24/22 1334     Visit Number 8    Number of Visits 17    Date for PT Re-Evaluation 03/27/22    PT Start Time 3013    PT Stop Time 1414    PT Time Calculation (min) 40 min    Activity Tolerance Patient tolerated treatment well    Behavior During Therapy [facility] for tasks assessed/performed             Past Medical History:  Diagnosis Date   Basal cell carcinoma ([place])    txted with MOHs in past   Cellulitis 08/10/2018   Chronic venous insufficiency    with varicose veins   Family history of breast cancer    Family history of thyroid cancer    GERD (gastroesophageal reflux disease)    Glaucoma    History  of basal cell carcinoma    left nare/BREAST CANCER   History of chicken pox    Hypertension    Hypoglycemia    Hypothyroid    Osteoarthritis    back pain, L knee pain   Osteopenia 06/2009   DEXA 11/2015: T -2.3 hip, -2.2 spine, on longterm bisphosphonate/evista   Psoriasis    Pyogenic granuloma 04/16/2016    Past Surgical History:  Procedure Laterality Date   BASAL CELL CARCINOMA EXCISION     located on face   BREAST BIOPSY Right    core done ing [place]?   BREAST BIOPSY Left 08/16/2020   3 area bx 4:00 X, IMC, 6:00Q IMC, LN hydro #3-benign   BREAST LUMPECTOMY Left 09/11/2020   two areas of [facility]   CATARACT EXTRACTION     bilateral   DEXA  06/2009   T score Spine: -1.8, hip -2.0   EXCISION OF BREAST BIOPSY Left 09/11/2020   Procedure: EXCISION OF BREAST BIOPSY;  Surgeon: [name], MD;  Location: [facility] ORS;  Service: General;  Laterality: Left;   Foam sclerotherapy Bilateral 09/2015    [place], [place]   MASTECTOMY W/ SENTINEL NODE BIOPSY Left 06/25/2021   Procedure: MASTECTOMY WITH SENTINEL LYMPH NODE BIOPSY;  Surgeon: [name], MD;  Location: [facility] ORS;  Service: General;  Laterality: Left;   NASAL RECONSTRUCTION  2005   for [place] L nare s/p Mohs   nuclear stress test  2014   normal in [place] BX Left 09/11/2020   Procedure: PARTIAL MASTECTOMY WITH NEEDLE LOCALIZATION AND AXILLARY SENTINEL LYMPH NODE [place];  Surgeon: [name], MD;  Location: [facility] ORS;  Service: General;  Laterality: Left;   RADIOFREQUENCY ABLATION Left 01/2012   remnant L G saphenous vein below knee   RADIOFREQUENCY ABLATION Right 02/2013   RLE vein ablation    VEIN LIGATION AND STRIPPING  remote   bilateral G saphenous veins    There were no vitals filed for this visit.   Subjective Assessment - 02/24/22 1336     Subjective The L knee is doing better than the heel. L Achilles feels like a burning sensation. 1/10 L knee, 3/10 L Achilles area.    Pertinent History Pt is a pleasant 87 y.o.  female referred to OP PT for L knee pain. PMH includes: HTN, history of breast cancer, chronic venous insufficiency, history of basal cell carcinoma, GERD. Pt  reports having bad arthritis in L knee. Pain is worsened with quick movements, too much walking, maintaining knee in a certain position for too long, turning in her sleep hurts. Pain described as sharp and deep and is denying stiffness due to being active with HEP from previous therapy she just finished earlier this week. Pain is improved with consistent exercise and rest. Pt's goal is to improve pain and mobility in L knee and balance/walking tolerance for community ADL's.    Limitations Standing;Walking    Patient Stated Goals Self management of L knee pain and improving wlaking ADL's    Currently in Pain? Yes    Pain Onset More than a month ago                                         PT Education - 02/24/22 1342     Education Details ther-ex    Person(s) Educated Patient    Methods Explanation;Demonstration;Tactile cues;Verbal cues    Comprehension Returned demonstration;Verbalized understanding               Objective     No latex band allergies   B varicose veins legs Osteopenia     L heel pain Hx of L mastectomy on June 25, 2021 and had home health PT for recovery.      Started using a SPC 5 months ago when ambulating by herself. Furniture walk at home.    Med[employer] Access Code [email or web address]     Therapeutic exercise   Hooklying posterior pelvic tilt 10x3 with 5 second holds  Hooklying L knee extension at about 80 degrees hip flexion 10x2  Hooklying hip extension leg straight to promote glute max strength             L 10x2 with 5 second holds, then 7x5 seconds. L Achilles burning sensation afterwards, eases with rest.    Hooklying manually reisted hip abduction isometrics, leg straight              L 10x3 with 5 second holds   Supine L SKTC 20 seconds        Improved exercise technique, movement at target joints, use of target muscles after min to mod verbal, visual, tactile cues.     Manual therapy Seated STM B lumbar paraspinal muscles to decrease tension (secondary to possible L sciatic symptoms)    Seated STM L lateral hamstrings to decrease tension     Response to treatment No L achilles burning (just at dull sensation) and no L knee pain during gait after session.             Clinical Impression Possible L sciatic nerve involvement L LE secondary to reports of burning at Achilles tendon area which decreased with treatment to decrease L lumbar extension pressure. Continued working on improving glute med, max strength and decreasing L lateral hamstrings muscle tension to promote better mechanics to her L knee. No L Achilles burning sensation (just a dull sensation) and  no L knee pain reported after session during gait. Pt will benefit from continued skilled physical therapy services to decrease pain, improve strength and function.      PT Short Term Goals - 01/30/22 9417  PT SHORT TERM GOAL #1   Title Pt will be independent with HEP to improve L knee AROM and global strength to improve walking tasks    Baseline 01/29/22: Initiated    Time 4    Period Weeks    Status New    Target Date 02/26/22               PT Long Term Goals - 01/30/22 0859       PT LONG TERM GOAL #1   Title Pt will improve knee FOTO to target score to display clinically significant improvement in functional mobility.    Baseline 01/29/22: Next session    Time 8    Period Weeks    Status New    Target Date 03/26/22      PT LONG TERM GOAL #2   Title Pt will improve L Knee extension by 5 deg or greater to demonstrate clinically significant improvement for improved gait mechanics.    Baseline 01/29/2022: L knee lacking 7 deg from TKE. R knee extension lacking 2 deg from TKE.    Time 8    Period Weeks    Status New    Target Date 03/26/22      PT LONG TERM GOAL #3   Title Pt will improve 2 min walk test to 38' for age matched norms to improve community ambulation tasks and reduced risk of falls.    Baseline 01/29/22: 316'    Time 8    Period Weeks    Status New    Target Date 03/26/22      PT LONG TERM GOAL #4   Title Pt will improve 5xSTS  to </= 12 sec to demonstrate clinically significant improvement in LE strength.    Baseline 01/29/22: 18.13 sec with UE use on arm rests    Time 8    Period Weeks    Status New    Target Date 03/26/22                   Plan - 02/24/22 1333     Clinical Impression Statement Possible L sciatic nerve involvement L LE secondary to reports of burning at Achilles tendon area which decreased with treatment to decrease L lumbar extension pressure. Continued working on improving glute med, max strength and decreasing L  lateral hamstrings muscle tension to promote better mechanics to her L knee. No L Achilles burning sensation (just a dull sensation) and no L knee pain reported after session during gait. Pt will benefit from continued skilled physical therapy services to decrease pain, improve strength and function.    Personal Factors and Comorbidities Age;Comorbidity 3+;Time since onset of injury/illness/exacerbation;Fitness;Past/Current Experience    Comorbidities Osteopenia, chronic venous insufficiency,Hx of basal cell CA S/P lumpectomy, HTN    Examination-Activity Limitations Stand;Locomotion Level;Squat;Stairs;Carry;Lift;Bed Mobility;Transfers    Examination-Participation Restrictions Community Activity    Stability/Clinical Decision Making Stable/Uncomplicated    Rehab Potential Fair    Clinical Impairments Affecting Rehab Potential Chronicity of condition, age, fitness level    PT Frequency 2x / week    PT Duration 8 weeks    PT Treatment/Interventions Gait training;Functional mobility training;Therapeutic activities;Therapeutic exercise;Neuromuscular re-education;Patient/family education;Manual techniques;ADLs/Self Care Home Management;Aquatic Therapy;Cryotherapy;Electrical Stimulation;Moist Heat;Traction;Passive range of motion    PT Next Visit Plan Reassess HEP, address knee extension AROM limitations    PT Home Exercise Plan Access Code [place] and Agree with Plan of Care Patient  Patient will benefit from skilled therapeutic intervention in order to improve the following deficits and impairments:  Postural dysfunction, Decreased strength, Improper body mechanics, Pain, Difficulty walking, Abnormal gait, Hypomobility, Decreased activity tolerance, Decreased mobility, Decreased range of motion  Visit Diagnosis: Chronic pain of left knee  Difficulty in walking, not elsewhere classified  Muscle weakness (generalized)     Problem List Patient Active Problem List    Diagnosis Date Noted   Venous ulcer of ankle, unspecified laterality ([place]) 12/24/2021   Bilateral lower extremity edema 12/17/2021   Pre-op evaluation 06/02/2021   Right wrist pain 02/28/2021   Skin rash 02/28/2021   Ascending aorta dilatation ([place]) 01/10/2021   Left-sided chest pain 01/10/2021   Genetic testing 10/30/2020   Malignant neoplasm of overlapping sites of left breast in female, estrogen receptor positive ([place]) 10/22/2020   Family history of breast cancer    Family history of thyroid cancer    Insertional Achilles tendinopathy 08/28/2020   Breast cancer, left breast (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 04/14/2016   Essential hypertension 12/17/2015   Dermatitis of external ear 12/17/2015   Corn of foot 05/16/2015   Advanced care planning/counseling discussion 10/30/2014   Neck pain on right side 06/25/2014   Primary osteoarthritis of left knee 08/17/2013   Oropharyngeal dysphagia 11/15/2012   Stressful life events affecting family and household 03/30/2012   Medicare annual wellness visit, subsequent 03/30/2012   History of basal cell carcinoma    Osteoarthritis    Osteopenia    Hypothyroidism    Glaucoma    Chronic venous insufficiency    Joneen Boers PT, DPT  02/24/2022, 2:31 PM  [facility] [address] [place], [address] Phone: [phone]   Fax:  [phone]  Name: LATIA MATAYA MRN:  [id number] Date of Birth: 04-Oct-1935

## 2022-02-24 NOTE — Therapy (Signed)
[facility] [address], [place], [address] Phone: [phone]   Fax:  [phone]  Occupational Therapy Treatment  Patient Details  Name: Beth Rodgers MRN: [id number] Date of Birth: [birth year] Referring Provider (OT): Cintron   Encounter Date: 02/24/2022   OT End of Session - 02/24/22 1717     Visit Number 3    Number of Visits 4    Date for OT Re-Evaluation 03/31/22    OT Start Time 1550    OT Stop Time 6962    OT Time Calculation (min) 25 min    Activity Tolerance Patient tolerated treatment well    Behavior During Therapy [facility] for tasks assessed/performed             Past Medical History:  Diagnosis Date   Basal cell carcinoma ([place])    txted with MOHs in past   Cellulitis 08/10/2018   Chronic venous insufficiency    with varicose veins   Family history of breast cancer    Family history of thyroid cancer    GERD (gastroesophageal reflux disease)    Glaucoma    History  of basal cell carcinoma    left nare/BREAST CANCER   History of chicken pox    Hypertension    Hypoglycemia    Hypothyroid    Osteoarthritis    back pain, L knee pain   Osteopenia 06/2009   DEXA 11/2015: T -2.3 hip, -2.2 spine, on longterm bisphosphonate/evista   Psoriasis    Pyogenic granuloma 04/16/2016    Past Surgical History:  Procedure Laterality Date   BASAL CELL CARCINOMA EXCISION     located on face   BREAST BIOPSY Right    core done ing [place]?   BREAST BIOPSY Left 08/16/2020   3 area bx 4:00 X, IMC, 6:00Q IMC, LN hydro #3-benign   BREAST LUMPECTOMY Left 09/11/2020   two areas of [facility]   CATARACT EXTRACTION     bilateral   DEXA  06/2009   T score Spine: -1.8, hip -2.0   EXCISION OF BREAST BIOPSY Left 09/11/2020   Procedure: EXCISION OF BREAST BIOPSY;  Surgeon: [name], MD;  Location: [facility] ORS;  Service: General;  Laterality: Left;   Foam sclerotherapy Bilateral 09/2015    Reed Pandy, [place]   MASTECTOMY W/ SENTINEL NODE BIOPSY Left 06/25/2021   Procedure: MASTECTOMY WITH SENTINEL LYMPH NODE BIOPSY;  Surgeon: [name], MD;  Location: [facility] ORS;  Service: General;  Laterality: Left;   NASAL RECONSTRUCTION  2005   for [place] L nare s/p Mohs   nuclear stress test  2014   normal in [place] BX Left 09/11/2020   Procedure: PARTIAL MASTECTOMY WITH NEEDLE LOCALIZATION AND AXILLARY SENTINEL LYMPH NODE [place];  Surgeon: [name], MD;  Location: [facility] ORS;  Service: General;  Laterality: Left;   RADIOFREQUENCY ABLATION Left 01/2012   remnant L G saphenous vein below knee   RADIOFREQUENCY ABLATION Right 02/2013   RLE vein ablation    VEIN LIGATION AND STRIPPING  remote   bilateral G saphenous veins    There were no vitals filed for this visit.   Subjective Assessment - 02/24/22 1715     Subjective  I thnk some of this lymph moved into my head now - my ears feels like I am under the water and drainage - I google some massage and was doing that but then also  ended up at ER- but got CT scan and feels better now about not having cancer in my neck    Pertinent History seen DR Tildon Husky on 12/15/21 - s/p mastectomy 06/25/21 - Seroma of breast (N64.89)  -Patient concerned about recurrence of seroma of the left chest. The left chest wound is adequately healed. There is no fluid collection. I discussed with the patient that this one of the lower extremities is due to venous insufficiency and the swelling of the left chest is usually due to lymphatic insufficiency. These are 2 different things. I also discussed with patient that the swelling of the chest was not radiated to the lower extremity either. She felt more comfortable with the discussion. She will continue seeing vascular surgery for wound care.     PLAN:   1. The swelling of the chest is not related to the swelling of your legs.   2. Continue treatment of leg swelling by vascular service  - pt end of Dec refer to OT for lymphedema    Patient Stated Goals Want to know what to do for the lymphedema in my legs, under my breast and arm    Currently in Pain? No/denies                 LYMPHEDEMA/ONCOLOGY QUESTIONNAIRE - 02/24/22 0001       Right Upper Extremity Lymphedema   15 cm Proximal to Olecranon Process 32 cm    10 cm Proximal to Olecranon Process 31 cm    Olecranon Process 23 cm      Left Upper Extremity Lymphedema   15 cm Proximal to Olecranon Process 33 cm    10 cm Proximal to Olecranon Process 32.8 cm    Olecranon Process 23 cm    15 cm Proximal to Ulnar Styloid Process 20.4 cm    10 cm Proximal to Ulnar Styloid Process 18.5 cm    Just Proximal to Ulnar Styloid Process 14.5 cm            Circumference measure -  has little increase bilateral upper arm but L increase compare to the R dominant UE -and from evaluation date She do report she feels heavy or full feeling in upper arm and chest /scapula the last 2 visits Did some massage -but demo pt and review MLD - stimulate L axillary ln , then pump upper arm distal to proximal over shoulder ,inside of upper arm to outside arm and then repeat upper arm distal to prox over shoulder  8-10 reps each -daily  Light - reviewed again with pt  Jovipak unilateral breast pad to wear under camisole , compression bra or binder - daily for few days and assess if feeling less full or congested  las t time  But she report she has appt to get measure for over the counter compression sleeve to use with high risk activites  Over the counter compression sleeve for flying  Review deep abdominal breathing with light compression at end - review multiple times  Pt to do 2 x day in supine  Ed pt on anatomy for lymphedema again -and that this OT never had any pt that had mastectomy and ln removed - had lymphedema in the head  Pt to keep her appt with ENT to assess her  feeling of fluids in the ears -and throat She did go the the ED reason ly and had CT that did not show Cancer and that gave her peace of mind  Pt to  cont at home and follow up as needed in 4 wks  if needed                    OT Education - 02/24/22 1717     Education Details Lymphedema symptoms, treatmen and precautions    Person(s) Educated Patient    Methods Explanation;Demonstration;Tactile cues;Verbal cues;Handout    Comprehension Verbal cues required;Returned demonstration;Verbalized understanding                 OT Long Term Goals - 01/06/22 1817       OT LONG TERM GOAL #1   Title Pt to verbalize understanding of signs, symptoms , precautions of Lymphedema and wearing of preventitive sleeve    Baseline Pt read info on lymphedema on computer - in need for educations on signs, symptoms, anatomy, management    Time 12    Period Weeks    Status New    Target Date 03/31/22                   Plan - 02/24/22 1718     Clinical Impression Statement Pt refer to OT with diagnosis of lymphedema. Pt had L mastectomy done June 22- and 2 ln removed , not radiation. Pt also present with bilateral below the knee compression stockings with zip that she wear daily and doing pump- has long standing vascular history, cellulitis and ulcer history. Pt was evaluated  6 wks ago  ago and to cont with her same routine for LE lymphedema. She reports the last 2 visits  that she do feel heaviness in her upper arm and swelling under her L arm and shoulder blade and is doing some massage daily for that. Pt was ed on correct MLD for L upper arm lymphedem, as well as use of jovipak unilateral breast pad to wear as needed under sports bra or binder. Review with pt Deep abdominal breathing with stimulation of deep abdominal ln. Her risk for lymphedema is low but report some heavy, full feeling - pt is getting measure Thursday for over the counter sleeve to wear with high risk activties.  Recommend pt to see her ENT about her feeling of fluid in the ears. Her breast CA with mastectomy cannot move quadrants and move lymph into her head. Recommend for pt to also do something for her self every day - like her painting or writing. Pt feels better after session, more peace of mind and will cont at home to monitor - to follow up with me after she had her L UE sleeve for 3-4 wks    OT Occupational Profile and History Problem Focused Assessment - Including review of records relating to presenting problem    Occupational performance deficits (Please refer to evaluation for details): ADL's;IADL's;Leisure    Body Structure / Function / Physical Skills Edema;ADL    Rehab Potential Good    Clinical Decision Making Limited treatment options, no task modification necessary    Comorbidities Affecting Occupational Performance: None    Modification or Assistance to Complete Evaluation  No modification of tasks or assist necessary to complete eval    OT Frequency Monthly    OT Duration 8 weeks    OT Treatment/Interventions Self-care/ADL training;Patient/family education;Compression bandaging;Manual lymph drainage    Consulted and Agree with Plan of Care Patient             Patient will benefit from skilled therapeutic intervention in order to improve the following deficits and impairments:  Body Structure / Function / Physical Skills: Edema, ADL       Visit Diagnosis: Lymphedema, not elsewhere classified    Problem List Patient Active Problem List   Diagnosis Date Noted   Venous ulcer of ankle, unspecified laterality ([place]) 12/24/2021   Bilateral lower extremity edema 12/17/2021   Pre-op evaluation 06/02/2021   Right wrist pain 02/28/2021   Skin rash 02/28/2021   Ascending aorta dilatation ([place]) 01/10/2021   Left-sided chest pain 01/10/2021   Genetic testing 10/30/2020   Malignant neoplasm of overlapping sites of left breast in female, estrogen receptor positive ([place]) 10/22/2020    Family history of breast cancer    Family history of thyroid cancer    Insertional Achilles tendinopathy 08/28/2020   Breast cancer, left breast ([place]) 08/01/2020   Right carotid bruit 06/05/2020   Chest pressure 06/03/2020   Left Achilles tendinitis 09/01/2019   Low back pain 08/29/2019   Lumbosacral spondylosis without myelopathy 08/29/2019   Calcaneal spur 08/17/2019   Contusion of knee 10/04/2018   Varicose veins of bilateral lower extremities with pain 03/30/2018   Lymphedema 03/30/2018   Dizziness 02/02/2017   Situational depression 12/18/2016   Vitamin D deficiency 10/11/2016   Fatigue 08/12/2016   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 04/14/2016   Essential hypertension 12/17/2015   Dermatitis of external ear 12/17/2015   Corn of foot 05/16/2015   Advanced care planning/counseling discussion 10/30/2014   Neck pain on right side 06/25/2014   Primary osteoarthritis of left knee 08/17/2013   Oropharyngeal dysphagia 11/15/2012   Stressful life events affecting family and household 03/30/2012   Medicare annual wellness visit, subsequent 03/30/2012   History of basal cell carcinoma    Osteoarthritis    Osteopenia    Hypothyroidism    Glaucoma    Chronic venous insufficiency     Rosalyn Gess, OTR/L,CLT 02/24/2022, 5:25 PM  [facility] [address], [place], [address] Phone: [phone]   Fax:  [phone]  Name: RINNAH PEPPEL MRN: [id number] Date of Birth: 1935/05/15

## 2022-02-25 ENCOUNTER — Encounter (HOSPITAL_COMMUNITY): Payer: Medicare Other

## 2022-02-25 DIAGNOSIS — Z20822 Contact with and (suspected) exposure to covid-19: Secondary | ICD-10-CM | POA: Diagnosis not present

## 2022-02-26 ENCOUNTER — Other Ambulatory Visit: Payer: Self-pay

## 2022-02-26 ENCOUNTER — Ambulatory Visit: Payer: Medicare Other | Attending: Family Medicine

## 2022-02-26 DIAGNOSIS — M6281 Muscle weakness (generalized): Secondary | ICD-10-CM | POA: Insufficient documentation

## 2022-02-26 DIAGNOSIS — M25562 Pain in left knee: Secondary | ICD-10-CM | POA: Diagnosis not present

## 2022-02-26 DIAGNOSIS — G8929 Other chronic pain: Secondary | ICD-10-CM | POA: Insufficient documentation

## 2022-02-26 DIAGNOSIS — R262 Difficulty in walking, not elsewhere classified: Secondary | ICD-10-CM | POA: Diagnosis not present

## 2022-02-26 DIAGNOSIS — H401123 Primary open-angle glaucoma, left eye, severe stage: Secondary | ICD-10-CM | POA: Diagnosis not present

## 2022-02-26 NOTE — Therapy (Signed)
[facility] [address], [place], [address] Phone: [phone]   Fax:  [phone]  Physical Therapy Treatment  Patient Details  Name: Beth Rodgers MRN: [id number] Date of Birth: 07/30/1935 Referring Provider (PT): Tamala Julian, [place]   Encounter Date: 02/26/2022   PT End of Session - 02/26/22 1545     Visit Number 9    Number of Visits 17    Date for PT Re-Evaluation 03/27/22    PT Start Time 2122    PT Stop Time 1632    PT Time Calculation (min) 47 min    Activity Tolerance Patient tolerated treatment well    Behavior During Therapy [facility] for tasks assessed/performed             Past Medical History:  Diagnosis Date   Basal cell carcinoma ([place])    txted with MOHs in past   Cellulitis 08/10/2018   Chronic venous insufficiency    with varicose veins   Family history of breast cancer    Family history of thyroid cancer    GERD (gastroesophageal reflux disease)    Glaucoma    History  of basal cell carcinoma    left nare/BREAST CANCER   History of chicken pox    Hypertension    Hypoglycemia    Hypothyroid    Osteoarthritis    back pain, L knee pain   Osteopenia 06/2009   DEXA 11/2015: T -2.3 hip, -2.2 spine, on longterm bisphosphonate/evista   Psoriasis    Pyogenic granuloma 04/16/2016    Past Surgical History:  Procedure Laterality Date   BASAL CELL CARCINOMA EXCISION     located on face   BREAST BIOPSY Right    core done ing [place]?   BREAST BIOPSY Left 08/16/2020   3 area bx 4:00 X, IMC, 6:00Q IMC, LN hydro #3-benign   BREAST LUMPECTOMY Left 09/11/2020   two areas of [facility]   CATARACT EXTRACTION     bilateral   DEXA  06/2009   T score Spine: -1.8, hip -2.0   EXCISION OF BREAST BIOPSY Left 09/11/2020   Procedure: EXCISION OF BREAST BIOPSY;  Surgeon: [name], MD;  Location: [facility] ORS;  Service: General;  Laterality: Left;   Foam sclerotherapy Bilateral 09/2015    Reed P[place], [place]   MASTECTOMY W/ SENTINEL NODE BIOPSY Left 06/25/2021   Procedure: MASTECTOMY WITH SENTINEL LYMPH NODE BIOPSY;  Surgeon: [name], MD;  Location: [facility] ORS;  Service: General;  Laterality: Left;   NASAL RECONSTRUCTION  2005   for [place] L nare s/p Mohs   nuclear stress test  2014   normal in [place] BX Left 09/11/2020   Procedure: PARTIAL MASTECTOMY WITH NEEDLE LOCALIZATION AND AXILLARY SENTINEL LYMPH NODE [place];  Surgeon: [name], MD;  Location: [facility] ORS;  Service: General;  Laterality: Left;   RADIOFREQUENCY ABLATION Left 01/2012   remnant L G saphenous vein below knee   RADIOFREQUENCY ABLATION Right 02/2013   RLE vein ablation    VEIN LIGATION AND STRIPPING  remote   bilateral G saphenous veins    There were no vitals filed for this visit.   Subjective Assessment - 02/26/22 1548     Subjective L knee is doing much better, no pain currently. Feels like the burning sensation at her Heel is from the veins. 2/10 L Achilles/heel burning currently. Was better after last session. Better able to  walk in her garden and prune her tree.    Pertinent History Pt is a pleasant 87 y.o. female referred to OP PT for L knee pain. PMH includes: HTN, history of breast cancer, chronic venous insufficiency, history of basal cell carcinoma, GERD. Pt  reports having bad arthritis in L knee. Pain is worsened with quick movements, too much walking, maintaining knee in a certain position for too long, turning in her sleep hurts. Pain described as sharp and deep and is denying stiffness due to being active with HEP from previous therapy she just finished earlier this week. Pain is improved with consistent exercise and rest. Pt's goal is to improve pain and mobility in L knee and balance/walking tolerance for community ADL's.    Limitations Standing;Walking    Patient Stated Goals Self management  of L knee pain and improving wlaking ADL's    Currently in Pain? Yes    Pain Score 2     Pain Location Heel    Pain Orientation Left    Pain Descriptors / Indicators Burning    Pain Onset More than a month ago                                        PT Education - 02/26/22 1604     Education Details ther-ex    Person(s) Educated Patient    Methods Explanation;Demonstration;Tactile cues;Verbal cues    Comprehension Returned demonstration;Verbalized understanding              Objective     No latex band allergies   B varicose veins legs Osteopenia     L heel pain Hx of L mastectomy on June 25, 2021 and had home health PT for recovery.      Started using a SPC 5 months ago when ambulating by herself. Furniture walk at home.    Medbridge Access Code [email or web address]     Therapeutic exercise   Hooklying transversus abdominis contraction 10x3 with 5 second holds   Hooklying SLR L hip flexion 4x  Hooklying manually reisted hip abduction isometrics, leg straight              L 10x3 with 5 second holds   Hooklying glute max squeeze 10x5 seconds for 3 sets  Increased L heel pain during gait  Hooklying lower trunk rotation 10x4 each LE  Decreased L heel pain during gait  Seated trunk rotation 10x3 each side  Seated B scapular retraction 10x5 seconds for 2 sets to promote thoracic extension and decrease stress to low back   Improved exercise technique, movement at target joints, use of target muscles after mod verbal, visual, tactile cues.       Response to treatment Decreased L Achilles pain to 1/10 and no L knee pain during gait after session.         Clinical Impression Worked on decreasing L heel pain to promote ability to ambulate and decrease L knee compensation to promote progress with L knee rehab. Decreased L heel pain to 1/10 after session after treatment to promote lumbar mobility and decrease stress to L LE nerves.  Improved ability to ambulate with less difficulty observed after session. Pt will benefit from continued skilled physical therapy services to decrease pain, improve strength and function.    PT Short Term Goals - 01/30/22 0858       PT SHORT TERM GOAL #1  Title Pt will be independent with HEP to improve L knee AROM and global strength to improve walking tasks    Baseline 01/29/22: Initiated    Time 4    Period Weeks    Status New    Target Date 02/26/22               PT Long Term Goals - 01/30/22 0859       PT LONG TERM GOAL #1   Title Pt will improve knee FOTO to target score to display clinically significant improvement in functional mobility.    Baseline 01/29/22: Next session    Time 8    Period Weeks    Status New    Target Date 03/26/22      PT LONG TERM GOAL #2   Title Pt will improve L Knee extension by 5 deg or greater to demonstrate clinically significant improvement for improved gait mechanics.    Baseline 01/29/2022: L knee lacking 7 deg from TKE. R knee extension lacking 2 deg from TKE.    Time 8    Period Weeks    Status New    Target Date 03/26/22      PT LONG TERM GOAL #3   Title Pt will improve 2 min walk test to 70' for age matched norms to improve community ambulation tasks and reduced risk of falls.    Baseline 01/29/22: 316'    Time 8    Period Weeks    Status New    Target Date 03/26/22      PT LONG TERM GOAL #4   Title Pt will improve 5xSTS  to </= 12 sec to demonstrate clinically significant improvement in LE strength.    Baseline 01/29/22: 18.13 sec with UE use on arm rests    Time 8    Period Weeks    Status New    Target Date 03/26/22                   Plan - 02/26/22 1544     Clinical Impression Statement Worked on decreasing L heel pain to promote ability to ambulate and decrease L knee compensation to promote progress with L knee rehab. Decreased L heel pain to 1/10 after session after treatment to promote lumbar mobility and  decrease stress to L LE nerves. Improved ability to ambulate with less difficulty observed after session. Pt will benefit from continued skilled physical therapy services to decrease pain, improve strength and function.    Personal Factors and Comorbidities Age;Comorbidity 3+;Time since onset of injury/illness/exacerbation;Fitness;Past/Current Experience    Comorbidities Osteopenia, chronic venous insufficiency,Hx of basal cell CA S/P lumpectomy, HTN    Examination-Activity Limitations Stand;Locomotion Level;Squat;Stairs;Carry;Lift;Bed Mobility;Transfers    Examination-Participation Restrictions Community Activity    Stability/Clinical Decision Making Stable/Uncomplicated    Rehab Potential Fair    Clinical Impairments Affecting Rehab Potential Chronicity of condition, age, fitness level    PT Frequency 2x / week    PT Duration 8 weeks    PT Treatment/Interventions Gait training;Functional mobility training;Therapeutic activities;Therapeutic exercise;Neuromuscular re-education;Patient/family education;Manual techniques;ADLs/Self Care Home Management;Aquatic Therapy;Cryotherapy;Electrical Stimulation;Moist Heat;Traction;Passive range of motion    PT Next Visit Plan Reassess HEP, address knee extension AROM limitations    PT Home Exercise Plan Access Code [place] and Agree with Plan of Care Patient             Patient will benefit from skilled therapeutic intervention in order to improve the following deficits and impairments:  Postural  dysfunction, Decreased strength, Improper body mechanics, Pain, Difficulty walking, Abnormal gait, Hypomobility, Decreased activity tolerance, Decreased mobility, Decreased range of motion  Visit Diagnosis: Chronic pain of left knee  Difficulty in walking, not elsewhere classified  Muscle weakness (generalized)     Problem List Patient Active Problem List   Diagnosis Date Noted   Venous ulcer of ankle, unspecified laterality ([place])  12/24/2021   Bilateral lower extremity edema 12/17/2021   Pre-op evaluation 06/02/2021   Right wrist pain 02/28/2021   Skin rash 02/28/2021   Ascending aorta dilatation ([place]) 01/10/2021   Left-sided chest pain 01/10/2021   Genetic testing 10/30/2020   Malignant neoplasm of overlapping sites of left breast in female, estrogen receptor positive ([place]) 10/22/2020   Family history of breast cancer    Family history of thyroid cancer    Insertional Achilles tendinopathy 08/28/2020   Breast cancer, left breast ([place]) 08/01/2020   Right carotid bruit 06/05/2020   Chest pressure 06/03/2020   Left Achilles tendinitis 09/01/2019   Low back pain 08/29/2019   Lumbosacral spondylosis without myelopathy 08/29/2019   Calcaneal spur 08/17/2019   Contusion of knee 10/04/2018   Varicose veins of bilateral lower extremities with pain 03/30/2018   Lymphedema 03/30/2018   Dizziness 02/02/2017   Situational depression 12/18/2016   Vitamin D deficiency 10/11/2016   Fatigue 08/12/2016   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 04/14/2016   Essential hypertension 12/17/2015   Dermatitis of external ear 12/17/2015   Corn of foot 05/16/2015   Advanced care planning/counseling discussion 10/30/2014   Neck pain on right side 06/25/2014   Primary osteoarthritis of left knee 08/17/2013   Oropharyngeal dysphagia 11/15/2012   Stressful life events affecting family and household 03/30/2012   Medicare annual wellness visit, subsequent 03/30/2012   History of basal cell carcinoma    Osteoarthritis    Osteopenia    Hypothyroidism    Glaucoma    Chronic venous insufficiency    Joneen Boers PT, DPT  02/26/2022, 4:36 PM  [facility] [address], [place], [address] Phone: [phone]   Fax:  [phone]  Name: Beth Rodgers MRN: [id number] Date of Birth: [birth year]

## 2022-02-27 DIAGNOSIS — Z20822 Contact with and (suspected) exposure to covid-19: Secondary | ICD-10-CM | POA: Diagnosis not present

## 2022-03-02 ENCOUNTER — Ambulatory Visit: Payer: Medicare Other

## 2022-03-02 ENCOUNTER — Other Ambulatory Visit: Payer: Self-pay

## 2022-03-02 DIAGNOSIS — M6281 Muscle weakness (generalized): Secondary | ICD-10-CM | POA: Diagnosis not present

## 2022-03-02 DIAGNOSIS — M25562 Pain in left knee: Secondary | ICD-10-CM

## 2022-03-02 DIAGNOSIS — G8929 Other chronic pain: Secondary | ICD-10-CM

## 2022-03-02 DIAGNOSIS — R262 Difficulty in walking, not elsewhere classified: Secondary | ICD-10-CM

## 2022-03-02 NOTE — Therapy (Signed)
Mobile °[facility] °[address] °[place], [place], [address] °Phone: [phone]   Fax:  [phone] ° °Physical Therapy Treatment °And °Progress Report °(01/29/2022 - 03/02/2022) ° °Patient Details  °Name: Beth Rodgers °MRN: [id number] °Date of Birth: 02/21/1935 °Referring Provider (PT): Benjamin Smith, PA ° ° °Encounter Date: 03/02/2022 ° ° PT End of Session - 03/02/22 1421   ° ° Visit Number 12   ° Number of Visits 17   ° Date for PT Re-Evaluation 03/27/22   ° PT Start Time 1421   ° PT Stop Time 1503   ° PT Time Calculation (min) 42 min   ° Activity Tolerance Patient tolerated treatment well   ° Behavior During Therapy WFL for tasks assessed/performed   ° °  °  ° °  ° ° °Past Medical History:  °Diagnosis Date  ° Basal cell carcinoma (BCC)   ° txted with MOHs in past  ° Cellulitis 08/10/2018  ° Chronic venous insufficiency   ° with varicose veins  ° Family history of breast cancer   ° Family history of thyroid cancer   ° GERD (gastroesophageal reflux disease)   ° Glaucoma   ° History  of basal cell carcinoma   ° left nare/BREAST CANCER  ° History of chicken pox   ° Hypertension   ° Hypoglycemia   ° Hypothyroid   ° Osteoarthritis   ° back pain, L knee pain  ° Osteopenia 06/2009  ° DEXA 11/2015: T -2.3 hip, -2.2 spine, on longterm bisphosphonate/evista  ° Psoriasis   ° Pyogenic granuloma 04/16/2016  ° ° °Past Surgical History:  °Procedure Laterality Date  ° BASAL CELL CARCINOMA EXCISION    ° located on face  ° BREAST BIOPSY Right   ° core done ing [place] 2000?  ° BREAST BIOPSY Left 08/16/2020  ° 3 area bx 4:00 X, IMC, 6:00Q IMC, LN hydro #3-benign  ° BREAST LUMPECTOMY Left 09/11/2020  ° two areas of IMC  ° CATARACT EXTRACTION    ° bilateral  ° DEXA  06/2009  ° T score Spine: -1.8, hip -2.0  ° EXCISION OF BREAST BIOPSY Left 09/11/2020  ° Procedure: EXCISION OF BREAST BIOPSY;  Surgeon: Cintron-Diaz, Edgardo, MD;  Location: [facility] OR[facility];  Service: General;  Laterality: Left;   ° Foam sclerotherapy Bilateral 09/2015  ° [place], [place]  ° MASTECTOMY W/ SENTINEL NODE BIOPSY Left 06/25/2021  ° Procedure: MASTECTOMY WITH SENTINEL LYMPH NODE BIOPSY;  Surgeon: Cintron-Diaz, Edgardo, MD;  Location: [facility];  Service: General;  Laterality: Left;  ° NASAL RECONSTRUCTION  2005  ° for BCC L nare s/p Mohs  ° nuclear stress test  2014  ° normal in [place]  ° PARTIAL MASTECTOMY WITH NEEDLE LOCALIZATION AND AXILLARY SENTINEL LYMPH NODE BX Left 09/11/2020  ° Procedure: PARTIAL MASTECTOMY WITH NEEDLE LOCALIZATION AND AXILLARY SENTINEL LYMPH NODE BX;  Surgeon: Cintron-Diaz, Edgardo, MD;  Location: [facility];  Service: General;  Laterality: Left;  ° RADIOFREQUENCY ABLATION Left 01/2012  ° remnant L G saphenous vein below knee  ° RADIOFREQUENCY ABLATION Right 02/2013  ° RLE vein ablation   ° VEIN LIGATION AND STRIPPING  remote  ° bilateral G saphenous veins  ° ° °There were no vitals filed for this visit. ° ° Subjective Assessment - 03/02/22 1423   ° ° Subjective Feels tired. Was running around earlier. L knee is 1/10 currently. The Achilles is improving, no pain currently.   ° Pertinent History Pt is a pleasant 86   y.o. female referred to OP PT for L knee pain. PMH includes: HTN, history of breast cancer, chronic venous insufficiency, history of basal cell carcinoma, GERD. Pt  reports having bad arthritis in L knee. Pain is worsened with quick movements, too much walking, maintaining knee in a certain position for too long, turning in her sleep hurts. Pain described as sharp and deep and is denying stiffness due to being active with HEP from previous therapy she just finished earlier this week. Pain is improved with consistent exercise and rest. Pt's goal is to improve pain and mobility in L knee and balance/walking tolerance for community ADL's.    Limitations Standing;Walking    Patient Stated Goals Self management of L knee pain and improving wlaking ADL's    Currently in Pain? Yes    Pain  Score 1     Pain Location Knee    Pain Onset More than a month ago                                        PT Education - 03/02/22 1427     Education Details ther-ex    Person(s) Educated Patient    Methods Explanation;Demonstration;Tactile cues;Verbal cues    Comprehension Returned demonstration;Verbalized understanding              Objective     No latex band allergies   B varicose veins legs Osteopenia     L heel pain Hx of L mastectomy on June 25, 2021 and had home health PT for recovery.      Started using a SPC 5 months ago when ambulating by herself. Furniture walk at home.    Med[employer] Access Code [email or web address]     Therapeutic exercise   Seated B scapular retraction 10x5 seconds for 3 sets to promote thoracic extension and decrease stress to low back  Seated L knee extension AROM:  - 10 degrees   Gait x 2 min, no SPC 300 ft  Seated trunk rotation 10x3 each side  Sit <> stand 5x quickly 21 seconds. Pt however maybe tired from the 2 minute walk from previous exercise. Pt also states being tired today.  14 seconds second try after resting after manual therapy. L Achilles discomfort however. Ice pack provided for comfort.   Eases off with ice pack per pt.       Improved exercise technique, movement at target joints, use of target muscles after mod verbal, visual, tactile cues.     Manual therapy    Seated STM L lateral hamstrings and vastus lateralis muscles to decrease tension   Seated STM B lumbar paraspinal muscles to decrease tension        Response to treatment Fair tolerance to today's session.          Clinical Impression Pt demonstrates overall decreasing L knee pain. Recent recurrence of L Achilles pain which improves with treatment to decrease lumbar extension pressure. Similar gait distance ambulated today compared to initial eval. Pt however did not use her SPC today which may have affected her gait  distance.  Improved 5 times sit <> stand with B UE assist to 14 seconds during second try. Achilles discomfort however afterwards which eased off with ice and decreasing lumbar extension pressure. Fair tolerance to today's session. Pt will benefit from continued skilled physical therapy services to decrease pain, improve strength and function.  PT Short Term Goals - 01/30/22 0858   ° °  ° PT SHORT TERM GOAL #1  ° Title Pt will be independent with HEP to improve L knee AROM and global strength to improve walking tasks   ° Baseline 01/29/22: Initiated   ° Time 4   ° Period Weeks   ° Status New   ° Target Date 02/26/22   ° °  °  ° °  ° ° ° ° PT Long Term Goals - 03/02/22 1435   ° °  ° PT LONG TERM GOAL #1  ° Title Pt will improve knee FOTO to target score to display clinically significant improvement in functional mobility.   ° Baseline 01/29/22: Next session   ° Time 8   ° Period Weeks   ° Status New   ° Target Date 03/26/22   °  ° PT LONG TERM GOAL #2  ° Title Pt will improve L Knee extension by 5 deg or greater to demonstrate clinically significant improvement for improved gait mechanics.   ° Baseline 01/29/2022: L knee lacking 7 deg from TKE. R knee extension lacking 2 deg from TKE.   ° Time 8   ° Period Weeks   ° Status New   ° Target Date 03/26/22   °  ° PT LONG TERM GOAL #3  ° Title Pt will improve 2 min walk test to 495' for age matched norms to improve community ambulation tasks and reduced risk of falls.   ° Baseline 01/29/22: 316'; 300 ft without use of SPC. Pt states L Achilles bothered her a little bit.  (03/02/2022)   ° Time 8   ° Period Weeks   ° Status New   ° Target Date 03/26/22   °  ° PT LONG TERM GOAL #4  ° Title Pt will improve 5xSTS  to </= 12 sec to demonstrate clinically significant improvement in LE strength.   ° Baseline 01/29/22: 18.13 sec with UE use on arm rests; 14 seconds with B UE assist wth L Achilles discomfort. (03/02/2022)   ° Time 8   ° Period Weeks   ° Status Partially Met   ° Target  Date 03/26/22   ° °  °  ° °  ° ° ° ° ° ° ° ° Plan - 03/02/22 1427   ° ° Clinical Impression Statement Pt demonstrates overall decreasing L knee pain. Recent recurrence of L Achilles pain which improves with treatment to decrease lumbar extension pressure. Similar gait distance ambulated today compared to initial eval. Pt however did not use her SPC today which may have affected her gait distance.  Improved 5 times sit <> stand with B UE assist to 14 seconds during second try. Achilles discomfort however afterwards which eased off with ice and decreasing lumbar extension pressure. Fair tolerance to today's session. Pt will benefit from continued skilled physical therapy services to decrease pain, improve strength and function.   ° Personal Factors and Comorbidities Age;Comorbidity 3+;Time since onset of injury/illness/exacerbation;Fitness;Past/Current Experience   ° Comorbidities Osteopenia, chronic venous insufficiency,Hx of basal cell CA S/P lumpectomy, HTN   ° Examination-Activity Limitations Stand;Locomotion Level;Squat;Stairs;Carry;Lift;Bed Mobility;Transfers   ° Examination-Participation Restrictions Community Activity   ° Stability/Clinical Decision Making Stable/Uncomplicated   ° Rehab Potential Fair   ° Clinical Impairments Affecting Rehab Potential Chronicity of condition, age, fitness level   ° PT Frequency 2x / week   ° PT Duration 8 weeks   ° PT Treatment/Interventions Gait training;Functional mobility training;Therapeutic activities;Therapeutic exercise;Neuromuscular   re-education;Patient/family education;Manual techniques;ADLs/Self Care Home Management;Aquatic Therapy;Cryotherapy;Electrical Stimulation;Moist Heat;Traction;Passive range of motion    PT Next Visit Plan Reassess HEP, address knee extension AROM limitations    PT Home Exercise Plan Access Code [place] and Agree with Plan of Care Patient             Patient will benefit from skilled therapeutic intervention in order  to improve the following deficits and impairments:  Postural dysfunction, Decreased strength, Improper body mechanics, Pain, Difficulty walking, Abnormal gait, Hypomobility, Decreased activity tolerance, Decreased mobility, Decreased range of motion  Visit Diagnosis: Chronic pain of left knee  Difficulty in walking, not elsewhere classified  Muscle weakness (generalized)     Problem List Patient Active Problem List   Diagnosis Date Noted   Venous ulcer of ankle, unspecified laterality ([place]) 12/24/2021   Bilateral lower extremity edema 12/17/2021   Pre-op evaluation 06/02/2021   Right wrist pain 02/28/2021   Skin rash 02/28/2021   Ascending aorta dilatation ([place]) 01/10/2021   Left-sided chest pain 01/10/2021   Genetic testing 10/30/2020   Malignant neoplasm of overlapping sites of left breast in female, estrogen receptor positive ([place]) 10/22/2020   Family history of breast cancer    Family history of thyroid cancer    Insertional Achilles tendinopathy 08/28/2020   Breast cancer, left breast ([place]) 08/01/2020   Right carotid bruit 06/05/2020   Chest pressure 06/03/2020   Left Achilles tendinitis 09/01/2019   Low back pain 08/29/2019   Lumbosacral spondylosis without myelopathy 08/29/2019   Calcaneal spur 08/17/2019   Contusion of knee 10/04/2018   Varicose veins of bilateral lower extremities with pain 03/30/2018   Lymphedema 03/30/2018   Dizziness 02/02/2017   Situational depression 12/18/2016   Vitamin D deficiency 10/11/2016   Fatigue 08/12/2016   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 04/14/2016   Essential hypertension 12/17/2015   Dermatitis of external ear 12/17/2015   Corn of foot 05/16/2015   Advanced care planning/counseling discussion 10/30/2014   Neck pain on right side 06/25/2014   Primary osteoarthritis of left knee 08/17/2013   Oropharyngeal dysphagia 11/15/2012   Stressful life events affecting family and household 03/30/2012    Medicare annual wellness visit, subsequent 03/30/2012   History of basal cell carcinoma    Osteoarthritis    Osteopenia    Hypothyroidism    Glaucoma    Chronic venous insufficiency    Joneen Boers PT, DPT  03/02/2022, 4:50 PM  [facility] [address], [place], [address] Phone: [phone]   Fax:  [phone]  Name: Beth Rodgers MRN: [id number] Date of Birth: 1935-01-15

## 2022-03-03 ENCOUNTER — Ambulatory Visit (INDEPENDENT_AMBULATORY_CARE_PROVIDER_SITE_OTHER): Payer: Medicare Other | Admitting: Psychology

## 2022-03-03 DIAGNOSIS — F4323 Adjustment disorder with mixed anxiety and depressed mood: Secondary | ICD-10-CM | POA: Diagnosis not present

## 2022-03-03 NOTE — Progress Notes (Signed)
[place] Counselor/Therapist Progress Note ? ?Patient ID: Beth Rodgers, MRN: [id number]   ? ?Date: 03/03/22 ? ?Time Spent: 1:01 pm - 1:51 pm : 50 Minutes ? ?Treatment Type: Individual Therapy. ? ?Reported Symptoms: Rumination, anxiety.   ? ?Mental Status Exam: ?Appearance:  NA     ?Behavior: Appropriate  ?Motor: NA  ?Speech/Language:  Clear and Coherent and Normal Rate  ?Affect: Congruent  ?Mood: dysthymic  ?Thought process: normal  ?Thought content:   WNL  ?Sensory/Perceptual disturbances:   WNL  ?Orientation: oriented to person, place, time/date, and situation  ?Attention: Good  ?Concentration: Good  ?Memory: WNL  ?Fund of knowledge:  Good  ?Insight:   Good  ?Judgment:  Good  ?Impulse Control: Good  ? ?Risk Assessment: ?Danger to Self:  No ?Self-injurious Behavior: No ?Danger to Others: No ?Duty to Warn:no ?Physical Aggression / Violence:No  ?Access to Firearms a concern: No  ?Gang Involvement:No  ? ?Subjective:  ? ?Beth Rodgers participated from home, via phone, and consented to treatment. Therapist participated from office. We met online due to Beth Rodgers pandemic. Beth Rodgers reviewed the events of the past two weeks. Beth Rodgers noted not seeing her son in about two weeks and noted her worry regarding her son. We explored this, during the session, and processed her concerns. Beth Rodgers noted sadness regarding who her children have become.  We continued to explore this during the session.  Beth Rodgers noted increasing anxiety regarding finances in preparation for end of life due to her husband's age.  She noted her attempts to communicate about this in the past for her husband to no avail.  We explored this during the session and worked on identifying ways to address this going forward we engaged in problem solving and reviewed communication during the session.  Therapist encouraged Beth Rodgers to identify her goals, specific concerns, and ways to communicate them with her husband.  Beth Rodgers was engaged and motivated during the session  and expressed commitment towards her goals.  She noted her future attempts and tried to resolve these worries.  Therapist praised Scientist, research (physical sciences) for identifying areas of control and working hopefully within those.  Therapist provided supportive therapy. ?  ?Interventions: Interpersonal ? ?Diagnosis: Adjustment disorder with mixed anxiety and depressed mood ? ?Plan: Patient is to use CBT, BA, Problem-solving, mindfulness and coping skills to help manage decrease symptoms associated with their diagnosis. (Target date: 08/16/22) ?  ?Long-term goal:   ?Reduce overall level, frequency, and intensity of the feelings of depression and anxiety evidenced by   decreased sadness, rumination, lethargy, and interpersonal stressors  from 6 to 7 days/week to 0 to 1 days/week per client report for at least 3 consecutive months. ? ?Short-term goal:  ?Verbally express understanding of the relationship between feelings of depression, anxiety and their impact on thinking patterns and behaviors. ? ?Verbalize an understanding of the role that distorted thinking plays in creating fears, excessive worry, and ruminations. ? ?Engage in enjoyable activities on a consistent basis. ? ? ?Beth Irish, LCSW ?

## 2022-03-04 ENCOUNTER — Ambulatory Visit: Payer: Medicare Other

## 2022-03-04 ENCOUNTER — Encounter: Payer: Medicare Other | Admitting: Occupational Therapy

## 2022-03-04 ENCOUNTER — Other Ambulatory Visit: Payer: Self-pay

## 2022-03-04 DIAGNOSIS — R262 Difficulty in walking, not elsewhere classified: Secondary | ICD-10-CM | POA: Diagnosis not present

## 2022-03-04 DIAGNOSIS — M25562 Pain in left knee: Secondary | ICD-10-CM | POA: Diagnosis not present

## 2022-03-04 DIAGNOSIS — M6281 Muscle weakness (generalized): Secondary | ICD-10-CM | POA: Diagnosis not present

## 2022-03-04 DIAGNOSIS — G8929 Other chronic pain: Secondary | ICD-10-CM

## 2022-03-04 NOTE — Therapy (Signed)
[place] [address], [place], [address] Phone: [phone]   Fax:  [phone]  Physical Therapy Treatment  Patient Details  Name: Beth Rodgers MRN: [id number] Date of Birth: [birth year] Referring Provider (PT): Tamala Julian, [place]   Encounter Date: 03/04/2022   PT End of Session - 03/04/22 1552     Visit Number 13    Number of Visits 17    Date for PT Re-Evaluation 03/27/22    PT Start Time 6962    PT Stop Time 1633    PT Time Calculation (min) 41 min    Activity Tolerance Patient tolerated treatment well    Behavior During Therapy [facility] for tasks assessed/performed             Past Medical History:  Diagnosis Date   Basal cell carcinoma ([place])    txted with MOHs in past   Cellulitis 08/10/2018   Chronic venous insufficiency    with varicose veins   Family history of breast cancer    Family history of thyroid cancer    GERD (gastroesophageal reflux disease)    Glaucoma    History  of basal cell carcinoma    left nare/BREAST CANCER   History of chicken pox    Hypertension    Hypoglycemia    Hypothyroid    Osteoarthritis    back pain, L knee pain   Osteopenia 06/2009   DEXA 11/2015: T -2.3 hip, -2.2 spine, on longterm bisphosphonate/evista   Psoriasis    Pyogenic granuloma 04/16/2016    Past Surgical History:  Procedure Laterality Date   BASAL CELL CARCINOMA EXCISION     located on face   BREAST BIOPSY Right    core done ing [place]?   BREAST BIOPSY Left 08/16/2020   3 area bx 4:00 X, IMC, 6:00Q IMC, LN hydro #3-benign   BREAST LUMPECTOMY Left 09/11/2020   two areas of [facility]   CATARACT EXTRACTION     bilateral   DEXA  06/2009   T score Spine: -1.8, hip -2.0   EXCISION OF BREAST BIOPSY Left 09/11/2020   Procedure: EXCISION OF BREAST BIOPSY;  Surgeon: [name], MD;  Location: [facility] ORS;  Service: General;  Laterality: Left;   Foam sclerotherapy Bilateral 09/2015    Reed Pandy, [place]   MASTECTOMY W/ SENTINEL NODE BIOPSY Left 06/25/2021   Procedure: MASTECTOMY WITH SENTINEL LYMPH NODE BIOPSY;  Surgeon: [name], MD;  Location: [facility] ORS;  Service: General;  Laterality: Left;   NASAL RECONSTRUCTION  2005   for [place] L nare s/p Mohs   nuclear stress test  2014   normal in [place] BX Left 09/11/2020   Procedure: PARTIAL MASTECTOMY WITH NEEDLE LOCALIZATION AND AXILLARY SENTINEL LYMPH NODE [place];  Surgeon: [name], MD;  Location: [facility] ORS;  Service: General;  Laterality: Left;   RADIOFREQUENCY ABLATION Left 01/2012   remnant L G saphenous vein below knee   RADIOFREQUENCY ABLATION Right 02/2013   RLE vein ablation    VEIN LIGATION AND STRIPPING  remote   bilateral G saphenous veins    There were no vitals filed for this visit.   Subjective Assessment - 03/04/22 1554     Subjective L knee is hurting a little bit. Did some exercises this morning which helped. 1.5/10 L knee pain currently. The L Achilles has improved, discomfort, but no pain in her Achilles. The pain does  not wake her up at night anymore.    Pertinent History Pt is a pleasant 87 y.o. female referred to OP PT for L knee pain. PMH includes: HTN, history of breast cancer, chronic venous insufficiency, history of basal cell carcinoma, GERD. Pt  reports having bad arthritis in L knee. Pain is worsened with quick movements, too much walking, maintaining knee in a certain position for too long, turning in her sleep hurts. Pain described as sharp and deep and is denying stiffness due to being active with HEP from previous therapy she just finished earlier this week. Pain is improved with consistent exercise and rest. Pt's goal is to improve pain and mobility in L knee and balance/walking tolerance for community ADL's.    Limitations Standing;Walking    Patient Stated Goals Self management of L knee  pain and improving wlaking ADL's    Currently in Pain? Yes    Pain Score 1     Pain Location Knee    Pain Orientation Left    Pain Onset More than a month ago                                        PT Education - 03/04/22 1600     Education Details ther-ex    Person(s) Educated Patient    Methods Explanation;Demonstration;Tactile cues;Verbal cues    Comprehension Returned demonstration;Verbalized understanding           Objective     No latex band allergies   B varicose veins legs Osteopenia     L heel pain Hx of L mastectomy on June 25, 2021 and had home health PT for recovery.      Started using a SPC 5 months ago when ambulating by herself. Furniture walk at home.    Medbridge Access Code [email or web address]     Therapeutic exercise   Seated L hip ER 9x5 seconds   Seated hip adduction isometrics, small physio ball squeeze 10x10 seconds   Standing B UE assist   Hip abduction L 2x5 seconds, then 8x2  Difficult to perform  Seated manually resisted clamshell isometrics, hips less than 90 degrees flexion   L 10x5 seconds for 3 sets  Seated manually resisted hip extension isometrics  L 10x5 seconds    Seated L leg medial rotation 5x3 with 5 second holds   Seated trunk rotation 10x each side       Improved exercise technique, movement at target joints, use of target muscles after mod verbal, visual, tactile cues.      Manual therapy    Seated STM L lateral hamstrings and vastus lateralis muscles to decrease tension    Seated STM B lumbar paraspinal muscles to decrease tension        Response to treatment Pt tolerated session well without aggravation of symptoms.          Clinical Impression Continued working on improving L glute med, and max strength as well as decreasing muscle tension to L knee to decrease L genu valgus during closed chain tasks. Continued working on improving trunk mobility and decreasing low back  extension pressure to help decrease L LE symptoms. Decreased L knee pain and improved L Achilles comfort reported after session.  Pt will benefit from continued skilled physical therapy services to decrease pain, improve strength and function.       PT Short Term Goals - 01/30/22  0858       PT SHORT TERM GOAL #1   Title Pt will be independent with HEP to improve L knee AROM and global strength to improve walking tasks    Baseline 01/29/22: Initiated    Time 4    Period Weeks    Status New    Target Date 02/26/22               PT Long Term Goals - 03/02/22 1435       PT LONG TERM GOAL #1   Title Pt will improve knee FOTO to target score to display clinically significant improvement in functional mobility.    Baseline 01/29/22: Next session    Time 8    Period Weeks    Status New    Target Date 03/26/22      PT LONG TERM GOAL #2   Title Pt will improve L Knee extension by 5 deg or greater to demonstrate clinically significant improvement for improved gait mechanics.    Baseline 01/29/2022: L knee lacking 7 deg from TKE. R knee extension lacking 2 deg from TKE.    Time 8    Period Weeks    Status New    Target Date 03/26/22      PT LONG TERM GOAL #3   Title Pt will improve 2 min walk test to 54' for age matched norms to improve community ambulation tasks and reduced risk of falls.    Baseline 01/29/22: 316'; 300 ft without use of SPC. Pt states L Achilles bothered her a little bit.  (03/02/2022)    Time 8    Period Weeks    Status New    Target Date 03/26/22      PT LONG TERM GOAL #4   Title Pt will improve 5xSTS  to </= 12 sec to demonstrate clinically significant improvement in LE strength.    Baseline 01/29/22: 18.13 sec with UE use on arm rests; 14 seconds with B UE assist wth L Achilles discomfort. (03/02/2022)    Time 8    Period Weeks    Status Partially Met    Target Date 03/26/22                   Plan - 03/04/22 1600     Clinical Impression Statement  Continued working on improving L glute med, and max strength as well as decreasing muscle tension to L knee to decrease L genu valgus during closed chain tasks. Continued working on improving trunk mobility and decreasing low back extension pressure to help decrease L LE symptoms. Decreased L knee pain and improved L Achilles comfort reported after session.  Pt will benefit from continued skilled physical therapy services to decrease pain, improve strength and function.    Personal Factors and Comorbidities Age;Comorbidity 3+;Time since onset of injury/illness/exacerbation;Fitness;Past/Current Experience    Comorbidities Osteopenia, chronic venous insufficiency,Hx of basal cell CA S/P lumpectomy, HTN    Examination-Activity Limitations Stand;Locomotion Level;Squat;Stairs;Carry;Lift;Bed Mobility;Transfers    Examination-Participation Restrictions Community Activity    Stability/Clinical Decision Making Stable/Uncomplicated    Clinical Decision Making Low    Rehab Potential Fair    Clinical Impairments Affecting Rehab Potential Chronicity of condition, age, fitness level    PT Frequency 2x / week    PT Duration 8 weeks    PT Treatment/Interventions Gait training;Functional mobility training;Therapeutic activities;Therapeutic exercise;Neuromuscular re-education;Patient/family education;Manual techniques;ADLs/Self Care Home Management;Aquatic Therapy;Cryotherapy;Electrical Stimulation;Moist Heat;Traction;Passive range of motion    PT Next Visit Plan Reassess HEP, address  knee extension AROM limitations    PT Home Exercise Plan Access Code [email or web address]    [email or web address] and Agree with Plan of Care Patient             Patient will benefit from skilled therapeutic intervention in order to improve the following deficits and impairments:  Postural dysfunction, Decreased strength, Improper body mechanics, Pain, Difficulty walking, Abnormal gait, Hypomobility, Decreased activity tolerance, Decreased mobility,  Decreased range of motion  Visit Diagnosis: Chronic pain of left knee  Difficulty in walking, not elsewhere classified  Muscle weakness (generalized)     Problem List Patient Active Problem List   Diagnosis Date Noted   Venous ulcer of ankle, unspecified laterality ([place]) 12/24/2021   Bilateral lower extremity edema 12/17/2021   Pre-op evaluation 06/02/2021   Right wrist pain 02/28/2021   Skin rash 02/28/2021   Ascending aorta dilatation ([place]) 01/10/2021   Left-sided chest pain 01/10/2021   Genetic testing 10/30/2020   Malignant neoplasm of overlapping sites of left breast in female, estrogen receptor positive ([place]) 10/22/2020   Family history of breast cancer    Family history of thyroid cancer    Insertional Achilles tendinopathy 08/28/2020   Breast cancer, left breast ([place]) 08/01/2020   Right carotid bruit 06/05/2020   Chest pressure 06/03/2020   Left Achilles tendinitis 09/01/2019   Low back pain 08/29/2019   Lumbosacral spondylosis without myelopathy 08/29/2019   Calcaneal spur 08/17/2019   Contusion of knee 10/04/2018   Varicose veins of bilateral lower extremities with pain 03/30/2018   Lymphedema 03/30/2018   Dizziness 02/02/2017   Situational depression 12/18/2016   Vitamin D deficiency 10/11/2016   Fatigue 08/12/2016   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 04/14/2016   Essential hypertension 12/17/2015   Dermatitis of external ear 12/17/2015   Corn of foot 05/16/2015   Advanced care planning/counseling discussion 10/30/2014   Neck pain on right side 06/25/2014   Primary osteoarthritis of left knee 08/17/2013   Oropharyngeal dysphagia 11/15/2012   Stressful life events affecting family and household 03/30/2012   Medicare annual wellness visit, subsequent 03/30/2012   History of basal cell carcinoma    Osteoarthritis    Osteopenia    Hypothyroidism    Glaucoma    Chronic venous insufficiency    Joneen Boers PT, DPT  03/04/2022,  4:48 PM  [place] [facility] [address], [place], [address] Phone: [phone]   Fax:  [phone]  Name: Beth Rodgers MRN: [id number] Date of Birth: 1935-01-20

## 2022-03-09 ENCOUNTER — Ambulatory Visit: Admit: 2022-03-09 | Payer: Medicare Other | Admitting: Orthopedic Surgery

## 2022-03-09 ENCOUNTER — Other Ambulatory Visit: Payer: Self-pay

## 2022-03-09 ENCOUNTER — Ambulatory Visit: Payer: Medicare Other

## 2022-03-09 DIAGNOSIS — G8929 Other chronic pain: Secondary | ICD-10-CM

## 2022-03-09 DIAGNOSIS — M6281 Muscle weakness (generalized): Secondary | ICD-10-CM | POA: Diagnosis not present

## 2022-03-09 DIAGNOSIS — M25562 Pain in left knee: Secondary | ICD-10-CM | POA: Diagnosis not present

## 2022-03-09 DIAGNOSIS — R262 Difficulty in walking, not elsewhere classified: Secondary | ICD-10-CM | POA: Diagnosis not present

## 2022-03-09 SURGERY — ARTHROPLASTY, KNEE, TOTAL
Anesthesia: Choice | Site: Knee | Laterality: Left

## 2022-03-09 NOTE — Therapy (Signed)
[place] [facility] [address], [place], [address] Phone: [phone]   Fax:  [phone]  Physical Therapy Treatment  Patient Details  Name: Beth Rodgers MRN: [id number] Date of Birth: 09/09/1935 Referring Provider (PT): Tamala Julian, [place]   Encounter Date: 03/09/2022   PT End of Session - 03/09/22 1421     Visit Number 14    Number of Visits 17    Date for PT Re-Evaluation 03/27/22    PT Start Time 1421    PT Stop Time 1501    PT Time Calculation (min) 40 min    Activity Tolerance Patient tolerated treatment well    Behavior During Therapy [facility] for tasks assessed/performed             Past Medical History:  Diagnosis Date   Basal cell carcinoma ([place])    txted with MOHs in past   Cellulitis 08/10/2018   Chronic venous insufficiency    with varicose veins   Family history of breast cancer    Family history of thyroid cancer    GERD (gastroesophageal reflux disease)    Glaucoma    History  of basal cell carcinoma    left nare/BREAST CANCER   History of chicken pox    Hypertension    Hypoglycemia    Hypothyroid    Osteoarthritis    back pain, L knee pain   Osteopenia 06/2009   DEXA 11/2015: T -2.3 hip, -2.2 spine, on longterm bisphosphonate/evista   Psoriasis    Pyogenic granuloma 04/16/2016    Past Surgical History:  Procedure Laterality Date   BASAL CELL CARCINOMA EXCISION     located on face   BREAST BIOPSY Right    core done ing [place]?   BREAST BIOPSY Left 08/16/2020   3 area bx 4:00 X, IMC, 6:00Q IMC, LN hydro #3-benign   BREAST LUMPECTOMY Left 09/11/2020   two areas of [facility]   CATARACT EXTRACTION     bilateral   DEXA  06/2009   T score Spine: -1.8, hip -2.0   EXCISION OF BREAST BIOPSY Left 09/11/2020   Procedure: EXCISION OF BREAST BIOPSY;  Surgeon: [name], MD;  Location: [facility] OR[facility];  Service: General;  Laterality: Left;   Foam sclerotherapy Bilateral 09/2015    Reed P[place], [place]   MASTECTOMY W/ SENTINEL NODE BIOPSY Left 06/25/2021   Procedure: MASTECTOMY WITH SENTINEL LYMPH NODE BIOPSY;  Surgeon: [name], MD;  Location: [facility] ORS;  Service: General;  Laterality: Left;   NASAL RECONSTRUCTION  2005   for [place] L nare s/p Mohs   nuclear stress test  2014   normal in [place] BX Left 09/11/2020   Procedure: PARTIAL MASTECTOMY WITH NEEDLE LOCALIZATION AND AXILLARY SENTINEL LYMPH NODE [place];  Surgeon: [name], MD;  Location: [facility] ORS;  Service: General;  Laterality: Left;   RADIOFREQUENCY ABLATION Left 01/2012   remnant L G saphenous vein below knee   RADIOFREQUENCY ABLATION Right 02/2013   RLE vein ablation    VEIN LIGATION AND STRIPPING  remote   bilateral G saphenous veins    There were no vitals filed for this visit.   Subjective Assessment - 03/09/22 1422     Subjective L knee is letting her know it is there. It is not quiet. 1.5/10 currently. L Achilles bothered her in the middle of the night. Did not notice L Achilles pain when walking currently.  Pertinent History Pt is a pleasant 87 y.o. female referred to OP PT for L knee pain. PMH includes: HTN, history of breast cancer, chronic venous insufficiency, history of basal cell carcinoma, GERD. Pt  reports having bad arthritis in L knee. Pain is worsened with quick movements, too much walking, maintaining knee in a certain position for too long, turning in her sleep hurts. Pain described as sharp and deep and is denying stiffness due to being active with HEP from previous therapy she just finished earlier this week. Pain is improved with consistent exercise and rest. Pt's goal is to improve pain and mobility in L knee and balance/walking tolerance for community ADL's.    Limitations Standing;Walking    Patient Stated Goals Self management of L knee pain and improving wlaking ADL's     Currently in Pain? Yes    Pain Score 2     Pain Location Knee    Pain Orientation Left    Pain Onset More than a month ago                                        PT Education - 03/09/22 1457     Education Details ther-ex    Person(s) Educated Patient    Methods Explanation;Demonstration;Tactile cues;Verbal cues    Comprehension Returned demonstration;Verbalized understanding            Objective     No latex band allergies   B varicose veins legs Osteopenia     L heel pain Hx of L mastectomy on June 25, 2021 and had home health PT for recovery.      Started using a SPC 5 months ago when ambulating by herself. Furniture walk at home.    Medbridge Access Code [email or web address]     Therapeutic exercise   TTP L greater trochanter  Hooklying manually resisted hip abduction, leg straight   L 10x5 seconds for 2 sets  Feels like it helps her hip per pt  Seated gentle L knee joint distraction to decrease joint pressure 10x5 seconds for 2 sets  Feels good per pt.   Seated L leg medial rotation 5x3 with 5 second holds    Seated trunk rotation 10x2 each side   Decreased L lateral knee burning sensation  Seated trunk flexion stretch 10x5 seconds    Seated B scapular retraction 10x5 seconds       Improved exercise technique, movement at target joints, use of target muscles after mod verbal, visual, tactile cues.      Manual therapy   Supine gentle medial glide L leg grade 1 for pain control   Supine STM L lateral hamstrings and vastus lateralis muscles to decrease tension         Response to treatment Pt tolerated session well without aggravation of symptoms.          Clinical Impression Decreased L knee pain to 0.5/10 and no L knee or Achilles burning during gait after session. Worked on decreasing extension pressure to low back to decrease L LE burning sensation. Continued working on improving L glute strength to help promote  better L knee and foot mechanics to decrease stress to affected areas.  Pt tolerate session well without aggravation of symptoms. Pt will benefit from continued skilled physical therapy services to decrease pain, improve strength and function.    PT Short Term Goals - 01/30/22 4818  PT SHORT TERM GOAL #1   Title Pt will be independent with HEP to improve L knee AROM and global strength to improve walking tasks    Baseline 01/29/22: Initiated    Time 4    Period Weeks    Status New    Target Date 02/26/22               PT Long Term Goals - 03/02/22 1435       PT LONG TERM GOAL #1   Title Pt will improve knee FOTO to target score to display clinically significant improvement in functional mobility.    Baseline 01/29/22: Next session    Time 8    Period Weeks    Status New    Target Date 03/26/22      PT LONG TERM GOAL #2   Title Pt will improve L Knee extension by 5 deg or greater to demonstrate clinically significant improvement for improved gait mechanics.    Baseline 01/29/2022: L knee lacking 7 deg from TKE. R knee extension lacking 2 deg from TKE.    Time 8    Period Weeks    Status New    Target Date 03/26/22      PT LONG TERM GOAL #3   Title Pt will improve 2 min walk test to 39' for age matched norms to improve community ambulation tasks and reduced risk of falls.    Baseline 01/29/22: 316'; 300 ft without use of SPC. Pt states L Achilles bothered her a little bit.  (03/02/2022)    Time 8    Period Weeks    Status New    Target Date 03/26/22      PT LONG TERM GOAL #4   Title Pt will improve 5xSTS  to </= 12 sec to demonstrate clinically significant improvement in LE strength.    Baseline 01/29/22: 18.13 sec with UE use on arm rests; 14 seconds with B UE assist wth L Achilles discomfort. (03/02/2022)    Time 8    Period Weeks    Status Partially Met    Target Date 03/26/22                   Plan - 03/09/22 1458     Clinical Impression Statement  Decreased L knee pain to 0.5/10 and no L knee or Achilles burning during gait after session. Worked on decreasing extension pressure to low back to decrease L LE burning sensation. Continued working on improving L glute strength to help promote better L knee and foot mechanics to decrease stress to affected areas.   Pt tolerate session well without aggravation of symptoms. Pt will benefit from continued skilled physical therapy services to decrease pain, improve strength and function.    Personal Factors and Comorbidities Age;Comorbidity 3+;Time since onset of injury/illness/exacerbation;Fitness;Past/Current Experience    Comorbidities Osteopenia, chronic venous insufficiency,Hx of basal cell CA S/P lumpectomy, HTN    Examination-Activity Limitations Stand;Locomotion Level;Squat;Stairs;Carry;Lift;Bed Mobility;Transfers    Examination-Participation Restrictions Community Activity    Stability/Clinical Decision Making Stable/Uncomplicated    Clinical Decision Making Low    Rehab Potential Fair    Clinical Impairments Affecting Rehab Potential Chronicity of condition, age, fitness level    PT Frequency 2x / week    PT Duration 8 weeks    PT Treatment/Interventions Gait training;Functional mobility training;Therapeutic activities;Therapeutic exercise;Neuromuscular re-education;Patient/family education;Manual techniques;ADLs/Self Care Home Management;Aquatic Therapy;Cryotherapy;Electrical Stimulation;Moist Heat;Traction;Passive range of motion    PT Next Visit Plan Reassess HEP, address knee extension AROM  limitations    PT Home Exercise Plan Access Code [place] and Agree with Plan of Care Patient             Patient will benefit from skilled therapeutic intervention in order to improve the following deficits and impairments:  Postural dysfunction, Decreased strength, Improper body mechanics, Pain, Difficulty walking, Abnormal gait, Hypomobility, Decreased activity tolerance, Decreased  mobility, Decreased range of motion  Visit Diagnosis: Chronic pain of left knee  Difficulty in walking, not elsewhere classified  Muscle weakness (generalized)     Problem List Patient Active Problem List   Diagnosis Date Noted   Venous ulcer of ankle, unspecified laterality ([place]) 12/24/2021   Bilateral lower extremity edema 12/17/2021   Pre-op evaluation 06/02/2021   Right wrist pain 02/28/2021   Skin rash 02/28/2021   Ascending aorta dilatation ([place]) 01/10/2021   Left-sided chest pain 01/10/2021   Genetic testing 10/30/2020   Malignant neoplasm of overlapping sites of left breast in female, estrogen receptor positive ([place]) 10/22/2020   Family history of breast cancer    Family history of thyroid cancer    Insertional Achilles tendinopathy 08/28/2020   Breast cancer, left breast ([place]) 08/01/2020   Right carotid bruit 06/05/2020   Chest pressure 06/03/2020   Left Achilles tendinitis 09/01/2019   Low back pain 08/29/2019   Lumbosacral spondylosis without myelopathy 08/29/2019   Calcaneal spur 08/17/2019   Contusion of knee 10/04/2018   Varicose veins of bilateral lower extremities with pain 03/30/2018   Lymphedema 03/30/2018   Dizziness 02/02/2017   Situational depression 12/18/2016   Vitamin D deficiency 10/11/2016   Fatigue 08/12/2016   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 04/14/2016   Essential hypertension 12/17/2015   Dermatitis of external ear 12/17/2015   Corn of foot 05/16/2015   Advanced care planning/counseling discussion 10/30/2014   Neck pain on right side 06/25/2014   Primary osteoarthritis of left knee 08/17/2013   Oropharyngeal dysphagia 11/15/2012   Stressful life events affecting family and household 03/30/2012   Medicare annual wellness visit, subsequent 03/30/2012   History of basal cell carcinoma    Osteoarthritis    Osteopenia    Hypothyroidism    Glaucoma    Chronic venous insufficiency    Joneen Boers PT,  DPT  03/09/2022, 5:01 PM  [facility] [address], [place], [address] Phone: [phone]   Fax:  [phone]  Name: MARYLON VERNO MRN: [id number] Date of Birth: 1935-04-15

## 2022-03-11 ENCOUNTER — Ambulatory Visit: Payer: Medicare Other

## 2022-03-11 ENCOUNTER — Other Ambulatory Visit: Payer: Self-pay

## 2022-03-11 DIAGNOSIS — G8929 Other chronic pain: Secondary | ICD-10-CM | POA: Diagnosis not present

## 2022-03-11 DIAGNOSIS — R262 Difficulty in walking, not elsewhere classified: Secondary | ICD-10-CM | POA: Diagnosis not present

## 2022-03-11 DIAGNOSIS — M6281 Muscle weakness (generalized): Secondary | ICD-10-CM

## 2022-03-11 DIAGNOSIS — M25562 Pain in left knee: Secondary | ICD-10-CM | POA: Diagnosis not present

## 2022-03-11 NOTE — Therapy (Signed)
[facility] ?[address] ?[place], [place], [address] ?Phone: [phone]   Fax:  [phone] ? ?Physical Therapy Treatment ? ?Patient Details  ?Name: Beth Rodgers ?MRN: [id number] ?Date of Birth: March 23, 1935 ?Referring Provider (PT): Tamala Julian, PA ? ? ?Encounter Date: 03/11/2022 ? ? PT End of Session - 03/11/22 1554   ? ? Visit Number 15   ? Number of Visits 17   ? Date for PT Re-Evaluation 03/27/22   ? PT Start Time 9476   ? PT Stop Time 1630   ? PT Time Calculation (min) 41 min   ? Activity Tolerance Patient tolerated treatment well   ? Behavior During Therapy [facility] for tasks assessed/performed   ? ?  ?  ? ?  ? ? ?Past Medical History:  ?Diagnosis Date  ? Basal cell carcinoma (BCC)   ? txted with MOHs in past  ? Cellulitis 08/10/2018  ? Chronic venous insufficiency   ? with varicose veins  ? Family history of breast cancer   ? Family history of thyroid cancer   ? GERD (gastroesophageal reflux disease)   ? Glaucoma   ? History  of basal cell carcinoma   ? left nare/BREAST CANCER  ? History of chicken pox   ? Hypertension   ? Hypoglycemia   ? Hypothyroid   ? Osteoarthritis   ? back pain, L knee pain  ? Osteopenia 06/2009  ? DEXA 11/2015: T -2.3 hip, -2.2 spine, on longterm bisphosphonate/evista  ? Psoriasis   ? Pyogenic granuloma 04/16/2016  ? ? ?Past Surgical History:  ?Procedure Laterality Date  ? BASAL CELL CARCINOMA EXCISION    ? located on face  ? BREAST BIOPSY Right   ? core done ing [place]?  ? BREAST BIOPSY Left 08/16/2020  ? 3 area bx 4:00 X, IMC, 6:00Q IMC, LN hydro #3-benign  ? BREAST LUMPECTOMY Left 09/11/2020  ? two areas of [facility]  ? CATARACT EXTRACTION    ? bilateral  ? DEXA  06/2009  ? T score Spine: -1.8, hip -2.0  ? EXCISION OF BREAST BIOPSY Left 09/11/2020  ? Procedure: EXCISION OF BREAST BIOPSY;  Surgeon: [name], MD;  Location: [facility] ORS;  Service: General;  Laterality: Left;  ? Foam sclerotherapy Bilateral 09/2015  ?  [place], [place]  ? MASTECTOMY W/ SENTINEL NODE BIOPSY Left 06/25/2021  ? Procedure: MASTECTOMY WITH SENTINEL LYMPH NODE BIOPSY;  Surgeon: [name], MD;  Location: [facility] OR[facility];  Service: General;  Laterality: Left;  ? NASAL RECONSTRUCTION  2005  ? for [place] L nare s/p Mohs  ? nuclear stress test  2014  ? normal in [place]  ? PARTIAL MASTECTOMY WITH NEEDLE LOCALIZATION AND AXILLARY SENTINEL LYMPH NODE BX Left 09/11/2020  ? Procedure: PARTIAL MASTECTOMY WITH NEEDLE LOCALIZATION AND AXILLARY SENTINEL LYMPH NODE BX;  Surgeon: [name], MD;  Location: [facility] ORS;  Service: General;  Laterality: Left;  ? RADIOFREQUENCY ABLATION Left 01/2012  ? remnant L G saphenous vein below knee  ? RADIOFREQUENCY ABLATION Right 02/2013  ? RLE vein ablation   ? VEIN LIGATION AND STRIPPING  remote  ? bilateral G saphenous veins  ? ? ?There were no vitals filed for this visit. ? ? Subjective Assessment - 03/11/22 1551   ? ? Subjective Pt reports her L knee is "up and down". reports today it is sensitive with 1-1.5/10 with palpation and walking.   ? Pertinent History Pt is a pleasant 87 y.o. female referred to  OP PT for L knee pain. PMH includes: HTN, history of breast cancer, chronic venous insufficiency, history of basal cell carcinoma, GERD. Pt  reports having bad arthritis in L knee. Pain is worsened with quick movements, too much walking, maintaining knee in a certain position for too long, turning in her sleep hurts. Pain described as sharp and deep and is denying stiffness due to being active with HEP from previous therapy she just finished earlier this week. Pain is improved with consistent exercise and rest. Pt's goal is to improve pain and mobility in L knee and balance/walking tolerance for community ADL's.   ? Limitations Standing;Walking   ? Patient Stated Goals Self management of L knee pain and improving wlaking ADL's   ? Currently in Pain? Yes   ? Pain Score 1    ? Pain Location Knee   ? Pain  Orientation Left   ? Pain Onset More than a month ago   ? Pain Frequency Intermittent   ? ?  ?  ? ?  ? ?There.ex:  ? Seated L knee AROM roll outs on  blue med ball: 2x20 reps to improve flexion/extension  ? ? Seated L hamstring stretch: 3x1 minute holds  ? ? STS 2x8 with very light BUE support on chair rails for support with concentric and eccentric portions.  ? ? Supine/hook lying exercises:  ?  LLE SLR: 2x8 no resistance.  ?  Resisted clam shells: GTB, 2x8, 3 sec holds ?  Hamstring curls on red physioball: 2x20 ? ? Standing exercises:  ?  Heel raises with BUE support: 2x20  ?  Hip abduction: 2# AW's, 2x10/LE, BUE support. X10/LE without resistance with UE support. Requiring frequent, multimodal cues for form/technique due to L sided weakness.  ? ? ? PT Education - 03/11/22 1553   ? ? Education Details form/technique with exercise.   ? Person(s) Educated Patient   ? Methods Explanation;Demonstration;Tactile cues;Verbal cues   ? Comprehension Verbalized understanding;Returned demonstration   ? ?  ?  ? ?  ? ? ? PT Short Term Goals - 01/30/22 0858   ? ?  ? PT SHORT TERM GOAL #1  ? Title Pt will be independent with HEP to improve L knee AROM and global strength to improve walking tasks   ? Baseline 01/29/22: Initiated   ? Time 4   ? Period Weeks   ? Status New   ? Target Date 02/26/22   ? ?  ?  ? ?  ? ? ? ? PT Long Term Goals - 03/02/22 1435   ? ?  ? PT LONG TERM GOAL #1  ? Title Pt will improve knee FOTO to target score to display clinically significant improvement in functional mobility.   ? Baseline 01/29/22: Next session   ? Time 8   ? Period Weeks   ? Status New   ? Target Date 03/26/22   ?  ? PT LONG TERM GOAL #2  ? Title Pt will improve L Knee extension by 5 deg or greater to demonstrate clinically significant improvement for improved gait mechanics.   ? Baseline 01/29/2022: L knee lacking 7 deg from TKE. R knee extension lacking 2 deg from TKE.   ? Time 8   ? Period Weeks   ? Status New   ? Target Date 03/26/22   ?   ? PT LONG TERM GOAL #3  ? Title Pt will improve 2 min walk test to 42' for age matched norms to improve  community ambulation tasks and reduced risk of falls.   ? Baseline 01/29/22: 316'; 300 ft without use of SPC. Pt states L Achilles bothered her a little bit.  (03/02/2022)   ? Time 8   ? Period Weeks   ? Status New   ? Target Date 03/26/22   ?  ? PT LONG TERM GOAL #4  ? Title Pt will improve 5xSTS  to </= 12 sec to demonstrate clinically significant improvement in LE strength.   ? Baseline 01/29/22: 18.13 sec with UE use on arm rests; 14 seconds with B UE assist wth L Achilles discomfort. (03/02/2022)   ? Time 8   ? Period Weeks   ? Status Partially Met   ? Target Date 03/26/22   ? ?  ?  ? ?  ? ? ? ? ? ? ? ? Plan - 03/11/22 1619   ? ? Clinical Impression Statement Following PT POC improving L knee AROM and LE strength with focus on hip musculature for L knee support with walking activities. Pt displaying significant weakness in hip abductors on L side with OKC and CKC requiring compensations at trunk and UE's for support. Post session pt reports decreased L knee pain. Pt will continues to benefit from skilled Pt services to decrease L knee pain and improve strength for overall improvement in functional mobility.   ? Personal Factors and Comorbidities Age;Comorbidity 3+;Time since onset of injury/illness/exacerbation;Fitness;Past/Current Experience   ? Comorbidities Osteopenia, chronic venous insufficiency,Hx of basal cell CA S/P lumpectomy, HTN   ? Examination-Activity Limitations Stand;Locomotion Level;Squat;Stairs;Carry;Lift;Bed Mobility;Transfers   ? Examination-Participation Restrictions Community Activity   ? Stability/Clinical Decision Making Stable/Uncomplicated   ? Rehab Potential Fair   ? Clinical Impairments Affecting Rehab Potential Chronicity of condition, age, fitness level   ? PT Frequency 2x / week   ? PT Duration 8 weeks   ? PT Treatment/Interventions Gait training;Functional mobility  training;Therapeutic activities;Therapeutic exercise;Neuromuscular re-education;Patient/family education;Manual techniques;ADLs/Self Care Home Management;Aquatic Therapy;Cryotherapy;Electrical Stimulation;Moist Heat;Traction;P

## 2022-03-12 DIAGNOSIS — H903 Sensorineural hearing loss, bilateral: Secondary | ICD-10-CM | POA: Diagnosis not present

## 2022-03-12 DIAGNOSIS — J300 Vasomotor rhinitis: Secondary | ICD-10-CM | POA: Diagnosis not present

## 2022-03-12 DIAGNOSIS — H6121 Impacted cerumen, right ear: Secondary | ICD-10-CM | POA: Diagnosis not present

## 2022-03-16 ENCOUNTER — Other Ambulatory Visit: Payer: Self-pay

## 2022-03-16 ENCOUNTER — Ambulatory Visit: Payer: Medicare Other

## 2022-03-16 DIAGNOSIS — M6281 Muscle weakness (generalized): Secondary | ICD-10-CM | POA: Diagnosis not present

## 2022-03-16 DIAGNOSIS — M25562 Pain in left knee: Secondary | ICD-10-CM | POA: Diagnosis not present

## 2022-03-16 DIAGNOSIS — R262 Difficulty in walking, not elsewhere classified: Secondary | ICD-10-CM

## 2022-03-16 DIAGNOSIS — G8929 Other chronic pain: Secondary | ICD-10-CM | POA: Diagnosis not present

## 2022-03-16 NOTE — Therapy (Signed)
[place] [facility] ?[address] ?[place], [place], [address] ?Phone: [phone]   Fax:  [phone] ? ?Physical Therapy Treatment ? ?Patient Details  ?Name: Beth Rodgers ?MRN: [id number] ?Date of Birth: 1935/04/20 ?Referring Provider (PT): Tamala Julian, PA ? ? ?Encounter Date: 03/16/2022 ? ? PT End of Session - 03/16/22 1336   ? ? Visit Number 16   ? Number of Visits 17   ? Date for PT Re-Evaluation 03/27/22   ? PT Start Time 1336   ? PT Stop Time 1416   ? PT Time Calculation (min) 40 min   ? Activity Tolerance Patient tolerated treatment well   ? Behavior During Therapy [facility] for tasks assessed/performed   ? ?  ?  ? ?  ? ? ?Past Medical History:  ?Diagnosis Date  ? Basal cell carcinoma (BCC)   ? txted with MOHs in past  ? Cellulitis 08/10/2018  ? Chronic venous insufficiency   ? with varicose veins  ? Family history of breast cancer   ? Family history of thyroid cancer   ? GERD (gastroesophageal reflux disease)   ? Glaucoma   ? History  of basal cell carcinoma   ? left nare/BREAST CANCER  ? History of chicken pox   ? Hypertension   ? Hypoglycemia   ? Hypothyroid   ? Osteoarthritis   ? back pain, L knee pain  ? Osteopenia 06/2009  ? DEXA 11/2015: T -2.3 hip, -2.2 spine, on longterm bisphosphonate/evista  ? Psoriasis   ? Pyogenic granuloma 04/16/2016  ? ? ?Past Surgical History:  ?Procedure Laterality Date  ? BASAL CELL CARCINOMA EXCISION    ? located on face  ? BREAST BIOPSY Right   ? core done ing [place]?  ? BREAST BIOPSY Left 08/16/2020  ? 3 area bx 4:00 X, IMC, 6:00Q IMC, LN hydro #3-benign  ? BREAST LUMPECTOMY Left 09/11/2020  ? two areas of [facility]  ? CATARACT EXTRACTION    ? bilateral  ? DEXA  06/2009  ? T score Spine: -1.8, hip -2.0  ? EXCISION OF BREAST BIOPSY Left 09/11/2020  ? Procedure: EXCISION OF BREAST BIOPSY;  Surgeon: [name], MD;  Location: [facility] OR[facility];  Service: General;  Laterality: Left;  ? Foam sclerotherapy Bilateral 09/2015  ?  [place], [place]  ? MASTECTOMY W/ SENTINEL NODE BIOPSY Left 06/25/2021  ? Procedure: MASTECTOMY WITH SENTINEL LYMPH NODE BIOPSY;  Surgeon: [name], MD;  Location: [facility] OR[facility];  Service: General;  Laterality: Left;  ? NASAL RECONSTRUCTION  2005  ? for [place] L nare s/p Mohs  ? nuclear stress test  2014  ? normal in [place]  ? PARTIAL MASTECTOMY WITH NEEDLE LOCALIZATION AND AXILLARY SENTINEL LYMPH NODE BX Left 09/11/2020  ? Procedure: PARTIAL MASTECTOMY WITH NEEDLE LOCALIZATION AND AXILLARY SENTINEL LYMPH NODE BX;  Surgeon: [name], MD;  Location: [facility];  Service: General;  Laterality: Left;  ? RADIOFREQUENCY ABLATION Left 01/2012  ? remnant L G saphenous vein below knee  ? RADIOFREQUENCY ABLATION Right 02/2013  ? RLE vein ablation   ? VEIN LIGATION AND STRIPPING  remote  ? bilateral G saphenous veins  ? ? ?There were no vitals filed for this visit. ? ? Subjective Assessment - 03/16/22 1337   ? ? Subjective Worked hard last session... took a good rest when she got home. A little bit of pain currently. Had to put some ointments for pain in L knee and L achilles last night. Used  the night splint for her heel. Discomfort L Achilles more than pain. Did not notice L knee pain currently. L lateral knee joint pain when she moves her leg from friction.   ? Pertinent History Pt is a pleasant 87 y.o. female referred to OP PT for L knee pain. PMH includes: HTN, history of breast cancer, chronic venous insufficiency, history of basal cell carcinoma, GERD. Pt  reports having bad arthritis in L knee. Pain is worsened with quick movements, too much walking, maintaining knee in a certain position for too long, turning in her sleep hurts. Pain described as sharp and deep and is denying stiffness due to being active with HEP from previous therapy she just finished earlier this week. Pain is improved with consistent exercise and rest. Pt's goal is to improve pain and mobility in L knee and  balance/walking tolerance for community ADL's.   ? Limitations Standing;Walking   ? Patient Stated Goals Self management of L knee pain and improving wlaking ADL's   ? Currently in Pain? Yes   No pain level provided.  ? Pain Onset More than a month ago   ? ?  ?  ? ?  ? ? ? ? ? ? ? ? ? ? ? ? ? ? ? ? ? ? ? ? ? ? ? ? ? ? ? ? ? PT Education - 03/16/22 1353   ? ? Education Details ther-ex   ? Methods Explanation;Demonstration;Tactile cues;Verbal cues   ? Comprehension Returned demonstration;Verbalized understanding   ? ?  ?  ? ?  ? ? ?Objective   ?  ?No latex band allergies ?  ?B varicose veins legs ?Osteopenia ?  ?  ?L heel pain ?Hx of L mastectomy on June 25, 2021 and had home health PT for recovery.  ?  ?  ? ?  ?[place] ?  ?There.ex:  ?   ?Seated gentle manual L knee joint distraction to improve joint space prior to exercises ? ?Standing hip abduction with B UE assist  ? L 10x5 seconds, then 2x5 seconds, then 10x2 ? ?Seated hip ER L 10x. Possible L hip discomfort ? ?Seated hip adduction isometrics small physioball squeeze 10x10 seconds for 2 sets ? ?Standing with B UE assist  ? L hip extension 10x2 ?Low back discomfort, eases with sitting rest break ? ?Seated L ankle PF red band 10x3 ?  ?STS 8x with very light BUE support on chair rails for support with concentric and eccentric portions.  ? ? ? ?Improved exercise technique, movement at target joints, use of target muscles after mod verbal, visual, tactile cues.  ?  ?  ?  ?  ?  ?Response to treatment ?Pt tolerated session well without aggravation of symptoms.  ?  ?  ?  ?  ?Clinical Impression ?Continued working on improving L glute strength to help promote better L knee and foot mechanics to decrease stress to affected areas. Continued working on concentric L gastroc muscle activation to promote proper stress and healing to L Achilles tendon. Pt tolerate session well without aggravation of symptoms. Pt will benefit from continued skilled physical  therapy services to decrease pain, improve strength and function. ? ? ? ? ?  ? ? ? ? ? ? ? PT Short Term Goals - 01/30/22 0858   ? ?  ? PT SHORT TERM GOAL #1  ? Title Pt will be independent with HEP to improve L knee AROM and global strength to improve walking tasks   ?  Baseline 01/29/22: Initiated   ? Time 4   ? Period Weeks   ? Status New   ? Target Date 02/26/22   ? ?  ?  ? ?  ? ? ? ? PT Long Term Goals - 03/02/22 1435   ? ?  ? PT LONG TERM GOAL #1  ? Title Pt will improve knee FOTO to target score to display clinically significant improvement in functional mobility.   ? Baseline 01/29/22: Next session   ? Time 8   ? Period Weeks   ? Status New   ? Target Date 03/26/22   ?  ? PT LONG TERM GOAL #2  ? Title Pt will improve L Knee extension by 5 deg or greater to demonstrate clinically significant improvement for improved gait mechanics.   ? Baseline 01/29/2022: L knee lacking 7 deg from TKE. R knee extension lacking 2 deg from TKE.   ? Time 8   ? Period Weeks   ? Status New   ? Target Date 03/26/22   ?  ? PT LONG TERM GOAL #3  ? Title Pt will improve 2 min walk test to 35' for age matched norms to improve community ambulation tasks and reduced risk of falls.   ? Baseline 01/29/22: 316'; 300 ft without use of SPC. Pt states L Achilles bothered her a little bit.  (03/02/2022)   ? Time 8   ? Period Weeks   ? Status New   ? Target Date 03/26/22   ?  ? PT LONG TERM GOAL #4  ? Title Pt will improve 5xSTS  to </= 12 sec to demonstrate clinically significant improvement in LE strength.   ? Baseline 01/29/22: 18.13 sec with UE use on arm rests; 14 seconds with B UE assist wth L Achilles discomfort. (03/02/2022)   ? Time 8   ? Period Weeks   ? Status Partially Met   ? Target Date 03/26/22   ? ?  ?  ? ?  ? ? ? ? ? ? ? ? Plan - 03/16/22 1335   ? ? Clinical Impression Statement Continued working on improving L glute strength to help promote better L knee and foot mechanics to decrease stress to affected areas. Continued working on  concentric L gastroc muscle activation to promote proper stress and healing to L Achilles tendon. Pt tolerate session well without aggravation of symptoms. Pt will benefit from continued skilled physical therapy services to

## 2022-03-16 NOTE — Patient Instructions (Signed)
Access Code: [email or web address] ?URL: [email or web address] ?Date: 03/16/2022 ?Prepared by: Joneen Boers ? ?Exercises ?Seated Hip External Rotation AROM - 1 x daily - 7 x weekly - 2 sets - 10 reps ?Standing Hip Abduction with Counter Support - 1 x daily - 7 x weekly - 2-3 sets - 10 reps ?Seated Eccentric Ankle Plantar Flexion with Resistance - Straight Leg - 1 x daily - 7 x weekly - 3 sets - 10 reps ?Seated Hip Adduction Isometrics with Ball - 1 x daily - 7 x weekly - 2 sets - 10 reps - 5 seconds hold ?Seated Cervical Retraction - 1 x daily - 7 x weekly - 3 sets - 10 reps - 5 seconds hold ?Seated Scapular Retraction - 1 x daily - 7 x weekly - 3 sets - 10 reps - 5 seconds hold ?Hooklying Clamshell with Resistance - 1 x daily - 7 x weekly - 3 sets - 10 reps - 5 seconds hold ?Seated Long Arc Quad with Ankle Weight - 1 x daily - 7 x weekly - 3 sets - 5 reps ? ?

## 2022-03-17 ENCOUNTER — Ambulatory Visit (INDEPENDENT_AMBULATORY_CARE_PROVIDER_SITE_OTHER): Payer: Medicare Other | Admitting: Psychology

## 2022-03-17 DIAGNOSIS — F4323 Adjustment disorder with mixed anxiety and depressed mood: Secondary | ICD-10-CM | POA: Diagnosis not present

## 2022-03-17 NOTE — Progress Notes (Signed)
[facility] Counselor/Therapist Progress Note ? ?Patient ID: Beth Rodgers, MRN: [id number]   ? ?Date: 03/17/22 ? ?Time Spent: 1:02 pm - 1:50 pm : 48 Minutes ? ?Treatment Type: Individual Therapy. ? ?Reported Symptoms: Rumination, anxiety.   ? ?Mental Status Exam: ?Appearance:  NA     ?Behavior: Appropriate  ?Motor: NA  ?Speech/Language:  Clear and Coherent and Normal Rate  ?Affect: Congruent  ?Mood: dysthymic  ?Thought process: normal  ?Thought content:   WNL  ?Sensory/Perceptual disturbances:   WNL  ?Orientation: oriented to person, place, time/date, and situation  ?Attention: Good  ?Concentration: Good  ?Memory: WNL  ?Fund of knowledge:  Good  ?Insight:   Good  ?Judgment:  Good  ?Impulse Control: Good  ? ?Risk Assessment: ?Danger to Self:  No ?Self-injurious Behavior: No ?Danger to Others: No ?Duty to Warn:no ?Physical Aggression / Violence:No  ?Access to Firearms a concern: No  ?Gang Involvement:No  ? ?Subjective:  ? ?Beth Rodgers participated from home, via phone, and consented to treatment. Therapist participated from office. We met online due to [place] pandemic. Beth Rodgers reviewed the events of the past two weeks. Beth Rodgers noted working on acceptance regarding her son's mental health and overall poor functioning. She noted her attempts to "ignore" some of his behavior due to feeling of a lack of control. She noted frustration regarding the lack of prioritization regarding her simple requests of her other son, Beth Rodgers. She discussed her attempts to communicate needs and noted frustration regarding her son's lack of effort and continued empty promises. She discussed her worry regarding end of life planning and her husband's avoidance regarding this issue. We explored ways to communicate her needs empathetically and assertively.  Therapist modeled this during the session.  Beth Rodgers was engaged and motivated during the session and expressed her commitment towards addressing the goals set within the session.   Therapist praised Beth Rodgers, Beth (physical sciences) for her effort and energy and encouraged the use of the tools reviewed during the session.  Therapist praised Beth Rodgers, Beth (physical sciences) for identifying areas of control and working hopefully within those.  Therapist provided supportive therapy. ?  ?Interventions: Interpersonal and ACT ? ?Diagnosis: Adjustment disorder with mixed anxiety and depressed mood ? ?Plan: Patient is to use CBT, BA, Problem-solving, mindfulness and coping skills to help manage decrease symptoms associated with their diagnosis. (Target date: 08/16/22) ?  ?Long-term goal:   ?Reduce overall level, frequency, and intensity of the feelings of depression and anxiety evidenced by   decreased sadness, rumination, lethargy, and interpersonal stressors  from 6 to 7 days/week to 0 to 1 days/week per client report for at least 3 consecutive months. ? ?Short-term goal:  ?Verbally express understanding of the relationship between feelings of depression, anxiety and their impact on thinking patterns and behaviors. ? ?Verbalize an understanding of the role that distorted thinking plays in creating fears, excessive worry, and ruminations. ? ?Engage in enjoyable activities on a consistent basis. ? ?[name], LCSW ?

## 2022-03-18 ENCOUNTER — Ambulatory Visit: Payer: Medicare Other

## 2022-03-18 ENCOUNTER — Other Ambulatory Visit: Payer: Self-pay

## 2022-03-18 DIAGNOSIS — M6281 Muscle weakness (generalized): Secondary | ICD-10-CM

## 2022-03-18 DIAGNOSIS — G8929 Other chronic pain: Secondary | ICD-10-CM

## 2022-03-18 DIAGNOSIS — R262 Difficulty in walking, not elsewhere classified: Secondary | ICD-10-CM

## 2022-03-18 DIAGNOSIS — M25562 Pain in left knee: Secondary | ICD-10-CM | POA: Diagnosis not present

## 2022-03-18 NOTE — Therapy (Signed)
[facility] ?[address] ?[place], [place], [address] ?Phone: [phone]   Fax:  [phone] ? ?Physical Therapy Treatment ? ?Patient Details  ?Name: Beth Rodgers ?MRN: [id number] ?Date of Birth: 07/18/1935 ?Referring Provider (PT): Tamala Julian, PA ? ? ?Encounter Date: 03/18/2022 ? ? PT End of Session - 03/18/22 1635   ? ? Visit Number 17   ? Number of Visits 20   ? Date for PT Re-Evaluation 03/27/22   ? PT Start Time 4562   ? PT Stop Time 1630   ? PT Time Calculation (min) 43 min   ? Activity Tolerance Patient tolerated treatment well   ? Behavior During Therapy [facility] for tasks assessed/performed   ? ?  ?  ? ?  ? ? ?Past Medical History:  ?Diagnosis Date  ? Basal cell carcinoma (BCC)   ? txted with MOHs in past  ? Cellulitis 08/10/2018  ? Chronic venous insufficiency   ? with varicose veins  ? Family history of breast cancer   ? Family history of thyroid cancer   ? GERD (gastroesophageal reflux disease)   ? Glaucoma   ? History  of basal cell carcinoma   ? left nare/BREAST CANCER  ? History of chicken pox   ? Hypertension   ? Hypoglycemia   ? Hypothyroid   ? Osteoarthritis   ? back pain, L knee pain  ? Osteopenia 06/2009  ? DEXA 11/2015: T -2.3 hip, -2.2 spine, on longterm bisphosphonate/evista  ? Psoriasis   ? Pyogenic granuloma 04/16/2016  ? ? ?Past Surgical History:  ?Procedure Laterality Date  ? BASAL CELL CARCINOMA EXCISION    ? located on face  ? BREAST BIOPSY Right   ? core done ing [place]?  ? BREAST BIOPSY Left 08/16/2020  ? 3 area bx 4:00 X, IMC, 6:00Q IMC, LN hydro #3-benign  ? BREAST LUMPECTOMY Left 09/11/2020  ? two areas of [facility]  ? CATARACT EXTRACTION    ? bilateral  ? DEXA  06/2009  ? T score Spine: -1.8, hip -2.0  ? EXCISION OF BREAST BIOPSY Left 09/11/2020  ? Procedure: EXCISION OF BREAST BIOPSY;  Surgeon: [name], MD;  Location: [facility] ORS;  Service: General;  Laterality: Left;  ? Foam sclerotherapy Bilateral 09/2015  ?  [place], [place]  ? MASTECTOMY W/ SENTINEL NODE BIOPSY Left 06/25/2021  ? Procedure: MASTECTOMY WITH SENTINEL LYMPH NODE BIOPSY;  Surgeon: [name], MD;  Location: [facility] ORS;  Service: General;  Laterality: Left;  ? NASAL RECONSTRUCTION  2005  ? for [place] L nare s/p Mohs  ? nuclear stress test  2014  ? normal in [place]  ? PARTIAL MASTECTOMY WITH NEEDLE LOCALIZATION AND AXILLARY SENTINEL LYMPH NODE BX Left 09/11/2020  ? Procedure: PARTIAL MASTECTOMY WITH NEEDLE LOCALIZATION AND AXILLARY SENTINEL LYMPH NODE BX;  Surgeon: [name], MD;  Location: [facility] ORS;  Service: General;  Laterality: Left;  ? RADIOFREQUENCY ABLATION Left 01/2012  ? remnant L G saphenous vein below knee  ? RADIOFREQUENCY ABLATION Right 02/2013  ? RLE vein ablation   ? VEIN LIGATION AND STRIPPING  remote  ? bilateral G saphenous veins  ? ? ?There were no vitals filed for this visit. ? ? Subjective Assessment - 03/18/22 1634   ? ? Subjective Pt reports pain at a 1.5/10 in L knee today.   ? Pertinent History Pt is a pleasant 87 y.o. female referred to OP PT for L knee pain. PMH includes: HTN, history  of breast cancer, chronic venous insufficiency, history of basal cell carcinoma, GERD. Pt  reports having bad arthritis in L knee. Pain is worsened with quick movements, too much walking, maintaining knee in a certain position for too long, turning in her sleep hurts. Pain described as sharp and deep and is denying stiffness due to being active with HEP from previous therapy she just finished earlier this week. Pain is improved with consistent exercise and rest. Pt's goal is to improve pain and mobility in L knee and balance/walking tolerance for community ADL's.   ? Limitations Standing;Walking   ? Patient Stated Goals Self management of L knee pain and improving wlaking ADL's   ? Currently in Pain? Yes   ? Pain Score --   1.5  ? Pain Location Knee   ? Pain Orientation Left   ? Pain Onset More than a month ago   ? ?  ?  ? ?   ? ?There.ex: ?  Hook lying add squeeze with green ball: 2x10, x5 sec holds  ?  Hook lying clam shells with GTB: 2x12, 5 sec holds  ?  Mini squats with BUE support with chair behind pt as visual cue for improving hip hinge: 2x8 ?  Lateral steps with 2# AW's: x6, 12' per bout for glut med strength with SBA for balance impairments  ?   ?  ? ?Manual Therapy: 10 min total ?Supine gentle manual L knee joint distraction to improve joint space prior to exercises: 3x30 sec bouts  ?Supine sup/inf/med/lat patellar grade 2 mobs for pain modulation: x5, 5 sec bouts ?Supine tibiofemoral AP grade 3 mobs , x10 for 5 sec bouts for pain modulation for improved knee extension AROM ?  ? ? PT Education - 03/18/22 1635   ? ? Education Details form/technique with exercise   ? Person(s) Educated Patient   ? Methods Explanation;Demonstration   ? Comprehension Verbalized understanding;Returned demonstration   ? ?  ?  ? ?  ? ? ? PT Short Term Goals - 01/30/22 0858   ? ?  ? PT SHORT TERM GOAL #1  ? Title Pt will be independent with HEP to improve L knee AROM and global strength to improve walking tasks   ? Baseline 01/29/22: Initiated   ? Time 4   ? Period Weeks   ? Status New   ? Target Date 02/26/22   ? ?  ?  ? ?  ? ? ? ? PT Long Term Goals - 03/02/22 1435   ? ?  ? PT LONG TERM GOAL #1  ? Title Pt will improve knee FOTO to target score to display clinically significant improvement in functional mobility.   ? Baseline 01/29/22: Next session   ? Time 8   ? Period Weeks   ? Status New   ? Target Date 03/26/22   ?  ? PT LONG TERM GOAL #2  ? Title Pt will improve L Knee extension by 5 deg or greater to demonstrate clinically significant improvement for improved gait mechanics.   ? Baseline 01/29/2022: L knee lacking 7 deg from TKE. R knee extension lacking 2 deg from TKE.   ? Time 8   ? Period Weeks   ? Status New   ? Target Date 03/26/22   ?  ? PT LONG TERM GOAL #3  ? Title Pt will improve 2 min walk test to 2' for age matched norms to improve  community ambulation tasks and reduced risk of falls.   ?  Baseline 01/29/22: 316'; 300 ft without use of SPC. Pt states L Achilles bothered her a little bit.  (03/02/2022)   ? Time 8   ? Period Weeks   ? Status New   ? Target Date 03/26/22   ?  ? PT LONG TERM GOAL #4  ? Title Pt will improve 5xSTS  to </= 12 sec to demonstrate clinically significant improvement in LE strength.   ? Baseline 01/29/22: 18.13 sec with UE use on arm rests; 14 seconds with B UE assist wth L Achilles discomfort. (03/02/2022)   ? Time 8   ? Period Weeks   ? Status Partially Met   ? Target Date 03/26/22   ? ?  ?  ? ?  ? ? ? ? ? ? ? ? Plan - 03/18/22 1653   ? ? Clinical Impression Statement Continuing PT POC with focus on LE strength and L knee AROM to reduce joint forces with [place] positions such as gait and, transfer, curbs, and stairs. Pt reports improvement in pain post session from 1.5/10 NPS to no knee pain. Pt will continue to benefit from skilled PT services to progress strength and knee mobility to improve pain with ADL's and walking tasks.   ? Personal Factors and Comorbidities Age;Comorbidity 3+;Time since onset of injury/illness/exacerbation;Fitness;Past/Current Experience   ? Comorbidities Osteopenia, chronic venous insufficiency,Hx of basal cell CA S/P lumpectomy, HTN   ? Examination-Activity Limitations Stand;Locomotion Level;Squat;Stairs;Carry;Lift;Bed Mobility;Transfers   ? Examination-Participation Restrictions Community Activity   ? Stability/Clinical Decision Making Stable/Uncomplicated   ? Rehab Potential Fair   ? Clinical Impairments Affecting Rehab Potential Chronicity of condition, age, fitness level   ? PT Frequency 2x / week   ? PT Duration 8 weeks   ? PT Treatment/Interventions Gait training;Functional mobility training;Therapeutic activities;Therapeutic exercise;Neuromuscular re-education;Patient/family education;Manual techniques;ADLs/Self Care Home Management;Aquatic Therapy;Cryotherapy;Electrical Stimulation;Moist  Heat;Traction;Passive range of motion   ? PT Next Visit Plan Reassess HEP, address knee extension AROM limitations   ? PT Home Exercise Plan Access Code [facility]   ? Consulted and Agree with Plan of Care [place]

## 2022-03-23 ENCOUNTER — Ambulatory Visit: Payer: Medicare Other

## 2022-03-23 ENCOUNTER — Other Ambulatory Visit: Payer: Self-pay

## 2022-03-23 DIAGNOSIS — R262 Difficulty in walking, not elsewhere classified: Secondary | ICD-10-CM | POA: Diagnosis not present

## 2022-03-23 DIAGNOSIS — G8929 Other chronic pain: Secondary | ICD-10-CM | POA: Diagnosis not present

## 2022-03-23 DIAGNOSIS — M25562 Pain in left knee: Secondary | ICD-10-CM | POA: Diagnosis not present

## 2022-03-23 DIAGNOSIS — M6281 Muscle weakness (generalized): Secondary | ICD-10-CM

## 2022-03-23 NOTE — Therapy (Signed)
?OUTPATIENT PHYSICAL THERAPY TREATMENT NOTE ? ? ?Patient Name: Beth Rodgers ?MRN: [id number] ?DOB:1935-02-22, 87 y.o., female ?Today's Date: 03/23/2022 ? ?PCP: Ria Bush, MD ?REFERRING PROVIDER: Owens Loffler, MD ? ? PT End of Session - 03/23/22 1336   ? ? Visit Number 18   ? Number of Visits 20   ? Date for PT Re-Evaluation 03/27/22   ? PT Start Time 1336   ? PT Stop Time 1416   ? PT Time Calculation (min) 40 min   ? Activity Tolerance Patient tolerated treatment well   ? Behavior During Therapy [facility] for tasks assessed/performed   ? ?  ?  ? ?  ? ? ?Past Medical History:  ?Diagnosis Date  ? Basal cell carcinoma (BCC)   ? txted with MOHs in past  ? Cellulitis 08/10/2018  ? Chronic venous insufficiency   ? with varicose veins  ? Family history of breast cancer   ? Family history of thyroid cancer   ? GERD (gastroesophageal reflux disease)   ? Glaucoma   ? History  of basal cell carcinoma   ? left nare/BREAST CANCER  ? History of chicken pox   ? Hypertension   ? Hypoglycemia   ? Hypothyroid   ? Osteoarthritis   ? back pain, L knee pain  ? Osteopenia 06/2009  ? DEXA 11/2015: T -2.3 hip, -2.2 spine, on longterm bisphosphonate/evista  ? Psoriasis   ? Pyogenic granuloma 04/16/2016  ? ?Past Surgical History:  ?Procedure Laterality Date  ? BASAL CELL CARCINOMA EXCISION    ? located on face  ? BREAST BIOPSY Right   ? core done ing [place]?  ? BREAST BIOPSY Left 08/16/2020  ? 3 area bx 4:00 X, IMC, 6:00Q IMC, LN hydro #3-benign  ? BREAST LUMPECTOMY Left 09/11/2020  ? two areas of [facility]  ? CATARACT EXTRACTION    ? bilateral  ? DEXA  06/2009  ? T score Spine: -1.8, hip -2.0  ? EXCISION OF BREAST BIOPSY Left 09/11/2020  ? Procedure: EXCISION OF BREAST BIOPSY;  Surgeon: [name], MD;  Location: [facility] ORS;  Service: General;  Laterality: Left;  ? Foam sclerotherapy Bilateral 09/2015  ? [place], [place]  ? MASTECTOMY W/ SENTINEL NODE BIOPSY Left 06/25/2021  ? Procedure: MASTECTOMY WITH SENTINEL LYMPH NODE  BIOPSY;  Surgeon: [name], MD;  Location: [facility] ORS;  Service: General;  Laterality: Left;  ? NASAL RECONSTRUCTION  2005  ? for [place] L nare s/p Mohs  ? nuclear stress test  2014  ? normal in [place]  ? PARTIAL MASTECTOMY WITH NEEDLE LOCALIZATION AND AXILLARY SENTINEL LYMPH NODE BX Left 09/11/2020  ? Procedure: PARTIAL MASTECTOMY WITH NEEDLE LOCALIZATION AND AXILLARY SENTINEL LYMPH NODE BX;  Surgeon: [name], MD;  Location: [facility] ORS;  Service: General;  Laterality: Left;  ? RADIOFREQUENCY ABLATION Left 01/2012  ? remnant L G saphenous vein below knee  ? RADIOFREQUENCY ABLATION Right 02/2013  ? RLE vein ablation   ? VEIN LIGATION AND STRIPPING  remote  ? bilateral G saphenous veins  ? ?Patient Active Problem List  ? Diagnosis Date Noted  ? Venous ulcer of ankle, unspecified laterality ([place]) 12/24/2021  ? Bilateral lower extremity edema 12/17/2021  ? Pre-op evaluation 06/02/2021  ? Right wrist pain 02/28/2021  ? Skin rash 02/28/2021  ? Ascending aorta dilatation ([place]) 01/10/2021  ? Left-sided chest pain 01/10/2021  ? Genetic testing 10/30/2020  ? Malignant neoplasm of overlapping sites of left breast in female, estrogen receptor positive ([place]) 10/22/2020  ?  Family history of breast cancer   ? Family history of thyroid cancer   ? Insertional Achilles tendinopathy 08/28/2020  ? Breast cancer, left breast ([place]) 08/01/2020  ? Right carotid bruit 06/05/2020  ? Chest pressure 06/03/2020  ? Left Achilles tendinitis 09/01/2019  ? Low back pain 08/29/2019  ? Lumbosacral spondylosis without myelopathy 08/29/2019  ? Calcaneal spur 08/17/2019  ? Contusion of knee 10/04/2018  ? Varicose veins of bilateral lower extremities with pain 03/30/2018  ? Lymphedema 03/30/2018  ? Dizziness 02/02/2017  ? Situational depression 12/18/2016  ? Vitamin D deficiency 10/11/2016  ? Fatigue 08/12/2016  ? 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 Essential hypertension 12/17/2015  ?  Dermatitis of external ear 12/17/2015  ? [place] of foot 05/16/2015  ? Advanced care planning/counseling discussion 10/30/2014  ? Neck pain on right side 06/25/2014  ? Primary osteoarthritis of left knee 08/17/2013  ? Oropharyngeal dysphagia 11/15/2012  ? Stressful life events affecting family and household 03/30/2012  ? Medicare annual wellness visit, subsequent 03/30/2012  ? History of basal cell carcinoma   ? Osteoarthritis   ? Osteopenia   ? Hypothyroidism   ? Glaucoma   ? Chronic venous insufficiency   ? ? ?REFERRING DIAG: L knee pain ? ?THERAPY DIAG:  ?Chronic pain of left knee ? ?Difficulty in walking, not elsewhere classified ? ?Muscle weakness (generalized) ? ?PERTINENT HISTORY: Pt is a pleasant 87 y.o. female referred to OP PT for L knee pain. PMH includes: HTN, history of breast cancer, chronic venous insufficiency, history of basal cell carcinoma, GERD. Pt reports having bad arthritis in L knee. Pain is worsened with quick movements, too much walking, maintaining knee in a certain position for too long, turning in her sleep hurts. Pain described as sharp and deep and is denying stiffness due to being active with HEP from previous therapy she just finished earlier this week. Pain is improved with consistent exercise and rest. Pt's goal is to improve pain and mobility in L knee and balance/walking tolerance for community ADL's. ? ?PRECAUTIONS: No known precautions ? ?SUBJECTIVE: L knee is not perfect. No pain currently. Usually at the end of the day, moving her legs or changing positions bothers her knee. Sleepy currently.  Was tired after last session.  ? ?PAIN:  ?Are you having pain? No ? ? ? ? ?TODAY'S TREATMENT:  ? ?03/23/2022 ? ?There.ex: ?  ?Mini squats with BUE support with chair behind pt as visual cue for improving hip hinge: 2x8 ? ?Lateral steps with 2# AW's: 2x2, 12' per bout for glut med strength with SBA for balance impairments  ? ?Seated hip add squeeze with green ball: 10x10 ?    ?Hooklying  manually resisted hip abduction, leg straight  ?            L 10x5 seconds for 3 sets ? ?Hooklying hip extension isometrics, leg straight 10x5 seconds for 2 sets ? ? ? ?           ?Improved exercise technique, movement at target joints, use of target muscles after mod verbal, visual, tactile cues.  ? ? ?Response to treatment ?Pt tolerated session well without aggravation of symptoms.  ?  ?  ?  ?  ?Clinical Impression ?Continued working on improving L glute strength and femoral control to help promote better L knee and foot mechanics to decrease stress to affected areas. Pt tolerated session well without aggravation of symptoms. Pt will benefit from continued skilled physical  therapy services to decrease pain, improve strength and function. ?  ? ? ? ? ? ? ?PATIENT EDUCATION: ?Education details: there-ex ?Person educated: Patient ?Education method: Explanation, Demonstration, Tactile cues, and Verbal cues ?Education comprehension: verbalized understanding and returned demonstration ? ? ?HOME EXERCISE PROGRAM: ?Access Code: [email or web address] ?URL: [email or web address] ?Date: 03/23/2022 ?Prepared by: Joneen Boers ? ?Exercises ?- Long Sitting Quad Set with Towel Roll Under Heel  - 1 x daily - 7 x weekly - 3 sets - 15 reps ?- Sit to Stand with Counter Support  - 1 x daily - 3 x weekly - 3 sets - 10 reps ?- Standing Hip Abduction with Counter Support  - 1 x daily - 7 x weekly - 3 sets - 15 reps ? ? PT Short Term Goals - 01/30/22 0858   ? ?  ? PT SHORT TERM GOAL #1  ? Title Pt will be independent with HEP to improve L knee AROM and global strength to improve walking tasks   ? Baseline 01/29/22: Initiated   ? Time 4   ? Period Weeks   ? Status New   ? Target Date 02/26/22   ? ?  ?  ? ?  ? ? ? PT Long Term Goals - 03/02/22 1435   ? ?  ? PT LONG TERM GOAL #1  ? Title Pt will improve knee FOTO to target score to display clinically significant improvement in functional mobility.   ? Baseline 01/29/22: Next session   ? Time 8    ? Period Weeks   ? Status New   ? Target Date 03/26/22   ?  ? PT LONG TERM GOAL #2  ? Title Pt will improve L Knee extension by 5 deg or greater to demonstrate clinically significant improvement for improved gait

## 2022-03-25 ENCOUNTER — Ambulatory Visit: Payer: Medicare Other

## 2022-03-25 DIAGNOSIS — R262 Difficulty in walking, not elsewhere classified: Secondary | ICD-10-CM | POA: Diagnosis not present

## 2022-03-25 DIAGNOSIS — M6281 Muscle weakness (generalized): Secondary | ICD-10-CM | POA: Diagnosis not present

## 2022-03-25 DIAGNOSIS — G8929 Other chronic pain: Secondary | ICD-10-CM

## 2022-03-25 DIAGNOSIS — M25562 Pain in left knee: Secondary | ICD-10-CM | POA: Diagnosis not present

## 2022-03-25 NOTE — Therapy (Signed)
?OUTPATIENT PHYSICAL THERAPY TREATMENT NOTE ? ? ?Patient Name: Beth Rodgers ?MRN: [id number] ?DOB:[birth year], 87 y.o., female ?Today's Date: 03/25/2022 ? ?PCP: Ria Bush, MD ?REFERRING PROVIDER: Owens Loffler, MD ? ? PT End of Session - 03/25/22 1421   ? ? Visit Number 19   ? Number of Visits 20   ? Date for PT Re-Evaluation 03/27/22   ? PT Start Time 1421   ? PT Stop Time 1500   ? PT Time Calculation (min) 39 min   ? Activity Tolerance Patient tolerated treatment well   ? Behavior During Therapy [facility] for tasks assessed/performed   ? ?  ?  ? ?  ? ? ? ?Past Medical History:  ?Diagnosis Date  ? Basal cell carcinoma (BCC)   ? txted with MOHs in past  ? Cellulitis 08/10/2018  ? Chronic venous insufficiency   ? with varicose veins  ? Family history of breast cancer   ? Family history of thyroid cancer   ? GERD (gastroesophageal reflux disease)   ? Glaucoma   ? History  of basal cell carcinoma   ? left nare/BREAST CANCER  ? History of chicken pox   ? Hypertension   ? Hypoglycemia   ? Hypothyroid   ? Osteoarthritis   ? back pain, L knee pain  ? Osteopenia 06/2009  ? DEXA 11/2015: T -2.3 hip, -2.2 spine, on longterm bisphosphonate/evista  ? Psoriasis   ? Pyogenic granuloma 04/16/2016  ? ?Past Surgical History:  ?Procedure Laterality Date  ? BASAL CELL CARCINOMA EXCISION    ? located on face  ? BREAST BIOPSY Right   ? core done ing [place]?  ? BREAST BIOPSY Left 08/16/2020  ? 3 area bx 4:00 X, IMC, 6:00Q IMC, LN hydro #3-benign  ? BREAST LUMPECTOMY Left 09/11/2020  ? two areas of [facility]  ? CATARACT EXTRACTION    ? bilateral  ? DEXA  06/2009  ? T score Spine: -1.8, hip -2.0  ? EXCISION OF BREAST BIOPSY Left 09/11/2020  ? Procedure: EXCISION OF BREAST BIOPSY;  Surgeon: [name], MD;  Location: [facility] ORS;  Service: General;  Laterality: Left;  ? Foam sclerotherapy Bilateral 09/2015  ? Reed Pandy, [place]  ? MASTECTOMY W/ SENTINEL NODE BIOPSY Left 06/25/2021  ? Procedure: MASTECTOMY WITH SENTINEL LYMPH NODE  BIOPSY;  Surgeon: [name], MD;  Location: [facility] ORS;  Service: General;  Laterality: Left;  ? NASAL RECONSTRUCTION  2005  ? for [place] L nare s/p Mohs  ? nuclear stress test  2014  ? normal in [place]  ? PARTIAL MASTECTOMY WITH NEEDLE LOCALIZATION AND AXILLARY SENTINEL LYMPH NODE BX Left 09/11/2020  ? Procedure: PARTIAL MASTECTOMY WITH NEEDLE LOCALIZATION AND AXILLARY SENTINEL LYMPH NODE BX;  Surgeon: [name], MD;  Location: [facility] ORS;  Service: General;  Laterality: Left;  ? RADIOFREQUENCY ABLATION Left 01/2012  ? remnant L G saphenous vein below knee  ? RADIOFREQUENCY ABLATION Right 02/2013  ? RLE vein ablation   ? VEIN LIGATION AND STRIPPING  remote  ? bilateral G saphenous veins  ? ?Patient Active Problem List  ? Diagnosis Date Noted  ? Venous ulcer of ankle, unspecified laterality ([place]) 12/24/2021  ? Bilateral lower extremity edema 12/17/2021  ? Pre-op evaluation 06/02/2021  ? Right wrist pain 02/28/2021  ? Skin rash 02/28/2021  ? Ascending aorta dilatation ([place]) 01/10/2021  ? Left-sided chest pain 01/10/2021  ? Genetic testing 10/30/2020  ? Malignant neoplasm of overlapping sites of left breast in female, estrogen receptor positive ([place]) 10/22/2020  ?  Family history of breast cancer   ? Family history of thyroid cancer   ? Insertional Achilles tendinopathy 08/28/2020  ? Breast cancer, left breast ([place]) 08/01/2020  ? Right carotid bruit 06/05/2020  ? Chest pressure 06/03/2020  ? Left Achilles tendinitis 09/01/2019  ? Low back pain 08/29/2019  ? Lumbosacral spondylosis without myelopathy 08/29/2019  ? Calcaneal spur 08/17/2019  ? Contusion of knee 10/04/2018  ? Varicose veins of bilateral lower extremities with pain 03/30/2018  ? Lymphedema 03/30/2018  ? Dizziness 02/02/2017  ? Situational depression 12/18/2016  ? Vitamin D deficiency 10/11/2016  ? Fatigue 08/12/2016  ? 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 Essential hypertension 12/17/2015  ?  Dermatitis of external ear 12/17/2015  ? Corn of foot 05/16/2015  ? Advanced care planning/counseling discussion 10/30/2014  ? Neck pain on right side 06/25/2014  ? Primary osteoarthritis of left knee 08/17/2013  ? Oropharyngeal dysphagia 11/15/2012  ? Stressful life events affecting family and household 03/30/2012  ? Medicare annual wellness visit, subsequent 03/30/2012  ? History of basal cell carcinoma   ? Osteoarthritis   ? Osteopenia   ? Hypothyroidism   ? Glaucoma   ? Chronic venous insufficiency   ? ? ?REFERRING DIAG: L knee pain ? ?THERAPY DIAG:  ?Chronic pain of left knee ? ?Difficulty in walking, not elsewhere classified ? ?Muscle weakness (generalized) ? ?PERTINENT HISTORY: Pt is a pleasant 87 y.o. female referred to OP PT for L knee pain. PMH includes: HTN, history of breast cancer, chronic venous insufficiency, history of basal cell carcinoma, GERD. Pt reports having bad arthritis in L knee. Pain is worsened with quick movements, too much walking, maintaining knee in a certain position for too long, turning in her sleep hurts. Pain described as sharp and deep and is denying stiffness due to being active with HEP from previous therapy she just finished earlier this week. Pain is improved with consistent exercise and rest. Pt's goal is to improve pain and mobility in L knee and balance/walking tolerance for community ADL's. ? ?PRECAUTIONS: No known precautions ? ?SUBJECTIVE: L knee has been protesting a bit, about 1.5 to 2/10 when she does movements. No pain currently. Was tired after last session.  ? ?PAIN:  ?Are you having pain? No ? ? ? ? ?TODAY'S TREATMENT:  ? ?03/25/2022 ? ?There.ex: ?  ?Lateral steps with 2# AW's: 2x2, 12' per bout for glut med strength with SBA for balance impairments  ? ?Mini squat with B UE assist 1x. L knee pain ? ?Seated clamshells red band, hips less than 90 degrees flexion 10x3 ? ? ?Seated hip add squeeze with green ball: 10x10 seconds ? ?    ?Hooklying hip extension  isometrics, leg straight 10x5 seconds for 2 sets ? ? ? ?Hooklying manually resisted hip abduction, leg straight  ?            L 10x5 seconds for 3 sets ? ? ? ?           ?Improved exercise technique, movement at target joints, use of target muscles after mod verbal, visual, tactile cues.  ? ? ?Manual therapy ?Supine medial rotation L leg grade 1 for pain control  ? ?Supine L knee joint distraction to decrease pressure 9x5 seconds ? ? ? ?Response to treatment ?Decreased L knee pain after manual therapy.  ?  ?  ?  ?  ?Clinical Impression ? ? ?Difficulty with mini squat today secondary to L knee pain. Demonstrates  genu valgus. Eases with sitting rest. Continued working on improving L glute strength and femoral control to help promote better L knee and foot mechanics to decrease stress to affected areas. Decreased L knee pain after gentle manual therapy and decreasing joint pressure. Pt will benefit from continued skilled physical therapy services to decrease pain, improve strength and function. ?  ? ? ? ? ? ? ?PATIENT EDUCATION: ?Education details: there-ex ?Person educated: Patient ?Education method: Explanation, Demonstration, Tactile cues, and Verbal cues ?Education comprehension: verbalized understanding and returned demonstration ? ? ?HOME EXERCISE PROGRAM: ?Access Code: [email or web address] ?URL: [email or web address] ?Date: 03/23/2022 ?Prepared by: Joneen Boers ? ?Exercises ?- Long Sitting Quad Set with Towel Roll Under Heel  - 1 x daily - 7 x weekly - 3 sets - 15 reps ?- Sit to Stand with Counter Support  - 1 x daily - 3 x weekly - 3 sets - 10 reps ?- Standing Hip Abduction with Counter Support  - 1 x daily - 7 x weekly - 3 sets - 15 reps ? ? ? ? ? ? ? PT Short Term Goals - 01/30/22 0858   ? ?  ? PT SHORT TERM GOAL #1  ? Title Pt will be independent with HEP to improve L knee AROM and global strength to improve walking tasks   ? Baseline 01/29/22: Initiated   ? Time 4   ? Period Weeks   ? Status New   ? Target  Date 02/26/22   ? ?  ?  ? ?  ? ? ? PT Long Term Goals - 03/02/22 1435   ? ?  ? PT LONG TERM GOAL #1  ? Title Pt will improve knee FOTO to target score to display clinically significant improvement in functional mobil

## 2022-03-26 ENCOUNTER — Encounter (INDEPENDENT_AMBULATORY_CARE_PROVIDER_SITE_OTHER): Payer: Medicare Other

## 2022-03-26 ENCOUNTER — Ambulatory Visit (INDEPENDENT_AMBULATORY_CARE_PROVIDER_SITE_OTHER): Payer: Medicare Other | Admitting: Vascular Surgery

## 2022-03-31 ENCOUNTER — Ambulatory Visit: Payer: Medicare Other | Attending: Family Medicine

## 2022-03-31 ENCOUNTER — Ambulatory Visit (INDEPENDENT_AMBULATORY_CARE_PROVIDER_SITE_OTHER): Payer: Medicare Other | Admitting: Psychology

## 2022-03-31 DIAGNOSIS — G8929 Other chronic pain: Secondary | ICD-10-CM | POA: Insufficient documentation

## 2022-03-31 DIAGNOSIS — M6281 Muscle weakness (generalized): Secondary | ICD-10-CM | POA: Insufficient documentation

## 2022-03-31 DIAGNOSIS — R262 Difficulty in walking, not elsewhere classified: Secondary | ICD-10-CM | POA: Diagnosis not present

## 2022-03-31 DIAGNOSIS — M25562 Pain in left knee: Secondary | ICD-10-CM | POA: Insufficient documentation

## 2022-03-31 DIAGNOSIS — F4323 Adjustment disorder with mixed anxiety and depressed mood: Secondary | ICD-10-CM | POA: Diagnosis not present

## 2022-03-31 NOTE — Progress Notes (Signed)
[facility] Counselor/Therapist Progress Note ? ?Patient ID: Beth Rodgers, MRN: [id number]   ? ?Date: 03/31/22 ? ?Time Spent: 1:06 pm - 2:02 pm : 56 Minutes ? ?Treatment Type: Individual Therapy. ? ?Reported Symptoms: Rumination, anxiety.   ? ?Mental Status Exam: ?Appearance:  NA     ?Behavior: Appropriate  ?Motor: NA  ?Speech/Language:  Clear and Coherent and Normal Rate  ?Affect: Congruent  ?Mood: dysthymic  ?Thought process: normal  ?Thought content:   WNL  ?Sensory/Perceptual disturbances:   WNL  ?Orientation: oriented to person, place, time/date, and situation  ?Attention: Good  ?Concentration: Good  ?Memory: WNL  ?Fund of knowledge:  Good  ?Insight:   Good  ?Judgment:  Good  ?Impulse Control: Good  ? ?Risk Assessment: ?Danger to Self:  No ?Self-injurious Behavior: No  ?Danger to Others: No ?Duty to Warn:no ?Physical Aggression / Violence:No  ?Access to Firearms a concern: No  ?Gang Involvement:No  ? ?Subjective:  ? ?Beth Rodgers participated from home, via phone, and consented to treatment. Therapist participated from office. We met online due to [place] pandemic. Beth Rodgers reviewed the events of the past two weeks. Beth Rodgers noted feelings of sadness due to the upcoming holidays and noted having to miss this time with her grandchildren. We explored her feelings including feelings of sadness. We reviewed her coping, during the session. She noted trying to "protect" herself from being hurt and focus on positives in her day-to-day life. We began processing this during the session. She discussed feeling disconnected from other grand-children due to her son's lack of facilitation of communication. We processed this and her attempts to resolve this. Therapist praised Beth Rodgers's efforts to manage her mood and set boundaries regarding her worries. Therapist praised Beth Rodgers's efforts to think positively. Therapist validated Beth Rodgers's feelings and provided supportive therapy. ?  ?Interventions: Interpersonal and  ACT ? ?Diagnosis: Adjustment disorder with mixed anxiety and depressed mood ? ?Plan: Patient is to use CBT, BA, Problem-solving, mindfulness and coping skills to help manage decrease symptoms associated with their diagnosis. (Target date: 08/16/22) ?  ?Long-term goal:   ?Reduce overall level, frequency, and intensity of the feelings of depression and anxiety evidenced by   decreased sadness, rumination, lethargy, and interpersonal stressors  from 6 to 7 days/week to 0 to 1 days/week per client report for at least 3 consecutive months. ? ?Short-term goal:  ?Verbally express understanding of the relationship between feelings of depression, anxiety and their impact on thinking patterns and behaviors. ? ?Verbalize an understanding of the role that distorted thinking plays in creating fears, excessive worry, and ruminations. ? ?Engage in enjoyable activities on a consistent basis. ? ?Beth Irish, LCSW ?

## 2022-03-31 NOTE — Therapy (Signed)
?OUTPATIENT PHYSICAL THERAPY TREATMENT NOTE ?And ?Progress Report  ?(03/02/2022 - 03/31/2022) ? ? ?Patient Name: Beth Rodgers ?MRN: [id number] ?DOB:1935/04/16, 87 y.o., female ?Today's Date: 03/31/2022 ? ?PCP: Ria Bush, MD ?REFERRING PROVIDER: Owens Loffler, MD ? ? PT End of Session - 03/31/22 1102   ? ? Visit Number 20   ? Number of Visits 28   ? Date for PT Re-Evaluation 04/30/22   ? PT Start Time 1102   ? PT Stop Time 6767   ? PT Time Calculation (min) 41 min   ? Activity Tolerance Patient tolerated treatment well   ? Behavior During Therapy [facility] for tasks assessed/performed   ? ?  ?  ? ?  ? ? ? ? ?Past Medical History:  ?Diagnosis Date  ? Basal cell carcinoma (BCC)   ? txted with MOHs in past  ? Cellulitis 08/10/2018  ? Chronic venous insufficiency   ? with varicose veins  ? Family history of breast cancer   ? Family history of thyroid cancer   ? GERD (gastroesophageal reflux disease)   ? Glaucoma   ? History  of basal cell carcinoma   ? left nare/BREAST CANCER  ? History of chicken pox   ? Hypertension   ? Hypoglycemia   ? Hypothyroid   ? Osteoarthritis   ? back pain, L knee pain  ? Osteopenia 06/2009  ? DEXA 11/2015: T -2.3 hip, -2.2 spine, on longterm bisphosphonate/evista  ? Psoriasis   ? Pyogenic granuloma 04/16/2016  ? ?Past Surgical History:  ?Procedure Laterality Date  ? BASAL CELL CARCINOMA EXCISION    ? located on face  ? BREAST BIOPSY Right   ? core done ing [place]?  ? BREAST BIOPSY Left 08/16/2020  ? 3 area bx 4:00 X, IMC, 6:00Q IMC, LN hydro #3-benign  ? BREAST LUMPECTOMY Left 09/11/2020  ? two areas of [facility]  ? CATARACT EXTRACTION    ? bilateral  ? DEXA  06/2009  ? T score Spine: -1.8, hip -2.0  ? EXCISION OF BREAST BIOPSY Left 09/11/2020  ? Procedure: EXCISION OF BREAST BIOPSY;  Surgeon: [name], MD;  Location: [facility] ORS;  Service: General;  Laterality: Left;  ? Foam sclerotherapy Bilateral 09/2015  ? Reed P[place], [place]  ? MASTECTOMY W/ SENTINEL NODE BIOPSY Left 06/25/2021  ?  Procedure: MASTECTOMY WITH SENTINEL LYMPH NODE BIOPSY;  Surgeon: [name], MD;  Location: [facility] ORS;  Service: General;  Laterality: Left;  ? NASAL RECONSTRUCTION  2005  ? for [place] L nare s/p Mohs  ? nuclear stress test  2014  ? normal in [place]  ? PARTIAL MASTECTOMY WITH NEEDLE LOCALIZATION AND AXILLARY SENTINEL LYMPH NODE BX Left 09/11/2020  ? Procedure: PARTIAL MASTECTOMY WITH NEEDLE LOCALIZATION AND AXILLARY SENTINEL LYMPH NODE BX;  Surgeon: [name], MD;  Location: [facility] ORS;  Service: General;  Laterality: Left;  ? RADIOFREQUENCY ABLATION Left 01/2012  ? remnant L G saphenous vein below knee  ? RADIOFREQUENCY ABLATION Right 02/2013  ? RLE vein ablation   ? VEIN LIGATION AND STRIPPING  remote  ? bilateral G saphenous veins  ? ?Patient Active Problem List  ? Diagnosis Date Noted  ? Venous ulcer of ankle, unspecified laterality ([place]) 12/24/2021  ? Bilateral lower extremity edema 12/17/2021  ? Pre-op evaluation 06/02/2021  ? Right wrist pain 02/28/2021  ? Skin rash 02/28/2021  ? Ascending aorta dilatation ([place]) 01/10/2021  ? Left-sided chest pain 01/10/2021  ? Genetic testing 10/30/2020  ? Malignant neoplasm of overlapping sites of left  breast in female, estrogen receptor positive ([place]) 10/22/2020  ? Family history of breast cancer   ? Family history of thyroid cancer   ? Insertional Achilles tendinopathy 08/28/2020  ? Breast cancer, left breast ([place]) 08/01/2020  ? Right carotid bruit 06/05/2020  ? Chest pressure 06/03/2020  ? Left Achilles tendinitis 09/01/2019  ? Low back pain 08/29/2019  ? Lumbosacral spondylosis without myelopathy 08/29/2019  ? Calcaneal spur 08/17/2019  ? Contusion of knee 10/04/2018  ? Varicose veins of bilateral lower extremities with pain 03/30/2018  ? Lymphedema 03/30/2018  ? Dizziness 02/02/2017  ? Situational depression 12/18/2016  ? Vitamin D deficiency 10/11/2016  ? Fatigue 08/12/2016  ? 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 Essential hypertension 12/17/2015  ? Dermatitis of external ear 12/17/2015  ? Corn of foot 05/16/2015  ? Advanced care planning/counseling discussion 10/30/2014  ? Neck pain on right side 06/25/2014  ? Primary osteoarthritis of left knee 08/17/2013  ? Oropharyngeal dysphagia 11/15/2012  ? Stressful life events affecting family and household 03/30/2012  ? Medicare annual wellness visit, subsequent 03/30/2012  ? History of basal cell carcinoma   ? Osteoarthritis   ? Osteopenia   ? Hypothyroidism   ? Glaucoma   ? Chronic venous insufficiency   ? ? ?REFERRING DIAG: L knee pain ? ?THERAPY DIAG:  ?Chronic pain of left knee - Plan: PT plan of care cert/re-cert ? ?Difficulty in walking, not elsewhere classified - Plan: PT plan of care cert/re-cert ? ?Muscle weakness (generalized) - Plan: PT plan of care cert/re-cert ? ?PERTINENT HISTORY: Pt is a pleasant 87 y.o. female referred to OP PT for L knee pain. PMH includes: HTN, history of breast cancer, chronic venous insufficiency, history of basal cell carcinoma, GERD. Pt reports having bad arthritis in L knee. Pain is worsened with quick movements, too much walking, maintaining knee in a certain position for too long, turning in her sleep hurts. Pain described as sharp and deep and is denying stiffness due to being active with HEP from previous therapy she just finished earlier this week. Pain is improved with consistent exercise and rest. Pt's goal is to improve pain and mobility in L knee and balance/walking tolerance for community ADL's. ? ?PRECAUTIONS: No known precautions ? ?SUBJECTIVE: L knee reacts once in a while again. It hurts more than a few days ago. Walking is ok for the knee.Getting out of the car bothers her knee and pt has to hold it with her hands and picks it up to bring it out of the car.  2.5/10 at most for a very short moment at worst for the past 7 days. Thinks the PT is helping her knee. The injection is not helping with the cushioning of the bones.  The condition of her L knee is a limiting factor with her progress. Feels like she is walking a little faster at home from her door to her car and inside her house. The leg is getting stronger. The knee is sore. Still feels like she needs more PT to help strengthen her muscles. Feels much safer when she walks to the bathroom in the middle of the night.  ? ? ?PAIN:  ?Are you having pain? No ? ? ? ? ?TODAY'S TREATMENT:  ? ? ?Manual therapy   ?Supine STM B hamstrings to promote leg extension AROM ? ?Supine L knee joint distraction to decrease pressure ? ? ?Supine medial rotation L leg grade 1 for pain control  ?There-ex: ?  ?  Gait x 1:21 minutes  ? 200 ft, no AD ? ?Seated L knee extension AROM -16 degrees ? ? ?Supine quad set with gentle overpressure with PT 10x5 seconds to promote knee extension AROM  ? ?Supine L knee extension AROM: -5 degrees ? ?Reviewed plan of care: continue another 2x/week for 4 weeks ? ?Sit <> stand 5x2 ? 16 seconds, then 16 seconds with B UE assist ? ? ? ? ? ? ?           ?Improved exercise technique, movement at target joints, use of target muscles after mod verbal, visual, tactile cues.  ? ? ? ? ?Response to treatment ?Pt tolerated session well without aggravation of symptoms.  ?  ?  ?  ?  ?Clinical Impression ?Pt demonstrates overall improved function based on subjective reports of decreased difficulty with gait from her door to her car as well as ambulating to the bathroom in the middle of the night. Pt also demonstrates slightly increased 5 times sit to stand time compared to previous measurement but overall still faster than initial eval. Still demonstrates limited L knee extension AROM. PT tolerated session well without aggravation of symptoms. Pt will benefit from continued skilled physical therapy services to decrease pain, improve strength and function.  ?  ? ? ? ? ? ? ?PATIENT EDUCATION: ?Education details: there-ex ?Person educated: Patient ?Education method: Explanation,  Demonstration, Tactile cues, and Verbal cues ?Education comprehension: verbalized understanding and returned demonstration ? ? ?HOME EXERCISE PROGRAM: ?Access Code: [email or web address] ?URL: [email or web address]

## 2022-04-01 ENCOUNTER — Ambulatory Visit: Payer: Medicare Other

## 2022-04-02 ENCOUNTER — Ambulatory Visit: Payer: Medicare Other

## 2022-04-02 DIAGNOSIS — M6281 Muscle weakness (generalized): Secondary | ICD-10-CM

## 2022-04-02 DIAGNOSIS — R262 Difficulty in walking, not elsewhere classified: Secondary | ICD-10-CM

## 2022-04-02 DIAGNOSIS — G8929 Other chronic pain: Secondary | ICD-10-CM | POA: Diagnosis not present

## 2022-04-02 DIAGNOSIS — M25562 Pain in left knee: Secondary | ICD-10-CM | POA: Diagnosis not present

## 2022-04-02 NOTE — Therapy (Signed)
?OUTPATIENT PHYSICAL THERAPY TREATMENT NOTE ? ? ? ?Patient Name: Beth Rodgers ?MRN: [id number] ?DOB:[birth year], 87 y.o., female ?Today's Date: 04/02/2022 ? ?PCP: Ria Bush, MD ?REFERRING PROVIDER: Owens Loffler, MD ? ? PT End of Session - 04/02/22 0804   ? ? Visit Number 21   ? Number of Visits 28   ? Date for PT Re-Evaluation 04/30/22   ? PT Start Time [phone]   ? PT Stop Time 4462   ? PT Time Calculation (min) 42 min   ? Activity Tolerance Patient tolerated treatment well   ? Behavior During Therapy [facility] for tasks assessed/performed   ? ?  ?  ? ?  ? ? ? ? ? ?Past Medical History:  ?Diagnosis Date  ? Basal cell carcinoma (BCC)   ? txted with MOHs in past  ? Cellulitis 08/10/2018  ? Chronic venous insufficiency   ? with varicose veins  ? Family history of breast cancer   ? Family history of thyroid cancer   ? GERD (gastroesophageal reflux disease)   ? Glaucoma   ? History  of basal cell carcinoma   ? left nare/BREAST CANCER  ? History of chicken pox   ? Hypertension   ? Hypoglycemia   ? Hypothyroid   ? Osteoarthritis   ? back pain, L knee pain  ? Osteopenia 06/2009  ? DEXA 11/2015: T -2.3 hip, -2.2 spine, on longterm bisphosphonate/evista  ? Psoriasis   ? Pyogenic granuloma 04/16/2016  ? ?Past Surgical History:  ?Procedure Laterality Date  ? BASAL CELL CARCINOMA EXCISION    ? located on face  ? BREAST BIOPSY Right   ? core done ing [place]?  ? BREAST BIOPSY Left 08/16/2020  ? 3 area bx 4:00 X, IMC, 6:00Q IMC, LN hydro #3-benign  ? BREAST LUMPECTOMY Left 09/11/2020  ? two areas of [facility]  ? CATARACT EXTRACTION    ? bilateral  ? DEXA  06/2009  ? T score Spine: -1.8, hip -2.0  ? EXCISION OF BREAST BIOPSY Left 09/11/2020  ? Procedure: EXCISION OF BREAST BIOPSY;  Surgeon: [name], MD;  Location: [facility] ORS;  Service: General;  Laterality: Left;  ? Foam sclerotherapy Bilateral 09/2015  ? [place] Pandy, [place]  ? MASTECTOMY W/ SENTINEL NODE BIOPSY Left 06/25/2021  ? Procedure: MASTECTOMY WITH SENTINEL LYMPH  NODE BIOPSY;  Surgeon: [name], MD;  Location: [facility] ORS;  Service: General;  Laterality: Left;  ? NASAL RECONSTRUCTION  2005  ? for [place] L nare s/p Mohs  ? nuclear stress test  2014  ? normal in [place]  ? PARTIAL MASTECTOMY WITH NEEDLE LOCALIZATION AND AXILLARY SENTINEL LYMPH NODE BX Left 09/11/2020  ? Procedure: PARTIAL MASTECTOMY WITH NEEDLE LOCALIZATION AND AXILLARY SENTINEL LYMPH NODE BX;  Surgeon: [name], MD;  Location: [facility] ORS;  Service: General;  Laterality: Left;  ? RADIOFREQUENCY ABLATION Left 01/2012  ? remnant L G saphenous vein below knee  ? RADIOFREQUENCY ABLATION Right 02/2013  ? RLE vein ablation   ? VEIN LIGATION AND STRIPPING  remote  ? bilateral G saphenous veins  ? ?Patient Active Problem List  ? Diagnosis Date Noted  ? Venous ulcer of ankle, unspecified laterality ([place]) 12/24/2021  ? Bilateral lower extremity edema 12/17/2021  ? Pre-op evaluation 06/02/2021  ? Right wrist pain 02/28/2021  ? Skin rash 02/28/2021  ? Ascending aorta dilatation ([place]) 01/10/2021  ? Left-sided chest pain 01/10/2021  ? Genetic testing 10/30/2020  ? Malignant neoplasm of overlapping sites of left breast in female, estrogen receptor  positive ([place]) 10/22/2020  ? Family history of breast cancer   ? Family history of thyroid cancer   ? Insertional Achilles tendinopathy 08/28/2020  ? Breast cancer, left breast ([place]) 08/01/2020  ? Right carotid bruit 06/05/2020  ? Chest pressure 06/03/2020  ? Left Achilles tendinitis 09/01/2019  ? Low back pain 08/29/2019  ? Lumbosacral spondylosis without myelopathy 08/29/2019  ? Calcaneal spur 08/17/2019  ? Contusion of knee 10/04/2018  ? Varicose veins of bilateral lower extremities with pain 03/30/2018  ? Lymphedema 03/30/2018  ? Dizziness 02/02/2017  ? Situational depression 12/18/2016  ? Vitamin D deficiency 10/11/2016  ? Fatigue 08/12/2016  ? 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 Essential hypertension 12/17/2015  ?  Dermatitis of external ear 12/17/2015  ? Corn of foot 05/16/2015  ? Advanced care planning/counseling discussion 10/30/2014  ? Neck pain on right side 06/25/2014  ? Primary osteoarthritis of left knee 08/17/2013  ? Oropharyngeal dysphagia 11/15/2012  ? Stressful life events affecting family and household 03/30/2012  ? Medicare annual wellness visit, subsequent 03/30/2012  ? History of basal cell carcinoma   ? Osteoarthritis   ? Osteopenia   ? Hypothyroidism   ? Glaucoma   ? Chronic venous insufficiency   ? ? ?REFERRING DIAG: L knee pain ? ?THERAPY DIAG:  ?Chronic pain of left knee ? ?Difficulty in walking, not elsewhere classified ? ?Muscle weakness (generalized) ? ?PERTINENT HISTORY: Pt is a pleasant 87 y.o. female referred to OP PT for L knee pain. PMH includes: HTN, history of breast cancer, chronic venous insufficiency, history of basal cell carcinoma, GERD. Pt reports having bad arthritis in L knee. Pain is worsened with quick movements, too much walking, maintaining knee in a certain position for too long, turning in her sleep hurts. Pain described as sharp and deep and is denying stiffness due to being active with HEP from previous therapy she just finished earlier this week. Pain is improved with consistent exercise and rest. Pt's goal is to improve pain and mobility in L knee and balance/walking tolerance for community ADL's. ? ?PRECAUTIONS: No known precautions ? ?SUBJECTIVE: Abused L knee yesterday... walked more than normal. Went to a park in [place] but it was worth it. L Achilles tendon was burning, about 2/10 and 1.5/10 L knee pain currently when walking.  ? ? ?PAIN:  ?Are you having pain? Yes. See subjective ? ? ? ? ?TODAY'S TREATMENT:  ? ? ?Manual therapy ? ?Seated gentle L knee joint distraction to decrease joint pressure ? ?Supine medial rotation L leg grade 1 for pain control  ? Feels good per pt ? ? ?Ther-ex: ?  ? ?Supine quad set with gentle overpressure with PT 10x5 seconds, then 9x5  seconds to promote knee extension AROM  ? ?Seated trunk flexion stretch 5x10 seconds  ? ?Seated B hip adduction isometrics, small green ball squeeze 10x10 seconds  ? Decreased L lateral knee joint pressure, decreased pain.  ? ?Standing L hip abduction with B UE assist 10x2 with 2 lbs ankle weight to promote glute med muscle strengthening. ? ?Standing glute sets with B UE assist 10x10 seconds ? ? ? ?           ?Improved exercise technique, movement at target joints, use of target muscles after mod verbal, visual, tactile cues.  ? ? ? ? ?Response to treatment ?Fair tolerance to today's session.   ?  ?  ?  ?  ?Clinical Impression ? ?Worked on decreasing L  knee joint pressure, pain, improve knee extension ROM and glute strength  to decrease stress to L lateral knee. Fair tolerance to today's session secondary to aggravation of L knee pain during yesterday's walk in the park. Pt will benefit from continued skilled physical therapy services to decrease pain, improve strength and function.  ?  ? ? ? ? ? ? ?PATIENT EDUCATION: ?Education details: there-ex ?Person educated: Patient ?Education method: Explanation, Demonstration, Tactile cues, and Verbal cues ?Education comprehension: verbalized understanding and returned demonstration ? ? ?HOME EXERCISE PROGRAM: ?Access Code: [email or web address] ?URL: [email or web address] ?Date: 03/23/2022 ?Prepared by: Joneen Boers ? ?Exercises ?- Long Sitting Quad Set with Towel Roll Under Heel  - 1 x daily - 7 x weekly - 3 sets - 15 reps ?- Sit to Stand with Counter Support  - 1 x daily - 3 x weekly - 3 sets - 10 reps ?- Standing Hip Abduction with Counter Support  - 1 x daily - 7 x weekly - 3 sets - 15 reps ? ? ? ? ? ? PT Short Term Goals - 03/31/22 1107   ? ?  ? PT SHORT TERM GOAL #1  ? Title Pt will be independent with HEP to improve L knee AROM and global strength to improve walking tasks   ? Baseline 01/29/22: Initiated; Doing her exercises a little bit (03/31/2022)   ? Time 4   ?  Period Weeks   ? Status Partially Met   ? Target Date 02/26/22   ? ?  ?  ? ?  ? ? ? PT Long Term Goals - 03/31/22 1107   ? ?  ? PT LONG TERM GOAL #1  ? Title Pt will improve knee FOTO to target score to display c

## 2022-04-06 ENCOUNTER — Telehealth (INDEPENDENT_AMBULATORY_CARE_PROVIDER_SITE_OTHER): Payer: Self-pay | Admitting: Vascular Surgery

## 2022-04-06 NOTE — Telephone Encounter (Signed)
Patient called and stated her appointment was pushed back to May 11 but she need to have her LE Venous Reflux done sooner due to her needing to be cleared for knee surgery.  Please advise. ?

## 2022-04-06 NOTE — Telephone Encounter (Signed)
I spoke with the patient and she informed that she has an appointment schedule with her orthopedic provider discuss knee surgery. Patient was made aware that appointment was reschedule due to being a down a provider. Patient verbalized understanding and is fine with upcoming appointment on 05/07/22. ?

## 2022-04-07 ENCOUNTER — Ambulatory Visit: Payer: Medicare Other

## 2022-04-07 DIAGNOSIS — M6281 Muscle weakness (generalized): Secondary | ICD-10-CM

## 2022-04-07 DIAGNOSIS — M25562 Pain in left knee: Secondary | ICD-10-CM | POA: Diagnosis not present

## 2022-04-07 DIAGNOSIS — R262 Difficulty in walking, not elsewhere classified: Secondary | ICD-10-CM

## 2022-04-07 DIAGNOSIS — M1712 Unilateral primary osteoarthritis, left knee: Secondary | ICD-10-CM | POA: Diagnosis not present

## 2022-04-07 DIAGNOSIS — G8929 Other chronic pain: Secondary | ICD-10-CM

## 2022-04-07 NOTE — Therapy (Signed)
?OUTPATIENT PHYSICAL THERAPY TREATMENT NOTE ? ? ? ?Patient Name: Beth Rodgers ?MRN: [id number] ?DOB:1935-09-30, 87 y.o., female ?Today's Date: 04/07/2022 ? ?PCP: Ria Bush, MD ?REFERRING PROVIDER: Owens Loffler, MD ? ? PT End of Session - 04/07/22 1339   ? ? Visit Number 22   ? Number of Visits 28   ? Date for PT Re-Evaluation 04/30/22   ? PT Start Time 1340   pt arrived late  ? PT Stop Time 1420   ? PT Time Calculation (min) 40 min   ? Activity Tolerance Patient tolerated treatment well   ? Behavior During Therapy [facility] for tasks assessed/performed   ? ?  ?  ? ?  ? ? ? ? ? ? ?Past Medical History:  ?Diagnosis Date  ? Basal cell carcinoma (BCC)   ? txted with MOHs in past  ? Cellulitis 08/10/2018  ? Chronic venous insufficiency   ? with varicose veins  ? Family history of breast cancer   ? Family history of thyroid cancer   ? GERD (gastroesophageal reflux disease)   ? Glaucoma   ? History  of basal cell carcinoma   ? left nare/BREAST CANCER  ? History of chicken pox   ? Hypertension   ? Hypoglycemia   ? Hypothyroid   ? Osteoarthritis   ? back pain, L knee pain  ? Osteopenia 06/2009  ? DEXA 11/2015: T -2.3 hip, -2.2 spine, on longterm bisphosphonate/evista  ? Psoriasis   ? Pyogenic granuloma 04/16/2016  ? ?Past Surgical History:  ?Procedure Laterality Date  ? BASAL CELL CARCINOMA EXCISION    ? located on face  ? BREAST BIOPSY Right   ? core done ing [place]?  ? BREAST BIOPSY Left 08/16/2020  ? 3 area bx 4:00 X, IMC, 6:00Q IMC, LN hydro #3-benign  ? BREAST LUMPECTOMY Left 09/11/2020  ? two areas of [facility]  ? CATARACT EXTRACTION    ? bilateral  ? DEXA  06/2009  ? T score Spine: -1.8, hip -2.0  ? EXCISION OF BREAST BIOPSY Left 09/11/2020  ? Procedure: EXCISION OF BREAST BIOPSY;  Surgeon: [name], MD;  Location: [facility] ORS;  Service: General;  Laterality: Left;  ? Foam sclerotherapy Bilateral 09/2015  ? Reed P[place], [place]  ? MASTECTOMY W/ SENTINEL NODE BIOPSY Left 06/25/2021  ? Procedure: MASTECTOMY  WITH SENTINEL LYMPH NODE BIOPSY;  Surgeon: [name], MD;  Location: [facility] ORS;  Service: General;  Laterality: Left;  ? NASAL RECONSTRUCTION  2005  ? for [place] L nare s/p Mohs  ? nuclear stress test  2014  ? normal in [place]  ? PARTIAL MASTECTOMY WITH NEEDLE LOCALIZATION AND AXILLARY SENTINEL LYMPH NODE BX Left 09/11/2020  ? Procedure: PARTIAL MASTECTOMY WITH NEEDLE LOCALIZATION AND AXILLARY SENTINEL LYMPH NODE BX;  Surgeon: [name], MD;  Location: [facility] ORS;  Service: General;  Laterality: Left;  ? RADIOFREQUENCY ABLATION Left 01/2012  ? remnant L G saphenous vein below knee  ? RADIOFREQUENCY ABLATION Right 02/2013  ? RLE vein ablation   ? VEIN LIGATION AND STRIPPING  remote  ? bilateral G saphenous veins  ? ?Patient Active Problem List  ? Diagnosis Date Noted  ? Venous ulcer of ankle, unspecified laterality ([place]) 12/24/2021  ? Bilateral lower extremity edema 12/17/2021  ? Pre-op evaluation 06/02/2021  ? Right wrist pain 02/28/2021  ? Skin rash 02/28/2021  ? Ascending aorta dilatation ([place]) 01/10/2021  ? Left-sided chest pain 01/10/2021  ? Genetic testing 10/30/2020  ? Malignant neoplasm of overlapping sites of left  breast in female, estrogen receptor positive ([place]) 10/22/2020  ? Family history of breast cancer   ? Family history of thyroid cancer   ? Insertional Achilles tendinopathy 08/28/2020  ? Breast cancer, left breast ([place]) 08/01/2020  ? Right carotid bruit 06/05/2020  ? Chest pressure 06/03/2020  ? Left Achilles tendinitis 09/01/2019  ? Low back pain 08/29/2019  ? Lumbosacral spondylosis without myelopathy 08/29/2019  ? Calcaneal spur 08/17/2019  ? Contusion of knee 10/04/2018  ? Varicose veins of bilateral lower extremities with pain 03/30/2018  ? Lymphedema 03/30/2018  ? Dizziness 02/02/2017  ? Situational depression 12/18/2016  ? Vitamin D deficiency 10/11/2016  ? Fatigue 08/12/2016  ? 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 Essential  hypertension 12/17/2015  ? Dermatitis of external ear 12/17/2015  ? Corn of foot 05/16/2015  ? Advanced care planning/counseling discussion 10/30/2014  ? Neck pain on right side 06/25/2014  ? Primary osteoarthritis of left knee 08/17/2013  ? Oropharyngeal dysphagia 11/15/2012  ? Stressful life events affecting family and household 03/30/2012  ? Medicare annual wellness visit, subsequent 03/30/2012  ? History of basal cell carcinoma   ? Osteoarthritis   ? Osteopenia   ? Hypothyroidism   ? Glaucoma   ? Chronic venous insufficiency   ? ? ?REFERRING DIAG: L knee pain ? ?THERAPY DIAG:  ?Chronic pain of left knee ? ?Difficulty in walking, not elsewhere classified ? ?Muscle weakness (generalized) ? ?PERTINENT HISTORY: Pt is a pleasant 87 y.o. female referred to OP PT for L knee pain. PMH includes: HTN, history of breast cancer, chronic venous insufficiency, history of basal cell carcinoma, GERD. Pt reports having bad arthritis in L knee. Pain is worsened with quick movements, too much walking, maintaining knee in a certain position for too long, turning in her sleep hurts. Pain described as sharp and deep and is denying stiffness due to being active with HEP from previous therapy she just finished earlier this week. Pain is improved with consistent exercise and rest. Pt's goal is to improve pain and mobility in L knee and balance/walking tolerance for community ADL's. ? ?PRECAUTIONS: No known precautions ? ?SUBJECTIVE:  L knee bothered her a couple of times. Has to lift her L leg to get into and out of the car.  ? ? ? ? ?PAIN:  ?Are you having pain? Did not notice L knee pain during gait currently but bothered her this morning several times.  ? ? ? ? ?TODAY'S TREATMENT:  ? ? ?Ther-ex: ?  ?Seated L leg IR 10x5 seconds, then 8x5 seconds  ? ?Seated hip ER  ? L 5x5 seconds for 2 sets ? ?Seated clamshells red band, hips less than 90 degrees flexion 10x2 to promote glute med muscle strengthening.  ? Towel padding for  band ? ?Seated B hip adduction isometrics, small green ball squeeze 10x10 seconds for 2 sets ?  ?Seated manually resisted hip extension, towel padding.  ? R 10x2 ? L 10x2 ? ?Standing L hip abduction with B UE assist 10x, then 7x with 2 lbs ankle weight to promote glute med muscle strengthening. ? R knee pain, eases with rest. ? ?Seated trunk flexion stretch 5x20 seconds  ? ?Seated L knee flexion manually resisted targeting medial hamstrings 10x2 ? ? ?Pt was recommended to try a hinged knee brace with a patellar cutout to help decrease L knee valgus during gait. Pt verbalized understanding. Pt mentioned wearing a knee brace before helped with knee pain.  ? ?           ?  Improved exercise technique, movement at target joints, use of target muscles after mod verbal, visual, tactile cues.  ? ? ? ? ?Response to treatment ?No L knee pain reported during gait after session.  ?  ?  ?  ?  ?Clinical Impression ?Worked on improving glute med, max, and L medial hamstring muscle strength to decrease L knee valgus and promote better mechanics at the joint during closed chain tasks. No L knee pain reported during gait after session. Pt will benefit from continued skilled physical therapy services to decrease pain, improve strength and function.  ?  ? ? ? ? ? ? ?PATIENT EDUCATION: ?Education details: there-ex ?Person educated: Patient ?Education method: Explanation, Demonstration, Tactile cues, and Verbal cues ?Education comprehension: verbalized understanding and returned demonstration ? ? ?HOME EXERCISE PROGRAM: ?Access Code: [email or web address] ?URL: [email or web address] ?Date: 03/23/2022 ?Prepared by: Joneen Boers ? ?Exercises ?- Long Sitting Quad Set with Towel Roll Under Heel  - 1 x daily - 7 x weekly - 3 sets - 15 reps ?- Sit to Stand with Counter Support  - 1 x daily - 3 x weekly - 3 sets - 10 reps ?- Standing Hip Abduction with Counter Support  - 1 x daily - 7 x weekly - 3 sets - 15 reps ? ? ? ? ? ? PT Short Term Goals  - 03/31/22 1107   ? ?  ? PT SHORT TERM GOAL #1  ? Title Pt will be independent with HEP to improve L knee AROM and global strength to improve walking tasks   ? Baseline 01/29/22: Initiated; Doing her exercises a little bit

## 2022-04-09 ENCOUNTER — Ambulatory Visit: Payer: Medicare Other

## 2022-04-14 ENCOUNTER — Ambulatory Visit (INDEPENDENT_AMBULATORY_CARE_PROVIDER_SITE_OTHER): Payer: Medicare Other | Admitting: Psychology

## 2022-04-14 DIAGNOSIS — F4323 Adjustment disorder with mixed anxiety and depressed mood: Secondary | ICD-10-CM | POA: Diagnosis not present

## 2022-04-14 NOTE — Progress Notes (Signed)
[place] Counselor/Therapist Progress Note ? ?Patient ID: Beth Rodgers, MRN: [id number]   ? ?Date: 04/14/22 ? ?Time Spent: 1:01 pm - 1:52 pm : 51 Minutes ? ?Treatment Type: Individual Therapy. ? ?Reported Symptoms: Rumination, anxiety.   ? ?Mental Status Exam: ?Appearance:  NA     ?Behavior: Appropriate  ?Motor: NA  ?Speech/Language:  Clear and Coherent and Normal Rate  ?Affect: Congruent  ?Mood: dysthymic  ?Thought process: normal  ?Thought content:   WNL  ?Sensory/Perceptual disturbances:   WNL  ?Orientation: oriented to person, place, time/date, and situation  ?Attention: Good  ?Concentration: Good  ?Memory: WNL  ?Fund of knowledge:  Good  ?Insight:   Good  ?Judgment:  Good  ?Impulse Control: Good  ? ?Risk Assessment: ?Danger to Self:  No ?Self-injurious Behavior: No  ?Danger to Others: No ?Duty to Warn:no ?Physical Aggression / Violence:No  ?Access to Firearms a concern: No  ?Gang Involvement:No  ? ?Subjective:  ? ?Beth Rodgers participated from home, via phone, and consented to treatment. Therapist participated from office. We met online due to Beth Rodgers pandemic. Beth Rodgers reviewed the events of the past two weeks. Beth Rodgers noted the decline in her son Beth Rodgers's functioning. She noted her increased worry regarding her son's overall wellness and functioning. Beth Rodgers noted her continued worry regarding her grandchildren and her worry regarding her grandson's health due to a complicated medical history. She became tearful regarding his wasted potential. We explored this during the session and the effect on her overall health and mood. Therapist provided support group resources for parents of bipolar and mentally ill individuals. Therapist praised Beth Rodgers efforts to manage her mood and set boundaries regarding her worries. Therapist praised Beth Rodgers efforts to think positivel. Therapist validated Beth Rodgers feelings and provided supportive therapy. ?  ?Interventions: Psycho-education/Bibliotherapy ? ?Diagnosis: Adjustment  disorder with mixed anxiety and depressed mood ? ?Plan: Patient is to use CBT, BA, Problem-solving, mindfulness and coping skills to help manage decrease symptoms associated with their diagnosis. (Target date: 08/16/22) ?  ?Long-term goal:   ?Reduce overall level, frequency, and intensity of the feelings of depression and anxiety evidenced by   decreased sadness, rumination, lethargy, and interpersonal stressors  from 6 to 7 days/week to 0 to 1 days/week per client report for at least 3 consecutive months. ? ?Short-term goal:  ?Verbally express understanding of the relationship between feelings of depression, anxiety and their impact on thinking patterns and behaviors. ? ?Verbalize an understanding of the role that distorted thinking plays in creating fears, excessive worry, and ruminations. ? ?Engage in enjoyable activities on a consistent basis. ? ?Beth Irish, LCSW ?

## 2022-04-15 ENCOUNTER — Inpatient Hospital Stay: Payer: Medicare Other | Attending: Oncology | Admitting: Oncology

## 2022-04-15 ENCOUNTER — Encounter: Payer: Self-pay | Admitting: Oncology

## 2022-04-15 VITALS — BP 131/74 | HR 68 | Temp 98.9°F | Resp 20 | Wt 173.9 lb

## 2022-04-15 DIAGNOSIS — Z9012 Acquired absence of left breast and nipple: Secondary | ICD-10-CM | POA: Insufficient documentation

## 2022-04-15 DIAGNOSIS — Z170 Estrogen receptor positive status [ER+]: Secondary | ICD-10-CM | POA: Insufficient documentation

## 2022-04-15 DIAGNOSIS — Z08 Encounter for follow-up examination after completed treatment for malignant neoplasm: Secondary | ICD-10-CM | POA: Diagnosis not present

## 2022-04-15 DIAGNOSIS — Z79899 Other long term (current) drug therapy: Secondary | ICD-10-CM | POA: Insufficient documentation

## 2022-04-15 DIAGNOSIS — M8580 Other specified disorders of bone density and structure, unspecified site: Secondary | ICD-10-CM | POA: Insufficient documentation

## 2022-04-15 DIAGNOSIS — Z853 Personal history of malignant neoplasm of breast: Secondary | ICD-10-CM | POA: Diagnosis not present

## 2022-04-15 DIAGNOSIS — C50812 Malignant neoplasm of overlapping sites of left female breast: Secondary | ICD-10-CM | POA: Diagnosis not present

## 2022-04-15 NOTE — Progress Notes (Signed)
Patient here for follow up she reports pain in left knee she is being follow by ortho. ?

## 2022-04-19 NOTE — Progress Notes (Signed)
? ? ? ?Hematology/Oncology Consult note ?[place]  ?[phone] [phone] ? ?Patient Care Team: ?Ria Bush, MD as PCP - General (Family Medicine) ?Rico Junker, RN as Registered Nurse ?Foltanski, Cleaster Corin, [facility] as Pharmacist (Pharmacist)  ? ?Name of the patient: Beth Rodgers  ?[id number]  ?06-24-1935  ? ?Date of visit: 04/19/22 ? ?Diagnosis- pathological prognostic stage Ia invasive mammary carcinoma of the left breast pT1 cpN0 cM0 ER/PR positive HER2 negative ? ?Chief complaint/ Reason for visit- routine f/u of breast cancer ? ?Heme/Onc history: Patient is a 87 year old female who self palpated a left breast mass in August 2021.  Diagnostic mammogram showed 2 adjacent masses at 4:00 and 6 o'clock position with interspersed calcifications in between spanning 4.5 cm.  Left axillary lymph nodes appeared normal.  Left breast biopsy in both 4 and 6 o'clock position showed invasive mammary carcinoma grade 1 ?  ?Patient was seen by Dr. Peyton Najjar and underwent left lumpectomy with sentinel lymph node biopsy.  Final pathology showed multifocal invasive mammary carcinoma with high-grade DCIS.  1 sentinel lymph node negative for malignancy.  DCIS also noted in the left retroareolar excisional biopsy.  Tumor size 19 mm grade 1.  Lymphovascular invasion negative.  Margins were negative for invasive carcinoma but positive for DCIS posterior superior.  ER/PR positive and HER2 equivocal by IHC and negative by [facility] group 5.  Given the concern for residual multifocal DCIS and positive margin for DCIS she was recommended to undergo mastectomy but chose to hold off on getting it done in September 2021.  She has now met with Dr. Peyton Najjar again and will be undergoing left mastectomy on 06/25/2021.  Oncotype DX testing was offered in September 2021 as well and patient declined.  Patient is not currently on endocrine therapy. ?  ?Patient underwentLeft mastectomy by Dr. Peyton Najjar on  06/25/2021 due to concern for residual multifocal DCIS.  Final pathology showed DCIS present at the base of the nipple measuring 0.5 cm.  An isolated focus of DCIS is less than 1.1 cm adjacent to the retroareolar excision site.  Surgical margins uninvolved by DCIS.  Patient did not wish to pursue endocrine therapy ?  ? ?Interval history-reports ongoing left knee pain which is a chronic issue and she is thinking whether she should go for knee replacement surgery or not ? ?ECOG PS- 1 ?Pain scale- 0 ? ?Review of systems- Review of Systems  ?Constitutional:  Negative for chills, fever, malaise/fatigue and weight loss.  ?HENT:  Negative for congestion, ear discharge and nosebleeds.   ?Eyes:  Negative for blurred vision.  ?Respiratory:  Negative for cough, hemoptysis, sputum production, shortness of breath and wheezing.   ?Cardiovascular:  Negative for chest pain, palpitations, orthopnea and claudication.  ?Gastrointestinal:  Negative for abdominal pain, blood in stool, constipation, diarrhea, heartburn, melena, nausea and vomiting.  ?Genitourinary:  Negative for dysuria, flank pain, frequency, hematuria and urgency.  ?Musculoskeletal:  Positive for joint pain. Negative for back pain and myalgias.  ?Skin:  Negative for rash.  ?Neurological:  Negative for dizziness, tingling, focal weakness, seizures, weakness and headaches.  ?Endo/Heme/Allergies:  Does not bruise/bleed easily.  ?Psychiatric/Behavioral:  Negative for depression and suicidal ideas. The patient does not have insomnia.    ? ?Allergies  ?Allergen Reactions  ? Anastrozole Other (See Comments), Itching and Rash  ?  Back pain  ?Back pain  ?Back pain   ? Exemestane Rash  ?  Pain in right hand  ?Pain in right hand  ?Pain  in right hand   ? Naproxen Hives  ?  Had bumps break out on the back  ?Had bumps break out on the back  ?Had bumps break out on the back  ?Had bumps break out on the back  ?Had bumps break out on the back   ? ? ? ?Past Medical History:  ?Diagnosis  Date  ? Basal cell carcinoma (BCC)   ? txted with MOHs in past  ? Cellulitis 08/10/2018  ? Chronic venous insufficiency   ? with varicose veins  ? Family history of breast cancer   ? Family history of thyroid cancer   ? GERD (gastroesophageal reflux disease)   ? Glaucoma   ? History  of basal cell carcinoma   ? left nare/BREAST CANCER  ? History of chicken pox   ? Hypertension   ? Hypoglycemia   ? Hypothyroid   ? Osteoarthritis   ? back pain, L knee pain  ? Osteopenia 06/2009  ? DEXA 11/2015: T -2.3 hip, -2.2 spine, on longterm bisphosphonate/evista  ? Psoriasis   ? Pyogenic granuloma 04/16/2016  ? ? ? ?Past Surgical History:  ?Procedure Laterality Date  ? BASAL CELL CARCINOMA EXCISION    ? located on face  ? BREAST BIOPSY Right   ? core done ing [facility] [place]?  ? BREAST BIOPSY Left 08/16/2020  ? 3 area bx 4:00 X, IMC, 6:00Q IMC, LN hydro #3-benign  ? BREAST LUMPECTOMY Left 09/11/2020  ? two areas of [facility]  ? CATARACT EXTRACTION    ? bilateral  ? DEXA  06/2009  ? T score Spine: -1.8, hip -2.0  ? EXCISION OF BREAST BIOPSY Left 09/11/2020  ? Procedure: EXCISION OF BREAST BIOPSY;  Surgeon: [name], MD;  Location: [facility] ORS;  Service: General;  Laterality: Left;  ? Foam sclerotherapy Bilateral 09/2015  ? [place], [place]  ? MASTECTOMY W/ SENTINEL NODE BIOPSY Left 06/25/2021  ? Procedure: MASTECTOMY WITH SENTINEL LYMPH NODE BIOPSY;  Surgeon: [name], MD;  Location: [facility] ORS;  Service: General;  Laterality: Left;  ? NASAL RECONSTRUCTION  2005  ? for [place] L nare s/p Mohs  ? nuclear stress test  2014  ? normal in [place]  ? PARTIAL MASTECTOMY WITH NEEDLE LOCALIZATION AND AXILLARY SENTINEL LYMPH NODE BX Left 09/11/2020  ? Procedure: PARTIAL MASTECTOMY WITH NEEDLE LOCALIZATION AND AXILLARY SENTINEL LYMPH NODE BX;  Surgeon: [name], MD;  Location: [facility] ORS;  Service: General;  Laterality: Left;  ? RADIOFREQUENCY ABLATION Left 01/2012  ? remnant L G saphenous vein below knee  ?  RADIOFREQUENCY ABLATION Right 02/2013  ? RLE vein ablation   ? VEIN LIGATION AND STRIPPING  remote  ? bilateral G saphenous veins  ? ? ?Social History  ? ?Socioeconomic History  ? Marital status: Married  ?  Spouse name: Not on file  ? Number of children: 2  ? Years of education: Not on file  ? Highest education level: Not on file  ?Occupational History  ?  Employer: RETIRED  ?Tobacco Use  ? Smoking status: Never  ? Smokeless tobacco: Never  ?Vaping Use  ? Vaping Use: Never used  ?Substance and Sexual Activity  ? Alcohol use: Yes  ?  Comment: Occasionally drinks wine  ? Drug use: No  ? Sexual activity: Not Currently  ?  Birth control/protection: Post-menopausal  ?Other Topics Concern  ? Not on file  ?Social History Narrative  ? From [place]  ? Caffeine: 2-3 cups/day  ? Lives with husband, 46 of  time alone as he travels to [place].  ? Has grandchildren nearby.  ? Occ: Field seismologist, Metallurgist  ? Activity: bed exercises, limiting walking  ? Diet: does get fruits and vegetables, only some water  ? ?Social Determinants of Health  ? ?Financial Resource Strain: Low Risk   ? Difficulty of Paying Living Expenses: Not hard at all  ?Food Insecurity: No Food Insecurity  ? Worried About Charity fundraiser in the Last Year: Never true  ? Ran Out of Food in the Last Year: Never true  ?Transportation Needs: No Transportation Needs  ? Lack of Transportation (Medical): No  ? Lack of Transportation (Non-Medical): No  ?Physical Activity: Sufficiently Active  ? Days of Exercise per Week: 7 days  ? Minutes of Exercise per Session: 30 min  ?Stress: No Stress Concern Present  ? Feeling of Stress : Not at all  ?Social Connections: Not on file  ?Intimate Partner Violence: Not At Risk  ? Fear of Current or Ex-Partner: No  ? Emotionally Abused: No  ? Physically Abused: No  ? Sexually Abused: No  ? ? ?Family History  ?Problem Relation Age of Onset  ? Cancer Mother   ?     thyroid cancer  ? Heart disease Father   ?      CHF  ? Diabetes Father   ? Glaucoma Father   ? Arthritis Father   ? Coronary artery disease Maternal Uncle   ? Bipolar disorder Son   ? Heart disease Sister   ?     CHF  ? Stroke Sister   ? Brain cancer Grandson   ?

## 2022-04-21 DIAGNOSIS — M1712 Unilateral primary osteoarthritis, left knee: Secondary | ICD-10-CM | POA: Diagnosis not present

## 2022-04-22 ENCOUNTER — Ambulatory Visit (INDEPENDENT_AMBULATORY_CARE_PROVIDER_SITE_OTHER): Payer: Medicare Other | Admitting: Family Medicine

## 2022-04-22 ENCOUNTER — Encounter: Payer: Self-pay | Admitting: Family Medicine

## 2022-04-22 VITALS — BP 138/74 | HR 77 | Temp 98.4°F | Ht 66.0 in | Wt 172.5 lb

## 2022-04-22 DIAGNOSIS — M1712 Unilateral primary osteoarthritis, left knee: Secondary | ICD-10-CM | POA: Diagnosis not present

## 2022-04-22 NOTE — Progress Notes (Signed)
? ? ?Dennie Vecchio T. Jadda Hunsucker, MD, [facility] ?Therapist, music at [facility] ?[place] ?[place], [address] ? ?Phone: [phone]  FAX: [phone] ? ?Beth Rodgers - 87 y.o. female  MRN [id number]  Date of Birth: Oct 06, 1935 ? ?Date: 04/22/2022  PCP: Ria Bush, MD  Referral: Ria Bush, MD ? ?Chief Complaint  ?Patient presents with  ? Knee Pain  ?  Left   ? ? ?This visit occurred during the SARS-CoV-2 public health emergency.  Safety protocols were in place, including screening questions prior to the visit, additional usage of staff PPE, and extensive cleaning of exam room while observing appropriate contact time as indicated for disinfecting solutions.  ? ?Subjective:  ? ?Beth Rodgers is a 87 y.o. very pleasant female patient with Body mass index is 27.84 kg/m?. who presents with the following: ? ?L Chronic knee pain: See my discussion below. ? ?She is a very nice young lady who wanted to talk to me about knee pain. ? ?She has seen Dr. [name] - yesterday? ?It looks as if she had a TKR scheduled with Dr. Ricki Rodriguez, but this was cancelled. ?She really did like Dr. [name] yesterday from an expertise standpoint, and he also has excellent bedside manner. ? ?2 wounds req wound care and vascular surgery.  These have healed. ? ?Thinking about joint replacement. ?Has been going to PT. ?Feels a lot stronger.  ? ?Has seen Dr. [name] and Dr. Ricki Rodriguez. ? ? ? ?Review of Systems is noted in the HPI, as appropriate ? ?Objective:  ? ?BP 138/74   Pulse 77   Temp 98.4 ?F (36.9 ?C) (Oral)   Ht '5\' 6"'$  (1.676 m)   Wt 172 lb 8 oz (78.2 kg)   SpO2 97%   BMI 27.84 kg/m?  ? ?GEN: No acute distress; alert,appropriate. ?PULM: Breathing comfortably in no respiratory distress ?PSYCH: Normally interactive.  ? ?Fairly dramatic joint line tenderness medially and laterally on the left.  Additional manipulation was not done.  Pain with extension and flexion. ? ?Laboratory and Imaging Data: ? ?Assessment  and Plan:  ? ?  ICD-10-CM   ?1. Primary osteoarthritis of left knee  M17.12   ?  ? ?Total encounter time: 20 minutes. This includes total time spent on the day of encounter.   ?I have known the patient for years now, and thank you she is dramatically limited by osteoarthritis of her knee.  She has had family members that have lived well into their 77s. ? ?Talk with her before also, and I think that she would likely do well after knee replacement have some fairly dramatic relief of knee pain afterwards. ? ?She has talked with both Dr. [name] and Dr. Ricki Rodriguez.  She really liked Dr. Clydell Hakim bedside manner, and I told her in my opinion he has as good a bedside manner is any physician that I have never known.  I do not think that there is anyone technically better than either Dr. [name] or Dr. Ricki Rodriguez.  Patient lives in [place].  She is in her 90s, and her husband is in her upper 70s. ? ?She is a little bit nervous.  I told her that if I were in her position, I would be exceptionally confident and would move forward with a joint replacement by Dr. [name] if she wanted to have surgery.  She does, and she is going to contact his office. ? ?No orders of the defined types were placed in this encounter. ? ?There are  no discontinued medications. ?No orders of the defined types were placed in this encounter. ? ? ?Follow-up: No follow-ups on file. ? ?Dragon [employer] One speech-to-text software was used for transcription in this dictation.  Possible transcriptional errors can occur using Editor, commissioning.  ? ?Signed, ? ?Kawana Hegel T. Yancey Pedley, MD ? ? ?Outpatient Encounter Medications as of 04/22/2022  ?Medication Sig  ? alendronate (FOSAMAX) 70 MG tablet Take 1 tablet (70 mg total) by mouth every Sunday.  ? Calcium Carb-Cholecalciferol (CALCIUM 600 + D PO) Take 1 tablet by mouth daily. Ca 500 + Vit D 800  ? calcium carbonate (TUMS EX) 750 MG chewable tablet Chew 750 mg by mouth daily as needed for heartburn.  ? celecoxib (CELEBREX) 200  MG capsule Take 200 mg by mouth daily.  ? Ciclopirox 0.77 % gel Apply 1 application topically 2 (two) times daily as needed (fungus).  ? clobetasol cream (TEMOVATE) 3.29 % Apply 1 application topically as directed. Qd to bid up to 5 days a week to aa psoriasis on body until clear, then prn flares, avoid face, groin, axilla (Patient taking differently: Apply 1 application. topically 2 (two) times daily.)  ? diclofenac sodium (VOLTAREN) 1 % GEL Apply 1 application topically 3 (three) times daily. (Patient taking differently: Apply 1 application. topically 3 (three) times daily as needed (pain).)  ? latanoprost (XALATAN) 0.005 % ophthalmic solution latanoprost 0.005 % eye drops ? PLACE 1 DROP INTO BOTH EYES ONCE EVERY EVENING  ? levothyroxine (SYNTHROID) 75 MCG tablet Take 1 tablet (75 mcg total) by mouth daily.  ? losartan (COZAAR) 100 MG tablet TAKE 1 TABLET BY MOUTH EVERY DAY  ? Magnesium 400 MG TABS Take 400 mg by mouth daily.  ? Multiple Vitamin (MULTIVITAMIN) tablet Take 1 tablet by mouth daily.  ? Roflumilast (ZORYVE) 0.3 % CREA Apply topically every other day. Sample  ? SELENIUM PO Take by mouth daily.  ? silver sulfADIAZINE (SILVADENE) 1 % cream Apply 1 application topically daily.  ? tacrolimus (PROTOPIC) 0.1 % ointment APPLY TOPICALLY TO AFFECTED AREA(S) TWICE DAILY  ? vitamin C (ASCORBIC ACID) 500 MG tablet Take 500 mg by mouth daily.  ? vitamin E 180 MG (400 UNITS) capsule Take 400 Units by mouth daily.  ? Horse Chestnut (VENASTAT PO) Take by mouth. (Patient not taking: Reported on 04/22/2022)  ? ?No facility-administered encounter medications on file as of 04/22/2022.  ?  ?

## 2022-04-26 DIAGNOSIS — Z20822 Contact with and (suspected) exposure to covid-19: Secondary | ICD-10-CM | POA: Diagnosis not present

## 2022-04-28 ENCOUNTER — Ambulatory Visit (INDEPENDENT_AMBULATORY_CARE_PROVIDER_SITE_OTHER): Payer: Medicare Other | Admitting: Psychology

## 2022-04-28 DIAGNOSIS — F4323 Adjustment disorder with mixed anxiety and depressed mood: Secondary | ICD-10-CM

## 2022-04-28 NOTE — Progress Notes (Signed)
[facility] Counselor/Therapist Progress Note ? ?Patient ID: Beth Rodgers, MRN: [id number]   ? ?Date: 04/28/22 ? ?Time Spent: 1:02 pm - 1:51 pm : 49 Minutes ? ?Treatment Type: Individual Therapy. ? ?Reported Symptoms: Rumination, anxiety.   ? ?Mental Status Exam: ?Appearance:  NA     ?Behavior: Appropriate  ?Motor: NA  ?Speech/Language:  Clear and Coherent and Normal Rate  ?Affect: Congruent  ?Mood: dysthymic  ?Thought process: normal  ?Thought content:   WNL  ?Sensory/Perceptual disturbances:   WNL  ?Orientation: oriented to person, place, time/date, and situation  ?Attention: Good  ?Concentration: Good  ?Memory: WNL  ?Fund of knowledge:  Good  ?Insight:   Good  ?Judgment:  Good  ?Impulse Control: Good  ? ?Risk Assessment: ?Danger to Self:  No ?Self-injurious Behavior: No  ?Danger to Others: No ?Duty to Warn:no ?Physical Aggression / Violence:No  ?Access to Firearms a concern: No  ?Gang Involvement:No  ? ?Subjective:  ? ?Beth Rodgers participated from home, via phone, and consented to treatment. Therapist participated from office. We met online due to [place] pandemic. Beth Rodgers reviewed the events of the past two weeks. She noted her son's recent visit and appearing to be in a depressive episode. She noted an upcoming knee operation and she noted looking forward to this so that she can visit her home country after her home country. She noted her worry regarding completing tasks prior to her surgery and noted her attempts to communicate needs to husband, [name]. We reviewed ways to communicate needs and resolve conflict. Therapist praised [name]'s efforts to manage her mood and set boundaries regarding her worries. Therapist validated Beth Rodgers's feelings and provided supportive therapy. ?  ?Interventions: Interpersonal ? ?Diagnosis: Adjustment disorder with mixed anxiety and depressed mood ? ?Plan: Patient is to use CBT, BA, Problem-solving, mindfulness and coping skills to help manage decrease symptoms associated  with their diagnosis. (Target date: 08/16/22) ?  ?Long-term goal:   ?Reduce overall level, frequency, and intensity of the feelings of depression and anxiety evidenced by   decreased sadness, rumination, lethargy, and interpersonal stressors  from 6 to 7 days/week to 0 to 1 days/week per client report for at least 3 consecutive months. ? ?Short-term goal:  ?Verbally express understanding of the relationship between feelings of depression, anxiety and their impact on thinking patterns and behaviors. ? ?Verbalize an understanding of the role that distorted thinking plays in creating fears, excessive worry, and ruminations. ? ?Engage in enjoyable activities on a consistent basis. ? ?[name], LCSW ?

## 2022-04-30 DIAGNOSIS — M170 Bilateral primary osteoarthritis of knee: Secondary | ICD-10-CM | POA: Diagnosis not present

## 2022-05-05 DIAGNOSIS — Z20822 Contact with and (suspected) exposure to covid-19: Secondary | ICD-10-CM | POA: Diagnosis not present

## 2022-05-07 ENCOUNTER — Encounter (INDEPENDENT_AMBULATORY_CARE_PROVIDER_SITE_OTHER): Payer: Self-pay | Admitting: Vascular Surgery

## 2022-05-07 ENCOUNTER — Ambulatory Visit (INDEPENDENT_AMBULATORY_CARE_PROVIDER_SITE_OTHER): Payer: Medicare Other | Admitting: Nurse Practitioner

## 2022-05-07 ENCOUNTER — Ambulatory Visit (INDEPENDENT_AMBULATORY_CARE_PROVIDER_SITE_OTHER): Payer: Medicare Other

## 2022-05-07 VITALS — BP 155/83 | HR 71 | Resp 16 | Wt 171.0 lb

## 2022-05-07 DIAGNOSIS — M159 Polyosteoarthritis, unspecified: Secondary | ICD-10-CM

## 2022-05-07 DIAGNOSIS — I890 Lymphedema, not elsewhere classified: Secondary | ICD-10-CM

## 2022-05-07 DIAGNOSIS — I872 Venous insufficiency (chronic) (peripheral): Secondary | ICD-10-CM | POA: Diagnosis not present

## 2022-05-07 DIAGNOSIS — I10 Essential (primary) hypertension: Secondary | ICD-10-CM | POA: Diagnosis not present

## 2022-05-10 ENCOUNTER — Encounter (INDEPENDENT_AMBULATORY_CARE_PROVIDER_SITE_OTHER): Payer: Self-pay | Admitting: Nurse Practitioner

## 2022-05-10 NOTE — Progress Notes (Signed)
? ?Subjective:  ? ? Patient ID: Beth Rodgers, female    DOB: 1935/12/10, 87 y.o.   MRN: [id number] ?Chief Complaint  ?Patient presents with  ? Follow-up  ?  Ultrasound follow up  ? ? ?Beth Rodgers is an 87 year old female who returns today for follow-up evaluation of her lower extremity lymphedema.  The patient notes that she has been very diligent in her care of her lower extremities.  She notes that she wears her medical grade compression stockings every day as well as is diligent about elevating her lower extremities when possible.  She is her lymphedema pump on a regular basis and is sure to moisturize her lower extremities.  She has upcoming knee surgery and wanted to be sure there is no worsening evidence of reflux.  Today noninvasive study showed no DVT or superficial or deep venous insufficiency bilaterally.  Overall she is doing well. ? ? ?Review of Systems  ?Cardiovascular:  Positive for leg swelling.  ?All other systems reviewed and are negative. ? ?   ?Objective:  ? Physical Exam ?Vitals reviewed.  ?HENT:  ?   Head: Normocephalic.  ?Cardiovascular:  ?   Rate and Rhythm: Normal rate.  ?   Pulses: Normal pulses.  ?Pulmonary:  ?   Effort: Pulmonary effort is normal.  ?Musculoskeletal:  ?   Right lower leg: 1+ Edema present.  ?   Left lower leg: 1+ Edema present.  ?Skin: ?   General: Skin is warm and dry.  ?Neurological:  ?   Mental Status: She is alert and oriented to person, place, and time.  ?Psychiatric:     ?   Mood and Affect: Mood normal.     ?   Behavior: Behavior normal.     ?   Thought Content: Thought content normal.     ?   Judgment: Judgment normal.  ? ? ?BP (!) 155/83 (BP Location: Right Arm)   Pulse 71   Resp 16   Wt 171 lb (77.6 kg)   BMI 27.60 kg/m?  ? ?Past Medical History:  ?Diagnosis Date  ? Basal cell carcinoma (BCC)   ? txted with MOHs in past  ? Cellulitis 08/10/2018  ? Chronic venous insufficiency   ? with varicose veins  ? Family history of breast cancer   ? Family history of  thyroid cancer   ? GERD (gastroesophageal reflux disease)   ? Glaucoma   ? History  of basal cell carcinoma   ? left nare/BREAST CANCER  ? History of chicken pox   ? Hypertension   ? Hypoglycemia   ? Hypothyroid   ? Osteoarthritis   ? back pain, L knee pain  ? Osteopenia 06/2009  ? DEXA 11/2015: T -2.3 hip, -2.2 spine, on longterm bisphosphonate/evista  ? Psoriasis   ? Pyogenic granuloma 04/16/2016  ? ? ?Social History  ? ?Socioeconomic History  ? Marital status: Married  ?  Spouse name: Not on file  ? Number of children: 2  ? Years of education: Not on file  ? Highest education level: Not on file  ?Occupational History  ?  Employer: RETIRED  ?Tobacco Use  ? Smoking status: Never  ? Smokeless tobacco: Never  ?Vaping Use  ? Vaping Use: Never used  ?Substance and Sexual Activity  ? Alcohol use: Yes  ?  Comment: Occasionally drinks wine  ? Drug use: No  ? Sexual activity: Not Currently  ?  Birth control/protection: Post-menopausal  ?Other Topics Concern  ? Not on  file  ?Social History Narrative  ? From [place], [place]  ? Caffeine: 2-3 cups/day  ? Lives with husband, majority of time alone as he travels to [place].  ? Has grandchildren nearby.  ? Occ: Field seismologist, Metallurgist  ? Activity: bed exercises, limiting walking  ? Diet: does get fruits and vegetables, only some water  ? ?Social Determinants of Health  ? ?Financial Resource Strain: Low Risk   ? Difficulty of Paying Living Expenses: Not hard at all  ?Food Insecurity: No Food Insecurity  ? Worried About Charity fundraiser in the Last Year: Never true  ? Ran Out of Food in the Last Year: Never true  ?Transportation Needs: No Transportation Needs  ? Lack of Transportation (Medical): No  ? Lack of Transportation (Non-Medical): No  ?Physical Activity: Sufficiently Active  ? Days of Exercise per Week: 7 days  ? Minutes of Exercise per Session: 30 min  ?Stress: No Stress Concern Present  ? Feeling of Stress : Not at all  ?Social Connections: Not on file   ?Intimate Partner Violence: Not At Risk  ? Fear of Current or Ex-Partner: No  ? Emotionally Abused: No  ? Physically Abused: No  ? Sexually Abused: No  ? ? ?Past Surgical History:  ?Procedure Laterality Date  ? BASAL CELL CARCINOMA EXCISION    ? located on face  ? BREAST BIOPSY Right   ? core done ing [place]?  ? BREAST BIOPSY Left 08/16/2020  ? 3 area bx 4:00 X, IMC, 6:00Q IMC, LN hydro #3-benign  ? BREAST LUMPECTOMY Left 09/11/2020  ? two areas of [facility]  ? CATARACT EXTRACTION    ? bilateral  ? DEXA  06/2009  ? T score Spine: -1.8, hip -2.0  ? EXCISION OF BREAST BIOPSY Left 09/11/2020  ? Procedure: EXCISION OF BREAST BIOPSY;  Surgeon: [name], MD;  Location: [facility] ORS;  Service: General;  Laterality: Left;  ? Foam sclerotherapy Bilateral 09/2015  ? [place], [place]  ? MASTECTOMY W/ SENTINEL NODE BIOPSY Left 06/25/2021  ? Procedure: MASTECTOMY WITH SENTINEL LYMPH NODE BIOPSY;  Surgeon: [name], MD;  Location: [facility];  Service: General;  Laterality: Left;  ? NASAL RECONSTRUCTION  2005  ? for [employer] L nare s/p Mohs  ? nuclear stress test  2014  ? normal in [place]  ? PARTIAL MASTECTOMY WITH NEEDLE LOCALIZATION AND AXILLARY SENTINEL LYMPH NODE BX Left 09/11/2020  ? Procedure: PARTIAL MASTECTOMY WITH NEEDLE LOCALIZATION AND AXILLARY SENTINEL LYMPH NODE BX;  Surgeon: [name], MD;  Location: [facility] OR[facility];  Service: General;  Laterality: Left;  ? RADIOFREQUENCY ABLATION Left 01/2012  ? remnant L G saphenous vein below knee  ? RADIOFREQUENCY ABLATION Right 02/2013  ? RLE vein ablation   ? VEIN LIGATION AND STRIPPING  remote  ? bilateral G saphenous veins  ? ? ?Family History  ?Problem Relation Age of Onset  ? Cancer Mother   ?     thyroid cancer  ? Heart disease Father   ?     CHF  ? Diabetes Father   ? Glaucoma Father   ? Arthritis Father   ? Coronary artery disease Maternal Uncle   ? Bipolar disorder Son   ? Heart disease Sister   ?     CHF  ? Stroke Sister   ? Brain cancer  Grandson   ?     astrocytoma  ? Breast cancer Cousin   ?     dx 70s  ? ? ?Allergies  ?  Allergen Reactions  ? Anastrozole Other (See Comments), Itching and Rash  ?  Back pain  ?Back pain  ?Back pain   ? Exemestane Rash  ?  Pain in right hand  ?Pain in right hand  ?Pain in right hand   ? Naproxen Hives  ?  Had bumps break out on the back  ?Had bumps break out on the back  ?Had bumps break out on the back  ?Had bumps break out on the back  ?Had bumps break out on the back   ? ? ? ?  Latest Ref Rng & Units 06/02/2021  ? 12:27 PM 01/04/2021  ?  8:14 AM 08/21/2020  ?  4:25 PM  ?CBC  ?WBC 4.0 - 10.5 K/uL 6.3   7.7   7.4    ?Hemoglobin 12.0 - 15.0 g/dL 13.9   13.4   13.7    ?Hematocrit 36.0 - 46.0 % 40.9   41.2   40.6    ?Platelets 150.0 - 400.0 K/uL 230.0   226   221    ? ? ? ? ?CMP  ?   ?Component Value Date/Time  ? NA 139 06/02/2021 1227  ? K 4.3 06/02/2021 1227  ? CL 102 06/02/2021 1227  ? CO2 27 06/02/2021 1227  ? GLUCOSE 82 06/02/2021 1227  ? BUN 19 06/02/2021 1227  ? CREATININE 0.60 02/22/2022 1559  ? CALCIUM 9.8 06/02/2021 1227  ? PROT 7.4 08/21/2020 1625  ? ALBUMIN 4.3 08/21/2020 1625  ? AST 19 08/21/2020 1625  ? ALT 16 08/21/2020 1625  ? ALKPHOS 82 08/21/2020 1625  ? BILITOT 0.4 08/21/2020 1625  ? GFRNONAA >60 01/04/2021 0814  ? GFRAA >60 08/21/2020 1625  ? ? ? ?No results found. ? ?   ?Assessment & Plan:  ? ?1. Lymphedema ?The patient has done very well in regards to her lymphedema.  Today the swelling is under good control.  There are no open wounds or ulcerations.  There is no signs symptoms of cellulitis.  The patient does have an upcoming knee surgery.  At this time the patient's lymphedema is under good control and it is reasonable for her to undergo knee replacement surgery.  We will plan on having the patient follow-up following her knee replacement surgery as this is likely to cause some lower extremity swelling.  Patient is advised to continue with conservative therapy ? ?2. Essential hypertension ?Continue  antihypertensive medications as already ordered, these medications have been reviewed and there are no changes at this time.  ? ?3. Primary osteoarthritis involving multiple joints ?Continue NSAID medications as already o

## 2022-05-12 ENCOUNTER — Ambulatory Visit (INDEPENDENT_AMBULATORY_CARE_PROVIDER_SITE_OTHER): Payer: Medicare Other | Admitting: Psychology

## 2022-05-12 DIAGNOSIS — F4323 Adjustment disorder with mixed anxiety and depressed mood: Secondary | ICD-10-CM

## 2022-05-12 NOTE — Progress Notes (Signed)
[facility] Counselor/Therapist Progress Note ? ?Patient ID: Beth Rodgers, MRN: [id number]   ? ?Date: 05/12/22 ? ?Time Spent: 1:03 pm - 1:53 pm : 50 Minutes ? ?Treatment Type: Individual Therapy. ? ?Reported Symptoms: Rumination, anxiety.   ? ?Mental Status Exam: ?Appearance:  NA     ?Behavior: Appropriate  ?Motor: NA  ?Speech/Language:  Clear and Coherent and Normal Rate  ?Affect: Congruent  ?Mood: dysthymic  ?Thought process: normal  ?Thought content:   WNL  ?Sensory/Perceptual disturbances:   WNL  ?Orientation: oriented to person, place, time/date, and situation  ?Attention: Good  ?Concentration: Good  ?Memory: WNL  ?Fund of knowledge:  Good  ?Insight:   Good  ?Judgment:  Good  ?Impulse Control: Good  ? ?Risk Assessment: ?Danger to Self:  No ?Self-injurious Behavior: No  ?Danger to Others: No ?Duty to Warn:no ?Physical Aggression / Violence:No  ?Access to Firearms a concern: No  ?Gang Involvement:No  ? ?Subjective:  ? ?[name] participated from home, via phone, and consented to treatment. Therapist participated from office. We met online due to [place] pandemic. Majesti reviewed the events of the past two weeks. Ladesha noted her recent interaction with her son and her worry regarding his appearance and demeanor. She noted continued erratic behavior from her son and varying opinions regarding how to intercede. She noted that she and her husband had varying perspectives on how to deal with [name]. She discussed her worry that her son is abusing substances. We explored this and her boundaries regarding his behavior going forward. Therapist encouraged communication and introspection regarding this, going forward. Therapist praised Moana for her efforts to address these issues proactively with her husband. She discussed a need to identify resources for how to communicate with a family member who has bipolar. Resources were provided. Therapist validated Bracha's feelings and provided supportive therapy. ?   ?Interventions: Interpersonal ? ?Diagnosis: Adjustment disorder with mixed anxiety and depressed mood ? ?Plan: Patient is to use CBT, BA, Problem-solving, mindfulness and coping skills to help manage decrease symptoms associated with their diagnosis. (Target date: 08/16/22) ?  ?Long-term goal:   ?Reduce overall level, frequency, and intensity of the feelings of depression and anxiety evidenced by   decreased sadness, rumination, lethargy, and interpersonal stressors  from 6 to 7 days/week to 0 to 1 days/week per client report for at least 3 consecutive months. ? ?Short-term goal:  ?Verbally express understanding of the relationship between feelings of depression, anxiety and their impact on thinking patterns and behaviors. ? ?Verbalize an understanding of the role that distorted thinking plays in creating fears, excessive worry, and ruminations. ? ?Engage in enjoyable activities on a consistent basis. ? ?[name], LCSW ?

## 2022-05-15 ENCOUNTER — Encounter
Admission: RE | Admit: 2022-05-15 | Discharge: 2022-05-15 | Disposition: A | Discharge status: Home / Self Care | Payer: Medicare Other | Source: Ambulatory Visit | Attending: Orthopedic Surgery | Admitting: Orthopedic Surgery

## 2022-05-15 VITALS — BP 141/90 | HR 68 | Resp 16 | Ht 66.0 in | Wt 172.0 lb

## 2022-05-15 DIAGNOSIS — M1712 Unilateral primary osteoarthritis, left knee: Secondary | ICD-10-CM

## 2022-05-15 DIAGNOSIS — R079 Chest pain, unspecified: Secondary | ICD-10-CM

## 2022-05-15 DIAGNOSIS — Z170 Estrogen receptor positive status [ER+]: Secondary | ICD-10-CM | POA: Diagnosis not present

## 2022-05-15 DIAGNOSIS — R600 Localized edema: Secondary | ICD-10-CM

## 2022-05-15 DIAGNOSIS — R0789 Other chest pain: Secondary | ICD-10-CM

## 2022-05-15 DIAGNOSIS — Z01812 Encounter for preprocedural laboratory examination: Secondary | ICD-10-CM | POA: Diagnosis not present

## 2022-05-15 DIAGNOSIS — M85852 Other specified disorders of bone density and structure, left thigh: Secondary | ICD-10-CM

## 2022-05-15 DIAGNOSIS — Z01818 Encounter for other preprocedural examination: Secondary | ICD-10-CM

## 2022-05-15 DIAGNOSIS — R1312 Dysphagia, oropharyngeal phase: Secondary | ICD-10-CM | POA: Diagnosis not present

## 2022-05-15 DIAGNOSIS — C50812 Malignant neoplasm of overlapping sites of left female breast: Secondary | ICD-10-CM

## 2022-05-15 DIAGNOSIS — I83003 Varicose veins of unspecified lower extremity with ulcer of ankle: Secondary | ICD-10-CM | POA: Diagnosis not present

## 2022-05-15 DIAGNOSIS — R5383 Other fatigue: Secondary | ICD-10-CM

## 2022-05-15 DIAGNOSIS — I890 Lymphedema, not elsewhere classified: Secondary | ICD-10-CM

## 2022-05-15 DIAGNOSIS — E039 Hypothyroidism, unspecified: Secondary | ICD-10-CM | POA: Diagnosis not present

## 2022-05-15 DIAGNOSIS — R0989 Other specified symptoms and signs involving the circulatory and respiratory systems: Secondary | ICD-10-CM

## 2022-05-15 DIAGNOSIS — L97309 Non-pressure chronic ulcer of unspecified ankle with unspecified severity: Secondary | ICD-10-CM | POA: Diagnosis not present

## 2022-05-15 DIAGNOSIS — R42 Dizziness and giddiness: Secondary | ICD-10-CM

## 2022-05-15 HISTORY — DX: Lymphedema, not elsewhere classified: I89.0

## 2022-05-15 LAB — URINALYSIS, ROUTINE W REFLEX MICROSCOPIC
Bilirubin Urine: NEGATIVE
Glucose, UA: NEGATIVE mg/dL
Hgb urine dipstick: NEGATIVE
Ketones, ur: NEGATIVE mg/dL
Leukocytes,Ua: NEGATIVE
Nitrite: NEGATIVE
Protein, ur: NEGATIVE mg/dL
Specific Gravity, Urine: 1.009 (ref 1.005–1.030)
pH: 6 (ref 5.0–8.0)

## 2022-05-15 LAB — COMPREHENSIVE METABOLIC PANEL
ALT: 16 U/L (ref 0–44)
AST: 19 U/L (ref 15–41)
Albumin: 4 g/dL (ref 3.5–5.0)
Alkaline Phosphatase: 81 U/L (ref 38–126)
Anion gap: 5 (ref 5–15)
BUN: 22 mg/dL (ref 8–23)
CO2: 27 mmol/L (ref 22–32)
Calcium: 8.8 mg/dL — ABNORMAL LOW (ref 8.9–10.3)
Chloride: 104 mmol/L (ref 98–111)
Creatinine, Ser: 0.59 mg/dL (ref 0.44–1.00)
GFR, Estimated: >60 mL/min (ref >60)
Glucose, Bld: 91 mg/dL (ref 70–99)
Potassium: 4.2 mmol/L (ref 3.5–5.1)
Sodium: 136 mmol/L (ref 135–145)
Total Bilirubin: 0.5 mg/dL (ref 0.3–1.2)
Total Protein: 7.2 g/dL (ref 6.5–8.1)

## 2022-05-15 LAB — CBC
HCT: 42.4 % (ref 36.0–46.0)
Hemoglobin: 13.6 g/dL (ref 12.0–15.0)
MCH: 28 pg (ref 26.0–34.0)
MCHC: 32.1 g/dL (ref 30.0–36.0)
MCV: 87.2 fL (ref 80.0–100.0)
Platelets: 226 10*3/uL (ref 150–400)
RBC: 4.86 MIL/uL (ref 3.87–5.11)
RDW: 14.2 % (ref 11.5–15.5)
WBC: 5.5 10*3/uL (ref 4.0–10.5)
nRBC: 0 % (ref 0.0–0.2)

## 2022-05-15 LAB — SEDIMENTATION RATE: Sed Rate: 18 mm/hr (ref 0–30)

## 2022-05-15 LAB — TYPE AND SCREEN
ABO/RH(D): O POS
Antibody Screen: NEGATIVE

## 2022-05-15 LAB — C-REACTIVE PROTEIN: CRP: 0.6 mg/dL (ref <1.0)

## 2022-05-15 LAB — SURGICAL PCR SCREEN
MRSA, PCR: NEGATIVE
Staphylococcus aureus: NEGATIVE

## 2022-05-15 NOTE — Discharge Instructions (Addendum)
Instructions after Total Knee Replacement   James P. Holley Bouche., M.D.     Dept. of [facility]  [address] [place], [place]  [address]  Phone: [phone]   Fax: [phone]    DIET: Drink plenty of non-alcoholic fluids. Resume your normal diet. Include foods high in fiber.  ACTIVITY:  You may use crutches or a walker with weight-bearing as tolerated, unless instructed otherwise. You may be weaned off of the walker or crutches by your Physical Therapist.  Do NOT place pillows under the knee. Anything placed under the knee could limit your ability to straighten the knee.   Continue doing gentle exercises. Exercising will reduce the pain and swelling, increase motion, and prevent muscle weakness.   Please continue to use the TED compression stockings for 6 weeks. You may remove the stockings at night, but should reapply them in the morning. Do not drive or operate any equipment until instructed.  WOUND CARE:  Continue to use the PolarCare or ice packs periodically to reduce pain and swelling. You may bathe or shower after the staples are removed at the first office visit following surgery. Change honeycomb dressing every 4-5 days as needed. Remove gauze compression dressing when changing honeycomb for the first time.   MEDICATIONS: You may resume your regular medications. Please take the pain medication as prescribed on the medication. Do not take pain medication on an empty stomach. An increase in pain is expected around post-operative day #2 as local anesthetic medication lasts approximately 48 hours after surgery. You have been given a prescription for a blood thinner (Lovenox or Coumadin). Please take the medication as instructed. (NOTE: After completing a 2 week course of Lovenox, take one Enteric-coated aspirin once a day. This along with elevation will help reduce the possibility of phlebitis in your operated leg.) Do not drive or  drink alcoholic beverages when taking pain medications.  CALL THE OFFICE FOR: Temperature above 101 degrees Excessive bleeding or drainage on the dressing. Excessive swelling, coldness, or paleness of the toes. Persistent nausea and vomiting.  FOLLOW-UP:  You should have an appointment to return to the office in 10-14 days after surgery. Arrangements have been made for continuation of Physical Therapy (either home therapy or outpatient therapy).    [facility] Department Directory         [email or web address]       [email or web address]          Cardiology  Appointments: [place] [phone]  Endocrinology  Appointments: [place] [phone] [place] [phone]  Gastroenterology  Appointments: [place] [phone] [place] [phone]        General Surgery   Appointments: [facility]  Internal Medicine/Family Medicine  Appointments: [facility] [place] - [phone] [place] [phone]  Metabolic and [place] Loss Surgery  Appointments: [facility]        Neurology  Appointments: [place] [phone] [place] - [phone]  Neurosurgery  Appointments: [place]  Obstetrics & Gynecology  Appointments: [place] [phone] [place] - [phone]        Pediatrics  Appointments: [place] [phone] [place]  Appointments: [place] [phone]  Physical Therapy  Appointments: [place] - [phone]        Podiatry  Appointments: [place] [phone] [place] - [phone]  Pulmonology  Appointments: [place]  Rheumatology  Appointments: [place] - [place]  Location: [facility]  [place], [place]  [address]  [place] Location: [facility]. [address] [place], [place]   [address]  [place] Location: [facility] [address] [place], [place]  [address]

## 2022-05-15 NOTE — Patient Instructions (Addendum)
Your procedure is scheduled on:05-27-22 Wednesday Report to the Registration Desk on the 1st floor of the [facility].Then proceed to the 2nd floor Surgery Desk To find out your arrival time, please call [phone] between 1PM - 3PM on:05-26-22 Tuesday If your arrival time is 6:00 am, do not arrive prior to that time as the [facility] entrance doors do not open until 6:00 am.  REMEMBER: Instructions that are not followed completely may result in serious medical risk, up to and including death; or upon the discretion of your surgeon and anesthesiologist your surgery may need to be rescheduled.  Do not eat food after midnight the night before surgery.  No gum chewing, lozengers or hard candies.  You may however, drink CLEAR liquids up to 2 hours before you are scheduled to arrive for your surgery. Do not drink anything within 2 hours of your scheduled arrival time.  Clear liquids include: - water  - apple juice without pulp - gatorade (not RED colors) - black coffee or tea (Do NOT add milk or creamers to the coffee or tea) Do NOT drink anything that is not on this list.  In addition, your doctor has ordered for you to drink the provided  Ensure Pre-Surgery Clear Carbohydrate Drink  Drinking this carbohydrate drink up to two hours before surgery helps to reduce insulin resistance and improve patient outcomes. Please complete drinking 2 hours prior to scheduled arrival time.  TAKE THESE MEDICATIONS THE MORNING OF SURGERY WITH A SIP OF WATER: -levothyroxine (SYNTHROID)  One week prior to surgery:Last dose on 05-19-22 Tuesday Stop Anti-inflammatories (NSAIDS) such as Advil, Aleve, Ibuprofen, Motrin, Naproxen, Naprosyn and Aspirin based products such as Excedrin, Goodys Powder, BC Powder.You may however, continue to take Tylenol if needed for pain up until the day of surgery.You may continue your Celebrex up until the day prior to surgery  Stop ANY OVER THE COUNTER supplements/vitamins 7  days prior to surgery (CALCIUM 600 + D , Magnesium, Multiple Vitamin, SELENIUM, vitamin C, vitamin E )  No Alcohol for 24 hours before or after surgery.  No Smoking including e-cigarettes for 24 hours prior to surgery.  No chewable tobacco products for at least 6 hours prior to surgery.  No nicotine patches on the day of surgery.  Do not use any "recreational" drugs for at least a week prior to your surgery.  Please be advised that the combination of cocaine and anesthesia may have negative outcomes, up to and including death. If you test positive for cocaine, your surgery will be cancelled.  On the morning of surgery brush your teeth with toothpaste and water, you may rinse your mouth with mouthwash if you wish. Do not swallow any toothpaste or mouthwash.  Use CHG Soap as directed on instruction sheet.  Do not wear jewelry, make-up, hairpins, clips or nail polish.  Do not wear lotions, powders, or perfumes.   Do not shave body from the neck down 48 hours prior to surgery just in case you cut yourself which could leave a site for infection.  Also, freshly shaved skin may become irritated if using the CHG soap.  Contact lenses, hearing aids and dentures may not be worn into surgery.  Do not bring valuables to the hospital. [facility] is not responsible for any missing/lost belongings or valuables.   Notify your doctor if there is any change in your medical condition (cold, fever, infection).  Wear comfortable clothing (specific to your surgery type) to the hospital.  After surgery,  you can help prevent lung complications by doing breathing exercises.  Take deep breaths and cough every 1-2 hours. Your doctor may order a device called an Incentive Spirometer to help you take deep breaths. When coughing or sneezing, hold a pillow firmly against your incision with both hands. This is called "splinting." Doing this helps protect your incision. It also decreases belly discomfort.  If  you are being admitted to the hospital overnight, leave your suitcase in the car. After surgery it may be brought to your room.  If you are being discharged the day of surgery, you will not be allowed to drive home. You will need a responsible adult (18 years or older) to drive you home and stay with you that night.   If you are taking public transportation, you will need to have a responsible adult (18 years or older) with you. Please confirm with your physician that it is acceptable to use public transportation.   Please call the [place] Dept. at [phone] if you have any questions about these instructions.  Surgery Visitation Policy:  Patients undergoing a surgery or procedure may have two family members or support persons with them as long as the person is not COVID-19 positive or experiencing its symptoms.   Inpatient Visitation:    Visiting hours are 7 a.m. to 8 p.m. Up to four visitors are allowed at one time in a patient room, including children. The visitors may rotate out with other people during the day. One designated support person (adult) may remain overnight.

## 2022-05-18 ENCOUNTER — Telehealth (INDEPENDENT_AMBULATORY_CARE_PROVIDER_SITE_OTHER): Payer: Self-pay | Admitting: Nurse Practitioner

## 2022-05-18 NOTE — Telephone Encounter (Signed)
Patient checking to see if [employer] has compression pump that goes up to the knee. We did advise the patient to contact the company and the patient informed that she has left a message she is waiting on a return call. The patient will speak with her orthopedic surgeon tomorrow during her visit.

## 2022-05-18 NOTE — Telephone Encounter (Signed)
Patient called about the inflatable booties (she states she does not know what they are called) that goes on her leg to help the blood circulation and it goes up to her crouch and she states after surgery she will not be able to use the long ones and wants a prescription for the short ones that only comes up to the calf and not the knee.  Her surgery is scheduled for May 27, 2022.

## 2022-05-18 NOTE — Telephone Encounter (Signed)
She just needs to come in and get an RX for compression socks

## 2022-05-19 DIAGNOSIS — M1712 Unilateral primary osteoarthritis, left knee: Secondary | ICD-10-CM | POA: Diagnosis not present

## 2022-05-21 ENCOUNTER — Telehealth (INDEPENDENT_AMBULATORY_CARE_PROVIDER_SITE_OTHER): Payer: Self-pay

## 2022-05-21 NOTE — Telephone Encounter (Signed)
Pt is calling again about this message below stating "is schedule to have surgery on 5/31 with Dr [name]. Patient was informed that she will have her leg wrap after surgery for 7 days and she has some concerns with developing ulcers. The patient would like for our office to suggest to her orthopedic surgeon to not wrap her legs that amount of time."Pt was also requesting Dr. Danise Mina to look at her photographs for her upcoming knee surgery and send them to Dr. [name]." Please return a call back when possible, thanks.   Callback Number: [phone]

## 2022-05-21 NOTE — Telephone Encounter (Signed)
Patient reach out to the office informing that she is schedule to have surgery on 5/31 with Dr [name]. Patient was informed that she will have her leg wrap after surgery for 7 days and she has some concerns with developing ulcers. The patient would like for our office to suggest to her orthopedic surgeon to not wrap her legs that amount of time. Patient was made aware that our providers recommend for her to follow up with Dr [name] post surgery instructions because if not wrapping may cause some issues. I explained to the patient it was would be best to contact her orthopedic office so they can explain how they do there wrapping.

## 2022-05-26 ENCOUNTER — Ambulatory Visit (INDEPENDENT_AMBULATORY_CARE_PROVIDER_SITE_OTHER): Payer: Medicare Other | Admitting: Psychology

## 2022-05-26 DIAGNOSIS — F4323 Adjustment disorder with mixed anxiety and depressed mood: Secondary | ICD-10-CM

## 2022-05-26 NOTE — Progress Notes (Signed)
[place] Counselor/Therapist Progress Note  Patient ID: Beth Rodgers, MRN: [id number]    Date: 05/26/22  Time Spent: 1:01 pm - 1:45 pm : 44 Minutes  Treatment Type: Individual Therapy.  Reported Symptoms: Rumination, anxiety.    Mental Status Exam: Appearance:  NA     Behavior: Appropriate  Motor: NA  Speech/Language:  Clear and Coherent and Normal Rate  Affect: Congruent  Mood: dysthymic  Thought process: normal  Thought content:   WNL  Sensory/Perceptual disturbances:   WNL  Orientation: oriented to person, place, time/date, and situation  Attention: Good  Concentration: Good  Memory: WNL  Fund of knowledge:  Good  Insight:   Good  Judgment:  Good  Impulse Control: Good   Risk Assessment: Danger to Self:  No Self-injurious Behavior: No  Danger to Others: No Duty to Warn:no Physical Aggression / Violence:No  Access to Firearms a concern: No  Gang Involvement:No   Subjective:   Beth Rodgers participated from home, via phone, and consented to treatment. Therapist participated from office. We met online due to [place] pandemic. Beth Rodgers reviewed the events of the past two weeks. Beth Rodgers  noted her upcoming surgery on 05/17/22 and noted her attempts to prepare for "every contingency". She noted concern regarding her aftercare. She noted her worry about her son and his possible behavior while she is away from the home. She noted her efforts to become more accepting of her son's severe mental health concerns. Therapist encouraged boundaries for others and maintaining positive contact and not engaging in negative conversations. Therapist encouraged self-care and managing symptoms. Therapist praised Beth Rodgers for her efforts to be more accepting of Beth Rodgers's mental health concerns and to focus on positives. Beth Rodgers was engaged and motivated during the session. Therapist validated Beth Rodgers's feelings and provided supportive therapy.   Interventions: Interpersonal  Diagnosis: Adjustment  disorder with mixed anxiety and depressed mood  Plan: Patient is to use CBT, BA, Problem-solving, mindfulness and coping skills to help manage decrease symptoms associated with their diagnosis. (Target date: 08/16/22)   Long-term goal:   Reduce overall level, frequency, and intensity of the feelings of depression and anxiety evidenced by   decreased sadness, rumination, lethargy, and interpersonal stressors  from 6 to 7 days/week to 0 to 1 days/week per client report for at least 3 consecutive months.  Short-term goal:  Verbally express understanding of the relationship between feelings of depression, anxiety and their impact on thinking patterns and behaviors.  Verbalize an understanding of the role that distorted thinking plays in creating fears, excessive worry, and ruminations.  Engage in enjoyable activities on a consistent basis.  Beth Irish, LCSW

## 2022-05-27 ENCOUNTER — Inpatient Hospital Stay
Admission: AD | Admit: 2022-05-27 | Discharge: 2022-05-30 | DRG: 470 | Disposition: A | Discharge status: Home Health | Payer: Medicare Other | Attending: Orthopedic Surgery | Admitting: Orthopedic Surgery

## 2022-05-27 ENCOUNTER — Other Ambulatory Visit: Payer: Self-pay

## 2022-05-27 ENCOUNTER — Encounter
Admission: AD | Disposition: A | Discharge status: Home Health | Payer: Self-pay | Source: Home / Self Care | Attending: Orthopedic Surgery

## 2022-05-27 ENCOUNTER — Ambulatory Visit: Payer: Medicare Other | Admitting: Urgent Care

## 2022-05-27 ENCOUNTER — Observation Stay: Payer: Medicare Other

## 2022-05-27 ENCOUNTER — Encounter: Payer: Self-pay | Admitting: Orthopedic Surgery

## 2022-05-27 DIAGNOSIS — Z79890 Hormone replacement therapy (postmenopausal): Secondary | ICD-10-CM | POA: Diagnosis not present

## 2022-05-27 DIAGNOSIS — R0989 Other specified symptoms and signs involving the circulatory and respiratory systems: Secondary | ICD-10-CM

## 2022-05-27 DIAGNOSIS — C50812 Malignant neoplasm of overlapping sites of left female breast: Secondary | ICD-10-CM

## 2022-05-27 DIAGNOSIS — M85852 Other specified disorders of bone density and structure, left thigh: Secondary | ICD-10-CM

## 2022-05-27 DIAGNOSIS — Z853 Personal history of malignant neoplasm of breast: Secondary | ICD-10-CM | POA: Diagnosis not present

## 2022-05-27 DIAGNOSIS — Z7983 Long term (current) use of bisphosphonates: Secondary | ICD-10-CM | POA: Diagnosis not present

## 2022-05-27 DIAGNOSIS — R1312 Dysphagia, oropharyngeal phase: Secondary | ICD-10-CM

## 2022-05-27 DIAGNOSIS — Z823 Family history of stroke: Secondary | ICD-10-CM

## 2022-05-27 DIAGNOSIS — E785 Hyperlipidemia, unspecified: Secondary | ICD-10-CM | POA: Diagnosis present

## 2022-05-27 DIAGNOSIS — Z808 Family history of malignant neoplasm of other organs or systems: Secondary | ICD-10-CM | POA: Diagnosis not present

## 2022-05-27 DIAGNOSIS — R5383 Other fatigue: Secondary | ICD-10-CM

## 2022-05-27 DIAGNOSIS — Z96659 Presence of unspecified artificial knee joint: Secondary | ICD-10-CM

## 2022-05-27 DIAGNOSIS — M1712 Unilateral primary osteoarthritis, left knee: Secondary | ICD-10-CM | POA: Diagnosis not present

## 2022-05-27 DIAGNOSIS — Z96652 Presence of left artificial knee joint: Secondary | ICD-10-CM | POA: Diagnosis not present

## 2022-05-27 DIAGNOSIS — E559 Vitamin D deficiency, unspecified: Secondary | ICD-10-CM | POA: Diagnosis present

## 2022-05-27 DIAGNOSIS — K219 Gastro-esophageal reflux disease without esophagitis: Secondary | ICD-10-CM | POA: Diagnosis present

## 2022-05-27 DIAGNOSIS — E039 Hypothyroidism, unspecified: Secondary | ICD-10-CM | POA: Diagnosis present

## 2022-05-27 DIAGNOSIS — I482 Chronic atrial fibrillation, unspecified: Secondary | ICD-10-CM | POA: Diagnosis not present

## 2022-05-27 DIAGNOSIS — I890 Lymphedema, not elsewhere classified: Secondary | ICD-10-CM

## 2022-05-27 DIAGNOSIS — Z79899 Other long term (current) drug therapy: Secondary | ICD-10-CM

## 2022-05-27 DIAGNOSIS — Z803 Family history of malignant neoplasm of breast: Secondary | ICD-10-CM

## 2022-05-27 DIAGNOSIS — I10 Essential (primary) hypertension: Secondary | ICD-10-CM | POA: Diagnosis present

## 2022-05-27 DIAGNOSIS — I4891 Unspecified atrial fibrillation: Secondary | ICD-10-CM | POA: Diagnosis not present

## 2022-05-27 DIAGNOSIS — Z01810 Encounter for preprocedural cardiovascular examination: Secondary | ICD-10-CM | POA: Diagnosis not present

## 2022-05-27 DIAGNOSIS — Z170 Estrogen receptor positive status [ER+]: Secondary | ICD-10-CM

## 2022-05-27 DIAGNOSIS — R600 Localized edema: Secondary | ICD-10-CM

## 2022-05-27 DIAGNOSIS — Z8249 Family history of ischemic heart disease and other diseases of the circulatory system: Secondary | ICD-10-CM | POA: Diagnosis not present

## 2022-05-27 DIAGNOSIS — R079 Chest pain, unspecified: Secondary | ICD-10-CM

## 2022-05-27 DIAGNOSIS — Z85828 Personal history of other malignant neoplasm of skin: Secondary | ICD-10-CM | POA: Diagnosis not present

## 2022-05-27 DIAGNOSIS — R0789 Other chest pain: Secondary | ICD-10-CM

## 2022-05-27 DIAGNOSIS — Z833 Family history of diabetes mellitus: Secondary | ICD-10-CM | POA: Diagnosis not present

## 2022-05-27 DIAGNOSIS — F419 Anxiety disorder, unspecified: Secondary | ICD-10-CM | POA: Diagnosis present

## 2022-05-27 DIAGNOSIS — R42 Dizziness and giddiness: Secondary | ICD-10-CM

## 2022-05-27 HISTORY — PX: KNEE ARTHROPLASTY: SHX992

## 2022-05-27 LAB — ABO/RH: ABO/RH(D): O POS

## 2022-05-27 SURGERY — ARTHROPLASTY, KNEE, TOTAL, USING IMAGELESS COMPUTER-ASSISTED NAVIGATION
Anesthesia: General | Site: Knee | Laterality: Left

## 2022-05-27 MED ORDER — ENOXAPARIN SODIUM 30 MG/0.3ML IJ SOSY
30.0000 mg | PREFILLED_SYRINGE | Freq: Two times a day (BID) | INTRAMUSCULAR | Status: DC
  Administered 2022-05-28 – 2022-05-29 (×3): 30 mg via SUBCUTANEOUS
  Filled 2022-05-27 (×3): qty 0.3

## 2022-05-27 MED ORDER — CEFAZOLIN SODIUM-DEXTROSE 2-4 GM/100ML-% IV SOLN
INTRAVENOUS | Status: AC
  Filled 2022-05-27: qty 100

## 2022-05-27 MED ORDER — BUPIVACAINE HCL (PF) 0.25 % IJ SOLN
INTRAMUSCULAR | Status: AC
  Filled 2022-05-27: qty 120

## 2022-05-27 MED ORDER — LACTATED RINGERS IV SOLN: INTRAVENOUS | Status: DC

## 2022-05-27 MED ORDER — VITAMIN E 45 MG (100 UNIT) PO CAPS: 400.0000 [IU] | ORAL_CAPSULE | Freq: Every day | ORAL | Status: DC

## 2022-05-27 MED ORDER — FENTANYL CITRATE (PF) 100 MCG/2ML IJ SOLN
INTRAMUSCULAR | Status: AC
  Filled 2022-05-27: qty 2

## 2022-05-27 MED ORDER — ACETAMINOPHEN 10 MG/ML IV SOLN
INTRAVENOUS | Status: DC | PRN
  Administered 2022-05-27: 1000 mg via INTRAVENOUS

## 2022-05-27 MED ORDER — CALCIUM CARB-CHOLECALCIFEROL 600-5 MG-MCG PO TABS: 1.0000 | ORAL_TABLET | Freq: Every day | ORAL | Status: DC

## 2022-05-27 MED ORDER — ONDANSETRON HCL 4 MG/2ML IJ SOLN
INTRAMUSCULAR | Status: DC | PRN
  Administered 2022-05-27: 4 mg via INTRAVENOUS

## 2022-05-27 MED ORDER — EPHEDRINE SULFATE (PRESSORS) 50 MG/ML IJ SOLN
INTRAMUSCULAR | Status: DC | PRN
  Administered 2022-05-27 (×2): 10 mg via INTRAVENOUS
  Administered 2022-05-27: 5 mg via INTRAVENOUS

## 2022-05-27 MED ORDER — OXYCODONE HCL 5 MG PO TABS
5.0000 mg | ORAL_TABLET | Freq: Once | ORAL | Status: AC | PRN
  Administered 2022-05-27: 5 mg via ORAL

## 2022-05-27 MED ORDER — OXYCODONE HCL 5 MG/5ML PO SOLN: 5.0000 mg | Freq: Once | ORAL | Status: AC | PRN

## 2022-05-27 MED ORDER — ACETAMINOPHEN 325 MG PO TABS
325.0000 mg | ORAL_TABLET | Freq: Four times a day (QID) | ORAL | Status: DC | PRN
  Administered 2022-05-28: 325 mg via ORAL
  Filled 2022-05-27: qty 1

## 2022-05-27 MED ORDER — CICLOPIROX 0.77 % EX GEL: 1.0000 "application " | Freq: Two times a day (BID) | CUTANEOUS | Status: DC | PRN

## 2022-05-27 MED ORDER — PROPOFOL 1000 MG/100ML IV EMUL
INTRAVENOUS | Status: AC
  Filled 2022-05-27: qty 100

## 2022-05-27 MED ORDER — CEFAZOLIN SODIUM-DEXTROSE 2-4 GM/100ML-% IV SOLN
2.0000 g | Freq: Four times a day (QID) | INTRAVENOUS | Status: AC
  Administered 2022-05-27 (×2): 2 g via INTRAVENOUS
  Filled 2022-05-27 (×2): qty 100

## 2022-05-27 MED ORDER — HYDROMORPHONE HCL 1 MG/ML IJ SOLN: 0.5000 mg | INTRAMUSCULAR | Status: DC | PRN

## 2022-05-27 MED ORDER — FENTANYL CITRATE (PF) 100 MCG/2ML IJ SOLN
INTRAMUSCULAR | Status: DC | PRN
  Administered 2022-05-27 (×4): 50 ug via INTRAVENOUS
  Administered 2022-05-27: 25 ug via INTRAVENOUS
  Administered 2022-05-27: 50 ug via INTRAVENOUS
  Administered 2022-05-27: 25 ug via INTRAVENOUS

## 2022-05-27 MED ORDER — SODIUM CHLORIDE FLUSH 0.9 % IV SOLN
INTRAVENOUS | Status: AC
  Filled 2022-05-27: qty 80

## 2022-05-27 MED ORDER — CHLORHEXIDINE GLUCONATE 0.12 % MT SOLN: 15.0000 mL | Freq: Once | OROMUCOSAL | Status: AC

## 2022-05-27 MED ORDER — MIDAZOLAM HCL 2 MG/2ML IJ SOLN
INTRAMUSCULAR | Status: DC | PRN
  Administered 2022-05-27 (×2): 1 mg via INTRAVENOUS

## 2022-05-27 MED ORDER — OXYCODONE HCL 5 MG PO TABS
ORAL_TABLET | ORAL | Status: AC
  Administered 2022-05-27: 10 mg
  Filled 2022-05-27: qty 1

## 2022-05-27 MED ORDER — HYDROMORPHONE HCL 1 MG/ML IJ SOLN
0.5000 mg | INTRAMUSCULAR | Status: DC | PRN
  Administered 2022-05-27: 0.5 mg via INTRAVENOUS

## 2022-05-27 MED ORDER — OXYCODONE HCL 5 MG PO TABS
5.0000 mg | ORAL_TABLET | ORAL | Status: DC | PRN
  Administered 2022-05-28 – 2022-05-30 (×7): 5 mg via ORAL
  Filled 2022-05-27 (×7): qty 1

## 2022-05-27 MED ORDER — TRANEXAMIC ACID-NACL 1000-0.7 MG/100ML-% IV SOLN: 1000.0000 mg | INTRAVENOUS | Status: DC

## 2022-05-27 MED ORDER — HYDROMORPHONE HCL 1 MG/ML IJ SOLN
INTRAMUSCULAR | Status: AC
  Filled 2022-05-27: qty 1

## 2022-05-27 MED ORDER — LIDOCAINE HCL (CARDIAC) PF 100 MG/5ML IV SOSY
PREFILLED_SYRINGE | INTRAVENOUS | Status: DC | PRN
  Administered 2022-05-27: 60 mg via INTRAVENOUS

## 2022-05-27 MED ORDER — CHLORHEXIDINE GLUCONATE 4 % EX LIQD: 60.0000 mL | Freq: Once | CUTANEOUS | Status: DC

## 2022-05-27 MED ORDER — BUPIVACAINE LIPOSOME 1.3 % IJ SUSP
INTRAMUSCULAR | Status: AC
  Filled 2022-05-27: qty 40

## 2022-05-27 MED ORDER — ENSURE PRE-SURGERY PO LIQD
296.0000 mL | Freq: Once | ORAL | Status: DC
  Filled 2022-05-27: qty 296

## 2022-05-27 MED ORDER — TRANEXAMIC ACID-NACL 1000-0.7 MG/100ML-% IV SOLN
INTRAVENOUS | Status: AC
  Filled 2022-05-27: qty 100

## 2022-05-27 MED ORDER — BISACODYL 10 MG RE SUPP: 10.0000 mg | Freq: Every day | RECTAL | Status: DC | PRN

## 2022-05-27 MED ORDER — ONDANSETRON HCL 4 MG PO TABS: 4.0000 mg | ORAL_TABLET | Freq: Four times a day (QID) | ORAL | Status: DC | PRN

## 2022-05-27 MED ORDER — TRAMADOL HCL 50 MG PO TABS
50.0000 mg | ORAL_TABLET | ORAL | Status: DC | PRN
  Administered 2022-05-27: 50 mg via ORAL
  Administered 2022-05-27: 100 mg via ORAL
  Administered 2022-05-29 – 2022-05-30 (×4): 50 mg via ORAL
  Filled 2022-05-27: qty 1
  Filled 2022-05-27: qty 2
  Filled 2022-05-27 (×3): qty 1
  Filled 2022-05-27: qty 2
  Filled 2022-05-27: qty 1

## 2022-05-27 MED ORDER — METOPROLOL TARTRATE 5 MG/5ML IV SOLN
INTRAVENOUS | Status: DC | PRN
  Administered 2022-05-27: 2 mg via INTRAVENOUS

## 2022-05-27 MED ORDER — CEFAZOLIN SODIUM-DEXTROSE 2-4 GM/100ML-% IV SOLN
2.0000 g | INTRAVENOUS | Status: AC
  Administered 2022-05-27: 2 g via INTRAVENOUS

## 2022-05-27 MED ORDER — MIDAZOLAM HCL 2 MG/2ML IJ SOLN
INTRAMUSCULAR | Status: AC
  Filled 2022-05-27: qty 2

## 2022-05-27 MED ORDER — FENTANYL CITRATE (PF) 100 MCG/2ML IJ SOLN
25.0000 ug | INTRAMUSCULAR | Status: DC | PRN
  Administered 2022-05-27: 50 ug via INTRAVENOUS

## 2022-05-27 MED ORDER — MENTHOL 3 MG MT LOZG: 1.0000 | LOZENGE | OROMUCOSAL | Status: DC | PRN

## 2022-05-27 MED ORDER — ONDANSETRON HCL 4 MG/2ML IJ SOLN
4.0000 mg | Freq: Four times a day (QID) | INTRAMUSCULAR | Status: DC | PRN
  Administered 2022-05-27: 4 mg via INTRAVENOUS
  Filled 2022-05-27: qty 2

## 2022-05-27 MED ORDER — TRANEXAMIC ACID-NACL 1000-0.7 MG/100ML-% IV SOLN: 1000.0000 mg | Freq: Once | INTRAVENOUS | Status: AC

## 2022-05-27 MED ORDER — CHLORHEXIDINE GLUCONATE 0.12 % MT SOLN
OROMUCOSAL | Status: AC
  Administered 2022-05-27: 15 mL via OROMUCOSAL
  Filled 2022-05-27: qty 15

## 2022-05-27 MED ORDER — ACETAMINOPHEN 10 MG/ML IV SOLN
1000.0000 mg | Freq: Four times a day (QID) | INTRAVENOUS | Status: AC
  Administered 2022-05-27 – 2022-05-28 (×4): 1000 mg via INTRAVENOUS
  Filled 2022-05-27 (×4): qty 100

## 2022-05-27 MED ORDER — DEXAMETHASONE SODIUM PHOSPHATE 10 MG/ML IJ SOLN: 8.0000 mg | Freq: Once | INTRAMUSCULAR | Status: AC

## 2022-05-27 MED ORDER — ADULT MULTIVITAMIN W/MINERALS CH: 1.0000 | ORAL_TABLET | Freq: Every day | ORAL | Status: DC

## 2022-05-27 MED ORDER — CELECOXIB 200 MG PO CAPS
400.0000 mg | ORAL_CAPSULE | Freq: Once | ORAL | Status: AC
Start: 2022-05-27 — End: 2022-05-27

## 2022-05-27 MED ORDER — PANTOPRAZOLE SODIUM 40 MG PO TBEC
40.0000 mg | DELAYED_RELEASE_TABLET | Freq: Two times a day (BID) | ORAL | Status: DC
  Administered 2022-05-27 – 2022-05-30 (×7): 40 mg via ORAL
  Filled 2022-05-27 (×7): qty 1

## 2022-05-27 MED ORDER — 0.9 % SODIUM CHLORIDE (POUR BTL) OPTIME
TOPICAL | Status: DC | PRN
  Administered 2022-05-27: 500 mL

## 2022-05-27 MED ORDER — HYDRALAZINE HCL 20 MG/ML IJ SOLN
INTRAMUSCULAR | Status: AC
  Filled 2022-05-27: qty 1

## 2022-05-27 MED ORDER — FLEET ENEMA 7-19 GM/118ML RE ENEM
1.0000 | ENEMA | Freq: Once | RECTAL | Status: AC | PRN
  Administered 2022-05-29: 1 via RECTAL

## 2022-05-27 MED ORDER — ASCORBIC ACID 500 MG PO TABS: 500.0000 mg | ORAL_TABLET | Freq: Every day | ORAL | Status: DC

## 2022-05-27 MED ORDER — LEVOTHYROXINE SODIUM 50 MCG PO TABS
75.0000 ug | ORAL_TABLET | Freq: Every day | ORAL | Status: DC
  Administered 2022-05-28 – 2022-05-30 (×3): 75 ug via ORAL
  Filled 2022-05-27 (×3): qty 1

## 2022-05-27 MED ORDER — ROFLUMILAST 0.3 % EX CREA: 1.0000 "application " | TOPICAL_CREAM | CUTANEOUS | Status: DC

## 2022-05-27 MED ORDER — PHENYLEPHRINE HCL (PRESSORS) 10 MG/ML IV SOLN
INTRAVENOUS | Status: AC
  Filled 2022-05-27: qty 1

## 2022-05-27 MED ORDER — PROPOFOL 10 MG/ML IV BOLUS
INTRAVENOUS | Status: DC | PRN
  Administered 2022-05-27: 60 mg via INTRAVENOUS

## 2022-05-27 MED ORDER — FERROUS SULFATE 325 (65 FE) MG PO TABS
325.0000 mg | ORAL_TABLET | Freq: Two times a day (BID) | ORAL | Status: DC
  Administered 2022-05-27 – 2022-05-28 (×2): 325 mg via ORAL
  Filled 2022-05-27 (×5): qty 1

## 2022-05-27 MED ORDER — HALOPERIDOL LACTATE 5 MG/ML IJ SOLN
1.0000 mg | Freq: Once | INTRAMUSCULAR | Status: AC
  Administered 2022-05-27: 1 mg via INTRAVENOUS

## 2022-05-27 MED ORDER — ALUM & MAG HYDROXIDE-SIMETH 200-200-20 MG/5ML PO SUSP: 30.0000 mL | ORAL | Status: DC | PRN

## 2022-05-27 MED ORDER — MAGNESIUM HYDROXIDE 400 MG/5ML PO SUSP
30.0000 mL | Freq: Every day | ORAL | Status: DC
Start: 2022-05-27 — End: 2022-05-30
  Administered 2022-05-27 – 2022-05-30 (×4): 30 mL via ORAL
  Filled 2022-05-27 (×3): qty 30

## 2022-05-27 MED ORDER — MAGNESIUM 400 MG PO TABS: 400.0000 mg | ORAL_TABLET | Freq: Every day | ORAL | Status: DC

## 2022-05-27 MED ORDER — ACETAMINOPHEN 10 MG/ML IV SOLN
INTRAVENOUS | Status: AC
  Filled 2022-05-27: qty 100

## 2022-05-27 MED ORDER — SODIUM CHLORIDE 0.9 % IR SOLN
Status: DC | PRN
  Administered 2022-05-27: 3000 mL

## 2022-05-27 MED ORDER — METOCLOPRAMIDE HCL 10 MG PO TABS
10.0000 mg | ORAL_TABLET | Freq: Three times a day (TID) | ORAL | Status: AC
  Administered 2022-05-27 – 2022-05-29 (×7): 10 mg via ORAL
  Filled 2022-05-27 (×16): qty 1

## 2022-05-27 MED ORDER — LOSARTAN POTASSIUM 50 MG PO TABS
100.0000 mg | ORAL_TABLET | Freq: Every day | ORAL | Status: DC
Start: 2022-05-28 — End: 2022-05-29
  Administered 2022-05-29: 100 mg via ORAL
  Filled 2022-05-27 (×2): qty 2

## 2022-05-27 MED ORDER — SUGAMMADEX SODIUM 200 MG/2ML IV SOLN
INTRAVENOUS | Status: DC | PRN
  Administered 2022-05-27: 200 mg via INTRAVENOUS

## 2022-05-27 MED ORDER — FAMOTIDINE 20 MG PO TABS
20.0000 mg | ORAL_TABLET | Freq: Once | ORAL | Status: AC
Start: 2022-05-27 — End: 2022-05-27

## 2022-05-27 MED ORDER — FAMOTIDINE 20 MG PO TABS
ORAL_TABLET | ORAL | Status: AC
  Administered 2022-05-27: 20 mg via ORAL
  Filled 2022-05-27: qty 1

## 2022-05-27 MED ORDER — ROCURONIUM BROMIDE 100 MG/10ML IV SOLN
INTRAVENOUS | Status: DC | PRN
  Administered 2022-05-27: 50 mg via INTRAVENOUS
  Administered 2022-05-27: 20 mg via INTRAVENOUS
  Administered 2022-05-27: 30 mg via INTRAVENOUS

## 2022-05-27 MED ORDER — DIPHENHYDRAMINE HCL 12.5 MG/5ML PO ELIX: 12.5000 mg | ORAL_SOLUTION | ORAL | Status: DC | PRN

## 2022-05-27 MED ORDER — HALOPERIDOL LACTATE 5 MG/ML IJ SOLN
INTRAMUSCULAR | Status: AC
  Filled 2022-05-27: qty 1

## 2022-05-27 MED ORDER — PHENOL 1.4 % MT LIQD: 1.0000 | OROMUCOSAL | Status: DC | PRN

## 2022-05-27 MED ORDER — SODIUM CHLORIDE (PF) 0.9 % IJ SOLN
INTRAMUSCULAR | Status: DC | PRN
  Administered 2022-05-27: 120 mL via INTRAMUSCULAR

## 2022-05-27 MED ORDER — DEXAMETHASONE SODIUM PHOSPHATE 10 MG/ML IJ SOLN
INTRAMUSCULAR | Status: AC
  Administered 2022-05-27: 8 mg via INTRAVENOUS
  Filled 2022-05-27: qty 1

## 2022-05-27 MED ORDER — SURGIPHOR WOUND IRRIGATION SYSTEM - OPTIME
TOPICAL | Status: DC | PRN
  Administered 2022-05-27: 1

## 2022-05-27 MED ORDER — CELECOXIB 200 MG PO CAPS
200.0000 mg | ORAL_CAPSULE | Freq: Two times a day (BID) | ORAL | Status: DC
  Administered 2022-05-28 – 2022-05-30 (×5): 200 mg via ORAL
  Filled 2022-05-27 (×5): qty 1

## 2022-05-27 MED ORDER — GABAPENTIN 300 MG PO CAPS: 300.0000 mg | ORAL_CAPSULE | Freq: Once | ORAL | Status: AC

## 2022-05-27 MED ORDER — ORAL CARE MOUTH RINSE: 15.0000 mL | Freq: Once | OROMUCOSAL | Status: AC

## 2022-05-27 MED ORDER — OXYCODONE HCL 5 MG PO TABS
10.0000 mg | ORAL_TABLET | ORAL | Status: DC | PRN
  Filled 2022-05-27 (×2): qty 2

## 2022-05-27 MED ORDER — SENNOSIDES-DOCUSATE SODIUM 8.6-50 MG PO TABS
1.0000 | ORAL_TABLET | Freq: Two times a day (BID) | ORAL | Status: DC
  Administered 2022-05-27 – 2022-05-30 (×7): 1 via ORAL
  Filled 2022-05-27 (×7): qty 1

## 2022-05-27 MED ORDER — GABAPENTIN 300 MG PO CAPS
ORAL_CAPSULE | ORAL | Status: AC
  Administered 2022-05-27: 300 mg via ORAL
  Filled 2022-05-27: qty 1

## 2022-05-27 MED ORDER — METOPROLOL TARTRATE 5 MG/5ML IV SOLN
INTRAVENOUS | Status: AC
  Filled 2022-05-27: qty 5

## 2022-05-27 MED ORDER — SODIUM CHLORIDE 0.9 % IV SOLN
INTRAVENOUS | Status: DC
Start: 2022-05-27 — End: 2022-05-30

## 2022-05-27 MED ORDER — CELECOXIB 200 MG PO CAPS
ORAL_CAPSULE | ORAL | Status: AC
  Administered 2022-05-27: 400 mg via ORAL
  Filled 2022-05-27: qty 2

## 2022-05-27 MED ORDER — TRANEXAMIC ACID-NACL 1000-0.7 MG/100ML-% IV SOLN
INTRAVENOUS | Status: AC
  Administered 2022-05-27: 1000 mg via INTRAVENOUS
  Filled 2022-05-27: qty 100

## 2022-05-27 MED ORDER — HYDRALAZINE HCL 20 MG/ML IJ SOLN
INTRAMUSCULAR | Status: DC | PRN
  Administered 2022-05-27 (×2): 5 mg via INTRAVENOUS

## 2022-05-27 MED ORDER — LATANOPROST 0.005 % OP SOLN: 1.0000 [drp] | Freq: Every day | OPHTHALMIC | Status: DC

## 2022-05-27 SURGICAL SUPPLY — 77 items
ATTUNE MED DOME PAT 32 KNEE (Knees) ×1 IMPLANT
ATTUNE PSFEM LTSZ6 NARCEM KNEE (Femur) ×1 IMPLANT
ATTUNE PSRP INSR SZ6 7 KNEE (Insert) ×1 IMPLANT
BASE TIBIAL ROT PLAT SZ 5 KNEE (Knees) IMPLANT
BATTERY INSTRU NAVIGATION (MISCELLANEOUS) ×8 IMPLANT
BLADE SAW 70X12.5 (BLADE) ×2 IMPLANT
BLADE SAW 90X13X1.19 OSCILLAT (BLADE) ×2 IMPLANT
BLADE SAW 90X25X1.19 OSCILLAT (BLADE) ×2 IMPLANT
BONE CEMENT GENTAMICIN (Cement) ×4 IMPLANT
BSPLAT TIB 5 CMNT ROT PLAT STR (Knees) ×1 IMPLANT
BTRY SRG DRVR LF (MISCELLANEOUS) ×4
CEMENT BONE GENTAMICIN 40 (Cement) IMPLANT
CEMENT HV SMART SET (Cement) IMPLANT
COOLER POLAR GLACIER W/PUMP (MISCELLANEOUS) ×2 IMPLANT
CUFF TOURN SGL QUICK 24 (TOURNIQUET CUFF)
CUFF TOURN SGL QUICK 34 (TOURNIQUET CUFF)
CUFF TRNQT CYL 24X4X16.5-23 (TOURNIQUET CUFF) IMPLANT
CUFF TRNQT CYL 34X4.125X (TOURNIQUET CUFF) IMPLANT
DRAPE 3/4 80X56 (DRAPES) ×2 IMPLANT
DRAPE INCISE IOBAN 66X45 STRL (DRAPES) IMPLANT
DRSG DERMACEA 8X12 NADH (GAUZE/BANDAGES/DRESSINGS) ×2 IMPLANT
DRSG MEPILEX SACRM 8.7X9.8 (GAUZE/BANDAGES/DRESSINGS) ×2 IMPLANT
DRSG OPSITE POSTOP 4X14 (GAUZE/BANDAGES/DRESSINGS) ×2 IMPLANT
DRSG TEGADERM 4X4.75 (GAUZE/BANDAGES/DRESSINGS) ×2 IMPLANT
DRSG VASELINE 3X18 (GAUZE/BANDAGES/DRESSINGS) ×1 IMPLANT
DURAPREP 26ML APPLICATOR (WOUND CARE) ×4 IMPLANT
ELECT CAUTERY BLADE 6.4 (BLADE) ×2 IMPLANT
ELECT REM PT RETURN 9FT ADLT (ELECTROSURGICAL) ×2
ELECTRODE REM PT RTRN 9FT ADLT (ELECTROSURGICAL) ×1 IMPLANT
EX-PIN ORTHOLOCK NAV 4X150 (PIN) ×4 IMPLANT
GLOVE BIOGEL M STRL SZ7.5 (GLOVE) ×8 IMPLANT
GLOVE BIOGEL PI IND STRL 8 (GLOVE) ×1 IMPLANT
GLOVE BIOGEL PI INDICATOR 8 (GLOVE) ×1
GLOVE SURG UNDER POLY LF SZ7.5 (GLOVE) ×2 IMPLANT
GOWN STRL REUS W/ TWL LRG LVL3 (GOWN DISPOSABLE) ×2 IMPLANT
GOWN STRL REUS W/ TWL XL LVL3 (GOWN DISPOSABLE) ×1 IMPLANT
GOWN STRL REUS W/TWL LRG LVL3 (GOWN DISPOSABLE) ×4
GOWN STRL REUS W/TWL XL LVL3 (GOWN DISPOSABLE) ×2
HEMOVAC 400CC 10FR (MISCELLANEOUS) ×2 IMPLANT
HOLDER FOLEY CATH W/STRAP (MISCELLANEOUS) ×2 IMPLANT
HOLSTER ELECTROSUGICAL PENCIL (MISCELLANEOUS) ×1 IMPLANT
HOOD PEEL AWAY FLYTE STAYCOOL (MISCELLANEOUS) ×4 IMPLANT
IV NS IRRIG 3000ML ARTHROMATIC (IV SOLUTION) ×2 IMPLANT
KIT TURNOVER KIT A (KITS) ×2 IMPLANT
KNIFE SCULPS 14X20 (INSTRUMENTS) ×2 IMPLANT
MANIFOLD NEPTUNE II (INSTRUMENTS) ×4 IMPLANT
NDL SPNL 20GX3.5 QUINCKE YW (NEEDLE) ×2 IMPLANT
NEEDLE SPNL 20GX3.5 QUINCKE YW (NEEDLE) ×4 IMPLANT
NS IRRIG 500ML POUR BTL (IV SOLUTION) ×2 IMPLANT
PACK TOTAL KNEE (MISCELLANEOUS) ×2 IMPLANT
PAD ABD DERMACEA PRESS 5X9 (GAUZE/BANDAGES/DRESSINGS) ×4 IMPLANT
PAD WRAPON POLAR KNEE (MISCELLANEOUS) ×1 IMPLANT
PIN DRILL FIX HALF THREAD (BIT) ×4 IMPLANT
PIN DRILL QUICK PACK ×2 IMPLANT
PIN FIXATION 1/8DIA X 3INL (PIN) ×2 IMPLANT
PULSAVAC PLUS IRRIG FAN TIP (DISPOSABLE) ×2
SOL PREP PVP 2OZ (MISCELLANEOUS) ×2
SOLUTION IRRIG SURGIPHOR (IV SOLUTION) ×2 IMPLANT
SOLUTION PREP PVP 2OZ (MISCELLANEOUS) ×1 IMPLANT
SPONGE DRAIN TRACH 4X4 STRL 2S (GAUZE/BANDAGES/DRESSINGS) ×2 IMPLANT
STAPLER SKIN PROX 35W (STAPLE) ×2 IMPLANT
STOCKINETTE IMPERV 14X48 (MISCELLANEOUS) IMPLANT
STRAP TIBIA SHORT (MISCELLANEOUS) ×2 IMPLANT
SUCTION FRAZIER HANDLE 10FR (MISCELLANEOUS) ×1
SUCTION TUBE FRAZIER 10FR DISP (MISCELLANEOUS) ×1 IMPLANT
SUT VIC AB 0 CT1 36 (SUTURE) ×4 IMPLANT
SUT VIC AB 1 CT1 36 (SUTURE) ×4 IMPLANT
SUT VIC AB 2-0 CT2 27 (SUTURE) ×2 IMPLANT
SYR 30ML LL (SYRINGE) ×4 IMPLANT
TIBIAL BASE ROT PLAT SZ 5 KNEE (Knees) ×2 IMPLANT
TIP FAN IRRIG PULSAVAC PLUS (DISPOSABLE) ×1 IMPLANT
TOWEL OR 17X26 4PK STRL BLUE (TOWEL DISPOSABLE) IMPLANT
TOWER CARTRIDGE SMART MIX (DISPOSABLE) ×2 IMPLANT
TRAY FOLEY MTR SLVR 16FR STAT (SET/KITS/TRAYS/PACK) ×2 IMPLANT
WATER STERILE IRR 1000ML POUR (IV SOLUTION) ×1 IMPLANT
WATER STERILE IRR 500ML POUR (IV SOLUTION) ×1 IMPLANT
WRAPON POLAR PAD KNEE (MISCELLANEOUS) ×2

## 2022-05-27 NOTE — Progress Notes (Signed)
Patient arrived to pacu, c/o's severe pain left knee, refused spinal and block in pre-op. Anesthesia and provider reviewed all about with patient. Pain upon arrival 10/10 restless, thrashing in bed.  Anesthesia aware:  Dr. Amie Critchley, ordered total of '1mg'$  hydromorphone, '1mg'$  haldol IV. Will continue to monitor closely, review all plans of care and medication management. Patient will discuss with pharmarcy when completely awake.

## 2022-05-27 NOTE — Anesthesia Procedure Notes (Signed)
Procedure Name: Intubation Date/Time: 05/27/2022 7:38 AM Performed by: Nelda Marseille, CRNA Pre-anesthesia Checklist: Patient identified, Patient being monitored, Timeout performed, Emergency Drugs available and Suction available Patient Re-evaluated:Patient Re-evaluated prior to induction Oxygen Delivery Method: Circle system utilized Preoxygenation: Pre-oxygenation with 100% oxygen Induction Type: IV induction Ventilation: Mask ventilation without difficulty Laryngoscope Size: Mac, 3 and McGraph Grade View: Grade I Tube type: Oral Tube size: 7.0 mm Number of attempts: 1 Airway Equipment and Method: Stylet Placement Confirmation: ETT inserted through vocal cords under direct vision, positive ETCO2 and breath sounds checked- equal and bilateral Secured at: 21 cm Tube secured with: Tape Dental Injury: Teeth and Oropharynx as per pre-operative assessment

## 2022-05-27 NOTE — Telephone Encounter (Signed)
Left message on voicemail for patient to call the office back. 

## 2022-05-27 NOTE — Telephone Encounter (Signed)
Agree with her following ortho recommendations.  Recent lower extremity venous ultrasound done earlier  this month returned overall reassuring.  I hope she has a speedy recovery from surgery.

## 2022-05-27 NOTE — Addendum Note (Signed)
Addendum  created 05/27/22 1506 by Nelda Marseille, CRNA   Charge Capture section accepted

## 2022-05-27 NOTE — Op Note (Signed)
OPERATIVE NOTE  DATE OF SURGERY:  05/27/2022  PATIENT NAME:  Beth Rodgers   DOB: 1935-03-09  MRN: [id number]  PRE-OPERATIVE DIAGNOSIS: Degenerative arthrosis of the left knee, primary  POST-OPERATIVE DIAGNOSIS:  Same  PROCEDURE:  Left total knee arthroplasty using computer-assisted navigation  SURGEON:  Marciano Sequin. M.D.  ASSISTANT: Cassell Smiles, PA-C (present and scrubbed throughout the case, critical for assistance with exposure, retraction, instrumentation, and closure)  ANESTHESIA: general  ESTIMATED BLOOD LOSS: 50 mL  FLUIDS REPLACED: 1600 mL of crystalloid  TOURNIQUET TIME: 78 minutes  DRAINS: 2 medium Hemovac drains  SOFT TISSUE RELEASES: Anterior cruciate ligament, posterior cruciate ligament, deep  medial collateral ligament, patellofemoral ligament  IMPLANTS UTILIZED: DePuy Attune size 6N posterior stabilized femoral component (cemented), size 5 rotating platform tibial component (cemented), 32 mm medialized dome patella (cemented), and a 7 mm stabilized rotating platform polyethylene insert.  INDICATIONS FOR SURGERY: Beth Rodgers is a 87 y.o. year old female with a long history of progressive knee pain. X-rays demonstrated severe degenerative changes in tricompartmental fashion. The patient had not seen any significant improvement despite conservative nonsurgical intervention. After discussion of the risks and benefits of surgical intervention, the patient expressed understanding of the risks benefits and agree with plans for total knee arthroplasty.   The risks, benefits, and alternatives were discussed at length including but not limited to the risks of infection, bleeding, nerve injury, stiffness, blood clots, the need for revision surgery, cardiopulmonary complications, among others, and they were willing to proceed.  PROCEDURE IN DETAIL: The patient was brought into the operating room and, after adequate general anesthesia was achieved, a tourniquet was placed  on the patient's upper thigh. The patient's knee and leg were cleaned and prepped with alcohol and DuraPrep and draped in the usual sterile fashion. A "timeout" was performed as per usual protocol. The lower extremity was exsanguinated using an Esmarch, and the tourniquet was inflated to 300 mmHg. An anterior longitudinal incision was made followed by a standard mid vastus approach. The deep fibers of the medial collateral ligament were elevated in a subperiosteal fashion off of the medial flare of the tibia so as to maintain a continuous soft tissue sleeve. The patella was subluxed laterally and the patellofemoral ligament was incised. Inspection of the knee demonstrated severe degenerative changes with full-thickness loss of articular cartilage. Osteophytes were debrided using a rongeur. Anterior and posterior cruciate ligaments were excised. Two 4.0 mm Schanz pins were inserted in the femur and into the tibia for attachment of the array of trackers used for computer-assisted navigation. Hip center was identified using a circumduction technique. Distal landmarks were mapped using the computer. The distal femur and proximal tibia were mapped using the computer. The distal femoral cutting guide was positioned using computer-assisted navigation so as to achieve a 5 distal valgus cut. The femur was sized and it was felt that a size 6N femoral component was appropriate. A size 6 femoral cutting guide was positioned and the anterior cut was performed and verified using the computer. This was followed by completion of the posterior and chamfer cuts. Femoral cutting guide for the central box was then positioned in the center box cut was performed.  Attention was then directed to the proximal tibia. Medial and lateral menisci were excised. The extramedullary tibial cutting guide was positioned using computer-assisted navigation so as to achieve a 0 varus-valgus alignment and 3 posterior slope. The cut was performed  and verified using the computer. The proximal tibia  was sized and it was felt that a size 5 tibial tray was appropriate. Tibial and femoral trials were inserted followed by insertion of a 7 mm polyethylene insert. This allowed for excellent mediolateral soft tissue balancing both in flexion and in full extension. Finally, the patella was cut and prepared so as to accommodate a 32 mm medialized dome patella. A patella trial was placed and the knee was placed through a range of motion with excellent patellar tracking appreciated. The femoral trial was removed after debridement of posterior osteophytes. The central post-hole for the tibial component was reamed followed by insertion of a keel punch. Tibial trials were then removed. Cut surfaces of bone were irrigated with copious amounts of normal saline using pulsatile lavage and then suctioned dry. Polymethylmethacrylate cement with gentamicin was prepared in the usual fashion using a vacuum mixer. Cement was applied to the cut surface of the proximal tibia as well as along the undersurface of a size 5 rotating platform tibial component. Tibial component was positioned and impacted into place. Excess cement was removed using [employer] fast streamer. Cement was then applied to the cut surfaces of the femur as well as along the posterior flanges of the size 6N femoral component. The femoral component was positioned and impacted into place. Excess cement was removed using [employer] fast streamer. A 7 mm polyethylene trial was inserted and the knee was brought into full extension with steady axial compression applied. Finally, cement was applied to the backside of a 32 mm medialized dome patella and the patellar component was positioned and patellar clamp applied. Excess cement was removed using [employer] fast streamer. After adequate curing of the cement, the tourniquet was deflated after a total tourniquet time of 78 minutes. Hemostasis was achieved using electrocautery. The knee was irrigated  with copious amounts of normal saline using pulsatile lavage followed by 450 ml of Surgiphor and then suctioned dry. 20 mL of 1.3% Exparel and 60 mL of 0.25% Marcaine in 40 mL of normal saline was injected along the posterior capsule, medial and lateral gutters, and along the arthrotomy site. A 7 mm stabilized rotating platform polyethylene insert was inserted and the knee was placed through a range of motion with excellent mediolateral soft tissue balancing appreciated and excellent patellar tracking noted. 2 medium drains were placed in the wound bed and brought out through separate stab incisions. The medial parapatellar portion of the incision was reapproximated using interrupted sutures of #1 Vicryl. Subcutaneous tissue was approximated in layers using first #0 Vicryl followed #2-0 Vicryl. The skin was approximated with skin staples. A sterile dressing was applied.  The patient tolerated the procedure well and was transported to the recovery room in stable condition.    Ad Guttman P. Holley Bouche., M.D.

## 2022-05-27 NOTE — TOC Progression Note (Signed)
Transition of Care [facility]) - Progression Note    Patient Details  Name: Beth Rodgers MRN: [id number] Date of Birth: 09/30/1935  Transition of [facility]) CM/SW [place], RN Phone Number: 05/27/2022, 2:44 PM  Clinical Narrative:    The patient is set up with [facility] prior to Surgery by the surgeons office        Expected Discharge Plan and Services                                                 Social Determinants of Health (SDOH) Interventions    Readmission Risk Interventions     View : No data to display.

## 2022-05-27 NOTE — Transfer of Care (Signed)
Immediate Anesthesia Transfer of Care Note  Patient: Beth Rodgers  Procedure(s) Performed: COMPUTER ASSISTED TOTAL KNEE ARTHROPLASTY (Left: Knee)  Patient Location: PACU  Anesthesia Type:General  Level of Consciousness: drowsy  Airway & Oxygen Therapy: Patient Spontanous Breathing and Patient connected to face mask oxygen  Post-op Assessment: Report given to RN and Post -op Vital signs reviewed and stable  Post vital signs: Reviewed and stable  Last Vitals:  Vitals Value Taken Time  BP 107/78 05/27/22 1112  Temp    Pulse 86 05/27/22 1114  Resp 20 05/27/22 1114  SpO2 97 % 05/27/22 1114  Vitals shown include unvalidated device data.  Last Pain:  Vitals:   05/27/22 0652  TempSrc: Oral  PainSc: 0-No pain         Complications: No notable events documented.

## 2022-05-27 NOTE — Plan of Care (Signed)
  Problem: Education: Goal: Knowledge of the prescribed therapeutic regimen will improve Outcome: Progressing   Problem: Activity: Goal: Ability to avoid complications of mobility impairment will improve Outcome: Progressing Goal: Range of joint motion will improve Outcome: Progressing   Problem: Clinical Measurements: Goal: Postoperative complications will be avoided or minimized Outcome: Progressing   Problem: Pain Management: Goal: Pain level will decrease with appropriate interventions Outcome: Progressing   

## 2022-05-27 NOTE — H&P (Signed)
ORTHOPAEDIC HISTORY & PHYSICAL Gwenlyn Fudge, [place] - 05/19/2022 2:45 PM EDT Formatting of this note is different from the original. [facility] MEDICINE Chief Complaint:   Chief Complaint  Patient presents with   Knee Pain  H & P LEFT KNEE   History of Present Illness:   Rosangela Fehrenbach is a 87 y.o. female that presents to clinic today for her preoperative history and evaluation. Patient presents unaccompanied. The patient is scheduled to undergo a left total knee arthroplasty on 05/27/22 by Dr. [name]. Her pain began many years ago. The pain is located along the anterior aspect of the knee. She describes her pain as worse with weightbearing, going up and down stairs, and rising after sitting. She reports associated swelling with some giving way of the knee. She denies associated numbness or tingling, denies locking.   The patient's symptoms have progressed to the point that they decrease her quality of life. The patient has previously undergone conservative treatment including NSAIDS and injections to the knee without adequate control of her symptoms.  Patient asks about using SCDs at home after surgery.   Denies history of lumbar surgery, history of DVT. No significant cardiac history.   Past Medical, Surgical, Family, Social History, Allergies, Medications:   Past Medical History:  Past Medical History:  Diagnosis Date   Arthritis  osteoarthritis   Basal cell carcinoma   Glaucoma (increased eye pressure)   Thyroid disease  hypothyroid   Venous disease 06/11/2012  From Clinic Note5/07/2012 She underwent noninvasive venous testing revealing her to have anterior accessory and small saphenous reflux bilaterally. Her great saphenous veins are missing bilaterally due to surgical stripping, and she has reflux in her deep system as well. Overall, given her pigmentation and changes consistent of longstanding chronic venous insufficiency, I would like to  see if she   Past Surgical History:  Past Surgical History:  Procedure Laterality Date   MASTECTOMY SIMPLE Left 06/25/2021  Dr Lesli Albee   BREAST EXCISIONAL BIOPSY   EYE SURGERY Bilateral  cataract   Nasal reconstruction for basal cell carcinoma   Vein Stripping & Ligation   Current Medications:  Current Outpatient Medications  Medication Sig Dispense Refill   alendronate (FOSAMAX) 70 MG tablet Take 70 mg by mouth once a week   ascorbic acid, vitamin C, 500 mg TbER Take 1 tablet by mouth once daily   calcium carbonate 600 mg calcium (1,500 mg) Tab tablet Take 1 tablet by mouth once daily   celecoxib (CELEBREX) 200 MG capsule TAKE 1 CAPSULE BY MOUTH ONCE DAILY 30 capsule 1   cholecalciferol (VITAMIN D3) 2,000 unit capsule Take 1 capsule (2,000 Units total) by mouth once daily as needed   halobetasol (ULTRAVATE) 0.05 % ointment Apply topically once as needed   ipratropium (ATROVENT) 0.06 % nasal spray ipratropium bromide 42 mcg (0.06 %) nasal spray 2 PUFFS EACH NOSTRIL AS NEEDED FOR RUNNY NOSE EVERY 8 HOURS   latanoprost (XALATAN) 0.005 % ophthalmic solution Place 1 drop into both eyes at bedtime   levothyroxine (SYNTHROID) 75 MCG tablet Take 1 tablet (75 mcg total) by mouth once daily   losartan (COZAAR) 100 MG tablet Take 1 tablet (100 mg total) by mouth once daily   multivitamin tablet Take 1 tablet by mouth once daily   roflumilast (ZORYVE) 0.3 % Crea Apply topically once daily as needed   silver sulfADIAZINE (SSD) 1 % cream Apply topically as needed   tacrolimus (PROTOPIC) 0.1 % ointment  Apply topically 2 (two) times daily as needed   traMADoL (ULTRAM) 50 mg tablet tramadol 50 mg tablet TAKE 1 TABLET BY MOUTH EVERY 6 HOURS AS NEEDED.   vitamin E 400 UNIT capsule Take by mouth once daily   No current facility-administered medications for this visit.   Allergies:  Allergies  Allergen Reactions   Naproxen Hives  Had bumps break out on the back   Anastrozole Other (See  Comments)  Back pain   Exemestane Other (See Comments)  Pain in right hand   Social History:  Social History   Socioeconomic History   Marital status: Married  Spouse name: [name]   Number of children: 2   Years of education: 18+   Highest education level: Bachelor's degree (e.g., BA, AB, BS)  Occupational History   Occupation: Retired Community education officer  Tobacco Use   Smoking status: Never   Smokeless tobacco: Never  Vaping Use   Vaping Use: Never used  Substance and Sexual Activity   Alcohol use: Never   Drug use: Never   Sexual activity: Defer  Partners: Male   Family History: History reviewed. No pertinent family history.  Review of Systems:   A 10+ ROS was performed, reviewed, and the pertinent orthopaedic findings are documented in the HPI.   Physical Examination:   BP (!) 150/96 (BP Location: Right upper arm, Patient Position: Sitting, BP Cuff Size: Large Adult)  Ht 167.6 cm ('5\' 6"'$ )  Wt 77.3 kg (170 lb 6.4 oz)  BMI 27.50 kg/m   Patient is a well-developed, well-nourished female in no acute distress. Patient has normal mood and affect. Patient is alert and oriented to person, place, and time.   HEENT: Atraumatic, normocephalic. Pupils equal and reactive to light. Extraocular motion intact. Noninjected sclera.  Cardiovascular: Regular rate and rhythm, with no murmurs, rubs, or gallops. Distal pulses palpable.  Respiratory: Lungs clear to auscultation bilaterally.   Left Knee: Soft tissue swelling: mild Effusion: none Erythema: none Crepitance: moderate Tenderness: lateral, anterior Alignment: relative valgus Mediolateral laxity: lateral pseudolaxity Posterior sag: negative Patellar tracking: Good tracking without evidence of subluxation or tilt. Patellar grind test is positive. Atrophy: No significant atrophy.  Quadriceps tone was fair to good. Range of motion: 0/7/128 degrees   Able to plantarflex and dorsiflex the ankle. Able to flex and extend the  toes.  Sensation intact over the saphenous, lateral sural cutaneous, superficial fibular, and deep fibular nerve distributions.  Tests Performed/Reviewed:  X-rays  Previous x-rays of the left knee were reviewed. Images available loss of lateral compartment joint space with osteophyte formation. No fractures or other osseous abnormality noted. Loss of patellofemoral joint space also noted.0  Impression:   ICD-10-CM  1. Primary osteoarthritis of left knee M17.12   Plan:   The patient has end-stage degenerative changes of the left knee. It was explained to the patient that the condition is progressive in nature. Having failed conservative treatment, the patient has elected to proceed with a total joint arthroplasty. The patient will undergo a total joint arthroplasty with Dr. [name]. The risks of surgery, including blood clot and infection, were discussed with the patient. Measures to reduce these risks, including the use of anticoagulation, perioperative antibiotics, and early ambulation were discussed. The importance of postoperative physical therapy was discussed with the patient. The patient elects to proceed with surgery. The patient is instructed to stop all blood thinners prior to surgery. The patient is instructed to call the hospital the day before surgery to learn of the  proper arrival time.   Contact our office with any questions or concerns. Follow up as indicated, or sooner should any new problems arise, if conditions worsen, or if they are otherwise concerned.   Gwenlyn Fudge, PA-C [facility] [place], [place] [address] Phone: [phone]  This note was generated in part with voice recognition software and I apologize for any typographical errors that were not detected and corrected.  Electronically signed by Gwenlyn Fudge, PA at 05/20/2022 5:30 PM EDT

## 2022-05-27 NOTE — Evaluation (Signed)
Physical Therapy Evaluation Patient Details Name: Beth Rodgers MRN: [id number] DOB: April 15, 1935 Today's Date: 05/27/2022  History of Present Illness  Pt admitted for L TKA. Hx includes arthritis, glaucoma, vascular disease. Pt underwent general anesthesia. Pt is POD 0 at time of eval.  Clinical Impression  Pt is a pleasant 87 year old female who was admitted for L TKA. Pt performs bed mobility with CGA, transfers with min A, and ambulation with CGA and RW. Pt demonstrates deficits with strength/ROM/mobility/pain. Would benefit from skilled PT to address above deficits and promote optimal return to PLOF. Pt demonstrates ability to perform 10 SLRs with independence, therefore does not require KI for mobility. Pt taken off O2 upon arriving to room with sats at 95% with exertion. No SOB symptoms noted. Left off O2 at end of session. Pt very motivated to participate, hopeful for home discharge next date. Pt will need to perform stair training next date in preparation for home d/c. Pt and family educated on polar care system. Good tolerance for WB and ROM on evaluation. Recommend transition to [place] upon discharge from acute hospitalization.     Recommendations for follow up therapy are one component of a multi-disciplinary discharge planning process, led by the attending physician.  Recommendations may be updated based on patient status, additional functional criteria and insurance authorization.  Follow Up Recommendations Home health PT    Assistance Recommended at Discharge Intermittent Supervision/Assistance  Patient can return home with the following  A little help with walking and/or transfers;A little help with bathing/dressing/bathroom;Assistance with cooking/housework;Assist for transportation;Help with stairs or ramp for entrance    Equipment Recommendations None recommended by PT  Recommendations for Other Services       Functional Status Assessment Patient has had a recent decline in  their functional status and demonstrates the ability to make significant improvements in function in a reasonable and predictable amount of time.     Precautions / Restrictions Precautions Precautions: Knee;Fall Precaution Booklet Issued: No Restrictions Weight Bearing Restrictions: Yes LLE Weight Bearing: Weight bearing as tolerated      Mobility  Bed Mobility Overal bed mobility: Needs Assistance Bed Mobility: Supine to Sit     Supine to sit: Min guard     General bed mobility comments: Needs cues to facilitate including hand placement. Able to slide surgical leg OOB without assist. Once seated EOB became nauseated with wet paper towel provided, nursing notified. Appears to improve with seated rest break.    Transfers Overall transfer level: Needs assistance Equipment used: Rolling walker (2 wheels) Transfers: Sit to/from Stand Sit to Stand: Min assist           General transfer comment: Sit-to-stand performed with cues for hand placement. Once standng good weight tolerance on surgical leg.    Ambulation/Gait Ambulation/Gait assistance: Min guard Gait Distance (Feet): 5 Feet Assistive device: Rolling walker (2 wheels) Gait Pattern/deviations: Step-to pattern       General Gait Details: Ambulated to [facility] followed by ambulation to recliner. Pt follows commands well for sequencing of RW and foot placement. Pt limited in amb distance secondary to nausea and fatigue.  Stairs            Wheelchair Mobility    Modified Rankin (Stroke Patients Only)       Balance Overall balance assessment: Needs assistance Sitting-balance support: Feet supported, Bilateral upper extremity supported Sitting balance-Leahy Scale: Good     Standing balance support: Bilateral upper extremity supported Standing balance-Leahy Scale: Good  Pertinent Vitals/Pain Pain Assessment Pain Assessment: 0-10 Pain Score: 6  Pain Location: L  LE Pain Descriptors / Indicators: Operative site guarding, Discomfort Pain Intervention(s): Limited activity within patient's tolerance, Repositioned, RN gave pain meds during session, Ice applied    Home Living Family/patient expects to be discharged to:: Private residence Living Arrangements: Spouse/significant other Available Help at Discharge: Family Type of Home: House Home Access: Stairs to enter Entrance Stairs-Rails: None Entrance Stairs-Number of Steps: 1   Home Layout: One level Home Equipment: Conservation officer, nature (2 wheels);Cane - single point      Prior Function Prior Level of Function : Independent/Modified Independent             Mobility Comments: Independent baseline. Was getting OP PT prior to surgery. Reports no falls.       Hand Dominance        Extremity/Trunk Assessment   Upper Extremity Assessment Upper Extremity Assessment: Overall WFL for tasks assessed    Lower Extremity Assessment Lower Extremity Assessment: Generalized weakness (L LE grossly 3/5; R LE grossly 4+/5)       Communication   Communication: No difficulties  Cognition Arousal/Alertness: Awake/alert Behavior During Therapy: WFL for tasks assessed/performed Overall Cognitive Status: Within Functional Limits for tasks assessed                                 General Comments: Drowsy during session secondary to anesthesia.        General Comments      Exercises Total Joint Exercises Goniometric ROM: L LE Knee 8-83 Other Exercises Other Exercises: Supine/seated ther-ex including SLR, Quad Sets, LAQ. 10 reps ea with CGA Other Exercises: Pt amb to BSC to void. Pt able to perform self-hygiene with supervision. RW used for transfers on and off BSC. Several instances of nausea throughout session.   Assessment/Plan    PT Assessment Patient needs continued PT services  PT Problem List Decreased strength;Decreased range of motion;Decreased mobility;Decreased knowledge  of use of DME;Pain;Decreased knowledge of precautions       PT Treatment Interventions DME instruction;Gait training;Stair training;Therapeutic activities;Therapeutic exercise;Patient/family education    PT Goals (Current goals can be found in the Care Plan section)  Acute Rehab PT Goals Patient Stated Goal: To go home tomorrow PT Goal Formulation: With patient Time For Goal Achievement: 06/10/22 Potential to Achieve Goals: Good    Frequency BID     Co-evaluation               AM-PAC PT "6 Clicks" Mobility  Outcome Measure Help needed turning from your back to your side while in a flat bed without using bedrails?: None Help needed moving from lying on your back to sitting on the side of a flat bed without using bedrails?: A Little Help needed moving to and from a bed to a chair (including a wheelchair)?: A Little Help needed standing up from a chair using your arms (e.g., wheelchair or bedside chair)?: A Little Help needed to walk in hospital room?: A Little Help needed climbing 3-5 steps with a railing? : A Lot 6 Click Score: 18    End of Session Equipment Utilized During Treatment: Gait belt Activity Tolerance: Patient tolerated treatment well Patient left: in chair;with chair alarm set;with family/visitor present Nurse Communication: Mobility status PT Visit Diagnosis: Muscle weakness (generalized) (M62.81);Difficulty in walking, not elsewhere classified (R26.2);Pain Pain - Right/Left: Left Pain - part of body: Knee  Time: 4445-8483 PT Time Calculation (min) (ACUTE ONLY): 41 min   Charges:   PT Evaluation $PT Eval Low Complexity: 1 Low PT Treatments $Therapeutic Exercise: 8-22 mins $Therapeutic Activity: 8-22 mins        Greggory Stallion, PT, DPT, GCS [phone]   Sylwia Cuervo 05/27/2022, 4:29 PM

## 2022-05-27 NOTE — OR Nursing (Signed)
Vaseline gauze rubbed on patients right lower anterior extremity by Dr. [name] per patient request.  Ladell Heads, RN

## 2022-05-27 NOTE — Anesthesia Postprocedure Evaluation (Signed)
Anesthesia Post Note  Patient: Beth Rodgers  Procedure(s) Performed: COMPUTER ASSISTED TOTAL KNEE ARTHROPLASTY (Left: Knee)  Patient location during evaluation: PACU Anesthesia Type: General Level of consciousness: awake and alert Pain management: pain level controlled Vital Signs Assessment: post-procedure vital signs reviewed and stable Respiratory status: spontaneous breathing, nonlabored ventilation, respiratory function stable and patient connected to nasal cannula oxygen Cardiovascular status: blood pressure returned to baseline and stable Postop Assessment: no apparent nausea or vomiting Anesthetic complications: no   No notable events documented.   Last Vitals:  Vitals:   05/27/22 1322 05/27/22 1345  BP: 106/61 107/65  Pulse: 78 72  Resp: 14   Temp: 36.4 C 36.6 C  SpO2: 97% 97%    Last Pain:  Vitals:   05/27/22 1322  TempSrc:   PainSc: 4                  Beth Rodgers Flavio Lindroth

## 2022-05-27 NOTE — Anesthesia Preprocedure Evaluation (Signed)
Anesthesia Evaluation  Patient identified by MRN, date of birth, ID band Patient awake    Reviewed: Allergy & Precautions, NPO status , Patient's Chart, lab work & pertinent test results  History of Anesthesia Complications Negative for: history of anesthetic complications  Airway Mallampati: III  TM Distance: <3 FB Neck ROM: full    Dental  (+) Chipped, Poor Dentition, Missing   Pulmonary neg pulmonary ROS, neg shortness of breath,    Pulmonary exam normal        Cardiovascular Exercise Tolerance: Good hypertension, (-) CAD Normal cardiovascular exam     Neuro/Psych PSYCHIATRIC DISORDERS negative neurological ROS     GI/Hepatic Neg liver ROS, GERD  Controlled,  Endo/Other  Hypothyroidism   Renal/GU      Musculoskeletal   Abdominal   Peds  Hematology negative hematology ROS (+)   Anesthesia Other Findings Past Medical History: No date: Basal cell carcinoma (BCC)     Comment:  txted with MOHs in past 08/10/2018: Cellulitis No date: Chronic venous insufficiency     Comment:  with varicose veins No date: Family history of breast cancer No date: Family history of thyroid cancer No date: GERD (gastroesophageal reflux disease) No date: Glaucoma No date: History  of basal cell carcinoma     Comment:  left nare/BREAST CANCER No date: History of chicken pox No date: Hypertension No date: Hypoglycemia No date: Hypothyroid No date: Lymphedema     Comment:  LE No date: Osteoarthritis     Comment:  back pain, L knee pain 06/2009: Osteopenia     Comment:  DEXA 11/2015: T -2.3 hip, -2.2 spine, on longterm               bisphosphonate/evista No date: Psoriasis 04/16/2016: Pyogenic granuloma  Past Surgical History: No date: BASAL CELL CARCINOMA EXCISION     Comment:  located on face No date: BREAST BIOPSY; Right     Comment:  core done ing [place]? 08/16/2020: BREAST BIOPSY; Left     Comment:  3 area bx 4:00  X, IMC, 6:00Q IMC, LN hydro #3-benign 09/11/2020: BREAST LUMPECTOMY; Left     Comment:  two areas of [place] No date: CATARACT EXTRACTION; Bilateral     Comment:  bilateral 06/2009: DEXA     Comment:  T score Spine: -1.8, hip -2.0 09/11/2020: EXCISION OF BREAST BIOPSY; Left     Comment:  Procedure: EXCISION OF BREAST BIOPSY;  Surgeon:               [name], MD;  Location: [facility] ORS;  Service:              General;  Laterality: Left; 09/2015: Foam sclerotherapy; Bilateral     Comment:  [place]ed [place], [place] 06/25/2021: MASTECTOMY W/ SENTINEL NODE BIOPSY; Left     Comment:  Procedure: MASTECTOMY WITH SENTINEL LYMPH NODE BIOPSY;                Surgeon: [name], MD;  Location: [facility] ORS;               Service: General;  Laterality: Left; 2005: NASAL RECONSTRUCTION     Comment:  for [place] L nare s/p Mohs 2014: nuclear stress test     Comment:  normal in [place] 09/11/2020: PARTIAL MASTECTOMY WITH NEEDLE LOCALIZATION AND AXILLARY  SENTINEL LYMPH NODE BX; Left     Comment:  Procedure: PARTIAL MASTECTOMY WITH NEEDLE LOCALIZATION  AND AXILLARY SENTINEL LYMPH NODE BX;  Surgeon:               [name], MD;  Location: [facility] ORS;  Service:              General;  Laterality: Left; 01/2012: RADIOFREQUENCY ABLATION; Left     Comment:  remnant L G saphenous vein below knee 02/2013: RADIOFREQUENCY ABLATION; Right     Comment:  RLE vein ablation  remote: VEIN LIGATION AND STRIPPING     Comment:  bilateral G saphenous veins  BMI    Body Mass Index: 27.44 kg/m      Reproductive/Obstetrics negative OB ROS                             Anesthesia Physical Anesthesia Plan  ASA: 3  Anesthesia Plan: General ETT   Post-op Pain Management:    Induction: Intravenous  PONV Risk Score and Plan: Ondansetron, Dexamethasone, Midazolam and Treatment may vary due to age or medical condition  Airway Management Planned: Oral  ETT  Additional Equipment:   Intra-op Plan:   Post-operative Plan: Extubation in OR  Informed Consent: I have reviewed the patients History and Physical, chart, labs and discussed the procedure including the risks, benefits and alternatives for the proposed anesthesia with the patient or authorized representative who has indicated his/her understanding and acceptance.     Dental Advisory Given  Plan Discussed with: Anesthesiologist, CRNA and Surgeon  Anesthesia Plan Comments: (Thorough discussion with patient regarding spinal vs GA.  Patient declines spinal.  Patient consented for risks of anesthesia including but not limited to:  - adverse reactions to medications - damage to eyes, teeth, lips or other oral mucosa - nerve damage due to positioning  - sore throat or hoarseness - Damage to heart, brain, nerves, lungs, other parts of body or loss of life  Patient voiced understanding.)        Anesthesia Quick Evaluation

## 2022-05-27 NOTE — H&P (Signed)
The patient has been re-examined, and the chart reviewed, and there have been no interval changes to the documented history and physical.    The risks, benefits, and alternatives have been discussed at length. The patient expressed understanding of the risks benefits and agreed with plans for surgical intervention.  Bernie Ransford P. Jedd Schulenburg, Jr. M.D.    

## 2022-05-28 DIAGNOSIS — Z79899 Other long term (current) drug therapy: Secondary | ICD-10-CM | POA: Diagnosis not present

## 2022-05-28 DIAGNOSIS — Z85828 Personal history of other malignant neoplasm of skin: Secondary | ICD-10-CM | POA: Diagnosis not present

## 2022-05-28 DIAGNOSIS — E039 Hypothyroidism, unspecified: Secondary | ICD-10-CM | POA: Diagnosis present

## 2022-05-28 DIAGNOSIS — Z79890 Hormone replacement therapy (postmenopausal): Secondary | ICD-10-CM | POA: Diagnosis not present

## 2022-05-28 DIAGNOSIS — Z808 Family history of malignant neoplasm of other organs or systems: Secondary | ICD-10-CM | POA: Diagnosis not present

## 2022-05-28 DIAGNOSIS — Z01810 Encounter for preprocedural cardiovascular examination: Secondary | ICD-10-CM | POA: Diagnosis not present

## 2022-05-28 DIAGNOSIS — Z8249 Family history of ischemic heart disease and other diseases of the circulatory system: Secondary | ICD-10-CM | POA: Diagnosis not present

## 2022-05-28 DIAGNOSIS — I10 Essential (primary) hypertension: Secondary | ICD-10-CM | POA: Diagnosis present

## 2022-05-28 DIAGNOSIS — Z803 Family history of malignant neoplasm of breast: Secondary | ICD-10-CM | POA: Diagnosis not present

## 2022-05-28 DIAGNOSIS — I4891 Unspecified atrial fibrillation: Secondary | ICD-10-CM | POA: Diagnosis present

## 2022-05-28 DIAGNOSIS — E785 Hyperlipidemia, unspecified: Secondary | ICD-10-CM | POA: Diagnosis present

## 2022-05-28 DIAGNOSIS — Z7983 Long term (current) use of bisphosphonates: Secondary | ICD-10-CM | POA: Diagnosis not present

## 2022-05-28 DIAGNOSIS — M1712 Unilateral primary osteoarthritis, left knee: Secondary | ICD-10-CM | POA: Diagnosis present

## 2022-05-28 DIAGNOSIS — Z833 Family history of diabetes mellitus: Secondary | ICD-10-CM | POA: Diagnosis not present

## 2022-05-28 DIAGNOSIS — E559 Vitamin D deficiency, unspecified: Secondary | ICD-10-CM | POA: Diagnosis present

## 2022-05-28 DIAGNOSIS — K219 Gastro-esophageal reflux disease without esophagitis: Secondary | ICD-10-CM | POA: Diagnosis present

## 2022-05-28 DIAGNOSIS — Z853 Personal history of malignant neoplasm of breast: Secondary | ICD-10-CM | POA: Diagnosis not present

## 2022-05-28 DIAGNOSIS — I482 Chronic atrial fibrillation, unspecified: Secondary | ICD-10-CM | POA: Diagnosis not present

## 2022-05-28 DIAGNOSIS — Z170 Estrogen receptor positive status [ER+]: Secondary | ICD-10-CM | POA: Diagnosis not present

## 2022-05-28 DIAGNOSIS — Z823 Family history of stroke: Secondary | ICD-10-CM | POA: Diagnosis not present

## 2022-05-28 DIAGNOSIS — F419 Anxiety disorder, unspecified: Secondary | ICD-10-CM | POA: Diagnosis present

## 2022-05-28 DIAGNOSIS — Z96652 Presence of left artificial knee joint: Secondary | ICD-10-CM | POA: Diagnosis not present

## 2022-05-28 MED ORDER — TRAMADOL HCL 50 MG PO TABS
50.0000 mg | ORAL_TABLET | ORAL | 0 refills | Status: DC | PRN
Start: 2022-05-28 — End: 2022-07-22

## 2022-05-28 MED ORDER — ENOXAPARIN SODIUM 40 MG/0.4ML IJ SOSY: 40.0000 mg | PREFILLED_SYRINGE | INTRAMUSCULAR | 0 refills | Status: DC

## 2022-05-28 MED ORDER — OXYCODONE HCL 5 MG PO TABS
5.0000 mg | ORAL_TABLET | ORAL | 0 refills | Status: DC | PRN
Start: 2022-05-28 — End: 2022-06-01

## 2022-05-28 MED ORDER — CELECOXIB 200 MG PO CAPS: 200.0000 mg | ORAL_CAPSULE | Freq: Two times a day (BID) | ORAL | 0 refills | Status: DC

## 2022-05-28 NOTE — Telephone Encounter (Signed)
Spoke with pt relaying Dr. Synthia Innocent message.  States she had surgery today and is doing well.  Says they did not wrap her leg but instead placed a stocking that she can. Pt expresses her thanks for the call.

## 2022-05-28 NOTE — Progress Notes (Signed)
Met with the patient in the room at the bedside The patient lives at Home with her husband The patient  currently has DME including rolling walker and single point cane The patient will need a 3 in 1 to be delivered by [employer] to the bedside They have transportation with her husband They can afford their medication  They are set up with [address] for Home health services   Admitted for: Total knee replacement

## 2022-05-28 NOTE — Discharge Summary (Incomplete Revision)
Physician Discharge Summary  Patient ID: Beth Rodgers MRN: [id number] DOB/AGE: 1935-02-09 87 y.o.  Admit date: 05/27/2022 Discharge date: 05/30/2022  Admission Diagnoses:  Total knee replacement status [Z96.659]  Surgeries:Procedure(s):  Left total knee arthroplasty using computer-assisted navigation   SURGEON:  Marciano Sequin. M.D.   ASSISTANT: Cassell Smiles, PA-C (present and scrubbed throughout the case, critical for assistance with exposure, retraction, instrumentation, and closure)   ANESTHESIA: general   ESTIMATED BLOOD LOSS: 50 mL   FLUIDS REPLACED: 1600 mL of crystalloid   TOURNIQUET TIME: 78 minutes   DRAINS: 2 medium Hemovac drains   SOFT TISSUE RELEASES: Anterior cruciate ligament, posterior cruciate ligament, deep  medial collateral ligament, patellofemoral ligament   IMPLANTS UTILIZED: DePuy Attune size 6N posterior stabilized femoral component (cemented), size 5 rotating platform tibial component (cemented), 32 mm medialized dome patella (cemented), and a 7 mm stabilized rotating platform polyethylene insert.  Discharge Diagnoses: Patient Active Problem List   Diagnosis Date Noted   Total knee replacement status 05/27/2022   Venous ulcer of ankle, unspecified laterality ([place]) 12/24/2021   Bilateral lower extremity edema 12/17/2021   Pre-op evaluation 06/02/2021   Right wrist pain 02/28/2021   Skin rash 02/28/2021   Ascending aorta dilatation ([place]) 01/10/2021   Left-sided chest pain 01/10/2021   Genetic testing 10/30/2020   Malignant neoplasm of overlapping sites of left breast in female, estrogen receptor positive ([place]) 10/22/2020   Family history of breast cancer    Family history of thyroid cancer    Insertional Achilles tendinopathy 08/28/2020   Breast cancer, left breast ([place]) 08/01/2020   Right carotid bruit 06/05/2020   Chest pressure 06/03/2020   Left Achilles tendinitis 09/01/2019   Low back pain 08/29/2019   Lumbosacral spondylosis without  myelopathy 08/29/2019   Calcaneal spur 08/17/2019   Contusion of knee 10/04/2018   Varicose veins of bilateral lower extremities with pain 03/30/2018   Lymphedema 03/30/2018   Dizziness 02/02/2017   Situational depression 12/18/2016   Vitamin D deficiency 10/11/2016   Fatigue 08/12/2016   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 04/14/2016   Essential hypertension 12/17/2015   Dermatitis of external ear 12/17/2015   Corn of foot 05/16/2015   Advanced care planning/counseling discussion 10/30/2014   Neck pain on right side 06/25/2014   Primary osteoarthritis of left knee 08/17/2013   Oropharyngeal dysphagia 11/15/2012   Stressful life events affecting family and household 03/30/2012   Medicare annual wellness visit, subsequent 03/30/2012   History of basal cell carcinoma    Osteoarthritis    Osteopenia    Hypothyroidism    Glaucoma    Chronic venous insufficiency     Past Medical History:  Diagnosis Date   Basal cell carcinoma (BCC)    txted with MOHs in past   Cellulitis 08/10/2018   Chronic venous insufficiency    with varicose veins   Family history of breast cancer    Family history of thyroid cancer    GERD (gastroesophageal reflux disease)    Glaucoma    History  of basal cell carcinoma    left nare/BREAST CANCER   History of chicken pox    Hypertension    Hypoglycemia    Hypothyroid    Lymphedema    LE   Osteoarthritis    back pain, L knee pain   Osteopenia 06/2009   DEXA 11/2015: T -2.3 hip, -2.2 spine, on longterm bisphosphonate/evista   Psoriasis    Pyogenic granuloma 04/16/2016  Transfusion:    Consultants (if any): Treatment Team:  [facility], [facility] Team 3, MD Vanna [place], [place]  Discharged Condition: Improved  Hospital Course: Beth Rodgers is an 87 y.o. female who was admitted 05/27/2022 with a diagnosis of left knee osteoarthritis and went to the operating room on 05/27/2022 and underwent left total knee  arthoplasty. The patient received perioperative antibiotics for prophylaxis (see below). The patient tolerated the procedure well and was transported to PACU in stable condition. After meeting PACU criteria, the patient was subsequently transferred to the Orthopaedics/Rehabilitation unit.   The patient received DVT prophylaxis in the form of early mobilization, Lovenox, TED hose, and SCDs . A sacral pad had been placed and heels were elevated off of the bed with rolled towels in order to protect skin integrity. Foley catheter was discontinued on postoperative day #0. Wound drains were discontinued on postoperative day #1. The surgical incision was healing well without signs of infection.  Physical therapy was initiated postoperatively for transfers, gait training, and strengthening. Occupational therapy was initiated for activities of daily living and evaluation for assisted devices. Rehabilitation goals were reviewed in detail with the patient. The patient made steady progress with physical therapy and physical therapy recommended discharge to Home.   Patient did have sustained tachycardia POD#2. EKG showed A-fib with RVR, and a consult was placed to the hospitalist on 05/29/22.  The hospitalist team obtained an ECHO and started the patient on Eliquis '5mg'$  twice daily.  ***  The patient achieved the preliminary goals of this hospitalization and was felt to be medically and orthopaedically appropriate for discharge.  She was given perioperative antibiotics:  Anti-infectives (From admission, onward)    Start     Dose/Rate Route Frequency Ordered Stop   05/27/22 1430  ceFAZolin (ANCEF) IVPB 2g/100 mL premix        2 g 200 mL/hr over 30 Minutes Intravenous Every 6 hours 05/27/22 1359 05/27/22 2204   05/27/22 0619  ceFAZolin (ANCEF) 2-4 GM/100ML-% IVPB       Note to Pharmacy: Olena Mater F: cabinet override      05/27/22 0619 05/27/22 0814   05/27/22 0600  ceFAZolin (ANCEF) IVPB 2g/100 mL premix         2 g 200 mL/hr over 30 Minutes Intravenous On call to O.R. 05/27/22 0040 05/27/22 0755     .  Recent vital signs:  Vitals:   05/29/22 2045 05/30/22 0545  BP: 117/65 (!) 114/54  Pulse: 84 65  Resp: 17 17  Temp: (!) 97.5 F (36.4 C) 97.7 F (36.5 C)  SpO2: 98% 97%    Recent laboratory studies:  No results for input(s): WBC, HGB, HCT, PLT, K, CL, CO2, BUN, CREATININE, GLUCOSE, CALCIUM, LABPT, INR in the last 72 hours.  Diagnostic Studies: DG Knee Left Port  Result Date: 05/27/2022 CLINICAL DATA:  87 year old female status post left knee replacement. EXAM: PORTABLE LEFT KNEE - 1-2 VIEW COMPARISON:  06/02/2021. FINDINGS: AP and cross-table lateral views at 1119 hours. Left total knee arthroplasty cemented hardware in place, appears intact and aligned. Suprapatellar surgical drains in place. Anterior skin staples. No unexpected osseous changes identified. IMPRESSION: Left total knee arthroplasty with no adverse features. Electronically Signed   By: Genevie Ann M.D.   On: 05/27/2022 11:34   VAS [place] LOWER EXTREMITY VENOUS REFLUX  Result Date: 05/11/2022  Lower Venous Reflux Study Patient Name:  SHEKINAH PITONES  Date of Exam:   05/07/2022 Medical Rec #: [id number]  Accession #:    [id number] Date of Birth: 29-Aug-1935      Patient Gender: F Patient Age:   15 years Exam Location:  [facility] Procedure:      VAS [place] LOWER EXTREMITY VENOUS REFLUX Referring Phys: [facility] --------------------------------------------------------------------------------  Indications: Swelling, and varicosities.  Performing Technologist: Almira Coaster RVS  Examination Guidelines: A complete evaluation includes B-mode imaging, spectral Doppler, color Doppler, and power Doppler as needed of all accessible portions of each vessel. Bilateral testing is considered an integral part of a complete examination. Limited examinations for reoccurring indications may be performed as noted. The reflux portion of the  exam is performed with the patient in reverse Trendelenburg. Significant venous reflux is defined as >500 ms in the superficial venous system, and >1 second in the deep venous system.  Venous Reflux Times +--------------+--------+------+----------+------------+-----------------------+ RIGHT         Reflux  Reflux  Reflux  Diameter cmsComments                              No       Yes     Time                                       +--------------+--------+------+----------+------------+-----------------------+ CFV           no                                                          +--------------+--------+------+----------+------------+-----------------------+ FV prox       no                                                          +--------------+--------+------+----------+------------+-----------------------+ FV mid        no                                                          +--------------+--------+------+----------+------------+-----------------------+ FV dist       no                                                          +--------------+--------+------+----------+------------+-----------------------+ Popliteal     no                                                          +--------------+--------+------+----------+------------+-----------------------+ GSV at [facility]    no                                                          +--------------+--------+------+----------+------------+-----------------------+  GSV prox thigh                                    prior                                                                     ablation/stripping      +--------------+--------+------+----------+------------+-----------------------+ GSV mid thigh                                     prior                                                                     ablation/stripping       +--------------+--------+------+----------+------------+-----------------------+ GSV dist thigh                                    prior                                                                     ablation/stripping      +--------------+--------+------+----------+------------+-----------------------+ GSV at knee   no                          .35                             +--------------+--------+------+----------+------------+-----------------------+ GSV prox calf no                          .31                             +--------------+--------+------+----------+------------+-----------------------+  +--------------+--------+------+----------+------------+-----------------------+ LEFT          Reflux  Reflux  Reflux  Diameter cmsComments                              No       Yes     Time                                       +--------------+--------+------+----------+------------+-----------------------+ CFV           no                                                          +--------------+--------+------+----------+------------+-----------------------+  FV prox       no                                                          +--------------+--------+------+----------+------------+-----------------------+ FV mid        no                                                          +--------------+--------+------+----------+------------+-----------------------+ FV dist       no                                                          +--------------+--------+------+----------+------------+-----------------------+ Popliteal     no                                                          +--------------+--------+------+----------+------------+-----------------------+ GSV at [facility]                                        prior                                                                     ablation/stripping       +--------------+--------+------+----------+------------+-----------------------+ GSV prox thigh                                    prior                                                                     ablation/stripping      +--------------+--------+------+----------+------------+-----------------------+ GSV mid thigh                                     prior  ablation/stripping      +--------------+--------+------+----------+------------+-----------------------+ GSV dist thigh                            .49                             +--------------+--------+------+----------+------------+-----------------------+ GSV at knee                               .49                             +--------------+--------+------+----------+------------+-----------------------+ GSV prox calf                             .36                             +--------------+--------+------+----------+------------+-----------------------+   Summary: Bilateral: - No evidence of deep vein thrombosis seen in the lower extremities, bilaterally, from the common femoral through the popliteal veins. - No evidence of superficial venous thrombosis in the lower extremities, bilaterally. - No evidence of deep venous insufficiency seen bilaterally in the lower extremity. - No evidence of superficial venous reflux seen in the greater saphenous veins bilaterally.  *See table(s) above for measurements and observations. Electronically signed by Hortencia Pilar MD on 05/11/2022 at 4:44:02 PM.    Final     Discharge Medications:   Allergies as of 05/30/2022       Reactions   Anastrozole Other (See Comments), Itching, Rash   Back pain  Back pain  Back pain    Exemestane Rash   Pain in right hand  Pain in right hand  Pain in right hand    Naproxen Hives   Had bumps break out on the back  Had bumps break out on the back  Had  bumps break out on the back  Had bumps break out on the back  Had bumps break out on the back      Med Rec must be completed prior to using [facility]***        Durable Medical Equipment  (From admission, onward)           Start     Ordered   05/27/22 1400  DME Walker rolling  Once       Question:  Patient needs a walker to treat with the following condition  Answer:  Total knee replacement status   05/27/22 1359   05/27/22 1400  DME Bedside commode  Once       Question:  Patient needs a bedside commode to treat with the following condition  Answer:  Total knee replacement status   05/27/22 1359            Disposition: Home with home health PT     Follow-up Information     Fausto Skillern, PA-C Follow up on 06/11/2022.   Specialty: Orthopedic Surgery Why: at 1:45pm Contact information: [place] [address] [phone]         Dereck Leep, MD Follow up on 07/09/2022.   Specialty: Orthopedic Surgery Why: at 2:45pm Contact information: [place]  [place] [address] [phone]                  Raquel James, PA-C, CAQ-OS 05/30/2022, 9:26 AM

## 2022-05-28 NOTE — Progress Notes (Cosign Needed)
Patient is not able to walk the distance required to go the bathroom, or he/she is unable to safely negotiate stairs required to access the bathroom.  A 3in1 BSC will alleviate this problem  

## 2022-05-28 NOTE — Progress Notes (Signed)
Physical Therapy Treatment Patient Details Name: Beth Rodgers MRN: [id number] DOB: Sep 01, 1935 Today's Date: 05/28/2022   History of Present Illness Pt admitted for L TKA. Hx includes arthritis, glaucoma, vascular disease. Pt underwent general anesthesia. Pt is POD 1 at time of treatment.    PT Comments    Pt unaccompanied during today's session and progressing toward goals for discharge to home health. Pt educated and provided handout on HEP with demonstration of each exercise in supine with minimal cueing needed. Pt had most difficulty with heel slides due to discomfort. Pt ambulated 95 ft with RW and CGA. Pt appeared anxious upon arrival at steps. Pt able to ascend steps with CGA but stated "I'm going to pass out" when at the top of the steps. Pt descended steps with CGA (second assist provided for safety due to pt appearing anxious). It appeared pts symptoms were due to feeling anxious, and symptoms resolved upon sitting with legs elevated in recliner. BP taken upon sitting at 124/64. Pt reported L knee pain at 2/10 NPS at start and end of session with mild increase in-session. Will continue to progress ambulation distance and complete seated TKA HEP this afternoon.   Recommendations for follow up therapy are one component of a multi-disciplinary discharge planning process, led by the attending physician.  Recommendations may be updated based on patient status, additional functional criteria and insurance authorization.  Follow Up Recommendations  Home health PT     Assistance Recommended at Discharge Intermittent Supervision/Assistance  Patient can return home with the following A little help with walking and/or transfers;A little help with bathing/dressing/bathroom;Assistance with cooking/housework;Assist for transportation;Help with stairs or ramp for entrance   Equipment Recommendations  BSC/3in1    Recommendations for Other Services       Precautions / Restrictions  Precautions Precautions: Knee;Fall Precaution Booklet Issued: Yes (comment)  Restrictions Weight Bearing Restrictions: Yes LLE Weight Bearing: Weight bearing as tolerated     Mobility  Bed Mobility Overal bed mobility: Needs Assistance Bed Mobility: Supine to Sit     Supine to sit: Supervision     General bed mobility comments: Pt able to slide surgical leg OOB without assist and sit up EOB with supervision. Bed flat.    Transfers Overall transfer level: Needs assistance Equipment used: Rolling walker (2 wheels) Transfers: Sit to/from Stand Sit to Stand: Min guard, Min assist           General transfer comment: Cues for hand placement. Once standng good weight tolerance on surgical leg. Pt able to complete transfer from bed with min A and chair with CGA.    Ambulation/Gait Ambulation/Gait assistance: Min guard Gait Distance (Feet): 95 Feet Assistive device: Rolling walker (2 wheels) Gait Pattern/deviations: Decreased stance time - left, Trunk flexed, Antalgic, Step-to pattern       General Gait Details: Verbal cues needed for maintaining upright posture. Step to progressing to step through gait pattern.   Stairs Stairs: Yes Stairs assistance: Min guard Stair Management: Two rails, Step to pattern, Forwards, With walker Number of Stairs: 4 General stair comments: Ascended 3 steps with bilat rails and 1 step with RW. Descended 4 steps with bilat rails. Verbal cues for sequencing. Pt educated on proper use of RW with ascending steps.   Wheelchair Mobility    Modified Rankin (Stroke Patients Only)       Balance Overall balance assessment: Needs assistance Sitting-balance support: No upper extremity supported, Feet supported Sitting balance-Leahy Scale: Good     Standing balance support:  Bilateral upper extremity supported Standing balance-Leahy Scale: Good Standing balance comment: Use of RW in standing                            Cognition  Arousal/Alertness: Awake/alert Behavior During Therapy: WFL for tasks assessed/performed (except appeared anxious with stairs) Overall Cognitive Status: Within Functional Limits for tasks assessed                                          Exercises Total Joint Exercises Goniometric ROM: L LE Knee 7-90 Other Exercises Other Exercises: Supine/seated ther-ex including AP bilat LE; SLR, Quad Sets, and SAQ L LE all 10 reps with SBA. Heel Slides L LE with min A support under L heel x10 reps    General Comments        Pertinent Vitals/Pain Pain Assessment Pain Assessment: 0-10 Pain Score: 2  Pain Location: L LE Pain Descriptors / Indicators: Operative site guarding, Discomfort Pain Intervention(s): Monitored during session, Repositioned, Ice applied    Home Living                          Prior Function            PT Goals (current goals can now be found in the care plan section) Acute Rehab PT Goals Patient Stated Goal: To go home today PT Goal Formulation: With patient Time For Goal Achievement: 06/10/22 Potential to Achieve Goals: Good Progress towards PT goals: Progressing toward goals    Frequency    BID      PT Plan Current plan remains appropriate    Co-evaluation              AM-PAC PT "6 Clicks" Mobility   Outcome Measure  Help needed turning from your back to your side while in a flat bed without using bedrails?: None Help needed moving from lying on your back to sitting on the side of a flat bed without using bedrails?: None Help needed moving to and from a bed to a chair (including a wheelchair)?: A Little Help needed standing up from a chair using your arms (e.g., wheelchair or bedside chair)?: A Little Help needed to walk in hospital room?: A Little Help needed climbing 3-5 steps with a railing? : A Little (+2 assist present due to pt appearing anxious) 6 Click Score: 20    End of Session Equipment Utilized During  Treatment: Gait belt Activity Tolerance: Patient tolerated treatment well Patient left: in chair;with chair alarm set;with SCD's reapplied;Other (comment) (polar care in place, bilateral heels floating due to towel rolls) Nurse Communication: Mobility status PT Visit Diagnosis: Muscle weakness (generalized) (M62.81);Difficulty in walking, not elsewhere classified (R26.2);Pain Pain - Right/Left: Left Pain - part of body: Knee     Time: 0907-1008 PT Time Calculation (min) (ACUTE ONLY): 61 min  Charges:  $Gait Training: 23-37 mins $Therapeutic Exercise: 8-22 mins $Therapeutic Activity: 8-22 mins                     Beth Rodgers, SPT    Sitara Cashwell 05/28/2022, 11:41 AM

## 2022-05-28 NOTE — Plan of Care (Signed)
  Problem: Education: Goal: Knowledge of the prescribed therapeutic regimen will improve Outcome: Progressing   Problem: Activity: Goal: Ability to avoid complications of mobility impairment will improve Outcome: Progressing Goal: Range of joint motion will improve Outcome: Progressing   Problem: Clinical Measurements: Goal: Postoperative complications will be avoided or minimized Outcome: Progressing   Problem: Pain Management: Goal: Pain level will decrease with appropriate interventions Outcome: Progressing   

## 2022-05-28 NOTE — Discharge Summary (Addendum)
Physician Discharge Summary  Patient ID: Beth Rodgers MRN: [id number] DOB/AGE: 09/17/1935 87 y.o.  Admit date: 05/27/2022 Discharge date: 06/01/2022  Admission Diagnoses:  Total knee replacement status [Z96.659]  Surgeries:Procedure(s):  Left total knee arthroplasty using computer-assisted navigation   SURGEON:  Marciano Sequin. M.D.   ASSISTANT: Cassell Smiles, PA-C (present and scrubbed throughout the case, critical for assistance with exposure, retraction, instrumentation, and closure)   ANESTHESIA: general   ESTIMATED BLOOD LOSS: 50 mL   FLUIDS REPLACED: 1600 mL of crystalloid   TOURNIQUET TIME: 78 minutes   DRAINS: 2 medium Hemovac drains   SOFT TISSUE RELEASES: Anterior cruciate ligament, posterior cruciate ligament, deep  medial collateral ligament, patellofemoral ligament   IMPLANTS UTILIZED: DePuy Attune size 6N posterior stabilized femoral component (cemented), size 5 rotating platform tibial component (cemented), 32 mm medialized dome patella (cemented), and a 7 mm stabilized rotating platform polyethylene insert.  Discharge Diagnoses: Patient Active Problem List   Diagnosis Date Noted   Total knee replacement status 05/27/2022   Venous ulcer of ankle, unspecified laterality ([place]) 12/24/2021   Bilateral lower extremity edema 12/17/2021   Pre-op evaluation 06/02/2021   Right wrist pain 02/28/2021   Skin rash 02/28/2021   Ascending aorta dilatation ([place]) 01/10/2021   Left-sided chest pain 01/10/2021   Genetic testing 10/30/2020   Malignant neoplasm of overlapping sites of left breast in female, estrogen receptor positive ([place]) 10/22/2020   Family history of breast cancer    Family history of thyroid cancer    Insertional Achilles tendinopathy 08/28/2020   Breast cancer, left breast ([place]) 08/01/2020   Right carotid bruit 06/05/2020   Chest pressure 06/03/2020   Left Achilles tendinitis 09/01/2019   Low back pain 08/29/2019   Lumbosacral spondylosis without  myelopathy 08/29/2019   Calcaneal spur 08/17/2019   Contusion of knee 10/04/2018   Varicose veins of bilateral lower extremities with pain 03/30/2018   Lymphedema 03/30/2018   Dizziness 02/02/2017   Situational depression 12/18/2016   Vitamin D deficiency 10/11/2016   Fatigue 08/12/2016   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 04/14/2016   Essential hypertension 12/17/2015   Dermatitis of external ear 12/17/2015   Corn of foot 05/16/2015   Advanced care planning/counseling discussion 10/30/2014   Neck pain on right side 06/25/2014   Primary osteoarthritis of left knee 08/17/2013   Oropharyngeal dysphagia 11/15/2012   Stressful life events affecting family and household 03/30/2012   Medicare annual wellness visit, subsequent 03/30/2012   History of basal cell carcinoma    Osteoarthritis    Osteopenia    Hypothyroidism    Glaucoma    Chronic venous insufficiency     Past Medical History:  Diagnosis Date   Basal cell carcinoma (BCC)    txted with MOHs in past   Cellulitis 08/10/2018   Chronic venous insufficiency    with varicose veins   Family history of breast cancer    Family history of thyroid cancer    GERD (gastroesophageal reflux disease)    Glaucoma    History  of basal cell carcinoma    left nare/BREAST CANCER   History of chicken pox    Hypertension    Hypoglycemia    Hypothyroid    Lymphedema    LE   Osteoarthritis    back pain, L knee pain   Osteopenia 06/2009   DEXA 11/2015: T -2.3 hip, -2.2 spine, on longterm bisphosphonate/evista   Psoriasis    Pyogenic granuloma 04/16/2016  Transfusion:    Consultants (if any): Treatment Team:  [facility], [facility] Team 3, MD Vanna [place], [place]  Discharged Condition: Improved  Hospital Course: Beth Rodgers is an 87 y.o. female who was admitted 05/27/2022 with a diagnosis of left knee osteoarthritis and went to the operating room on 05/27/2022 and underwent left total knee  arthoplasty. The patient received perioperative antibiotics for prophylaxis (see below). The patient tolerated the procedure well and was transported to PACU in stable condition. After meeting PACU criteria, the patient was subsequently transferred to the Orthopaedics/Rehabilitation unit.   The patient received DVT prophylaxis in the form of early mobilization, Lovenox, TED hose, and SCDs . A sacral pad had been placed and heels were elevated off of the bed with rolled towels in order to protect skin integrity. Foley catheter was discontinued on postoperative day #0. Wound drains were discontinued on postoperative day #1. The surgical incision was healing well without signs of infection.  Physical therapy was initiated postoperatively for transfers, gait training, and strengthening. Occupational therapy was initiated for activities of daily living and evaluation for assisted devices. Rehabilitation goals were reviewed in detail with the patient. The patient made steady progress with physical therapy and physical therapy recommended discharge to Home.   Patient did have sustained tachycardia POD#2. EKG showed A-fib with RVR, and a consult was placed to the hospitalist on 05/29/22.  The hospitalist team obtained an ECHO and started the patient on Eliquis '5mg'$  twice daily.  The patient was discharged home on 05/30/22 on Losartan, Metoprolol and Eliquis.  Patient will follow-up on a outpatient basis with cardiology.  The patient achieved the preliminary goals of this hospitalization and was felt to be medically and orthopaedically appropriate for discharge.  She was given perioperative antibiotics:  Anti-infectives (From admission, onward)    Start     Dose/Rate Route Frequency Ordered Stop   05/27/22 1430  ceFAZolin (ANCEF) IVPB 2g/100 mL premix        2 g 200 mL/hr over 30 Minutes Intravenous Every 6 hours 05/27/22 1359 05/27/22 2204   05/27/22 0619  ceFAZolin (ANCEF) 2-4 GM/100ML-% IVPB       Note to  Pharmacy: Olena Mater F: cabinet override      05/27/22 0619 05/27/22 0814   05/27/22 0600  ceFAZolin (ANCEF) IVPB 2g/100 mL premix        2 g 200 mL/hr over 30 Minutes Intravenous On call to O.R. 05/27/22 0040 05/27/22 0755     .  Recent vital signs:  Vitals:   05/30/22 0545 05/30/22 1018  BP: (!) 114/54 119/65  Pulse: 65 75  Resp: 17 17  Temp: 97.7 F (36.5 C) 98 F (36.7 C)  SpO2: 97% 97%    Recent laboratory studies:  No results for input(s): WBC, HGB, HCT, PLT, K, CL, CO2, BUN, CREATININE, GLUCOSE, CALCIUM, LABPT, INR in the last 72 hours.  Diagnostic Studies: DG Knee Left Port  Result Date: 05/27/2022 CLINICAL DATA:  87 year old female status post left knee replacement. EXAM: PORTABLE LEFT KNEE - 1-2 VIEW COMPARISON:  06/02/2021. FINDINGS: AP and cross-table lateral views at 1119 hours. Left total knee arthroplasty cemented hardware in place, appears intact and aligned. Suprapatellar surgical drains in place. Anterior skin staples. No unexpected osseous changes identified. IMPRESSION: Left total knee arthroplasty with no adverse features. Electronically Signed   By: Genevie Ann M.D.   On: 05/27/2022 11:34   ECHOCARDIOGRAM COMPLETE  Result Date: 05/30/2022    ECHOCARDIOGRAM REPORT   Patient Name:  Beth Rodgers Date of Exam: 05/30/2022 Medical Rec #:  [id number]     Height:       66.0 in Accession #:    [id number]    Weight:       170.0 lb Date of Birth:  1935/03/10     BSA:          1.866 m Patient Age:    70 years      BP:           114/54 mmHg Patient Gender: F             HR:           69 bpm. Exam Location:  [facility] Procedure: 2D Echo and Strain Analysis Indications:     Atrial Fibrillation I48.91  History:         Patient has prior history of Echocardiogram examinations, most                  recent 07/11/2020.  Sonographer:     Kathlen Brunswick RDCS Referring Phys:  [place] Diagnosing Phys: Eleonore Chiquito MD  Sonographer Comments: Global longitudinal strain was  attempted. IMPRESSIONS  1. Left ventricular ejection fraction, by estimation, is 55 to 60%. The left ventricle has normal function. The left ventricle has no regional wall motion abnormalities. Left ventricular diastolic parameters were normal.  2. Right ventricular systolic function is normal. The right ventricular size is mildly enlarged. Tricuspid regurgitation signal is inadequate for assessing PA pressure.  3. The mitral valve is grossly normal. Trivial mitral valve regurgitation. No evidence of mitral stenosis.  4. The aortic valve is tricuspid. There is mild calcification of the aortic valve. There is mild thickening of the aortic valve. Aortic valve regurgitation is mild. Aortic valve sclerosis/calcification is present, without any evidence of aortic stenosis.  5. The inferior vena cava is dilated in size with >50% respiratory variability, suggesting right atrial pressure of 8 mmHg. FINDINGS  Left Ventricle: Left ventricular ejection fraction, by estimation, is 55 to 60%. The left ventricle has normal function. The left ventricle has no regional wall motion abnormalities. Global longitudinal strain performed but not reported based on interpreter judgement due to suboptimal tracking. The left ventricular internal cavity size was normal in size. There is no left ventricular hypertrophy. Left ventricular diastolic parameters were normal. Right Ventricle: The right ventricular size is mildly enlarged. No increase in right ventricular wall thickness. Right ventricular systolic function is normal. Tricuspid regurgitation signal is inadequate for assessing PA pressure. Left Atrium: Left atrial size was normal in size. Right Atrium: Right atrial size was normal in size. Pericardium: There is no evidence of pericardial effusion. Mitral Valve: The mitral valve is grossly normal. Trivial mitral valve regurgitation. No evidence of mitral valve stenosis. Tricuspid Valve: The tricuspid valve is grossly normal. Tricuspid  valve regurgitation is trivial. No evidence of tricuspid stenosis. Aortic Valve: The aortic valve is tricuspid. There is mild calcification of the aortic valve. There is mild thickening of the aortic valve. Aortic valve regurgitation is mild. Aortic regurgitation PHT measures 525 msec. Aortic valve sclerosis/calcification is present, without any evidence of aortic stenosis. Aortic valve mean gradient measures 9.0 mmHg. Aortic valve peak gradient measures 18.5 mmHg. Aortic valve area, by VTI measures 1.43 cm. Pulmonic Valve: The pulmonic valve was grossly normal. Pulmonic valve regurgitation is trivial. No evidence of pulmonic stenosis. Aorta: The aortic root and ascending aorta are structurally normal, with no evidence of dilitation. Venous: The inferior  vena cava is dilated in size with greater than 50% respiratory variability, suggesting right atrial pressure of 8 mmHg. IAS/Shunts: The atrial septum is grossly normal.  LEFT VENTRICLE PLAX 2D LVIDd:         4.17 cm     Diastology LVIDs:         2.66 cm     LV e' medial:    7.62 cm/s LV PW:         1.00 cm     LV E/e' medial:  14.8 LV IVS:        1.11 cm     LV e' lateral:   6.53 cm/s LVOT diam:     1.90 cm     LV E/e' lateral: 17.3 LV SV:         64 LV SV Index:   34 LVOT Area:     2.84 cm  LV Volumes (MOD) LV vol d, MOD A2C: 52.9 ml LV vol d, MOD A4C: 66.4 ml LV vol s, MOD A2C: 19.9 ml LV vol s, MOD A4C: 26.8 ml LV SV MOD A2C:     33.0 ml LV SV MOD A4C:     66.4 ml LV SV MOD BP:      38.5 ml RIGHT VENTRICLE RV Basal diam:  2.73 cm RV S prime:     12.80 cm/s TAPSE (M-mode): 2.0 cm LEFT ATRIUM           Index        RIGHT ATRIUM           Index LA diam:      3.50 cm 1.88 cm/m   RA Area:     13.20 cm LA Vol (A2C): 48.7 ml 26.09 ml/m  RA Volume:   29.40 ml  15.75 ml/m LA Vol (A4C): 18.8 ml 10.07 ml/m  AORTIC VALVE                     PULMONIC VALVE AV Area (Vmax):    1.48 cm      PV Vmax:       0.90 m/s AV Area (Vmean):   1.36 cm      PV Peak grad:  3.2 mmHg  AV Area (VTI):     1.43 cm AV Vmax:           215.00 cm/s AV Vmean:          142.000 cm/s AV VTI:            0.448 m AV Peak Grad:      18.5 mmHg AV Mean Grad:      9.0 mmHg LVOT Vmax:         112.00 cm/s LVOT Vmean:        68.200 cm/s LVOT VTI:          0.226 m LVOT/AV VTI ratio: 0.50 AI PHT:            525 msec  AORTA Ao Root diam: 3.10 cm Ao Asc diam:  3.40 cm MITRAL VALVE                TRICUSPID VALVE MV Area (PHT): 3.13 cm     TV Peak grad:   28.5 mmHg MV Decel Time: 242 msec     TV Vmax:        2.67 m/s MV E velocity: 113.00 cm/s MV A velocity: 118.00 cm/s  SHUNTS MV E/A ratio:  0.96  Systemic VTI:  0.23 m                             Systemic Diam: 1.90 cm Eleonore Chiquito MD Electronically signed by Eleonore Chiquito MD Signature Date/Time: 05/30/2022/4:10:50 PM    Final    VAS [place] LOWER EXTREMITY VENOUS REFLUX  Result Date: 05/11/2022  Lower Venous Reflux Study Patient Name:  Beth Rodgers  Date of Exam:   05/07/2022 Medical Rec #: [id number]      Accession #:    [id number] Date of Birth: 1935-07-08      Patient Gender: F Patient Age:   81 years Exam Location:  [facility] Procedure:      VAS [place] LOWER EXTREMITY VENOUS REFLUX Referring Phys: Hortencia Pilar --------------------------------------------------------------------------------  Indications: Swelling, and varicosities.  Performing Technologist: Almira Coaster RVS  Examination Guidelines: A complete evaluation includes B-mode imaging, spectral Doppler, color Doppler, and power Doppler as needed of all accessible portions of each vessel. Bilateral testing is considered an integral part of a complete examination. Limited examinations for reoccurring indications may be performed as noted. The reflux portion of the exam is performed with the patient in reverse Trendelenburg. Significant venous reflux is defined as >500 ms in the superficial venous system, and >1 second in the deep venous system.  Venous Reflux Times  +--------------+--------+------+----------+------------+-----------------------+ RIGHT         Reflux  Reflux  Reflux  Diameter cmsComments                              No       Yes     Time                                       +--------------+--------+------+----------+------------+-----------------------+ CFV           no                                                          +--------------+--------+------+----------+------------+-----------------------+ FV prox       no                                                          +--------------+--------+------+----------+------------+-----------------------+ FV mid        no                                                          +--------------+--------+------+----------+------------+-----------------------+ FV dist       no                                                          +--------------+--------+------+----------+------------+-----------------------+  Popliteal     no                                                          +--------------+--------+------+----------+------------+-----------------------+ GSV at [facility]    no                                                          +--------------+--------+------+----------+------------+-----------------------+ GSV prox thigh                                    prior                                                                     ablation/stripping      +--------------+--------+------+----------+------------+-----------------------+ GSV mid thigh                                     prior                                                                     ablation/stripping      +--------------+--------+------+----------+------------+-----------------------+ GSV dist thigh                                    prior                                                                     ablation/stripping       +--------------+--------+------+----------+------------+-----------------------+ GSV at knee   no                          .35                             +--------------+--------+------+----------+------------+-----------------------+ GSV prox calf no                          .31                             +--------------+--------+------+----------+------------+-----------------------+  +--------------+--------+------+----------+------------+-----------------------+ LEFT  Reflux  Reflux  Reflux  Diameter cmsComments                              No       Yes     Time                                       +--------------+--------+------+----------+------------+-----------------------+ CFV           no                                                          +--------------+--------+------+----------+------------+-----------------------+ FV prox       no                                                          +--------------+--------+------+----------+------------+-----------------------+ FV mid        no                                                          +--------------+--------+------+----------+------------+-----------------------+ FV dist       no                                                          +--------------+--------+------+----------+------------+-----------------------+ Popliteal     no                                                          +--------------+--------+------+----------+------------+-----------------------+ GSV at [facility]                                        prior                                                                     ablation/stripping      +--------------+--------+------+----------+------------+-----------------------+ GSV prox thigh                                    prior  ablation/stripping       +--------------+--------+------+----------+------------+-----------------------+ GSV mid thigh                                     prior                                                                     ablation/stripping      +--------------+--------+------+----------+------------+-----------------------+ GSV dist thigh                            .49                             +--------------+--------+------+----------+------------+-----------------------+ GSV at knee                               .49                             +--------------+--------+------+----------+------------+-----------------------+ GSV prox calf                             .86                             +--------------+--------+------+----------+------------+-----------------------+   Summary: Bilateral: - No evidence of deep vein thrombosis seen in the lower extremities, bilaterally, from the common femoral through the popliteal veins. - No evidence of superficial venous thrombosis in the lower extremities, bilaterally. - No evidence of deep venous insufficiency seen bilaterally in the lower extremity. - No evidence of superficial venous reflux seen in the greater saphenous veins bilaterally.  *See table(s) above for measurements and observations. Electronically signed by Hortencia Pilar MD on 05/11/2022 at 4:44:02 PM.    Final     Discharge Medications:   Allergies as of 05/30/2022       Reactions   Anastrozole Other (See Comments), Itching, Rash   Back pain  Back pain  Back pain    Exemestane Rash   Pain in right hand  Pain in right hand  Pain in right hand    Naproxen Hives   Had bumps break out on the back  Had bumps break out on the back  Had bumps break out on the back  Had bumps break out on the back  Had bumps break out on the back         Medication List     TAKE these medications    alendronate 70 MG tablet Commonly known as: FOSAMAX Take 1 tablet (70 mg total)  by mouth every Sunday.   apixaban 5 MG Tabs tablet Commonly known as: ELIQUIS Take 1 tablet (5 mg total) by mouth 2 (two) times daily.   CALCIUM 600 + D PO Take 1 tablet by mouth daily. Ca 500 + Vit D 800   calcium carbonate 750 MG chewable tablet Commonly known as: TUMS EX Chew 750 mg by mouth daily as needed  for heartburn.   celecoxib 200 MG capsule Commonly known as: CELEBREX Take 1 capsule (200 mg total) by mouth 2 (two) times daily. What changed: when to take this   Ciclopirox 0.77 % gel Apply 1 application topically 2 (two) times daily as needed (fungus).   clobetasol cream 0.05 % Commonly known as: TEMOVATE Apply 1 application topically as directed. Qd to bid up to 5 days a week to aa psoriasis on body until clear, then prn flares, avoid face, groin, axilla   diclofenac sodium 1 % Gel Commonly known as: VOLTAREN Apply 1 application topically 3 (three) times daily. What changed:  when to take this reasons to take this   ipratropium 0.06 % nasal spray Commonly known as: ATROVENT Place 2 sprays into both nostrils every 8 (eight) hours as needed for allergies.   latanoprost 0.005 % ophthalmic solution Commonly known as: XALATAN latanoprost 0.005 % eye drops  PLACE 1 DROP INTO BOTH EYES ONCE EVERY EVENING   levothyroxine 75 MCG tablet Commonly known as: SYNTHROID Take 1 tablet (75 mcg total) by mouth daily. What changed: when to take this   losartan 50 MG tablet Commonly known as: COZAAR Take 1 tablet (50 mg total) by mouth daily. What changed:  medication strength how much to take   Magnesium 400 MG Tabs Take 400 mg by mouth daily.   metoprolol tartrate 25 MG tablet Commonly known as: LOPRESSOR Take 0.5 tablets (12.5 mg total) by mouth 2 (two) times daily.   multivitamin tablet Take 1 tablet by mouth daily.   oxyCODONE 5 MG immediate release tablet Commonly known as: Oxy IR/ROXICODONE Take 1 tablet (5 mg total) by mouth every 4 (four) hours as  needed for severe pain.   SELENIUM PO Take 1 tablet by mouth daily.   silver sulfADIAZINE 1 % cream Commonly known as: Silvadene Apply 1 application topically daily. What changed:  when to take this reasons to take this   tacrolimus 0.1 % ointment Commonly known as: PROTOPIC APPLY TOPICALLY TO AFFECTED AREA(S) TWICE DAILY What changed: See the new instructions.   traMADol 50 MG tablet Commonly known as: ULTRAM Take 1 tablet by mouth every 6 (six) hours as needed for pain. What changed: Another medication with the same name was added. Make sure you understand how and when to take each.   traMADol 50 MG tablet Commonly known as: ULTRAM Take 1 tablet (50 mg total) by mouth every 4 (four) hours as needed for moderate pain. What changed: You were already taking a medication with the same name, and this prescription was added. Make sure you understand how and when to take each.   vitamin C 500 MG tablet Commonly known as: ASCORBIC ACID Take 500 mg by mouth daily.   vitamin E 180 MG (400 UNITS) capsule Take 400 Units by mouth daily.   Zoryve 0.3 % Crea Generic drug: Roflumilast Apply 1 application. topically every other day. Sample               Discharge Care Instructions  (From admission, onward)           Start     Ordered   05/30/22 0000  Discharge wound care:       Comments: As per orthopedic recommendations.   05/30/22 1318            Disposition: Home with home health PT  Discharge Instructions     Diet - low sodium heart healthy   Complete by: As directed  Discharge wound care:   Complete by: As directed    As per orthopedic recommendations.   Increase activity slowly   Complete by: As directed         Follow-up Information     Fausto Skillern, PA-C Follow up on 06/11/2022.   Specialty: Orthopedic Surgery Why: at 1:45pm Contact information: [place] [address] [phone]         Dereck Leep, MD Follow up on 07/09/2022.   Specialty: Orthopedic Surgery Why: at 2:45pm Contact information: [address] [facility] [place] [address] [place] Follow up in 2 week(s).   Specialty: Cardiology Contact information: [place] [id number] [address] [place] [phone]        Ria Bush, MD Follow up in 1 week(s).   Specialty: Family Medicine Why: new AF dx, rx tolerance hemodynamic touchpoint Contact information: [place] [address] [phone]                  Lenna Sciara. Cameron Proud, PA-C, CAQ-OS 06/01/2022, 7:54 AM

## 2022-05-28 NOTE — Progress Notes (Signed)
  Subjective: 1 Day Post-Op Procedure(s) (LRB): COMPUTER ASSISTED TOTAL KNEE ARTHROPLASTY (Left) Patient reports pain as mild.   Patient is well, and has had no acute complaints or problems Plan is to go Home after hospital stay. Negative for chest pain and shortness of breath Fever: no Gastrointestinal: negative for nausea and vomiting.    Objective: Vital signs in last 24 hours: Temp:  [97.6 F (36.4 C)-98.7 F (37.1 C)] 97.8 F (36.6 C) (06/01 0739) Pulse Rate:  [53-85] 55 (06/01 0739) Resp:  [11-18] 17 (06/01 0739) BP: (101-157)/(53-75) 107/68 (06/01 0739) SpO2:  [94 %-99 %] 99 % (06/01 0739)  Intake/Output from previous day:  Intake/Output Summary (Last 24 hours) at 05/28/2022 0819 Last data filed at 05/28/2022 0606 Gross per 24 hour  Intake 2461.7 ml  Output 660 ml  Net 1801.7 ml    Intake/Output this shift: No intake/output data recorded.  Labs: No results for input(s): HGB in the last 72 hours. No results for input(s): WBC, RBC, HCT, PLT in the last 72 hours. No results for input(s): NA, K, CL, CO2, BUN, CREATININE, GLUCOSE, CALCIUM in the last 72 hours. No results for input(s): LABPT, INR in the last 72 hours.   EXAM General - Patient is Alert, Appropriate, and Oriented Extremity - Neurovascular intact Dorsiflexion/Plantar flexion intact Compartment soft Dressing/Incision -Postoperative dressing remains in place., Polar Care in place and working. , Hemovac in place. , Following removal of post-op dressing, minimal drainage noted. Motor Function - intact, moving foot and toes well on exam. Able to perform independent SLR.  Cardiovascular- Regular rate and rhythm, no murmurs/rubs/gallops Respiratory- Lungs clear to auscultation bilaterally Gastrointestinal- soft, nontender, and active bowel sounds   Assessment/Plan: 1 Day Post-Op Procedure(s) (LRB): COMPUTER ASSISTED TOTAL KNEE ARTHROPLASTY (Left) Principal Problem:   Total knee replacement  status  Estimated body mass index is 27.44 kg/m as calculated from the following:   Height as of this encounter: '5\' 6"'$  (1.676 m).   Weight as of this encounter: 77.1 kg. Advance diet Up with therapy  Anticipate discharge this PM pending completion of therapy goals.   Post-op dressing removed. , Hemovac removed., Mini compression dressing applied. , and  petroliumj jelly applied in light coating over distal LLE. TED hose applied to operative leg.   DVT Prophylaxis - Lovenox, Ted hose, and SCDs Weight-Bearing as tolerated to left leg  Cassell Smiles, PA-C [facility] 05/28/2022, 8:19 AM

## 2022-05-28 NOTE — Progress Notes (Signed)
Physical Therapy Treatment Patient Details Name: Beth Rodgers MRN: [id number] DOB: 14-Jan-1935 Today's Date: 05/28/2022   History of Present Illness Pt admitted for L TKA. Hx includes arthritis, glaucoma, vascular disease. Pt underwent general anesthesia. Pt is POD 1 at time of treatment.    PT Comments    Pt progressed ambulation to 200 ft with RW and CGA and demonstrated final seated exercises from TKA HEP. Pt appeared anxious during session requiring verbal cues and encouragement. Pt able to perform 1 transfer from chair with bilat UE support and CGA prior to ambulation and 2 transfers from chair with bilat UE support and supervision post ambulation. Pt verbalized concerns of discharging home as she wanted to discharge to a SNF, but wanted to stay 1 more day if discharging home to get stronger (PA notified). However, pt reports having support from her husband and son to complete necessary mobility tasks at home. Based on pt current functional level and reported assistance at home, pt appears able to discharge home with support from family and home health PT.    Recommendations for follow up therapy are one component of a multi-disciplinary discharge planning process, led by the attending physician.  Recommendations may be updated based on patient status, additional functional criteria and insurance authorization.  Follow Up Recommendations  Home health PT     Assistance Recommended at Discharge Intermittent Supervision/Assistance  Patient can return home with the following A little help with walking and/or transfers;A little help with bathing/dressing/bathroom;Assistance with cooking/housework;Assist for transportation;Help with stairs or ramp for entrance   Equipment Recommendations  BSC/3in1    Recommendations for Other Services       Precautions / Restrictions Precautions Precautions: Knee;Fall Precaution Booklet Issued: Yes (comment) Restrictions Weight Bearing Restrictions:  Yes LLE Weight Bearing: Weight bearing as tolerated     Mobility              Transfers Overall transfer level: Needs assistance Equipment used: Rolling walker (2 wheels) Transfers: Sit to/from Stand Sit to Stand: Min guard, Supervision           General transfer comment: x1 sit-to-stand with bilat UE support and CGA, x2 sit-to-stands with bilat UEs pushing from arm rests and supervision; attempted sit-to-stand with 1 UE on arm rest and 1 UE on RW, but pt unable.    Ambulation/Gait Ambulation/Gait assistance: Min guard Gait Distance (Feet): 200 Feet Assistive device: Rolling walker (2 wheels) Gait Pattern/deviations: Decreased stance time - left, Trunk flexed, Antalgic, Step-to pattern       General Gait Details: Verbal cues needed for maintaining upright posture and staying closer to RW. Step to progressing to step through gait pattern.  Wheelchair Mobility    Modified Rankin (Stroke Patients Only)       Balance Overall balance assessment: Needs assistance Sitting-balance support: No upper extremity supported, Feet supported Sitting balance-Leahy Scale: Good Sitting balance - Comments: able to reach forward to RW and to side for personal items on bed while sitting up with back unsupported in recliner   Standing balance support: Bilateral upper extremity supported Standing balance-Leahy Scale: Good Standing balance comment: Use of RW in standing (Periodically stood with 1 UE on RW while reaching forward to point with contralateral UE)                            Cognition Arousal/Alertness: Awake/alert Behavior During Therapy: WFL for tasks assessed/performed Overall Cognitive Status: Within Functional Limits for tasks assessed  General Comments: Appeared anxious with mobility tasks        Exercises Total Joint Exercises Goniometric ROM: L LE Knee 7-90 (taken in AM session) Other Exercises Other  Exercises: Seated ther-ex included L LE LAQ and Knee Flexion with washcloth under heel. 10 reps ea    General Comments        Pertinent Vitals/Pain Pain Assessment Pain Assessment: Faces Pain Score: 2  Faces Pain Scale: Hurts even more Pain Location: L LE Pain Descriptors / Indicators: Operative site guarding, Discomfort Pain Intervention(s): Ice applied, Patient requesting pain meds-RN notified, RN gave pain meds during session, Repositioned, Monitored during session, Limited activity within patient's tolerance (Nurse stated pt denied pain meds prior to PT. During session, pt noted increased pain and requested pain meds.)    Home Living         Prior Function            PT Goals (current goals can now be found in the care plan section) Acute Rehab PT Goals Patient Stated Goal: To get stronger before going home PT Goal Formulation: With patient Time For Goal Achievement: 06/10/22 Potential to Achieve Goals: Good Progress towards PT goals: Progressing toward goals    Frequency    BID      PT Plan Current plan remains appropriate    Co-evaluation              AM-PAC PT "6 Clicks" Mobility   Outcome Measure  Help needed turning from your back to your side while in a flat bed without using bedrails?: None Help needed moving from lying on your back to sitting on the side of a flat bed without using bedrails?: None Help needed moving to and from a bed to a chair (including a wheelchair)?: A Little Help needed standing up from a chair using your arms (e.g., wheelchair or bedside chair)?: A Little Help needed to walk in hospital room?: A Little Help needed climbing 3-5 steps with a railing? : A Little 6 Click Score: 20    End of Session Equipment Utilized During Treatment: Gait belt Activity Tolerance: Patient tolerated treatment well Patient left: in chair;with chair alarm set;with SCD's reapplied;Other (comment) (polar care in place, bilateral heels floating  due to towel rolls) Nurse Communication: Mobility status PT Visit Diagnosis: Muscle weakness (generalized) (M62.81);Difficulty in walking, not elsewhere classified (R26.2);Pain Pain - Right/Left: Left Pain - part of body: Knee     Time: 1332-1410 PT Time Calculation (min) (ACUTE ONLY): 38 min  Charges:  $Gait Training: 8-22 mins $Therapeutic Exercise: 8-22 mins $Therapeutic Activity: 8-22 mins                     Beth Rodgers, SPT    Alexica Schlossberg 05/28/2022, 3:32 PM

## 2022-05-28 NOTE — Evaluation (Addendum)
Occupational Therapy Evaluation Patient Details Name: Beth Rodgers MRN: [id number] DOB: 09/21/1935 Today's Date: 05/28/2022   History of Present Illness Pt admitted for L TKA. Hx includes arthritis, glaucoma, vascular disease. Pt underwent general anesthesia. Pt is POD 1 at time of treatment.   Clinical Impression   Pt seen for OT evaluation this date, POD#1 from above surgery. Pt was independent in all ADL prior to surgery, however occasionally using SPC for community mobility, furniture cruising at home. Pt is eager to return to PLOF with less pain and improved safety and independence. Pt currently requires minimal assist for LB dressing while in seated position due to pain and limited AROM of L knee. Pt educated in polar care mgt, compression stocking mgt, falls prevention strategies, home/routines modifications, and AE/DME for ADL. Pt verbalized understanding. Provided with handout to support recall and carryover. Pt would benefit from skilled OT services including additional instruction in dressing techniques with or without assistive devices for dressing and bathing skills to support recall and carryover prior to discharge and ultimately to maximize safety, independence, and minimize falls risk and caregiver burden. Recommend [place] upon discharge. Pt agreeable.     Recommendations for follow up therapy are one component of a multi-disciplinary discharge planning process, led by the attending physician.  Recommendations may be updated based on patient status, additional functional criteria and insurance authorization.   Follow Up Recommendations  Home health OT    Assistance Recommended at Discharge Intermittent Supervision/Assistance  Patient can return home with the following A little help with walking and/or transfers;A little help with bathing/dressing/bathroom;Assistance with cooking/housework;Assist for transportation;Help with stairs or ramp for entrance    Functional Status  Assessment  Patient has had a recent decline in their functional status and demonstrates the ability to make significant improvements in function in a reasonable and predictable amount of time.  Equipment Recommendations  Tub/shower seat    Recommendations for Other Services       Precautions / Restrictions Precautions Precautions: Knee;Fall Precaution Booklet Issued: Yes (comment) Precaution Comments: Educated and provided pt with precaution booklet with HEP included. Restrictions Weight Bearing Restrictions: No LLE Weight Bearing: Weight bearing as tolerated      Mobility Bed Mobility               General bed mobility comments: NT up in recliner    Transfers                   General transfer comment: declined, just back from PT working on stairs      Balance Overall balance assessment: Needs assistance Sitting-balance support: No upper extremity supported, Feet supported Sitting balance-Leahy Scale: Good                                     ADL either performed or assessed with clinical judgement   ADL Overall ADL's : Needs assistance/impaired                                       General ADL Comments: Pt requires MIN A for LB ADL 2/2 decr strength/ROM/pain in L knee. MOD A for compression stockings and polar care mgt. Pt reports family able to assist with this.     Vision         Perception  Praxis      Pertinent Vitals/Pain Pain Assessment Pain Assessment: 0-10 Pain Score: 1  Pain Location: L LE Pain Descriptors / Indicators: Operative site guarding, Discomfort Pain Intervention(s): Limited activity within patient's tolerance, Monitored during session, Repositioned, Ice applied, RN gave pain meds during session     Hand Dominance     Extremity/Trunk Assessment Upper Extremity Assessment Upper Extremity Assessment: Overall WFL for tasks assessed   Lower Extremity Assessment Lower Extremity  Assessment: LLE deficits/detail LLE Deficits / Details: expected post-op strength and ROM deficits       Communication Communication Communication: No difficulties   Cognition Arousal/Alertness: Awake/alert Behavior During Therapy: WFL for tasks assessed/performed Overall Cognitive Status: Within Functional Limits for tasks assessed                                       General Comments       Exercises Other Exercises Other Exercises: Pt educated in polar care mgt, compression stocking mgt, falls prevention strategies, home/routines modifications, and AE/DME for ADL. Pt verbalized understanding. Provided with handout.   Shoulder Instructions      Home Living Family/patient expects to be discharged to:: Private residence Living Arrangements: Spouse/significant other Available Help at Discharge: Family Type of Home: House Home Access: Stairs to enter Technical brewer of Steps: 1 Entrance Stairs-Rails: None Home Layout: One level     Bathroom Shower/Tub: Occupational psychologist: Handicapped height     [place]: Conservation officer, nature (2 wheels);Cane - single point          Prior Functioning/Environment Prior Level of Function : Independent/Modified Independent             Mobility Comments: Reports using SPC outside, furniture cruises at home. Was getting OP PT prior to surgery. Reports no falls.          OT Problem List: Decreased strength;Decreased range of motion;Pain;Impaired balance (sitting and/or standing);Decreased knowledge of use of DME or AE      OT Treatment/Interventions: Self-care/ADL training;Balance training;Therapeutic exercise;DME and/or AE instruction;Therapeutic activities;Patient/family education    OT Goals(Current goals can be found in the care plan section) Acute Rehab OT Goals Patient Stated Goal: go home and get therapy OT Goal Formulation: With patient Time For Goal Achievement: 06/11/22 Potential  to Achieve Goals: Good ADL Goals Pt Will Perform Lower Body Dressing: with modified independence;with adaptive equipment;sit to/from stand Pt Will Transfer to Toilet: with modified independence;ambulating (LRAD) Additional ADL Goal #1: Pt will independently instruct family in polar care mgt Additional ADL Goal #2: Pt will independently instruct family in compression stocking mgt  OT Frequency: Min 2X/week    Co-evaluation              AM-PAC OT "6 Clicks" Daily Activity     Outcome Measure Help from another person eating meals?: None Help from another person taking care of personal grooming?: None Help from another person toileting, which includes using toliet, bedpan, or urinal?: A Little Help from another person bathing (including washing, rinsing, drying)?: A Little Help from another person to put on and taking off regular upper body clothing?: None Help from another person to put on and taking off regular lower body clothing?: A Little 6 Click Score: 21   End of Session    Activity Tolerance: Patient tolerated treatment well Patient left: in chair;with call bell/phone within reach;with chair alarm set  OT Visit  Diagnosis: Other abnormalities of gait and mobility (R26.89);Pain Pain - Right/Left: Left Pain - part of body: Knee                Time: 1020-1045 OT Time Calculation (min): 25 min Charges:  OT General Charges $OT Visit: 1 Visit OT Evaluation $OT Eval Moderate Complexity: 1 Mod OT Treatments $Self Care/Home Management : 8-22 mins  Ardeth Perfect., MPH, MS, OTR/L ascom [phone] 05/28/22, 12:42 PM

## 2022-05-29 DIAGNOSIS — Z96652 Presence of left artificial knee joint: Secondary | ICD-10-CM

## 2022-05-29 DIAGNOSIS — I482 Chronic atrial fibrillation, unspecified: Secondary | ICD-10-CM

## 2022-05-29 MED ORDER — METOPROLOL TARTRATE 25 MG PO TABS
12.5000 mg | ORAL_TABLET | Freq: Two times a day (BID) | ORAL | Status: DC
  Administered 2022-05-29 – 2022-05-30 (×2): 12.5 mg via ORAL
  Filled 2022-05-29 (×2): qty 1

## 2022-05-29 MED ORDER — DILTIAZEM HCL 25 MG/5ML IV SOLN
10.0000 mg | Freq: Once | INTRAVENOUS | Status: AC
  Administered 2022-05-29: 10 mg via INTRAVENOUS
  Filled 2022-05-29: qty 5

## 2022-05-29 MED ORDER — LOSARTAN POTASSIUM 50 MG PO TABS
50.0000 mg | ORAL_TABLET | Freq: Every day | ORAL | Status: DC
  Administered 2022-05-30: 50 mg via ORAL
  Filled 2022-05-29: qty 1

## 2022-05-29 MED ORDER — DILTIAZEM LOAD VIA INFUSION: 10.0000 mg | Freq: Once | INTRAVENOUS | Status: DC

## 2022-05-29 MED ORDER — APIXABAN 5 MG PO TABS
5.0000 mg | ORAL_TABLET | Freq: Two times a day (BID) | ORAL | Status: DC
  Administered 2022-05-29 – 2022-05-30 (×2): 5 mg via ORAL
  Filled 2022-05-29 (×2): qty 1

## 2022-05-29 NOTE — Progress Notes (Signed)
Physical Therapy Treatment Patient Details Name: Beth Rodgers MRN: [id number] DOB: 1935/01/14 Today's Date: 05/29/2022   History of Present Illness Pt admitted for L TKA. Hx includes arthritis, glaucoma, vascular disease. Pt underwent general anesthesia. Pt is POD 2 at time of treatment.    PT Comments    PM session focused on ambulation and pt and family education (husband present) on seated exercise program. Pt ambulated 180 ft with RW and CGA with 1 standing rest break to assess HR. Pt HR ranged from 120-132 bpm during session with standing activities, and a HR of 122 bpm at start and end of session in seated position (PA notified). Pt reported pain L knee 0/10 at start of session (pt received pain meds at start of session) and 2/10 at end of session. Pt completed seated HEP with pt and husband education and review of handout. Left knee flexion ROM at 92 degrees post-ambulation. Pt and family agreeable to home health PT and pt reported feeling better about returning home. Pt and pts husband report no questions or concerns for discharge home today. Pt appears safe to discharge home with assist of family. Nursing and PA notified regarding pt status.   Recommendations for follow up therapy are one component of a multi-disciplinary discharge planning process, led by the attending physician.  Recommendations may be updated based on patient status, additional functional criteria and insurance authorization.  Follow Up Recommendations  Home health PT     Assistance Recommended at Discharge Intermittent Supervision/Assistance  Patient can return home with the following A little help with walking and/or transfers;A little help with bathing/dressing/bathroom;Assistance with cooking/housework;Assist for transportation;Help with stairs or ramp for entrance   Equipment Recommendations  BSC/3in1    Recommendations for Other Services       Precautions / Restrictions Precautions Precautions:  Knee;Fall Precaution Booklet Issued: Yes (comment) Restrictions Weight Bearing Restrictions: Yes LLE Weight Bearing: Weight bearing as tolerated     Mobility  Bed Mobility             Transfers Overall transfer level: Needs assistance Equipment used: Rolling walker (2 wheels) Transfers: Sit to/from Stand Sit to Stand: Min guard, Supervision           General transfer comment: x1 sit-to-stand with bilat UEs pushing from arm rests and supervision from chair    Ambulation/Gait Ambulation/Gait assistance: Min guard Gait Distance (Feet): 180 Feet Assistive device: Rolling walker (2 wheels) Gait Pattern/deviations: Decreased stance time - left, Antalgic, Step-to pattern       General Gait Details: Pt ambulated from chair in room around nurse station with brief standing rest break to assess HR (120-132 bpm). Chair follow during ambulation due to pt history of lightheadedness and HR fluctuations. No adverse symptoms reported during ambulation.    Stairs   Wheelchair Mobility    Modified Rankin (Stroke Patients Only)       Balance Overall balance assessment: Needs assistance Sitting-balance support: No upper extremity supported, Feet supported Sitting balance-Leahy Scale: Good Sitting balance - Comments: Able to reach forward to RW and appeared comfortable and balanced sitting in recliner with back unsupported; ability to scoot fwd/bwd independently   Standing balance support: Bilateral upper extremity supported Standing balance-Leahy Scale: Good Standing balance comment: Use of RW in standing/ambulating. Able to take 1 UE off RW without LOB in standing.                            Cognition  Arousal/Alertness: Awake/alert Behavior During Therapy: WFL for tasks assessed/performed Overall Cognitive Status: Within Functional Limits for tasks assessed                                 General Comments: Appeared anxious with mobility tasks         Exercises Total Joint Exercises Goniometric ROM: L Knee Flex 92 degrees Other Exercises Other Exercises: Seated ther-ex of L LE included 10 reps ea of LAQs and Knee Flexion (Pt and husband education provided on TKA HEP booklet reviewing seated exercises.)    General Comments        Pertinent Vitals/Pain Pain Assessment Pain Assessment: 0-10 Pain Score: 2  (0/10 at start of session, 2/10 end of session) Pain Location: L LE Pain Descriptors / Indicators: Operative site guarding, Discomfort Pain Intervention(s): Limited activity within patient's tolerance, Monitored during session, RN gave pain meds during session, Ice applied, Repositioned    Home Living                          Prior Function            PT Goals (current goals can now be found in the care plan section) Acute Rehab PT Goals Patient Stated Goal: To go home PT Goal Formulation: With patient Time For Goal Achievement: 06/10/22 Potential to Achieve Goals: Good Progress towards PT goals: Progressing toward goals    Frequency    BID      PT Plan Current plan remains appropriate    Co-evaluation              AM-PAC PT "6 Clicks" Mobility   Outcome Measure  Help needed turning from your back to your side while in a flat bed without using bedrails?: None Help needed moving from lying on your back to sitting on the side of a flat bed without using bedrails?: None Help needed moving to and from a bed to a chair (including a wheelchair)?: A Little Help needed standing up from a chair using your arms (e.g., wheelchair or bedside chair)?: A Little Help needed to walk in hospital room?: A Little Help needed climbing 3-5 steps with a railing? : A Little 6 Click Score: 20    End of Session Equipment Utilized During Treatment: Gait belt Activity Tolerance: Patient tolerated treatment well Patient left: in chair;with chair alarm set;with SCD's reapplied;Other (comment) (polar care in  place, bilateral heels floating due to towel rolls) Nurse Communication: Mobility status PT Visit Diagnosis: Muscle weakness (generalized) (M62.81);Difficulty in walking, not elsewhere classified (R26.2);Pain Pain - Right/Left: Left Pain - part of body: Knee     Time: 5329-9242 PT Time Calculation (min) (ACUTE ONLY): 24 min  Charges:                     Rella Larve, SPT   Jshaun Abernathy 05/29/2022, 3:58 PM

## 2022-05-29 NOTE — Progress Notes (Addendum)
  Subjective: 2 Days Post-Op Procedure(s) (LRB): COMPUTER ASSISTED TOTAL KNEE ARTHROPLASTY (Left) Patient reports pain as moderate.  Patient just returned from [facility], no BM but has passed gas. Plan is to go Skilled nursing facility after hospital stay. Negative for chest pain and shortness of breath Fever: no Gastrointestinal: negative for nausea and vomiting.    Objective: Vital signs in last 24 hours: Temp:  [97.5 F (36.4 C)-98.4 F (36.9 C)] 98.3 F (36.8 C) (06/02 0510) Pulse Rate:  [57-80] 74 (06/02 0510) Resp:  [16-18] 18 (06/02 0510) BP: (115-150)/(58-74) 150/74 (06/02 0510) SpO2:  [98 %-100 %] 98 % (06/02 0510)  Intake/Output from previous day:  Intake/Output Summary (Last 24 hours) at 05/29/2022 1027 Last data filed at 05/28/2022 1642 Gross per 24 hour  Intake 480 ml  Output 750 ml  Net -270 ml    Intake/Output this shift: No intake/output data recorded.  Labs: No results for input(s): HGB in the last 72 hours. No results for input(s): WBC, RBC, HCT, PLT in the last 72 hours. No results for input(s): NA, K, CL, CO2, BUN, CREATININE, GLUCOSE, CALCIUM in the last 72 hours. No results for input(s): LABPT, INR in the last 72 hours.   EXAM General - Patient is Alert, Appropriate, and Oriented Extremity - Neurovascular intact Dorsiflexion/Plantar flexion intact Compartment soft Dressing/Incision -clean, dry, minimal bloody drainage Motor Function - intact, moving foot and toes well on exam.    Cardiovascular-  tachycardic rate, regular rhythm Respiratory- Lungs clear to auscultation bilaterally Gastrointestinal- soft, nontender, and active bowel sounds   Assessment/Plan: 2 Days Post-Op Procedure(s) (LRB): COMPUTER ASSISTED TOTAL KNEE ARTHROPLASTY (Left) Principal Problem:   Total knee replacement status  Estimated body mass index is 27.44 kg/m as calculated from the following:   Height as of this encounter: '5\' 6"'$  (1.676 m).   Weight as of this encounter: 77.1  kg. Advance diet Up with therapy  Plan for d/c later this PM after PT. Patient is anxious about standing from sitting position, will have PT work on this to prepare patient for home.   Recheck vitals after patient has been resting, spoke with CNA.     DVT Prophylaxis - Lovenox, Ted hose, and SCDs Weight-Bearing as tolerated to left leg  Cassell Smiles, PA-C [facility] Orthopaedic Surgery 05/29/2022, 10:27 AM

## 2022-05-29 NOTE — Progress Notes (Signed)
Physical Therapy Treatment Patient Details Name: Beth Rodgers MRN: [id number] DOB: 07/13/1935 Today's Date: 05/29/2022   History of Present Illness Pt admitted for L TKA. Hx includes arthritis, glaucoma, vascular disease. Pt underwent general anesthesia. Pt is POD 2 at time of treatment.    PT Comments    Today's AM session focused on patient and family (husband and son present) education on bed mobility, transfers, stair training, exercise program, and safe car transfers. Pt reported left LE pain at 6/10 at start of session. Pt HR ranged from 91-140 bpm, but primarily remained in the 120s bpm during session (PA notified). Pt demonstrated supine HEP and completed 1 sit-to-stand from bed (home bed height) with CGA and 2 from chair with bilat arm rests simulating home set-up and SBA. Pt ascended/descended one 8" step using RW and CGA. Pt and family thoroughly educated on two options for ascending/descending steps: RW and wheelchair methods as patient stated she has a wheelchair at home. Pt continued to appear anxious with step and reported feeling lightheaded. Pt and pts husband reported pt has history of episodes of lightheadedness at home prior to surgery. Pt and family agreeable to recommended home health PT at end of today's session. PM session to focus on ambulation.    Recommendations for follow up therapy are one component of a multi-disciplinary discharge planning process, led by the attending physician.  Recommendations may be updated based on patient status, additional functional criteria and insurance authorization.  Follow Up Recommendations  Home health PT     Assistance Recommended at Discharge Intermittent Supervision/Assistance  Patient can return home with the following A little help with walking and/or transfers;A little help with bathing/dressing/bathroom;Assistance with cooking/housework;Assist for transportation;Help with stairs or ramp for entrance   Equipment  Recommendations  BSC/3in1    Recommendations for Other Services       Precautions / Restrictions Precautions Precautions: Knee;Fall Precaution Booklet Issued: Yes (comment) Restrictions Weight Bearing Restrictions: Yes LLE Weight Bearing: Weight bearing as tolerated     Mobility  Bed Mobility Overal bed mobility: Needs Assistance Bed Mobility: Supine to Sit     Supine to sit: Modified independent (Device/Increase time)     General bed mobility comments: Supine-to-sit EOB with pt able to position L LE independently using UE support    Transfers Overall transfer level: Needs assistance Equipment used: Rolling walker (2 wheels) Transfers: Sit to/from Stand Sit to Stand: Min guard, Supervision           General transfer comment: x1 sit-to-stand with bilat UE support and CGA from bed at-home height, x2 sit-to-stands with bilat UEs pushing from arm rests and supervision from chair    Ambulation/Gait Ambulation/Gait assistance: Min guard Gait Distance (Feet): 5 Feet Assistive device: Rolling walker (2 wheels) Gait Pattern/deviations: Decreased stance time - left, Trunk flexed, Antalgic, Step-to pattern       General Gait Details: Pt ambulated from EOB to chair within room   Stairs Stairs: Yes Stairs assistance: Min guard Stair Management: With walker Number of Stairs: 1 (Ascended/descended 8" step with RW) General stair comments: Demonstration and verbal cues for sequencing steps with RW. Thorough pt and family (husband and son) education provided on proper use of RW and wheelchair use options for ascending/descending steps to get into home. Pts husband and son practiced hands-on wheelchair sequencing at steps. Pt continues to appear anxious with steps, and reported feeling lightheaded. Symptoms resolved once sitting in recliner.   Wheelchair Mobility    Modified Rankin (Stroke Patients  Only)       Balance Overall balance assessment: Needs  assistance Sitting-balance support: No upper extremity supported, Feet supported Sitting balance-Leahy Scale: Good Sitting balance - Comments: Able to reach forward to RW and appeared comfortable and balanced sitting on EOB with back unsupported; ability to scoot fwd/bwd independently   Standing balance support: Bilateral upper extremity supported Standing balance-Leahy Scale: Good Standing balance comment: Use of RW in standing/ambulating                            Cognition Arousal/Alertness: Awake/alert Behavior During Therapy: WFL for tasks assessed/performed Overall Cognitive Status: Within Functional Limits for tasks assessed                                 General Comments: Appeared anxious with mobility tasks        Exercises Total Joint Exercises Goniometric ROM: L Knee Ext 7 degrees short of neutral (Will obtain knee flex measurement at PM session) Other Exercises Other Exercises: Supine ther-ex of L LE included 10 reps ea of SLR, SAQ, heel slides. (Pt and family education provided on TKA HEP booklet verbally reviewing additional exercises.)    General Comments        Pertinent Vitals/Pain Pain Assessment Pain Assessment: 0-10 Pain Score: 6  Pain Location: L LE (Upper thigh) Pain Descriptors / Indicators: Operative site guarding, Discomfort Pain Intervention(s): Limited activity within patient's tolerance, Monitored during session, Repositioned, Ice applied    Home Living                          Prior Function            PT Goals (current goals can now be found in the care plan section) Acute Rehab PT Goals Patient Stated Goal: To go home PT Goal Formulation: With patient Time For Goal Achievement: 06/10/22 Potential to Achieve Goals: Good Progress towards PT goals: Progressing toward goals    Frequency    BID      PT Plan Current plan remains appropriate    Co-evaluation              AM-PAC PT "6  Clicks" Mobility   Outcome Measure  Help needed turning from your back to your side while in a flat bed without using bedrails?: None Help needed moving from lying on your back to sitting on the side of a flat bed without using bedrails?: None Help needed moving to and from a bed to a chair (including a wheelchair)?: A Little Help needed standing up from a chair using your arms (e.g., wheelchair or bedside chair)?: A Little Help needed to walk in hospital room?: A Little Help needed climbing 3-5 steps with a railing? : A Little 6 Click Score: 20    End of Session Equipment Utilized During Treatment: Gait belt Activity Tolerance: Patient tolerated treatment well Patient left: in chair;with chair alarm set;with SCD's reapplied;Other (comment) (polar care in place, bilateral heels floating due to towel rolls) Nurse Communication: Mobility status PT Visit Diagnosis: Muscle weakness (generalized) (M62.81);Difficulty in walking, not elsewhere classified (R26.2);Pain Pain - Right/Left: Left Pain - part of body: Knee     Time: 1610-9604 PT Time Calculation (min) (ACUTE ONLY): 62 min  Charges:  Jeziel Hoffmann, SPT   [place] 05/29/2022, 2:09 PM

## 2022-05-29 NOTE — Progress Notes (Addendum)
OT Cancellation Note  Patient Details Name: Beth Rodgers MRN: [id number] DOB: [birth year]   Cancelled Treatment:    Reason Eval/Treat Not Completed: Medical issues which prohibited therapy (OT treatment attempted. Pt. receiving enema care services.Will reattempt at a later time, or date)  Harrel Carina, MS, OTR/L   Harrel Carina 05/29/2022, 10:47 AM

## 2022-05-29 NOTE — Consult Note (Signed)
Initial Consultation Note   Patient: Beth Rodgers QHU:[id number] DOB: Feb 17, 1935 PCP: Ria Bush, MD DOA: 05/27/2022 DOS: the patient was seen and examined on 05/29/2022 Primary service: Dereck Leep, MD  Referring physician: Dr. [name] Reason for consult: New onset A-fib with RVR  Assessment/Plan: Assessment and Plan: No notes have been filed under this hospital service. Service: Hospitalist  #1 new onset atrial fibrillation with rapid ventricular response: Patient reports that her brother had A-fib also.  She also has had no prior cardiac history.  We will treat the patient with 1 dose of IV Cardizem.  Decrease her losartan from 100 mg to 50 mg daily due to borderline blood pressure.  Add Lopressor 12.5 mg twice daily.  Initiate apixaban.  Get echocardiogram and set up patient for follow-up outpatient with cardiology.  #2 status post left total knee replacement: Defer to orthopedics  #3 essential hypertension: Blood pressure appears well controlled.  Continue monitoring  #4 GERD: Continue with PPIs   TRH will continue to follow the patient.  HPI: Beth Rodgers is a 87 y.o. female with past medical history of essential hypertension, hyperlipidemia, anxiety disorder among other things, hypothyroidism as well as lymphedema who was admitted for left total knee replacement.  Patient had successful surgery and was being discharged home today.  Her vitals showed tachycardia.  Subsequent EKG showed atrial fibrillation with rapid ventricular response.  Patient has no chest pain.  No prior cardiac history.  Denied any other complaints.  Due to patient's age of onset and recent family history of cardiac disease patient is being evaluated in consultation for this new onset A-fib with RVR.Marland Kitchen  Review of Systems: As mentioned in the history of present illness. All other systems reviewed and are negative. Past Medical History:  Diagnosis Date   Basal cell carcinoma (BCC)    txted with MOHs in  past   Cellulitis 08/10/2018   Chronic venous insufficiency    with varicose veins   Family history of breast cancer    Family history of thyroid cancer    GERD (gastroesophageal reflux disease)    Glaucoma    History  of basal cell carcinoma    left nare/BREAST CANCER   History of chicken pox    Hypertension    Hypoglycemia    Hypothyroid    Lymphedema    LE   Osteoarthritis    back pain, L knee pain   Osteopenia 06/2009   DEXA 11/2015: T -2.3 hip, -2.2 spine, on longterm bisphosphonate/evista   Psoriasis    Pyogenic granuloma 04/16/2016   Past Surgical History:  Procedure Laterality Date   BASAL CELL CARCINOMA EXCISION     located on face   BREAST BIOPSY Right    core done ing [place]?   BREAST BIOPSY Left 08/16/2020   3 area bx 4:00 X, IMC, 6:00Q IMC, LN hydro #3-benign   BREAST LUMPECTOMY Left 09/11/2020   two areas of [facility]   CATARACT EXTRACTION Bilateral    bilateral   DEXA  06/2009   T score Spine: -1.8, hip -2.0   EXCISION OF BREAST BIOPSY Left 09/11/2020   Procedure: EXCISION OF BREAST BIOPSY;  Surgeon: [name], MD;  Location: [facility] ORS;  Service: General;  Laterality: Left;   Foam sclerotherapy Bilateral 09/2015   [place], [place]   KNEE ARTHROPLASTY Left 05/27/2022   Procedure: COMPUTER ASSISTED TOTAL KNEE ARTHROPLASTY;  Surgeon: Dereck Leep, MD;  Location: [facility] ORS;  Service: Orthopedics;  Laterality: Left;  MASTECTOMY W/ SENTINEL NODE BIOPSY Left 06/25/2021   Procedure: MASTECTOMY WITH SENTINEL LYMPH NODE BIOPSY;  Surgeon: [name], MD;  Location: [facility] ORS;  Service: General;  Laterality: Left;   NASAL RECONSTRUCTION  2005   for [place] L nare s/p Mohs   nuclear stress test  2014   normal in [place] BX Left 09/11/2020   Procedure: PARTIAL MASTECTOMY WITH NEEDLE LOCALIZATION AND AXILLARY SENTINEL LYMPH NODE BX;  Surgeon: [name],  MD;  Location: [facility] ORS;  Service: General;  Laterality: Left;   RADIOFREQUENCY ABLATION Left 01/2012   remnant L G saphenous vein below knee   RADIOFREQUENCY ABLATION Right 02/2013   RLE vein ablation    VEIN LIGATION AND STRIPPING  remote   bilateral G saphenous veins   Social History:  reports that she has never smoked. She has never used smokeless tobacco. She reports current alcohol use. She reports that she does not use drugs.  Allergies  Allergen Reactions   Anastrozole Other (See Comments), Itching and Rash    Back pain  Back pain  Back pain    Exemestane Rash    Pain in right hand  Pain in right hand  Pain in right hand    Naproxen Hives    Had bumps break out on the back  Had bumps break out on the back  Had bumps break out on the back  Had bumps break out on the back  Had bumps break out on the back     Family History  Problem Relation Age of Onset   Cancer Mother        thyroid cancer   Heart disease Father        CHF   Diabetes Father    Glaucoma Father    Arthritis Father    Coronary artery disease Maternal Uncle    Bipolar disorder Son    Heart disease Sister        CHF   Stroke Sister    Brain cancer Grandson        astrocytoma   Breast cancer Cousin        dx 44s    Prior to Admission medications   Medication Sig Start Date End Date Taking? Authorizing Provider  Calcium Carb-Cholecalciferol (CALCIUM 600 + D PO) Take 1 tablet by mouth daily. Ca 500 + Vit D 800   Yes [provider]  Ciclopirox 0.77 % gel Apply 1 application topically 2 (two) times daily as needed (fungus).   Yes [provider]  clobetasol cream (TEMOVATE) 9.56 % Apply 1 application topically as directed. Qd to bid up to 5 days a week to aa psoriasis on body until clear, then prn flares, avoid face, groin, axilla 03/10/21  Yes [name], MD  diclofenac sodium (VOLTAREN) 1 % GEL Apply 1 application topically 3 (three) times daily. Patient taking differently:  Apply 1 application. topically 3 (three) times daily as needed (pain). 05/29/19  Yes Ria Bush, MD  enoxaparin (LOVENOX) 40 MG/0.4ML injection Inject 0.4 mLs (40 mg total) into the skin daily for 14 days. 05/28/22 06/11/22 Yes Tamala Julian B, PA-C  latanoprost (XALATAN) 0.005 % ophthalmic solution latanoprost 0.005 % eye drops  PLACE 1 DROP INTO BOTH EYES ONCE EVERY EVENING 11/02/21  Yes [provider]  levothyroxine (SYNTHROID) 75 MCG tablet Take 1 tablet (75 mcg total) by mouth daily. Patient taking differently: Take 75 mcg by mouth daily  before breakfast. 06/24/21  Yes Ria Bush, MD  losartan (COZAAR) 100 MG tablet TAKE 1 TABLET BY MOUTH EVERY DAY Patient taking differently: Take 100 mg by mouth at bedtime. 08/26/21  Yes Ria Bush, MD  Magnesium 400 MG TABS Take 400 mg by mouth daily.   Yes [provider]  Multiple Vitamin (MULTIVITAMIN) tablet Take 1 tablet by mouth daily.   Yes [provider]  Roflumilast (ZORYVE) 0.3 % CREA Apply 1 application. topically every other day. Sample   Yes [provider]  SELENIUM PO Take 1 tablet by mouth daily.   Yes [provider]  silver sulfADIAZINE (SILVADENE) 1 % cream Apply 1 application topically daily. Patient taking differently: Apply 1 application. topically as needed. 12/15/21  Yes Schnier, Dolores Lory, MD  tacrolimus (PROTOPIC) 0.1 % ointment APPLY TOPICALLY TO AFFECTED AREA(S) TWICE DAILY Patient taking differently: 1 application. as needed. 06/16/21  Yes [name], MD  vitamin C (ASCORBIC ACID) 500 MG tablet Take 500 mg by mouth daily.   Yes [provider]  vitamin E 180 MG (400 UNITS) capsule Take 400 Units by mouth daily.   Yes [provider]  alendronate (FOSAMAX) 70 MG tablet Take 1 tablet (70 mg total) by mouth every Sunday. 12/28/21   Ria Bush, MD  calcium carbonate (TUMS EX) 750 MG chewable tablet Chew 750 mg by mouth daily as needed for  heartburn. Patient not taking: Reported on 05/15/2022    [provider]  celecoxib (CELEBREX) 200 MG capsule Take 200 mg by mouth daily. 11/13/21   [provider]  celecoxib (CELEBREX) 200 MG capsule Take 1 capsule (200 mg total) by mouth 2 (two) times daily. 05/28/22   Tamala Julian B, PA-C  ipratropium (ATROVENT) 0.06 % nasal spray Place 2 sprays into both nostrils every 8 (eight) hours as needed for allergies. Patient not taking: Reported on 05/15/2022 03/12/22   [provider]  oxyCODONE (OXY IR/ROXICODONE) 5 MG immediate release tablet Take 1 tablet (5 mg total) by mouth every 4 (four) hours as needed for severe pain. 05/28/22   Fausto Skillern, PA-C  traMADol (ULTRAM) 50 MG tablet Take 1 tablet by mouth every 6 (six) hours as needed for pain. Patient not taking: Reported on 05/27/2022    [provider]  traMADol (ULTRAM) 50 MG tablet Take 1 tablet (50 mg total) by mouth every 4 (four) hours as needed for moderate pain. 05/28/22   Fausto Skillern, PA-C    Physical Exam: Vitals:   05/29/22 1251 05/29/22 1357 05/29/22 1540 05/29/22 1708  BP: 103/70 113/75 109/74 100/80  Pulse: (!) 124 (!) 125 64 (!) 118  Resp: '16 18 17 17  '$ Temp: (!) 97.5 F (36.4 C) 97.6 F (36.4 C) (!) 97.5 F (36.4 C)   TempSrc:  Oral Oral   SpO2: 99% 100% 98% 96%  Weight:      Height:       General: Stable no acute distress HEENT: Good air entry bilateral no wheeze rales or crackles Neck: Supple, no JVD no lymphadenopathy Cardiovascular: Irregularly irregular with tachycardia Abdomen: Soft, nontender with positive bowel sounds Extremities: Status post left total knee replacement  Data Reviewed:   There are no new results to review at this time.   Family Communication: Husband and son at bedside Primary team communication: Orthopedic team Thank you very much for involving [place] in the care of your patient.  AuthorBarbette Merino, MD 05/29/2022 7:21 PM  For on call review  [email or web address].

## 2022-05-29 NOTE — Progress Notes (Signed)
New orders for Diltiazem injection, patient not on Tele monitor. Inquired to hospitalist and  she ordered to be on it. Now on Tele monitor.

## 2022-05-29 NOTE — Progress Notes (Signed)
PT Cancellation Note  Patient Details Name: Beth Rodgers MRN: [id number] DOB: December 19, 1935   Cancelled Treatment:    Reason Eval/Treat Not Completed: Other (comment). PT attempted to see pt earlier this morning but deferred session until later in morning in order for family to arrive to complete family training. Second attempt to see pt, NT reported recently having pt at [facility] when pt felt dizzy and HR was in the 130s. Discussed with pt and family plan to return at around 36 for family training to allow pt to rest.   Jamesmichael Shadd 05/29/2022, 10:10 AM

## 2022-05-30 ENCOUNTER — Inpatient Hospital Stay (HOSPITAL_COMMUNITY)
Admission: AD | Admit: 2022-05-30 | Discharge: 2022-05-30 | Disposition: A | Discharge status: Home / Self Care | Payer: Medicare Other | Source: Ambulatory Visit | Attending: Internal Medicine | Admitting: Internal Medicine

## 2022-05-30 DIAGNOSIS — I4891 Unspecified atrial fibrillation: Secondary | ICD-10-CM

## 2022-05-30 LAB — ECHOCARDIOGRAM COMPLETE
AR max vel: 1.48 cm2
AV Area VTI: 1.43 cm2
AV Area mean vel: 1.36 cm2
AV Mean grad: 9 mmHg
AV Peak grad: 18.5 mmHg
Ao pk vel: 2.15 m/s
Area-P 1/2: 3.13 cm2
Calc EF: 62.4 %
Height: 66 in
P 1/2 time: 525 msec
S' Lateral: 2.66 cm
Single Plane A2C EF: 62.4 %
Single Plane A4C EF: 59.6 %
Weight: 2720 oz

## 2022-05-30 MED ORDER — APIXABAN 5 MG PO TABS: 5.0000 mg | ORAL_TABLET | Freq: Two times a day (BID) | ORAL | Status: DC

## 2022-05-30 MED ORDER — METOPROLOL TARTRATE 25 MG PO TABS: 12.5000 mg | ORAL_TABLET | Freq: Two times a day (BID) | ORAL | 0 refills | Status: DC

## 2022-05-30 MED ORDER — LOSARTAN POTASSIUM 50 MG PO TABS: 50.0000 mg | ORAL_TABLET | Freq: Every day | ORAL | 0 refills | Status: DC

## 2022-05-30 NOTE — Progress Notes (Signed)
Physical Therapy Treatment Patient Details Name: Beth Rodgers MRN: [id number] DOB: 1935/10/04 Today's Date: 05/30/2022   History of Present Illness Pt admitted for L TKA. Hx includes arthritis, glaucoma, vascular disease. Pt underwent general anesthesia. Pt is POD 2 at time of treatment.    PT Comments    Pt's alarm going off when author entered room. She was independently ambulating around room after having BM on BSC. Author encouraged pt to use call bell next time. She finished ambulating to recliner prior to performing strengthening exercises and ROM. Pt has cleared acute PT goals to safely DC home with son/spouse pending ECHO/ hospitalitis clearance.HR remained below 75 bpm when ambulating around room. Pt was asymptomatic throughout.  Author will return later this date to progress pt towards PLOF if pt is not DC home. HHPT recommended at DC.    Recommendations for follow up therapy are one component of a multi-disciplinary discharge planning process, led by the attending physician.  Recommendations may be updated based on patient status, additional functional criteria and insurance authorization.  Follow Up Recommendations  Home health PT     Assistance Recommended at Discharge Intermittent Supervision/Assistance  Patient can return home with the following A little help with walking and/or transfers;A little help with bathing/dressing/bathroom;Assistance with cooking/housework;Assist for transportation;Help with stairs or ramp for entrance   Equipment Recommendations  BSC/3in1       Precautions / Restrictions Precautions Precautions: Knee;Fall Precaution Booklet Issued: Yes (comment) Restrictions Weight Bearing Restrictions: Yes LLE Weight Bearing: Weight bearing as tolerated     Mobility  Bed Mobility    General bed mobility comments: pt was I'ly getting up from [facility] after BM upon arriving. Educated pt on importance of having assistance for safety    Transfers Overall  transfer level: Modified independent Equipment used: Rolling walker (2 wheels) Transfers: Sit to/from Stand Sit to Stand: Modified independent (Device/Increase time)    General transfer comment: Pt was already standing in the room. Poor safety awareness and slightly impulsive. was able to stand and sit without physical assistance    Ambulation/Gait Ambulation/Gait assistance: Supervision Gait Distance (Feet): 12 Feet Assistive device: Rolling walker (2 wheels) Gait Pattern/deviations: Decreased stance time - left, Antalgic, Step-to pattern Gait velocity: decreased     General Gait Details: Pt was ambulating in room around bed form BSC. Vcs for posture correction and staying inside RW. No LOB however pt has poor safety awareness and is impulsive. HR stable during short gait distances.    Balance Overall balance assessment: Needs assistance Sitting-balance support: No upper extremity supported, Feet supported Sitting balance-Leahy Scale: Good     Standing balance support: Bilateral upper extremity supported Standing balance-Leahy Scale: Good       Cognition Arousal/Alertness: Awake/alert Behavior During Therapy: WFL for tasks assessed/performed Overall Cognitive Status: Within Functional Limits for tasks assessed      General Comments: Pt is A and Eager to DC home        Exercises Total Joint Exercises Goniometric ROM: L knee 93 degrees        Pertinent Vitals/Pain Pain Assessment Pain Assessment: 0-10 Pain Score: 2  Faces Pain Scale: Hurts a little bit Pain Location: L LE Pain Descriptors / Indicators: Operative site guarding, Discomfort Pain Intervention(s): Limited activity within patient's tolerance, Monitored during session, Premedicated before session, Repositioned, Ice applied     PT Goals (current goals can now be found in the care plan section) Acute Rehab PT Goals Patient Stated Goal: To go home Progress towards PT  goals: Progressing toward goals     Frequency    BID      PT Plan Current plan remains appropriate       AM-PAC PT "6 Clicks" Mobility   Outcome Measure  Help needed turning from your back to your side while in a flat bed without using bedrails?: None Help needed moving from lying on your back to sitting on the side of a flat bed without using bedrails?: None Help needed moving to and from a bed to a chair (including a wheelchair)?: None Help needed standing up from a chair using your arms (e.g., wheelchair or bedside chair)?: A Little Help needed to walk in hospital room?: A Little Help needed climbing 3-5 steps with a railing? : A Little 6 Click Score: 21    End of Session   Activity Tolerance: Patient tolerated treatment well Patient left: in chair;with chair alarm set;with SCD's reapplied;Other (comment) Nurse Communication: Mobility status PT Visit Diagnosis: Muscle weakness (generalized) (M62.81);Difficulty in walking, not elsewhere classified (R26.2);Pain Pain - Right/Left: Left Pain - part of body: Knee     Time: 0950-1007 PT Time Calculation (min) (ACUTE ONLY): 17 min  Charges:  $Therapeutic Activity: 8-22 mins                     Julaine Fusi PTA 05/30/22, 10:24 AM

## 2022-05-30 NOTE — Progress Notes (Signed)
  Subjective: 3 Days Post-Op Procedure(s) (LRB): COMPUTER ASSISTED TOTAL KNEE ARTHROPLASTY (Left) Patient reports pain as mild this morning..  Plan is to go Home after hospital stay with HHPT. Negative for chest pain and shortness of breath. Patient is well this morning, did go into A fib with RVR last night, hospitalist has been consulted. HR has improved this morning, HR 65. Fever: no Gastrointestinal: negative for nausea and vomiting.   Patient has had a small BM.  Objective: Vital signs in last 24 hours: Temp:  [97.5 F (36.4 C)-97.7 F (36.5 C)] 97.7 F (36.5 C) (06/03 0545) Pulse Rate:  [64-125] 65 (06/03 0545) Resp:  [16-18] 17 (06/03 0545) BP: (100-117)/(54-80) 114/54 (06/03 0545) SpO2:  [96 %-100 %] 97 % (06/03 0545)  Intake/Output from previous day: No intake or output data in the 24 hours ending 05/30/22 0822   Intake/Output this shift: No intake/output data recorded.  Labs: No results for input(s): HGB in the last 72 hours. No results for input(s): WBC, RBC, HCT, PLT in the last 72 hours. No results for input(s): NA, K, CL, CO2, BUN, CREATININE, GLUCOSE, CALCIUM in the last 72 hours. No results for input(s): LABPT, INR in the last 72 hours.   EXAM General - Patient is Alert, Appropriate, and Oriented Extremity - Neurovascular intact Dorsiflexion/Plantar flexion intact Compartment soft Dressing/Incision -clean, dry, minimal bloody drainage.  Honeycomb dressing is intact. Bone foam in place for the left leg. Motor Function - intact, moving foot and toes well on exam.    Cardiovascular- Regular rate and rhythm, no murmurs/rubs/gallops Respiratory- Lungs clear to auscultation bilaterally Gastrointestinal- soft, nontender, and active bowel sounds  Assessment/Plan: 3 Days Post-Op Procedure(s) (LRB): COMPUTER ASSISTED TOTAL KNEE ARTHROPLASTY (Left) Principal Problem:   Total knee replacement status  Estimated body mass index is 27.44 kg/m as calculated from  the following:   Height as of this encounter: '5\' 6"'$  (1.676 m).   Weight as of this encounter: 77.1 kg. Advance diet Up with therapy  Vitals reviewed this AM, HR 65 and regular this morning. Hospitalist consulted yesterday for A. Fib with RVR.  ECHO has been ordered for the patient.  Patient on eliquis '5mg'$  twice daily. Up with therapy today if tolerated. Patient has meet all goals with therapy, cleared for discharge from a orthopaedic perspective. Will await further recommendations from hospitalist and results from ECHO before planning discharge.  DVT Prophylaxis - Lovenox, Ted hose, and SCDs Weight-Bearing as tolerated to left leg  J. Cameron Proud, PA-C [facility] Orthopaedic Surgery 05/30/2022, 8:22 AM

## 2022-05-30 NOTE — Consult Note (Signed)
Initial Consultation Note   Patient: Beth Rodgers WUJ:[id number] DOB: [birth year] PCP: Ria Bush, MD DOA: 05/27/2022 DOS: the patient was seen and examined on 05/30/2022 Primary service: Dereck Leep, MD  Referring physician: Dr. [name] Reason for consult: New onset A-fib with RVR  Assessment/Plan: Assessment and Plan: No notes have been filed under this hospital service. Service: Hospitalist  #1 new onset atrial fibrillation with rapid ventricular response:  Paitent is cardiac aware, she notes prior episodes of fluttering HR, but denies previous evaluatoin. Brother with established Afib. HR sinus rhythm 60-80s, Hemodynamically stable. 6/3 TTE EF 55%, without wall motion abnormalities noted, no evidence of cardiomyopathy.  No CP/Anginal symptoms. Continue Eliquis given age, comorbidities and new diagnosis. Discussed risks of initiating blood thinners and worsened morbidity with injuries related to trauma/falls. Will send referral for outpatient Cardiology appointment. Touchpoint with PCP in 1 week to evaluate Rx tolerance, hemodynamics F/u with Cardiology in 2 weeks for further evaluation.   #2 status post left total knee replacement: Defer to orthopedics  #3 essential hypertension: Blood pressure appears well controlled.  Continue monitoring  #4 GERD: Continue with PPIs   TRH will continue to follow the patient.  HPI: Beth Rodgers is a 87 y.o. female with past medical history of essential hypertension, hyperlipidemia, anxiety disorder among other things, hypothyroidism as well as lymphedema who was admitted for left total knee replacement.  Patient had successful surgery and was being discharged home today.  Her vitals showed tachycardia.  Subsequent EKG showed atrial fibrillation with rapid ventricular response.  Patient has no chest pain.  No prior cardiac history.  Denied any other complaints.  Due to patient's age of onset and recent family history of cardiac disease  patient is being evaluated in consultation for this new onset A-fib with RVR.Marland Kitchen  Review of Systems: As mentioned in the history of present illness. All other systems reviewed and are negative. Past Medical History:  Diagnosis Date   Basal cell carcinoma (BCC)    txted with MOHs in past   Cellulitis 08/10/2018   Chronic venous insufficiency    with varicose veins   Family history of breast cancer    Family history of thyroid cancer    GERD (gastroesophageal reflux disease)    Glaucoma    History  of basal cell carcinoma    left nare/BREAST CANCER   History of chicken pox    Hypertension    Hypoglycemia    Hypothyroid    Lymphedema    LE   Osteoarthritis    back pain, L knee pain   Osteopenia 06/2009   DEXA 11/2015: T -2.3 hip, -2.2 spine, on longterm bisphosphonate/evista   Psoriasis    Pyogenic granuloma 04/16/2016   Past Surgical History:  Procedure Laterality Date   BASAL CELL CARCINOMA EXCISION     located on face   BREAST BIOPSY Right    core done ing [place]?   BREAST BIOPSY Left 08/16/2020   3 area bx 4:00 X, IMC, 6:00Q IMC, LN hydro #3-benign   BREAST LUMPECTOMY Left 09/11/2020   two areas of [facility]   CATARACT EXTRACTION Bilateral    bilateral   DEXA  06/2009   T score Spine: -1.8, hip -2.0   EXCISION OF BREAST BIOPSY Left 09/11/2020   Procedure: EXCISION OF BREAST BIOPSY;  Surgeon: [name], MD;  Location: [facility] ORS;  Service: General;  Laterality: Left;   Foam sclerotherapy Bilateral 09/2015   [place], [place]   KNEE ARTHROPLASTY Left 05/27/2022  Procedure: COMPUTER ASSISTED TOTAL KNEE ARTHROPLASTY;  Surgeon: Dereck Leep, MD;  Location: [facility] ORS;  Service: Orthopedics;  Laterality: Left;   MASTECTOMY W/ SENTINEL NODE BIOPSY Left 06/25/2021   Procedure: MASTECTOMY WITH SENTINEL LYMPH NODE BIOPSY;  Surgeon: [name], MD;  Location: [facility] ORS;  Service: General;  Laterality: Left;   NASAL RECONSTRUCTION  2005   for [place] L nare s/p  Mohs   nuclear stress test  2014   normal in [place] BX Left 09/11/2020   Procedure: PARTIAL MASTECTOMY WITH NEEDLE LOCALIZATION AND AXILLARY SENTINEL LYMPH NODE BX;  Surgeon: [name], MD;  Location: [facility] ORS;  Service: General;  Laterality: Left;   RADIOFREQUENCY ABLATION Left 01/2012   remnant L G saphenous vein below knee   RADIOFREQUENCY ABLATION Right 02/2013   RLE vein ablation    VEIN LIGATION AND STRIPPING  remote   bilateral G saphenous veins   Social History:  reports that she has never smoked. She has never used smokeless tobacco. She reports current alcohol use. She reports that she does not use drugs.  Allergies  Allergen Reactions   Anastrozole Other (See Comments), Itching and Rash    Back pain  Back pain  Back pain    Exemestane Rash    Pain in right hand  Pain in right hand  Pain in right hand    Naproxen Hives    Had bumps break out on the back  Had bumps break out on the back  Had bumps break out on the back  Had bumps break out on the back  Had bumps break out on the back     Family History  Problem Relation Age of Onset   Cancer Mother        thyroid cancer   Heart disease Father        CHF   Diabetes Father    Glaucoma Father    Arthritis Father    Coronary artery disease Maternal Uncle    Bipolar disorder Son    Heart disease Sister        CHF   Stroke Sister    Brain cancer Grandson        astrocytoma   Breast cancer Cousin        dx 26s    Prior to Admission medications   Medication Sig Start Date End Date Taking? Authorizing Provider  Calcium Carb-Cholecalciferol (CALCIUM 600 + D PO) Take 1 tablet by mouth daily. Ca 500 + Vit D 800   Yes [provider]  Ciclopirox 0.77 % gel Apply 1 application topically 2 (two) times daily as needed (fungus).   Yes [provider]  clobetasol cream (TEMOVATE) 3.32 % Apply 1 application  topically as directed. Qd to bid up to 5 days a week to aa psoriasis on body until clear, then prn flares, avoid face, groin, axilla 03/10/21  Yes [name], MD  diclofenac sodium (VOLTAREN) 1 % GEL Apply 1 application topically 3 (three) times daily. Patient taking differently: Apply 1 application. topically 3 (three) times daily as needed (pain). 05/29/19  Yes Ria Bush, MD  enoxaparin (LOVENOX) 40 MG/0.4ML injection Inject 0.4 mLs (40 mg total) into the skin daily for 14 days. 05/28/22 06/11/22 Yes Tamala Julian B, PA-C  latanoprost (XALATAN) 0.005 % ophthalmic solution latanoprost 0.005 % eye drops  PLACE 1 DROP INTO BOTH EYES ONCE EVERY EVENING 11/02/21  Yes [provider]  levothyroxine (SYNTHROID) 75 MCG tablet Take 1 tablet (75 mcg total) by mouth daily. Patient taking differently: Take 75 mcg by mouth daily before breakfast. 06/24/21  Yes Ria Bush, MD  losartan (COZAAR) 100 MG tablet TAKE 1 TABLET BY MOUTH EVERY DAY Patient taking differently: Take 100 mg by mouth at bedtime. 08/26/21  Yes Ria Bush, MD  Magnesium 400 MG TABS Take 400 mg by mouth daily.   Yes [provider]  Multiple Vitamin (MULTIVITAMIN) tablet Take 1 tablet by mouth daily.   Yes [provider]  Roflumilast (ZORYVE) 0.3 % CREA Apply 1 application. topically every other day. Sample   Yes [provider]  SELENIUM PO Take 1 tablet by mouth daily.   Yes [provider]  silver sulfADIAZINE (SILVADENE) 1 % cream Apply 1 application topically daily. Patient taking differently: Apply 1 application. topically as needed. 12/15/21  Yes Schnier, Dolores Lory, MD  tacrolimus (PROTOPIC) 0.1 % ointment APPLY TOPICALLY TO AFFECTED AREA(S) TWICE DAILY Patient taking differently: 1 application. as needed. 06/16/21  Yes [name], MD  vitamin C (ASCORBIC ACID) 500 MG tablet Take 500 mg by mouth daily.   Yes [provider]  vitamin E 180 MG (400 UNITS)  capsule Take 400 Units by mouth daily.   Yes [provider]  alendronate (FOSAMAX) 70 MG tablet Take 1 tablet (70 mg total) by mouth every Sunday. 12/28/21   Ria Bush, MD  calcium carbonate (TUMS EX) 750 MG chewable tablet Chew 750 mg by mouth daily as needed for heartburn. Patient not taking: Reported on 05/15/2022    [provider]  celecoxib (CELEBREX) 200 MG capsule Take 200 mg by mouth daily. 11/13/21   [provider]  celecoxib (CELEBREX) 200 MG capsule Take 1 capsule (200 mg total) by mouth 2 (two) times daily. 05/28/22   Tamala Julian B, PA-C  ipratropium (ATROVENT) 0.06 % nasal spray Place 2 sprays into both nostrils every 8 (eight) hours as needed for allergies. Patient not taking: Reported on 05/15/2022 03/12/22   [provider]  oxyCODONE (OXY IR/ROXICODONE) 5 MG immediate release tablet Take 1 tablet (5 mg total) by mouth every 4 (four) hours as needed for severe pain. 05/28/22   Fausto Skillern, PA-C  traMADol (ULTRAM) 50 MG tablet Take 1 tablet by mouth every 6 (six) hours as needed for pain. Patient not taking: Reported on 05/27/2022    [provider]  traMADol (ULTRAM) 50 MG tablet Take 1 tablet (50 mg total) by mouth every 4 (four) hours as needed for moderate pain. 05/28/22   Fausto Skillern, PA-C    Physical Exam: Vitals:   05/29/22 1708 05/29/22 2045 05/30/22 0545 05/30/22 1018  BP: 100/80 117/65 (!) 114/54 119/65  Pulse: (!) 118 84 65 75  Resp: '17 17 17 17  '$ Temp:  (!) 97.5 F (36.4 C) 97.7 F (36.5 C) 98 F (36.7 C)  TempSrc:  Oral    SpO2: 96% 98% 97% 97%  Weight:      Height:       General: Stable no acute distress HEENT: Good air entry bilateral no wheeze rales or crackles Neck: Supple, no JVD no lymphadenopathy Cardiovascular: Irregularly irregular with tachycardia Abdomen: Soft, nontender with positive bowel sounds Extremities: Status post left total knee replacement  Data Reviewed:   There are no  new results to review at this time.   Family Communication: Husband and son at bedside Primary team communication: Orthopedic team Thank you very much for  involving [place] in the care of your patient.  Author: Vanna Scotland, MD 05/30/2022 1:10 PM  For on call review [email or web address].

## 2022-05-30 NOTE — Progress Notes (Signed)
*  PRELIMINARY RESULTS* Echocardiogram 2D Echocardiogram has been performed.  Claretta Fraise 05/30/2022, 12:28 PM

## 2022-05-30 NOTE — Progress Notes (Deleted)
PT Cancellation Note  Patient Details Name: Beth Rodgers MRN: [id number] DOB: 1935-11-28   Cancelled Treatment:     Pt having ECHO. PT will continue to follow and progress as able per current POC   Willette Pa 05/30/2022, 10:03 AM

## 2022-05-30 NOTE — Progress Notes (Signed)
Patient has been given verbal and written discharge instructions, she acknowledge understanding. Honey comb dressing was changed with minium drainage noted, and extra dressing was given to patient.Patient was instructed to return to doctor if she has a fever of 100.4, has foul drainage ,and odor from surgical site. Patient is waiting on ride to go home.

## 2022-05-30 NOTE — Plan of Care (Signed)
  Problem: Education: Goal: Knowledge of the prescribed therapeutic regimen will improve Outcome: Progressing   Problem: Activity: Goal: Ability to avoid complications of mobility impairment will improve Outcome: Progressing Goal: Range of joint motion will improve Outcome: Progressing   Problem: Clinical Measurements: Goal: Postoperative complications will be avoided or minimized Outcome: Progressing   Problem: Pain Management: Goal: Pain level will decrease with appropriate interventions Outcome: Progressing   

## 2022-05-31 DIAGNOSIS — Z96652 Presence of left artificial knee joint: Secondary | ICD-10-CM | POA: Diagnosis not present

## 2022-05-31 DIAGNOSIS — M47817 Spondylosis without myelopathy or radiculopathy, lumbosacral region: Secondary | ICD-10-CM | POA: Diagnosis not present

## 2022-05-31 DIAGNOSIS — I10 Essential (primary) hypertension: Secondary | ICD-10-CM | POA: Diagnosis not present

## 2022-05-31 DIAGNOSIS — F4321 Adjustment disorder with depressed mood: Secondary | ICD-10-CM | POA: Diagnosis not present

## 2022-05-31 DIAGNOSIS — H409 Unspecified glaucoma: Secondary | ICD-10-CM | POA: Diagnosis not present

## 2022-05-31 DIAGNOSIS — L84 Corns and callosities: Secondary | ICD-10-CM | POA: Diagnosis not present

## 2022-05-31 DIAGNOSIS — Z853 Personal history of malignant neoplasm of breast: Secondary | ICD-10-CM | POA: Diagnosis not present

## 2022-05-31 DIAGNOSIS — E559 Vitamin D deficiency, unspecified: Secondary | ICD-10-CM | POA: Diagnosis not present

## 2022-05-31 DIAGNOSIS — M8580 Other specified disorders of bone density and structure, unspecified site: Secondary | ICD-10-CM | POA: Diagnosis not present

## 2022-05-31 DIAGNOSIS — I890 Lymphedema, not elsewhere classified: Secondary | ICD-10-CM | POA: Diagnosis not present

## 2022-05-31 DIAGNOSIS — M7662 Achilles tendinitis, left leg: Secondary | ICD-10-CM | POA: Diagnosis not present

## 2022-05-31 DIAGNOSIS — Z85828 Personal history of other malignant neoplasm of skin: Secondary | ICD-10-CM | POA: Diagnosis not present

## 2022-05-31 DIAGNOSIS — M1712 Unilateral primary osteoarthritis, left knee: Secondary | ICD-10-CM | POA: Diagnosis not present

## 2022-05-31 DIAGNOSIS — R1312 Dysphagia, oropharyngeal phase: Secondary | ICD-10-CM | POA: Diagnosis not present

## 2022-05-31 DIAGNOSIS — M542 Cervicalgia: Secondary | ICD-10-CM | POA: Diagnosis not present

## 2022-05-31 DIAGNOSIS — I83813 Varicose veins of bilateral lower extremities with pain: Secondary | ICD-10-CM | POA: Diagnosis not present

## 2022-05-31 DIAGNOSIS — Z471 Aftercare following joint replacement surgery: Secondary | ICD-10-CM | POA: Diagnosis not present

## 2022-05-31 DIAGNOSIS — I77819 Aortic ectasia, unspecified site: Secondary | ICD-10-CM | POA: Diagnosis not present

## 2022-05-31 DIAGNOSIS — Z791 Long term (current) use of non-steroidal anti-inflammatories (NSAID): Secondary | ICD-10-CM | POA: Diagnosis not present

## 2022-05-31 DIAGNOSIS — L309 Dermatitis, unspecified: Secondary | ICD-10-CM | POA: Diagnosis not present

## 2022-05-31 DIAGNOSIS — Z7901 Long term (current) use of anticoagulants: Secondary | ICD-10-CM | POA: Diagnosis not present

## 2022-05-31 DIAGNOSIS — Z86018 Personal history of other benign neoplasm: Secondary | ICD-10-CM | POA: Diagnosis not present

## 2022-06-01 ENCOUNTER — Encounter: Payer: Self-pay | Admitting: Family Medicine

## 2022-06-01 ENCOUNTER — Telehealth: Payer: Self-pay | Admitting: Cardiovascular Disease

## 2022-06-01 ENCOUNTER — Telehealth: Payer: Self-pay | Admitting: Family Medicine

## 2022-06-01 ENCOUNTER — Telehealth (INDEPENDENT_AMBULATORY_CARE_PROVIDER_SITE_OTHER): Payer: Medicare Other | Admitting: Family Medicine

## 2022-06-01 VITALS — Temp 97.0°F | Ht 66.0 in | Wt 167.0 lb

## 2022-06-01 DIAGNOSIS — Z96652 Presence of left artificial knee joint: Secondary | ICD-10-CM

## 2022-06-01 DIAGNOSIS — E039 Hypothyroidism, unspecified: Secondary | ICD-10-CM | POA: Diagnosis not present

## 2022-06-01 DIAGNOSIS — I4891 Unspecified atrial fibrillation: Secondary | ICD-10-CM | POA: Diagnosis not present

## 2022-06-01 DIAGNOSIS — K5900 Constipation, unspecified: Secondary | ICD-10-CM | POA: Insufficient documentation

## 2022-06-01 MED ORDER — POLYETHYLENE GLYCOL 3350 17 GM/SCOOP PO POWD: 8.5000 g | Freq: Every day | ORAL | 0 refills | Status: DC | PRN

## 2022-06-01 NOTE — Assessment & Plan Note (Addendum)
Discussed fluids and fiber in diet.  Recommend start miralax course 1/2-1 capful daily, update with effect.  Discussed sennakot use.  Red flags to seek urgent care reviewed.

## 2022-06-01 NOTE — Telephone Encounter (Signed)
Spoke with pt for clarification of message.  States she finally had a small BM [2 golf ball size], still very hard. She was asking if she should still take both Dulcolax and Miralax.  Suggested she take Dulcolax stool softener first, then start Miralax, explaining the stool softener will get started softening the stool and the Miralax will then get things moving.  Pt verbalizes understanding and expresses her thanks.

## 2022-06-01 NOTE — Telephone Encounter (Signed)
Pt c/o medication issue:  1. Name of Medication: new meds since hospital visit new onset afib  2. How are you currently taking this medication (dosage and times per day)? Please advise   3. Are you having a reaction (difficulty breathing--STAT)? no  4. What is your medication issue? Patient scheduled for hospital fu visit with [place] new onset afib and wants advise on results/ meds started at hospital dc .   She was sent home without really knowing what to do.

## 2022-06-01 NOTE — Progress Notes (Signed)
Patient ID: Beth Rodgers, female    DOB: [birth year], 87 y.o.   MRN: [id number]  Virtual visit completed through [employer], a video enabled telemedicine application. Due to national recommendations of social distancing due to COVID-19, a virtual visit is felt to be most appropriate for this patient at this time. Reviewed limitations, risks, security and privacy concerns of performing a virtual visit and the availability of in person appointments. I also reviewed that there may be a patient responsible charge related to this service. The patient agreed to proceed.   Patient location: home Provider location: [place] at [facility], office Persons participating in this virtual visit: patient, provider   If any vitals were documented, they were collected by patient at home unless specified below.    Temp (!) 97 F (36.1 C)   Ht '5\' 6"'$  (1.676 m)   Wt 167 lb (75.8 kg)   BMI 26.95 kg/m    CC: constipation Subjective:   HPI: Beth Rodgers is a 87 y.o. female presenting on 06/01/2022 for Constipation (C/o constipation. Last BM 05/26/22- day before knee surgery. Pt was given enema after surgery on 05/27/22, not helpful. Pt was able to manually )   Recent left total knee replacement by Dr. [name] performed May 31, hospitalization complicated by atrial fibrillation with RVR, hospitalist and cardiology team involved.  Patient was started on Eliquis 5 mg twice daily, discharged on lower dose losartan plus new addition of metoprolol.  Planned outpatient cardiology follow-up. She never felt afib.  Echocardiogram 05/30/2022: EF 55-60%, no regional wall motion abnormalities, mildly enlarged RV, mild calcified aortic valve without stenosis, mild AR, RA pressure 8 mmHg.  She has previously seen Dr Rockey Situ, requests return to him if able.  She is receiving HHPT.   Since discharge home she notes ongoing difficulty with constipation, in setting of recent surgery and tramadol use for pain (last dose was yesterday).   Last bowel movement was 5/30, even after enema used 5/31. She also has tried MOM and increased fiber in diet without benefit.  Now she is only managing pain with tylenol.   Finally 30 min ago she was able to have 1 hard ball shaped stool with some relief.   No fever, chills, abd pain, nausea/vomiting, she did pass gas in hospital but not too much since.   She has dulcolax at home.  She is staying well hydrated and getting plenty of fiber in her diet.      Relevant past medical, surgical, family and social history reviewed and updated as indicated. Interim medical history since our last visit reviewed. Allergies and medications reviewed and updated. Outpatient Medications Prior to Visit  Medication Sig Dispense Refill   alendronate (FOSAMAX) 70 MG tablet Take 1 tablet (70 mg total) by mouth every Sunday. 12 tablet 3   apixaban (ELIQUIS) 5 MG TABS tablet Take 1 tablet (5 mg total) by mouth 2 (two) times daily. 60 tablet 01   Calcium Carb-Cholecalciferol (CALCIUM 600 + D PO) Take 1 tablet by mouth daily. Ca 500 + Vit D 800     celecoxib (CELEBREX) 200 MG capsule Take 1 capsule (200 mg total) by mouth 2 (two) times daily. 90 capsule 0   Ciclopirox 0.77 % gel Apply 1 application topically 2 (two) times daily as needed (fungus).     clobetasol cream (TEMOVATE) 5.95 % Apply 1 application topically as directed. Qd to bid up to 5 days a week to aa psoriasis on body until clear, then prn  flares, avoid face, groin, axilla 60 g 2   diclofenac sodium (VOLTAREN) 1 % GEL Apply 1 application topically 3 (three) times daily. (Patient taking differently: Apply 1 application. topically 3 (three) times daily as needed (pain).) 1 Tube 0   latanoprost (XALATAN) 0.005 % ophthalmic solution latanoprost 0.005 % eye drops  PLACE 1 DROP INTO BOTH EYES ONCE EVERY EVENING     levothyroxine (SYNTHROID) 75 MCG tablet Take 1 tablet (75 mcg total) by mouth daily. (Patient taking differently: Take 75 mcg by mouth daily before  breakfast.) 90 tablet 3   losartan (COZAAR) 50 MG tablet Take 1 tablet (50 mg total) by mouth daily. 30 tablet 0   Magnesium 400 MG TABS Take 400 mg by mouth daily.     metoprolol tartrate (LOPRESSOR) 25 MG tablet Take 0.5 tablets (12.5 mg total) by mouth 2 (two) times daily. 30 tablet 0   Multiple Vitamin (MULTIVITAMIN) tablet Take 1 tablet by mouth daily.     Roflumilast (ZORYVE) 0.3 % CREA Apply 1 application. topically every other day. Sample     SELENIUM PO Take 1 tablet by mouth daily.     silver sulfADIAZINE (SILVADENE) 1 % cream Apply 1 application topically daily. (Patient taking differently: Apply 1 application. topically as needed.) 50 g 4   tacrolimus (PROTOPIC) 0.1 % ointment APPLY TOPICALLY TO AFFECTED AREA(S) TWICE DAILY (Patient taking differently: 1 application. as needed.) 30 g 0   vitamin C (ASCORBIC ACID) 500 MG tablet Take 500 mg by mouth daily.     vitamin E 180 MG (400 UNITS) capsule Take 400 Units by mouth daily.     traMADol (ULTRAM) 50 MG tablet Take 1 tablet (50 mg total) by mouth every 4 (four) hours as needed for moderate pain. (Patient not taking: Reported on 06/01/2022) 30 tablet 0   calcium carbonate (TUMS EX) 750 MG chewable tablet Chew 750 mg by mouth daily as needed for heartburn. (Patient not taking: Reported on 05/15/2022)     ipratropium (ATROVENT) 0.06 % nasal spray Place 2 sprays into both nostrils every 8 (eight) hours as needed for allergies.     oxyCODONE (OXY IR/ROXICODONE) 5 MG immediate release tablet Take 1 tablet (5 mg total) by mouth every 4 (four) hours as needed for severe pain. 30 tablet 0   traMADol (ULTRAM) 50 MG tablet Take 1 tablet by mouth every 6 (six) hours as needed for pain. (Patient not taking: Reported on 05/27/2022)     No facility-administered medications prior to visit.     Per HPI unless specifically indicated in ROS section below Review of Systems Objective:  Temp (!) 97 F (36.1 C)   Ht '5\' 6"'$  (1.676 m)   Wt 167 lb (75.8 kg)    BMI 26.95 kg/m   Wt Readings from Last 3 Encounters:  06/01/22 167 lb (75.8 kg)  05/27/22 170 lb (77.1 kg)  05/15/22 172 lb (78 kg)       Physical exam: Gen: alert, NAD, not ill appearing Pulm: speaks in complete sentences without increased work of breathing Psych: normal mood, normal thought content      Lab Results  Component Value Date   TSH 2.44 06/02/2021    Assessment & Plan:   Problem List Items Addressed This Visit     Hypothyroidism    Overdue for checking -I did ask her to return in next few weeks for lab visit only.        Total knee replacement status    Recovering  remarkably well after recent surgery Appreciate ortho care.  She is receiving HHPT.        Constipation - Primary    Discussed fluids and fiber in diet.  Recommend start miralax course 1/2-1 capful daily, update with effect.  Discussed sennakot use.  Red flags to seek urgent care reviewed.        Atrial fibrillation ([place])    Isolated post-op - now on eliquis and metoprolol 12.'5mg'$  bid.  Will refer to cards Rockey Situ) per pt request.  No recent thyroid function - will need this updated, ordered.        Relevant Orders   Ambulatory referral to Cardiology     Meds ordered this encounter  Medications   polyethylene glycol powder (GLYCOLAX/MIRALAX) 17 GM/SCOOP powder    Sig: Take 8.5-17 g by mouth daily as needed for moderate constipation.    Dispense:  3350 g    Refill:  0   Orders Placed This Encounter  Procedures   Ambulatory referral to Cardiology    Referral Priority:   Routine    Referral Type:   Consultation    Referral Reason:   Specialty Services Required    Requested Specialty:   Cardiology    Number of Visits Requested:   1    I discussed the assessment and treatment plan with the patient. The patient was provided an opportunity to ask questions and all were answered. The patient agreed with the plan and demonstrated an understanding of the instructions. The patient was  advised to call back or seek an in-person evaluation if the symptoms worsen or if the condition fails to improve as anticipated.  Follow up plan: No follow-ups on file.  Ria Bush, MD

## 2022-06-01 NOTE — Assessment & Plan Note (Addendum)
Isolated post-op - now on eliquis and metoprolol 12.'5mg'$  bid.  Will refer to cards Rockey Situ) per pt request.  No recent thyroid function - will need this updated, ordered.

## 2022-06-01 NOTE — Assessment & Plan Note (Addendum)
Recovering remarkably well after recent surgery Appreciate ortho care.  She is receiving HHPT.

## 2022-06-01 NOTE — Assessment & Plan Note (Signed)
Overdue for checking -I did ask her to return in next few weeks for lab visit only.

## 2022-06-01 NOTE — Telephone Encounter (Signed)
Pt called and said that are 2 stool softeners that Dr Darnell Level prescribed today and she said her condition has improved and she wasn't sure if she still needed to take both or just 1. Call back is [phone]

## 2022-06-02 ENCOUNTER — Telehealth: Payer: Self-pay

## 2022-06-02 DIAGNOSIS — I77819 Aortic ectasia, unspecified site: Secondary | ICD-10-CM | POA: Diagnosis not present

## 2022-06-02 DIAGNOSIS — H409 Unspecified glaucoma: Secondary | ICD-10-CM | POA: Diagnosis not present

## 2022-06-02 DIAGNOSIS — Z471 Aftercare following joint replacement surgery: Secondary | ICD-10-CM | POA: Diagnosis not present

## 2022-06-02 DIAGNOSIS — M7662 Achilles tendinitis, left leg: Secondary | ICD-10-CM | POA: Diagnosis not present

## 2022-06-02 DIAGNOSIS — I10 Essential (primary) hypertension: Secondary | ICD-10-CM | POA: Diagnosis not present

## 2022-06-02 DIAGNOSIS — M8580 Other specified disorders of bone density and structure, unspecified site: Secondary | ICD-10-CM | POA: Diagnosis not present

## 2022-06-02 NOTE — Telephone Encounter (Signed)
Patient states that the dulcolax made her feel better, and is having regular bowel movements.

## 2022-06-02 NOTE — Chronic Care Management (AMB) (Signed)
Chronic Care Management Pharmacy Assistant   Name: Beth Rodgers  MRN: [id number] DOB: September 27, 1935  Reason for Encounter: Reminder Call   Conditions to be addressed/monitored: HTN  Medications: Outpatient Encounter Medications as of 06/02/2022  Medication Sig Note   alendronate (FOSAMAX) 70 MG tablet Take 1 tablet (70 mg total) by mouth every Sunday.    apixaban (ELIQUIS) 5 MG TABS tablet Take 1 tablet (5 mg total) by mouth 2 (two) times daily.    Calcium Carb-Cholecalciferol (CALCIUM 600 + D PO) Take 1 tablet by mouth daily. Ca 500 + Vit D 800    celecoxib (CELEBREX) 200 MG capsule Take 1 capsule (200 mg total) by mouth 2 (two) times daily.    Ciclopirox 0.77 % gel Apply 1 application topically 2 (two) times daily as needed (fungus).    clobetasol cream (TEMOVATE) 8.54 % Apply 1 application topically as directed. Qd to bid up to 5 days a week to aa psoriasis on body until clear, then prn flares, avoid face, groin, axilla 05/12/2022: Taking twice a day every day? Last filled June 2022   diclofenac sodium (VOLTAREN) 1 % GEL Apply 1 application topically 3 (three) times daily. (Patient taking differently: Apply 1 application. topically 3 (three) times daily as needed (pain).)    latanoprost (XALATAN) 0.005 % ophthalmic solution latanoprost 0.005 % eye drops  PLACE 1 DROP INTO BOTH EYES ONCE EVERY EVENING    levothyroxine (SYNTHROID) 75 MCG tablet Take 1 tablet (75 mcg total) by mouth daily. (Patient taking differently: Take 75 mcg by mouth daily before breakfast.)    losartan (COZAAR) 50 MG tablet Take 1 tablet (50 mg total) by mouth daily.    Magnesium 400 MG TABS Take 400 mg by mouth daily.    metoprolol tartrate (LOPRESSOR) 25 MG tablet Take 0.5 tablets (12.5 mg total) by mouth 2 (two) times daily.    Multiple Vitamin (MULTIVITAMIN) tablet Take 1 tablet by mouth daily.    polyethylene glycol powder (GLYCOLAX/MIRALAX) 17 GM/SCOOP powder Take 8.5-17 g by mouth daily as needed for moderate  constipation.    Roflumilast (ZORYVE) 0.3 % CREA Apply 1 application. topically every other day. Sample    SELENIUM PO Take 1 tablet by mouth daily.    silver sulfADIAZINE (SILVADENE) 1 % cream Apply 1 application topically daily. (Patient taking differently: Apply 1 application. topically as needed.)    tacrolimus (PROTOPIC) 0.1 % ointment APPLY TOPICALLY TO AFFECTED AREA(S) TWICE DAILY (Patient taking differently: 1 application. as needed.)    traMADol (ULTRAM) 50 MG tablet Take 1 tablet (50 mg total) by mouth every 4 (four) hours as needed for moderate pain. (Patient not taking: Reported on 06/01/2022)    vitamin C (ASCORBIC ACID) 500 MG tablet Take 500 mg by mouth daily.    vitamin E 180 MG (400 UNITS) capsule Take 400 Units by mouth daily.    No facility-administered encounter medications on file as of 06/02/2022.   Beth Rodgers was contacted to remind of upcoming telephone visit with Charlene Brooke on 06/05/22 at 11:00am. Patient was reminded to have any blood glucose and blood pressure readings available for review at appointment.   Patient confirmed appointment.   Are you having any problems with your medications? No   Do you have any concerns you like to discuss with the pharmacist? No  The patient is post op left knee surgery at [facility] clinic. She is having some swelling in the left leg. PT is coming today and she will  show her concern to them.  CCM referral has been placed prior to visit?  No    Star Rating Drugs: Medication:  Last Fill: Day Supply Losartan '50mg'$  05/30/22  [place], CPP notified  Avel Sensor, [place]  [phone]

## 2022-06-02 NOTE — Telephone Encounter (Signed)
Glad to hear this . Thank you.

## 2022-06-02 NOTE — Progress Notes (Unsigned)
Cardiology Office Note  Date:  06/03/2022   ID:  Beth Rodgers, DOB 19-Sep-1935, MRN [id number]  PCP:  Ria Bush, MD   Chief Complaint  Patient presents with   Atrial Fibrillation    Dr. Danise Mina for follow-up of her shortness of breath and chest discomfort, atrial fibrillation postoperatively a total left knee surgery June 2023. Medications reviewed by the patient verbally.     HPI:  Ms. Safina Huard is a pleasant 87 year old woman with past medical history of Hypertension Deconditioning Hypothyroidism Gait instability Who presents for follow-up of her shortness of breath and chest discomfort, atrial fibrillation postoperatively June 2023  Last seen in clinic by myself June 2021 hospital admission beginning of June 2023 for left total knee arthroplasty sustained tachycardia POD#2.  EKG showed A-fib with RVR, and a consult was placed to the hospitalist on 05/29/22.  The hospitalist team obtained an ECHO and started the patient on Eliquis '5mg'$  twice daily.  The patient was discharged home on 05/30/22 on Losartan, Metoprolol and Eliquis.  Was in normal sinus rhythm at the time of discharge based on review of echocardiogram June 3  Echocardiogram  IMPRESSIONS  1. Left ventricular ejection fraction, by estimation, is 55 to 60%. The left ventricle has normal function. The left ventricle has no regional wall motion abnormalities. Left ventricular diastolic parameters were normal.  2. Right ventricular systolic function is normal. The right ventricular size is mildly enlarged. Tricuspid regurgitation signal is inadequate for assessing PA pressure.  3. The mitral valve is grossly normal. Trivial mitral valve regurgitation. No evidence of mitral stenosis.  4. The aortic valve is tricuspid. There is mild calcification of the aortic valve. There is mild thickening of the aortic valve. Aortic valve regurgitation is mild. Aortic valve sclerosis/calcification is present, without any evidence of aortic  stenosis.  5. The inferior vena cava is dilated in size with >50% respiratory variability, suggesting right atrial pressure of 8 mmHg.  Atrial fibrillatio noted 05/29/22 D/c on 05/30/2022, was in NSR, noted on echo In follow up today, she denies any tachypalpitations, she has been monitoring blood pressure and heart rate at home  Knee swelling worse starting yesterday, has been working with PT Echymotic bruising tracking up the medial thigh, worse in past 24 to 48 hrs She has been using her lymphedema compression pump, icing  Reports that before leaving the hospital she was very anxious, thinks this might of contributed to her atrial fibrillation  EKG personally reviewed by myself on todays visit Normal sinus rhythm rate 60 bpm no significant ST-T wave changes   Father died 91 with smoker,  Cardiomyopathy? Mother died 6, non cardiac  PMH:   has a past medical history of Basal cell carcinoma (BCC), Cellulitis (08/10/2018), Chronic venous insufficiency, Family history of breast cancer, Family history of thyroid cancer, GERD (gastroesophageal reflux disease), Glaucoma, History  of basal cell carcinoma, History of chicken pox, Hypertension, Hypoglycemia, Hypothyroid, Lymphedema, Osteoarthritis, Osteopenia (06/2009), Psoriasis, and Pyogenic granuloma (04/16/2016).  PSH:    Past Surgical History:  Procedure Laterality Date   BASAL CELL CARCINOMA EXCISION     located on face   BREAST BIOPSY Right    core done ing [place]?   BREAST BIOPSY Left 08/16/2020   3 area bx 4:00 X, IMC, 6:00Q IMC, LN hydro #3-benign   BREAST LUMPECTOMY Left 09/11/2020   two areas of [facility]   CATARACT EXTRACTION Bilateral    bilateral   DEXA  06/2009   T score Spine: -1.8, hip -  2.0   EXCISION OF BREAST BIOPSY Left 09/11/2020   Procedure: EXCISION OF BREAST BIOPSY;  Surgeon: [name], MD;  Location: [facility] ORS;  Service: General;  Laterality: Left;   Foam sclerotherapy Bilateral 09/2015   Reed Pandy,  [place]   KNEE ARTHROPLASTY Left 05/27/2022   Procedure: COMPUTER ASSISTED TOTAL KNEE ARTHROPLASTY;  Surgeon: Dereck Leep, MD;  Location: [facility] ORS;  Service: Orthopedics;  Laterality: Left;   MASTECTOMY W/ SENTINEL NODE BIOPSY Left 06/25/2021   Procedure: MASTECTOMY WITH SENTINEL LYMPH NODE BIOPSY;  Surgeon: [name], MD;  Location: [facility] ORS;  Service: General;  Laterality: Left;   NASAL RECONSTRUCTION  2005   for [place] L nare s/p Mohs   nuclear stress test  2014   normal in [place] BX Left 09/11/2020   Procedure: PARTIAL MASTECTOMY WITH NEEDLE LOCALIZATION AND AXILLARY SENTINEL LYMPH NODE BX;  Surgeon: [name], MD;  Location: [facility] ORS;  Service: General;  Laterality: Left;   RADIOFREQUENCY ABLATION Left 01/2012   remnant L G saphenous vein below knee   RADIOFREQUENCY ABLATION Right 02/2013   RLE vein ablation    VEIN LIGATION AND STRIPPING  remote   bilateral G saphenous veins    Current Outpatient Medications  Medication Sig Dispense Refill   alendronate (FOSAMAX) 70 MG tablet Take 1 tablet (70 mg total) by mouth every Sunday. 12 tablet 3   apixaban (ELIQUIS) 5 MG TABS tablet Take 1 tablet (5 mg total) by mouth 2 (two) times daily. 60 tablet 01   Calcium Carb-Cholecalciferol (CALCIUM 600 + D PO) Take 1 tablet by mouth daily. Ca 500 + Vit D 800     celecoxib (CELEBREX) 200 MG capsule Take 1 capsule (200 mg total) by mouth 2 (two) times daily. 90 capsule 0   Ciclopirox 0.77 % gel Apply 1 application topically 2 (two) times daily as needed (fungus).     clobetasol cream (TEMOVATE) 1.09 % Apply 1 application topically as directed. Qd to bid up to 5 days a week to aa psoriasis on body until clear, then prn flares, avoid face, groin, axilla 60 g 2   diclofenac sodium (VOLTAREN) 1 % GEL Apply 1 application topically 3 (three) times daily. (Patient taking differently: Apply 1  application. topically 3 (three) times daily as needed (pain).) 1 Tube 0   Docusate Sodium (DSS) 100 MG CAPS Take by mouth as needed.     latanoprost (XALATAN) 0.005 % ophthalmic solution latanoprost 0.005 % eye drops  PLACE 1 DROP INTO BOTH EYES ONCE EVERY EVENING     levothyroxine (SYNTHROID) 75 MCG tablet Take 1 tablet (75 mcg total) by mouth daily. (Patient taking differently: Take 75 mcg by mouth daily before breakfast.) 90 tablet 3   losartan (COZAAR) 50 MG tablet Take 1 tablet (50 mg total) by mouth daily. 30 tablet 0   Magnesium 400 MG TABS Take 400 mg by mouth daily.     metoprolol tartrate (LOPRESSOR) 25 MG tablet Take 0.5 tablets (12.5 mg total) by mouth 2 (two) times daily. 30 tablet 0   Multiple Vitamin (MULTIVITAMIN) tablet Take 1 tablet by mouth daily.     polyethylene glycol powder (GLYCOLAX/MIRALAX) 17 GM/SCOOP powder Take 8.5-17 g by mouth daily as needed for moderate constipation. 3350 g 0   Roflumilast (ZORYVE) 0.3 % CREA Apply 1 application. topically every other day. Sample     SELENIUM PO Take 1 tablet by mouth daily.  silver sulfADIAZINE (SILVADENE) 1 % cream Apply 1 application topically daily. (Patient taking differently: Apply 1 application. topically as needed.) 50 g 4   tacrolimus (PROTOPIC) 0.1 % ointment APPLY TOPICALLY TO AFFECTED AREA(S) TWICE DAILY (Patient taking differently: 1 application. as needed.) 30 g 0   traMADol (ULTRAM) 50 MG tablet Take 1 tablet (50 mg total) by mouth every 4 (four) hours as needed for moderate pain. 30 tablet 0   vitamin C (ASCORBIC ACID) 500 MG tablet Take 500 mg by mouth daily.     vitamin E 180 MG (400 UNITS) capsule Take 400 Units by mouth daily.     No current facility-administered medications for this visit.     Allergies:   Anastrozole, Exemestane, and Naproxen   Social History:  The patient  reports that she has never smoked. She has never used smokeless tobacco. She reports current alcohol use. She reports that she  does not use drugs.   Family History:   family history includes Arthritis in her father; Bipolar disorder in her son; Brain cancer in her grandson; Breast cancer in her cousin; Cancer in her mother; Coronary artery disease in her maternal uncle; Diabetes in her father; Glaucoma in her father; Heart disease in her father and sister; Stroke in her sister.    Review of Systems: Review of Systems  Constitutional: Negative.   HENT: Negative.    Respiratory: Negative.    Cardiovascular: Negative.   Gastrointestinal: Negative.   Musculoskeletal: Negative.        Leg swelling on left with bruising  Neurological: Negative.   Psychiatric/Behavioral: Negative.    All other systems reviewed and are negative.  PHYSICAL EXAM: VS:  BP 120/60 (BP Location: Left Arm, Patient Position: Sitting, Cuff Size: Normal)   Pulse 60   Ht '5\' 6"'$  (1.676 m)   Wt 168 lb (76.2 kg)   SpO2 97%   BMI 27.12 kg/m  , BMI Body mass index is 27.12 kg/m. GEN: Well nourished, well developed, in no acute distress HEENT: normal Neck: no JVD, carotid bruits, or masses Cardiac: RRR; no murmurs, rubs, or gallops,no edema  Respiratory:  clear to auscultation bilaterally, normal work of breathing GI: soft, nontender, nondistended, + BS MS: no deformity or atrophy, Left leg ecchymotic bruising thigh, postsurgical incision left knee Skin: warm and dry, no rash Neuro:  Strength and sensation are intact Psych: euthymic mood, full affect  Recent Labs: 05/15/2022: ALT 16; BUN 22; Creatinine, Ser 0.59; Hemoglobin 13.6; Platelets 226; Potassium 4.2; Sodium 136    Lipid Panel Lab Results  Component Value Date   CHOL 193 02/16/2020   HDL 77.30 02/16/2020   LDLCALC 104 (H) 02/16/2020   TRIG 59.0 02/16/2020      Wt Readings from Last 3 Encounters:  06/03/22 168 lb (76.2 kg)  06/01/22 167 lb (75.8 kg)  05/27/22 170 lb (77.1 kg)       ASSESSMENT AND PLAN:  Problem List Items Addressed This Visit       Cardiology  Problems   Chronic venous insufficiency   Essential hypertension   Other Visit Diagnoses     New onset atrial fibrillation (HCC)    -  Primary   Relevant Orders   LONG TERM MONITOR (3-14 DAYS)   Chest pain, unspecified type       Shortness of breath          Paroxysmal atrial fibrillation Discussed recent events Developed lone episode of atrial fibrillation postoperatively lasting 24 hours or less, recommend  she hold the Eliquis given the significant ecchymotic bruising left thigh.  Dr. [name] aware.  Pictures obtained for his review.  Of concern that she feels this is getting worse past 24 to 48 hours.  Recommend she stay on the metoprolol 12.5 twice daily Daily monitoring of heart rate with a pulse oximeter We will send a Zio monitor in the mail to look at A-fib burden For additional episodes of atrial fibrillation, would likely need restart of her Eliquis  Chest discomfort No recent chest pain symptoms  Shortness of breath Sedentary postoperatively Does not appear to be fluid overloaded, denies significant shortness of breath symptoms  Weakness Prior history of leg weakness Participating with PT   Total encounter time more than 40 minutes  Greater than 50% was spent in counseling and coordination of care with the patient    Signed, Esmond Plants, M.D., Ph.D. [place], [place]

## 2022-06-03 ENCOUNTER — Encounter: Payer: Self-pay | Admitting: Cardiovascular Disease

## 2022-06-03 ENCOUNTER — Telehealth: Payer: Self-pay

## 2022-06-03 ENCOUNTER — Ambulatory Visit (INDEPENDENT_AMBULATORY_CARE_PROVIDER_SITE_OTHER): Payer: Medicare Other | Admitting: Cardiovascular Disease

## 2022-06-03 ENCOUNTER — Ambulatory Visit (INDEPENDENT_AMBULATORY_CARE_PROVIDER_SITE_OTHER): Payer: Medicare Other

## 2022-06-03 VITALS — BP 120/60 | HR 60 | Ht 66.0 in | Wt 168.0 lb

## 2022-06-03 DIAGNOSIS — I10 Essential (primary) hypertension: Secondary | ICD-10-CM | POA: Diagnosis not present

## 2022-06-03 DIAGNOSIS — R0602 Shortness of breath: Secondary | ICD-10-CM | POA: Diagnosis not present

## 2022-06-03 DIAGNOSIS — R079 Chest pain, unspecified: Secondary | ICD-10-CM | POA: Diagnosis not present

## 2022-06-03 DIAGNOSIS — I4891 Unspecified atrial fibrillation: Secondary | ICD-10-CM

## 2022-06-03 DIAGNOSIS — I872 Venous insufficiency (chronic) (peripheral): Secondary | ICD-10-CM

## 2022-06-03 NOTE — Telephone Encounter (Signed)
Transition Care Management Follow-up Telephone Call Date of discharge and from where: 05/30/22 / [facility] How have you been since you were released from the hospital? " Doing more than I though I could. Wasn't taking the pain medication like I should but I am now" Any questions or concerns? Yes Patient reports she is in the doctors office and waiting for the doctor to come in the room.  Patient wanted to answer questions for [facility] but had to end call when doctor came into exam room.  Informed patient she would be called back at another time to complete TOC.  Items Reviewed: Did the pt receive and understand the discharge instructions provided? No " I did not receive paperwork because I was not officially discharged."  Patient confirmed she went home on 05/30/22 Medications obtained and verified?  Other Any new allergies since your discharge?  Dietary orders reviewed?  Do you have support at home?   Home Care and Equipment/Supplies: Were home health services ordered?  If so, what is the name of the agency?   Has the agency set up a time to come to the patient's home?  Were any new equipment or medical supplies ordered?   What is the name of the medical supply agency?  Were you able to get the supplies/equipment?  Do you have any questions related to the use of the equipment or supplies?   Functional Questionnaire: (I = Independent and D = Dependent) ADLs:   Bathing/Dressing-   Meal Prep-   Eating-   Maintaining continence-   Transferring/Ambulation-   Managing Meds-   Follow up appointments reviewed:  PCP Hospital f/u appt confirmed?  Scheduled to see  Specialist Hospital f/u appt confirmed?  Scheduled to see  . Are transportation arrangements needed?  If their condition worsens, is the pt aware to call PCP or go to the Emergency Dept.?  Was the patient provided with contact information for the PCP's office or ED?  Was to pt encouraged to call back with questions or concerns?    Quinn Plowman RN,BSN,CCM RN Case Manager [place]  [phone]

## 2022-06-03 NOTE — Patient Instructions (Addendum)
Medication Instructions:  Hold the eliquis for now  Take extra metoprolol for heart rate >100  If you need a refill on your cardiac medications before your next appointment, please call your pharmacy.   Lab work: No new labs needed  Testing/Procedures:  Your provider has ordered a heart monitor to wear for 14 days. This will be mailed to your home with instructions on placement. Once you have finished the time frame requested, you will return monitor in box provided.     Follow-Up: At [facility], you and your health needs are our priority.  As part of our continuing mission to provide you with exceptional heart care, we have created designated Provider Care Teams.  These Care Teams include your primary Cardiologist (physician) and Advanced Practice Providers (APPs -  Physician Assistants and Nurse Practitioners) who all work together to provide you with the care you need, when you need it.  You will need a follow up appointment in 2 months  Providers on your designated Care Team:   Murray Hodgkins, NP Christell Faith, PA-C Cadence Kathlen Mody, [place]  COVID-19 Vaccine Information can be found at: [email or web address] For questions related to vaccine distribution or appointments, please email [email or web address] or call [phone].

## 2022-06-04 DIAGNOSIS — I10 Essential (primary) hypertension: Secondary | ICD-10-CM | POA: Diagnosis not present

## 2022-06-04 DIAGNOSIS — Z471 Aftercare following joint replacement surgery: Secondary | ICD-10-CM | POA: Diagnosis not present

## 2022-06-04 DIAGNOSIS — M8580 Other specified disorders of bone density and structure, unspecified site: Secondary | ICD-10-CM | POA: Diagnosis not present

## 2022-06-04 DIAGNOSIS — H409 Unspecified glaucoma: Secondary | ICD-10-CM | POA: Diagnosis not present

## 2022-06-04 DIAGNOSIS — I77819 Aortic ectasia, unspecified site: Secondary | ICD-10-CM | POA: Diagnosis not present

## 2022-06-04 DIAGNOSIS — M7662 Achilles tendinitis, left leg: Secondary | ICD-10-CM | POA: Diagnosis not present

## 2022-06-05 ENCOUNTER — Ambulatory Visit: Payer: Medicare Other | Admitting: Pharmacist

## 2022-06-05 DIAGNOSIS — M1712 Unilateral primary osteoarthritis, left knee: Secondary | ICD-10-CM

## 2022-06-05 DIAGNOSIS — I10 Essential (primary) hypertension: Secondary | ICD-10-CM

## 2022-06-05 DIAGNOSIS — E039 Hypothyroidism, unspecified: Secondary | ICD-10-CM

## 2022-06-05 DIAGNOSIS — I4891 Unspecified atrial fibrillation: Secondary | ICD-10-CM

## 2022-06-05 DIAGNOSIS — M85852 Other specified disorders of bone density and structure, left thigh: Secondary | ICD-10-CM

## 2022-06-05 NOTE — Progress Notes (Signed)
Chronic Care Management Pharmacy Note  06/05/2022 Name:  ZAYNAH CHAWLA MRN:  [id number] DOB:  Nov 08, 1935  Summary: CCM F/U visit -Reviewed medications; pt affirms adherence according to med list -New onset Afib: she is holding Eliquis per cardiology and awaiting Zio monitor for further treatment decisions -Pt reports ortho advised baby aspirin since she is off Eliquis; she had aspirin 325 mg at home so started taking 1/2 tablet; given significant bruising with Eliquis and continued use of celecoxib 200 mg BID, aspirin 81 mg is likely the safest option  Recommendations/Changes made from today's visit: -Advised to get Aspirin 81 mg instead of 1/2 of 325 mg -Advised to follow up with cardiology regarding Zio monitor  Plan: -Pharmacist follow up televisit scheduled for 2 months -PCP CPE 06/24/22    Subjective: TAIJAH MACRAE is an 87 y.o. year old female who is a primary patient of Ria Bush, MD.  The CCM team was consulted for assistance with disease management and care coordination needs.    Engaged with patient by telephone for follow up visit in response to provider referral for pharmacy case management and/or care coordination services.   Consent to Services:  The patient was given information about Chronic Care Management services, agreed to services, and gave verbal consent prior to initiation of services.  Please see initial visit note for detailed documentation.   Patient Care Team: Ria Bush, MD as PCP - General (Family Medicine) Rico Junker, RN as Registered Nurse Charlton Haws, [facility] as Pharmacist (Pharmacist)   Recent office visits: 06/01/22 Dr Danise Mina OV: Afib, constipation. Referred to cardiology. D/c oxycodone, refilled tramadol. Start miralax, senokot. Referred to cardiology. RTC few weeks to recheck TSH.  04/22/22 Dr Lorelei Pont OV: knee pain. Encouraged moving forward with TKA.  02/06/22 Dr Darnell Level OV: pre-op exam L TKA. Pt decided to postpone  surgery. 12/17/21 NP Matt Charmian Muff OV: wound check; d/c tramadol (not taking); 10/27/21-PCP-Javier Gutierrez,MD-Patient presented for follow up upper respiratory infection.She's been taking cefdinir 300mg  once daily - discussed correct dosing is BID - Started benzonatate 100mg  take 3 times daily for cough. 10/16/21-Family Medicine-Spencer Copland,MD-Patient presented for left knee injection for pain-Referral for orthopedic surgery (interested in TKA)  Recent consult visits: 06/03/22 Dr Rockey Situ (cardiology): new onset Afib (postop TKA). Hold Eliquis due to significant brusiing. Stay on metoprlol 12.5 mg BID. Sent Zio monitor.  05/19/22 PA Cassell Smiles (ortho surg): preop exam. TKA will be 05/27/22. 05/07/22 NP Eulogio Ditch (vasc surg): lymphedema 04/15/22 Dr [name] Banks (oncology): breast cancer surveillance 04/07/22 PA Cassell Smiles (ortho surg): knee OA. Will move forward with TKA. 01/12/22 Dr Waldron Labs (vasc surg): CVI. No med changes. 01/06/22 Dr Windell Moment (Gen Surgery): lymphedema is not likely cause of symptoms, venous insufficiency is main cause. 12/28/21 Urgent Care - Covid19;  rx'd Paxlovid. 12/26/21 NP Eulogio Ditch (Vasc surger): lymphedema. Referred to OT. 12/15/21 dr Delana Meyer (Vasc surgery): venous ulcer of ankle -  Rx'd Keflex, tramadol 50 mg. 12/15/21 Dr Windell Moment (Gen Surgery) - chest swelling not related to leg swelling. F/u with vascular. 11/18/21-Oncology-Archana Rao,MD-F/u breast cancer. Advised mammogram, follow up 4 months. Cancer hx: Dx 07/2020 (2 masses). S/p Lumpectomy 08/2020. S/p Mastectomy 05/2021. 11/06/21-Benjamin Smith,PA (Ortho) Patient presented for left knee pain.Xrays,referred for knee surgery w/ Dr [name], no medication changes  10/21/21-General Surgery-Edgardo Diaz,MD-Patient presented to discuss surgery. No medication changes 09/19/21-Orthopedics-John Hewitt,MD-Patient presented for left heel pain- no data found 09/15/21-Dermatology-Tara Stewart,MD-Patient presented for follow up rash.Start  Tacrolimus ointment-apply to rash twice daily,may use desonide for few  days. Vanicream for around eyes and face wash.  08/26/21-Dermatology-Tara Stewart,MD-Patient presented for follow up psoriasis.Start Ciclopirox cream to AA QD Start tacrolimus 0.1% ointment clobetasol cream 0.05 % 08/20/21-Vascular Surgery-Fallon Brown,NP-Patient presented for evaluation of circulation in bilateral lower legs. Pain unrelated to vascular causes, may be neuropathy. Referred to neurology for evaluation. 07/25/21-Oncology-Archana Rao,MD-Patient presented for follow up breast cancer. Advised tamoxifen but pt unable to tolerate AI's previously, pt declined due to concern for side effects/QOL. 06/20/21-Oncology-Archana Rao,MD-Patient presented for breast cancer. No medication changes .  Hospital visits: Medication Reconciliation was completed by comparing discharge summary, patient's EMR and Pharmacy list, and upon discussion with patient.  Admitted to the hospital on 05/27/22 due to TKA. Discharge date was 05/30/22. Discharged from [facility].   -new onset Afib postop.   New?Medications Started at [facility] Discharge:?? -started Eliquis 5 mg BID due to Afib -Started metoprolol tartrate 25 mg BID  Medications changed at hospital discharge: -Reduced losartan to 50 mg  Medications that remain the same after Hospital Discharge:??  -All other medications will remain the same.     Objective:  Lab Results  Component Value Date   CREATININE 0.59 05/15/2022   BUN 22 05/15/2022   GFR 76.97 06/02/2021   GFRNONAA >60 05/15/2022   GFRAA >60 08/21/2020   NA 136 05/15/2022   K 4.2 05/15/2022   CALCIUM 8.8 (L) 05/15/2022   CO2 27 05/15/2022   GLUCOSE 91 05/15/2022    Lab Results  Component Value Date/Time   GFR 76.97 06/02/2021 12:27 PM   GFR 76.18 02/16/2020 11:28 AM    Last diabetic Eye exam: No results found for: "HMDIABEYEEXA"  Last diabetic Foot exam: No results found for: "HMDIABFOOTEX"   Lab Results   Component Value Date   CHOL 193 02/16/2020   HDL 77.30 02/16/2020   LDLCALC 104 (H) 02/16/2020   LDLDIRECT 124.9 08/31/2013   TRIG 59.0 02/16/2020   CHOLHDL 2 02/16/2020       Latest Ref Rng & Units 05/15/2022   12:31 PM 08/21/2020    4:25 PM 02/16/2020   11:28 AM  Hepatic Function  Total Protein 6.5 - 8.1 g/dL 7.2  7.4  6.7   Albumin 3.5 - 5.0 g/dL 4.0  4.3  4.1   AST 15 - 41 U/L $Remo'19  19  18   'mswxn$ ALT 0 - 44 U/L $Remo'16  16  18   'zjhZf$ Alk Phosphatase 38 - 126 U/L 81  82  87   Total Bilirubin 0.3 - 1.2 mg/dL 0.5  0.4  0.6     Lab Results  Component Value Date/Time   TSH 2.44 06/02/2021 12:27 PM   TSH 4.25 02/16/2020 11:28 AM   FREET4 1.07 02/16/2020 11:28 AM       Latest Ref Rng & Units 05/15/2022   12:31 PM 06/02/2021   12:27 PM 01/04/2021    8:14 AM  CBC  WBC 4.0 - 10.5 K/uL 5.5  6.3  7.7   Hemoglobin 12.0 - 15.0 g/dL 13.6  13.9  13.4   Hematocrit 36.0 - 46.0 % 42.4  40.9  41.2   Platelets 150 - 400 K/uL 226  230.0  226     Lab Results  Component Value Date/Time   VD25OH 50.58 02/16/2020 11:28 AM   VD25OH 21.43 (L) 03/14/2019 11:25 AM    Clinical ASCVD: No  The ASCVD Risk score (Arnett DK, et al., 2019) failed to calculate for the following reasons:   The 2019 ASCVD risk score is only valid  for ages 10 to 66       06/19/2021   10:45 AM 05/31/2020    2:56 PM 05/26/2019    2:10 PM  Depression screen PHQ 2/9  Decreased Interest 0 0 0  Down, Depressed, Hopeless 0 0 0  PHQ - 2 Score 0 0 0  Altered sleeping 0 0 0  Tired, decreased energy 0 0 0  Change in appetite 0 0 0  Feeling bad or failure about yourself  0 0 0  Trouble concentrating 0 0 0  Moving slowly or fidgety/restless 0 0 0  Suicidal thoughts 0 0 0  PHQ-9 Score 0 0 0  Difficult doing work/chores Not difficult at all Not difficult at all Not difficult at all     Social History   Tobacco Use  Smoking Status Never  Smokeless Tobacco Never   BP Readings from Last 3 Encounters:  06/03/22 120/60  05/30/22 119/65   05/15/22 (!) 141/90   Pulse Readings from Last 3 Encounters:  06/03/22 60  05/30/22 75  05/15/22 68   Wt Readings from Last 3 Encounters:  06/03/22 168 lb (76.2 kg)  06/01/22 167 lb (75.8 kg)  05/27/22 170 lb (77.1 kg)   BMI Readings from Last 3 Encounters:  06/03/22 27.12 kg/m  06/01/22 26.95 kg/m  05/27/22 27.44 kg/m    Assessment/Interventions: Review of patient past medical history, allergies, medications, health status, including review of consultants reports, laboratory and other test data, was performed as part of comprehensive evaluation and provision of chronic care management services.   SDOH:  (Social Determinants of Health) assessments and interventions performed: No  SDOH Screenings   Alcohol Screen: Low Risk  (06/19/2021)   Alcohol Screen    Last Alcohol Screening Score (AUDIT): 1  Depression (PHQ2-9): Low Risk  (06/19/2021)   Depression (PHQ2-9)    PHQ-2 Score: 0  Financial Resource Strain: Low Risk  (06/19/2021)   Overall Financial Resource Strain (CARDIA)    Difficulty of Paying Living Expenses: Not hard at all  Food Insecurity: No Food Insecurity (06/19/2021)   Hunger Vital Sign    Worried About Running Out of Food in the Last Year: Never true    [place] in the Last Year: Never true  Housing: Low Risk  (06/19/2021)   Housing    Last Housing Risk Score: 0  Physical Activity: Sufficiently Active (06/19/2021)   Exercise Vital Sign    Days of Exercise per Week: 7 days    Minutes of Exercise per Session: 30 min  Social Connections: Not on file  Stress: No Stress Concern Present (06/19/2021)   [place]    Feeling of Stress : Not at all  Tobacco Use: Low Risk  (06/03/2022)   Patient History    Smoking Tobacco Use: Never    Smokeless Tobacco Use: Never    Passive Exposure: Not on file  Transportation Needs: No Transportation Needs (06/19/2021)   PRAPARE - Transportation    Lack of  Transportation (Medical): No    Lack of Transportation (Non-Medical): No    CCM Care Plan  Allergies  Allergen Reactions   Anastrozole Other (See Comments), Itching and Rash    Back pain  Back pain  Back pain    Exemestane Rash    Pain in right hand  Pain in right hand  Pain in right hand    Naproxen Hives    Had bumps break out on the back  Had  bumps break out on the back  Had bumps break out on the back  Had bumps break out on the back  Had bumps break out on the back     Medications Reviewed Today     Reviewed by Charlton Haws, [facility] (Pharmacist) on 06/05/22 at 1127  Med List Status: <None>   Medication Order Taking? Sig Documenting Provider Last Dose Status Informant  alendronate (FOSAMAX) 70 MG tablet [id number] Yes Take 1 tablet (70 mg total) by mouth every Sunday. Ria Bush, MD Taking Active   apixaban (ELIQUIS) 5 MG TABS tablet [id number] No Take 1 tablet (5 mg total) by mouth 2 (two) times daily.  Patient not taking: Reported on 06/05/2022   Vanna Scotland, MD Not Taking Active            Med Note Charlton Haws   Fri Jun 05, 2022 11:12 AM) On hold per cardiology while she is wearing Zio patch  Calcium Carb-Cholecalciferol (CALCIUM 600 + D PO) [id number] No Take 1 tablet by mouth daily. Ca 500 + Vit D 800  Patient not taking: Reported on 06/05/2022   [provider] Not Taking Active Self  celecoxib (CELEBREX) 200 MG capsule [id number] Yes Take 1 capsule (200 mg total) by mouth 2 (two) times daily. Fausto Skillern, PA-C Taking Active   Ciclopirox 0.77 % gel [id number] Yes Apply 1 application topically 2 (two) times daily as needed (fungus). [provider] Taking Active Self  clobetasol cream (TEMOVATE) 0.05 % [id number] Yes Apply 1 application topically as directed. Qd to bid up to 5 days a week to aa psoriasis on body until clear, then prn flares, avoid face, groin, axilla [name], MD Taking Active            Med Note Earl Gala May 12, 2022  2:46 PM) Taking twice a day every day? Last filled June 2022  diclofenac sodium (VOLTAREN) 1 % GEL [id number] Yes Apply 1 application topically 3 (three) times daily. Ria Bush, MD Taking Active   Docusate Sodium (DSS) 100 MG CAPS [id number] Yes Take by mouth as needed. [provider] Taking Active   latanoprost (XALATAN) 0.005 % ophthalmic solution [id number] Yes latanoprost 0.005 % eye drops  PLACE 1 DROP INTO BOTH EYES ONCE EVERY EVENING [provider] Taking Active   levothyroxine (SYNTHROID) 75 MCG tablet [id number] Yes Take 1 tablet (75 mcg total) by mouth daily. Ria Bush, MD Taking Active   losartan (COZAAR) 50 MG tablet [id number] Yes Take 1 tablet (50 mg total) by mouth daily. Vanna Scotland, MD Taking Active   Magnesium 400 MG TABS [id number] Yes Take 400 mg by mouth daily. [provider] Taking Active Self  metoprolol tartrate (LOPRESSOR) 25 MG tablet [id number] Yes Take 0.5 tablets (12.5 mg total) by mouth 2 (two) times daily. Vanna Scotland, MD Taking Active   Multiple Vitamin (MULTIVITAMIN) tablet [id number] Yes Take 1 tablet by mouth daily. [provider] Taking Active            Med Note Jilda Roche A   Wed Jun 11, 2021  4:39 PM)    polyethylene glycol powder (GLYCOLAX/MIRALAX) 17 GM/SCOOP powder [id number] Yes Take 8.5-17 g by mouth daily as needed for moderate constipation. Ria Bush, MD Taking Active   Roflumilast (ZORYVE) 0.3 % CREA [id number] Yes Apply 1 application. topically every other day. Sample [provider] Taking Active Self  SELENIUM PO [id number] Yes Take 1 tablet by mouth daily.  [provider] Taking Active Self  silver sulfADIAZINE (SILVADENE) 1 % cream [id number] Yes Apply 1 application topically daily. Schnier, Dolores Lory, MD Taking Active   tacrolimus (PROTOPIC) 0.1 % ointment [id number] Yes APPLY TOPICALLY TO AFFECTED AREA(S) TWICE DAILY [name], MD Taking Active    traMADol (ULTRAM) 50 MG tablet [id number] Yes Take 1 tablet (50 mg total) by mouth every 4 (four) hours as needed for moderate pain. Fausto Skillern, PA-C Taking Active   vitamin C (ASCORBIC ACID) 500 MG tablet [id number] No Take 500 mg by mouth daily.  Patient not taking: Reported on 06/05/2022   [provider] Not Taking Active Self  vitamin E 180 MG (400 UNITS) capsule [id number] No Take 400 Units by mouth daily.  Patient not taking: Reported on 06/05/2022   [provider] Not Taking Active Self            Patient Active Problem List   Diagnosis Date Noted   Constipation 06/01/2022   Atrial fibrillation ([place]) 06/01/2022   Total knee replacement status 05/27/2022   Venous ulcer of ankle, unspecified laterality ([place]) 12/24/2021   Bilateral lower extremity edema 12/17/2021   Pre-op evaluation 06/02/2021   Right wrist pain 02/28/2021   Skin rash 02/28/2021   Ascending aorta dilatation ([place]) 01/10/2021   Left-sided chest pain 01/10/2021   Genetic testing 10/30/2020   Malignant neoplasm of overlapping sites of left breast in female, estrogen receptor positive ([place]) 10/22/2020   Family history of breast cancer    Family history of thyroid cancer    Insertional Achilles tendinopathy 08/28/2020   Breast cancer, left breast ([place]) 08/01/2020   Right carotid bruit 06/05/2020   Chest pressure 06/03/2020   Left Achilles tendinitis 09/01/2019   Low back pain 08/29/2019   Lumbosacral spondylosis without myelopathy 08/29/2019   Calcaneal spur 08/17/2019   Contusion of knee 10/04/2018   Varicose veins of bilateral lower extremities with pain 03/30/2018   Lymphedema 03/30/2018   Dizziness 02/02/2017   Situational depression 12/18/2016   Vitamin D deficiency 10/11/2016   Fatigue 08/12/2016   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 04/14/2016   Essential hypertension 12/17/2015   Dermatitis of external ear 12/17/2015   [place] of foot 05/16/2015    Advanced care planning/counseling discussion 10/30/2014   Neck pain on right side 06/25/2014   Primary osteoarthritis of left knee 08/17/2013   Oropharyngeal dysphagia 11/15/2012   Stressful life events affecting family and household 03/30/2012   Medicare annual wellness visit, subsequent 03/30/2012   History of basal cell carcinoma    Osteoarthritis    Osteopenia    Hypothyroidism    Glaucoma    Chronic venous insufficiency     Immunization History  Administered Date(s) Administered   Fluad Quad(high Dose 65+) 09/22/2019   Hep A / Hep B 06/05/2015, 12/17/2015   Influenza Inj Mdck Quad Pf 12/26/2021   Influenza Split 11/15/2012   Influenza, High Dose Seasonal PF 11/14/2014, 10/28/2020   Influenza,inj,Quad PF,6+ Mos 09/06/2013, 12/18/2016, 10/05/2018   PFIZER(Purple Top)SARS-COV-2 Vaccination 01/28/2020, 02/20/2020, 12/09/2020   Pfizer Covid-19 Vaccine Bivalent Booster 89yrs & up 09/22/2021   Pneumococcal Conjugate-13 10/30/2014   Pneumococcal Polysaccharide-23 11/15/2012   Td 05/24/2012   Zoster, Live 07/03/2014    Conditions to be addressed/monitored:  Hypertension, Hyperlipidemia, Atrial Fibrillation, Hypothyroidism, and Osteopenia  Care Plan : [place]  Updates made by Charlton Haws, [place] since 06/05/2022 12:00 AM     Problem: Hypertension, Hyperlipidemia, Atrial Fibrillation,  Hypothyroidism, and Osteopenia   Priority: High     Long-Range Goal: Disease mgmt   Start Date: 12/10/2021  Expected End Date: 06/06/2023  This Visit's Progress: On track  Recent Progress: On track  Priority: High  Note:   Current Barriers:  Suboptimal therapeutic regimen for Afib  Pharmacist Clinical Goal(s):  Patient will adhere to plan to optimize therapeutic regimen for Afib as evidenced by report of adherence to recommended medication management changes through collaboration with PharmD and provider.   Interventions: 1:1 collaboration with Ria Bush, MD  regarding development and update of comprehensive plan of care as evidenced by provider attestation and co-signature Inter-disciplinary care team collaboration (see longitudinal plan of care) Comprehensive medication review performed; medication list updated in electronic medical record  Atrial Fibrillation (Goal: prevent stroke and major bleeding) -Controlled - pt affirms she is still taking metoprolol and not taking Eliquis; she started aspirin yesterday, was using 1/2 of 325 mg tablet  -New onset Afib 05/2022 s/p TKA; Eliquis currently on hold per cardiologist due to excessive bruising on thigh, pt is awaiting Zio monitor for further management decisions -CHADSVASC: 4 -Home BP and HR readings: BP 113/75, HR 95  -Current treatment: Metoprolol tartrate 25 mg - 1/2 tab BID - Appropriate, Effective, Safe, Accessible Eliquis 5 mg BID - on hold Aspirin 325 mg -1/2 tab daily - Appropriate, Effective, Query Safe -Medications previously tried: n/a -Discussed Zio monitor - results will help determine treatment course going forward -Recommended to continue current medication; advised to get Aspirin 81 mg tablet given bruising issues with Eliquis + NSAID use (celecoxib 200 mg BID)  Hypertension (BP goal <140/90) -Controlled - pt endorses compliance with medication; she is not checking BP at home -Current treatment: Losartan 50 mg daily - Appropriate, Effective, Safe, Accessible Metoprolol tartrate 25 mg - 1/2 tab BID - Appropriate, Effective, Safe, Accessible -Educated on BP goals and benefits of medications for prevention of heart attack, stroke and kidney damage; Daily salt intake goal < 2300 mg; Importance of home blood pressure monitoring; -Counseled to monitor BP at home periodically -Recommended to continue current medication  Hyperlipidemia: (LDL goal < 130) -Controlled without medication - given age and lack of ASCVD, no statin is indicated -Current treatment: None -Educated on  Cholesterol goals;  -Counseled on diet and exercise extensively  Osteopenia (Goal prevent fractures) -Controlled - pt endorses compliance with alendronate -Hx breast cancer Fall 2021; on exemestane for a few months -Last DEXA Scan: 10/31/20   T-Score total hip: -2.3  T-Score lumbar spine: -1.5  10-year probability of major osteoporotic fracture: 24.4%  10-year probability of hip fracture: 7.7% -Patient is a candidate for pharmacologic treatment due to T-Score -1.0 to -2.5 and 10-year risk of major osteoporotic fracture > 20% and T-Score -1.0 to -2.5 and 10-year risk of hip fracture > 3% -Current treatment  Alendronate 70 mg weekly Sundays (2013-2016, 2021-) - Appropriate, Effective, Safe, Accessible Calcium 600 mg w/ Vit D 800 - Appropriate, Effective, Safe, Accessible Vitamin D 2000 IU - Appropriate, Effective, Safe, Accessible -Recommend weight-bearing and muscle strengthening exercises for building and maintaining bone density. -Recommended to continue current medication  Hypothyroidism (Goal: maintain TSH in goal range) -Controlled - TSH is at goal; pt reports taking levothyroxine half hour before breakfast -Current treatment  Levothyroxine 75 mcg daily - Appropriate, Effective, Safe, Accessible -Recommended to continue current medication  Osteoarthritis (Goal: manage pain) -Controlled - pt is s/p TKA 05/2022; she has been taking higher dose of Celebrex (200 mg BID vs  QD prior to surgery) -Current treatment  Voltaren 1% gel - Appropriate, Effective, Safe, Accessible Celecoxib 200 mg BID - Appropriate, Effective, Query Safe Tramadol 50 mg PRN - Appropriate, Effective, Safe, Accessible -Recommended to continue current medication  Eczema/Psoriasis (Goal: manage symptoms) -Controlled - per pt report -pt does not use clobetasol and triamcinolone together; she has skin thinning and using triamcinolone on those areas -Current treatment  Ciclopirox 0.77% gel Clobetasol 0.05%  cream Silvadene 1% cream Tacrolimus 0.1% ointment +Triamcinolone +Desonide Cerave -Medications previously tried: clotrimazole, fluocinonide, fluticasone   -Recommended to continue current medication  Health Maintenance -Vaccine gaps: Flu, Shingrix -Advised flu shot -Hx Breast cancer. Dx 07/2020 (2 masses). S/p Lumpectomy 08/2020. S/p Mastectomy 05/2021. No endocrine therapy (anastrozole, exemestane not tolerated) -Pt reports she has not resumed her vitamins since discharge from hospital, discussed this is not a problem and she can resume at her convenience  Patient Goals/Self-Care Activities Patient will:  - take medications as prescribed as evidenced by patient report and record review focus on medication adherence by pill box         Medication Assistance: None required.  Patient affirms current coverage meets needs.  Compliance/Adherence/Medication fill history: Care Gaps: None  Star-Rating Drugs: Losartan - LF 09/22/21 x 90 ds; PDC inaccurate in chart (60%)  Medication Access: Within the past 30 days, how often has patient missed a dose of medication? 0 Is a pillbox or other method used to improve adherence? Yes  Factors that may affect medication adherence? no barriers identified Are meds synced by current pharmacy? No  Are meds delivered by current pharmacy? No  Does patient experience delays in picking up medications due to transportation concerns? No   [facility] Services Reviewed: Is patient disadvantaged to use [facility]?: No  Current Rx insurance plan: [facility] Name and location of Current pharmacy:  [id number] - [place], [place] - [address] [place] [address] Phone: [phone] Fax: [phone]  [facility] services reviewed with patient today?: No  Patient requests to transfer care to [facility]?: No  Reason patient declined to change pharmacies: Loyalty to other pharmacy/Patient preference   Care  Plan and Follow Up Patient Decision:  Patient agrees to Care Plan and Follow-up.  Plan: Telephone follow up appointment with care management team member scheduled for:  2 months  Charlene Brooke, PharmD, [facility] Clinical Pharmacist [facility] at [facility] [phone]

## 2022-06-05 NOTE — Patient Instructions (Signed)
Visit Information  Phone number for Pharmacist: [phone]   Goals Addressed   None     Care Plan : [place]  Updates made by Charlton Haws, [place] since 06/05/2022 12:00 AM     Problem: Hypertension, Hyperlipidemia, Atrial Fibrillation, Hypothyroidism, and Osteopenia   Priority: High     Long-Range Goal: Disease mgmt   Start Date: 12/10/2021  Expected End Date: 06/06/2023  This Visit's Progress: On track  Recent Progress: On track  Priority: High  Note:   Current Barriers:  Suboptimal therapeutic regimen for Afib  Pharmacist Clinical Goal(s):  Patient will adhere to plan to optimize therapeutic regimen for Afib as evidenced by report of adherence to recommended medication management changes through collaboration with PharmD and provider.   Interventions: 1:1 collaboration with Ria Bush, MD regarding development and update of comprehensive plan of care as evidenced by provider attestation and co-signature Inter-disciplinary care team collaboration (see longitudinal plan of care) Comprehensive medication review performed; medication list updated in electronic medical record  Atrial Fibrillation (Goal: prevent stroke and major bleeding) -Controlled - pt affirms she is still taking metoprolol and not taking Eliquis; she started aspirin yesterday, was using 1/2 of 325 mg tablet  -New onset Afib 05/2022 s/p TKA; Eliquis currently on hold per cardiologist due to excessive bruising on thigh, pt is awaiting Zio monitor for further management decisions -CHADSVASC: 4 -Home BP and HR readings: BP 113/75, HR 95  -Current treatment: Metoprolol tartrate 25 mg - 1/2 tab BID - Appropriate, Effective, Safe, Accessible Eliquis 5 mg BID - on hold Aspirin 325 mg -1/2 tab daily - Appropriate, Effective, Query Safe -Medications previously tried: n/a -Discussed Zio monitor - results will help determine treatment course going forward -Recommended to continue current  medication; advised to get Aspirin 81 mg tablet given bruising issues with Eliquis + NSAID use (celecoxib 200 mg BID)  Hypertension (BP goal <140/90) -Controlled - pt endorses compliance with medication; she is not checking BP at home -Current treatment: Losartan 50 mg daily - Appropriate, Effective, Safe, Accessible Metoprolol tartrate 25 mg - 1/2 tab BID - Appropriate, Effective, Safe, Accessible -Educated on BP goals and benefits of medications for prevention of heart attack, stroke and kidney damage; Daily salt intake goal < 2300 mg; Importance of home blood pressure monitoring; -Counseled to monitor BP at home periodically -Recommended to continue current medication  Hyperlipidemia: (LDL goal < 130) -Controlled without medication - given age and lack of ASCVD, no statin is indicated -Current treatment: None -Educated on Cholesterol goals;  -Counseled on diet and exercise extensively  Osteopenia (Goal prevent fractures) -Controlled - pt endorses compliance with alendronate -Hx breast cancer Fall 2021; on exemestane for a few months -Last DEXA Scan: 10/31/20   T-Score total hip: -2.3  T-Score lumbar spine: -1.5  10-year probability of major osteoporotic fracture: 24.4%  10-year probability of hip fracture: 7.7% -Patient is a candidate for pharmacologic treatment due to T-Score -1.0 to -2.5 and 10-year risk of major osteoporotic fracture > 20% and T-Score -1.0 to -2.5 and 10-year risk of hip fracture > 3% -Current treatment  Alendronate 70 mg weekly Sundays (2013-2016, 2021-) - Appropriate, Effective, Safe, Accessible Calcium 600 mg w/ Vit D 800 - Appropriate, Effective, Safe, Accessible Vitamin D 2000 IU - Appropriate, Effective, Safe, Accessible -Recommend weight-bearing and muscle strengthening exercises for building and maintaining bone density. -Recommended to continue current medication  Hypothyroidism (Goal: maintain TSH in goal range) -Controlled - TSH is at goal; pt  reports  taking levothyroxine half hour before breakfast -Current treatment  Levothyroxine 75 mcg daily - Appropriate, Effective, Safe, Accessible -Recommended to continue current medication  Osteoarthritis (Goal: manage pain) -Controlled - pt is s/p TKA 05/2022; she has been taking higher dose of Celebrex (200 mg BID vs QD prior to surgery) -Current treatment  Voltaren 1% gel - Appropriate, Effective, Safe, Accessible Celecoxib 200 mg BID - Appropriate, Effective, Query Safe Tramadol 50 mg PRN - Appropriate, Effective, Safe, Accessible -Recommended to continue current medication  Eczema/Psoriasis (Goal: manage symptoms) -Controlled - per pt report -pt does not use clobetasol and triamcinolone together; she has skin thinning and using triamcinolone on those areas -Current treatment  Ciclopirox 0.77% gel Clobetasol 0.05% cream Silvadene 1% cream Tacrolimus 0.1% ointment +Triamcinolone +Desonide Cerave -Medications previously tried: clotrimazole, fluocinonide, fluticasone   -Recommended to continue current medication  Health Maintenance -Vaccine gaps: Flu, Shingrix -Advised flu shot -Hx Breast cancer. Dx 07/2020 (2 masses). S/p Lumpectomy 08/2020. S/p Mastectomy 05/2021. No endocrine therapy (anastrozole, exemestane not tolerated) -Pt reports she has not resumed her vitamins since discharge from hospital, discussed this is not a problem and she can resume at her convenience  Patient Goals/Self-Care Activities Patient will:  - take medications as prescribed as evidenced by patient report and record review focus on medication adherence by pill box       The patient verbalized understanding of instructions, educational materials, and care plan provided today and DECLINED offer to receive copy of patient instructions, educational materials, and care plan.  Telephone follow up appointment with pharmacy team member scheduled for: 2 months  Charlene Brooke, PharmD, [facility] Clinical  Pharmacist [facility] [phone]

## 2022-06-06 DIAGNOSIS — M8580 Other specified disorders of bone density and structure, unspecified site: Secondary | ICD-10-CM | POA: Diagnosis not present

## 2022-06-06 DIAGNOSIS — M7662 Achilles tendinitis, left leg: Secondary | ICD-10-CM | POA: Diagnosis not present

## 2022-06-06 DIAGNOSIS — I77819 Aortic ectasia, unspecified site: Secondary | ICD-10-CM | POA: Diagnosis not present

## 2022-06-06 DIAGNOSIS — I10 Essential (primary) hypertension: Secondary | ICD-10-CM | POA: Diagnosis not present

## 2022-06-06 DIAGNOSIS — H409 Unspecified glaucoma: Secondary | ICD-10-CM | POA: Diagnosis not present

## 2022-06-06 DIAGNOSIS — Z471 Aftercare following joint replacement surgery: Secondary | ICD-10-CM | POA: Diagnosis not present

## 2022-06-07 DIAGNOSIS — I4891 Unspecified atrial fibrillation: Secondary | ICD-10-CM

## 2022-06-08 ENCOUNTER — Telehealth (INDEPENDENT_AMBULATORY_CARE_PROVIDER_SITE_OTHER): Payer: Self-pay | Admitting: Vascular Surgery

## 2022-06-08 DIAGNOSIS — H409 Unspecified glaucoma: Secondary | ICD-10-CM | POA: Diagnosis not present

## 2022-06-08 DIAGNOSIS — Z471 Aftercare following joint replacement surgery: Secondary | ICD-10-CM | POA: Diagnosis not present

## 2022-06-08 DIAGNOSIS — M7662 Achilles tendinitis, left leg: Secondary | ICD-10-CM | POA: Diagnosis not present

## 2022-06-08 DIAGNOSIS — I10 Essential (primary) hypertension: Secondary | ICD-10-CM | POA: Diagnosis not present

## 2022-06-08 DIAGNOSIS — M8580 Other specified disorders of bone density and structure, unspecified site: Secondary | ICD-10-CM | POA: Diagnosis not present

## 2022-06-08 DIAGNOSIS — I77819 Aortic ectasia, unspecified site: Secondary | ICD-10-CM | POA: Diagnosis not present

## 2022-06-08 NOTE — Telephone Encounter (Signed)
She can be scheduled with GS as an appointment allows, no studies

## 2022-06-08 NOTE — Telephone Encounter (Signed)
Patient had knee surgery Wednesday and stated her leg is very swollen and very painful and tight close to the ankle area by the foot.  She states it is the same leg that was operated. She also states it feels like it is going to explode.  She said she wears her compression socks and uses her pump.  She wants an appointment to see Dr. Delana Meyer

## 2022-06-08 NOTE — Addendum Note (Signed)
Addended by: Anselm Pancoast on: 06/08/2022 04:38 PM   Modules accepted: Orders

## 2022-06-09 ENCOUNTER — Ambulatory Visit (INDEPENDENT_AMBULATORY_CARE_PROVIDER_SITE_OTHER): Payer: Medicare Other | Admitting: Psychology

## 2022-06-09 DIAGNOSIS — F4323 Adjustment disorder with mixed anxiety and depressed mood: Secondary | ICD-10-CM

## 2022-06-09 NOTE — Progress Notes (Signed)
[name] Counselor/Therapist Progress Note  Patient ID: Beth Rodgers, MRN: [id number]    Date: 06/09/22  Time Spent: 1:02 pm - 1:57 pm : 55 Minutes  Treatment Type: Individual Therapy.  Reported Symptoms: Rumination, anxiety.    Mental Status Exam: Appearance:  NA     Behavior: Appropriate  Motor: NA  Speech/Language:  Clear and Coherent and Normal Rate  Affect: Congruent  Mood: dysthymic  Thought process: normal  Thought content:   WNL  Sensory/Perceptual disturbances:   WNL  Orientation: oriented to person, place, time/date, and situation  Attention: Good  Concentration: Good  Memory: WNL  Fund of knowledge:  Good  Insight:   Good  Judgment:  Good  Impulse Control: Good   Risk Assessment: Danger to Self:  No Self-injurious Behavior: No  Danger to Others: No Duty to Warn:no Physical Aggression / Violence:No  Access to Firearms a concern: No  Gang Involvement:No   Subjective:   Beth Rodgers participated from home, via phone, and consented to treatment. Therapist participated from office. We met online due to [place] pandemic. Beth Rodgers reviewed the events of the past two weeks. Beth Rodgers noted having knee replacement surgery and noting this "not as bad" as she anticipated. She noted post-operative complications and a need to stay in the hospital for an additional time. She noted that her husband and her son, Beth Rodgers, both being supportive. She noted that her other son, Beth Rodgers, was recently arrested for disorderly conduct. She noted enacting more significant boundaries, going forward, with Beth Rodgers during this time. She reviewed her new boundaries during the session.She noted her attempts to recover from surgery and noted her husband being quite helpful during this time. Therapist praised Beth Rodgers for her effort and energy towards resolving interpersonal stressors. Beth Rodgers was engaged and motivated during the session. Therapist validated Beth Rodgers's feelings and provided supportive  therapy.   Interventions: Interpersonal  Diagnosis: Adjustment disorder with mixed anxiety and depressed mood  Plan: Patient is to use CBT, BA, Problem-solving, mindfulness and coping skills to help manage decrease symptoms associated with their diagnosis. (Target date: 08/16/22)   Long-term goal:   Reduce overall level, frequency, and intensity of the feelings of depression and anxiety evidenced by   decreased sadness, rumination, lethargy, and interpersonal stressors  from 6 to 7 days/week to 0 to 1 days/week per client report for at least 3 consecutive months.  Short-term goal:  Verbally express understanding of the relationship between feelings of depression, anxiety and their impact on thinking patterns and behaviors.  Verbalize an understanding of the role that distorted thinking plays in creating fears, excessive worry, and ruminations.  Engage in enjoyable activities on a consistent basis.  Beth Irish, LCSW

## 2022-06-10 DIAGNOSIS — I10 Essential (primary) hypertension: Secondary | ICD-10-CM | POA: Diagnosis not present

## 2022-06-10 DIAGNOSIS — H409 Unspecified glaucoma: Secondary | ICD-10-CM | POA: Diagnosis not present

## 2022-06-10 DIAGNOSIS — M7662 Achilles tendinitis, left leg: Secondary | ICD-10-CM | POA: Diagnosis not present

## 2022-06-10 DIAGNOSIS — Z471 Aftercare following joint replacement surgery: Secondary | ICD-10-CM | POA: Diagnosis not present

## 2022-06-10 DIAGNOSIS — I77819 Aortic ectasia, unspecified site: Secondary | ICD-10-CM | POA: Diagnosis not present

## 2022-06-10 DIAGNOSIS — M8580 Other specified disorders of bone density and structure, unspecified site: Secondary | ICD-10-CM | POA: Diagnosis not present

## 2022-06-11 DIAGNOSIS — M25562 Pain in left knee: Secondary | ICD-10-CM | POA: Diagnosis not present

## 2022-06-11 DIAGNOSIS — Z96652 Presence of left artificial knee joint: Secondary | ICD-10-CM | POA: Diagnosis not present

## 2022-06-12 ENCOUNTER — Other Ambulatory Visit: Payer: Self-pay | Admitting: Family Medicine

## 2022-06-15 ENCOUNTER — Telehealth (INDEPENDENT_AMBULATORY_CARE_PROVIDER_SITE_OTHER): Payer: Self-pay

## 2022-06-15 DIAGNOSIS — M25562 Pain in left knee: Secondary | ICD-10-CM | POA: Diagnosis not present

## 2022-06-15 DIAGNOSIS — Z96652 Presence of left artificial knee joint: Secondary | ICD-10-CM | POA: Diagnosis not present

## 2022-06-15 NOTE — Telephone Encounter (Signed)
Patient left a message stating that she knee replacement 2 weeks ago and now she experiencing pain with her vein on the left ankle.The patient has been using ice,elevation, and compression. Dr Delana Meyer advise that this normal and to be expected after having knee surgery. The patient should continue doing the ice,compression,elevation. Patient can use the lymph pump if that fine with surgeon and follow up with our office in few weeks. Patient verbalized understanding

## 2022-06-16 ENCOUNTER — Ambulatory Visit (INDEPENDENT_AMBULATORY_CARE_PROVIDER_SITE_OTHER): Payer: Medicare Other | Admitting: Dermatology

## 2022-06-16 DIAGNOSIS — I872 Venous insufficiency (chronic) (peripheral): Secondary | ICD-10-CM

## 2022-06-16 DIAGNOSIS — I8312 Varicose veins of left lower extremity with inflammation: Secondary | ICD-10-CM

## 2022-06-16 DIAGNOSIS — I8311 Varicose veins of right lower extremity with inflammation: Secondary | ICD-10-CM

## 2022-06-16 DIAGNOSIS — B351 Tinea unguium: Secondary | ICD-10-CM | POA: Diagnosis not present

## 2022-06-16 DIAGNOSIS — B353 Tinea pedis: Secondary | ICD-10-CM

## 2022-06-16 DIAGNOSIS — L409 Psoriasis, unspecified: Secondary | ICD-10-CM

## 2022-06-16 MED ORDER — CICLOPIROX OLAMINE 0.77 % EX CREA: TOPICAL_CREAM | CUTANEOUS | 2 refills | Status: DC

## 2022-06-16 NOTE — Progress Notes (Signed)
Follow-Up Visit   Subjective  Beth Rodgers is a 87 y.o. female who presents for the following: Follow-up.  Patient here for 4 month follow-up Stasis Dermatitis of the legs. She is only using Silvadene as needed currently. She had knee replacement surgery 3 weeks ago. She also has psoriasis of the arms, elbows, and back, currently flared. She uses OTC Arnicare Cream to left elbow. She also has Zoryve Cream, TMC Cream, Clobetasol Cream, and tacrolimus ointment, but not using currently. She has tinea pedis and is using ciclopirox cream. She has new rash on her legs.   The following portions of the chart were reviewed this encounter and updated as appropriate:       Review of Systems:  No other skin or systemic complaints except as noted in HPI or Assessment and Plan.  Objective  Well appearing patient in no apparent distress; mood and affect are within normal limits.  A focused examination was performed including legs, arms, back, face. Relevant physical exam findings are noted in the Assessment and Plan.  left lateral thigh, elbows, back, ears, arms Well-demarcated erythematous papules/plaques with silvery scale, guttate pink scaly papules L lat thigh, left elbow, left forearm, ears, back  lower legs Hyperpigmented woody induration of the bilateral lower legs and ankles   toenails Mild hyperkeratosis of plantar feet and heels, toenail dystrophy    Assessment & Plan  Psoriasis left lateral thigh, elbows, back, ears, arms  Chronic and persistent condition with duration or expected duration over one year. Condition is bothersome/symptomatic for patient. Currently flared.   Psoriasis is a chronic non-curable, but treatable genetic/hereditary disease that may have other systemic features affecting other organ systems such as joints (Psoriatic Arthritis). It is associated with an increased risk of inflammatory bowel disease, heart disease, non-alcoholic fatty liver disease, and  depression.    Restart Zoryve Cream Apply to AA face, ears, body once daily, samples x 2 given today. Patient also has a little left in a trade sample given last visit. Once runs out, she will use tacrolimus 0.1% ointment qd.  She does not want to use topical steroids.  Start CeraVe SA cream daily.  Related Medications clobetasol cream (TEMOVATE) 1.61 % Apply 1 application topically as directed. Qd to bid up to 5 days a week to aa psoriasis on body until clear, then prn flares, avoid face, groin, axilla  Stasis dermatitis of both legs lower legs  With lipodermatosclerosis - Chronic condition, no cure, controlled on current therapy   Cont Silvadene cream apply to aas feet/ankles daily  Cont Tacrolimus ointment qd-bid prn itchy rash Cont Support stockings daily   Stasis in the legs causes chronic leg swelling, which may result in itchy or painful rashes, skin discoloration, skin texture changes, and sometimes ulceration.  Recommend daily graduated compression hose/stockings- easiest to put on first thing in morning, remove at bedtime.  Elevate legs as much as possible. Avoid salt/sodium rich foods.  Lipodermatosclerosis is a chronic inflammatory condition of unknown cause of the subcutaneous fat causing tenderness, discoloration and hardening of the involved skin, most commonly on the lower legs. Discussed that it may progress and gradually worsen over time, especially in the setting of chronic leg swelling. Daily compression stockings/hose is recommended.     Tinea unguium toenails  With tinea pedis- improved  Continue Ciclopirox Cream - apply around toenails, toes, and between toes to prevent spread.  Start CeraVe SA Cream to feet and heels 1-2x daily.  ciclopirox (LOPROX) 0.77 % cream -  toenails Apply to feet, around toenails, between toes once daily for fungus.   Return in about 3 months (around 09/16/2022) for Psoriasis, Stasis Derm.  IJamesetta Orleans, CMA, am acting as  scribe for [name], MD .  Documentation: I have reviewed the above documentation for accuracy and completeness, and I agree with the above.  [name] MD

## 2022-06-16 NOTE — Patient Instructions (Addendum)
Beth Rodgers appt - July 22, 2022 at 10:00 AM   Continue Zoryve Cream - Apply to affected areas face, ears, body once daily. Not a steroid.  May also use Tacrolimus ointment to areas of psoriasis on face, ears, body. Not a steroid.   Recommend starting moisturizer with exfoliant (Urea, Salicylic acid, or Lactic acid) one to two times daily to help smooth rough and bumpy skin.  OTC options include Cetaphil Rough and Bumpy lotion (Urea), Eucerin Roughness Relief lotion or spot treatment cream (Urea), CeraVe SA lotion/cream for Rough and Bumpy skin (Sal Acid), Gold Bond Rough and Bumpy cream (Sal Acid), and AmLactin 12% lotion/cream (Lactic Acid).  If applying in morning, also apply sunscreen to sun-exposed areas, since these exfoliating moisturizers can increase sensitivity to sun. Ok to use CeraVe SA Cream twice daily to dry, scaly areas on body. Also apply to feet.  Derma-Smoothe F/S oil (fluocinolone oil) May use medicine dropper and apply inside ear canal once to twice daily for psoriasis.     Due to recent changes in healthcare laws, you may see results of your pathology and/or laboratory studies on MyChart before the doctors have had a chance to review them. We understand that in some cases there may be results that are confusing or concerning to you. Please understand that not all results are received at the same time and often the doctors may need to interpret multiple results in order to provide you with the best plan of care or course of treatment. Therefore, we ask that you please give [place] 2 business days to thoroughly review all your results before contacting the office for clarification. Should we see a critical lab result, you will be contacted sooner.   If You Need Anything After Your Visit  If you have any questions or concerns for your doctor, please call our main line at [phone] and press option 4 to reach your doctor's medical assistant. If no one answers, please leave a  voicemail as directed and we will return your call as soon as possible. Messages left after 4 pm will be answered the following business day.   You may also send [place] a message via [place]. We typically respond to MyChart messages within 1-2 business days.  For prescription refills, please ask your pharmacy to contact our office. Our fax number is [phone].  If you have an urgent issue when the clinic is closed that cannot wait until the next business day, you can page your doctor at the number below.    Please note that while we do our best to be available for urgent issues outside of office hours, we are not available 24/7.   If you have an urgent issue and are unable to reach [place], you may choose to seek medical care at your doctor's office, retail clinic, urgent care center, or emergency room.  If you have a medical emergency, please immediately call 911 or go to the emergency department.  Pager Numbers  - Dr. Nehemiah Massed: [phone]  - Dr. Laurence Ferrari: [phone]  - Dr. Nicole Kindred: [phone]  In the event of inclement weather, please call our main line at [phone] for an update on the status of any delays or closures.  Dermatology Medication Tips: Please keep the boxes that topical medications come in in order to help keep track of the instructions about where and how to use these. Pharmacies typically print the medication instructions only on the boxes and not directly on the medication tubes.   If your  medication is too expensive, please contact our office at [phone] option 4 or send [place] a message through [place].   We are unable to tell what your co-pay for medications will be in advance as this is different depending on your insurance coverage. However, we may be able to find a substitute medication at lower cost or fill out paperwork to get insurance to cover a needed medication.   If a prior authorization is required to get your medication covered by your insurance  company, please allow [place] 1-2 business days to complete this process.  Drug prices often vary depending on where the prescription is filled and some pharmacies may offer cheaper prices.  The website www.goodrx.com contains coupons for medications through different pharmacies. The prices here do not account for what the cost may be with help from insurance (it may be cheaper with your insurance), but the website can give you the price if you did not use any insurance.  - You can print the associated coupon and take it with your prescription to the pharmacy.  - You may also stop by our office during regular business hours and pick up a GoodRx coupon card.  - If you need your prescription sent electronically to a different pharmacy, notify our office through [facility] or by phone at [phone] option 4.     Si Usted Necesita Algo Despus de Su Visita  Tambin puede enviarnos un mensaje a travs de Pharmacist, community. Por lo general respondemos a los mensajes de MyChart en el transcurso de 1 a 2 das hbiles.  Para renovar recetas, por favor pida a su farmacia que se ponga en contacto con nuestra oficina. Harland Dingwall de fax es [place] [phone].  Si tiene un asunto urgente cuando la clnica est cerrada y que no puede esperar hasta el siguiente da hbil, puede llamar/localizar a su doctor(a) al nmero que aparece a continuacin.   Por favor, tenga en cuenta que aunque hacemos todo lo posible para estar disponibles para asuntos urgentes fuera del horario de [place], no estamos disponibles las 24 horas del da, los 7 das de la [place].   Si tiene un problema urgente y no puede comunicarse con nosotros, puede optar por buscar atencin mdica  en el consultorio de su doctor(a), en una clnica privada, en un centro de atencin urgente o en una sala de emergencias.  Si tiene Engineering geologist, por favor llame inmediatamente al 911 o vaya a la sala de emergencias.  Nmeros de bper  - Dr.  Nehemiah Massed: [phone]  - Dra. Moye: [phone]  - Dra. Nicole Kindred: [phone]  En caso de inclemencias del [place], por favor llame a Johnsie Kindred principal al [phone] para una actualizacin sobre el [place] de cualquier retraso o cierre.  Consejos para la medicacin en dermatologa: Por favor, guarde las cajas en las que vienen los medicamentos de uso tpico para ayudarle a seguir las instrucciones sobre dnde y cmo usarlos. Las farmacias generalmente imprimen las instrucciones del medicamento slo en las cajas y no directamente en los tubos del [place].   Si su medicamento es muy caro, por favor, pngase en contacto con Zigmund Daniel llamando al [phone] y presione la opcin 4 o envenos un mensaje a travs de Pharmacist, community.   No podemos decirle cul ser su copago por los medicamentos por adelantado ya que esto es diferente dependiendo de la cobertura de su seguro. Sin embargo, es posible que podamos encontrar un medicamento sustituto a Electrical engineer un formulario para  que el seguro cubra el medicamento que se considera necesario.   Si se requiere una autorizacin previa para que su compaa de seguros [place] su medicamento, por favor permtanos de 1 a 2 das hbiles para completar este proceso.  Los precios de los medicamentos varan con frecuencia dependiendo del Environmental consultant de dnde se surte la receta y alguna farmacias pueden ofrecer precios ms baratos.  El sitio web www.goodrx.com tiene cupones para medicamentos de Airline pilot. Los precios aqu no tienen en cuenta lo que podra costar con la ayuda del seguro (puede ser ms barato con su seguro), pero el sitio web puede darle el precio si no utiliz Research scientist (physical sciences).  - Puede imprimir el cupn correspondiente y llevarlo con su receta a la farmacia.  - Tambin puede pasar por nuestra oficina durante el horario de atencin regular y Charity fundraiser una tarjeta de cupones de GoodRx.  - Si necesita que su receta se enve  electrnicamente a una farmacia diferente, informe a nuestra oficina a travs de MyChart de [place] o por telfono llamando al [phone] y presione la opcin 4.

## 2022-06-17 ENCOUNTER — Other Ambulatory Visit: Payer: Medicare Other

## 2022-06-17 DIAGNOSIS — Z96652 Presence of left artificial knee joint: Secondary | ICD-10-CM | POA: Diagnosis not present

## 2022-06-17 DIAGNOSIS — M25562 Pain in left knee: Secondary | ICD-10-CM | POA: Diagnosis not present

## 2022-06-19 ENCOUNTER — Other Ambulatory Visit (INDEPENDENT_AMBULATORY_CARE_PROVIDER_SITE_OTHER): Payer: Medicare Other

## 2022-06-19 DIAGNOSIS — E039 Hypothyroidism, unspecified: Secondary | ICD-10-CM | POA: Diagnosis not present

## 2022-06-19 DIAGNOSIS — Z01818 Encounter for other preprocedural examination: Secondary | ICD-10-CM | POA: Diagnosis not present

## 2022-06-19 DIAGNOSIS — M85852 Other specified disorders of bone density and structure, left thigh: Secondary | ICD-10-CM

## 2022-06-19 DIAGNOSIS — M25562 Pain in left knee: Secondary | ICD-10-CM | POA: Diagnosis not present

## 2022-06-19 DIAGNOSIS — Z8639 Personal history of other endocrine, nutritional and metabolic disease: Secondary | ICD-10-CM | POA: Diagnosis not present

## 2022-06-19 DIAGNOSIS — Z96652 Presence of left artificial knee joint: Secondary | ICD-10-CM | POA: Diagnosis not present

## 2022-06-19 LAB — COMPREHENSIVE METABOLIC PANEL
ALT: 15 U/L (ref 0–35)
AST: 17 U/L (ref 0–37)
Albumin: 3.9 g/dL (ref 3.5–5.2)
Alkaline Phosphatase: 131 U/L — ABNORMAL HIGH (ref 39–117)
BUN: 20 mg/dL (ref 6–23)
CO2: 27 mEq/L (ref 19–32)
Calcium: 9.2 mg/dL (ref 8.4–10.5)
Chloride: 102 mEq/L (ref 96–112)
Creatinine, Ser: 0.68 mg/dL (ref 0.40–1.20)
GFR: 79.59 mL/min (ref >60.00)
Glucose, Bld: 84 mg/dL (ref 70–99)
Potassium: 4.6 mEq/L (ref 3.5–5.1)
Sodium: 138 mEq/L (ref 135–145)
Total Bilirubin: 0.6 mg/dL (ref 0.2–1.2)
Total Protein: 6.5 g/dL (ref 6.0–8.3)

## 2022-06-19 LAB — CBC WITH DIFFERENTIAL/PLATELET
Basophils Absolute: 0.1 10*3/uL (ref 0.0–0.1)
Basophils Relative: 0.9 % (ref 0.0–3.0)
Eosinophils Absolute: 0.4 10*3/uL (ref 0.0–0.7)
Eosinophils Relative: 6.4 % — ABNORMAL HIGH (ref 0.0–5.0)
HCT: 37.3 % (ref 36.0–46.0)
Hemoglobin: 12.2 g/dL (ref 12.0–15.0)
Lymphocytes Relative: 21.3 % (ref 12.0–46.0)
Lymphs Abs: 1.3 10*3/uL (ref 0.7–4.0)
MCHC: 32.7 g/dL (ref 30.0–36.0)
MCV: 88.8 fl (ref 78.0–100.0)
Monocytes Absolute: 0.7 10*3/uL (ref 0.1–1.0)
Monocytes Relative: 12 % (ref 3.0–12.0)
Neutro Abs: 3.5 10*3/uL (ref 1.4–7.7)
Neutrophils Relative %: 59.4 % (ref 43.0–77.0)
Platelets: 342 10*3/uL (ref 150.0–400.0)
RBC: 4.2 Mil/uL (ref 3.87–5.11)
RDW: 15.5 % (ref 11.5–15.5)
WBC: 5.9 10*3/uL (ref 4.0–10.5)

## 2022-06-19 LAB — TSH: TSH: 4.68 u[IU]/mL (ref 0.35–5.50)

## 2022-06-19 LAB — HEMOGLOBIN A1C: Hgb A1c MFr Bld: 5.4 % (ref 4.6–6.5)

## 2022-06-19 LAB — VITAMIN D 25 HYDROXY (VIT D DEFICIENCY, FRACTURES): VITD: 39.84 ng/mL (ref 30.00–100.00)

## 2022-06-22 ENCOUNTER — Ambulatory Visit (INDEPENDENT_AMBULATORY_CARE_PROVIDER_SITE_OTHER): Payer: Medicare Other

## 2022-06-22 ENCOUNTER — Telehealth: Payer: Self-pay

## 2022-06-22 VITALS — Ht 66.0 in | Wt 168.0 lb

## 2022-06-22 DIAGNOSIS — Z0000 Encounter for general adult medical examination without abnormal findings: Secondary | ICD-10-CM | POA: Diagnosis not present

## 2022-06-22 DIAGNOSIS — Z96652 Presence of left artificial knee joint: Secondary | ICD-10-CM | POA: Diagnosis not present

## 2022-06-22 DIAGNOSIS — M25562 Pain in left knee: Secondary | ICD-10-CM | POA: Diagnosis not present

## 2022-06-22 DIAGNOSIS — I10 Essential (primary) hypertension: Secondary | ICD-10-CM

## 2022-06-22 DIAGNOSIS — E663 Overweight: Secondary | ICD-10-CM

## 2022-06-23 ENCOUNTER — Ambulatory Visit (INDEPENDENT_AMBULATORY_CARE_PROVIDER_SITE_OTHER): Payer: Medicare Other | Admitting: Psychology

## 2022-06-23 DIAGNOSIS — F4323 Adjustment disorder with mixed anxiety and depressed mood: Secondary | ICD-10-CM

## 2022-06-23 DIAGNOSIS — E663 Overweight: Secondary | ICD-10-CM | POA: Insufficient documentation

## 2022-06-23 DIAGNOSIS — E669 Obesity, unspecified: Secondary | ICD-10-CM | POA: Insufficient documentation

## 2022-06-23 NOTE — Telephone Encounter (Signed)
New referral placed to nutritionist.

## 2022-06-23 NOTE — Telephone Encounter (Signed)
Patient notified as instructed by telephone and verbalized understanding. 

## 2022-06-24 ENCOUNTER — Ambulatory Visit (INDEPENDENT_AMBULATORY_CARE_PROVIDER_SITE_OTHER): Payer: Medicare Other | Admitting: Family Medicine

## 2022-06-24 ENCOUNTER — Encounter: Payer: Self-pay | Admitting: Family Medicine

## 2022-06-24 VITALS — BP 128/76 | HR 65 | Temp 97.9°F | Ht 63.5 in | Wt 172.1 lb

## 2022-06-24 DIAGNOSIS — I77810 Thoracic aortic ectasia: Secondary | ICD-10-CM | POA: Diagnosis not present

## 2022-06-24 DIAGNOSIS — Z170 Estrogen receptor positive status [ER+]: Secondary | ICD-10-CM

## 2022-06-24 DIAGNOSIS — I872 Venous insufficiency (chronic) (peripheral): Secondary | ICD-10-CM | POA: Diagnosis not present

## 2022-06-24 DIAGNOSIS — I890 Lymphedema, not elsewhere classified: Secondary | ICD-10-CM | POA: Diagnosis not present

## 2022-06-24 DIAGNOSIS — Z7189 Other specified counseling: Secondary | ICD-10-CM | POA: Diagnosis not present

## 2022-06-24 DIAGNOSIS — Z96652 Presence of left artificial knee joint: Secondary | ICD-10-CM | POA: Diagnosis not present

## 2022-06-24 DIAGNOSIS — I10 Essential (primary) hypertension: Secondary | ICD-10-CM

## 2022-06-24 DIAGNOSIS — M85852 Other specified disorders of bone density and structure, left thigh: Secondary | ICD-10-CM

## 2022-06-24 DIAGNOSIS — E559 Vitamin D deficiency, unspecified: Secondary | ICD-10-CM

## 2022-06-24 DIAGNOSIS — I83813 Varicose veins of bilateral lower extremities with pain: Secondary | ICD-10-CM

## 2022-06-24 DIAGNOSIS — I4891 Unspecified atrial fibrillation: Secondary | ICD-10-CM

## 2022-06-24 DIAGNOSIS — E039 Hypothyroidism, unspecified: Secondary | ICD-10-CM | POA: Diagnosis not present

## 2022-06-24 DIAGNOSIS — E66811 Obesity, class 1: Secondary | ICD-10-CM

## 2022-06-24 DIAGNOSIS — M25562 Pain in left knee: Secondary | ICD-10-CM | POA: Diagnosis not present

## 2022-06-24 DIAGNOSIS — C50812 Malignant neoplasm of overlapping sites of left female breast: Secondary | ICD-10-CM | POA: Diagnosis not present

## 2022-06-24 DIAGNOSIS — M1712 Unilateral primary osteoarthritis, left knee: Secondary | ICD-10-CM

## 2022-06-24 DIAGNOSIS — E669 Obesity, unspecified: Secondary | ICD-10-CM

## 2022-06-24 MED ORDER — VITAMIN D3 25 MCG (1000 UT) PO CAPS: 1.0000 | ORAL_CAPSULE | Freq: Every day | ORAL | Status: DC

## 2022-06-24 MED ORDER — LEVOTHYROXINE SODIUM 75 MCG PO TABS
75.0000 ug | ORAL_TABLET | Freq: Every day | ORAL | 3 refills | Status: DC
Start: 2022-06-24 — End: 2023-07-20

## 2022-06-24 MED ORDER — METOPROLOL TARTRATE 25 MG PO TABS
12.5000 mg | ORAL_TABLET | Freq: Two times a day (BID) | ORAL | 3 refills | Status: DC
Start: 2022-06-24 — End: 2022-08-26

## 2022-06-24 MED ORDER — ALENDRONATE SODIUM 70 MG PO TABS
70.0000 mg | ORAL_TABLET | ORAL | 3 refills | Status: DC
Start: 2022-06-28 — End: 2023-07-20

## 2022-06-24 MED ORDER — LOSARTAN POTASSIUM 50 MG PO TABS
50.0000 mg | ORAL_TABLET | Freq: Every day | ORAL | 3 refills | Status: DC
Start: 2022-06-24 — End: 2022-09-28

## 2022-06-24 NOTE — Progress Notes (Signed)
Patient ID: Beth Rodgers, female    DOB: Mar 08, 1935, 87 y.o.   MRN: [id number]  This visit was conducted in person.  BP 128/76   Pulse 65   Temp 97.9 F (36.6 C) (Temporal)   Ht 5' 3.5" (1.613 m)   Wt 172 lb 2 oz (78.1 kg)   SpO2 98%   BMI 30.01 kg/m    CC: AMW f/u visit  Subjective:   HPI: Beth Rodgers is a 87 y.o. female presenting on 06/24/2022 for Annual Exam [facility] prt 2. )   Saw health advisor Monday for medicare wellness visit. Note reviewed.   No results found.  Flowsheet Row Clinical Support from 06/22/2022 in [place] at [place]  PHQ-2 Total Score 0          06/22/2022   10:53 AM 06/19/2021   10:44 AM 05/31/2020    2:54 PM 05/26/2019    2:10 PM 03/09/2018   11:02 AM  Fall Risk   Falls in the past year? 0 1 0 1 No  Comment    fell in hospital   Number falls in past yr: 0 0 0 0   Injury with Fall? 0 0 0 1   Risk for fall due to : No Fall Risks Medication side effect Medication side effect    Follow up Falls prevention discussed Falls evaluation completed;Falls prevention discussed Falls evaluation completed;Falls prevention discussed       L knee replacement surgery 06/6282 complicated by lone Afib with RVR started on eliquis, discharged on metoprolol and losartan.  Saw cardiology in f/u earlier this month, rec hold eliquis due to significant bruising and short episode of lone afib, sent home with zio monitor. Just completed wearing this today.  Status post left breast mastectomy 05/2021 for breast cancer  Preventative: Declined colonoscopy, stool kits have been normal to date. Age out. no blood in stool or bowel changes.  Mammogram 11/2021 - Birads1 @ [facility]  Well woman with GYN Karoline Caldwell - has not recently seen. Denies pelvic pain, pressure, or vaginal bleeding or skin changes.  DEXA Date: 06/2009 T score Spine: -1.8, hip -2.0.  DEXA 11/2015 persistent osteopenia T -2.3. DEXA 10/2019 T -2.3 hip, spine -2.2.  DEXA 10/2020: T  -2.3 L femur, -1.5 spine - fosamax x4-5 yrs, restarted 10/2020.  Lung cancer screening - not eligible  Flu shot -yearly  Td 04/2012  COVID vaccine - [place] 12/2019, 01/2020, booster x1 11/2020, bivalent 08/2021 Pneumovax 10/2012. Prevnar-13 2015 zostavax 06/2014  Shingrix - discussed - will check with pharmacy  Advanced directives: has packet at home, needs to work on this. Would want husband to be HCPOA. Packet previously provided 2021.  Seat belt use discussed.  Sunscreen use discussed. Denies changing moles. Sees derm regularly Non smoker  Alcohol - seldom  Dentist q25mo Uses night guard regularly. Eye exam Q6 mo  Bowel - some constipation with pain medications - managed with prunes  Bladder - no incontinence    Caffeine: 2-3 cups/day Lives with husband, majority of time [place] Occ: IField seismologist aMetallurgistActivity: bed exercises, limited walking Diet: does get fruits and vegetables, only some water      Relevant past medical, surgical, family and social history reviewed and updated as indicated. Interim medical history since our last visit reviewed. Allergies and medications reviewed and updated. Outpatient Medications Prior to Visit  Medication Sig Dispense Refill   Calcium Carb-Cholecalciferol (  CALCIUM 600 + D PO) Take 1 tablet by mouth daily. Ca 500 + Vit D 800     celecoxib (CELEBREX) 200 MG capsule Take 1 capsule (200 mg total) by mouth 2 (two) times daily. 90 capsule 0   ciclopirox (LOPROX) 0.77 % cream Apply to feet, around toenails, between toes once daily for fungus. 90 g 2   Ciclopirox 0.77 % gel Apply 1 application  topically 2 (two) times daily as needed (fungus).     clobetasol cream (TEMOVATE) 5.85 % Apply 1 application topically as directed. Qd to bid up to 5 days a week to aa psoriasis on body until clear, then prn flares, avoid face, groin, axilla 60 g 2   diclofenac sodium (VOLTAREN) 1 % GEL Apply 1 application  topically 3 (three) times daily. 1 Tube 0   latanoprost (XALATAN) 0.005 % ophthalmic solution latanoprost 0.005 % eye drops  PLACE 1 DROP INTO BOTH EYES ONCE EVERY EVENING     Magnesium 400 MG TABS Take 400 mg by mouth daily.     Multiple Vitamin (MULTIVITAMIN) tablet Take 1 tablet by mouth daily.     polyethylene glycol powder (GLYCOLAX/MIRALAX) 17 GM/SCOOP powder Take 8.5-17 g by mouth daily as needed for moderate constipation. 3350 g 0   Roflumilast (ZORYVE) 0.3 % CREA Apply 1 application. topically every other day. Sample     SELENIUM PO Take 1 tablet by mouth daily.     silver sulfADIAZINE (SILVADENE) 1 % cream Apply 1 application topically daily. 50 g 4   tacrolimus (PROTOPIC) 0.1 % ointment APPLY TOPICALLY TO AFFECTED AREA(S) TWICE DAILY 30 g 0   traMADol (ULTRAM) 50 MG tablet Take 1 tablet (50 mg total) by mouth every 4 (four) hours as needed for moderate pain. 30 tablet 0   vitamin C (ASCORBIC ACID) 500 MG tablet Take 500 mg by mouth daily.     vitamin E 180 MG (400 UNITS) capsule Take 400 Units by mouth daily.     alendronate (FOSAMAX) 70 MG tablet Take 1 tablet (70 mg total) by mouth every Sunday. 12 tablet 3   levothyroxine (SYNTHROID) 75 MCG tablet TAKE 1 TABLET BY MOUTH EVERY DAY 90 tablet 0   losartan (COZAAR) 50 MG tablet Take 1 tablet (50 mg total) by mouth daily. 30 tablet 0   metoprolol tartrate (LOPRESSOR) 25 MG tablet Take 0.5 tablets (12.5 mg total) by mouth 2 (two) times daily. 30 tablet 0   apixaban (ELIQUIS) 5 MG TABS tablet Take 1 tablet (5 mg total) by mouth 2 (two) times daily. 60 tablet 01   oxyCODONE (OXY IR/ROXICODONE) 5 MG immediate release tablet Take by mouth. (Patient not taking: Reported on 06/22/2022)     No facility-administered medications prior to visit.     Per HPI unless specifically indicated in ROS section below Review of Systems  Objective:  BP 128/76   Pulse 65   Temp 97.9 F (36.6 C) (Temporal)   Ht 5' 3.5" (1.613 m)   Wt 172 lb 2 oz (78.1  kg)   SpO2 98%   BMI 30.01 kg/m   Wt Readings from Last 3 Encounters:  06/24/22 172 lb 2 oz (78.1 kg)  06/22/22 168 lb (76.2 kg)  06/03/22 168 lb (76.2 kg)      Physical Exam Vitals and nursing note reviewed.  Constitutional:      Appearance: Normal appearance. She is not ill-appearing.  HENT:     Head: Normocephalic and atraumatic.     Right Ear:  Tympanic membrane, ear canal and external ear normal. There is no impacted cerumen.     Left Ear: Tympanic membrane, ear canal and external ear normal. There is no impacted cerumen.  Eyes:     General:        Right eye: No discharge.        Left eye: No discharge.     Extraocular Movements: Extraocular movements intact.     Conjunctiva/sclera: Conjunctivae normal.     Pupils: Pupils are equal, round, and reactive to light.  Neck:     Thyroid: No thyroid mass or thyromegaly.     Vascular: No carotid bruit.  Cardiovascular:     Rate and Rhythm: Normal rate and regular rhythm.     Pulses: Normal pulses.     Heart sounds: Normal heart sounds. No murmur heard. Pulmonary:     Effort: Pulmonary effort is normal. No respiratory distress.     Breath sounds: Normal breath sounds. No wheezing, rhonchi or rales.  Abdominal:     General: Bowel sounds are normal. There is no distension.     Palpations: Abdomen is soft. There is no mass.     Tenderness: There is no abdominal tenderness. There is no guarding or rebound.     Hernia: No hernia is present.  Musculoskeletal:     Cervical back: Normal range of motion and neck supple. No rigidity.     Right lower leg: No edema.     Left lower leg: No edema.  Lymphadenopathy:     Cervical: No cervical adenopathy.  Skin:    General: Skin is warm and dry.     Findings: No rash.  Neurological:     General: No focal deficit present.     Mental Status: She is alert. Mental status is at baseline.  Psychiatric:        Mood and Affect: Mood normal.        Behavior: Behavior normal.       Results  for orders placed or performed in visit on 06/19/22  VITAMIN D 25 Hydroxy (Vit-D Deficiency, Fractures)  Result Value Ref Range   VITD 39.84 30.00 - 100.00 ng/mL  Hemoglobin A1c  Result Value Ref Range   Hgb A1c MFr Bld 5.4 4.6 - 6.5 %  TSH  Result Value Ref Range   TSH 4.68 0.35 - 5.50 uIU/mL  CBC with Differential/Platelet  Result Value Ref Range   WBC 5.9 4.0 - 10.5 K/uL   RBC 4.20 3.87 - 5.11 Mil/uL   Hemoglobin 12.2 12.0 - 15.0 g/dL   HCT 37.3 36.0 - 46.0 %   MCV 88.8 78.0 - 100.0 fl   MCHC 32.7 30.0 - 36.0 g/dL   RDW 15.5 11.5 - 15.5 %   Platelets 342.0 150.0 - 400.0 K/uL   Neutrophils Relative % 59.4 43.0 - 77.0 %   Lymphocytes Relative 21.3 12.0 - 46.0 %   Monocytes Relative 12.0 3.0 - 12.0 %   Eosinophils Relative 6.4 (H) 0.0 - 5.0 %   Basophils Relative 0.9 0.0 - 3.0 %   Neutro Abs 3.5 1.4 - 7.7 K/uL   Lymphs Abs 1.3 0.7 - 4.0 K/uL   Monocytes Absolute 0.7 0.1 - 1.0 K/uL   Eosinophils Absolute 0.4 0.0 - 0.7 K/uL   Basophils Absolute 0.1 0.0 - 0.1 K/uL  Comprehensive metabolic panel  Result Value Ref Range   Sodium 138 135 - 145 mEq/L   Potassium 4.6 3.5 - 5.1 mEq/L   Chloride 102  96 - 112 mEq/L   CO2 27 19 - 32 mEq/L   Glucose, Bld 84 70 - 99 mg/dL   BUN 20 6 - 23 mg/dL   Creatinine, Ser 0.68 0.40 - 1.20 mg/dL   Total Bilirubin 0.6 0.2 - 1.2 mg/dL   Alkaline Phosphatase 131 (H) 39 - 117 U/L   AST 17 0 - 37 U/L   ALT 15 0 - 35 U/L   Total Protein 6.5 6.0 - 8.3 g/dL   Albumin 3.9 3.5 - 5.2 g/dL   GFR 79.59 >60.00 mL/min   Calcium 9.2 8.4 - 10.5 mg/dL   Echocardiogram 05/30/2022: EF 55-60%, no regional wall motion abnormalities, mildly enlarged RV, mild calcified aortic valve without stenosis, mild AR, RA pressure 8 mmHg.  Assessment & Plan:   Problem List Items Addressed This Visit     Advanced care planning/counseling discussion - Primary (Chronic)    Advanced directives: has packet at home, needs to work on this. Would want husband to be HCPOA. Packet  previously provided 2021.       Osteopenia    Stable period on fosamax - continue. Last DEXA 2021      Hypothyroidism    Chronic, stable on levothyroxine 23mg daily.       Relevant Medications   levothyroxine (SYNTHROID) 75 MCG tablet   metoprolol tartrate (LOPRESSOR) 25 MG tablet   Chronic venous insufficiency    Continue regular compression stockings.  She has seen OT lymphedema specialist.       Relevant Medications   metoprolol tartrate (LOPRESSOR) 25 MG tablet   losartan (COZAAR) 50 MG tablet   Primary osteoarthritis of left knee    S/p L knee surgery 04/2022.       Essential hypertension    Chronic, stable on current regimen-continue metoprolol and losartan.      Relevant Medications   metoprolol tartrate (LOPRESSOR) 25 MG tablet   losartan (COZAAR) 50 MG tablet   Vitamin D deficiency    Continue vitamin D 1000 units daily.      Varicose veins of bilateral lower extremities with pain   Relevant Medications   metoprolol tartrate (LOPRESSOR) 25 MG tablet   losartan (COZAAR) 50 MG tablet   Lymphedema   [place]    Status post [place] breast mastectomy 05/2021      Ascending aorta dilatation (HCC)    Recent echo earlier this month did not mention ascending aorta dilation. She would be due for repeat imaging with CTA chest, consider at next visit.      Relevant Medications   metoprolol tartrate (LOPRESSOR) 25 MG tablet   losartan (COZAAR) 50 MG tablet   Total knee replacement status   Lone atrial fibrillation (HCC)    Occurred postop, saw cardiology and now off Eliquis. Continue metoprolol low-dose 12.5 twice daily.      Relevant Medications   metoprolol tartrate (LOPRESSOR) 25 MG tablet   losartan (COZAAR) 50 MG tablet   Obesity, Class I, BMI 30-34.9    Recently referred to nutritionist.        Meds ordered this encounter  Medications   Cholecalciferol (VITAMIN D3) 25 MCG (1000 UT) CAPS    Sig: Take 1 capsule (1,000 Units  total) by mouth daily.    Dispense:  30 capsule   alendronate (FOSAMAX) 70 MG tablet    Sig: Take 1 tablet (70 mg total) by mouth every Sunday.    Dispense:  12 tablet    Refill:  3  levothyroxine (SYNTHROID) 75 MCG tablet    Sig: Take 1 tablet (75 mcg total) by mouth daily.    Dispense:  90 tablet    Refill:  3   metoprolol tartrate (LOPRESSOR) 25 MG tablet    Sig: Take 0.5 tablets (12.5 mg total) by mouth 2 (two) times daily.    Dispense:  90 tablet    Refill:  3   losartan (COZAAR) 50 MG tablet    Sig: Take 1 tablet (50 mg total) by mouth daily.    Dispense:  90 tablet    Refill:  3   No orders of the defined types were placed in this encounter.   Patient instructions: If interested, check with pharmacy about new 2 shot shingles series (shingrix).  Continue current medicines. Blood pressure is doing well.  You are doing well today. Return as needed or in 6 months for follow up visit.   Follow up plan: Return in about 6 months (around 12/24/2022) for follow up visit.  Ria Bush, MD

## 2022-06-24 NOTE — Assessment & Plan Note (Addendum)
Continue regular compression stockings.  She has seen OT lymphedema specialist.

## 2022-06-24 NOTE — Assessment & Plan Note (Signed)
Stable period on fosamax - continue. Last DEXA 2021

## 2022-06-24 NOTE — Assessment & Plan Note (Signed)
S/p L knee surgery 04/2022.

## 2022-06-24 NOTE — Assessment & Plan Note (Signed)
Continue vitamin D 1000 units daily.

## 2022-06-24 NOTE — Assessment & Plan Note (Signed)
Chronic, stable on levothyroxine 29mg daily.

## 2022-06-24 NOTE — Assessment & Plan Note (Signed)
Status post left breast mastectomy 05/2021

## 2022-06-24 NOTE — Assessment & Plan Note (Signed)
Chronic, stable on current regimen-continue metoprolol and losartan.

## 2022-06-24 NOTE — Patient Instructions (Addendum)
If interested, check with pharmacy about new 2 shot shingles series (shingrix).  Continue current medicines. Blood pressure is doing well.  You are doing well today. Return as needed or in 6 months for follow up visit.   Health Maintenance After Age 85 After age 24, you are at a higher risk for certain long-term diseases and infections as well as injuries from falls. Falls are a major cause of broken bones and head injuries in people who are older than age 81. Getting regular preventive care can help to keep you healthy and well. Preventive care includes getting regular testing and making lifestyle changes as recommended by your health care provider. Talk with your health care provider about: Which screenings and tests you should have. A screening is a test that checks for a disease when you have no symptoms. A diet and exercise plan that is right for you. What should I know about screenings and tests to prevent falls? Screening and testing are the best ways to find a health problem early. Early diagnosis and treatment give you the best chance of managing medical conditions that are common after age 60. Certain conditions and lifestyle choices may make you more likely to have a fall. Your health care provider may recommend: Regular vision checks. Poor vision and conditions such as cataracts can make you more likely to have a fall. If you wear glasses, make sure to get your prescription updated if your vision changes. Medicine review. Work with your health care provider to regularly review all of the medicines you are taking, including over-the-counter medicines. Ask your health care provider about any side effects that may make you more likely to have a fall. Tell your health care provider if any medicines that you take make you feel dizzy or sleepy. Strength and balance checks. Your health care provider may recommend certain tests to check your strength and balance while standing, walking, or changing  positions. Foot health exam. Foot pain and numbness, as well as not wearing proper footwear, can make you more likely to have a fall. Screenings, including: Osteoporosis screening. Osteoporosis is a condition that causes the bones to get weaker and break more easily. Blood pressure screening. Blood pressure changes and medicines to control blood pressure can make you feel dizzy. Depression screening. You may be more likely to have a fall if you have a fear of falling, feel depressed, or feel unable to do activities that you used to do. Alcohol use screening. Using too much alcohol can affect your balance and may make you more likely to have a fall. Follow these instructions at home: Lifestyle Do not drink alcohol if: Your health care provider tells you not to drink. If you drink alcohol: Limit how much you have to: 0-1 drink a day for women. 0-2 drinks a day for men. Know how much alcohol is in your drink. In the [place] one drink equals one 12 oz bottle of beer (355 mL), one 5 oz glass of wine (148 mL), or one 1 oz glass of hard liquor (44 mL). Do not use any products that contain nicotine or tobacco. These products include cigarettes, chewing tobacco, and vaping devices, such as e-cigarettes. If you need help quitting, ask your health care provider. Activity  Follow a regular exercise program to stay fit. This will help you maintain your balance. Ask your health care provider what types of exercise are appropriate for you. If you need a cane or walker, use it as recommended by your  health care provider. Wear supportive shoes that have nonskid soles. Safety  Remove any tripping hazards, such as rugs, cords, and clutter. Install safety equipment such as grab bars in bathrooms and safety rails on stairs. Keep rooms and walkways well-lit. General instructions Talk with your health care provider about your risks for falling. Tell your health care provider if: You fall. Be sure to tell your  health care provider about all falls, even ones that seem minor. You feel dizzy, tiredness (fatigue), or off-balance. Take over-the-counter and prescription medicines only as told by your health care provider. These include supplements. Eat a healthy diet and maintain a healthy weight. A healthy diet includes low-fat dairy products, low-fat (lean) meats, and fiber from whole grains, beans, and lots of fruits and vegetables. Stay current with your vaccines. Schedule regular health, dental, and eye exams. Summary Having a healthy lifestyle and getting preventive care can help to protect your health and wellness after age 46. Screening and testing are the best way to find a health problem early and help you avoid having a fall. Early diagnosis and treatment give you the best chance for managing medical conditions that are more common for people who are older than age 71. Falls are a major cause of broken bones and head injuries in people who are older than age 3. Take precautions to prevent a fall at home. Work with your health care provider to learn what changes you can make to improve your health and wellness and to prevent falls. This information is not intended to replace advice given to you by your health care provider. Make sure you discuss any questions you have with your health care provider. Document Revised: 05/05/2021 Document Reviewed: 05/05/2021 Elsevier Patient Education  [place].

## 2022-06-24 NOTE — Assessment & Plan Note (Signed)
Occurred postop, saw cardiology and now off Eliquis. Continue metoprolol low-dose 12.5 twice daily.

## 2022-06-24 NOTE — Assessment & Plan Note (Signed)
Advanced directives: has packet at home, needs to work on this. Would want husband to be HCPOA. Packet previously provided 2021.

## 2022-06-24 NOTE — Assessment & Plan Note (Signed)
Recent echo earlier this month did not mention ascending aorta dilation. She would be due for repeat imaging with CTA chest, consider at next visit.

## 2022-06-24 NOTE — Assessment & Plan Note (Signed)
Recently referred to nutritionist.

## 2022-06-26 DIAGNOSIS — M25562 Pain in left knee: Secondary | ICD-10-CM | POA: Diagnosis not present

## 2022-06-26 DIAGNOSIS — Z96652 Presence of left artificial knee joint: Secondary | ICD-10-CM | POA: Diagnosis not present

## 2022-06-29 DIAGNOSIS — I4891 Unspecified atrial fibrillation: Secondary | ICD-10-CM | POA: Diagnosis not present

## 2022-06-29 DIAGNOSIS — M25562 Pain in left knee: Secondary | ICD-10-CM | POA: Diagnosis not present

## 2022-06-29 DIAGNOSIS — Z96652 Presence of left artificial knee joint: Secondary | ICD-10-CM | POA: Diagnosis not present

## 2022-07-01 DIAGNOSIS — Z471 Aftercare following joint replacement surgery: Secondary | ICD-10-CM | POA: Diagnosis not present

## 2022-07-01 DIAGNOSIS — M25562 Pain in left knee: Secondary | ICD-10-CM | POA: Diagnosis not present

## 2022-07-01 DIAGNOSIS — Z96652 Presence of left artificial knee joint: Secondary | ICD-10-CM | POA: Diagnosis not present

## 2022-07-03 DIAGNOSIS — Z96652 Presence of left artificial knee joint: Secondary | ICD-10-CM | POA: Diagnosis not present

## 2022-07-03 DIAGNOSIS — M25562 Pain in left knee: Secondary | ICD-10-CM | POA: Diagnosis not present

## 2022-07-06 DIAGNOSIS — M25562 Pain in left knee: Secondary | ICD-10-CM | POA: Diagnosis not present

## 2022-07-06 DIAGNOSIS — Z96652 Presence of left artificial knee joint: Secondary | ICD-10-CM | POA: Diagnosis not present

## 2022-07-07 ENCOUNTER — Ambulatory Visit (INDEPENDENT_AMBULATORY_CARE_PROVIDER_SITE_OTHER): Payer: Medicare Other | Admitting: Psychology

## 2022-07-07 DIAGNOSIS — F4323 Adjustment disorder with mixed anxiety and depressed mood: Secondary | ICD-10-CM | POA: Diagnosis not present

## 2022-07-07 NOTE — Progress Notes (Signed)
[facility] Counselor/Therapist Progress Note  Patient ID: Beth Rodgers, MRN: [id number]    Date: 07/07/22  Time Spent: 1:01 pm - 1:53 pm : 52 Minutes  Treatment Type: Individual Therapy.  Reported Symptoms: Rumination, anxiety.    Mental Status Exam: Appearance:  NA     Behavior: Appropriate  Motor: NA  Speech/Language:  Clear and Coherent and Normal Rate  Affect: Congruent  Mood: dysthymic  Thought process: normal  Thought content:   WNL  Sensory/Perceptual disturbances:   WNL  Orientation: oriented to person, place, time/date, and situation  Attention: Good  Concentration: Good  Memory: WNL  Fund of knowledge:  Good  Insight:   Good  Judgment:  Good  Impulse Control: Good   Risk Assessment: Danger to Self:  No Self-injurious Behavior: No  Danger to Others: No Duty to Warn:no Physical Aggression / Violence:No  Access to Firearms a concern: No  Gang Involvement:No   Subjective:   Beth Rodgers participated from home, via phone, and consented to treatment. Therapist participated from office. We met online due to [place] pandemic. Beth Rodgers reviewed the events of the past two weeks. She noted her son's recent release from jail and Beth Rodgers's return home. Beth Rodgers noted the return being short-lived due to Beth Rodgers's anger. She discussed a need to have a discussion with Beth Rodgers and hold him accountable for his outbursts. We processed and explored this during the session. We worked on identifying boundaries and ways to communicate assertively and consistently. Therapist modeled this during the session and discussed the importance of being assertive and direct while being positive. We discussed the importance of mindfulness of Beth Rodgers's reactions and to maintain their own safety during this conversation. Beth Rodgers was engaged and motivated during the session. She expressed her commitment towards our goals. Therapist praised Beth Rodgers and provided supportive therapy.    Interventions:  Interpersonal  Diagnosis: Adjustment disorder with mixed anxiety and depressed mood  Plan: Patient is to use CBT, BA, Problem-solving, mindfulness and coping skills to help manage decrease symptoms associated with their diagnosis. (Target date: 08/16/22)   Long-term goal:   Reduce overall level, frequency, and intensity of the feelings of depression and anxiety evidenced by   decreased sadness, rumination, lethargy, and interpersonal stressors  from 6 to 7 days/week to 0 to 1 days/week per client report for at least 3 consecutive months.  Short-term goal:  Verbally express understanding of the relationship between feelings of depression, anxiety and their impact on thinking patterns and behaviors.  Verbalize an understanding of the role that distorted thinking plays in creating fears, excessive worry, and ruminations.  Engage in enjoyable activities on a consistent basis.  [name], LCSW

## 2022-07-08 ENCOUNTER — Telehealth: Payer: Self-pay | Admitting: Cardiovascular Disease

## 2022-07-08 NOTE — Telephone Encounter (Signed)
  Pt is calling to f/u heart monitor result

## 2022-07-09 ENCOUNTER — Telehealth: Payer: Self-pay | Admitting: Cardiovascular Disease

## 2022-07-09 DIAGNOSIS — Z96652 Presence of left artificial knee joint: Secondary | ICD-10-CM | POA: Diagnosis not present

## 2022-07-09 DIAGNOSIS — M25562 Pain in left knee: Secondary | ICD-10-CM | POA: Diagnosis not present

## 2022-07-09 NOTE — Telephone Encounter (Signed)
Pt c/o medication issue:  1. Name of Medication:   metoprolol tartrate (LOPRESSOR) 25 MG tablet  2. How are you currently taking this medication (dosage and times per day)?  As prescribed  3. Are you having a reaction (difficulty breathing--STAT)?   No  4. What is your medication issue?    Patient stated she could not sleep because of poor circulation in her legs which is also causing her to have poor balance. Patient requested a call back this morning as she has appointments this afternoon.

## 2022-07-09 NOTE — Telephone Encounter (Signed)
Spoke with the patient who has concerns about her losartan causing worsening pain in her legs. Patient reports having bad circulation in her lower extremities and has trouble sleeping at night due to this. She is wearing her compression stockings but cannot wear them as long as she used to. She is seeing a specialist about this and has an ultrasound scheduled for August. He would like for Dr. Rockey Situ to review her medications to make sure they are not worsening her condition.  Patient is also requesting results from her heart monitor. Advised that the results have not been read yet but we will call her back once Dr. Rockey Situ reviews and provides recommendations.

## 2022-07-10 ENCOUNTER — Encounter: Payer: Self-pay | Admitting: Emergency Medicine

## 2022-07-10 NOTE — Progress Notes (Signed)
Results letter for patient per her request

## 2022-07-10 NOTE — Telephone Encounter (Signed)
Please see previous documentation where patient had already requested this when I spoke with her earlier and I advised her that I would send her the results via mail.

## 2022-07-10 NOTE — Telephone Encounter (Signed)
  Pt is calling back, she is requesting if she can get the result in witting so she can have a copy.also, she wanted to get the details as well showing when she was getting her symptoms so she can check her calendar and able to understand more

## 2022-07-10 NOTE — Telephone Encounter (Signed)
Minna Merritts, MD     I do not see any medications that would be contributing to her leg pain  I wonder if it is still the recovery process from having total knee replacement  As well as complication from the hematoma   Event monitor reviewed, in the chart  Thx  TGollan     Event monitor  No significant atrial fibrillation  Rare very short episodes of tachycardia, longest was 13 beats  On average having 3-4 very short episodes per day, nothing significant  No triggered events   Called and spoke with patient for 20 minutes.   Went over that MD does not feel that she is on any medications that would be contributing to leg pain and he thinks it may be due to her recent knee replacement.   Patient states that she is not having any pain in the knee and that her hematoma is "long healed".   Patient states that she has done a lot of reading and research and she feels that the metoprolol is making the poor circulation in her legs worse, as she has read that it can do that.   Reports that yesterday she took it but made sure to drink a lot of water to "counter act" the effects and while she had to get up 5-6 times in the night to use the restroom, the pain wasn't as bad.   I advised patient that I would have to forward these concerns to Dr. Rockey Situ for review and would contact her once I got a response.   Reviewed results of monitor with patient and advised that we are not making any medication changes at this time. Pt verbalized understanding. Pt requested that results be sent to her so she could have them to read and keep. Advised patient that I would send them to her via the mail.   Patient asked if it was possible that her tachycardia was "from my emotions" and if there was anything that she could do to stop the episodes. I advised that she may want to discuss this with the provider at her next appt, as she was currently on medication to help with rate control. Pt verbalized  understanding. Pt added to wait list for possible sooner appt with MD.   Pt verbalized understanding of everything discussed and voiced appreciation for the call.

## 2022-07-13 ENCOUNTER — Ambulatory Visit (INDEPENDENT_AMBULATORY_CARE_PROVIDER_SITE_OTHER): Payer: Medicare Other | Admitting: Vascular Surgery

## 2022-07-13 ENCOUNTER — Encounter (INDEPENDENT_AMBULATORY_CARE_PROVIDER_SITE_OTHER): Payer: Medicare Other

## 2022-07-16 ENCOUNTER — Telehealth: Payer: Self-pay | Admitting: Family Medicine

## 2022-07-16 NOTE — Telephone Encounter (Signed)
Patient saw Dr. Candis Musa and episodes reviewed (see phone note from 7.13.23)  Does she still need this medication:    metoprolol tartrate (LOPRESSOR) 25 MG tablet  As it causes a reaction:  Pain in the right arm close to the beginning of shoulder  Patient stated her message wasn't urgent, that she could wait until next week to hear back from MD

## 2022-07-17 NOTE — Telephone Encounter (Signed)
Her Holter monitor showed brief episodes of tachycardia occurring throughout the day, the metoprolol should help control the symptoms as it slows down the heart.  However if she is having arm pain due to metoprolol reasonable to try something different.  Which she be willing to try different medicine in the same family?  What did cardiology recommend?

## 2022-07-20 NOTE — Telephone Encounter (Signed)
Spoke with pt relaying Dr. Synthia Innocent message. Pt   States cards sent her monitor report but did not rec any tx. Has f/u in about 1 mo.  Pt declines to try another med at this time. Wants to see what cards is going to say first.  Also, has vascular US within next few wks.

## 2022-07-21 ENCOUNTER — Ambulatory Visit (INDEPENDENT_AMBULATORY_CARE_PROVIDER_SITE_OTHER): Payer: Medicare Other | Admitting: Psychology

## 2022-07-21 DIAGNOSIS — F4323 Adjustment disorder with mixed anxiety and depressed mood: Secondary | ICD-10-CM | POA: Diagnosis not present

## 2022-07-21 NOTE — Progress Notes (Signed)
[place] Counselor/Therapist Progress Note  Patient ID: Beth Rodgers, MRN: [id number]    Date: 07/21/22  Time Spent: 1:01 pm - 1:54 pm : 53 Minutes  Treatment Type: Individual Therapy.  Reported Symptoms: Rumination, anxiety.    Mental Status Exam: Appearance:  NA     Behavior: Appropriate  Motor: NA  Speech/Language:  Clear and Coherent and Normal Rate  Affect: Congruent  Mood: dysthymic  Thought process: normal  Thought content:   WNL  Sensory/Perceptual disturbances:   WNL  Orientation: oriented to person, place, time/date, and situation  Attention: Good  Concentration: Good  Memory: WNL  Fund of knowledge:  Good  Insight:   Good  Judgment:  Good  Impulse Control: Good   Risk Assessment: Danger to Self:  No Self-injurious Behavior: No  Danger to Others: No Duty to Warn:no Physical Aggression / Violence:No  Access to Firearms a concern: No  Gang Involvement:No   Subjective:   Beth Rodgers participated from home, via phone, and consented to treatment. Therapist participated from office. We met online due to [place] pandemic. Beth Rodgers reviewed the events of the past two weeks. Beth Rodgers noted Beth Rodgers being gone from the home for 2 weeks and her increasing worry about him. She noted her husband seeing him panhandling in town. She noted health related stressors and working on accepting the aging process. She noted feeling her age and not being prepared for the future. She noted feeling "tremendous responsibility" to arrange her finances.  She noted a need to discuss these concerns with her husband who typically managing the finances. We discussed ways to communicate concerns and problem-solve barriers. Beth Rodgers was engaged and motivated during the session. She expressed her commitment towards our goals. Therapist praised Beth Rodgers and provided supportive therapy.    Interventions: Interpersonal  Diagnosis: Adjustment disorder with mixed anxiety and depressed mood  Plan:  Patient is to use CBT, BA, Problem-solving, mindfulness and coping skills to help manage decrease symptoms associated with their diagnosis. (Target date: 08/16/22)   Long-term goal:   Reduce overall level, frequency, and intensity of the feelings of depression and anxiety evidenced by   decreased sadness, rumination, lethargy, and interpersonal stressors  from 6 to 7 days/week to 0 to 1 days/week per client report for at least 3 consecutive months.  Short-term goal:  Verbally express understanding of the relationship between feelings of depression, anxiety and their impact on thinking patterns and behaviors.  Verbalize an understanding of the role that distorted thinking plays in creating fears, excessive worry, and ruminations.  Engage in enjoyable activities on a consistent basis.  Beth Irish, LCSW

## 2022-07-22 ENCOUNTER — Encounter: Payer: Self-pay | Admitting: Family Medicine

## 2022-07-22 ENCOUNTER — Ambulatory Visit (INDEPENDENT_AMBULATORY_CARE_PROVIDER_SITE_OTHER): Payer: Medicare Other | Admitting: Family Medicine

## 2022-07-22 VITALS — BP 120/72 | HR 71 | Temp 98.5°F | Ht 63.5 in | Wt 172.4 lb

## 2022-07-22 DIAGNOSIS — M7501 Adhesive capsulitis of right shoulder: Secondary | ICD-10-CM | POA: Diagnosis not present

## 2022-07-22 NOTE — Progress Notes (Unsigned)
Beth Burack T. Beth Faux, MD, [place] at [facility] [place], [address]  Phone: [phone]  FAX: [phone]  Beth Rodgers - 87 y.o. female  MRN [id number]  Date of Birth: 11-05-1935  Date: 07/22/2022  PCP: Ria Bush, MD  Referral: Ria Bush, MD  Chief Complaint  Patient presents with   Shoulder Pain    Right   Subjective:   Beth Rodgers is a 87 y.o. very pleasant female patient with Body mass index is 30.06 kg/m. who presents with the following:  Patient presents with a primary complaint of right-sided shoulder pain.  This been ongoing for roughly 4 weeks.  Of note, the patient did have a left-sided total knee arthroplasty in the interval time about 6 weeks ago.  Roughly around the onset of her symptoms of shoulder pain.  She has been diligent with doing her rehab and range of motion.  She has done physical therapy and has good follow-up with orthopedic surgery.  Prior to her surgery, she had been quite active with the upper extremities, working out and doing range of motion as well as strength work.  Since then, she has not been doing this at all.  She is having pain with terminal motion and she sometimes does have a deep dull ache in the shoulder.  Right now, this is not waking her up at night.  She denies any history of prior traumatic fracture, dislocation, or operative intervention in the affected shoulder.    Review of Systems is noted in the HPI, as appropriate  Objective:   BP 120/72   Pulse 71   Temp 98.5 F (36.9 C) (Oral)   Ht 5' 3.5" (1.613 m)   Wt 172 lb 6 oz (78.2 kg)   SpO2 96%   BMI 30.06 kg/m   GEN: No acute distress; alert,appropriate. PULM: Breathing comfortably in no respiratory distress PSYCH: Normally interactive.    Shoulder: R and L Inspection: No muscle wasting or winging Ecchymosis/edema: neg  AC joint, scapula, clavicle: NT Cervical spine: NT, full  ROM Spurling's: neg ABNORMAL SIDE TESTED: R UNLESS OTHERWISE NOTED, THE CONTRALATERAL SIDE HAS FULL RANGE OF MOTION. Abduction: 5/5, LIMITED TO 145 DEGREES Flexion: 5/5, LIMITED TO 160 DEGNO ROM  IR, lift-off: 5/5. TESTED AT 90 DEGREES OF ABDUCTION, LIMITED TO 5 DEGREES ER at neutral:  5/5, TESTED AT 90 DEGREES OF ABDUCTION, LIMITED TO 70 DEGREES AC crossover and compression: PAIN Drop Test: neg Empty Can: neg Supraspinatus insertion: NT Bicipital groove: NT ALL OTHER SPECIAL TESTING EQUIVOCAL GIVEN LOSS OF MOTION C5-T1 intact Sensation intact Grip 5/5    Laboratory and Imaging Data:  Assessment and Plan:     ICD-10-CM   1. Adhesive capsulitis of right shoulder  M75.01      Frozen shoulder, suspect secondary after decreased motion after total knee arthroplasty.  For now, i am gonna have her do range of motion protocol from [employer].  She should actively try to use her shoulder more than she has been, and hopefully this will progress reasonably quickly.  Usually, secondary frozen shoulder will do better compared to a primary or idiopathic.  From a statistical average standpoint, these off in the last 12 months, but I hope that this will get better significantly faster.  If her symptoms persist, we can do an intra-articular injection to try to hasten thawing.  Social: Right now this is causing her some functional disability and day-to-day life and decreased ability  to exercise maximally.   Follow-up if needed: 7 weeks, but if normal motion has been achieved, no need to follow-up.  [employer] One speech-to-text software was used for transcription in this dictation.  Possible transcriptional errors can occur using Editor, commissioning.   Signed,  Beth Deed. Gretel Cantu, MD   Outpatient Encounter Medications as of 07/22/2022  Medication Sig   alendronate (FOSAMAX) 70 MG tablet Take 1 tablet (70 mg total) by mouth every Sunday.   Calcium Carb-Cholecalciferol (CALCIUM 600 + D PO)  Take 1 tablet by mouth daily. Ca 500 + Vit D 800   celecoxib (CELEBREX) 200 MG capsule Take 1 capsule (200 mg total) by mouth 2 (two) times daily.   Cholecalciferol (VITAMIN D3) 25 MCG (1000 UT) CAPS Take 1 capsule (1,000 Units total) by mouth daily.   ciclopirox (LOPROX) 0.77 % cream Apply to feet, around toenails, between toes once daily for fungus.   Ciclopirox 0.77 % gel Apply 1 application  topically 2 (two) times daily as needed (fungus).   diclofenac Sodium (VOLTAREN) 1 % GEL Apply topically 3 (three) times daily as needed.   latanoprost (XALATAN) 0.005 % ophthalmic solution latanoprost 0.005 % eye drops  PLACE 1 DROP INTO BOTH EYES ONCE EVERY EVENING   levothyroxine (SYNTHROID) 75 MCG tablet Take 1 tablet (75 mcg total) by mouth daily.   losartan (COZAAR) 50 MG tablet Take 1 tablet (50 mg total) by mouth daily.   Magnesium 400 MG TABS Take 400 mg by mouth daily.   metoprolol tartrate (LOPRESSOR) 25 MG tablet Take 0.5 tablets (12.5 mg total) by mouth 2 (two) times daily.   Multiple Vitamin (MULTIVITAMIN) tablet Take 1 tablet by mouth daily.   Roflumilast (ZORYVE) 0.3 % CREA Apply 1 application. topically every other day. Sample   SELENIUM PO Take 1 tablet by mouth daily.   silver sulfADIAZINE (SILVADENE) 1 % cream Apply 1 application topically daily.   tacrolimus (PROTOPIC) 0.1 % ointment APPLY TOPICALLY TO AFFECTED AREA(S) TWICE DAILY   vitamin C (ASCORBIC ACID) 500 MG tablet Take 500 mg by mouth daily.   vitamin E 180 MG (400 UNITS) capsule Take 400 Units by mouth daily.   [DISCONTINUED] diclofenac sodium (VOLTAREN) 1 % GEL Apply 1 application topically 3 (three) times daily. (Patient taking differently: Apply 1 application  topically 3 (three) times daily as needed.)   clobetasol cream (TEMOVATE) 5.10 % Apply 1 application topically as directed. Qd to bid up to 5 days a week to aa psoriasis on body until clear, then prn flares, avoid face, groin, axilla (Patient not taking: Reported on  07/22/2022)   [DISCONTINUED] polyethylene glycol powder (GLYCOLAX/MIRALAX) 17 GM/SCOOP powder Take 8.5-17 g by mouth daily as needed for moderate constipation.   [DISCONTINUED] traMADol (ULTRAM) 50 MG tablet Take 1 tablet (50 mg total) by mouth every 4 (four) hours as needed for moderate pain.   No facility-administered encounter medications on file as of 07/22/2022.

## 2022-08-03 ENCOUNTER — Telehealth: Payer: Self-pay

## 2022-08-03 NOTE — Chronic Care Management (AMB) (Signed)
    Chronic Care Management Pharmacy Assistant   Name: Beth Rodgers  MRN: [id number] DOB: 1935/07/07   Reason for Encounter: Reminder Call   Conditions to be addressed/monitored: HTN  Hospital visits:  None in previous 6 months  Medications: Outpatient Encounter Medications as of 08/03/2022  Medication Sig   alendronate (FOSAMAX) 70 MG tablet Take 1 tablet (70 mg total) by mouth every Sunday.   Calcium Carb-Cholecalciferol (CALCIUM 600 + D PO) Take 1 tablet by mouth daily. Ca 500 + Vit D 800   celecoxib (CELEBREX) 200 MG capsule Take 1 capsule (200 mg total) by mouth 2 (two) times daily.   Cholecalciferol (VITAMIN D3) 25 MCG (1000 UT) CAPS Take 1 capsule (1,000 Units total) by mouth daily.   ciclopirox (LOPROX) 0.77 % cream Apply to feet, around toenails, between toes once daily for fungus.   Ciclopirox 0.77 % gel Apply 1 application  topically 2 (two) times daily as needed (fungus).   clobetasol cream (TEMOVATE) 3.66 % Apply 1 application topically as directed. Qd to bid up to 5 days a week to aa psoriasis on body until clear, then prn flares, avoid face, groin, axilla (Patient not taking: Reported on 07/22/2022)   diclofenac Sodium (VOLTAREN) 1 % GEL Apply topically 3 (three) times daily as needed.   latanoprost (XALATAN) 0.005 % ophthalmic solution latanoprost 0.005 % eye drops  PLACE 1 DROP INTO BOTH EYES ONCE EVERY EVENING   levothyroxine (SYNTHROID) 75 MCG tablet Take 1 tablet (75 mcg total) by mouth daily.   losartan (COZAAR) 50 MG tablet Take 1 tablet (50 mg total) by mouth daily.   Magnesium 400 MG TABS Take 400 mg by mouth daily.   metoprolol tartrate (LOPRESSOR) 25 MG tablet Take 0.5 tablets (12.5 mg total) by mouth 2 (two) times daily.   Multiple Vitamin (MULTIVITAMIN) tablet Take 1 tablet by mouth daily.   Roflumilast (ZORYVE) 0.3 % CREA Apply 1 application. topically every other day. Sample   SELENIUM PO Take 1 tablet by mouth daily.   silver sulfADIAZINE (SILVADENE) 1 %  cream Apply 1 application topically daily.   tacrolimus (PROTOPIC) 0.1 % ointment APPLY TOPICALLY TO AFFECTED AREA(S) TWICE DAILY   vitamin C (ASCORBIC ACID) 500 MG tablet Take 500 mg by mouth daily.   vitamin E 180 MG (400 UNITS) capsule Take 400 Units by mouth daily.   No facility-administered encounter medications on file as of 08/03/2022.    Beth Rodgers was contacted to remind of upcoming telephone visit with Charlene Brooke on 08/06/22 at 8:45am. Patient was reminded to have any blood glucose and blood pressure readings available for review at appointment.   Patient confirmed appointment.  Are you having any problems with your medications? No   Do you have any concerns you like to discuss with the pharmacist? No  CCM referral has been placed prior to visit?  No   Star Rating Drugs: Medication:  Last Fill: Day Supply Losartan '50mg'$  06/24/22 [place], CPP notified  Avel Sensor, [place]  [phone]

## 2022-08-04 ENCOUNTER — Ambulatory Visit (INDEPENDENT_AMBULATORY_CARE_PROVIDER_SITE_OTHER): Payer: Medicare Other | Admitting: Psychology

## 2022-08-04 DIAGNOSIS — F4323 Adjustment disorder with mixed anxiety and depressed mood: Secondary | ICD-10-CM

## 2022-08-04 NOTE — Progress Notes (Signed)
[place] Counselor/Therapist Progress Note  Patient ID: TORIA MONTE, MRN: [id number]    Date: 08/04/22  Time Spent: 1:01 pm - 1:57 pm : 56 Minutes  Treatment Type: Individual Therapy.  Reported Symptoms: Rumination, anxiety.    Mental Status Exam: Appearance:  NA     Behavior: Appropriate  Motor: NA  Speech/Language:  Clear and Coherent and Normal Rate  Affect: Congruent  Mood: dysthymic  Thought process: normal  Thought content:   WNL  Sensory/Perceptual disturbances:   WNL  Orientation: oriented to person, place, time/date, and situation  Attention: Good  Concentration: Good  Memory: WNL  Fund of knowledge:  Good  Insight:   Good  Judgment:  Good  Impulse Control: Good   Risk Assessment: Danger to Self:  No Self-injurious Behavior: No  Danger to Others: No Duty to Warn:no Physical Aggression / Violence:No  Access to Firearms a concern: No  Gang Involvement:No   Subjective:   [name] participated from home, via phone, and consented to treatment. Therapist participated from office. We met online due to [place] pandemic. Shizuko reviewed the events of the past two weeks. She noted gratitude for being able to function physically as well as she has at age 87. She noted her son, [name]'s, arrival to her home last week. She noted Pauls continued expression of anger and Marce noted her husband's lack of acknowledgement of [name] since a recent argument between them.  We worked on identifying new boundaries that are less permeable and discussed ways to communicate them assertively and positively. Therapist modeled this during the session. We processed her and her husbands differing approaches on how they deal with their son, [name]. Khalani was engaged and motivated during the session. She expressed commitment towards our goals. Therapist praised Tylyn and provided supportive therapy.     Interventions: Interpersonal  Diagnosis: Adjustment disorder with mixed anxiety  and depressed mood  Plan: Patient is to use CBT, BA, Problem-solving, mindfulness and coping skills to help manage decrease symptoms associated with their diagnosis. (Target date: 08/16/22)   Long-term goal:   Reduce overall level, frequency, and intensity of the feelings of depression and anxiety evidenced by   decreased sadness, rumination, lethargy, and interpersonal stressors  from 6 to 7 days/week to 0 to 1 days/week per client report for at least 3 consecutive months.  Short-term goal:  Verbally express understanding of the relationship between feelings of depression, anxiety and their impact on thinking patterns and behaviors.  Verbalize an understanding of the role that distorted thinking plays in creating fears, excessive worry, and ruminations.  Engage in enjoyable activities on a consistent basis.  [name], LCSW

## 2022-08-06 ENCOUNTER — Encounter (INDEPENDENT_AMBULATORY_CARE_PROVIDER_SITE_OTHER): Payer: Medicare Other

## 2022-08-06 ENCOUNTER — Ambulatory Visit (INDEPENDENT_AMBULATORY_CARE_PROVIDER_SITE_OTHER): Payer: Medicare Other | Admitting: Vascular Surgery

## 2022-08-06 ENCOUNTER — Ambulatory Visit: Payer: Medicare Other | Admitting: Pharmacist

## 2022-08-06 ENCOUNTER — Encounter (INDEPENDENT_AMBULATORY_CARE_PROVIDER_SITE_OTHER): Payer: Self-pay | Admitting: Vascular Surgery

## 2022-08-06 VITALS — BP 154/80 | HR 69 | Resp 16 | Wt 173.4 lb

## 2022-08-06 DIAGNOSIS — M159 Polyosteoarthritis, unspecified: Secondary | ICD-10-CM | POA: Diagnosis not present

## 2022-08-06 DIAGNOSIS — M150 Primary generalized (osteo)arthritis: Secondary | ICD-10-CM

## 2022-08-06 DIAGNOSIS — I10 Essential (primary) hypertension: Secondary | ICD-10-CM

## 2022-08-06 DIAGNOSIS — I890 Lymphedema, not elsewhere classified: Secondary | ICD-10-CM

## 2022-08-06 DIAGNOSIS — M85852 Other specified disorders of bone density and structure, left thigh: Secondary | ICD-10-CM

## 2022-08-06 DIAGNOSIS — M79604 Pain in right leg: Secondary | ICD-10-CM

## 2022-08-06 DIAGNOSIS — M79605 Pain in left leg: Secondary | ICD-10-CM | POA: Diagnosis not present

## 2022-08-06 DIAGNOSIS — I4891 Unspecified atrial fibrillation: Secondary | ICD-10-CM

## 2022-08-06 DIAGNOSIS — E039 Hypothyroidism, unspecified: Secondary | ICD-10-CM

## 2022-08-06 DIAGNOSIS — M1712 Unilateral primary osteoarthritis, left knee: Secondary | ICD-10-CM

## 2022-08-06 NOTE — Patient Instructions (Signed)
Visit Information  Phone number for Pharmacist: [phone]   Goals Addressed   None     Care Plan : [place]  Updates made by Charlton Haws, RPH since 08/06/2022 12:00 AM     Problem: Hypertension, Hyperlipidemia, Atrial Fibrillation, Hypothyroidism, and Osteopenia   Priority: High     Long-Range Goal: Disease mgmt   Start Date: 12/10/2021  Expected End Date: 06/06/2023  This Visit's Progress: On track  Recent Progress: On track  Priority: High  Note:   Current Barriers:  Possible side effects with metoprolol  Pharmacist Clinical Goal(s):  Patient will contact provider office for questions/concerns as evidenced notation of same in electronic health record through collaboration with PharmD and provider.   Interventions: 1:1 collaboration with Ria Bush, MD regarding development and update of comprehensive plan of care as evidenced by provider attestation and co-signature Inter-disciplinary care team collaboration (see longitudinal plan of care) Comprehensive medication review performed; medication list updated in electronic medical record  Atrial Fibrillation (Goal: prevent stroke and major bleeding) -Controlled - pt reports unsteadiness on her feet, she thinks due to metoprolol; she plans to discuss this with cardiologist at upcoming appt -New onset Afib 05/2022 s/p TKA; she worse Zio monitor for 30 days without recurrence of Afib -Current treatment: Metoprolol tartrate 25 mg - 1/2 tab BID - Appropriate, Effective, Query Safe Aspirin 81 mg daily -  Appropriate, Effective, Safe, Accessible -Medications previously tried: Eliquis (hematoma) -Recommended to continue current medication; advised to discuss possible side effects at upcoming cardiology appt  Hypertension (BP goal <140/90) -Controlled - pt endorses compliance with medication -Current treatment: Losartan 50 mg daily - Appropriate, Effective, Safe, Accessible Metoprolol tartrate 25 mg -  1/2 tab BID - Appropriate, Effective, Safe, Accessible -Educated on BP goals and benefits of medications for prevention of heart attack, stroke and kidney damage; Daily salt intake goal < 2300 mg; Importance of home blood pressure monitoring; -Counseled to monitor BP at home periodically -Recommended to continue current medication  Hyperlipidemia: (LDL goal < 130) -Controlled without medication - given age and lack of ASCVD, no statin is indicated -Current treatment: None -Educated on Cholesterol goals;  -Counseled on diet and exercise extensively  Osteopenia (Goal prevent fractures) -Controlled - pt endorses compliance with alendronate -Hx breast cancer Fall 2021; was on exemestane for a few months -Last DEXA Scan: 10/31/20   T-Score total hip: -2.3  T-Score lumbar spine: -1.5  10-year probability of major osteoporotic fracture: 24.4%  10-year probability of hip fracture: 7.7% -Patient is a candidate for pharmacologic treatment due to T-Score -1.0 to -2.5 and 10-year risk of major osteoporotic fracture > 20% and T-Score -1.0 to -2.5 and 10-year risk of hip fracture > 3% -Current treatment  Alendronate 70 mg weekly Sundays (2013-2016, 2021-) - Appropriate, Effective, Safe, Accessible Calcium 600 mg w/ Vit D 800 - Appropriate, Effective, Safe, Accessible Vitamin D 2000 IU - Appropriate, Effective, Safe, Accessible -Recommend weight-bearing and muscle strengthening exercises for building and maintaining bone density. -Recommended to continue current medication  Hypothyroidism (Goal: maintain TSH in goal range) -Controlled - TSH is at goal; pt reports taking levothyroxine half hour before breakfast -Current treatment  Levothyroxine 75 mcg daily - Appropriate, Effective, Safe, Accessible -Recommended to continue current medication  Osteoarthritis (Goal: manage pain) -Controlled - pt is s/p TKA 05/2022; she has been taking higher dose of Celebrex (200 mg BID vs QD prior to  surgery) -Current treatment  Voltaren 1% gel - Appropriate, Effective, Safe, Accessible Celecoxib 200  mg daily - Appropriate, Effective, Query Safe -Recommended to continue current medication  Health Maintenance -Vaccine gaps: Flu, Shingrix -Advised flu shot -Hx Breast cancer. Dx 07/2020 (2 masses). S/p Lumpectomy 08/2020. S/p Mastectomy 05/2021. No endocrine therapy (anastrozole, exemestane not tolerated)  Patient Goals/Self-Care Activities Patient will:  - take medications as prescribed as evidenced by patient report and record review focus on medication adherence by pill box -Discuss metoprolol with cardiologist      The patient verbalized understanding of instructions, educational materials, and care plan provided today and DECLINED offer to receive copy of patient instructions, educational materials, and care plan.  Telephone follow up appointment with pharmacy team member scheduled for: 6 months  Charlene Brooke, PharmD, [facility] Clinical Pharmacist [facility] [phone]

## 2022-08-06 NOTE — Progress Notes (Signed)
Chronic Care Management Pharmacy Note  08/06/2022 Name:  Beth Rodgers MRN:  [id number] DOB:  [birth year]  Summary: CCM F/U visit -Reviewed medications; pt affirms adherence according to med list -Pt reports feeling unsteady on her feet and believes metoprolol is the cause; she plans to discuss with cardiologist at appt this month  Recommendations/Changes made from today's visit: -No med changes; encouraged to discuss metoprolol with cardiologist  Plan: -Pharmacist follow up televisit scheduled for 6 months -PCP F/U 12/04/22    Subjective: Beth Rodgers is an 87 y.o. year old female who is a primary patient of Ria Bush, MD.  The CCM team was consulted for assistance with disease management and care coordination needs.    Engaged with patient by telephone for follow up visit in response to provider referral for pharmacy case management and/or care coordination services.   Consent to Services:  The patient was given information about Chronic Care Management services, agreed to services, and gave verbal consent prior to initiation of services.  Please see initial visit note for detailed documentation.   Patient Care Team: Ria Bush, MD as PCP - General (Family Medicine) Rico Junker, RN as Registered Nurse Charlton Haws, [facility] as Pharmacist (Pharmacist)   Recent office visits: 06/24/22 Dr Danise Mina OV: annual - no med changes. D/C Eliquis (per cardiology) 06/01/22 Dr Danise Mina OV: Afib, constipation. Referred to cardiology. D/c oxycodone, refilled tramadol. Start miralax, senokot. Referred to cardiology. RTC few weeks to recheck TSH.  04/22/22 Dr Lorelei Pont OV: knee pain. Encouraged moving forward with TKA.  02/06/22 Dr Darnell Level OV: pre-op exam L TKA. Pt decided to postpone surgery. 12/17/21 NP Matt Charmian Muff OV: wound check; d/c tramadol (not taking); 10/27/21-PCP-Javier Gutierrez,MD-Patient presented for follow up upper respiratory infection.She's been taking cefdinir  300mg  once daily - discussed correct dosing is BID - Started benzonatate 100mg  take 3 times daily for cough. 10/16/21-Family Medicine-Spencer Copland,MD-Patient presented for left knee injection for pain-Referral for orthopedic surgery (interested in TKA)  Recent consult visits: 06/11/22 Left TKA. 06/03/22 Dr Rockey Situ (cardiology): new onset Afib (postop TKA). Hold Eliquis due to significant brusiing. Stay on metoprlol 12.5 mg BID. Sent Zio monitor.  05/19/22 PA Cassell Smiles (ortho surg): preop exam. TKA will be 05/27/22. 05/07/22 NP Eulogio Ditch (vasc surg): lymphedema 04/15/22 Dr [name] Banks (oncology): breast cancer surveillance 04/07/22 PA Cassell Smiles (ortho surg): knee OA. Will move forward with TKA. 01/12/22 Dr Waldron Labs (vasc surg): CVI. No med changes. 01/06/22 Dr Windell Moment (Gen Surgery): lymphedema is not likely cause of symptoms, venous insufficiency is main cause. 12/28/21 Urgent Care - Covid19;  rx'd Paxlovid. 12/26/21 NP Eulogio Ditch (Vasc surger): lymphedema. Referred to OT. 12/15/21 dr Delana Meyer (Vasc surgery): venous ulcer of ankle -  Rx'd Keflex, tramadol 50 mg. 12/15/21 Dr Windell Moment (Gen Surgery) - chest swelling not related to leg swelling. F/u with vascular. 11/18/21-Oncology-Archana Rao,MD-F/u breast cancer. Advised mammogram, follow up 4 months. Cancer hx: Dx 07/2020 (2 masses). S/p Lumpectomy 08/2020. S/p Mastectomy 05/2021. 11/06/21-Benjamin Smith,PA (Ortho) Patient presented for left knee pain.Xrays,referred for knee surgery w/ Dr [name], no medication changes  10/21/21-General Surgery-Edgardo Diaz,MD-Patient presented to discuss surgery. No medication changes 09/19/21-Orthopedics-John Hewitt,MD-Patient presented for left heel pain- no data found 09/15/21-Dermatology-Tara Stewart,MD-Patient presented for follow up rash.Start Tacrolimus ointment-apply to rash twice daily,may use desonide for few days. Vanicream for around eyes and face wash.  08/26/21-Dermatology-Tara Stewart,MD-Patient presented for  follow up psoriasis.Start Ciclopirox cream to AA QD Start tacrolimus 0.1% ointment clobetasol cream 0.05 % 08/20/21-Vascular Surgery-Fallon Brown,NP-Patient presented for  evaluation of circulation in bilateral lower legs. Pain unrelated to vascular causes, may be neuropathy. Referred to neurology for evaluation.  Hospital visits: 05/27/22 - 05/30/22 Admission [facility]): TKA, complicated by new onset Afib post-op. -started Eliquis 5 mg BID due to Afib -Started metoprolol tartrate 25 mg BID -Reduced losartan to 50 mg    Objective:  Lab Results  Component Value Date   CREATININE 0.68 06/19/2022   BUN 20 06/19/2022   GFR 79.59 06/19/2022   GFRNONAA >60 05/15/2022   GFRAA >60 08/21/2020   NA 138 06/19/2022   K 4.6 06/19/2022   CALCIUM 9.2 06/19/2022   CO2 27 06/19/2022   GLUCOSE 84 06/19/2022    Lab Results  Component Value Date/Time   HGBA1C 5.4 06/19/2022 09:12 AM   GFR 79.59 06/19/2022 09:12 AM   GFR 76.97 06/02/2021 12:27 PM    Last diabetic Eye exam: No results found for: "HMDIABEYEEXA"  Last diabetic Foot exam: No results found for: "HMDIABFOOTEX"   Lab Results  Component Value Date   CHOL 193 02/16/2020   HDL 77.30 02/16/2020   LDLCALC 104 (H) 02/16/2020   LDLDIRECT 124.9 08/31/2013   TRIG 59.0 02/16/2020   CHOLHDL 2 02/16/2020       Latest Ref Rng & Units 06/19/2022    9:12 AM 05/15/2022   12:31 PM 08/21/2020    4:25 PM  Hepatic Function  Total Protein 6.0 - 8.3 g/dL 6.5  7.2  7.4   Albumin 3.5 - 5.2 g/dL 3.9  4.0  4.3   AST 0 - 37 U/L $Remo'17  19  19   'yfzou$ ALT 0 - 35 U/L $Remo'15  16  16   'mvFAA$ Alk Phosphatase 39 - 117 U/L 131  81  82   Total Bilirubin 0.2 - 1.2 mg/dL 0.6  0.5  0.4     Lab Results  Component Value Date/Time   TSH 4.68 06/19/2022 09:12 AM   TSH 2.44 06/02/2021 12:27 PM   FREET4 1.07 02/16/2020 11:28 AM       Latest Ref Rng & Units 06/19/2022    9:12 AM 05/15/2022   12:31 PM 06/02/2021   12:27 PM  CBC  WBC 4.0 - 10.5 K/uL 5.9  5.5  6.3   Hemoglobin 12.0 -  15.0 g/dL 12.2  13.6  13.9   Hematocrit 36.0 - 46.0 % 37.3  42.4  40.9   Platelets 150.0 - 400.0 K/uL 342.0  226  230.0     Lab Results  Component Value Date/Time   VD25OH 39.84 06/19/2022 09:12 AM   VD25OH 50.58 02/16/2020 11:28 AM    Clinical ASCVD: No  The ASCVD Risk score (Arnett DK, et al., 2019) failed to calculate for the following reasons:   The 2019 ASCVD risk score is only valid for ages 31 to 45       06/22/2022   10:42 AM 06/19/2021   10:45 AM 05/31/2020    2:56 PM  Depression screen PHQ 2/9  Decreased Interest 0 0 0  Down, Depressed, Hopeless 0 0 0  PHQ - 2 Score 0 0 0  Altered sleeping  0 0  Tired, decreased energy  0 0  Change in appetite  0 0  Feeling bad or failure about yourself   0 0  Trouble concentrating  0 0  Moving slowly or fidgety/restless  0 0  Suicidal thoughts  0 0  PHQ-9 Score  0 0  Difficult doing work/chores  Not difficult at all Not difficult at all  Social History   Tobacco Use  Smoking Status Never  Smokeless Tobacco Never   BP Readings from Last 3 Encounters:  08/06/22 (!) 154/80  07/22/22 120/72  06/24/22 128/76   Pulse Readings from Last 3 Encounters:  08/06/22 69  07/22/22 71  06/24/22 65   Wt Readings from Last 3 Encounters:  08/06/22 173 lb 6.4 oz (78.7 kg)  07/22/22 172 lb 6 oz (78.2 kg)  06/24/22 172 lb 2 oz (78.1 kg)   BMI Readings from Last 3 Encounters:  08/06/22 30.23 kg/m  07/22/22 30.06 kg/m  06/24/22 30.01 kg/m    Assessment/Interventions: Review of patient past medical history, allergies, medications, health status, including review of consultants reports, laboratory and other test data, was performed as part of comprehensive evaluation and provision of chronic care management services.   SDOH:  (Social Determinants of Health) assessments and interventions performed: No - done 05/2022  SDOH Screenings   Alcohol Screen: Low Risk  (06/22/2022)   Alcohol Screen    Last Alcohol Screening Score (AUDIT):  0  Depression (PHQ2-9): Low Risk  (06/22/2022)   Depression (PHQ2-9)    PHQ-2 Score: 0  Financial Resource Strain: Low Risk  (06/22/2022)   Overall Financial Resource Strain (CARDIA)    Difficulty of Paying Living Expenses: Not hard at all  Food Insecurity: No Food Insecurity (06/22/2022)   Hunger Vital Sign    Worried About Running Out of Food in the Last Year: Never true    Ran Out of Food in the Last Year: Never true  Housing: Low Risk  (06/22/2022)   Housing    Last Housing Risk Score: 0  Physical Activity: Sufficiently Active (06/22/2022)   Exercise Vital Sign    Days of Exercise per Week: 3 days    Minutes of Exercise per Session: 60 min  Social Connections: Moderately Integrated (06/22/2022)   Social Connection and Isolation Panel [NHANES]    Frequency of Communication with Friends and Family: More than three times a week    Frequency of Social Gatherings with Friends and Family: More than three times a week    Attends Religious Services: 1 to 4 times per year    Active Member of [employer] or Organizations: No    Attends Archivist Meetings: Never    Marital Status: Married  Stress: No Stress Concern Present (06/22/2022)   [place]    Feeling of Stress : Not at all  Tobacco Use: Low Risk  (08/06/2022)   Patient History    Smoking Tobacco Use: Never    Smokeless Tobacco Use: Never    Passive Exposure: Not on file  Transportation Needs: No Transportation Needs (06/22/2022)   PRAPARE - Transportation    Lack of Transportation (Medical): No    Lack of Transportation (Non-Medical): No    CCM Care Plan  Allergies  Allergen Reactions   Anastrozole Other (See Comments), Itching and Rash    Back pain  Back pain  Back pain    Exemestane Rash    Pain in right hand  Pain in right hand  Pain in right hand    Naproxen Hives    Had bumps break out on the back  Had bumps break out on the back  Had bumps  break out on the back  Had bumps break out on the back  Had bumps break out on the back     Medications Reviewed Today     Reviewed by Lenord Fellers,  Cleaster Corin, [facility] (Pharmacist) on 08/06/22 at 1537  Med List Status: <None>   Medication Order Taking? Sig Documenting Provider Last Dose Status Informant  alendronate (FOSAMAX) 70 MG tablet [id number] Yes Take 1 tablet (70 mg total) by mouth every Sunday. Ria Bush, MD Taking Active   aspirin EC 81 MG tablet [id number] Yes Take 81 mg by mouth in the morning and at bedtime. Swallow whole. [provider] Taking Active   Calcium Carb-Cholecalciferol (CALCIUM 600 + D PO) [id number] Yes Take 1 tablet by mouth daily. Ca 500 + Vit D 800 [provider] Taking Active Self  celecoxib (CELEBREX) 200 MG capsule [id number] Yes Take 1 capsule (200 mg total) by mouth 2 (two) times daily.  Patient taking differently: Take 200 mg by mouth daily.   Fausto Skillern, PA-C Taking Active   Cholecalciferol (VITAMIN D3) 25 MCG (1000 UT) CAPS [id number] Yes Take 1 capsule (1,000 Units total) by mouth daily. Ria Bush, MD Taking Active   ciclopirox (LOPROX) 0.77 % cream [id number] Yes Apply to feet, around toenails, between toes once daily for fungus. [name], MD Taking Active   Ciclopirox 0.77 % gel [id number] Yes Apply 1 application  topically 2 (two) times daily as needed (fungus). [provider] Taking Active Self  clobetasol cream (TEMOVATE) 0.05 % [id number] Yes Apply 1 application topically as directed. Qd to bid up to 5 days a week to aa psoriasis on body until clear, then prn flares, avoid face, groin, axilla [name], MD Taking Active            Med Note Delsa Sale, [place] S   Wed Jul 22, 2022  3:27 PM)    diclofenac Sodium (VOLTAREN) 1 % GEL [id number] Yes Apply topically 3 (three) times daily as needed. [provider] Taking Active   latanoprost (XALATAN) 0.005 % ophthalmic solution [id number] Yes latanoprost  0.005 % eye drops  PLACE 1 DROP INTO BOTH EYES ONCE EVERY EVENING [provider] Taking Active   levothyroxine (SYNTHROID) 75 MCG tablet [id number] Yes Take 1 tablet (75 mcg total) by mouth daily. Ria Bush, MD Taking Active   losartan (COZAAR) 50 MG tablet [id number] Yes Take 1 tablet (50 mg total) by mouth daily. Ria Bush, MD Taking Active   Magnesium 400 MG TABS [id number] Yes Take 400 mg by mouth daily. [provider] Taking Active Self  metoprolol tartrate (LOPRESSOR) 25 MG tablet [id number] Yes Take 0.5 tablets (12.5 mg total) by mouth 2 (two) times daily. Ria Bush, MD Taking Active   Multiple Vitamin (MULTIVITAMIN) tablet [id number] Yes Take 1 tablet by mouth daily. [provider] Taking Active            Med Note Jilda Roche A   Wed Jun 11, 2021  4:39 PM)    Roflumilast (ZORYVE) 0.3 % CREA [id number] Yes Apply 1 application. topically every other day. Sample [provider] Taking Active Self  SELENIUM PO [id number] Yes Take 1 tablet by mouth daily. [provider] Taking Active Self  silver sulfADIAZINE (SILVADENE) 1 % cream [id number] Yes Apply 1 application topically daily. Schnier, Dolores Lory, MD Taking Active   tacrolimus (PROTOPIC) 0.1 % ointment [id number] Yes APPLY TOPICALLY TO AFFECTED AREA(S) TWICE DAILY [name], MD Taking Active   vitamin C (ASCORBIC ACID) 500 MG tablet [id number] Yes Take 500 mg by mouth daily. [provider] Taking Active Self  vitamin E 180 MG (400 UNITS) capsule [id number] Yes Take 400 Units by mouth daily. 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[place]) 06/01/2022   Total knee replacement status 05/27/2022   Venous ulcer of ankle, unspecified laterality ([place]) 12/24/2021   Bilateral lower extremity edema  12/17/2021   Pre-op evaluation 06/02/2021   Ascending aorta dilatation ([place]) 01/10/2021   Genetic testing 10/30/2020   Malignant neoplasm of overlapping sites of left breast in female, estrogen receptor positive ([place]) 10/22/2020   Family history of breast cancer    Family history of thyroid cancer    Breast cancer, left breast ([place]) 08/01/2020   Right carotid bruit 06/05/2020   Left Achilles tendinitis 09/01/2019   Lumbosacral spondylosis without myelopathy 08/29/2019   Varicose veins of bilateral lower extremities with pain 03/30/2018   Lymphedema 03/30/2018   Dizziness 02/02/2017   Situational depression 12/18/2016   Vitamin D deficiency 10/11/2016   Fatigue 08/12/2016   Essential hypertension 12/17/2015   Dermatitis of external ear 12/17/2015   Corn of foot 05/16/2015   Advanced care planning/counseling discussion 10/30/2014   Neck pain on right side 06/25/2014   Primary osteoarthritis of left knee 08/17/2013   Oropharyngeal dysphagia 11/15/2012   Stressful life events affecting family and household 03/30/2012   Medicare annual wellness visit, subsequent 03/30/2012   History of basal cell carcinoma    Osteoarthritis    Osteopenia    Hypothyroidism    Glaucoma    Chronic venous insufficiency     Immunization History  Administered Date(s) Administered   Fluad Quad(high Dose 65+) 09/22/2019   Hep A / Hep B 06/05/2015, 12/17/2015   Influenza Inj Mdck Quad Pf 12/26/2021   Influenza Split 11/15/2012   Influenza, High Dose Seasonal PF 11/14/2014, 10/28/2020   Influenza,inj,Quad PF,6+ Mos 09/06/2013, 12/18/2016, 10/05/2018   PFIZER(Purple Top)SARS-COV-2 Vaccination 01/28/2020, 02/20/2020, 12/09/2020   Pfizer Covid-19 Vaccine Bivalent Booster 39yrs & up 09/22/2021   Pneumococcal Conjugate-13 10/30/2014   Pneumococcal Polysaccharide-23 11/15/2012   Td 05/24/2012   Zoster Recombinat (Shingrix) 07/27/2022   Zoster, Live 07/03/2014    Conditions to be addressed/monitored:   Hypertension, Hyperlipidemia, Atrial Fibrillation, Hypothyroidism, and Osteopenia  Care Plan : [place]  Updates made by Charlton Haws, [place] since 08/06/2022 12:00 AM     Problem: Hypertension, Hyperlipidemia, Atrial Fibrillation, Hypothyroidism, and Osteopenia   Priority: High     Long-Range Goal: Disease mgmt   Start Date: 12/10/2021  Expected End Date: 06/06/2023  This Visit's Progress: On track  Recent Progress: On track  Priority: High  Note:   Current Barriers:  Possible side effects with metoprolol  Pharmacist Clinical Goal(s):  Patient will contact provider office for questions/concerns as evidenced notation of same in electronic health record through collaboration with PharmD and provider.   Interventions: 1:1 collaboration with Ria Bush, MD regarding development and update of comprehensive plan of care as evidenced by provider attestation and co-signature Inter-disciplinary care team collaboration (see longitudinal plan of care) Comprehensive medication review performed; medication list updated in electronic medical record  Atrial Fibrillation (Goal: prevent stroke and major bleeding) -Controlled - pt reports unsteadiness on her feet, she thinks due to metoprolol; she plans to discuss this with cardiologist at upcoming appt -New onset Afib 05/2022 s/p TKA; she worse Zio monitor for 30 days without recurrence of Afib -Current treatment: Metoprolol tartrate 25 mg - 1/2 tab BID -  Appropriate, Effective, Query Safe Aspirin 81 mg daily -  Appropriate, Effective, Safe, Accessible -Medications previously tried: Eliquis (hematoma) -Recommended to continue current medication; advised to discuss possible side effects at upcoming cardiology appt  Hypertension (BP goal <140/90) -Controlled - pt endorses compliance with medication -Current treatment: Losartan 50 mg daily - Appropriate, Effective, Safe, Accessible Metoprolol tartrate 25 mg - 1/2 tab BID  - Appropriate, Effective, Safe, Accessible -Educated on BP goals and benefits of medications for prevention of heart attack, stroke and kidney damage; Daily salt intake goal < 2300 mg; Importance of home blood pressure monitoring; -Counseled to monitor BP at home periodically -Recommended to continue current medication  Hyperlipidemia: (LDL goal < 130) -Controlled without medication - given age and lack of ASCVD, no statin is indicated -Current treatment: None -Educated on Cholesterol goals;  -Counseled on diet and exercise extensively  Osteopenia (Goal prevent fractures) -Controlled - pt endorses compliance with alendronate -Hx breast cancer Fall 2021; was on exemestane for a few months -Last DEXA Scan: 10/31/20   T-Score total hip: -2.3  T-Score lumbar spine: -1.5  10-year probability of major osteoporotic fracture: 24.4%  10-year probability of hip fracture: 7.7% -Patient is a candidate for pharmacologic treatment due to T-Score -1.0 to -2.5 and 10-year risk of major osteoporotic fracture > 20% and T-Score -1.0 to -2.5 and 10-year risk of hip fracture > 3% -Current treatment  Alendronate 70 mg weekly Sundays (2013-2016, 2021-) - Appropriate, Effective, Safe, Accessible Calcium 600 mg w/ Vit D 800 - Appropriate, Effective, Safe, Accessible Vitamin D 2000 IU - Appropriate, Effective, Safe, Accessible -Recommend weight-bearing and muscle strengthening exercises for building and maintaining bone density. -Recommended to continue current medication  Hypothyroidism (Goal: maintain TSH in goal range) -Controlled - TSH is at goal; pt reports taking levothyroxine half hour before breakfast -Current treatment  Levothyroxine 75 mcg daily - Appropriate, Effective, Safe, Accessible -Recommended to continue current medication  Osteoarthritis (Goal: manage pain) -Controlled - pt is s/p TKA 05/2022; she has been taking higher dose of Celebrex (200 mg BID vs QD prior to surgery) -Current  treatment  Voltaren 1% gel - Appropriate, Effective, Safe, Accessible Celecoxib 200 mg daily - Appropriate, Effective, Query Safe -Recommended to continue current medication  Health Maintenance -Vaccine gaps: Flu, Shingrix -Advised flu shot -Hx Breast cancer. Dx 07/2020 (2 masses). S/p Lumpectomy 08/2020. S/p Mastectomy 05/2021. No endocrine therapy (anastrozole, exemestane not tolerated)  Patient Goals/Self-Care Activities Patient will:  - take medications as prescribed as evidenced by patient report and record review focus on medication adherence by pill box -Discuss metoprolol with cardiologist     Medication Assistance: None required.  Patient affirms current coverage meets needs.  Compliance/Adherence/Medication fill history: Care Gaps: None  Star-Rating Drugs: Losartan - PDC 100%  Medication Access: Within the past 30 days, how often has patient missed a dose of medication? 0 Is a pillbox or other method used to improve adherence? Yes  Factors that may affect medication adherence? no barriers identified Are meds synced by current pharmacy? No  Are meds delivered by current pharmacy? No  Does patient experience delays in picking up medications due to transportation concerns? No   [facility] Services Reviewed: Is patient disadvantaged to use [facility]?: No  Current Rx insurance plan: [facility] Name and location of Current pharmacy:  [id number] - [place], [place] - [address] [place] [address] Phone: [phone] Fax: [phone]  [facility] services reviewed with patient today?: No  Patient requests  to transfer care to [facility]?: No  Reason patient declined to change pharmacies: Loyalty to other pharmacy/Patient preference   Care Plan and Follow Up Patient Decision:  Patient agrees to Care Plan and Follow-up.  Plan: Telephone follow up appointment with care management team member scheduled for:  6  months  Charlene Brooke, PharmD, BCACP Clinical Pharmacist [facility] at [facility] [phone]

## 2022-08-07 ENCOUNTER — Encounter (INDEPENDENT_AMBULATORY_CARE_PROVIDER_SITE_OTHER): Payer: Self-pay | Admitting: Vascular Surgery

## 2022-08-07 DIAGNOSIS — M1990 Unspecified osteoarthritis, unspecified site: Secondary | ICD-10-CM | POA: Insufficient documentation

## 2022-08-07 NOTE — Progress Notes (Signed)
MRN : [id number]  Beth Rodgers is a 87 y.o. (1935-04-07) female who presents with chief complaint of legs swell.  History of Present Illness:  The patient is seen for evaluation of painful lower extremities. Patient notes the pain is variable and not usually associated with activity.  The pain seems to occur late at night or early morning. She notes it's like a painful numbness and was worried it could be vascular.  The patient has a  history of  DJD and is s/p TKR by Dr [name].  No documented LS spine disease.  The patient denies rest pain or dangling of an extremity off the side of the bed during the night for relief. No open wounds or sores at this time. No history of DVT or phlebitis. No prior vascular interventions or surgeries.   ABI's done 08/20/2021 are normal bilaterally.  Current Meds  Medication Sig   alendronate (FOSAMAX) 70 MG tablet Take 1 tablet (70 mg total) by mouth every Sunday.   aspirin EC 81 MG tablet Take 81 mg by mouth in the morning and at bedtime. Swallow whole.   Calcium Carb-Cholecalciferol (CALCIUM 600 + D PO) Take 1 tablet by mouth daily. Ca 500 + Vit D 800   celecoxib (CELEBREX) 200 MG capsule Take 1 capsule (200 mg total) by mouth 2 (two) times daily. (Patient taking differently: Take 200 mg by mouth daily.)   Cholecalciferol (VITAMIN D3) 25 MCG (1000 UT) CAPS Take 1 capsule (1,000 Units total) by mouth daily.   ciclopirox (LOPROX) 0.77 % cream Apply to feet, around toenails, between toes once daily for fungus.   Ciclopirox 0.77 % gel Apply 1 application  topically 2 (two) times daily as needed (fungus).   clobetasol cream (TEMOVATE) 9.32 % Apply 1 application topically as directed. Qd to bid up to 5 days a week to aa psoriasis on body until clear, then prn flares, avoid face, groin, axilla   diclofenac Sodium (VOLTAREN) 1 % GEL Apply topically 3 (three) times daily as needed.   latanoprost (XALATAN) 0.005 % ophthalmic solution latanoprost 0.005 % eye  drops  PLACE 1 DROP INTO BOTH EYES ONCE EVERY EVENING   levothyroxine (SYNTHROID) 75 MCG tablet Take 1 tablet (75 mcg total) by mouth daily.   losartan (COZAAR) 50 MG tablet Take 1 tablet (50 mg total) by mouth daily.   Magnesium 400 MG TABS Take 400 mg by mouth daily.   metoprolol tartrate (LOPRESSOR) 25 MG tablet Take 0.5 tablets (12.5 mg total) by mouth 2 (two) times daily.   Multiple Vitamin (MULTIVITAMIN) tablet Take 1 tablet by mouth daily.   Roflumilast (ZORYVE) 0.3 % CREA Apply 1 application. topically every other day. Sample   SELENIUM PO Take 1 tablet by mouth daily.   silver sulfADIAZINE (SILVADENE) 1 % cream Apply 1 application topically daily.   tacrolimus (PROTOPIC) 0.1 % ointment APPLY TOPICALLY TO AFFECTED AREA(S) TWICE DAILY   vitamin C (ASCORBIC ACID) 500 MG tablet Take 500 mg by mouth daily.   vitamin E 180 MG (400 UNITS) capsule Take 400 Units by mouth daily.    Past Medical History:  Diagnosis Date   Basal cell carcinoma (BCC)    txted with MOHs in past   Cellulitis 08/10/2018   Chronic venous insufficiency    with varicose veins   Family history of breast cancer    Family history of thyroid cancer    GERD (gastroesophageal reflux disease)    Glaucoma  History  of basal cell carcinoma    left nare/BREAST CANCER   History of chicken pox    Hypertension    Hypoglycemia    Hypothyroid    Lymphedema    LE   Osteoarthritis    back pain, L knee pain   Osteopenia 06/2009   DEXA 11/2015: T -2.3 hip, -2.2 spine, on longterm bisphosphonate/evista   Psoriasis    Pyogenic granuloma 04/16/2016    Past Surgical History:  Procedure Laterality Date   BASAL CELL CARCINOMA EXCISION     located on face   BREAST BIOPSY Right    core done ing [place]?   BREAST BIOPSY Left 08/16/2020   3 area bx 4:00 X, IMC, 6:00Q IMC, LN hydro #3-benign   BREAST LUMPECTOMY Left 09/11/2020   two areas of [facility]   CATARACT EXTRACTION Bilateral    bilateral   DEXA  06/2009   T score  Spine: -1.8, hip -2.0   EXCISION OF BREAST BIOPSY Left 09/11/2020   Procedure: EXCISION OF BREAST BIOPSY;  Surgeon: [name], MD;  Location: [facility] ORS;  Service: General;  Laterality: Left;   Foam sclerotherapy Bilateral 09/2015   Reed [place], [place]   KNEE ARTHROPLASTY Left 05/27/2022   Procedure: COMPUTER ASSISTED TOTAL KNEE ARTHROPLASTY;  Surgeon: Dereck Leep, MD;  Location: [facility] OR[facility];  Service: Orthopedics;  Laterality: Left;   MASTECTOMY W/ SENTINEL NODE BIOPSY Left 06/25/2021   Procedure: MASTECTOMY WITH SENTINEL LYMPH NODE BIOPSY;  Surgeon: [name], MD;  Location: [facility] OR[facility];  Service: General;  Laterality: Left;   NASAL RECONSTRUCTION  2005   for [place] L nare s/p Mohs   nuclear stress test  2014   normal in [place] NODE BX Left 09/11/2020   Procedure: PARTIAL MASTECTOMY WITH NEEDLE LOCALIZATION AND AXILLARY SENTINEL LYMPH NODE [place];  Surgeon: [name], MD;  Location: [facility];  Service: General;  Laterality: Left;   RADIOFREQUENCY ABLATION Left 01/2012   remnant L G saphenous vein below knee   RADIOFREQUENCY ABLATION Right 02/2013   RLE vein ablation    VEIN LIGATION AND STRIPPING  remote   bilateral G saphenous veins    Social History Social History   Tobacco Use   Smoking status: Never   Smokeless tobacco: Never  Vaping Use   Vaping Use: Never used  Substance Use Topics   Alcohol use: Yes    Comment: Occasionally drinks wine   Drug use: No    Family History Family History  Problem Relation Age of Onset   Cancer Mother        thyroid cancer   Heart disease Father        CHF   Diabetes Father    Glaucoma Father    Arthritis Father    Coronary artery disease Maternal Uncle    Bipolar disorder Son    Heart disease Sister        CHF   Stroke Sister    Brain cancer Grandson        astrocytoma   Breast cancer Cousin        dx 53s    Allergies   Allergen Reactions   Anastrozole Other (See Comments), Itching and Rash    Back pain  Back pain  Back pain    Exemestane Rash    Pain in right hand  Pain in right hand  Pain in right hand    Naproxen Hives  Had bumps break out on the back  Had bumps break out on the back  Had bumps break out on the back  Had bumps break out on the back  Had bumps break out on the back      REVIEW OF SYSTEMS (Negative unless checked)  Constitutional: '[]'$ Weight loss  '[]'$ Fever  '[]'$ Chills Cardiac: '[]'$ Chest pain   '[]'$ Chest pressure   '[]'$ Palpitations   '[]'$ Shortness of breath when laying flat   '[]'$ Shortness of breath with exertion. Vascular:  '[]'$ Pain in legs with walking   '[x]'$ Pain in legs with standing  '[]'$ History of DVT   '[]'$ Phlebitis   '[x]'$ Swelling in legs   '[]'$ Varicose veins   '[]'$ Non-healing ulcers Pulmonary:   '[]'$ Uses home oxygen   '[]'$ Productive cough   '[]'$ Hemoptysis   '[]'$ Wheeze  '[]'$ COPD   '[]'$ Asthma Neurologic:  '[]'$ Dizziness   '[]'$ Seizures   '[]'$ History of stroke   '[]'$ History of TIA  '[]'$ Aphasia   '[]'$ Vissual changes   '[]'$ Weakness or numbness in arm   '[]'$ Weakness or numbness in leg Musculoskeletal:   '[]'$ Joint swelling   '[]'$ Joint pain   '[]'$ Low back pain Hematologic:  '[]'$ Easy bruising  '[]'$ Easy bleeding   '[]'$ Hypercoagulable state   '[]'$ Anemic Gastrointestinal:  '[]'$ Diarrhea   '[]'$ Vomiting  '[]'$ Gastroesophageal reflux/heartburn   '[]'$ Difficulty swallowing. Genitourinary:  '[]'$ Chronic kidney disease   '[]'$ Difficult urination  '[]'$ Frequent urination   '[]'$ Blood in urine Skin:  '[]'$ Rashes   '[]'$ Ulcers  Psychological:  '[]'$ History of anxiety   '[]'$  History of major depression.  Physical Examination  Vitals:   08/06/22 1431  BP: (!) 154/80  Pulse: 69  Resp: 16  Weight: 173 lb 6.4 oz (78.7 kg)   Body mass index is 30.23 kg/m. Gen: WD/WN, NAD Head: [place]/AT, No temporalis wasting.  Ear/Nose/Throat: Hearing grossly intact, nares w/o erythema or drainage, pinna without lesions Eyes: PER, EOMI, sclera nonicteric.  Neck: Supple, no gross masses.  No JVD.   Pulmonary:  Good air movement, no audible wheezing, no use of accessory muscles.  Cardiac: RRR, precordium not hyperdynamic. Vascular:  scattered varicosities present bilaterally.  Mild venous stasis changes to the legs bilaterally.  2-3+ soft pitting edema  Vessel Right Left  Radial Palpable Palpable  Gastrointestinal: soft, non-distended. No guarding/no peritoneal signs.  Musculoskeletal: M/S 5/5 throughout.  No deformity.  Neurologic: CN 2-12 intact. Pain and light touch intact in extremities.  Symmetrical.  Speech is fluent. Motor exam as listed above. Psychiatric: Judgment intact, Mood & affect appropriate for pt's clinical situation. Dermatologic: Venous rashes no ulcers noted.  No changes consistent with cellulitis. Lymph : No lichenification or skin changes of chronic lymphedema.  CBC Lab Results  Component Value Date   WBC 5.9 06/19/2022   HGB 12.2 06/19/2022   HCT 37.3 06/19/2022   MCV 88.8 06/19/2022   PLT 342.0 06/19/2022    BMET    Component Value Date/Time   NA 138 06/19/2022 0912   K 4.6 06/19/2022 0912   CL 102 06/19/2022 0912   CO2 27 06/19/2022 0912   GLUCOSE 84 06/19/2022 0912   BUN 20 06/19/2022 0912   CREATININE 0.68 06/19/2022 0912   CALCIUM 9.2 06/19/2022 0912   GFRNONAA >60 05/15/2022 1231   GFRAA >60 08/21/2020 1625   CrCl cannot be calculated (Patient's most recent lab result is older than the maximum 21 days allowed.).  COAG No results found for: "INR", "PROTIME"  Radiology No results found.   Assessment/Plan 1. Primary osteoarthritis involving multiple joints Recommend:  The patient has atypical pain symptoms for vascular disease and on exam  I do not find evidence of vascular pathology that would explain the patient's symptoms.  Noninvasive studies do not identify significant vascular problems  I suspect the patient is c/o pseudoclaudication.  Patient should have an evaluation of the LS spine which I defer to the primary service or the  Spine service.  The patient should continue walking and begin a more formal exercise program. The patient should continue his antiplatelet therapy and aggressive treatment of the lipid abnormalities.   2. Pain in both lower extremities See #1 - VAS [place] LOWER EXTREMITY VENOUS (DVT); Future  3. Lymphedema Recommend:  No surgery or intervention at this point in time.    I have reviewed my discussion with the patient regarding lymphedema and why it  causes symptoms.  Patient will continue wearing graduated compression on a daily basis. The patient should put the compression on first thing in the morning and removing them in the evening. The patient should not sleep in the compression.   In addition, behavioral modification throughout the day will be continued.  This will include frequent elevation (such as in a recliner), use of over the counter pain medications as needed and exercise such as walking.  The systemic causes for chronic edema such as liver, kidney and cardiac etiologies does not appear to have significant changed over the past year.    The patient will continue aggressive use of the  lymph pump.  This will continue to improve the edema control and prevent sequela such as ulcers and infections.   The patient will follow-up with me on an annual basis.   - VAS [place] LOWER EXTREMITY VENOUS (DVT); Future  4. Essential hypertension Continue antihypertensive medications as already ordered, these medications have been reviewed and there are no changes at this time.     Hortencia Pilar, MD  08/07/2022 1:34 PM

## 2022-08-11 IMAGING — MG MM BREAST LOCALIZATION CLIP
6 series · 6 of 18 positions shown · non-contrast
Comparison: Previous exam(s).

CLINICAL DATA: Post ultrasound-guided core needle biopsy 2 left
breast masses and a left axillary lymph node.

EXAM:
DIAGNOSTIC LEFT MAMMOGRAM POST ULTRASOUND BIOPSY

[L CC synth-2D]
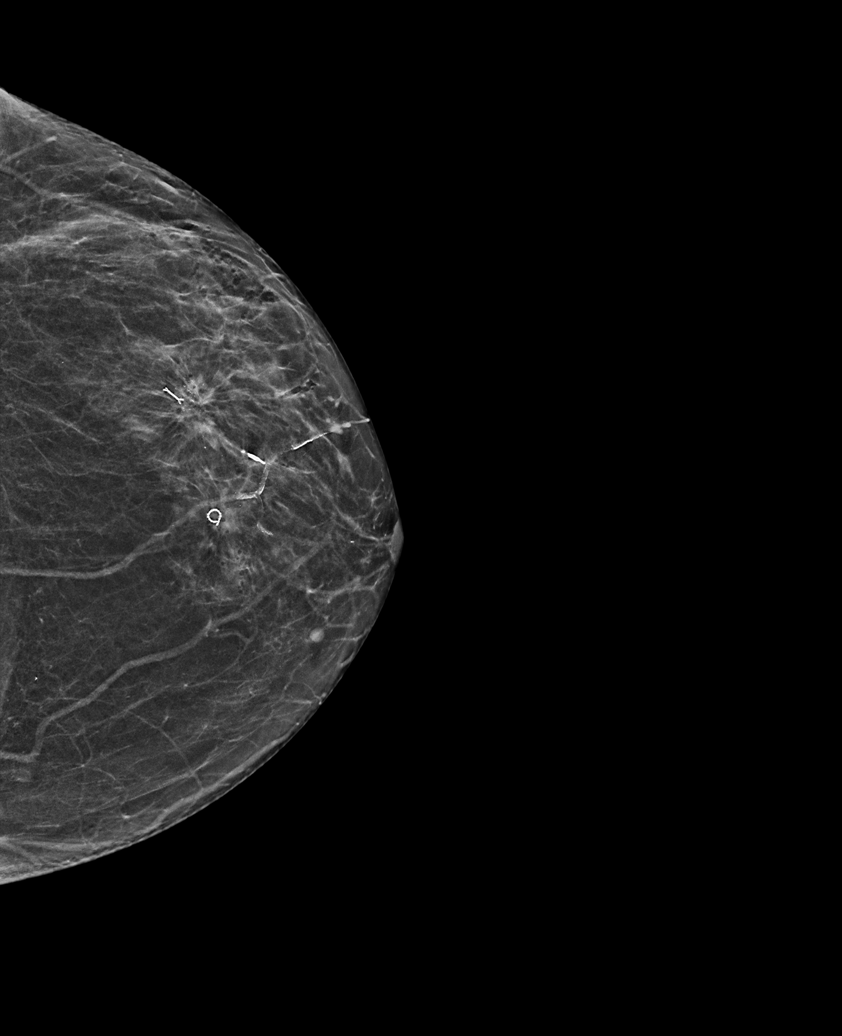

[L ML synth-2D (1 of 2)]
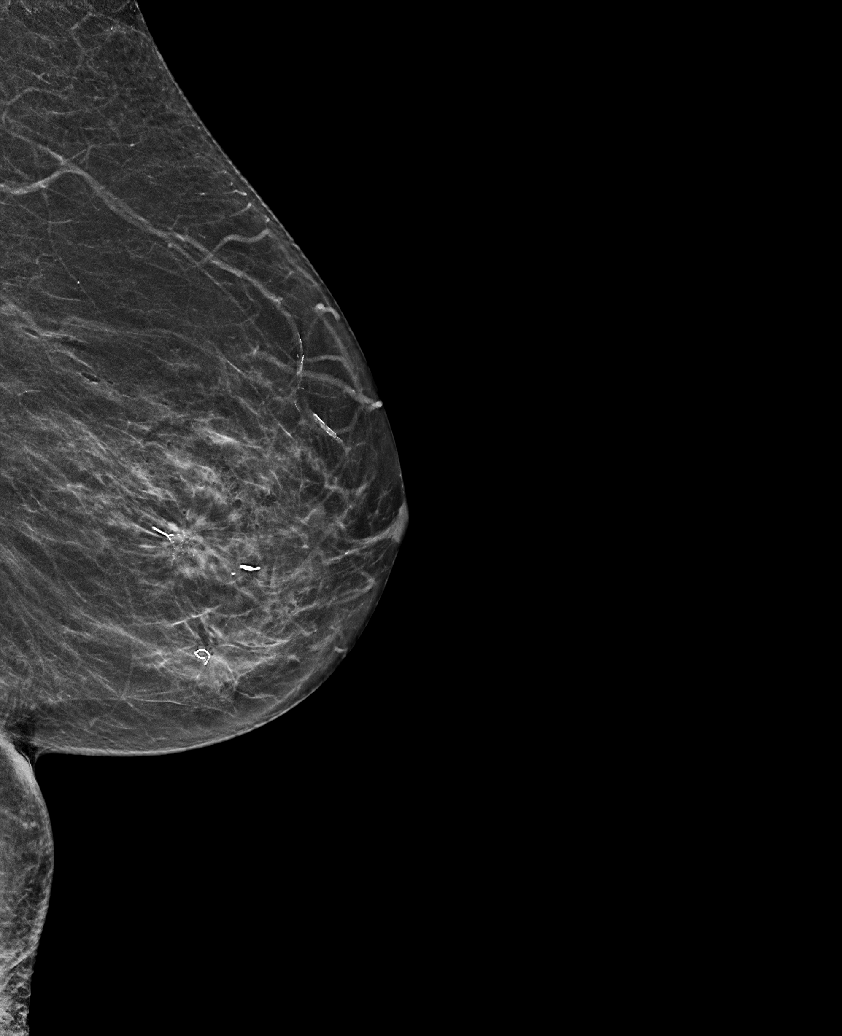

[L ML synth-2D (2 of 2)]
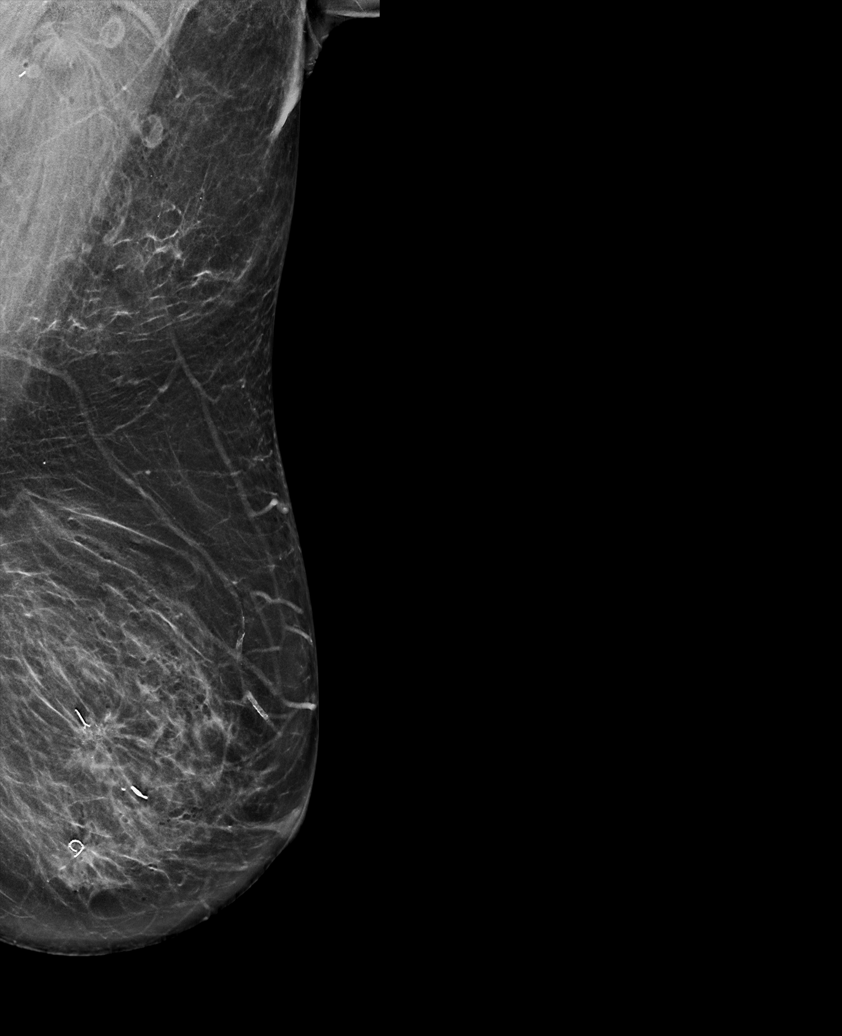

[L ML tomo (1 of 2) · tomo slice 30/59.0]
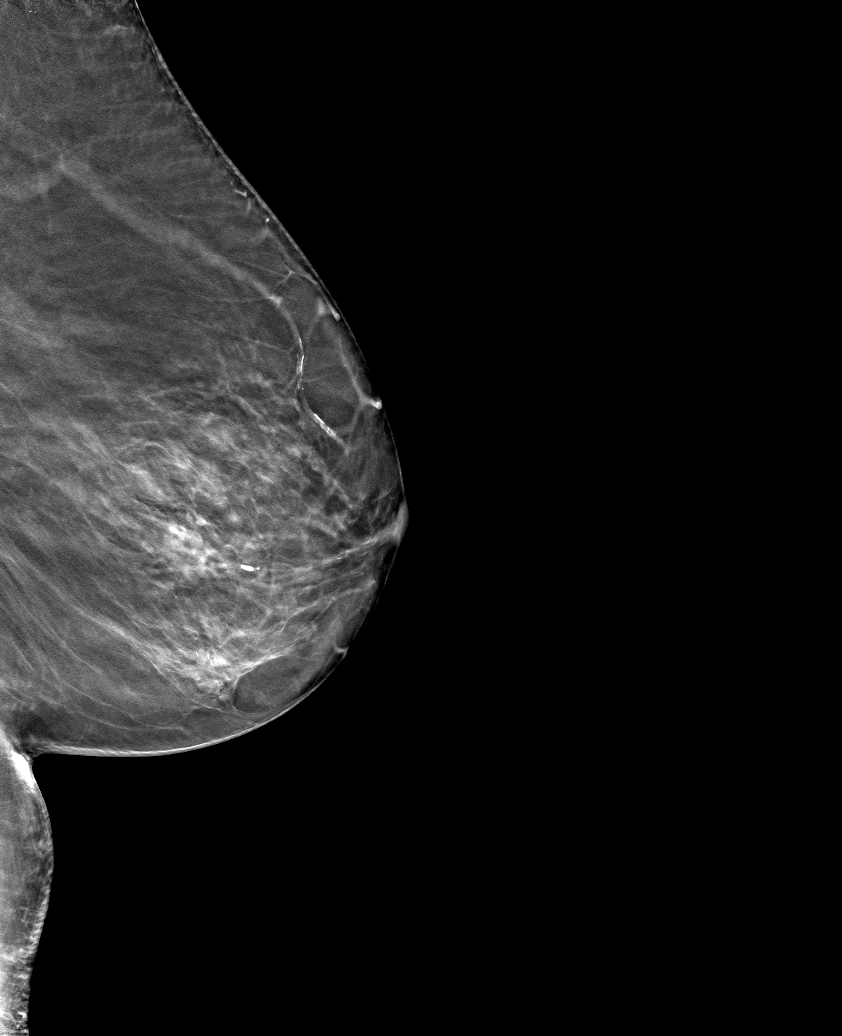

[L CC tomo · tomo slice 30/59.0]
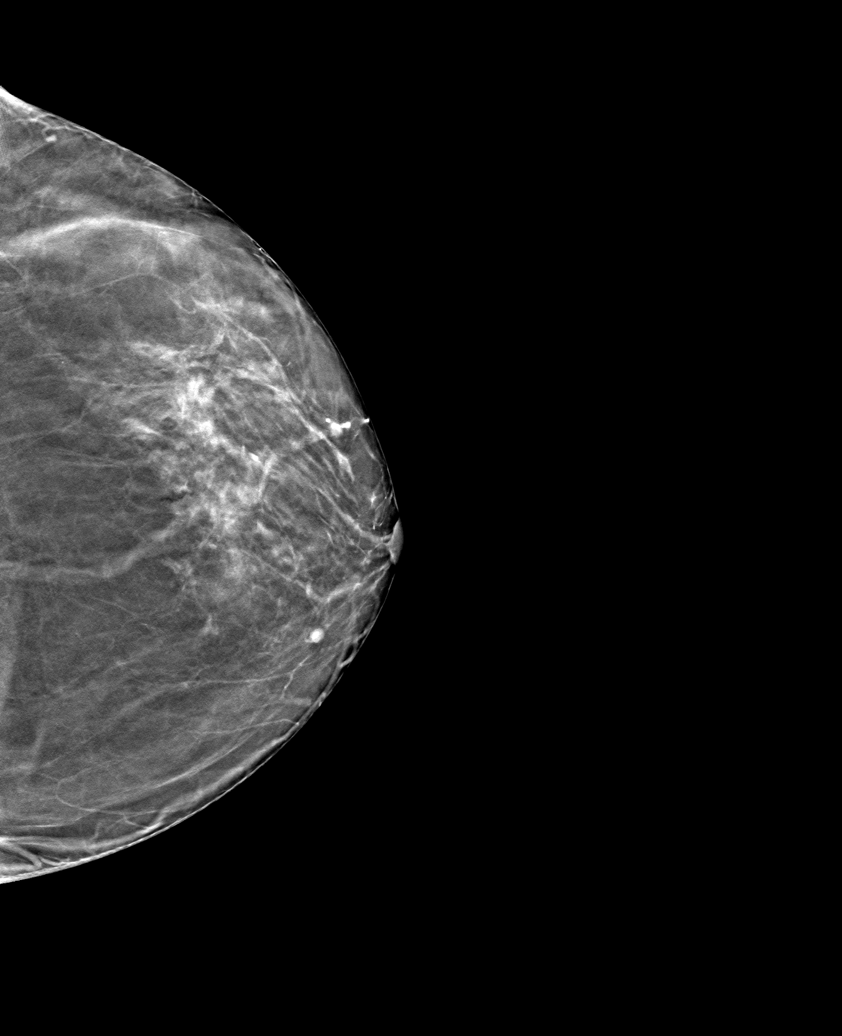

[L ML tomo (2 of 2) · tomo slice 33/64.0]
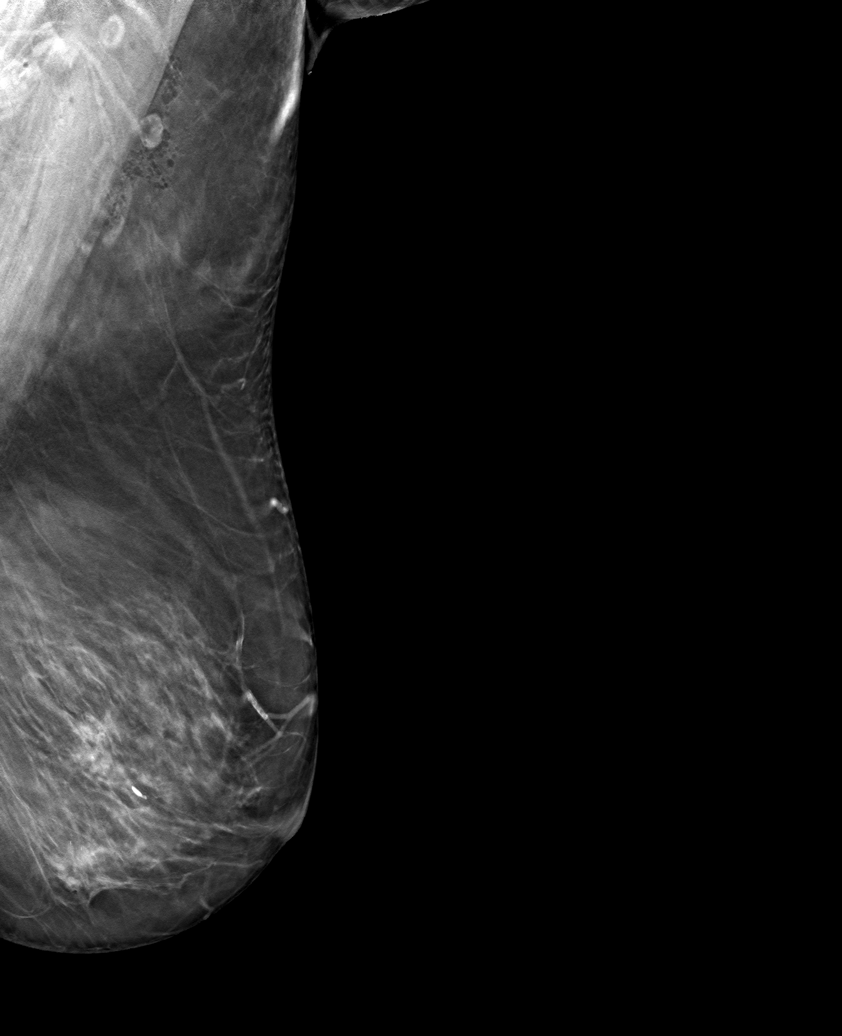

[6 of 18 positions shown; findings below may reference images not displayed]

FINDINGS: Mammographic images were obtained following ultrasound guided biopsy
of 2 left breast masses and a left axillary lymph node. The biopsy
marking clips are in expected positions at the biopsy sites.
IMPRESSION: Appropriate positioning of the X shaped biopsy marking clip at the
site of biopsy in the left breast 4 o'clock mass.

Appropriate positioning of the Q shaped biopsy marking clip at the
site of biopsy in the left breast 6 o'clock mass.

Appropriate positioning of the #3 HydroMARK shaped biopsy marking
clip at the site of biopsy in the left axilla.

Final Assessment: Post Procedure Mammograms for Marker Placement

## 2022-08-18 ENCOUNTER — Ambulatory Visit (INDEPENDENT_AMBULATORY_CARE_PROVIDER_SITE_OTHER): Payer: Medicare Other | Admitting: Psychology

## 2022-08-18 DIAGNOSIS — F4323 Adjustment disorder with mixed anxiety and depressed mood: Secondary | ICD-10-CM

## 2022-08-18 NOTE — Progress Notes (Signed)
[facility] Counselor/Therapist Progress Note  Patient ID: Beth Rodgers, MRN: [id number]    Date: 08/18/22  Time Spent: 1:02 pm - 1:56 pm : 54 Minutes  Treatment Type: Individual Therapy.  Reported Symptoms: Rumination, anxiety.    Mental Status Exam: Appearance:  NA     Behavior: Appropriate  Motor: NA  Speech/Language:  Clear and Coherent and Normal Rate  Affect: Congruent  Mood: dysthymic  Thought process: normal  Thought content:   WNL  Sensory/Perceptual disturbances:   WNL  Orientation: oriented to person, place, time/date, and situation  Attention: Good  Concentration: Good  Memory: WNL  Fund of knowledge:  Good  Insight:   Good  Judgment:  Good  Impulse Control: Good   Risk Assessment: Danger to Self:  No Self-injurious Behavior: No  Danger to Others: No Duty to Warn:no Physical Aggression / Violence:No  Access to Firearms a concern: No  Gang Involvement:No   Subjective:   Beth Rodgers participated from home, via phone, and consented to treatment. Therapist participated from home office. We met online due to [place] pandemic. Beth Rodgers reviewed the events of the past two weeks. She noted working on engaging in enjoyable and intellectually challenging activities. She noted her son's status worsening in case of functioning and overall health. She noted her worry that her son is not getting what he needs consistently. She noted increased sleep and noted this possibly being in relation to the stress related to [place]. She noted difficulty diffentiating between feeling tired vs. Avoidance. We explored this during the session and ways to differentiate between. Therapist encouraged Beth Rodgers to create a schedule for the day, engage in enjoyable activities, track her sleep, and attempt to delay naps with said activities to see if this affects her perceived need for sleep. Beth Rodgers was engaged and noted her intent to create a "skeleton" routine for the day and adding enjoyable  activities using her "free time". Therapist employed BA principles to identify ways to set a schedule, identify a barriers, prepare, and problem-solve. Therapist provided examples employing these tenets of BA. Beth Rodgers was engaged and motivated during the session. She expressed commitment towards our goals specifically setting a schedule. Therapist praised [name] and provided supportive therapy.     Interventions: Interpersonal & BA  Diagnosis: Adjustment disorder with mixed anxiety and depressed mood  Plan: Patient is to use CBT, BA, Problem-solving, mindfulness and coping skills to help manage decrease symptoms associated with their diagnosis. (Target date: 09/16/22)   Long-term goal:   Reduce overall level, frequency, and intensity of the feelings of depression and anxiety evidenced by   decreased sadness, rumination, lethargy, and interpersonal stressors  from 6 to 7 days/week to 0 to 1 days/week per client report for at least 3 consecutive months.  Short-term goal:  Verbally express understanding of the relationship between feelings of depression, anxiety and their impact on thinking patterns and behaviors.  Verbalize an understanding of the role that distorted thinking plays in creating fears, excessive worry, and ruminations.  Engage in enjoyable activities on a consistent basis.  [name], LCSW

## 2022-08-20 ENCOUNTER — Ambulatory Visit (INDEPENDENT_AMBULATORY_CARE_PROVIDER_SITE_OTHER): Payer: Medicare Other | Admitting: Vascular Surgery

## 2022-08-25 NOTE — Progress Notes (Unsigned)
Cardiology Office Note  Date:  08/26/2022   ID:  Kenzleigh, Sedam 05/12/1935, MRN [id number]  PCP:  Ria Bush, MD   Chief Complaint  Patient presents with   2 month follow up     Discuss Zio monitor. Patient c/o Metoprolol causing muscular pain & dizziness. Medications reviewed by the patient verbally.     HPI:  Ms. Brier Firebaugh is a pleasant 87 year old woman with past medical history of Hypertension Deconditioning Hypothyroidism Gait instability Who presents for follow-up of her shortness of breath and chest discomfort, atrial fibrillation postoperatively June 2023  Last seen in clinic by myself June 2023 In follow-up today she reports doing well She is concerned about side effects from her metoprolol, feels this is causing her right shoulder and right arm pain Has been seen by Dr. Lorelei Pont for similar symptoms She reports arm and shoulder are tender to touch Wonders if she should go on higher dose losartan and stop the metoprolol  Reports her left knee and leg has healed, previously noted hematoma ecchymotic bruising post surgery has resolved Eliquis held at that time on last office visit given large ecchymotic bruising  Zio monitor reviewed showing: No significant atrial fibrillation Rare very short episodes of tachycardia, longest was 13 beats On average having 3-4 very short episodes per day, nothing significant No triggered events  EKG personally reviewed by myself on todays visit Normal sinus rhythm rate 73 bpm no significant ST-T wave changes  Other past medical history reviewed hospital admission beginning of June 2023 for left total knee arthroplasty sustained tachycardia POD#2.  EKG showed A-fib with RVR,  ECHO  normal EF  on Eliquis '5mg'$  twice daily.    discharged home on 05/30/22 on Losartan, Metoprolol and Eliquis.   Was in normal sinus rhythm at the time of discharge based on review of echocardiogram June 3  Echocardiogram Left ventricular ejection  fraction, by estimation, is 55 to 60%. The left ventricle has normal function. The left ventricle has no regional wall motion abnormalities. Left ventricular diastolic parameters were normal.  2. Right ventricular systolic function is normal. The right ventricular size is mildly enlarged. Tricuspid regurgitation signal is inadequate for assessing PA pressure.  3. The mitral valve is grossly normal. Trivial mitral valve regurgitation. No evidence of mitral stenosis.  4. The aortic valve is tricuspid. There is mild calcification of the aortic valve. There is mild thickening of the aortic valve. Aortic valve regurgitation is mild. Aortic valve sclerosis/calcification is present, without any evidence of aortic stenosis.  5. The inferior vena cava is dilated in size with >50% respiratory variability, suggesting right atrial pressure of 8 mmHg.  Atrial fibrillation noted 05/29/22 D/c on 05/30/2022, was in NSR, noted on echo    PMH:   has a past medical history of Basal cell carcinoma (BCC), Cellulitis (08/10/2018), Chronic venous insufficiency, Family history of breast cancer, Family history of thyroid cancer, GERD (gastroesophageal reflux disease), Glaucoma, History  of basal cell carcinoma, History of chicken pox, Hypertension, Hypoglycemia, Hypothyroid, Lymphedema, Osteoarthritis, Osteopenia (06/2009), Psoriasis, and Pyogenic granuloma (04/16/2016).  PSH:    Past Surgical History:  Procedure Laterality Date   BASAL CELL CARCINOMA EXCISION     located on face   BREAST BIOPSY Right    core done ing [place]?   BREAST BIOPSY Left 08/16/2020   3 area bx 4:00 X, IMC, 6:00Q IMC, LN hydro #3-benign   BREAST LUMPECTOMY Left 09/11/2020   two areas of [facility]   CATARACT EXTRACTION Bilateral  bilateral   DEXA  06/2009   T score Spine: -1.8, hip -2.0   EXCISION OF BREAST BIOPSY Left 09/11/2020   Procedure: EXCISION OF BREAST BIOPSY;  Surgeon: [name], MD;  Location: [facility] ORS;  Service: General;   Laterality: Left;   Foam sclerotherapy Bilateral 09/2015   [place]ed [place], [place]   KNEE ARTHROPLASTY Left 05/27/2022   Procedure: COMPUTER ASSISTED TOTAL KNEE ARTHROPLASTY;  Surgeon: Dereck Leep, MD;  Location: [facility] ORS;  Service: Orthopedics;  Laterality: Left;   MASTECTOMY W/ SENTINEL NODE BIOPSY Left 06/25/2021   Procedure: MASTECTOMY WITH SENTINEL LYMPH NODE BIOPSY;  Surgeon: [name], MD;  Location: [facility] ORS;  Service: General;  Laterality: Left;   NASAL RECONSTRUCTION  2005   for [place] L nare s/p Mohs   nuclear stress test  2014   normal in [place] BX Left 09/11/2020   Procedure: PARTIAL MASTECTOMY WITH NEEDLE LOCALIZATION AND AXILLARY SENTINEL LYMPH NODE BX;  Surgeon: [name], MD;  Location: [facility] ORS;  Service: General;  Laterality: Left;   RADIOFREQUENCY ABLATION Left 01/2012   remnant L G saphenous vein below knee   RADIOFREQUENCY ABLATION Right 02/2013   RLE vein ablation    VEIN LIGATION AND STRIPPING  remote   bilateral G saphenous veins    Current Outpatient Medications  Medication Sig Dispense Refill   alendronate (FOSAMAX) 70 MG tablet Take 1 tablet (70 mg total) by mouth every Sunday. 12 tablet 3   aspirin EC 81 MG tablet Take 81 mg by mouth in the morning and at bedtime. Swallow whole.     Calcium Carb-Cholecalciferol (CALCIUM 600 + D PO) Take 1 tablet by mouth daily. Ca 500 + Vit D 800     celecoxib (CELEBREX) 200 MG capsule Take 1 capsule (200 mg total) by mouth 2 (two) times daily. (Patient taking differently: Take 200 mg by mouth daily.) 90 capsule 0   Cholecalciferol (VITAMIN D3) 25 MCG (1000 UT) CAPS Take 1 capsule (1,000 Units total) by mouth daily. 30 capsule    ciclopirox (LOPROX) 0.77 % cream Apply to feet, around toenails, between toes once daily for fungus. 90 g 2   Ciclopirox 0.77 % gel Apply 1 application  topically 2 (two) times daily as needed  (fungus).     clobetasol cream (TEMOVATE) 6.44 % Apply 1 application topically as directed. Qd to bid up to 5 days a week to aa psoriasis on body until clear, then prn flares, avoid face, groin, axilla 60 g 2   diclofenac Sodium (VOLTAREN) 1 % GEL Apply topically 3 (three) times daily as needed.     latanoprost (XALATAN) 0.005 % ophthalmic solution latanoprost 0.005 % eye drops  PLACE 1 DROP INTO BOTH EYES ONCE EVERY EVENING     levothyroxine (SYNTHROID) 75 MCG tablet Take 1 tablet (75 mcg total) by mouth daily. 90 tablet 3   losartan (COZAAR) 50 MG tablet Take 1 tablet (50 mg total) by mouth daily. 90 tablet 3   Magnesium 400 MG TABS Take 400 mg by mouth daily.     metoprolol tartrate (LOPRESSOR) 25 MG tablet Take 0.5 tablets (12.5 mg total) by mouth 2 (two) times daily. 90 tablet 3   Multiple Vitamin (MULTIVITAMIN) tablet Take 1 tablet by mouth daily.     Roflumilast (ZORYVE) 0.3 % CREA Apply 1 application. topically every other day. Sample     SELENIUM PO Take 1 tablet by mouth daily.  silver sulfADIAZINE (SILVADENE) 1 % cream Apply 1 application topically daily. 50 g 4   tacrolimus (PROTOPIC) 0.1 % ointment APPLY TOPICALLY TO AFFECTED AREA(S) TWICE DAILY 30 g 0   vitamin C (ASCORBIC ACID) 500 MG tablet Take 500 mg by mouth daily.     vitamin E 180 MG (400 UNITS) capsule Take 400 Units by mouth daily.     No current facility-administered medications for this visit.    Allergies:   Anastrozole, Exemestane, and Naproxen   Social History:  The patient  reports that she has never smoked. She has never used smokeless tobacco. She reports current alcohol use. She reports that she does not use drugs.   Family History:   family history includes Arthritis in her father; Bipolar disorder in her son; Brain cancer in her grandson; Breast cancer in her cousin; Cancer in her mother; Coronary artery disease in her maternal uncle; Diabetes in her father; Glaucoma in her father; Heart disease in her  father and sister; Stroke in her sister.    Review of Systems: Review of Systems  Constitutional: Negative.   HENT: Negative.    Respiratory: Negative.    Cardiovascular: Negative.   Gastrointestinal: Negative.   Musculoskeletal: Negative.        Leg swelling on left with bruising  Neurological: Negative.   Psychiatric/Behavioral: Negative.    All other systems reviewed and are negative.   PHYSICAL EXAM: VS:  BP (!) 150/82 (BP Location: Right Arm, Patient Position: Sitting, Cuff Size: Normal)   Pulse 73   Ht '5\' 6"'$  (1.676 m)   Wt 171 lb 4 oz (77.7 kg)   SpO2 98%   BMI 27.64 kg/m  , BMI Body mass index is 27.64 kg/m. GEN: Well nourished, well developed, in no acute distress HEENT: normal Neck: no JVD, carotid bruits, or masses Cardiac: RRR; no murmurs, rubs, or gallops,no edema  Respiratory:  clear to auscultation bilaterally, normal work of breathing GI: soft, nontender, nondistended, + BS MS: no deformity or atrophy, Left leg ecchymotic bruising thigh, postsurgical incision left knee Skin: warm and dry, no rash Neuro:  Strength and sensation are intact Psych: euthymic mood, full affect  Recent Labs: 06/19/2022: ALT 15; BUN 20; Creatinine, Ser 0.68; Hemoglobin 12.2; Platelets 342.0; Potassium 4.6; Sodium 138; TSH 4.68    Lipid Panel Lab Results  Component Value Date   CHOL 193 02/16/2020   HDL 77.30 02/16/2020   LDLCALC 104 (H) 02/16/2020   TRIG 59.0 02/16/2020      Wt Readings from Last 3 Encounters:  08/26/22 171 lb 4 oz (77.7 kg)  08/06/22 173 lb 6.4 oz (78.7 kg)  07/22/22 172 lb 6 oz (78.2 kg)     ASSESSMENT AND PLAN:  Problem List Items Addressed This Visit       Cardiology Problems   Chronic venous insufficiency   Essential hypertension   Other Visit Diagnoses     Atrial fibrillation with RVR (HCC)    -  Primary   Chest pain, unspecified type       Shortness of breath          Paroxysmal atrial fibrillation lone episode of atrial  fibrillation postoperatively lasting 24 hours or less, Eliquis held on prior clinic visit in the setting of significant Eliquis ecchymotic bruising left thigh.    She is concerned about side effects of the metoprolol causing right shoulder and arm pain Recommend we try alternate beta-blocker, will start bisoprolol 5 mg daily and stop metoprolol tartrate My  suspicion is that the metoprolol tartrate is not causing the musculoskeletal discomfort Zio monitor with no A-fib  She will continue to monitor her heart rate at home for recurrent episodes  Chest discomfort No recent chest pain symptoms No further work-up at this time  Shortness of breath Symptoms seem to have recovered, more active, uses a cane to ambulate  Weakness Recovered well, gait improving   Total encounter time more than 30 minutes  Greater than 50% was spent in counseling and coordination of care with the patient    Signed, Esmond Plants, M.D., Ph.D. [place], [place]

## 2022-08-26 ENCOUNTER — Encounter: Payer: Self-pay | Admitting: Cardiovascular Disease

## 2022-08-26 ENCOUNTER — Ambulatory Visit: Payer: Medicare Other | Attending: Cardiovascular Disease | Admitting: Cardiovascular Disease

## 2022-08-26 VITALS — BP 150/82 | HR 73 | Ht 66.0 in | Wt 171.2 lb

## 2022-08-26 DIAGNOSIS — I10 Essential (primary) hypertension: Secondary | ICD-10-CM | POA: Insufficient documentation

## 2022-08-26 DIAGNOSIS — I872 Venous insufficiency (chronic) (peripheral): Secondary | ICD-10-CM | POA: Insufficient documentation

## 2022-08-26 DIAGNOSIS — R079 Chest pain, unspecified: Secondary | ICD-10-CM | POA: Diagnosis not present

## 2022-08-26 DIAGNOSIS — R0602 Shortness of breath: Secondary | ICD-10-CM | POA: Diagnosis not present

## 2022-08-26 DIAGNOSIS — I4891 Unspecified atrial fibrillation: Secondary | ICD-10-CM | POA: Insufficient documentation

## 2022-08-26 MED ORDER — BISOPROLOL FUMARATE 5 MG PO TABS: 5.0000 mg | ORAL_TABLET | Freq: Every day | ORAL | 11 refills | Status: DC

## 2022-08-26 NOTE — Patient Instructions (Addendum)
Monitor blood pressure at home Call if elevated  Monitor pulse at home Range for heart rate 60 to 80  Medication Instructions:  Hold the metoprolol Start bisoprolol 5 mg daily  If you need a refill on your cardiac medications before your next appointment, please call your pharmacy.   Lab work: No new labs needed  Testing/Procedures: No new testing needed  Follow-Up: At [facility], you and your health needs are our priority.  As part of our continuing mission to provide you with exceptional heart care, we have created designated Provider Care Teams.  These Care Teams include your primary Cardiologist (physician) and Advanced Practice Providers (APPs -  Physician Assistants and Nurse Practitioners) who all work together to provide you with the care you need, when you need it.  You will need a follow up appointment in 12 months  Providers on your designated Care Team:   Murray Hodgkins, NP Christell Faith, PA-C Cadence Kathlen Mody, [place]  COVID-19 Vaccine Information can be found at: ShippingScam.co.uk For questions related to vaccine distribution or appointments, please email [email or web address] .[email or web address] or call [phone].

## 2022-08-27 DIAGNOSIS — M25469 Effusion, unspecified knee: Secondary | ICD-10-CM | POA: Diagnosis not present

## 2022-08-27 DIAGNOSIS — Z96652 Presence of left artificial knee joint: Secondary | ICD-10-CM | POA: Diagnosis not present

## 2022-08-28 ENCOUNTER — Telehealth: Payer: Self-pay | Admitting: Family Medicine

## 2022-08-28 NOTE — Telephone Encounter (Signed)
Patient called in stating that she had an appointment with the knee surgeon. She stated they are concerned about a floating mass above the knee. She stated that her prescription from cardiologist changed and now she is taking bisoprolol (ZEBETA) 5 MG tablet. Thank you!

## 2022-08-28 NOTE — Telephone Encounter (Signed)
The change has already been made in [place]. Will forward to Dr Darnell Level so he will know about her knee.

## 2022-09-01 ENCOUNTER — Ambulatory Visit (INDEPENDENT_AMBULATORY_CARE_PROVIDER_SITE_OTHER): Payer: Medicare Other | Admitting: Psychology

## 2022-09-01 DIAGNOSIS — F4323 Adjustment disorder with mixed anxiety and depressed mood: Secondary | ICD-10-CM | POA: Diagnosis not present

## 2022-09-01 NOTE — Progress Notes (Signed)
[place] Counselor/Therapist Progress Note  Patient ID: BRITTANYA WINBURN, MRN: [id number]    Date: 09/01/22  Time Spent: 1:01 pm - 1:59 pm : 65 Minutes  Treatment Type: Individual Therapy.  Reported Symptoms: Rumination, anxiety.    Mental Status Exam: Appearance:  NA     Behavior: Appropriate  Motor: NA  Speech/Language:  Clear and Coherent and Normal Rate  Affect: Congruent  Mood: dysthymic  Thought process: normal  Thought content:   WNL  Sensory/Perceptual disturbances:   WNL  Orientation: oriented to person, place, time/date, and situation  Attention: Good  Concentration: Good  Memory: WNL  Fund of knowledge:  Good  Insight:   Good  Judgment:  Good  Impulse Control: Good   Risk Assessment: Danger to Self:  No Self-injurious Behavior: No  Danger to Others: No Duty to Warn:no Physical Aggression / Violence:No  Access to Firearms a concern: No  Gang Involvement:No   Subjective:   [name] participated from home, via phone, and consented to treatment. Therapist participated from home office. We met online due to [place] pandemic. [place] reviewed the events of the past two weeks. Dorleen noted some difficulty with her knee but noted her efforts to follow-up with her provider to address concerns. She noted grieving for her grandchildren who she has not seen for years. We continued to process this during the session and discussed ways to manage this stress. She noted worry about her son and his recent scheduled court dates as she has not seen him in some time. We worked on identifying the positives including the awareness that her son is local and that his involvement in the court system could result in treatment and supervision. She noted her attempts to engage consistently and find ways to feel challenged and content. She noted her concerted effort related to managing her mood. Therapist encouraged continued self-care and boundaries regarding rumination. Therapist  praised Zoe and provided supportive therapy.     Interventions: Interpersonal & CBT  Diagnosis: Adjustment disorder with mixed anxiety and depressed mood  Plan: Patient is to use CBT, BA, Problem-solving, mindfulness and coping skills to help manage decrease symptoms associated with their diagnosis. (Target date: 09/16/22)   Long-term goal:   Reduce overall level, frequency, and intensity of the feelings of depression and anxiety evidenced by   decreased sadness, rumination, lethargy, and interpersonal stressors  from 6 to 7 days/week to 0 to 1 days/week per client report for at least 3 consecutive months.  Short-term goal:  Verbally express understanding of the relationship between feelings of depression, anxiety and their impact on thinking patterns and behaviors.  Verbalize an understanding of the role that distorted thinking plays in creating fears, excessive worry, and ruminations.  Engage in enjoyable activities on a consistent basis.  [name], LCSW

## 2022-09-01 NOTE — Telephone Encounter (Signed)
Noted! Thank you

## 2022-09-03 DIAGNOSIS — H401123 Primary open-angle glaucoma, left eye, severe stage: Secondary | ICD-10-CM | POA: Diagnosis not present

## 2022-09-07 ENCOUNTER — Ambulatory Visit (INDEPENDENT_AMBULATORY_CARE_PROVIDER_SITE_OTHER): Payer: Medicare Other | Admitting: Family Medicine

## 2022-09-07 ENCOUNTER — Encounter: Payer: Self-pay | Admitting: Family Medicine

## 2022-09-07 ENCOUNTER — Ambulatory Visit (INDEPENDENT_AMBULATORY_CARE_PROVIDER_SITE_OTHER): Payer: Medicare Other | Admitting: Vascular Surgery

## 2022-09-07 VITALS — BP 124/70 | HR 75 | Temp 97.5°F | Ht 66.0 in | Wt 173.0 lb

## 2022-09-07 DIAGNOSIS — Z96652 Presence of left artificial knee joint: Secondary | ICD-10-CM | POA: Diagnosis not present

## 2022-09-07 DIAGNOSIS — I4891 Unspecified atrial fibrillation: Secondary | ICD-10-CM

## 2022-09-07 DIAGNOSIS — R2242 Localized swelling, mass and lump, left lower limb: Secondary | ICD-10-CM | POA: Diagnosis not present

## 2022-09-07 DIAGNOSIS — R39198 Other difficulties with micturition: Secondary | ICD-10-CM | POA: Diagnosis not present

## 2022-09-07 MED ORDER — OXYBUTYNIN CHLORIDE 5 MG PO TABS: 5.0000 mg | ORAL_TABLET | Freq: Every evening | ORAL | 0 refills | Status: DC | PRN

## 2022-09-07 NOTE — Assessment & Plan Note (Signed)
Reviewed recent ortho OV as well as [place] - suspected lipoma.  Discussed lipoma, handout provided. Advised let us know if enlarging mass or new symptoms.

## 2022-09-07 NOTE — Assessment & Plan Note (Signed)
Appreciate cards care. 

## 2022-09-07 NOTE — Patient Instructions (Signed)
Monitoree el area y nos deja saber si agranda la masa.  Parece que es lipoma o masa de tejido grasoso benigno.   Lipoma Lipoma  Un lipoma es un tumor no canceroso (benigno) formado por clulas de grasa. Es un tipo muy frecuente de [employer] tejidos blandos. Por lo general, los lipomas se encuentran debajo de la piel (subcutneos). Pueden aparecer en cualquier tejido del cuerpo que contenga grasa. Las zonas en las que los lipomas aparecen con mayor frecuencia incluyen la espalda, los Amesti, los hombros, las nalgas y los muslos. Los lipomas crecen lentamente y, en general, son indoloros. La mayora de los lipomas no causan problemas y no requieren Clinical research associate. Cules son las causas? Se desconoce la causa de esta afeccin. Qu incrementa el riesgo? Es ms probable que sufra esta afeccin si: Tiene entre 72 y 11 aos de edad. Tiene antecedentes familiares de lipomas. Cules son los signos o sntomas? Por lo general, el lipoma aparece como una pequea protuberancia redonda debajo de la piel. En la [employer], el bulto tiene las siguientes caractersticas: Se siente suave o elstico. No causa dolor ni otros sntomas. Sin embargo, si el lipoma se encuentra en un rea en la que hace presin [employer] nervios, puede causar dolor u otros sntomas. Cmo se diagnostica? Por lo general, el lipoma puede diagnosticarse con un examen fsico. Tambin pueden hacerle estudios para confirmar el diagnstico y Paramedic otras afecciones. Las pruebas pueden incluir las siguientes: Pruebas de diagnstico por imgenes, como una exploracin por tomografa computarizada (TC) o una resonancia magntica (RM). Extraccin de [place] de tejido para analizar con un microscopio (biopsia). Cmo se trata? El tratamiento de esta afeccin depende del tamao del lipoma y si causa algn sntoma. Los lipomas pequeos que no causan problemas no requieren tratamiento. Si un lipoma se agranda o causa  problemas, puede realizarse [place] para extirpar el lipoma. Los lipomas tambin pueden extirparse para mejorar el aspecto. En la [employer], [place] procedimiento se realiza despus de aplicar un medicamento que adormece el rea (anestesia local). Pueden realizarse una liposuccin para reducir el tamao del lipoma antes de que se lo extraigan quirrgicamente, o bien se la puede realizar para extirpar el lipoma. Los lipomas se extirpan con este mtodo para limitar el tamao de la incisin y las cicatrices. Se introduce un tubo de liposuccin a travs de una pequea incisin en el lipoma y el contenido del lipoma se extrae a travs del tubo mediante succin. Siga estas indicaciones en su casa: Controle el lipoma para detectar cambios. Concurra a [place]. Esto es importante. Dnde obtener ms informacin OrthoInfo: orthoinfo.aaos.org Comunquese con un mdico si: El lipoma se agranda o se endurece. El lipoma comienza a causarle dolor, se enrojece o se hincha cada vez ms. Estos podran ser signos de infeccin o de una afeccin ms grave. Solicite ayuda de inmediato si: Siente hormigueo o adormecimiento en el rea cerca del lipoma. Esto podra indicar que el lipoma es lo que causa dao nervioso. Resumen Un lipoma es un tumor no canceroso formado por clulas de grasa. La mayora de los lipomas no causan problemas y no requieren Clinical research associate. Si un lipoma se agranda o causa problemas, puede realizarse [place] para extirpar el lipoma. Comunquese con un mdico si el lipoma se agranda o se endurece, o si empieza a dolor, se pone de color rojo o se hincha cada vez ms. Estos podran ser signos de infeccin o de [place]  afeccin ms grave. Esta informacin no tiene Marine scientist el consejo del mdico. Asegrese de hacerle al mdico cualquier pregunta que tenga. Document Revised: 01/28/2022 Document Reviewed: 01/28/2022 Elsevier Patient Education  [place].

## 2022-09-07 NOTE — Assessment & Plan Note (Addendum)
Notes slowing of urinary stream without UTI symptoms.  Recent UA reassuring (04/2022).  Trial oxybutynin '5mg'$  QHS PRN. Reviewed possible side effects.  Update if not improving with symptoms.

## 2022-09-07 NOTE — Progress Notes (Signed)
Patient ID: Beth Rodgers, female    DOB: 06/02/1935, 87 y.o.   MRN: [id number]  This visit was conducted in person.  BP 124/70   Pulse 75   Temp (!) 97.5 F (36.4 C) (Temporal)   Ht '5\' 6"'$  (1.676 m)   Wt 173 lb (78.5 kg)   SpO2 97%   BMI 27.92 kg/m    CC: check mass to knee Subjective:   HPI: Beth Rodgers is a 87 y.o. female presenting on 09/07/2022 for Mass (C/o lump above L knee.  Noticed about 3 wks ago.  Seen by surgeon and had [place]. )   Recently saw Dr Candelaria Stagers sports medicine at [facility] after referred from Dr [name] for L knee mass - ultrasound showed 2.9x2.3cm possible lipoma vs postsurgical soft tissue edema. Rec ice PRN.   S/p L total knee arthroplasty ([place]) 04/2022.   No pain.  She's been using ice therapy.   Notes difficulty hearing, despite regular use of hearing aides. Upcoming appt tomorrow with ENT for ear wax removal. She sees [facility] for audiology as well.   Recent BP med change from metoprolol to bisoprolol - she decided to stay with metoprolol.   Notes longstanding urinary hesitancy and slow stream with prolonged urination - no dysuria, urgency, frequency. Notes nocturia x3-4 - she feels she stays well hydrated.  UA reviewed from 04/2022 - WNL.      Relevant past medical, surgical, family and social history reviewed and updated as indicated. Interim medical history since our last visit reviewed. Allergies and medications reviewed and updated. Outpatient Medications Prior to Visit  Medication Sig Dispense Refill   alendronate (FOSAMAX) 70 MG tablet Take 1 tablet (70 mg total) by mouth every Sunday. 12 tablet 3   aspirin EC 81 MG tablet Take 81 mg by mouth in the morning and at bedtime. Swallow whole.     Calcium Carb-Cholecalciferol (CALCIUM 600 + D PO) Take 1 tablet by mouth daily. Ca 500 + Vit D 800     celecoxib (CELEBREX) 200 MG capsule Take 1 capsule (200 mg total) by mouth 2 (two) times daily. (Patient taking differently: Take 200 mg by mouth  daily.) 90 capsule 0   Cholecalciferol (VITAMIN D3) 25 MCG (1000 UT) CAPS Take 1 capsule (1,000 Units total) by mouth daily. 30 capsule    ciclopirox (LOPROX) 0.77 % cream Apply to feet, around toenails, between toes once daily for fungus. 90 g 2   Ciclopirox 0.77 % gel Apply 1 application  topically 2 (two) times daily as needed (fungus).     clobetasol cream (TEMOVATE) 8.84 % Apply 1 application topically as directed. Qd to bid up to 5 days a week to aa psoriasis on body until clear, then prn flares, avoid face, groin, axilla 60 g 2   diclofenac Sodium (VOLTAREN) 1 % GEL Apply topically 3 (three) times daily as needed.     latanoprost (XALATAN) 0.005 % ophthalmic solution latanoprost 0.005 % eye drops  PLACE 1 DROP INTO BOTH EYES ONCE EVERY EVENING     levothyroxine (SYNTHROID) 75 MCG tablet Take 1 tablet (75 mcg total) by mouth daily. 90 tablet 3   losartan (COZAAR) 50 MG tablet Take 1 tablet (50 mg total) by mouth daily. 90 tablet 3   Magnesium 400 MG TABS Take 400 mg by mouth daily.     Multiple Vitamin (MULTIVITAMIN) tablet Take 1 tablet by mouth daily.     Roflumilast (ZORYVE) 0.3 % CREA Apply 1 application.  topically every other day. Sample     SELENIUM PO Take 1 tablet by mouth daily.     silver sulfADIAZINE (SILVADENE) 1 % cream Apply 1 application topically daily. 50 g 4   tacrolimus (PROTOPIC) 0.1 % ointment APPLY TOPICALLY TO AFFECTED AREA(S) TWICE DAILY 30 g 0   vitamin C (ASCORBIC ACID) 500 MG tablet Take 500 mg by mouth daily.     vitamin E 180 MG (400 UNITS) capsule Take 400 Units by mouth daily.     bisoprolol (ZEBETA) 5 MG tablet Take 1 tablet (5 mg total) by mouth daily. (Patient not taking: Reported on 09/07/2022) 30 tablet 11   No facility-administered medications prior to visit.     Per HPI unless specifically indicated in ROS section below Review of Systems  Objective:  BP 124/70   Pulse 75   Temp (!) 97.5 F (36.4 C) (Temporal)   Ht '5\' 6"'$  (1.676 m)   Wt 173 lb  (78.5 kg)   SpO2 97%   BMI 27.92 kg/m   Wt Readings from Last 3 Encounters:  09/07/22 173 lb (78.5 kg)  08/26/22 171 lb 4 oz (77.7 kg)  08/06/22 173 lb 6.4 oz (78.7 kg)      Physical Exam Vitals and nursing note reviewed.  Constitutional:      Appearance: Normal appearance. She is not ill-appearing.  Musculoskeletal:        General: Normal range of motion.     Right lower leg: No edema.     Left lower leg: No edema.     Comments: Small firm mobil mass overlying L lateral knee joint superior to patella.   Skin:    General: Skin is warm and dry.     Findings: No rash.  Neurological:     Mental Status: She is alert.  Psychiatric:        Mood and Affect: Mood normal.        Behavior: Behavior normal.       Results for orders placed or performed in visit on 06/19/22  VITAMIN D 25 Hydroxy (Vit-D Deficiency, Fractures)  Result Value Ref Range   VITD 39.84 30.00 - 100.00 ng/mL  Hemoglobin A1c  Result Value Ref Range   Hgb A1c MFr Bld 5.4 4.6 - 6.5 %  TSH  Result Value Ref Range   TSH 4.68 0.35 - 5.50 uIU/mL  CBC with Differential/Platelet  Result Value Ref Range   WBC 5.9 4.0 - 10.5 K/uL   RBC 4.20 3.87 - 5.11 Mil/uL   Hemoglobin 12.2 12.0 - 15.0 g/dL   HCT 37.3 36.0 - 46.0 %   MCV 88.8 78.0 - 100.0 fl   MCHC 32.7 30.0 - 36.0 g/dL   RDW 15.5 11.5 - 15.5 %   Platelets 342.0 150.0 - 400.0 K/uL   Neutrophils Relative % 59.4 43.0 - 77.0 %   Lymphocytes Relative 21.3 12.0 - 46.0 %   Monocytes Relative 12.0 3.0 - 12.0 %   Eosinophils Relative 6.4 (H) 0.0 - 5.0 %   Basophils Relative 0.9 0.0 - 3.0 %   Neutro Abs 3.5 1.4 - 7.7 K/uL   Lymphs Abs 1.3 0.7 - 4.0 K/uL   Monocytes Absolute 0.7 0.1 - 1.0 K/uL   Eosinophils Absolute 0.4 0.0 - 0.7 K/uL   Basophils Absolute 0.1 0.0 - 0.1 K/uL  Comprehensive metabolic panel  Result Value Ref Range   Sodium 138 135 - 145 mEq/L   Potassium 4.6 3.5 - 5.1 mEq/L   Chloride  102 96 - 112 mEq/L   CO2 27 19 - 32 mEq/L   Glucose, Bld 84  70 - 99 mg/dL   BUN 20 6 - 23 mg/dL   Creatinine, Ser 0.68 0.40 - 1.20 mg/dL   Total Bilirubin 0.6 0.2 - 1.2 mg/dL   Alkaline Phosphatase 131 (H) 39 - 117 U/L   AST 17 0 - 37 U/L   ALT 15 0 - 35 U/L   Total Protein 6.5 6.0 - 8.3 g/dL   Albumin 3.9 3.5 - 5.2 g/dL   GFR 79.59 >60.00 mL/min   Calcium 9.2 8.4 - 10.5 mg/dL    Assessment & Plan:   Problem List Items Addressed This Visit     Total knee replacement status   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      Knee mass, left - Primary    Reviewed recent ortho OV as well as [place] - suspected lipoma.  Discussed lipoma, handout provided. Advised let us know if enlarging mass or new symptoms.       Slowing of urinary stream    Notes slowing of urinary stream without UTI symptoms.  Recent UA reassuring (04/2022).  Trial oxybutynin '5mg'$  QHS PRN. Reviewed possible side effects.  Update if not improving with symptoms.         Meds ordered this encounter  Medications   oxybutynin (DITROPAN) 5 MG tablet    Sig: Take 1 tablet (5 mg total) by mouth at bedtime as needed for bladder spasms.    Dispense:  30 tablet    Refill:  0   No orders of the defined types were placed in this encounter.    Patient instructions: Monitoree el area y nos deja saber si agranda la masa.  Parece que es lipoma o masa de tejido grasoso benigno.   Follow up plan: Return if symptoms worsen or fail to improve.  Ria Bush, MD

## 2022-09-08 DIAGNOSIS — J302 Other seasonal allergic rhinitis: Secondary | ICD-10-CM | POA: Diagnosis not present

## 2022-09-08 DIAGNOSIS — H6063 Unspecified chronic otitis externa, bilateral: Secondary | ICD-10-CM | POA: Diagnosis not present

## 2022-09-08 DIAGNOSIS — H6123 Impacted cerumen, bilateral: Secondary | ICD-10-CM | POA: Diagnosis not present

## 2022-09-15 ENCOUNTER — Telehealth: Payer: Self-pay | Admitting: Family Medicine

## 2022-09-15 ENCOUNTER — Ambulatory Visit: Payer: Medicare Other | Admitting: Psychology

## 2022-09-16 ENCOUNTER — Encounter: Payer: Self-pay | Admitting: Family Medicine

## 2022-09-16 ENCOUNTER — Ambulatory Visit (INDEPENDENT_AMBULATORY_CARE_PROVIDER_SITE_OTHER): Payer: Medicare Other | Admitting: Family Medicine

## 2022-09-16 VITALS — BP 140/82 | HR 60 | Temp 98.3°F | Ht 66.0 in | Wt 173.2 lb

## 2022-09-16 DIAGNOSIS — M25511 Pain in right shoulder: Secondary | ICD-10-CM | POA: Diagnosis not present

## 2022-09-16 DIAGNOSIS — M7501 Adhesive capsulitis of right shoulder: Secondary | ICD-10-CM | POA: Diagnosis not present

## 2022-09-16 MED ORDER — TRIAMCINOLONE ACETONIDE 40 MG/ML IJ SUSP
40.0000 mg | Freq: Once | INTRAMUSCULAR | Status: AC
  Administered 2022-09-16: 40 mg via INTRA_ARTICULAR

## 2022-09-16 NOTE — Progress Notes (Unsigned)
Beth Rodgers T. [name], MD, [place] at [facility] [place], [address]  Phone: [phone]  FAX: [phone]  Beth Rodgers - 87 y.o. female  MRN [id number]  Date of Birth: Aug 25, 1935  Date: 09/16/2022  PCP: Ria Bush, MD  Referral: Ria Bush, MD  Chief Complaint  Patient presents with   Arm Pain    Right (Seen 07/22/22)    Subjective:   Beth Rodgers is a 87 y.o. very pleasant female patient with Body mass index is 27.96 kg/m. who presents with the following:  She presents with R sided frozen shoulder follow-up.  I saw her 07/22/2022 and started her on the [facility] frozen shoulder protocol.  This was in a setting of very recent total knee arthroplasty on the L side.   T-shirt distribution of pain.  She is worried that some of her shoulder and arm pain could be due to her metoprolol.  We reviewed beta-blockers.  She continues to have some restriction of motion, more in the plane of abduction than flexion.  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  Frozen r shoulder, but ROM improves dramatically after injection  Review of Systems is noted in the HPI, as appropriate  Objective:   BP (!) 140/82   Pulse 60   Temp 98.3 F (36.8 C) (Oral)   Ht '5\' 6"'$  (1.676 m)   Wt 173 lb 4 oz (78.6 kg)   SpO2 98%   BMI 27.96 kg/m   GEN: No acute distress; alert,appropriate. PULM: Breathing comfortably in no respiratory distress PSYCH: Normally interactive.    Shoulder: R and L Inspection: No muscle wasting or winging Ecchymosis/edema: neg  AC joint, scapula, clavicle: NT Cervical spine: NT, full ROM Spurling's: neg ABNORMAL SIDE TESTED: Right UNLESS OTHERWISE NOTED, THE CONTRALATERAL SIDE HAS FULL RANGE OF MOTION. Abduction: 5/5, LIMITED TO 145 degrees DEGREES Flexion: 5/5, LIMITED TO 165 DEGNO ROM  IR, lift-off: 5/5. TESTED AT 90  DEGREES OF ABDUCTION, LIMITED TO 15 DEGREES ER at neutral:  5/5, TESTED AT 90 DEGREES OF ABDUCTION, LIMITED TO 80 DEGREES AC crossover and compression: PAIN Drop Test: neg Empty Can: neg Supraspinatus insertion: NT Bicipital groove: NT ALL OTHER SPECIAL TESTING EQUIVOCAL GIVEN LOSS OF MOTION C5-T1 intact Sensation intact Grip 5/5    Laboratory and Imaging Data:  Assessment and Plan:     ICD-10-CM   1. Acute pain of right shoulder  M25.511     2. Adhesive capsulitis of right shoulder  M75.01 triamcinolone acetonide (KENALOG-40) injection 40 mg     I still think that her shoulder is driving her pain and pain location in a T-shirt distribution.  She does have a deep dull ache in the shoulder.  I think at this point, doing an intra-articular injection may help hasten her recovery faster than anything and give her a more immediate pain relief.  I do think she can continue working on her motion as she has been doing, as well.  Intraarticular Shoulder Aspiration/Injection Procedure Note Beth Rodgers Feb 02, 1935 Date of procedure: 09/16/2022  Procedure: Large Joint Aspiration / Injection of Shoulder, Intraarticular, R Indications: Pain  Procedure Details Verbal consent was obtained from the patient. Risks explained and contrasted with benefits and alternatives. Patient prepped with Chloraprep and Ethyl Chloride used for anesthesia. An intraarticular shoulder injection was performed using the posterior approach; needle placed into joint  capsule without difficulty. The patient tolerated the procedure well and had decreased pain post injection. No complications. Injection: 9 cc of Lidocaine 1% and 1 mL Kenalog 40 mg. Needle: 21 gauge, 2 inch   Medication Management during today's office visit: Meds ordered this encounter  Medications   triamcinolone acetonide (KENALOG-40) injection 40 mg   Medications Discontinued During This Encounter  Medication Reason   celecoxib (CELEBREX) 426 MG  capsule Duplicate    Orders placed today for conditions managed today: No orders of the defined types were placed in this encounter.   Disposition: No follow-ups on file.  Dragon [employer] One speech-to-text software was used for transcription in this dictation.  Possible transcriptional errors can occur using Editor, commissioning.   Signed,  Beth Deed. Rilei Kravitz, MD   Outpatient Encounter Medications as of 09/16/2022  Medication Sig   alendronate (FOSAMAX) 70 MG tablet Take 1 tablet (70 mg total) by mouth every Sunday.   aspirin EC 81 MG tablet Take 81 mg by mouth in the morning and at bedtime. Swallow whole.   Calcium Carb-Cholecalciferol (CALCIUM 600 + D PO) Take 1 tablet by mouth daily. Ca 500 + Vit D 800   celecoxib (CELEBREX) 200 MG capsule Take 200 mg by mouth daily.   Cholecalciferol (VITAMIN D3) 25 MCG (1000 UT) CAPS Take 1 capsule (1,000 Units total) by mouth daily.   ciclopirox (LOPROX) 0.77 % cream Apply to feet, around toenails, between toes once daily for fungus.   Ciclopirox 0.77 % gel Apply 1 application  topically 2 (two) times daily as needed (fungus).   clobetasol cream (TEMOVATE) 8.34 % Apply 1 application topically as directed. Qd to bid up to 5 days a week to aa psoriasis on body until clear, then prn flares, avoid face, groin, axilla   diclofenac Sodium (VOLTAREN) 1 % GEL Apply topically 3 (three) times daily as needed.   latanoprost (XALATAN) 0.005 % ophthalmic solution latanoprost 0.005 % eye drops  PLACE 1 DROP INTO BOTH EYES ONCE EVERY EVENING   levothyroxine (SYNTHROID) 75 MCG tablet Take 1 tablet (75 mcg total) by mouth daily.   losartan (COZAAR) 50 MG tablet Take 1 tablet (50 mg total) by mouth daily.   Magnesium 400 MG TABS Take 400 mg by mouth daily.   metoprolol tartrate (LOPRESSOR) 25 MG tablet Take 25 mg by mouth daily.   Multiple Vitamin (MULTIVITAMIN) tablet Take 1 tablet by mouth daily.   Roflumilast (ZORYVE) 0.3 % CREA Apply 1 application. topically  every other day. Sample   SELENIUM PO Take 1 tablet by mouth daily.   silver sulfADIAZINE (SILVADENE) 1 % cream Apply 1 application topically daily.   tacrolimus (PROTOPIC) 0.1 % ointment APPLY TOPICALLY TO AFFECTED AREA(S) TWICE DAILY   vitamin C (ASCORBIC ACID) 500 MG tablet Take 500 mg by mouth daily.   vitamin E 180 MG (400 UNITS) capsule Take 400 Units by mouth daily.   bisoprolol (ZEBETA) 5 MG tablet Take 1 tablet (5 mg total) by mouth daily. (Patient not taking: Reported on 09/07/2022)   oxybutynin (DITROPAN) 5 MG tablet Take 1 tablet (5 mg total) by mouth at bedtime as needed for bladder spasms. (Patient not taking: Reported on 09/16/2022)   [DISCONTINUED] celecoxib (CELEBREX) 200 MG capsule Take 1 capsule (200 mg total) by mouth 2 (two) times daily. (Patient taking differently: Take 200 mg by mouth daily.)   [EXPIRED] triamcinolone acetonide (KENALOG-40) injection 40 mg    No facility-administered encounter medications on file as of 09/16/2022.

## 2022-09-21 ENCOUNTER — Other Ambulatory Visit: Payer: Self-pay | Admitting: Family Medicine

## 2022-09-21 ENCOUNTER — Telehealth: Payer: Self-pay

## 2022-09-21 NOTE — Telephone Encounter (Signed)
Per access nurse note pt complied with going to ED; sending note to DR Darnell Level and Lattie Haw CMA.

## 2022-09-21 NOTE — Telephone Encounter (Signed)
She should be on losartan '50mg'$  daily, metoprolol '25mg'$  - is she taking this once or twice daily?  Has she missed any doses or had increased salt/sodium in diet to account for increase in BP?  Would offer to add her on at 4:30pm tomorrow. If any chest pain /dyspnea in interim, recommend she call 911.

## 2022-09-21 NOTE — Telephone Encounter (Signed)
Spoke to patient by telephone and was advised that she decided not to go to the ER. Patient stated that her symptoms were bad Saturday and Sunday. Patient stated that her blood pressure today has been better. Patient stated that her blood pressure today at 12:53 pm was 159/82 and 2:00 pm 149/73 HR 84. Patient stated that she has not had any chest discomfort since lunch. Patient stated that she does not know that they will do much now since her blood pressure is better and she is not currently having any chest discomfort. Patient was advised that this information will be relayed to Dr. Danise Mina and see what he recommends.

## 2022-09-21 NOTE — Telephone Encounter (Signed)
Elevated BP with chest discomfort.  I don't see where she's been seen at ER yet.  Can we check on her? Thanks.

## 2022-09-21 NOTE — Addendum Note (Signed)
Addended by: Ria Bush on: 09/21/2022 05:13 PM   Modules accepted: Orders

## 2022-09-21 NOTE — Telephone Encounter (Signed)
[facility] - Client TELEPHONE ADVICE RECORD [facility] Patient Name: Beth Rodgers Gender: Female DOB: [birth year] Age: 87 Y 33 M 8 D Return Phone Number: [phone] (Primary) Address: City/ State/ Zip: [place]  [address] Client [facility] - Client Client Site [place] Provider Ria Bush - MD Contact Type Call Who Is Calling Patient / Member / Family / Caregiver Call Type Triage / Clinical Relationship To Patient Self Return Phone Number [phone] (Primary) Chief Complaint CHEST PAIN - pain, pressure, heaviness or tightness Reason for Call Symptomatic / Request for [place] states she is having high blood pressure. 180/90 this weekend and now 132/75, light headed, dizzy, chest tightness Translation No Nurse Assessment Nurse: Clovis Riley, RN, Georgina Peer Date/Time (Eastern Time): 09/21/2022 10:43:40 AM Confirm and document reason for call. If symptomatic, describe symptoms. ---Caller states she is having high blood pressure for the last 3 days 9/24 was 173/94 at 0900, 176/91 at 1100, 151/80 at 1141, 134/70 at 1600, 147/79 at 2300. This morning is 132/75. States she is having chest tightness/fullness, Does the patient have any new or worsening symptoms? ---Yes Will a triage be completed? ---Yes Related visit to physician within the last 2 weeks? ---No Does the PT have any chronic conditions? (i.e. diabetes, asthma, this includes High risk factors for pregnancy, etc.) ---Yes List chronic conditions. ---HTN Is this a behavioral health or substance abuse call? ---No Guidelines Guideline Title Affirmed Question Affirmed Notes Nurse Date/Time Eilene Ghazi Time) Chest Pain [1] Chest pain lasts > 5 minutes AND [2] age > 40 Clovis Riley, RN, Georgina Peer 09/21/2022 10:48:39 AM Disp. Time Eilene Ghazi Time) Disposition Final User 09/21/2022 10:42:22 AM Send to Urgent  August Albino, Kermit Balo PLEASE NOTE: All timestamps contained within this report are represented as [place] Standard Time. CONFIDENTIALTY NOTICE: This fax transmission is intended only for the addressee. It contains information that is legally privileged, confidential or otherwise protected from use or disclosure. If you are not the intended recipient, you are strictly prohibited from reviewing, disclosing, copying using or disseminating any of this information or taking any action in reliance on or regarding this information. If you have received this fax in error, please notify us immediately by telephone so that we can arrange for its return to [place]. Phone: [phone], Toll-Free: [phone], Fax: [phone] Page: 2 of 2 Call Id: [id number] [place]. Time Eilene Ghazi Time) Disposition Final User 09/21/2022 10:51:18 AM Call EMS 911 Now Yes [place], RN, Georgina Peer 09/21/2022 10:52:21 AM 911 Outcome Documentation Clovis Riley, RN, Georgina Peer Reason: refuses 911 states her husband will take her to the ER Final Disposition 09/21/2022 10:51:18 AM Call EMS 911 Now Yes Clovis Riley, RN, Leilani Merl Disagree/Comply Comply Caller Understands Yes PreDisposition Call Doctor Care Advice Given Per Guideline CALL EMS 911 NOW: * Immediate medical attention is needed. You need to hang up and call 911 (or an ambulance). * Triager Discretion: I'll call you back in a few minutes to be sure you were able to reach them. NOTE TO TRIAGER - IF CALLER ASKS ABOUT ASPIRIN: * Call EMS 911 first. CARE ADVICE given per Chest Pain (Adult) guideline. Referrals [place]

## 2022-09-21 NOTE — Telephone Encounter (Signed)
Spoke to patient by telephone and was advised that she is taking Losartan 50 mg daily and Metoprolol 25 mg 1/2 twice a day. Patient stated that she missed all of her medication Saturday morning and did not take them until 5:00 pm. Patient stated her blood pressure at 4:30 pm today was 142/68 and HR 63. Patient denies any chest pain or discomfort since lunch. Patient is going to monitor her blood pressure tonight and will call back tomorrow with an updated. Patient was advised to call 911 if her symptoms come back and she verbalized understanding.

## 2022-09-22 ENCOUNTER — Ambulatory Visit (INDEPENDENT_AMBULATORY_CARE_PROVIDER_SITE_OTHER): Payer: Medicare Other | Admitting: Dermatology

## 2022-09-22 DIAGNOSIS — I872 Venous insufficiency (chronic) (peripheral): Secondary | ICD-10-CM | POA: Diagnosis not present

## 2022-09-22 DIAGNOSIS — D692 Other nonthrombocytopenic purpura: Secondary | ICD-10-CM | POA: Diagnosis not present

## 2022-09-22 DIAGNOSIS — B351 Tinea unguium: Secondary | ICD-10-CM

## 2022-09-22 DIAGNOSIS — B353 Tinea pedis: Secondary | ICD-10-CM

## 2022-09-22 DIAGNOSIS — L409 Psoriasis, unspecified: Secondary | ICD-10-CM | POA: Diagnosis not present

## 2022-09-22 DIAGNOSIS — L720 Epidermal cyst: Secondary | ICD-10-CM

## 2022-09-22 DIAGNOSIS — L821 Other seborrheic keratosis: Secondary | ICD-10-CM | POA: Diagnosis not present

## 2022-09-22 MED ORDER — TACROLIMUS 0.1 % EX OINT: TOPICAL_OINTMENT | CUTANEOUS | 2 refills | Status: DC

## 2022-09-22 NOTE — Patient Instructions (Addendum)
Bruising at arms  Can use OTC arnica containing moisturizer such as Dermend Bruise Formula if desired   For Psoriasis  Continue Cerave Sa Cream Continue Tacrolimus ointment to affected areas daily Sample of Zoryve cream can use to affected areas of ears and body  Continue triamcinolone cream as needed for flares    For Stasis dermatitis   Continue Silvadene cream apply to affected areas feet/ankles daily  Cont Tacrolimus ointment daily to twice daily as needed for itchy rash Cont Support stockings daily   For Scar at leg Recommend Serica moisturizing scar formula cream every night or [facility] brand or Mederma silicone scar sheet every night for the first year after a scar appears to help with scar remodeling if desired. Scars remodel on their own for a full year and will gradually improve in appearance over time.   For toenails and feet  Continue Cerave Sa Cream to any rough areas of body  Continue Ciclopirox     Seborrheic Keratosis  What causes seborrheic keratoses? Seborrheic keratoses are harmless, common skin growths that first appear during adult life.  As time goes by, more growths appear.  Some people may develop a large number of them.  Seborrheic keratoses appear on both covered and uncovered body parts.  They are not caused by sunlight.  The tendency to develop seborrheic keratoses can be inherited.  They vary in color from skin-colored to gray, brown, or even black.  They can be either smooth or have a rough, warty surface.   Seborrheic keratoses are superficial and look as if they were stuck on the skin.  Under the microscope this type of keratosis looks like layers upon layers of skin.  That is why at times the top layer may seem to fall off, but the rest of the growth remains and re-grows.    Treatment Seborrheic keratoses do not need to be treated, but can easily be removed in the office.  Seborrheic keratoses often cause symptoms when they rub on clothing or  jewelry.  Lesions can be in the way of shaving.  If they become inflamed, they can cause itching, soreness, or burning.  Removal of a seborrheic keratosis can be accomplished by freezing, burning, or surgery. If any spot bleeds, scabs, or grows rapidly, please return to have it checked, as these can be an indication of a skin cancer.    Due to recent changes in healthcare laws, you may see results of your pathology and/or laboratory studies on MyChart before the doctors have had a chance to review them. We understand that in some cases there may be results that are confusing or concerning to you. Please understand that not all results are received at the same time and often the doctors may need to interpret multiple results in order to provide you with the best plan of care or course of treatment. Therefore, we ask that you please give [place] 2 business days to thoroughly review all your results before contacting the office for clarification. Should we see a critical lab result, you will be contacted sooner.   If You Need Anything After Your Visit  If you have any questions or concerns for your doctor, please call our main line at [phone] and press option 4 to reach your doctor's medical assistant. If no one answers, please leave a voicemail as directed and we will return your call as soon as possible. Messages left after 4 pm will be answered the following business day.   You may  also send [place] a message via MyChart. We typically respond to MyChart messages within 1-2 business days.  For prescription refills, please ask your pharmacy to contact our office. Our fax number is [phone].  If you have an urgent issue when the clinic is closed that cannot wait until the next business day, you can page your doctor at the number below.    Please note that while we do our best to be available for urgent issues outside of office hours, we are not available 24/7.   If you have an urgent issue and are  unable to reach [place], you may choose to seek medical care at your doctor's office, retail clinic, urgent care center, or emergency room.  If you have a medical emergency, please immediately call 911 or go to the emergency department.  Pager Numbers  - Dr. Nehemiah Massed: [phone]  - Dr. Laurence Ferrari: [phone]  - Dr. Nicole Kindred: [phone]  In the event of inclement weather, please call our main line at [phone] for an update on the status of any delays or closures.  Dermatology Medication Tips: Please keep the boxes that topical medications come in in order to help keep track of the instructions about where and how to use these. Pharmacies typically print the medication instructions only on the boxes and not directly on the medication tubes.   If your medication is too expensive, please contact our office at [phone] option 4 or send [place] a message through [place].   We are unable to tell what your co-pay for medications will be in advance as this is different depending on your insurance coverage. However, we may be able to find a substitute medication at lower cost or fill out paperwork to get insurance to cover a needed medication.   If a prior authorization is required to get your medication covered by your insurance company, please allow [place] 1-2 business days to complete this process.  Drug prices often vary depending on where the prescription is filled and some pharmacies may offer cheaper prices.  The website www.goodrx.com contains coupons for medications through different pharmacies. The prices here do not account for what the cost may be with help from insurance (it may be cheaper with your insurance), but the website can give you the price if you did not use any insurance.  - You can print the associated coupon and take it with your prescription to the pharmacy.  - You may also stop by our office during regular business hours and pick up a GoodRx coupon card.  - If you need your  prescription sent electronically to a different pharmacy, notify our office through [facility] or by phone at [phone] option 4.     Si Usted Necesita Algo Despus de Su Visita  Tambin puede enviarnos un mensaje a travs de Pharmacist, community. Por lo general respondemos a los mensajes de MyChart en el transcurso de 1 a 2 das hbiles.  Para renovar recetas, por favor pida a su farmacia que se ponga en contacto con nuestra oficina. Harland Dingwall de fax es [place] [phone].  Si tiene un asunto urgente cuando la clnica est cerrada y que no puede esperar hasta el siguiente da hbil, puede llamar/localizar a su doctor(a) al nmero que aparece a continuacin.   Por favor, tenga en cuenta que aunque hacemos todo lo posible para estar disponibles para asuntos urgentes fuera del horario de [place], no estamos disponibles las 24 horas del da, los 7 das de la [place].   Si  tiene un problema urgente y no puede comunicarse con nosotros, puede optar por buscar atencin mdica  en el consultorio de su doctor(a), en una clnica privada, en un centro de atencin urgente o en una sala de emergencias.  Si tiene Engineering geologist, por favor llame inmediatamente al 911 o vaya a la sala de emergencias.  Nmeros de bper  - Dr. Nehemiah Massed: [phone]  - Dra. Moye: [phone]  - Dra. Nicole Kindred: [phone]  En caso de inclemencias del [place], por favor llame a Johnsie Kindred principal al [phone] para una actualizacin sobre el [place] de cualquier retraso o cierre.  Consejos para la medicacin en dermatologa: Por favor, guarde las cajas en las que vienen los medicamentos de uso tpico para ayudarle a seguir las instrucciones sobre dnde y cmo usarlos. Las farmacias generalmente imprimen las instrucciones del medicamento slo en las cajas y no directamente en los tubos del [place].   Si su medicamento es muy caro, por favor, pngase en contacto con Zigmund Daniel llamando al [phone]  y presione la opcin 4 o envenos un mensaje a travs de Pharmacist, community.   No podemos decirle cul ser su copago por los medicamentos por adelantado ya que esto es diferente dependiendo de la cobertura de su seguro. Sin embargo, es posible que podamos encontrar un medicamento sustituto a Electrical engineer un formulario para que el seguro cubra el medicamento que se considera necesario.   Si se requiere una autorizacin previa para que su compaa de seguros [place] su medicamento, por favor permtanos de 1 a 2 das hbiles para completar este proceso.  Los precios de los medicamentos varan con frecuencia dependiendo del Environmental consultant de dnde se surte la receta y alguna farmacias pueden ofrecer precios ms baratos.  El sitio web www.goodrx.com tiene cupones para medicamentos de Airline pilot. Los precios aqu no tienen en cuenta lo que podra costar con la ayuda del seguro (puede ser ms barato con su seguro), pero el sitio web puede darle el precio si no utiliz Research scientist (physical sciences).  - Puede imprimir el cupn correspondiente y llevarlo con su receta a la farmacia.  - Tambin puede pasar por nuestra oficina durante el horario de atencin regular y Charity fundraiser una tarjeta de cupones de GoodRx.  - Si necesita que su receta se enve electrnicamente a una farmacia diferente, informe a nuestra oficina a travs de MyChart de [place] o por telfono llamando al [phone] y presione la opcin 4.

## 2022-09-22 NOTE — Telephone Encounter (Signed)
Noted. Will await update.

## 2022-09-22 NOTE — Progress Notes (Signed)
Follow-Up Visit   Subjective  Beth Rodgers is a 87 y.o. female who presents for the following: Psoriasis (3 month psoriasis and stasis dermatitis at both legs. Patient is currently flared at left thigh, ears, and elbows. Using tacrolimus ointment, cerave sa cream and tmc prn for flares. /For stasis dermatitis continuing compression stockings, silvadene cream and tacrolimus ointment to b/l legs. ).  The following portions of the chart were reviewed this encounter and updated as appropriate:      Review of Systems: No other skin or systemic complaints except as noted in HPI or Assessment and Plan.   Objective  Well appearing patient in no apparent distress; mood and affect are within normal limits.  A focused examination was performed including ears, legs, back, left thigh, left shoulder . Relevant physical exam findings are noted in the Assessment and Plan.  left lateral thigh, elbows, back, ears, arms Pink scaly patch on left lateral thigh, elbows, erythema and scale at postauricular crease   lower legs Pitting edema with hyperpigmented indurated plaque at bilateral ankles   toenails Dry flaky skin toes/web spaces with hyperkeratotic, thickened toenails  Left Knee - Anterior 2.5 cm firm movable sbq nodule    Assessment & Plan  Psoriasis left lateral thigh, elbows, back, ears, arms  Chronic and persistent condition with duration or expected duration over one year. Condition is bothersome/symptomatic for patient. Not at goal.   Psoriasis is a chronic non-curable, but treatable genetic/hereditary disease that may have other systemic features affecting other organ systems such as joints (Psoriatic Arthritis). It is associated with an increased risk of inflammatory bowel disease, heart disease, non-alcoholic fatty liver disease, and depression.    Start samples given of Zoryve to apply to aa's at ears and body once daily  Continue tacrolimus 0.1 % ointment qd.  Continue tmc 0.1  % prn for flares Continue CeraVe SA cream daily.  Topical steroids (such as triamcinolone, fluocinolone, fluocinonide, mometasone, clobetasol, halobetasol, betamethasone, hydrocortisone) can cause thinning and lightening of the skin if they are used for too long in the same area. Your physician has selected the right strength medicine for your problem and area affected on the body. Please use your medication only as directed by your physician to prevent side effects.      tacrolimus (PROTOPIC) 0.1 % ointment - left lateral thigh, elbows, back, ears, arms APPLY TOPICALLY TO AFFECTED AREA(S) TWICE DAILY  Stasis dermatitis of both legs lower legs  With lipodermatosclerosis - Chronic condition, no cure, controlled on current therapy    Cont Silvadene cream apply to aas feet/ankles daily  Cont Tacrolimus ointment qd-bid prn itchy rash Cont Support stockings daily   Stasis in the legs causes chronic leg swelling, which may result in itchy or painful rashes, skin discoloration, skin texture changes, and sometimes ulceration.  Recommend daily graduated compression hose/stockings- easiest to put on first thing in morning, remove at bedtime.  Elevate legs as much as possible. Avoid salt/sodium rich foods.   Lipodermatosclerosis is a chronic inflammatory condition of unknown cause of the subcutaneous fat causing tenderness, discoloration and hardening of the involved skin, most commonly on the lower legs. Discussed that it may progress and gradually worsen over time, especially in the setting of chronic leg swelling. Daily compression stockings/hose is recommended.   Tinea pedis of both feet toenails  With tinea unguium- improving   Continue Ciclopirox Cream - apply around toenails, toes, and between toes to prevent spread.   Continue CeraVe SA Cream to  feet and heels 1-2x daily.    Epidermal cyst Left Knee - Anterior  Possible cyst, came up after recent knee surgery.  Recommend  observation, and possible removal by ortho if changing   Seborrheic Keratoses At left medial shoulder  - Stuck-on, waxy, tan-brown papules and/or plaques  - Benign-appearing - Discussed benign etiology and prognosis. - Observe - Call for any changes  Purpura - Chronic; persistent and recurrent.  Treatable, but not curable. At arms  - Violaceous macules and patches - Benign - Related to trauma, age, sun damage and/or use of blood thinners, chronic use of topical and/or oral steroids - Observe - Can use OTC arnica containing moisturizer such as Dermend Bruise Formula if desired - Call for worsening or other concerns  Return in about 6 months (around 03/23/2023) for 6 month psoriasis follow up. I, Ruthell Rummage, CMA, am acting as scribe for [name], MD.  Documentation: I have reviewed the above documentation for accuracy and completeness, and I agree with the above.  [name] MD

## 2022-09-28 ENCOUNTER — Telehealth: Payer: Self-pay | Admitting: Cardiovascular Disease

## 2022-09-28 ENCOUNTER — Telehealth: Payer: Self-pay | Admitting: Family Medicine

## 2022-09-28 MED ORDER — LOSARTAN POTASSIUM 50 MG PO TABS: ORAL_TABLET | ORAL | Status: DC

## 2022-09-28 NOTE — Telephone Encounter (Signed)
Pt called to update Dr. Darnell Level on bp levels. Pt states her bp has been up & down, higher # being 138 to 160. Pt wanted to know if her cardiologist should be updated on it so her prescription can be changed? Call back # [phone]

## 2022-09-28 NOTE — Telephone Encounter (Signed)
Patient says that her BP is spiking and don't know what to do about it. Will be going out of town for 12 days. Patient is leaving tomorrow and want to know what to do before leaving

## 2022-09-28 NOTE — Telephone Encounter (Signed)
I spoke with the patient.  She was seen on 08/26/22 by Dr. Rockey Situ. She has a history of hypertension and was advised to keep a check on her BP readings at home.  She was 150/82 in the office on 08/26/22.  Today she reports SBP over the last 10 days of: 188 164 175 177 168 152  Today she was: 136/68 (noon), 150/75 (3pm), 140/77 (4pm)  HR's are 60-70's.  I have confirmed with her that she is currently taking: - Losartan 50 mg once daily at 1 am (due to her sleep schedule) - Metoprolol tartrate 25 mg: 0.5 tablet BID (1 am & 1 pm)  She states she feels secure walking around in her home, but will use a cane when outside the home.  No complaints of dizziness/ lightheadededness.  She will be leaving on 09/30/22 for a 12 day trip to [place].   I have reviewed the above readings with Dr. Rockey Situ. Orders received to: - increase losartan to 50 mg BID (take with metoprolol doses) - monitor BP's - call with readings.  The patient has been made aware of the above MD recommendations, voices understanding and is agreeable. She will monitor her BP/HR and call with some updated readings after returning home from her trip.   She was very appreciative of the call today.  She does not need an updated RX at the pharmacy at this time.

## 2022-09-29 ENCOUNTER — Ambulatory Visit (INDEPENDENT_AMBULATORY_CARE_PROVIDER_SITE_OTHER): Payer: Medicare Other | Admitting: Psychology

## 2022-09-29 DIAGNOSIS — F4323 Adjustment disorder with mixed anxiety and depressed mood: Secondary | ICD-10-CM | POA: Diagnosis not present

## 2022-09-29 NOTE — Telephone Encounter (Signed)
Patient notified as instructed by telephone and verbalized understanding. Patient stated that she went ahead and called her cardiologist yesterday. Patient stated that they are making some adjustments on her medications. Patient stated that she will keep her cardiologist informed of her blood pressure.

## 2022-09-29 NOTE — Telephone Encounter (Signed)
BP is staying a bit high. Do agree with her contacting cardiology for updated meds.

## 2022-09-29 NOTE — Progress Notes (Signed)
[facility] Counselor/Therapist Progress Note  Patient ID: Beth Rodgers, MRN: [id number]    Date: 09/29/22  Time Spent: 1:03 pm - 1:47 pm : 44 Minutes  Treatment Type: Individual Therapy.  Reported Symptoms: Rumination, anxiety.    Mental Status Exam: Appearance:  NA     Behavior: Appropriate  Motor: NA  Speech/Language:  Clear and Coherent and Normal Rate  Affect: Congruent  Mood: dysthymic  Thought process: normal  Thought content:   WNL  Sensory/Perceptual disturbances:   WNL  Orientation: oriented to person, place, time/date, and situation  Attention: Good  Concentration: Good  Memory: WNL  Fund of knowledge:  Good  Insight:   Good  Judgment:  Good  Impulse Control: Good   Risk Assessment: Danger to Self:  No Self-injurious Behavior: No  Danger to Others: No Duty to Warn:no Physical Aggression / Violence:No  Access to Firearms a concern: No  Gang Involvement:No   Subjective:   Beth Rodgers participated from home, via phone, and consented to treatment. Therapist participated from home office. We met online due to [place] pandemic. Beth Rodgers reviewed the events of the past two weeks. Beth Rodgers  noted her son's court date today and noted feeling an increased sense of anxiety. She noted Beth Rodgers not engendering himself with others or the community, as a whole. She noted a positive visit with her other son. She noted her husband's decline in mobility and discussed her "job" to be more engaging and interactive. She noted her effort to get her and Beth Rodgers to sit down and discuss finances and long-term financial planning. Therapist praised Beth Rodgers for expressing concerns and following through with her husband. We discussed working on focusing on positives occurring and to minimize rumination regarding negatives. Therapist encouraged continued self-care and boundaries regarding rumination. Therapist praised Beth Rodgers and provided supportive therapy.     Interventions: Interpersonal &  CBT  Diagnosis: Adjustment disorder with mixed anxiety and depressed mood  Plan: Patient is to use CBT, BA, Problem-solving, mindfulness and coping skills to help manage decrease symptoms associated with their diagnosis. (Target date: 10/16/22)   Long-term goal:   Reduce overall level, frequency, and intensity of the feelings of depression and anxiety evidenced by   decreased sadness, rumination, lethargy, and interpersonal stressors  from 6 to 7 days/week to 0 to 1 days/week per client report for at least 3 consecutive months.  Short-term goal:  Verbally express understanding of the relationship between feelings of depression, anxiety and their impact on thinking patterns and behaviors.  Verbalize an understanding of the role that distorted thinking plays in creating fears, excessive worry, and ruminations.  Engage in enjoyable activities on a consistent basis.  Beth Irish, LCSW

## 2022-09-30 NOTE — Telephone Encounter (Signed)
Error

## 2022-10-13 ENCOUNTER — Ambulatory Visit (INDEPENDENT_AMBULATORY_CARE_PROVIDER_SITE_OTHER): Payer: Medicare Other | Admitting: Psychology

## 2022-10-13 DIAGNOSIS — F4323 Adjustment disorder with mixed anxiety and depressed mood: Secondary | ICD-10-CM

## 2022-10-13 NOTE — Progress Notes (Signed)
[facility] Counselor/Therapist Progress Note  Patient ID: Beth Rodgers, MRN: [id number]    Date: 10/14/22  Time Spent: 1:02 pm - 1:58 pm : 56 Minutes  Treatment Type: Individual Therapy.  Reported Symptoms: Rumination, anxiety.    Mental Status Exam: Appearance:  NA     Behavior: Appropriate  Motor: NA  Speech/Language:  Clear and Coherent and Normal Rate  Affect: Congruent  Mood: dysthymic  Thought process: normal  Thought content:   WNL  Sensory/Perceptual disturbances:   WNL  Orientation: oriented to person, place, time/date, and situation  Attention: Good  Concentration: Good  Memory: WNL  Fund of knowledge:  Good  Insight:   Good  Judgment:  Good  Impulse Control: Good   Risk Assessment: Danger to Self:  No Self-injurious Behavior: No  Danger to Others: No Duty to Warn:no Physical Aggression / Violence:No  Access to Firearms a concern: No  Gang Involvement:No   Subjective:   Beth Rodgers participated from home, via phone, and consented to treatment. Therapist participated from office. We met online due to [place] pandemic. Beth Rodgers reviewed the events of the past two weeks. Beth Rodgers  noted her son, Beth Rodgers, recently jailed due to not showing up to court. She noted worry of her son and noted difficulty leaving to her home country due to this worry about what his life will look like here. We explored this during the session and identified Beth Rodgers's fear of what might happen when she's out of the country. She noted her attempts to balance her needs with the needs of her son and her role as a mother. We explored this during the session and ways to manage her anxiety and make a decision about her travel. Therapist validated and normalized her feelings and experience. Therapist praised Beth Rodgers and provided supportive therapy.     Interventions: Interpersonal & CBT  Diagnosis: Adjustment disorder with mixed anxiety and depressed mood  Plan: Patient is to use CBT, BA,  Problem-solving, mindfulness and coping skills to help manage decrease symptoms associated with their diagnosis. (Target date: 10/16/22)   Long-term goal:   Reduce overall level, frequency, and intensity of the feelings of depression and anxiety evidenced by   decreased sadness, rumination, lethargy, and interpersonal stressors  from 6 to 7 days/week to 0 to 1 days/week per client report for at least 3 consecutive months.  Short-term goal:  Verbally express understanding of the relationship between feelings of depression, anxiety and their impact on thinking patterns and behaviors.  Verbalize an understanding of the role that distorted thinking plays in creating fears, excessive worry, and ruminations.  Engage in enjoyable activities on a consistent basis.  Beth Irish, LCSW

## 2022-10-19 ENCOUNTER — Telehealth: Payer: Self-pay | Admitting: Cardiovascular Disease

## 2022-10-19 ENCOUNTER — Ambulatory Visit: Payer: Medicare Other | Admitting: Oncology

## 2022-10-19 NOTE — Telephone Encounter (Signed)
Pt c/o BP issue: STAT if pt c/o blurred vision, one-sided weakness or slurred speech  1. What are your last 5 BP readings?  132/118 HR 91 92/63     HR 91 101/64 92/63 132/118  HR 70 -- All of these readings are within 15 mins.   2. Are you having any other symptoms (ex. Dizziness, headache, blurred vision, passed out)? She says that she is feeling anxious and like she is floating.   3. What is your BP issue? Pt states that her BP is going up and down quickly and would like to know if she should take her medication or hold off.

## 2022-10-19 NOTE — Telephone Encounter (Signed)
Returned call to patient who reports concerns about her blood pressure being low today. Pt states she had checked her Bp multiple times today with the last check right before our call.  Her Bp was 94/64.  Pt states she had some slight pressure in her chest which she felt was anxiety about her Bp and whether or not she should take her Bp medications which she takes at 1PM and 1AM (she stays awake late). Pt denies nausea and reports the slight pressure in her chest resolved during our call because she felt relaxed. RN explained to patient she should take her BP 2 hrs after her medications and at least 5 minutes of rest and relaxation. RN instructed patient if her Bp is less than 100 she should hold her losartan and metoprolol until her 1 AM dose and a blood pressure check.  Pt will resume medications if her Bp systolic is greater than 859.  Pt will contact the office if her symptoms worsen. RN requested pt call back tomorrow with Bp readings. RN will forward information to Dr. Rockey Situ. Georgana Curio MHA RN CCM

## 2022-10-21 NOTE — Telephone Encounter (Signed)
Noted  

## 2022-10-21 NOTE — Telephone Encounter (Signed)
I called to follow up with the pt and she reports that she is feeling "very well"... her BP yesterday was 139/74 HR 66 in the morning and 132/67 HR 69 in the night.... she takes her meds at noon and midnight and this just works for her schedule... she will continue to monitor and hydrate and let us know if she has any further difficulties and if anything changes.

## 2022-10-27 ENCOUNTER — Ambulatory Visit (INDEPENDENT_AMBULATORY_CARE_PROVIDER_SITE_OTHER): Payer: Medicare Other | Admitting: Psychology

## 2022-10-27 DIAGNOSIS — F4323 Adjustment disorder with mixed anxiety and depressed mood: Secondary | ICD-10-CM

## 2022-10-27 NOTE — Progress Notes (Unsigned)
[facility] Counselor/Therapist Progress Note  Patient ID: Beth Rodgers, MRN: [id number]    Date: 10/27/22  Time Spent: 1:03 pm - 1:35 pm : 32 Minutes  Treatment Type: Individual Therapy.  Reported Symptoms: Rumination, anxiety.    Mental Status Exam: Appearance:  NA     Behavior: Appropriate  Motor: NA  Speech/Language:  Clear and Coherent and Normal Rate  Affect: Congruent  Mood: dysthymic  Thought process: normal  Thought content:   WNL  Sensory/Perceptual disturbances:   WNL  Orientation: oriented to person, place, time/date, and situation  Attention: Good  Concentration: Good  Memory: WNL  Fund of knowledge:  Good  Insight:   Good  Judgment:  Good  Impulse Control: Good   Risk Assessment: Danger to Self:  No Self-injurious Behavior: No  Danger to Others: No Duty to Warn:no Physical Aggression / Violence:No  Access to Firearms a concern: No  Gang Involvement:No   Subjective:   Beth Rodgers participated from home, via phone, and consented to treatment. Therapist participated from office. We met online due to Beth Rodgers pandemic. Beth Rodgers reviewed the events of the past two weeks. Beth Rodgers  discussed the status of her son's incarceration.  He is currently in jail and is now up regarding possible release.  She noted her hope that he would correct course and begin to make healthy positive decisions for himself and his future.  She discussed often seeing glimmers of positive behavior of this behavior Beth Rodgers have not been consistent.  We explored her feelings regarding this including sadness and disappointment.  We worked on reframing these behaviors and discussed ways to extrapolate data due to his long history of substance use, unaddressed mood, and legal concerns.  We discussed looking at each individual experience as its own.  Additionally, Beth Rodgers Discussed her own stress regarding her home country's politics and her worry that things might develop.  We processed this  during the session.  Therapist validated and normalized Beth Rodgers's experience and feelings and provided supportive therapy.  Beth Rodgers continues to benefit from treatment of follow-up was scheduled.   Interventions: Interpersonal & CBT  Diagnosis: Adjustment disorder with mixed anxiety and depressed mood  Plan: Patient is to use CBT, BA, Problem-solving, mindfulness and coping skills to help manage decrease symptoms associated with their diagnosis. (Target date: 11/16/22)   Long-term goal:   Reduce overall level, frequency, and intensity of the feelings of depression and anxiety evidenced by   decreased sadness, rumination, lethargy, and interpersonal stressors  from 6 to 7 days/week to 0 to 1 days/week per client report for at least 3 consecutive months.  Short-term goal:  Verbally express understanding of the relationship between feelings of depression, anxiety and their impact on thinking patterns and behaviors.  Verbalize an understanding of the role that distorted thinking plays in creating fears, excessive worry, and ruminations.  Engage in enjoyable activities on a consistent basis.  Beth Irish, LCSW

## 2022-10-30 ENCOUNTER — Inpatient Hospital Stay: Payer: Medicare Other | Attending: Oncology | Admitting: Oncology

## 2022-10-30 ENCOUNTER — Encounter: Payer: Self-pay | Admitting: Oncology

## 2022-10-30 VITALS — BP 152/93 | HR 108 | Temp 96.6°F | Resp 20 | Wt 167.7 lb

## 2022-10-30 DIAGNOSIS — Z1231 Encounter for screening mammogram for malignant neoplasm of breast: Secondary | ICD-10-CM

## 2022-10-30 DIAGNOSIS — Z170 Estrogen receptor positive status [ER+]: Secondary | ICD-10-CM | POA: Insufficient documentation

## 2022-10-30 DIAGNOSIS — Z9012 Acquired absence of left breast and nipple: Secondary | ICD-10-CM | POA: Diagnosis not present

## 2022-10-30 DIAGNOSIS — C50812 Malignant neoplasm of overlapping sites of left female breast: Secondary | ICD-10-CM | POA: Insufficient documentation

## 2022-10-30 DIAGNOSIS — M8580 Other specified disorders of bone density and structure, unspecified site: Secondary | ICD-10-CM | POA: Diagnosis not present

## 2022-10-30 DIAGNOSIS — Z79899 Other long term (current) drug therapy: Secondary | ICD-10-CM | POA: Insufficient documentation

## 2022-10-30 NOTE — Progress Notes (Signed)
Hematology/Oncology Consult note [facility]  [phone] [phone]  Patient Care Team: Ria Bush, MD as PCP - General (Family Medicine) Rico Junker, RN as Registered Nurse Charlton Haws, [facility] as Pharmacist (Pharmacist)   Name of the patient: Beth Rodgers  [id number]  02-Feb-1935   Date of visit: 10/30/22  Diagnosis- pathological prognostic stage Ia invasive mammary carcinoma of the left breast pT1 cpN0 cM0 ER/PR positive HER2 negative   Chief complaint/ Reason for visit-routine follow-up of breast cancer  Heme/Onc history: Patient is a 87 year old female who self palpated a left breast mass in August 2021.  Diagnostic mammogram showed 2 adjacent masses at 4:00 and 6 o'clock position with interspersed calcifications in between spanning 4.5 cm.  Left axillary lymph nodes appeared normal.  Left breast biopsy in both 4 and 6 o'clock position showed invasive mammary carcinoma grade 1   Patient was seen by Dr. Peyton Najjar and underwent left lumpectomy with sentinel lymph node biopsy.  Final pathology showed multifocal invasive mammary carcinoma with high-grade DCIS.  1 sentinel lymph node negative for malignancy.  DCIS also noted in the left retroareolar excisional biopsy.  Tumor size 19 mm grade 1.  Lymphovascular invasion negative.  Margins were negative for invasive carcinoma but positive for DCIS posterior superior.  ER/PR positive and HER2 equivocal by IHC and negative by [place] group 5.  Given the concern for residual multifocal DCIS and positive margin for DCIS she was recommended to undergo mastectomy but chose to hold off on getting it done in September 2021.  She has now met with Dr. Peyton Najjar again and will be undergoing left mastectomy on 06/25/2021.  Oncotype DX testing was offered in September 2021 as well and patient declined.  Patient is not currently on endocrine therapy.   Patient underwentLeft mastectomy by Dr. Peyton Najjar on  06/25/2021 due to concern for residual multifocal DCIS.  Final pathology showed DCIS present at the base of the nipple measuring 0.5 cm.  An isolated focus of DCIS is less than 1.1 cm adjacent to the retroareolar excision site.  Surgical margins uninvolved by DCIS.  Patient did not wish to pursue endocrine therapy  Interval history-patient denies any breast concerns at this time.  She carries out lymphedema prevention exercises at home.  Appetite and weight have remained stable.  Denies any new aches and pains anywhere  ECOG PS- 1 Pain scale- 0   Review of systems- Review of Systems  Constitutional:  Negative for chills, fever, malaise/fatigue and weight loss.  HENT:  Negative for congestion, ear discharge and nosebleeds.   Eyes:  Negative for blurred vision.  Respiratory:  Negative for cough, hemoptysis, sputum production, shortness of breath and wheezing.   Cardiovascular:  Negative for chest pain, palpitations, orthopnea and claudication.  Gastrointestinal:  Negative for abdominal pain, blood in stool, constipation, diarrhea, heartburn, melena, nausea and vomiting.  Genitourinary:  Negative for dysuria, flank pain, frequency, hematuria and urgency.  Musculoskeletal:  Negative for back pain, joint pain and myalgias.  Skin:  Negative for rash.  Neurological:  Negative for dizziness, tingling, focal weakness, seizures, weakness and headaches.  Endo/Heme/Allergies:  Does not bruise/bleed easily.  Psychiatric/Behavioral:  Negative for depression and suicidal ideas. The patient does not have insomnia.       Allergies  Allergen Reactions   Anastrozole Other (See Comments), Itching and Rash    Back pain  Back pain  Back pain    Exemestane Rash    Pain in right hand  Pain in right hand  Pain in right hand    Naproxen Hives    Had bumps break out on the back  Had bumps break out on the back  Had bumps break out on the back  Had bumps break out on the back  Had bumps break out on the  back      Past Medical History:  Diagnosis Date   Basal cell carcinoma (BCC)    txted with MOHs in past   Cellulitis 08/10/2018   Chronic venous insufficiency    with varicose veins   Family history of breast cancer    Family history of thyroid cancer    GERD (gastroesophageal reflux disease)    Glaucoma    History  of basal cell carcinoma    left nare/BREAST CANCER   History of chicken pox    Hypertension    Hypoglycemia    Hypothyroid    Lymphedema    LE   Osteoarthritis    back pain, L knee pain   Osteopenia 06/2009   DEXA 11/2015: T -2.3 hip, -2.2 spine, on longterm bisphosphonate/evista   Psoriasis    Pyogenic granuloma 04/16/2016     Past Surgical History:  Procedure Laterality Date   BASAL CELL CARCINOMA EXCISION     located on face   BREAST BIOPSY Right    core done ing [place]?   BREAST BIOPSY Left 08/16/2020   3 area bx 4:00 X, IMC, 6:00Q IMC, LN hydro #3-benign   BREAST LUMPECTOMY Left 09/11/2020   two areas of [facility]   CATARACT EXTRACTION Bilateral    bilateral   DEXA  06/2009   T score Spine: -1.8, hip -2.0   EXCISION OF BREAST BIOPSY Left 09/11/2020   Procedure: EXCISION OF BREAST BIOPSY;  Surgeon: [name], MD;  Location: [facility] ORS;  Service: General;  Laterality: Left;   Foam sclerotherapy Bilateral 09/2015   [place], [place]   KNEE ARTHROPLASTY Left 05/27/2022   Procedure: COMPUTER ASSISTED TOTAL KNEE ARTHROPLASTY;  Surgeon: Dereck Leep, MD;  Location: [facility] ORS;  Service: Orthopedics;  Laterality: Left;   MASTECTOMY W/ SENTINEL NODE BIOPSY Left 06/25/2021   Procedure: MASTECTOMY WITH SENTINEL LYMPH NODE BIOPSY;  Surgeon: [name], MD;  Location: [facility] ORS;  Service: General;  Laterality: Left;   NASAL RECONSTRUCTION  2005   for [place] L nare s/p Mohs   nuclear stress test  2014   normal in [place] NODE BX Left 09/11/2020   Procedure:  PARTIAL MASTECTOMY WITH NEEDLE LOCALIZATION AND AXILLARY SENTINEL LYMPH NODE [place];  Surgeon: [name], MD;  Location: [facility] ORS;  Service: General;  Laterality: Left;   RADIOFREQUENCY ABLATION Left 01/2012   remnant L G saphenous vein below knee   RADIOFREQUENCY ABLATION Right 02/2013   RLE vein ablation    VEIN LIGATION AND STRIPPING  remote   bilateral G saphenous veins    Social History   Socioeconomic History   Marital status: Married    Spouse name: Not on file   Number of children: 2   Years of education: Not on file   Highest education level: Not on file  Occupational History    Employer: RETIRED  Tobacco Use   Smoking status: Never   Smokeless tobacco: Never  Vaping Use   Vaping Use: Never used  Substance and Sexual Activity   Alcohol use: Yes    Comment: Occasionally drinks wine   Drug use:  No   Sexual activity: Not Currently    Birth control/protection: Post-menopausal  Other Topics Concern   Not on file  Social History Narrative   O+   From [place], [place]   Caffeine: 2-3 cups/day   Lives with husband, majority of time alone as he travels to [place].   Has grandchildren nearby.   Occ: Field seismologist, Metallurgist   Activity: bed exercises, limiting walking   Diet: does get fruits and vegetables, only some water   Social Determinants of Health   Financial Resource Strain: Low Risk  (06/22/2022)   Overall Financial Resource Strain (CARDIA)    Difficulty of Paying Living Expenses: Not hard at all  Food Insecurity: No Food Insecurity (06/22/2022)   Hunger Vital Sign    Worried About Running Out of Food in the Last Year: Never true    Ran Out of Food in the Last Year: Never true  Transportation Needs: No Transportation Needs (06/22/2022)   PRAPARE - Hydrologist (Medical): No    Lack of Transportation (Non-Medical): No  Physical Activity: Sufficiently Active (06/22/2022)   Exercise Vital Sign    Days of Exercise per  Week: 3 days    Minutes of Exercise per Session: 60 min  Stress: No Stress Concern Present (06/22/2022)   [place]    Feeling of Stress : Not at all  Social Connections: Moderately Integrated (06/22/2022)   Social Connection and Isolation Panel [NHANES]    Frequency of Communication with Friends and Family: More than three times a week    Frequency of Social Gatherings with Friends and Family: More than three times a week    Attends Religious Services: 1 to 4 times per year    Active Member of [employer] or Organizations: No    Attends Archivist Meetings: Never    Marital Status: Married  Human resources officer Violence: Not At Risk (06/22/2022)   Humiliation, Afraid, Rape, and Kick questionnaire    Fear of Current or Ex-Partner: No    Emotionally Abused: No    Physically Abused: No    Sexually Abused: No    Family History  Problem Relation Age of Onset   Cancer Mother        thyroid cancer   Heart disease Father        CHF   Diabetes Father    Glaucoma Father    Arthritis Father    Coronary artery disease Maternal Uncle    Bipolar disorder Son    Heart disease Sister        CHF   Stroke Sister    Brain cancer Grandson        astrocytoma   Breast cancer Cousin        dx 65s     Current Outpatient Medications:    alendronate (FOSAMAX) 70 MG tablet, Take 1 tablet (70 mg total) by mouth every Sunday., Disp: 12 tablet, Rfl: 3   aspirin EC 81 MG tablet, Take 81 mg by mouth in the morning and at bedtime. Swallow whole., Disp: , Rfl:    Calcium Carb-Cholecalciferol (CALCIUM 600 + D PO), Take 1 tablet by mouth daily. Ca 500 + Vit D 800, Disp: , Rfl:    celecoxib (CELEBREX) 200 MG capsule, Take 200 mg by mouth daily., Disp: , Rfl:    Cholecalciferol (VITAMIN D3) 25 MCG (1000 UT) CAPS, Take 1 capsule (1,000 Units total) by mouth daily., Disp: 30  capsule, Rfl:    ciclopirox (LOPROX) 0.77 % cream, Apply to feet,  around toenails, between toes once daily for fungus., Disp: 90 g, Rfl: 2   Ciclopirox 0.77 % gel, Apply 1 application  topically 2 (two) times daily as needed (fungus)., Disp: , Rfl:    latanoprost (XALATAN) 0.005 % ophthalmic solution, latanoprost 0.005 % eye drops  PLACE 1 DROP INTO BOTH EYES ONCE EVERY EVENING, Disp: , Rfl:    levothyroxine (SYNTHROID) 75 MCG tablet, Take 1 tablet (75 mcg total) by mouth daily., Disp: 90 tablet, Rfl: 3   losartan (COZAAR) 50 MG tablet, Take 1 tablet (50 mg) by mouth twice daily, Disp: , Rfl:    Magnesium 400 MG TABS, Take 400 mg by mouth daily., Disp: , Rfl:    metoprolol tartrate (LOPRESSOR) 25 MG tablet, Take 25 mg by mouth daily., Disp: , Rfl:    Multiple Vitamin (MULTIVITAMIN) tablet, Take 1 tablet by mouth daily., Disp: , Rfl:    Roflumilast (ZORYVE) 0.3 % CREA, Apply 1 application. topically every other day. Sample, Disp: , Rfl:    SELENIUM PO, Take 1 tablet by mouth daily., Disp: , Rfl:    silver sulfADIAZINE (SILVADENE) 1 % cream, Apply 1 application topically daily., Disp: 50 g, Rfl: 4   tacrolimus (PROTOPIC) 0.1 % ointment, APPLY TOPICALLY TO AFFECTED AREA(S) TWICE DAILY, Disp: 60 g, Rfl: 2   vitamin C (ASCORBIC ACID) 500 MG tablet, Take 500 mg by mouth daily., Disp: , Rfl:    vitamin E 180 MG (400 UNITS) capsule, Take 400 Units by mouth daily., Disp: , Rfl:    diclofenac Sodium (VOLTAREN) 1 % GEL, Apply topically 3 (three) times daily as needed. (Patient not taking: Reported on 10/30/2022), Disp: , Rfl:    oxybutynin (DITROPAN) 5 MG tablet, Take 1 tablet (5 mg total) by mouth at bedtime as needed for bladder spasms. (Patient not taking: Reported on 09/16/2022), Disp: 30 tablet, Rfl: 0  Physical exam:  Vitals:   10/30/22 1511  BP: (!) 152/93  Pulse: (!) 108  Resp: 20  Temp: (!) 96.6 F (35.9 C)  SpO2: 100%  Weight: 167 lb 11.2 oz (76.1 kg)   Physical Exam Constitutional:      General: She is not in acute distress. Cardiovascular:     Rate  and Rhythm: Normal rate and regular rhythm.     Heart sounds: Normal heart sounds.  Pulmonary:     Effort: Pulmonary effort is normal.     Breath sounds: Normal breath sounds.  Abdominal:     General: Bowel sounds are normal.     Palpations: Abdomen is soft.  Skin:    General: Skin is warm and dry.  Neurological:     Mental Status: She is alert and oriented to person, place, and time.   Patient is s/p left mastectomy without reconstruction.  No evidence of chest wall recurrence.  No palpable bilateral axillary adenopathy.  No palpable masses in the right breast.     Latest Ref Rng & Units 06/19/2022    9:12 AM  CMP  Glucose 70 - 99 mg/dL 84   BUN 6 - 23 mg/dL 20   Creatinine 0.40 - 1.20 mg/dL 0.68   Sodium 135 - 145 mEq/L 138   Potassium 3.5 - 5.1 mEq/L 4.6   Chloride 96 - 112 mEq/L 102   CO2 19 - 32 mEq/L 27   Calcium 8.4 - 10.5 mg/dL 9.2   Total Protein 6.0 - 8.3 g/dL 6.5  Total Bilirubin 0.2 - 1.2 mg/dL 0.6   Alkaline Phos 39 - 117 U/L 131   AST 0 - 37 U/L 17   ALT 0 - 35 U/L 15       Latest Ref Rng & Units 06/19/2022    9:12 AM  CBC  WBC 4.0 - 10.5 K/uL 5.9   Hemoglobin 12.0 - 15.0 g/dL 12.2   Hematocrit 36.0 - 46.0 % 37.3   Platelets 150.0 - 400.0 K/uL 342.0      Assessment and plan- Patient is a 87 y.o. female with history of left breast DCIS and stage I invasive mammary carcinoma pT1 cN0 M0 s/p left mastectomy.  She is here for routine follow-up of breast cancer  Clinically patient is doing well with no concerning signs and symptoms of recurrence based on today's exam.  She will be due for right breast mammogram in December 2023 which I will schedule.  I will see her back in 6 months no labs.  Discussed with the patient that surveillance of breast cancer does not include any routine labs or surveillance CT scans.  Scans will be dictated based on any new concerning signs and symptoms   Visit Diagnosis 1. Malignant neoplasm of overlapping sites of left breast in  female, estrogen receptor positive ([place])   2. Visit for screening mammogram      Dr. Randa Evens, MD, MPH [facility] [phone] 10/30/2022 4:12 PM

## 2022-10-30 NOTE — Progress Notes (Signed)
Patient has no concerns at the moment. 

## 2022-11-07 DIAGNOSIS — Z23 Encounter for immunization: Secondary | ICD-10-CM | POA: Diagnosis not present

## 2022-11-09 ENCOUNTER — Ambulatory Visit (INDEPENDENT_AMBULATORY_CARE_PROVIDER_SITE_OTHER): Payer: Medicare Other | Admitting: Vascular Surgery

## 2022-11-09 ENCOUNTER — Encounter (INDEPENDENT_AMBULATORY_CARE_PROVIDER_SITE_OTHER): Payer: Self-pay | Admitting: Vascular Surgery

## 2022-11-09 ENCOUNTER — Ambulatory Visit (INDEPENDENT_AMBULATORY_CARE_PROVIDER_SITE_OTHER): Payer: Medicare Other

## 2022-11-09 VITALS — BP 160/84 | HR 62 | Resp 16 | Wt 171.8 lb

## 2022-11-09 DIAGNOSIS — I872 Venous insufficiency (chronic) (peripheral): Secondary | ICD-10-CM

## 2022-11-09 DIAGNOSIS — M79605 Pain in left leg: Secondary | ICD-10-CM | POA: Diagnosis not present

## 2022-11-09 DIAGNOSIS — M79604 Pain in right leg: Secondary | ICD-10-CM | POA: Diagnosis not present

## 2022-11-09 DIAGNOSIS — I10 Essential (primary) hypertension: Secondary | ICD-10-CM | POA: Diagnosis not present

## 2022-11-09 DIAGNOSIS — I4891 Unspecified atrial fibrillation: Secondary | ICD-10-CM

## 2022-11-09 DIAGNOSIS — I890 Lymphedema, not elsewhere classified: Secondary | ICD-10-CM

## 2022-11-09 DIAGNOSIS — M150 Primary generalized (osteo)arthritis: Secondary | ICD-10-CM

## 2022-11-09 DIAGNOSIS — M159 Polyosteoarthritis, unspecified: Secondary | ICD-10-CM | POA: Diagnosis not present

## 2022-11-09 NOTE — Progress Notes (Signed)
MRN : [id number]  Beth Rodgers is a 87 y.o. (Mar 27, 1935) female who presents with chief complaint of legs hurt and swell.  History of Present Illness:   The patient returns to the office for followup evaluation regarding leg swelling.  The swelling has improved quite a bit and the pain associated with swelling has decreased substantially. There have not been any interval development of a ulcerations or wounds.  Since the previous visit the patient has been wearing graduated compression stockings and has noted some improvement in the lymphedema. The patient has been using compression routinely morning until night.  The patient also states elevation during the day and exercise (such as walking) is being done too.   The patient has a  history of  DJD and is s/p TKR by Dr [name].  No documented LS spine disease.   The patient denies rest pain or dangling of an extremity off the side of the bed during the night for relief. No open wounds or sores at this time. No history of DVT or phlebitis. No prior vascular interventions or surgeries.   ABI's done 08/20/2021 are normal bilaterally.  No outpatient medications have been marked as taking for the 11/09/22 encounter (Appointment) with Delana Meyer, Dolores Lory, MD.    Past Medical History:  Diagnosis Date   Basal cell carcinoma ([place])    txted with MOHs in past   Cellulitis 08/10/2018   Chronic venous insufficiency    with varicose veins   Family history of breast cancer    Family history of thyroid cancer    GERD (gastroesophageal reflux disease)    Glaucoma    History  of basal cell carcinoma    left nare/BREAST CANCER   History of chicken pox    Hypertension    Hypoglycemia    Hypothyroid    Lymphedema    LE   Osteoarthritis    back pain, L knee pain   Osteopenia 06/2009   DEXA 11/2015: T -2.3 hip, -2.2 spine, on longterm bisphosphonate/evista   Psoriasis    Pyogenic granuloma 04/16/2016    Past Surgical History:   Procedure Laterality Date   BASAL CELL CARCINOMA EXCISION     located on face   BREAST BIOPSY Right    core done ing [place]?   BREAST BIOPSY Left 08/16/2020   3 area bx 4:00 X, IMC, 6:00Q IMC, LN hydro #3-benign   BREAST LUMPECTOMY Left 09/11/2020   two areas of [facility]   CATARACT EXTRACTION Bilateral    bilateral   DEXA  06/2009   T score Spine: -1.8, hip -2.0   EXCISION OF BREAST BIOPSY Left 09/11/2020   Procedure: EXCISION OF BREAST BIOPSY;  Surgeon: [name], MD;  Location: [facility] ORS;  Service: General;  Laterality: Left;   Foam sclerotherapy Bilateral 09/2015   Reed [place], [place]   KNEE ARTHROPLASTY Left 05/27/2022   Procedure: COMPUTER ASSISTED TOTAL KNEE ARTHROPLASTY;  Surgeon: Dereck Leep, MD;  Location: [facility] ORS;  Service: Orthopedics;  Laterality: Left;   MASTECTOMY W/ SENTINEL NODE BIOPSY Left 06/25/2021   Procedure: MASTECTOMY WITH SENTINEL LYMPH NODE BIOPSY;  Surgeon: [name], MD;  Location: [facility] ORS;  Service: General;  Laterality: Left;   NASAL RECONSTRUCTION  2005   for [place] L nare s/p Mohs   nuclear stress test  2014   normal in [place] NODE BX Left 09/11/2020  Procedure: PARTIAL MASTECTOMY WITH NEEDLE LOCALIZATION AND AXILLARY SENTINEL LYMPH NODE BX;  Surgeon: [name], MD;  Location: [facility] ORS;  Service: General;  Laterality: Left;   RADIOFREQUENCY ABLATION Left 01/2012   remnant L G saphenous vein below knee   RADIOFREQUENCY ABLATION Right 02/2013   RLE vein ablation    VEIN LIGATION AND STRIPPING  remote   bilateral G saphenous veins    Social History Social History   Tobacco Use   Smoking status: Never   Smokeless tobacco: Never  Vaping Use   Vaping Use: Never used  Substance Use Topics   Alcohol use: Yes    Comment: Occasionally drinks wine   Drug use: No    Family History Family History  Problem Relation Age of Onset    Cancer Mother        thyroid cancer   Heart disease Father        CHF   Diabetes Father    Glaucoma Father    Arthritis Father    Coronary artery disease Maternal Uncle    Bipolar disorder Son    Heart disease Sister        CHF   Stroke Sister    Brain cancer Grandson        astrocytoma   Breast cancer Cousin        dx 79s    Allergies  Allergen Reactions   Anastrozole Other (See Comments), Itching and Rash    Back pain  Back pain  Back pain    Exemestane Rash    Pain in right hand  Pain in right hand  Pain in right hand    Naproxen Hives    Had bumps break out on the back  Had bumps break out on the back  Had bumps break out on the back  Had bumps break out on the back  Had bumps break out on the back      REVIEW OF SYSTEMS (Negative unless checked)  Constitutional: '[]'$ Weight loss  '[]'$ Fever  '[]'$ Chills Cardiac: '[]'$ Chest pain   '[]'$ Chest pressure   '[]'$ Palpitations   '[]'$ Shortness of breath when laying flat   '[]'$ Shortness of breath with exertion. Vascular:  '[]'$ Pain in legs with walking   '[x]'$ Pain in legs at rest  '[]'$ History of DVT   '[]'$ Phlebitis   '[x]'$ Swelling in legs   '[]'$ Varicose veins   '[]'$ Non-healing ulcers Pulmonary:   '[]'$ Uses home oxygen   '[]'$ Productive cough   '[]'$ Hemoptysis   '[]'$ Wheeze  '[]'$ COPD   '[]'$ Asthma Neurologic:  '[]'$ Dizziness   '[]'$ Seizures   '[]'$ History of stroke   '[]'$ History of TIA  '[]'$ Aphasia   '[]'$ Vissual changes   '[]'$ Weakness or numbness in arm   '[]'$ Weakness or numbness in leg Musculoskeletal:   '[]'$ Joint swelling   '[]'$ Joint pain   '[]'$ Low back pain Hematologic:  '[]'$ Easy bruising  '[]'$ Easy bleeding   '[]'$ Hypercoagulable state   '[]'$ Anemic Gastrointestinal:  '[]'$ Diarrhea   '[]'$ Vomiting  '[]'$ Gastroesophageal reflux/heartburn   '[]'$ Difficulty swallowing. Genitourinary:  '[]'$ Chronic kidney disease   '[]'$ Difficult urination  '[]'$ Frequent urination   '[]'$ Blood in urine Skin:  '[]'$ Rashes   '[]'$ Ulcers  Psychological:  '[]'$ History of anxiety   '[]'$  History of major depression.  Physical Examination  There were no vitals filed  for this visit. There is no height or weight on file to calculate BMI. Gen: WD/WN, NAD Head: /AT, No temporalis wasting.  Ear/Nose/Throat: Hearing grossly intact, nares w/o erythema or drainage, pinna without lesions Eyes: PER, EOMI, sclera nonicteric.  Neck: Supple, no gross masses.  No JVD.  Pulmonary:  Good air movement, no audible wheezing, no use of accessory muscles.  Cardiac: RRR, precordium not hyperdynamic. Vascular:  scattered varicosities present bilaterally.  Moderate venous stasis changes to the legs bilaterally.  2+ soft pitting edema  Vessel Right Left  Radial Palpable Palpable  Gastrointestinal: soft, non-distended. No guarding/no peritoneal signs.  Musculoskeletal: M/S 5/5 throughout.  No deformity.  Neurologic: CN 2-12 intact. Pain and light touch intact in extremities.  Symmetrical.  Speech is fluent. Motor exam as listed above. Psychiatric: Judgment intact, Mood & affect appropriate for pt's clinical situation. Dermatologic: Venous rashes no ulcers noted.  No changes consistent with cellulitis. Lymph : No lichenification or skin changes of chronic lymphedema.  CBC Lab Results  Component Value Date   WBC 5.9 06/19/2022   HGB 12.2 06/19/2022   HCT 37.3 06/19/2022   MCV 88.8 06/19/2022   PLT 342.0 06/19/2022    BMET    Component Value Date/Time   NA 138 06/19/2022 0912   K 4.6 06/19/2022 0912   CL 102 06/19/2022 0912   CO2 27 06/19/2022 0912   GLUCOSE 84 06/19/2022 0912   BUN 20 06/19/2022 0912   CREATININE 0.68 06/19/2022 0912   CALCIUM 9.2 06/19/2022 0912   GFRNONAA >60 05/15/2022 1231   GFRAA >60 08/21/2020 1625   CrCl cannot be calculated (Patient's most recent lab result is older than the maximum 21 days allowed.).  COAG No results found for: "INR", "PROTIME"  Radiology No results found.   Assessment/Plan 1. Chronic venous insufficiency Recommend:  No surgery or intervention at this point in time.    I have reviewed my previous  discussion with the patient regarding swelling and why it  causes symptoms.  The patient is doing well with compression and will continue wearing graduated compression on a daily basis. The patient will  continue wearing the compression first thing in the morning and removing them in the evening. The patient is instructed specifically not to sleep in the compression.    In addition, behavioral modification including elevation during the day and exercise as tolerated will be continued.    Patient should follow-up on an annual basis   2. Lymphedema Recommend:  No surgery or intervention at this point in time.    I have reviewed my previous discussion with the patient regarding swelling and why it  causes symptoms.  The patient is doing well with compression and will continue wearing graduated compression on a daily basis. The patient will  continue wearing the compression first thing in the morning and removing them in the evening. The patient is instructed specifically not to sleep in the compression.    In addition, behavioral modification including elevation during the day and exercise as tolerated will be continued.    Patient should follow-up on an annual basis  - VAS [place] LOWER EXTREMITY VENOUS (DVT)  3. Essential hypertension Continue antihypertensive medications as already ordered, these medications have been reviewed and there are no changes at this time.  4. 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  Continue anticoagulation as ordered by Cardiology Service  5. Primary osteoarthritis involving multiple joints Continue NSAID medications as already ordered, these medications have been reviewed and there are no changes at this time.  Continued activity and therapy was stressed.  6. Pain in both lower extremities Recommend:  No surgery or intervention at this  point in time.  I have reviewed my previous discussion with the patient regarding swelling and why it  causes symptoms.  The patient is doing well with compression and will continue wearing graduated compression on a daily basis. The patient will  continue wearing the compression first thing in the morning and removing them in the evening. The patient is instructed specifically not to sleep in the compression.    In addition, behavioral modification including elevation during the day and exercise as tolerated will be continued.    Patient should follow-up on an annual basis  - VAS [place] LOWER EXTREMITY VENOUS (DVT)    Hortencia Pilar, MD  11/09/2022 12:55 PM

## 2022-11-10 ENCOUNTER — Ambulatory Visit (INDEPENDENT_AMBULATORY_CARE_PROVIDER_SITE_OTHER): Payer: Medicare Other | Admitting: Psychology

## 2022-11-10 DIAGNOSIS — F4323 Adjustment disorder with mixed anxiety and depressed mood: Secondary | ICD-10-CM

## 2022-11-10 NOTE — Progress Notes (Unsigned)
[place] Counselor/Therapist Progress Note  Patient ID: Beth Rodgers, MRN: [id number]    Date: 11/10/22  Time Spent: 1:00 pm - 1:52 pm : 52 Minutes  Treatment Type: Individual Therapy.  Reported Symptoms: Rumination, anxiety.    Mental Status Exam: Appearance:  NA     Behavior: Appropriate  Motor: NA  Speech/Language:  Clear and Coherent and Normal Rate  Affect: Congruent  Mood: dysthymic  Thought process: normal  Thought content:   WNL  Sensory/Perceptual disturbances:   WNL  Orientation: oriented to person, place, time/date, and situation  Attention: Good  Concentration: Good  Memory: WNL  Fund of knowledge:  Good  Insight:   Good  Judgment:  Good  Impulse Control: Good   Risk Assessment: Danger to Self:  No Self-injurious Behavior: No  Danger to Others: No Duty to Warn:no Physical Aggression / Violence:No  Access to Firearms a concern: No  Gang Involvement:No   Subjective:   Beth Rodgers participated from home, via phone, and consented to treatment. Therapist participated from office. We met online due to [place] pandemic. Beth Rodgers reviewed the events of the past two weeks. She noted her son's continued incarceration. She noted her worry about how Beth Rodgers will survive once she and her husband pass away. She noted the difficulty managing her boundaries with her son while being supportive. We explored ways to provide support in a meaningful way.    Beth Rodgers  discussed the status of her son's incarceration.  He is currently in jail and is now up regarding possible release.  She noted her hope that he would correct course and begin to make healthy positive decisions for himself and his future.  She discussed often seeing glimmers of positive behavior of this behavior Beth Rodgers have not been consistent.  We explored her feelings regarding this including sadness and disappointment.  We worked on reframing these behaviors and discussed ways to extrapolate data due to his  long history of substance use, unaddressed mood, and legal concerns.  We discussed looking at each individual experience as its own.  Additionally, Beth Rodgers Discussed her own stress regarding her home country's politics and her worry that things might develop.  We processed this during the session.  Therapist validated and normalized Beth Rodgers's experience and feelings and provided supportive therapy.  Beth Rodgers continues to benefit from treatment of follow-up was scheduled.   Interventions: Interpersonal & CBT  Diagnosis: No diagnosis found.  Plan: Patient is to use CBT, BA, Problem-solving, mindfulness and coping skills to help manage decrease symptoms associated with their diagnosis. (Target date: 11/16/22)   Long-term goal:   Reduce overall level, frequency, and intensity of the feelings of depression and anxiety evidenced by   decreased sadness, rumination, lethargy, and interpersonal stressors  from 6 to 7 days/week to 0 to 1 days/week per client report for at least 3 consecutive months.  Short-term goal:  Verbally express understanding of the relationship between feelings of depression, anxiety and their impact on thinking patterns and behaviors.  Verbalize an understanding of the role that distorted thinking plays in creating fears, excessive worry, and ruminations.  Engage in enjoyable activities on a consistent basis.  [name], LCSW

## 2022-11-11 ENCOUNTER — Telehealth: Payer: Self-pay | Admitting: Cardiovascular Disease

## 2022-11-11 MED ORDER — LOSARTAN POTASSIUM 50 MG PO TABS: 50.0000 mg | ORAL_TABLET | Freq: Two times a day (BID) | ORAL | 5 refills | Status: DC

## 2022-11-11 NOTE — Telephone Encounter (Signed)
*  STAT* If patient is at the pharmacy, call can be transferred to refill team.   1. Which medications need to be refilled? (please list name of each medication and dose if known)   losartan (COZAAR) 50 MG tablet   2. Which pharmacy/location (including street and city if local pharmacy) is medication to be sent to?  [id number]- [place] [place] [address]   3. Do they need a 30 day or 90 day supply?  90 day  Patient stated she is completely out of this medication.

## 2022-11-11 NOTE — Telephone Encounter (Signed)
Called patient and informed her that I sent her refill in as requested. Patient was grateful for the follow up.

## 2022-11-15 ENCOUNTER — Encounter (INDEPENDENT_AMBULATORY_CARE_PROVIDER_SITE_OTHER): Payer: Self-pay | Admitting: Vascular Surgery

## 2022-11-24 ENCOUNTER — Ambulatory Visit (INDEPENDENT_AMBULATORY_CARE_PROVIDER_SITE_OTHER): Payer: Medicare Other | Admitting: Psychology

## 2022-11-24 DIAGNOSIS — F4323 Adjustment disorder with mixed anxiety and depressed mood: Secondary | ICD-10-CM | POA: Diagnosis not present

## 2022-11-24 NOTE — Progress Notes (Signed)
[facility] Counselor/Therapist Progress Note  Patient ID: Beth Rodgers, MRN: [id number]    Date: 11/24/2022  Time Spent: 1:07 pm - 2:01 pm : 54 Minutes  Treatment Type: Individual Therapy.  Reported Symptoms: Rumination, anxiety.    Mental Status Exam: Appearance:  NA     Behavior: Appropriate  Motor: NA  Speech/Language:  Clear and Coherent and Normal Rate  Affect: Congruent  Mood: dysthymic  Thought process: normal  Thought content:   WNL  Sensory/Perceptual disturbances:   WNL  Orientation: oriented to person, place, time/date, and situation  Attention: Good  Concentration: Good  Memory: WNL  Fund of knowledge:  Good  Insight:   Good  Judgment:  Good  Impulse Control: Good   Risk Assessment: Danger to Self:  No Self-injurious Behavior: No  Danger to Others: No Duty to Warn:no Physical Aggression / Violence:No  Access to Firearms a concern: No  Gang Involvement:No   Subjective:   [name] participated from home, via phone, and consented to treatment. Therapist participated from home office. We met online due to [place] pandemic. [name] reviewed the events of the past two weeks. She noted her effort to engage in enjoyable activities and to maintain She noted regarding her sister's declining short-term memory. She noted her efforts to build positive rapport with her son who continues to be incarcerated. She noted effort to support positive decision-making from [name]. She noted difficulty with acceptance that [name] has not met his potential. We worked on processing this and her attempts to affect his behavior. Therapist provided effort towards acceptance of [name]'s mental health and lack of treatment in addition to moments of meeting his potential via writing projects and artwork and appreciation for those moments. Therapist validated and normalized [name]'s feelings and experience and provided supportive therapy.    Interventions: Interpersonal &  CBT  Diagnosis: Adjustment disorder with mixed anxiety and depressed mood  Plan: Patient is to use CBT, BA, Problem-solving, mindfulness and coping skills to help manage decrease symptoms associated with their diagnosis. (Target date: 12/16/22)   Long-term goal:   Reduce overall level, frequency, and intensity of the feelings of depression and anxiety evidenced by   decreased sadness, rumination, lethargy, and interpersonal stressors  from 6 to 7 days/week to 0 to 1 days/week per client report for at least 3 consecutive months.  Short-term goal:  Verbally express understanding of the relationship between feelings of depression, anxiety and their impact on thinking patterns and behaviors.  Verbalize an understanding of the role that distorted thinking plays in creating fears, excessive worry, and ruminations.  Engage in enjoyable activities on a consistent basis.  [name], LCSW

## 2022-12-04 ENCOUNTER — Ambulatory Visit: Payer: Medicare Other | Admitting: Family Medicine

## 2022-12-07 ENCOUNTER — Ambulatory Visit
Admission: RE | Admit: 2022-12-07 | Discharge: 2022-12-07 | Disposition: A | Discharge status: Home / Self Care | Payer: Medicare Other | Source: Ambulatory Visit | Attending: Oncology | Admitting: Oncology

## 2022-12-07 DIAGNOSIS — Z1231 Encounter for screening mammogram for malignant neoplasm of breast: Secondary | ICD-10-CM | POA: Insufficient documentation

## 2022-12-08 ENCOUNTER — Ambulatory Visit (INDEPENDENT_AMBULATORY_CARE_PROVIDER_SITE_OTHER): Payer: Medicare Other | Admitting: Psychology

## 2022-12-08 DIAGNOSIS — F4323 Adjustment disorder with mixed anxiety and depressed mood: Secondary | ICD-10-CM

## 2022-12-08 NOTE — Progress Notes (Signed)
[facility] Counselor/Therapist Progress Note  Patient ID: Beth Rodgers, MRN: [id number]    Date: 12/08/2022  Time Spent: 1:02 pm - 1:50  pm : 48 Minutes  Treatment Type: Individual Therapy.  Reported Symptoms: Rumination, anxiety.    Mental Status Exam: Appearance:  NA     Behavior: Appropriate  Motor: NA  Speech/Language:  Clear and Coherent and Normal Rate  Affect: Congruent  Mood: dysthymic  Thought process: normal  Thought content:   WNL  Sensory/Perceptual disturbances:   WNL  Orientation: oriented to person, place, time/date, and situation  Attention: Good  Concentration: Good  Memory: WNL  Fund of knowledge:  Good  Insight:   Good  Judgment:  Good  Impulse Control: Good   Risk Assessment: Danger to Self:  No Self-injurious Behavior: No  Danger to Others: No Duty to Warn:no Physical Aggression / Violence:No  Access to Firearms a concern: No  Gang Involvement:No   Subjective:   Beth Rodgers participated from home, via phone, and consented to treatment. Therapist participated from office. We met online due to [place] pandemic. Beth Rodgers reviewed the events of the past two weeks. She noted some improvement in Beth Rodgers's mood due to his continued incarceration. She noted some rise in frustration regarding some of her husband's continual apologizing. She noted feeling guilt for her response. We explored and processed this during the session. Therapist discussed radical acceptance as a method of addressing her concerns during similar situations. Therapist provided handout, via email, for reference and review. We worked on distress tolerance, reframing, and communication.  Therapist validated and normalized Beth Rodgers's feelings and experience and provided supportive therapy.    Interventions: Interpersonal & CBT  Diagnosis: Adjustment disorder with mixed anxiety and depressed mood  Plan: Patient is to use CBT, BA, Problem-solving, mindfulness and coping skills to help  manage decrease symptoms associated with their diagnosis. (Target date: 12/16/22)   Long-term goal:   Reduce overall level, frequency, and intensity of the feelings of depression and anxiety evidenced by   decreased sadness, rumination, lethargy, and interpersonal stressors  from 6 to 7 days/week to 0 to 1 days/week per client report for at least 3 consecutive months.  Short-term goal:  Verbally express understanding of the relationship between feelings of depression, anxiety and their impact on thinking patterns and behaviors.  Verbalize an understanding of the role that distorted thinking plays in creating fears, excessive worry, and ruminations.  Engage in enjoyable activities on a consistent basis.  [name], LCSW

## 2022-12-14 ENCOUNTER — Encounter: Payer: Self-pay | Admitting: Family Medicine

## 2022-12-14 ENCOUNTER — Ambulatory Visit (INDEPENDENT_AMBULATORY_CARE_PROVIDER_SITE_OTHER): Payer: Medicare Other | Admitting: Family Medicine

## 2022-12-14 VITALS — BP 128/72 | HR 79 | Temp 97.8°F | Ht 66.0 in | Wt 175.0 lb

## 2022-12-14 DIAGNOSIS — I872 Venous insufficiency (chronic) (peripheral): Secondary | ICD-10-CM

## 2022-12-14 DIAGNOSIS — C50812 Malignant neoplasm of overlapping sites of left female breast: Secondary | ICD-10-CM

## 2022-12-14 DIAGNOSIS — F4321 Adjustment disorder with depressed mood: Secondary | ICD-10-CM

## 2022-12-14 DIAGNOSIS — Z170 Estrogen receptor positive status [ER+]: Secondary | ICD-10-CM

## 2022-12-14 DIAGNOSIS — I10 Essential (primary) hypertension: Secondary | ICD-10-CM

## 2022-12-14 DIAGNOSIS — E663 Overweight: Secondary | ICD-10-CM | POA: Diagnosis not present

## 2022-12-14 NOTE — Assessment & Plan Note (Signed)
Appreciate oncology and gen surgery care.

## 2022-12-14 NOTE — Patient Instructions (Signed)
Esta muy bien hoy! A seguir medicamentos actuales.  Gusto verla hoy - regresar en 6 meses para proximo examen fisico.

## 2022-12-14 NOTE — Assessment & Plan Note (Signed)
Chronic, stable, improved on current regimen - continue.

## 2022-12-14 NOTE — Progress Notes (Signed)
Patient ID: Beth Rodgers, female    DOB: 20-Jun-1935, 87 y.o.   MRN: [id number]  This visit was conducted in person.  BP 128/72   Pulse 79   Temp 97.8 F (36.6 C) (Temporal)   Ht '5\' 6"'$  (1.676 m)   Wt 175 lb (79.4 kg)   SpO2 97%   BMI 28.25 kg/m    CC: 6 mo f/u visit  Subjective:   HPI: Beth Rodgers is a 87 y.o. female presenting on 12/14/2022 for Follow-up (Here for 6 mo f/u.)   Stressed as her 40yo bipolar son is currently back home (4 days ago) Has decide to postpone trip to [place].   Left breast cancer s/p L mastectomy followed by surgeon Windell Moment) and oncologist [name] Banks), last saw onc 10/2022 with good report.   HTN - BP meds recently titrated by cardiology with good effect. She continues losartan '50mg'$  bid and metoprolol 12.'5mg'$  bid. H/o lone afib, off anticoagulation besides aspirin '81mg'$  bid.   Continues seeing counselor Alene Mires Q2 wks, on she is not interested in medications.   Urinary urge incontinence - did not try oxybutynin. No dysuria or discomfort. She is drinking plenty of water, with nocturia x3-4 but this is not too bothersome.      Relevant past medical, surgical, family and social history reviewed and updated as indicated. Interim medical history since our last visit reviewed. Allergies and medications reviewed and updated. Outpatient Medications Prior to Visit  Medication Sig Dispense Refill   alendronate (FOSAMAX) 70 MG tablet Take 1 tablet (70 mg total) by mouth every Sunday. 12 tablet 3   aspirin EC 81 MG tablet Take 81 mg by mouth in the morning and at bedtime. Swallow whole.     Calcium Carb-Cholecalciferol (CALCIUM 600 + D PO) Take 1 tablet by mouth daily. Ca 500 + Vit D 800     celecoxib (CELEBREX) 200 MG capsule Take 200 mg by mouth daily.     Cholecalciferol (VITAMIN D3) 25 MCG (1000 UT) CAPS Take 1 capsule (1,000 Units total) by mouth daily. 30 capsule    ciclopirox (LOPROX) 0.77 % cream Apply to feet, around toenails, between toes once daily  for fungus. 90 g 2   Ciclopirox 0.77 % gel Apply 1 application  topically 2 (two) times daily as needed (fungus).     diclofenac Sodium (VOLTAREN) 1 % GEL Apply topically 3 (three) times daily as needed.     latanoprost (XALATAN) 0.005 % ophthalmic solution latanoprost 0.005 % eye drops  PLACE 1 DROP INTO BOTH EYES ONCE EVERY EVENING     levothyroxine (SYNTHROID) 75 MCG tablet Take 1 tablet (75 mcg total) by mouth daily. 90 tablet 3   losartan (COZAAR) 50 MG tablet Take 1 tablet (50 mg total) by mouth in the morning and at bedtime. 60 tablet 5   Magnesium 400 MG TABS Take 400 mg by mouth daily.     metoprolol tartrate (LOPRESSOR) 25 MG tablet Take 25 mg by mouth daily.     Multiple Vitamin (MULTIVITAMIN) tablet Take 1 tablet by mouth daily.     Roflumilast (ZORYVE) 0.3 % CREA Apply 1 application. topically every other day. Sample     SELENIUM PO Take 1 tablet by mouth daily.     silver sulfADIAZINE (SILVADENE) 1 % cream Apply 1 application topically daily. 50 g 4   tacrolimus (PROTOPIC) 0.1 % ointment APPLY TOPICALLY TO AFFECTED AREA(S) TWICE DAILY 60 g 2   vitamin C (ASCORBIC ACID) 500 MG  tablet Take 500 mg by mouth daily.     vitamin E 180 MG (400 UNITS) capsule Take 400 Units by mouth daily.     oxybutynin (DITROPAN) 5 MG tablet Take 1 tablet (5 mg total) by mouth at bedtime as needed for bladder spasms. (Patient not taking: Reported on 09/16/2022) 30 tablet 0   No facility-administered medications prior to visit.     Per HPI unless specifically indicated in ROS section below Review of Systems  Objective:  BP 128/72   Pulse 79   Temp 97.8 F (36.6 C) (Temporal)   Ht '5\' 6"'$  (1.676 m)   Wt 175 lb (79.4 kg)   SpO2 97%   BMI 28.25 kg/m   Wt Readings from Last 3 Encounters:  12/14/22 175 lb (79.4 kg)  11/09/22 171 lb 12.8 oz (77.9 kg)  10/30/22 167 lb 11.2 oz (76.1 kg)      Physical Exam Vitals and nursing note reviewed.  Constitutional:      Appearance: Normal appearance. She  is not ill-appearing.     Comments: Ambulates with cane  HENT:     Head: Normocephalic and atraumatic.     Mouth/Throat:     Mouth: Mucous membranes are moist.     Pharynx: Oropharynx is clear. No oropharyngeal exudate or posterior oropharyngeal erythema.  Eyes:     Extraocular Movements: Extraocular movements intact.     Pupils: Pupils are equal, round, and reactive to light.  Cardiovascular:     Rate and Rhythm: Normal rate and regular rhythm.     Pulses: Normal pulses.     Heart sounds: Normal heart sounds. No murmur heard. Pulmonary:     Effort: Pulmonary effort is normal. No respiratory distress.     Breath sounds: Normal breath sounds. No wheezing, rhonchi or rales.  Musculoskeletal:        General: Tenderness present.     Right lower leg: No edema.     Left lower leg: No edema.     Comments: Wearing compression stockings  Skin:    General: Skin is warm and dry.  Neurological:     Mental Status: She is alert.  Psychiatric:        Mood and Affect: Mood normal.        Behavior: Behavior normal.       Results for orders placed or performed in visit on 06/19/22  VITAMIN D 25 Hydroxy (Vit-D Deficiency, Fractures)  Result Value Ref Range   VITD 39.84 30.00 - 100.00 ng/mL  Hemoglobin A1c  Result Value Ref Range   Hgb A1c MFr Bld 5.4 4.6 - 6.5 %  TSH  Result Value Ref Range   TSH 4.68 0.35 - 5.50 uIU/mL  CBC with Differential/Platelet  Result Value Ref Range   WBC 5.9 4.0 - 10.5 K/uL   RBC 4.20 3.87 - 5.11 Mil/uL   Hemoglobin 12.2 12.0 - 15.0 g/dL   HCT 37.3 36.0 - 46.0 %   MCV 88.8 78.0 - 100.0 fl   MCHC 32.7 30.0 - 36.0 g/dL   RDW 15.5 11.5 - 15.5 %   Platelets 342.0 150.0 - 400.0 K/uL   Neutrophils Relative % 59.4 43.0 - 77.0 %   Lymphocytes Relative 21.3 12.0 - 46.0 %   Monocytes Relative 12.0 3.0 - 12.0 %   Eosinophils Relative 6.4 (H) 0.0 - 5.0 %   Basophils Relative 0.9 0.0 - 3.0 %   Neutro Abs 3.5 1.4 - 7.7 K/uL   Lymphs Abs 1.3 0.7 -  4.0 K/uL    Monocytes Absolute 0.7 0.1 - 1.0 K/uL   Eosinophils Absolute 0.4 0.0 - 0.7 K/uL   Basophils Absolute 0.1 0.0 - 0.1 K/uL  Comprehensive metabolic panel  Result Value Ref Range   Sodium 138 135 - 145 mEq/L   Potassium 4.6 3.5 - 5.1 mEq/L   Chloride 102 96 - 112 mEq/L   CO2 27 19 - 32 mEq/L   Glucose, Bld 84 70 - 99 mg/dL   BUN 20 6 - 23 mg/dL   Creatinine, Ser 0.68 0.40 - 1.20 mg/dL   Total Bilirubin 0.6 0.2 - 1.2 mg/dL   Alkaline Phosphatase 131 (H) 39 - 117 U/L   AST 17 0 - 37 U/L   ALT 15 0 - 35 U/L   Total Protein 6.5 6.0 - 8.3 g/dL   Albumin 3.9 3.5 - 5.2 g/dL   GFR 79.59 >60.00 mL/min   Calcium 9.2 8.4 - 10.5 mg/dL    Assessment & Plan:   Problem List Items Addressed This Visit     Chronic venous insufficiency    Regularly sees [facility], latest evaluation stable.       Essential hypertension - Primary    Chronic, stable, improved on current regimen - continue.       Situational depression    Continues seeing Tourist information centre manager with benefit.  Declines medication for this.       Breast cancer, left breast [facility])    Appreciate oncology and gen surgery care.       Overweight with body mass index (BMI) 25.0-29.9    Maintaining weight loss noted.         No orders of the defined types were placed in this encounter.  No orders of the defined types were placed in this encounter.    Patient Instructions  Esta Stevie Kern hoy! A seguir medicamentos actuales.  Gusto verla hoy - regresar en 6 meses para proximo examen fisico.   Follow up plan: Return in about 6 months (around 06/15/2023) for medicare wellness visit.  Ria Bush, MD

## 2022-12-14 NOTE — Assessment & Plan Note (Signed)
Regularly sees [facility], latest evaluation stable.

## 2022-12-14 NOTE — Assessment & Plan Note (Signed)
Continues seeing Tourist information centre manager with benefit.  Declines medication for this.

## 2022-12-14 NOTE — Assessment & Plan Note (Signed)
Maintaining weight loss noted.

## 2022-12-23 ENCOUNTER — Telehealth: Payer: Self-pay | Admitting: Family Medicine

## 2022-12-23 ENCOUNTER — Telehealth: Payer: Self-pay | Admitting: Cardiovascular Disease

## 2022-12-23 ENCOUNTER — Telehealth: Payer: Self-pay | Admitting: *Deleted

## 2022-12-23 DIAGNOSIS — N950 Postmenopausal bleeding: Secondary | ICD-10-CM

## 2022-12-23 NOTE — Telephone Encounter (Addendum)
I spoke with pt;pt said she just got off phone with access nurse.pt said earlier this morning pt went to bathroom and wiped and toilet tissue full of bright red blood; for 1 hr a lot of bright red blood kept coming. Just now pt went to bathroom and bleeding has lessened; pt said no known injury and no sexual encounters. pt said access nurse advised pt to get appt with GYN. Pt is sure blood is from vagina and not rectum. Pt said bleeding has slowed down a lot right now. I spoke with Dr Darnell Level and he said he will put in GYN referral but is not sure how long will take to get appt with GYN and to schedule pt with [facility] provider at [facility]. I went back to phone and pt said right now she is not having any bleeding. I spoke with Dr Beth Rodgers who has appt on 12/24/22 at 9:40 and Dr Beth Rodgers advised he would see pt and for pt to stop ASA 81 mg until bleeding stops for at least 24 hours. Pt said last ASA 81 mg was taken last night at midnight and pt voiced  understanding. Pt scheduled appt with Dr Beth Rodgers on 12/24/22 at 9:40 with ED precautions and pt voiced understanding and pt is also appreciative of GYN referral. Sending note to Dr Darnell Level for GYN referral; and to Dr Beth Rodgers and Beth Rodgers. Pt notified as instructed in Dr Beth Rodgers note and voiced understanding.   Beth Rodgers - Client TELEPHONE ADVICE RECORD Access[facility]urse Patient Name: Beth Rodgers Gender: Female DOB: 05-02-1935 Age: 87 Y 36 M 10 D Return Phone Number: [phone] (Primary) Address: City/ State/ Zip: [place]  [address] Client [place] Rodgers - Client Client Site [place] Provider Beth Rodgers - MD Contact Type Call Who Is Calling Patient / Member / Family / Caregiver Call Type Triage / Clinical Relationship To Patient Self Return Phone Number [phone] (Primary) Chief Complaint Vaginal Bleeding Reason for Call Symptomatic / Request for [place] states she is having vaginal bleeding. She is 87 years old. Translation No Nurse Assessment Nurse: Beth Ramp, RN, Beth Rodgers Date/Time (Eastern Time): 12/23/2022 11:46:56 AM Confirm and document reason for call. If symptomatic, describe symptoms. ---Caller states she is having constant vaginal bleeding for the last hr. Does the patient have any new or worsening symptoms? ---Yes Will a triage be completed? ---Yes Related visit to physician within the last 2 weeks? ---No Does the PT have any chronic conditions? (i.e. diabetes, asthma, this includes High risk factors for pregnancy, etc.) ---Yes List chronic conditions. ---Mastectomy last year- stage 1 breast cancer, Heart rhythm, Glucoma Is this a behavioral health or substance abuse call? ---No Guidelines Guideline Title Affirmed Question Affirmed Notes Nurse Date/Time Beth Rodgers Time) Vaginal Bleeding - Postmenopausal Postmenopausal vaginal bleeding Beth Ramp, RN, Beth Rodgers 12/23/2022 11:49:01 AM Disp. Time Beth Rodgers Time) Disposition Final User 12/23/2022 11:27:24 AM Attempt made - message left Beth Ramp RN, Beth Rodgers 12/23/2022 12:01:01 PM See PCP within 2 Weeks Yes Beth Ramp, RN, Beth Rodgers Final Disposition 12/23/2022 12:01:01 PM See PCP within 2 Weeks Yes Beth Ramp, RN, Beth Rodgers PLEASE NOTE: All timestamps contained within this report are represented as [place] Standard Time. CONFIDENTIALTY NOTICE: This fax transmission is intended only for the addressee. It contains information that is legally privileged, confidential or otherwise protected from use or disclosure. If you are not the intended recipient, you are strictly prohibited from reviewing, disclosing, copying using or  disseminating any of this information or taking any action in reliance on or regarding this information. If you have received this fax in error, please notify us immediately by telephone so that we can arrange for its return to [place]. Phone:  [phone], Toll-Free: [phone], Fax: [phone] Page: 2 of 2 Call Id: [id number] [place] Disagree/Comply Comply Caller Understands Yes PreDisposition Call Doctor Care Advice Given Per Guideline SEE PCP WITHIN 2 WEEKS: * You need to be seen for this ongoing problem within the next 2 weeks. * PCP VISIT: Call your doctor (or NP/PA) during regular office hours and make an appointment. DIARY: * Keep a record for your doctor (or NP/PA) of the days you have any bleeding or spotting. * Call your doctor during office hours. CALL BACK IF: * Severe abdomen pain or dizziness occurs * Bleeding increases * You become worse CARE ADVICE given per Vaginal Bleeding, Postmenopausal (Adult) guideline. Comments User: Beth Humbles, RN Date/Time Beth Rodgers Time): 12/23/2022 11:55:30 AM The bleeding has slowed in the last 10 mi

## 2022-12-23 NOTE — Telephone Encounter (Signed)
Left message to call back  

## 2022-12-23 NOTE — Telephone Encounter (Signed)
Patient called in stating that she's experiencing bleeding as if she's having a period. I sent her to access nurse to be triaged.

## 2022-12-23 NOTE — Telephone Encounter (Signed)
Talk to pcp or gyn

## 2022-12-23 NOTE — Telephone Encounter (Signed)
Pt c/o medication issue:  1. Name of Medication:  aspirin EC 81 MG tablet  2. How are you currently taking this medication (dosage and times per day)?  Twice daily by mouth--and 12:00 AM and 12:00 PM  3. Are you having a reaction (difficulty breathing--STAT)?   4. What is your medication issue?   Patient states about 30 minutes ago she noticed that she has had continuous abnormal bleeding as if she is having a menstrual cycle. She would like to know if she should avoid taking Aspirin for the time being. Please advise.

## 2022-12-23 NOTE — Telephone Encounter (Signed)
Needs exam.  Plz offer next available provider.  I've placed GYN referral as well.  If ongoing bleed, recommend urgent evaluation at [facility] or ER depending on amount of bleeding.   Last saw [facility] GYN 11/2015

## 2022-12-23 NOTE — Telephone Encounter (Signed)
Pt called in requesting a call back stated she has a discharge with urination . Please advise [phone]

## 2022-12-23 NOTE — Telephone Encounter (Signed)
Noted. Thank you. Appreciate Dr C seeing this pleasant patient.

## 2022-12-23 NOTE — Telephone Encounter (Signed)
Patient called reporting that she is having deep red colored vaginal bleeding which started this morning. It is not constant. She is asking what she needs to do about it. Please advise

## 2022-12-24 ENCOUNTER — Ambulatory Visit (INDEPENDENT_AMBULATORY_CARE_PROVIDER_SITE_OTHER): Payer: Medicare Other | Admitting: Family Medicine

## 2022-12-24 ENCOUNTER — Encounter: Payer: Self-pay | Admitting: Family Medicine

## 2022-12-24 VITALS — BP 110/68 | HR 87 | Temp 98.1°F | Ht 66.0 in | Wt 172.4 lb

## 2022-12-24 DIAGNOSIS — N950 Postmenopausal bleeding: Secondary | ICD-10-CM | POA: Diagnosis not present

## 2022-12-24 DIAGNOSIS — M5030 Other cervical disc degeneration, unspecified cervical region: Secondary | ICD-10-CM | POA: Diagnosis not present

## 2022-12-24 DIAGNOSIS — M47812 Spondylosis without myelopathy or radiculopathy, cervical region: Secondary | ICD-10-CM

## 2022-12-24 DIAGNOSIS — G8929 Other chronic pain: Secondary | ICD-10-CM | POA: Diagnosis not present

## 2022-12-24 NOTE — Telephone Encounter (Signed)
Returned call to pt. She report she has since found the source of bleeding and symptoms has resolved. Pt stated she has an appointment with pcp today for follow up. She report she spoke to pcp via phone yesterday who recommending stopping ASA for the time being. Pt will resume medication at the discretion of pcp.

## 2022-12-24 NOTE — Progress Notes (Signed)
Romir Klimowicz T. Alisia Vanengen, MD, [place] at [facility] [place], [address]  Phone: [phone]  FAX: [phone]  Beth Rodgers - 87 y.o. female  MRN [id number]  Date of Birth: [birth year]  Date: 12/24/2022  PCP: Ria Bush, MD  Referral: Ria Bush, MD  Chief Complaint  Patient presents with   Neck Pain    Radiates down to Shoulders   Subjective:   Beth Rodgers is a 87 y.o. very pleasant female patient with Body mass index is 27.82 kg/m. who presents with the following:  Neck pain that radiates to her shoulder.   Has an [place] and GYN appointment on 12/31/2021.  She did have some vaginal bleeding yesterday, and this is completely stopped at this point.  She does not particularly want to talk to me about this and would rather talk to me about her neck pain today.  She denied and did not want me to examine her.  She is having some relatively diffuse pain in the neck, and she has had some restriction in her motion has been present for months.  WHen she wakes up, it is not hurting, then will have some pain in the neck.  Looks at her phone a lot.  Pain on the posterior of her neck.  Feels like her bones are hurting a lot.   Went to a lot of therapy last year, but is not sure about what she is doing - was taught some McKenzie  Doing some neck and UE rehab most days.  45 minutes each morning, some type of exercise.  -I reviewed with her how to do McKenzie protocol again, and I slightly modified some of her range of motion exercises she is doing for her neck.  Severe DDD in neck - on CT -Did independently review of soft tissue neck CT that she had, and she has multilevel degenerative disc disease with severe in character degenerative changes and spondyloarthropathy throughout the neck.  Worried about using Voltaren gel - I encouraged her to use it.   Review of Systems is noted in the HPI, as appropriate  Objective:    BP 110/68   Pulse 87   Temp 98.1 F (36.7 C) (Oral)   Ht '5\' 6"'$  (1.676 m)   Wt 172 lb 6 oz (78.2 kg)   SpO2 98%   BMI 27.82 kg/m   GEN: No acute distress; alert,appropriate. PULM: Breathing comfortably in no respiratory distress PSYCH: Normally interactive.    CERVICAL SPINE EXAM Range of motion: Flexion, extension, lateral bending, and rotation: Roughly 25 to 30% loss of motion in all directions Pain with terminal motion: Yes Spinous Processes: NT SCM: NT Upper paracervical muscles: Tender Upper traps: NT C5-T1 intact, sensation and motor   Laboratory and Imaging Data: CT of the neck reviewed above  Assessment and Plan:     ICD-10-CM   1. DDD (degenerative disc disease), cervical  M50.30     2. Postmenopausal vaginal bleeding  N95.0     3. Chronic neck pain with osteoarthritis determined by x-ray  M47.812    G89.29      Total encounter time: 30 minutes. This includes total time spent on the day of encounter.  Additional time spent on chart review in relation to her vaginal bleeding.  Additional time spent on independent review of CT of the neck, bone windows.  Bleeding is stopped.  She has an appropriate gynecological follow-up and ultrasound in less than  a week.  Multilevel degenerative disc disease of the cervical spine, with some chronic neck pain.  This is going to be a challenge off and on indefinitely likely.  She is already taking some Celebrex daily, and I encouraged her to use her Voltaren gel.  She was somewhat anxious about using it, but this is quite safe and only 5% gets in the bloodstream in general.  I would hold off on any kind of sedating medications at this point given age and fall risk.  She is getting continue with some McKenzie protocol to style rehab 4 days a week.  Disposition: No follow-ups on file.  [employer] One speech-to-text software was used for transcription in this dictation.  Possible transcriptional errors can occur using Radio producer.   Signed,  Maud Deed. Demetrus Pavao, MD   Outpatient Encounter Medications as of 12/24/2022  Medication Sig   alendronate (FOSAMAX) 70 MG tablet Take 1 tablet (70 mg total) by mouth every Sunday.   aspirin EC 81 MG tablet Take 81 mg by mouth in the morning and at bedtime. Swallow whole.   Calcium Carb-Cholecalciferol (CALCIUM 600 + D PO) Take 1 tablet by mouth daily. Ca 500 + Vit D 800   celecoxib (CELEBREX) 200 MG capsule Take 200 mg by mouth daily.   Cholecalciferol (VITAMIN D3) 25 MCG (1000 UT) CAPS Take 1 capsule (1,000 Units total) by mouth daily.   ciclopirox (LOPROX) 0.77 % cream Apply to feet, around toenails, between toes once daily for fungus.   Ciclopirox 0.77 % gel Apply 1 application  topically 2 (two) times daily as needed (fungus).   diclofenac Sodium (VOLTAREN) 1 % GEL Apply topically 3 (three) times daily as needed.   latanoprost (XALATAN) 0.005 % ophthalmic solution latanoprost 0.005 % eye drops  PLACE 1 DROP INTO BOTH EYES ONCE EVERY EVENING   levothyroxine (SYNTHROID) 75 MCG tablet Take 1 tablet (75 mcg total) by mouth daily.   losartan (COZAAR) 50 MG tablet Take 1 tablet (50 mg total) by mouth in the morning and at bedtime.   Magnesium 400 MG TABS Take 400 mg by mouth daily.   metoprolol tartrate (LOPRESSOR) 25 MG tablet Take 25 mg by mouth daily.   Multiple Vitamin (MULTIVITAMIN) tablet Take 1 tablet by mouth daily.   Roflumilast (ZORYVE) 0.3 % CREA Apply 1 application. topically every other day. Sample   SELENIUM PO Take 1 tablet by mouth daily.   silver sulfADIAZINE (SILVADENE) 1 % cream Apply 1 application topically daily.   tacrolimus (PROTOPIC) 0.1 % ointment APPLY TOPICALLY TO AFFECTED AREA(S) TWICE DAILY   vitamin C (ASCORBIC ACID) 500 MG tablet Take 500 mg by mouth daily.   vitamin E 180 MG (400 UNITS) capsule Take 400 Units by mouth daily.   No facility-administered encounter medications on file as of 12/24/2022.

## 2022-12-25 ENCOUNTER — Other Ambulatory Visit: Payer: Self-pay | Admitting: Obstetrics & Gynecology

## 2022-12-25 DIAGNOSIS — N950 Postmenopausal bleeding: Secondary | ICD-10-CM

## 2022-12-31 ENCOUNTER — Telehealth: Payer: Self-pay | Admitting: Obstetrics & Gynecology

## 2022-12-31 ENCOUNTER — Encounter: Payer: Medicare Other | Admitting: Obstetrics & Gynecology

## 2022-12-31 ENCOUNTER — Other Ambulatory Visit: Payer: Medicare Other

## 2022-12-31 ENCOUNTER — Telehealth: Payer: Self-pay | Admitting: Family Medicine

## 2022-12-31 NOTE — Telephone Encounter (Signed)
Spoke with pt relaying Dr. G's message. Pt verbalizes understanding.  

## 2022-12-31 NOTE — Telephone Encounter (Signed)
I contacted patient via phone. We have no ultrasound tech this AM.I advised patient to report for her appointment scheduled for 10:55 am with Dr Hulan Fray. We will get her ultrasound rescheduled while she is in office.

## 2022-12-31 NOTE — Telephone Encounter (Signed)
Looks like appt was rescheduled for tomorrow.  Recommend she keep that appt. Thanks.

## 2022-12-31 NOTE — Telephone Encounter (Signed)
Pt called in requesting a call back stated her appointment was cancel with the OBGYN unable to reschedule was told she needs a new referral . Stated she no longer have the symptoms wants to know if she still need to have the test done . Please advise [phone]

## 2023-01-01 ENCOUNTER — Other Ambulatory Visit: Payer: Self-pay | Admitting: Obstetrics & Gynecology

## 2023-01-01 ENCOUNTER — Ambulatory Visit (INDEPENDENT_AMBULATORY_CARE_PROVIDER_SITE_OTHER): Payer: Medicare Other | Admitting: Obstetrics & Gynecology

## 2023-01-01 ENCOUNTER — Encounter: Payer: Self-pay | Admitting: Obstetrics & Gynecology

## 2023-01-01 ENCOUNTER — Ambulatory Visit
Admission: RE | Admit: 2023-01-01 | Discharge: 2023-01-01 | Disposition: A | Discharge status: Home / Self Care | Payer: Medicare Other | Source: Ambulatory Visit | Attending: Obstetrics & Gynecology | Admitting: Obstetrics & Gynecology

## 2023-01-01 VITALS — BP 150/90 | Ht 66.0 in | Wt 169.0 lb

## 2023-01-01 DIAGNOSIS — N950 Postmenopausal bleeding: Secondary | ICD-10-CM | POA: Diagnosis not present

## 2023-01-01 NOTE — Progress Notes (Signed)
   Established Patient Office Visit  Subjective   Patient ID: Beth Rodgers, female    DOB: [birth year]  Age: 88 y.o. MRN: [id number]  Chief Complaint  Patient presents with   Follow-up    HPI    This lovely married 88 yo P2 is here as a referral from Dr. Leo Grosser for an occasion of BRB/pmb about 10 days ago. She had a pelvic ultrasound today that showed a 3 mm endometrium and no abnormalities. She and her husband have been abstinent for years.  She thinks that the blood may have come from one of her many varicosities.  Objective:     BP (!) 150/90   Ht '5\' 6"'$  (1.676 m)   Wt 169 lb (76.7 kg)   BMI 27.28 kg/m    Physical Exam Well nourished, well hydrated Latina, no apparent distress  She uses a cane to aid with ambulation. EG- marked atrophy and some varicosities Grade 3 cystocele Grade 2 uterine prolapse Long Pederson spec used Cervix visualized, marked atrophy noted  It was a little friable against the speculum    Assessment & Plan:   Problem List Items Addressed This Visit   None Visit Diagnoses     PMB (postmenopausal bleeding)    -  Primary     I have reassured her that I believe the bleeding is not from any cancer, but from atrophy. Because she has a h/o breast cancer, I am not inclined to treat with estrogen. She is fine with watchful waiting. She will come back for another exam if the bleeding reoccurs.  No follow-ups on file.    Emily Filbert, MD

## 2023-01-05 ENCOUNTER — Ambulatory Visit (INDEPENDENT_AMBULATORY_CARE_PROVIDER_SITE_OTHER): Payer: Medicare Other | Admitting: Psychology

## 2023-01-05 DIAGNOSIS — F4323 Adjustment disorder with mixed anxiety and depressed mood: Secondary | ICD-10-CM | POA: Diagnosis not present

## 2023-01-05 NOTE — Progress Notes (Signed)
[facility] Counselor/Therapist Progress Note  Patient ID: Beth Rodgers, MRN: [id number]    Date: 01/05/2023  Time Spent: 1:04 pm - 2:00  pm : 56 Minutes  Treatment Type: Individual Therapy.  Reported Symptoms: Rumination, anxiety.    Mental Status Exam: Appearance:  NA     Behavior: Appropriate  Motor: NA  Speech/Language:  Clear and Coherent and Normal Rate  Affect: Congruent  Mood: anxious and dysthymic  Thought process: normal  Thought content:   WNL  Sensory/Perceptual disturbances:   WNL  Orientation: oriented to person, place, time/date, and situation  Attention: Good  Concentration: Good  Memory: WNL  Fund of knowledge:  Good  Insight:   Good  Judgment:  Good  Impulse Control: Good   Risk Assessment: Danger to Self:  No Self-injurious Behavior: No  Danger to Others: No Duty to Warn:no Physical Aggression / Violence:No  Access to Firearms a concern: No  Gang Involvement:No   Subjective:   Beth Rodgers participated from home, via phone, and consented to treatment. Therapist participated from home office. We met online due to [place] pandemic. Beth Rodgers reviewed the events of the past two weeks. Beth Rodgers noted her son's release for Christmas, from jail, and noted his visit for christmas meal. She noted him disappearing for new years. She noted his attitude being positive during the beginning of the visit prior to his behavior devolving in regards to respectful communication and consideration for others. She noted that [facility] discontinued attending meetings that were aiding in helping managing his mood and substance use issues. She noted an increase in her worry and her husband's worry regarding paul. She noted a lack of contact since Christmas. She noted frustration with her other son, Cyril's lack of follow-through on money owed to [place] and her husband. We continued to process this during the session and her efforts to manage her frustration and set boundaries. She  noted a lack of communication and understanding from her son. We worked on identifying balance in her day-to-day and ways to reduce rumination. She noted interest in identifying resources to aid in her current situation. Therapist validated and normalized Beth Rodgers's feelings and experience and provided supportive therapy.    Interventions: Interpersonal & CBT  Diagnosis: Adjustment disorder with mixed anxiety and depressed mood  Plan: Patient is to use CBT, BA, Problem-solving, mindfulness and coping skills to help manage decrease symptoms associated with their diagnosis. (Target date: 01/16/23)   Long-term goal:   Reduce overall level, frequency, and intensity of the feelings of depression and anxiety evidenced by   decreased sadness, rumination, lethargy, and interpersonal stressors  from 6 to 7 days/week to 0 to 1 days/week per client report for at least 3 consecutive months.  Short-term goal:  Verbally express understanding of the relationship between feelings of depression, anxiety and their impact on thinking patterns and behaviors.  Verbalize an understanding of the role that distorted thinking plays in creating fears, excessive worry, and ruminations.  Engage in enjoyable activities on a consistent basis.  [name], LCSW

## 2023-01-13 ENCOUNTER — Other Ambulatory Visit (INDEPENDENT_AMBULATORY_CARE_PROVIDER_SITE_OTHER): Payer: Self-pay | Admitting: Vascular Surgery

## 2023-01-13 DIAGNOSIS — M79669 Pain in unspecified lower leg: Secondary | ICD-10-CM | POA: Insufficient documentation

## 2023-01-13 DIAGNOSIS — I890 Lymphedema, not elsewhere classified: Secondary | ICD-10-CM

## 2023-01-13 DIAGNOSIS — I872 Venous insufficiency (chronic) (peripheral): Secondary | ICD-10-CM

## 2023-01-13 NOTE — Progress Notes (Deleted)
MRN : [id number]  Beth Rodgers is a 88 y.o. (12/05/1935) female who presents with chief complaint of varicose veins hurt.  History of Present Illness:  The patient returns to the office for followup evaluation regarding leg swelling.  The swelling has improved quite a bit and the pain associated with swelling has decreased substantially. There have not been any interval development of a ulcerations or wounds.  She has a history of bilateral great saphenous vein ablations which were intact on duplex ultrasound dated May 07, 2022.   Since the previous visit the patient has been wearing graduated compression stockings and has noted some improvement in the lymphedema. The patient has been using compression routinely morning until night.   The patient also states elevation during the day and exercise (such as walking) is being done too.   The patient has a  history of  DJD and is s/p TKR by Dr [name].  No documented LS spine disease.   The patient denies rest pain or dangling of an extremity off the side of the bed during the night for relief. No open wounds or sores at this time. No history of DVT or phlebitis. No prior vascular interventions or surgeries.   ABI's done 08/20/2021 are normal bilaterally.   No outpatient medications have been marked as taking for the 01/14/23 encounter (Appointment) with Delana Meyer, Dolores Lory, MD.    Past Medical History:  Diagnosis Date   Basal cell carcinoma ([place])    txted with MOHs in past   Cellulitis 08/10/2018   Chronic venous insufficiency    with varicose veins   Family history of breast cancer    Family history of thyroid cancer    GERD (gastroesophageal reflux disease)    Glaucoma    History  of basal cell carcinoma    left nare/BREAST CANCER   History of chicken pox    Hypertension    Hypoglycemia    Hypothyroid    Lymphedema    LE   Osteoarthritis    back pain, L knee pain   Osteopenia 06/2009   DEXA 11/2015: T -2.3 hip, -2.2  spine, on longterm bisphosphonate/evista   Psoriasis    Pyogenic granuloma 04/16/2016    Past Surgical History:  Procedure Laterality Date   BASAL CELL CARCINOMA EXCISION     located on face   BREAST BIOPSY Right    core done ing [place]?   BREAST BIOPSY Left 08/16/2020   3 area bx 4:00 X, IMC, 6:00Q IMC, LN hydro #3-benign   BREAST LUMPECTOMY Left 09/11/2020   two areas of [facility]   CATARACT EXTRACTION Bilateral    bilateral   DEXA  06/2009   T score Spine: -1.8, hip -2.0   EXCISION OF BREAST BIOPSY Left 09/11/2020   Procedure: EXCISION OF BREAST BIOPSY;  Surgeon: [name], MD;  Location: [facility] ORS;  Service: General;  Laterality: Left;   Foam sclerotherapy Bilateral 09/2015   Reed [place], [place]   KNEE ARTHROPLASTY Left 05/27/2022   Procedure: COMPUTER ASSISTED TOTAL KNEE ARTHROPLASTY;  Surgeon: Dereck Leep, MD;  Location: [facility] ORS;  Service: Orthopedics;  Laterality: Left;   MASTECTOMY W/ SENTINEL NODE BIOPSY Left 06/25/2021   Procedure: MASTECTOMY WITH SENTINEL LYMPH NODE BIOPSY;  Surgeon: [name], MD;  Location: [facility] ORS;  Service: General;  Laterality: Left;   NASAL RECONSTRUCTION  2005   for [place] L nare s/p Mohs   nuclear stress test  2014   normal in [place]  PARTIAL MASTECTOMY WITH NEEDLE LOCALIZATION AND AXILLARY SENTINEL LYMPH NODE BX Left 09/11/2020   Procedure: PARTIAL MASTECTOMY WITH NEEDLE LOCALIZATION AND AXILLARY SENTINEL LYMPH NODE BX;  Surgeon: [name], MD;  Location: [facility] ORS;  Service: General;  Laterality: Left;   RADIOFREQUENCY ABLATION Left 01/2012   remnant L G saphenous vein below knee   RADIOFREQUENCY ABLATION Right 02/2013   RLE vein ablation    VEIN LIGATION AND STRIPPING  remote   bilateral G saphenous veins    Social History Social History   Tobacco Use   Smoking status: Never   Smokeless tobacco: Never  Vaping Use   Vaping Use: Never used  Substance Use Topics   Alcohol use: Yes    Comment:  Occasionally drinks wine   Drug use: No    Family History Family History  Problem Relation Age of Onset   Cancer Mother        thyroid cancer   Heart disease Father        CHF   Diabetes Father    Glaucoma Father    Arthritis Father    Coronary artery disease Maternal Uncle    Bipolar disorder Son    Heart disease Sister        CHF   Stroke Sister    Brain cancer Grandson        astrocytoma   Breast cancer Cousin        dx 57s    Allergies  Allergen Reactions   Anastrozole Other (See Comments), Itching and Rash    Back pain  Back pain  Back pain    Exemestane Rash    Pain in right hand  Pain in right hand  Pain in right hand    Naproxen Hives    Had bumps break out on the back  Had bumps break out on the back  Had bumps break out on the back  Had bumps break out on the back  Had bumps break out on the back      REVIEW OF SYSTEMS (Negative unless checked)  Constitutional: []$ Weight loss  []$ Fever  []$ Chills Cardiac: []$ Chest pain   []$ Chest pressure   []$ Palpitations   []$ Shortness of breath when laying flat   []$ Shortness of breath with exertion. Vascular:  []$ Pain in legs with walking   [x]$ Pain in legs with standing  []$ History of DVT   []$ Phlebitis   []$ Swelling in legs   [x]$ Varicose veins   []$ Non-healing ulcers Pulmonary:   []$ Uses home oxygen   []$ Productive cough   []$ Hemoptysis   []$ Wheeze  []$ COPD   []$ Asthma Neurologic:  []$ Dizziness   []$ Seizures   []$ History of stroke   []$ History of TIA  []$ Aphasia   []$ Vissual changes   []$ Weakness or numbness in arm   []$ Weakness or numbness in leg Musculoskeletal:   []$ Joint swelling   []$ Joint pain   []$ Low back pain Hematologic:  []$ Easy bruising  []$ Easy bleeding   []$ Hypercoagulable state   []$ Anemic Gastrointestinal:  []$ Diarrhea   []$ Vomiting  []$ Gastroesophageal reflux/heartburn   []$ Difficulty swallowing. Genitourinary:  []$ Chronic kidney disease   []$ Difficult urination  []$ Frequent urination   []$ Blood in urine Skin:  []$ Rashes   []$ Ulcers   Psychological:  []$ History of anxiety   []$  History of major depression.  Physical Examination  There were no vitals filed for this visit. There is no height or weight on file to calculate BMI. Gen: WD/WN, NAD Head: [place]/AT, No temporalis wasting.  Ear/Nose/Throat: Hearing grossly intact, nares w/o erythema or drainage, pinna without  lesions Eyes: PER, EOMI, sclera nonicteric.  Neck: Supple, no gross masses.  No JVD.  Pulmonary:  Good air movement, no audible wheezing, no use of accessory muscles.  Cardiac: RRR, precordium not hyperdynamic. Vascular:  Large varicosities present, greater than 10 mm ***.  Veins are tender to palpation  Mild venous stasis changes to the legs bilaterally.  Trace soft pitting edema CEAP C3sEpAsPr Vessel Right Left  Radial Palpable Palpable  Gastrointestinal: soft, non-distended. No guarding/no peritoneal signs.  Musculoskeletal: M/S 5/5 throughout.  No deformity.  Neurologic: CN 2-12 intact. Pain and light touch intact in extremities.  Symmetrical.  Speech is fluent. Motor exam as listed above. Psychiatric: Judgment intact, Mood & affect appropriate for pt's clinical situation. Dermatologic: Venous rashes no ulcers noted.  No changes consistent with cellulitis. Lymph : No lichenification or skin changes of chronic lymphedema.  CBC Lab Results  Component Value Date   WBC 5.9 06/19/2022   HGB 12.2 06/19/2022   HCT 37.3 06/19/2022   MCV 88.8 06/19/2022   PLT 342.0 06/19/2022    BMET    Component Value Date/Time   NA 138 06/19/2022 0912   K 4.6 06/19/2022 0912   CL 102 06/19/2022 0912   CO2 27 06/19/2022 0912   GLUCOSE 84 06/19/2022 0912   BUN 20 06/19/2022 0912   CREATININE 0.68 06/19/2022 0912   CALCIUM 9.2 06/19/2022 0912   GFRNONAA >60 05/15/2022 1231   GFRAA >60 08/21/2020 1625   CrCl cannot be calculated (Patient's most recent lab result is older than the maximum 21 days allowed.).  COAG No results found for: "INR",  "PROTIME"  Radiology US PELVIS (TRANSABDOMINAL ONLY)  Result Date: 01/01/2023 CLINICAL DATA:  Postmenopausal bleeding EXAM: TRANSABDOMINAL ULTRASOUND OF PELVIS TECHNIQUE: Transabdominal ultrasound examination of the pelvis was performed including evaluation of the uterus, ovaries, adnexal regions, and pelvic cul-de-sac. Patient declined transvaginal imaging. COMPARISON:  None Available. FINDINGS: Uterus Measurements: 5.5 x 2.0 x 3.9 cm = volume: 17 mL. Anteverted. Normal morphology without mass. Endometrium Thickness: 3 mm.  No endometrial fluid or mass Right ovary Scratch at Not visualized, likely obscured by bowel Left ovary Not visualized, likely obscured by bowel Other findings: No free pelvic fluid or adnexal masses. Visualized portion of urinary bladder unremarkable. IMPRESSION: Nonvisualization of ovaries. Patient declined transvaginal imaging. No pelvic sonographic abnormalities identified. Specifically, endometrial stripe measures 3 mm thick and is normal in appearance; in the setting of post-menopausal bleeding, this is consistent with a benign etiology such as endometrial atrophy. If bleeding remains unresponsive to hormonal or medical therapy, sonohysterogram should be considered for focal lesion work-up. (Ref: Radiological Reasoning: Algorithmic Workup of Abnormal Vaginal Bleeding with Endovaginal Sonography and Sonohysterography. [id number]) Electronically Signed   By: Lavonia Dana M.D.   On: 01/01/2023 15:14     Assessment/Plan There are no diagnoses linked to this encounter.   Hortencia Pilar, MD  01/13/2023 12:39 PM

## 2023-01-14 ENCOUNTER — Encounter (INDEPENDENT_AMBULATORY_CARE_PROVIDER_SITE_OTHER): Payer: Self-pay | Admitting: Vascular Surgery

## 2023-01-14 ENCOUNTER — Encounter (INDEPENDENT_AMBULATORY_CARE_PROVIDER_SITE_OTHER): Payer: Medicare Other

## 2023-01-14 ENCOUNTER — Ambulatory Visit (INDEPENDENT_AMBULATORY_CARE_PROVIDER_SITE_OTHER): Payer: Medicare Other | Admitting: Vascular Surgery

## 2023-01-14 ENCOUNTER — Ambulatory Visit (INDEPENDENT_AMBULATORY_CARE_PROVIDER_SITE_OTHER): Payer: Medicare Other

## 2023-01-14 VITALS — BP 181/90 | HR 68 | Resp 16 | Wt 170.8 lb

## 2023-01-14 DIAGNOSIS — I4891 Unspecified atrial fibrillation: Secondary | ICD-10-CM

## 2023-01-14 DIAGNOSIS — I872 Venous insufficiency (chronic) (peripheral): Secondary | ICD-10-CM

## 2023-01-14 DIAGNOSIS — I77810 Thoracic aortic ectasia: Secondary | ICD-10-CM

## 2023-01-14 DIAGNOSIS — I83813 Varicose veins of bilateral lower extremities with pain: Secondary | ICD-10-CM

## 2023-01-14 DIAGNOSIS — I10 Essential (primary) hypertension: Secondary | ICD-10-CM | POA: Diagnosis not present

## 2023-01-14 DIAGNOSIS — I890 Lymphedema, not elsewhere classified: Secondary | ICD-10-CM | POA: Diagnosis not present

## 2023-01-15 ENCOUNTER — Encounter (INDEPENDENT_AMBULATORY_CARE_PROVIDER_SITE_OTHER): Payer: Self-pay | Admitting: Vascular Surgery

## 2023-01-15 NOTE — Progress Notes (Signed)
MRN : [id number]  Beth Rodgers is a 88 y.o. (1935-01-04) female who presents with chief complaint of varicose veins hurt.  History of Present Illness:   The patient returns for followup evaluation of increasingly painful varicose veins.  She has been followed by me form many years and has had venous ulcers in the past.  Currently, her ankles are free of any ulceration but in particular on the left she has noted increasing pain associated with increasing size of numerous varicosities.  The patient continues to have pain in the lower extremities with dependency. The pain is lessened with elevation. Graduated compression stockings, Class I (20-30 mmHg), have been worn but the stockings do not eliminate the leg pain. Over-the-counter analgesics do not improve the symptoms. The degree of discomfort continues to interfere with daily activities. The patient notes the pain in the legs is causing problems with daily exercise, at the workplace and even with household activities and maintenance such as standing in the kitchen preparing meals and doing dishes.  The patient has been wearing graduated compression religiously on a daily basis for many years.  She exercises regularly.  She elevates frequently particularly using a recliner at home.  She also has been using over-the-counter pain medication as needed for treatment of the pain in her ankle and calf area.  She has had bilateral great saphenous vein ablations in the remote past.  She has also had numerous sessions of sclerotherapy.  Venous ultrasound shows normal deep venous system, no evidence of acute or chronic DVT.  Superficial reflux is present in the left small saphenous.  The great saphenous veins remain ablated.   Current Meds  Medication Sig   alendronate (FOSAMAX) 70 MG tablet Take 1 tablet (70 mg total) by mouth every Sunday.   aspirin EC 81 MG tablet Take 81 mg by mouth in the morning and at bedtime. Swallow whole.   Calcium  Carb-Cholecalciferol (CALCIUM 600 + D PO) Take 1 tablet by mouth daily. Ca 500 + Vit D 800   celecoxib (CELEBREX) 200 MG capsule Take 200 mg by mouth daily.   Cholecalciferol (VITAMIN D3) 25 MCG (1000 UT) CAPS Take 1 capsule (1,000 Units total) by mouth daily.   ciclopirox (LOPROX) 0.77 % cream Apply to feet, around toenails, between toes once daily for fungus.   Ciclopirox 0.77 % gel Apply 1 application  topically 2 (two) times daily as needed (fungus).   diclofenac Sodium (VOLTAREN) 1 % GEL Apply topically 3 (three) times daily as needed.   latanoprost (XALATAN) 0.005 % ophthalmic solution latanoprost 0.005 % eye drops  PLACE 1 DROP INTO BOTH EYES ONCE EVERY EVENING   levothyroxine (SYNTHROID) 75 MCG tablet Take 1 tablet (75 mcg total) by mouth daily.   losartan (COZAAR) 50 MG tablet Take 1 tablet (50 mg total) by mouth in the morning and at bedtime.   Magnesium 400 MG TABS Take 400 mg by mouth daily.   metoprolol tartrate (LOPRESSOR) 25 MG tablet Take 25 mg by mouth daily.   Multiple Vitamin (MULTIVITAMIN) tablet Take 1 tablet by mouth daily.   Roflumilast (ZORYVE) 0.3 % CREA Apply 1 application. topically every other day. Sample   SELENIUM PO Take 1 tablet by mouth daily.   silver sulfADIAZINE (SILVADENE) 1 % cream Apply 1 application topically daily.   tacrolimus (PROTOPIC) 0.1 % ointment APPLY TOPICALLY TO AFFECTED AREA(S) TWICE DAILY   vitamin C (ASCORBIC ACID) 500 MG tablet Take 500 mg by mouth daily.  vitamin E 180 MG (400 UNITS) capsule Take 400 Units by mouth daily.    Past Medical History:  Diagnosis Date   Basal cell carcinoma (BCC)    txted with MOHs in past   Cellulitis 08/10/2018   Chronic venous insufficiency    with varicose veins   Family history of breast cancer    Family history of thyroid cancer    GERD (gastroesophageal reflux disease)    Glaucoma    History  of basal cell carcinoma    left nare/BREAST CANCER   History of chicken pox    Hypertension     Hypoglycemia    Hypothyroid    Lymphedema    LE   Osteoarthritis    back pain, L knee pain   Osteopenia 06/2009   DEXA 11/2015: T -2.3 hip, -2.2 spine, on longterm bisphosphonate/evista   Psoriasis    Pyogenic granuloma 04/16/2016    Past Surgical History:  Procedure Laterality Date   BASAL CELL CARCINOMA EXCISION     located on face   BREAST BIOPSY Right    core done ing [place]?   BREAST BIOPSY Left 08/16/2020   3 area bx 4:00 X, IMC, 6:00Q IMC, LN hydro #3-benign   BREAST LUMPECTOMY Left 09/11/2020   two areas of [facility]   CATARACT EXTRACTION Bilateral    bilateral   DEXA  06/2009   T score Spine: -1.8, hip -2.0   EXCISION OF BREAST BIOPSY Left 09/11/2020   Procedure: EXCISION OF BREAST BIOPSY;  Surgeon: [name], MD;  Location: [facility];  Service: General;  Laterality: Left;   Foam sclerotherapy Bilateral 09/2015   [place], [place]   KNEE ARTHROPLASTY Left 05/27/2022   Procedure: COMPUTER ASSISTED TOTAL KNEE ARTHROPLASTY;  Surgeon: Dereck Leep, MD;  Location: [facility];  Service: Orthopedics;  Laterality: Left;   MASTECTOMY W/ SENTINEL NODE BIOPSY Left 06/25/2021   Procedure: MASTECTOMY WITH SENTINEL LYMPH NODE BIOPSY;  Surgeon: [name], MD;  Location: [facility];  Service: General;  Laterality: Left;   NASAL RECONSTRUCTION  2005   for [place] L nare s/p Mohs   nuclear stress test  2014   normal in [place] NODE BX Left 09/11/2020   Procedure: PARTIAL MASTECTOMY WITH NEEDLE LOCALIZATION AND AXILLARY SENTINEL LYMPH NODE [place];  Surgeon: [name], MD;  Location: [facility];  Service: General;  Laterality: Left;   RADIOFREQUENCY ABLATION Left 01/2012   remnant L G saphenous vein below knee   RADIOFREQUENCY ABLATION Right 02/2013   RLE vein ablation    VEIN LIGATION AND STRIPPING  remote   bilateral G saphenous veins    Social History Social History   Tobacco  Use   Smoking status: Never   Smokeless tobacco: Never  Vaping Use   Vaping Use: Never used  Substance Use Topics   Alcohol use: Yes    Comment: Occasionally drinks wine   Drug use: No    Family History Family History  Problem Relation Age of Onset   Cancer Mother        thyroid cancer   Heart disease Father        CHF   Diabetes Father    Glaucoma Father    Arthritis Father    Coronary artery disease Maternal Uncle    Bipolar disorder Son    Heart disease Sister        CHF   Stroke Sister    Brain cancer  Grandson        astrocytoma   Breast cancer Cousin        dx 68s    Allergies  Allergen Reactions   Anastrozole Other (See Comments), Itching and Rash    Back pain  Back pain  Back pain    Exemestane Rash    Pain in right hand  Pain in right hand  Pain in right hand    Naproxen Hives    Had bumps break out on the back  Had bumps break out on the back  Had bumps break out on the back  Had bumps break out on the back  Had bumps break out on the back      REVIEW OF SYSTEMS (Negative unless checked)  Constitutional: '[]'$ Weight loss  '[]'$ Fever  '[]'$ Chills Cardiac: '[]'$ Chest pain   '[]'$ Chest pressure   '[]'$ Palpitations   '[]'$ Shortness of breath when laying flat   '[]'$ Shortness of breath with exertion. Vascular:  '[]'$ Pain in legs with walking   '[x]'$ Pain in legs with standing  '[]'$ History of DVT   '[]'$ Phlebitis   '[]'$ Swelling in legs   '[x]'$ Varicose veins   '[]'$ Non-healing ulcers Pulmonary:   '[]'$ Uses home oxygen   '[]'$ Productive cough   '[]'$ Hemoptysis   '[]'$ Wheeze  '[]'$ COPD   '[]'$ Asthma Neurologic:  '[]'$ Dizziness   '[]'$ Seizures   '[]'$ History of stroke   '[]'$ History of TIA  '[]'$ Aphasia   '[]'$ Vissual changes   '[]'$ Weakness or numbness in arm   '[]'$ Weakness or numbness in leg Musculoskeletal:   '[]'$ Joint swelling   '[]'$ Joint pain   '[]'$ Low back pain Hematologic:  '[]'$ Easy bruising  '[]'$ Easy bleeding   '[]'$ Hypercoagulable state   '[]'$ Anemic Gastrointestinal:  '[]'$ Diarrhea   '[]'$ Vomiting  '[]'$ Gastroesophageal reflux/heartburn   '[]'$ Difficulty  swallowing. Genitourinary:  '[]'$ Chronic kidney disease   '[]'$ Difficult urination  '[]'$ Frequent urination   '[]'$ Blood in urine Skin:  '[]'$ Rashes   '[]'$ Ulcers  Psychological:  '[]'$ History of anxiety   '[]'$  History of major depression.  Physical Examination  Vitals:   01/14/23 1404  BP: (!) 181/90  Pulse: 68  Resp: 16  Weight: 170 lb 12.8 oz (77.5 kg)   Body mass index is 27.57 kg/m. Gen: WD/WN, NAD Head: [place]/AT, No temporalis wasting.  Ear/Nose/Throat: Hearing grossly intact, nares w/o erythema or drainage, pinna without lesions Eyes: PER, EOMI, sclera nonicteric.  Neck: Supple, no gross masses.  No JVD.  Pulmonary:  Good air movement, no audible wheezing, no use of accessory muscles.  Cardiac: RRR, precordium not hyperdynamic. Vascular:  Large varicosities present, greater than 10 mm bilateral ankles and calf areas particularly posteriorly.  Veins are tender to palpation severe venous stasis changes to the legs bilaterally with scarring from healed venous ulcers in the past.  Trace soft pitting edema CEAP C5sEpAsPr Vessel Right Left  Radial Palpable Palpable  Gastrointestinal: soft, non-distended. No guarding/no peritoneal signs.  Musculoskeletal: M/S 5/5 throughout.  No deformity.  Neurologic: CN 2-12 intact. Pain and light touch intact in extremities.  Symmetrical.  Speech is fluent. Motor exam as listed above. Psychiatric: Judgment intact, Mood & affect appropriate for pt's clinical situation. Dermatologic: Venous rashes no ulcers noted.  No changes consistent with cellulitis. Lymph : No lichenification or skin changes of chronic lymphedema.  CBC Lab Results  Component Value Date   WBC 5.9 06/19/2022   HGB 12.2 06/19/2022   HCT 37.3 06/19/2022   MCV 88.8 06/19/2022   PLT 342.0 06/19/2022    BMET    Component Value Date/Time   NA 138 06/19/2022 0912   K 4.6  06/19/2022 0912   CL 102 06/19/2022 0912   CO2 27 06/19/2022 0912   GLUCOSE 84 06/19/2022 0912   BUN 20 06/19/2022 0912    CREATININE 0.68 06/19/2022 0912   CALCIUM 9.2 06/19/2022 0912   GFRNONAA >60 05/15/2022 1231   GFRAA >60 08/21/2020 1625   CrCl cannot be calculated (Patient's most recent lab result is older than the maximum 21 days allowed.).  COAG No results found for: "INR", "PROTIME"  Radiology US PELVIS (TRANSABDOMINAL ONLY)  Result Date: 01/01/2023 CLINICAL DATA:  Postmenopausal bleeding EXAM: TRANSABDOMINAL ULTRASOUND OF PELVIS TECHNIQUE: Transabdominal ultrasound examination of the pelvis was performed including evaluation of the uterus, ovaries, adnexal regions, and pelvic cul-de-sac. Patient declined transvaginal imaging. COMPARISON:  None Available. FINDINGS: Uterus Measurements: 5.5 x 2.0 x 3.9 cm = volume: 17 mL. Anteverted. Normal morphology without mass. Endometrium Thickness: 3 mm.  No endometrial fluid or mass Right ovary Scratch at Not visualized, likely obscured by bowel Left ovary Not visualized, likely obscured by bowel Other findings: No free pelvic fluid or adnexal masses. Visualized portion of urinary bladder unremarkable. IMPRESSION: Nonvisualization of ovaries. Patient declined transvaginal imaging. No pelvic sonographic abnormalities identified. Specifically, endometrial stripe measures 3 mm thick and is normal in appearance; in the setting of post-menopausal bleeding, this is consistent with a benign etiology such as endometrial atrophy. If bleeding remains unresponsive to hormonal or medical therapy, sonohysterogram should be considered for focal lesion work-up. (Ref: Radiological Reasoning: Algorithmic Workup of Abnormal Vaginal Bleeding with Endovaginal Sonography and Sonohysterography. AJR 2008; [id number]) Electronically Signed   By: Lavonia Dana M.D.   On: 01/01/2023 15:14     Assessment/Plan 1. Varicose veins of both lower extremities with pain Recommend  I have reviewed my previous  discussion with the patient regarding  varicose veins and why they cause symptoms. Patient will  continue  wearing graduated compression stockings class 1 on a daily basis, beginning first thing in the morning and removing them in the evening.  The patient is CEAP C5sEpAsPr.  The patient has been wearing compression for more than 12 weeks (patient has actually been wearing graduated compression stockings on a daily basis for years) with no or little benefit.  The patient has been exercising daily for more than 12 weeks. The patient has been elevating and taking OTC pain medications for more than 12 weeks.  None of these have have eliminated the pain related to the varicose veins and venous reflux or the discomfort regarding venous congestion.    In addition, behavioral modification including elevation during the day was again discussed and this will continue.  The patient has utilized over the counter pain medications and has been exercising.  However, at this time conservative therapy has not alleviated the patient's symptoms of leg pain and swelling  Recommend: laser ablation of the left small saphenous veins to eliminate the symptoms of pain and swelling of the lower extremities caused by the severe superficial venous reflux disease.   2. Chronic venous insufficiency No surgery or intervention at this point in time.   The patient is CEAP C5sEpAsPr   I have discussed with the patient venous insufficiency and why it  causes symptoms. I have discussed with the patient the chronic skin changes that accompany venous insufficiency and the long term sequela such as infection and ulceration.  Patient will begin wearing graduated compression stockings or compression wraps on a daily basis.  The patient will put the compression on first thing in the morning and removing them  in the evening. The patient is instructed specifically not to sleep in the compression.  At this point given her severe venous stasis disease history of multiple ulcerations and increasing pain associated with her increasingly  large varicosities I believe treating her varicose veins will have a huge beneficial impact on her overall venous disease.  I have recommended laser ablation of the left small saphenous followed by further sclerotherapy.  In addition, behavioral modification including several periods of elevation of the lower extremities during the day will be continued. I have demonstrated that proper elevation is a position with the ankles at heart level.  The patient is instructed to begin routine exercise, especially walking on a daily basis  3. Essential hypertension Continue antihypertensive medications as already ordered, these medications have been reviewed and there are no changes at this time.  4. Ascending aorta dilatation (HCC) Recommend:  No surgery or intervention is indicated at this time.  CT angiogram of the chest to rule out PE performed January 04, 2021 demonstrates a 4.1 cm ascending thoracic aortic aneurysm with mild atherosclerotic changes of the thoracic aorta.  The patient has an asymptomatic thoracic aortic aneurysm that is less than 6.0 cm in maximal diameter.  I have discussed the natural history of thoracic aortic aneurysm and the small risk of rupture for aneurysm less than 6.5 cm in size.  However, as these small aneurysms tend to enlarge over time, continued surveillance with CT scan is mandatory.   I have also discussed optimizing medical management with hypertension and lipid control and the importance of abstinence from tobacco.    Should the patient develop new onset chest or back pain or signs of peripheral embolization they are instructed to seek medical attention immediately and to alert the physician providing care that they have an aneurysm in the chest.   The patient voices their understanding.  The patient will return as ordered with a CT scan of the chest every 2 to 3 years  5. Lone atrial fibrillation (HCC) Continue antiarrhythmia medications as already ordered,  these medications have been reviewed and there are no changes at this time.  Continue anticoagulation as ordered by Cardiology Service    Hortencia Pilar, MD  01/15/2023 7:50 AM

## 2023-01-19 ENCOUNTER — Ambulatory Visit (INDEPENDENT_AMBULATORY_CARE_PROVIDER_SITE_OTHER): Payer: Medicare Other | Admitting: Psychology

## 2023-01-19 DIAGNOSIS — F4323 Adjustment disorder with mixed anxiety and depressed mood: Secondary | ICD-10-CM

## 2023-01-19 NOTE — Progress Notes (Signed)
Comprehensive Clinical Assessment (CCA) Note  01/19/2023 Beth Rodgers [id number]  Time Spent: 1:05  pm - 2:00 pm: 11 Minutes  Chief Complaint: No chief complaint on file.  Visit Diagnosis: Anxiety and depression   Guardian/Payee:  Self    Paperwork requested: No   Reason for Visit /Presenting Problem: depression and anxiety.  Mental Status Exam: Appearance:   NA     Behavior:  Appropriate  Motor:  NA   Speech/Language:   Normal Rate  Affect:  Appropriate  Mood:  normal  Thought process:  normal  Thought content:    WNL  Sensory/Perceptual disturbances:    WNL  Orientation:  oriented to person, place, time/date, and situation  Attention:  Good  Concentration:  Good  Memory:  WNL  Fund of knowledge:   Good  Insight:    Good  Judgment:   Good  Impulse Control:  Good   Reported Symptoms:  Depression and Anxiety  Risk Assessment: Danger to Self:  No Self-injurious Behavior: No Danger to Others: No Duty to Warn:no Physical Aggression / Violence:No  Access to Firearms a concern: No  Gang Involvement:No  Patient / guardian was educated about steps to take if suicide or homicide risk level increases between visits: no While future psychiatric events cannot be accurately predicted, the patient does not currently require acute inpatient psychiatric care and does not currently meet [facility] involuntary commitment criteria.  Substance Abuse History: Current substance abuse: No     Alcohol: denied.  Caffeine: 3x coffee of a day.  Tobacco: denied.   Past Psychiatric History:   Previous psychological history is significant for anxiety and depression Outpatient Providers:Sinead Hockman, Msw, LCSW History of Psych Hospitalization: No  Psychological Testing:  na    Abuse History:  Victim of: No.,  na    Report needed: No. Victim of Neglect:No. Perpetrator of  na   Witness / Exposure to Domestic Violence: No   Protective Services Involvement: No  Witness to  [employer] Violence:  No   Family History:  Family History  Problem Relation Age of Onset   Cancer Mother        thyroid cancer   Heart disease Father        CHF   Diabetes Father    Glaucoma Father    Arthritis Father    Coronary artery disease Maternal Uncle    Bipolar disorder Son    Heart disease Sister        CHF   Stroke Sister    Brain cancer Grandson        astrocytoma   Breast cancer Cousin        dx 84s    Living situation: the patient lives with their family  Sexual Orientation: Straight  Relationship Status: married  Name of spouse / other: [name] - 99 years.  Met in 1964 and married in 1968 . If a parent, number of children / ages: [name]: 26  Cyril: 53   Support Systems: spouse  Museum/gallery curator Stress:  Yes , financial struggles due to eldest son owning money.   Income/Employment/Disability: retirement.   Military Service: No   Educational History: Education: Scientist, product/process development: NA  Any cultural differences that may affect / interfere with treatment:  not applicable   Recreation/Hobbies: Painting, writing, enjoying the arts.   Stressors: Other: financial and familial, health     Strengths: Supportive Relationships, Family, Friends, Hopefulness, Conservator, museum/gallery, and Able to Communicate Effectively  Barriers:  Mood and  sons' behavior.   Legal History: Pending legal issue / charges: The patient has no significant history of legal issues. History of legal issue / charges:  NA  Medical History/Surgical History: reviewed Past Medical History:  Diagnosis Date   Basal cell carcinoma ([place])    txted with MOHs in past   Cellulitis 08/10/2018   Chronic venous insufficiency    with varicose veins   Family history of breast cancer    Family history of thyroid cancer    GERD (gastroesophageal reflux disease)    Glaucoma    History  of basal cell carcinoma    left nare/BREAST CANCER   History of chicken pox    Hypertension     Hypoglycemia    Hypothyroid    Lymphedema    LE   Osteoarthritis    back pain, L knee pain   Osteopenia 06/2009   DEXA 11/2015: T -2.3 hip, -2.2 spine, on longterm bisphosphonate/evista   Psoriasis    Pyogenic granuloma 04/16/2016    Past Surgical History:  Procedure Laterality Date   BASAL CELL CARCINOMA EXCISION     located on face   BREAST BIOPSY Right    core done ing [place]?   BREAST BIOPSY Left 08/16/2020   3 area bx 4:00 X, IMC, 6:00Q IMC, LN hydro #3-benign   BREAST LUMPECTOMY Left 09/11/2020   two areas of [facility]   CATARACT EXTRACTION Bilateral    bilateral   DEXA  06/2009   T score Spine: -1.8, hip -2.0   EXCISION OF BREAST BIOPSY Left 09/11/2020   Procedure: EXCISION OF BREAST BIOPSY;  Surgeon: [name], MD;  Location: [facility] ORS;  Service: General;  Laterality: Left;   Foam sclerotherapy Bilateral 09/2015   [place], [place]   KNEE ARTHROPLASTY Left 05/27/2022   Procedure: COMPUTER ASSISTED TOTAL KNEE ARTHROPLASTY;  Surgeon: Dereck Leep, MD;  Location: [facility] ORS;  Service: Orthopedics;  Laterality: Left;   MASTECTOMY W/ SENTINEL NODE BIOPSY Left 06/25/2021   Procedure: MASTECTOMY WITH SENTINEL LYMPH NODE BIOPSY;  Surgeon: [name], MD;  Location: [facility] ORS;  Service: General;  Laterality: Left;   NASAL RECONSTRUCTION  2005   for [place] L nare s/p Mohs   nuclear stress test  2014   normal in [place] BX Left 09/11/2020   Procedure: PARTIAL MASTECTOMY WITH NEEDLE LOCALIZATION AND AXILLARY SENTINEL LYMPH NODE BX;  Surgeon: [name], MD;  Location: [facility] ORS;  Service: General;  Laterality: Left;   RADIOFREQUENCY ABLATION Left 01/2012   remnant L G saphenous vein below knee   RADIOFREQUENCY ABLATION Right 02/2013   RLE vein ablation    VEIN LIGATION AND STRIPPING  remote   bilateral G saphenous veins    Medications: Current Outpatient  Medications  Medication Sig Dispense Refill   alendronate (FOSAMAX) 70 MG tablet Take 1 tablet (70 mg total) by mouth every Sunday. 12 tablet 3   aspirin EC 81 MG tablet Take 81 mg by mouth in the morning and at bedtime. Swallow whole.     Calcium Carb-Cholecalciferol (CALCIUM 600 + D PO) Take 1 tablet by mouth daily. Ca 500 + Vit D 800     celecoxib (CELEBREX) 200 MG capsule Take 200 mg by mouth daily.     Cholecalciferol (VITAMIN D3) 25 MCG (1000 UT) CAPS Take 1 capsule (1,000 Units total) by mouth daily. 30 capsule    ciclopirox (LOPROX) 0.77 % cream Apply to feet,  around toenails, between toes once daily for fungus. 90 g 2   Ciclopirox 0.77 % gel Apply 1 application  topically 2 (two) times daily as needed (fungus).     diclofenac Sodium (VOLTAREN) 1 % GEL Apply topically 3 (three) times daily as needed.     latanoprost (XALATAN) 0.005 % ophthalmic solution latanoprost 0.005 % eye drops  PLACE 1 DROP INTO BOTH EYES ONCE EVERY EVENING     levothyroxine (SYNTHROID) 75 MCG tablet Take 1 tablet (75 mcg total) by mouth daily. 90 tablet 3   losartan (COZAAR) 50 MG tablet Take 1 tablet (50 mg total) by mouth in the morning and at bedtime. 60 tablet 5   Magnesium 400 MG TABS Take 400 mg by mouth daily.     metoprolol tartrate (LOPRESSOR) 25 MG tablet Take 25 mg by mouth daily.     Multiple Vitamin (MULTIVITAMIN) tablet Take 1 tablet by mouth daily.     Roflumilast (ZORYVE) 0.3 % CREA Apply 1 application. topically every other day. Sample     SELENIUM PO Take 1 tablet by mouth daily.     silver sulfADIAZINE (SILVADENE) 1 % cream Apply 1 application topically daily. 50 g 4   tacrolimus (PROTOPIC) 0.1 % ointment APPLY TOPICALLY TO AFFECTED AREA(S) TWICE DAILY 60 g 2   vitamin C (ASCORBIC ACID) 500 MG tablet Take 500 mg by mouth daily.     vitamin E 180 MG (400 UNITS) capsule Take 400 Units by mouth daily.     No current facility-administered medications for this visit.    Allergies  Allergen  Reactions   Anastrozole Other (See Comments), Itching and Rash    Back pain  Back pain  Back pain    Exemestane Rash    Pain in right hand  Pain in right hand  Pain in right hand    Naproxen Hives    Had bumps break out on the back  Had bumps break out on the back  Had bumps break out on the back  Had bumps break out on the back  Had bumps break out on the back     Diagnoses:  Adjustment disorder with mixed anxiety and depressed mood  Psychiatric Treatment: No , na  Plan of Care: Continued OPT  Narrative:   [name] participated from home, via phone, and consented to treatment. Therapist participated from office. We met online due to [place] pandemic.This is Jaeda's annual reevaluation. She noted numerous stressors in the past year that continue to affect her mood, overall. Family stressors, health related stressors, and financial stressors. Familial stressors include with her sons. Shadonna's youngest son, [name], has a history of severe unaddressed bipolar disorder and substance use, along with a history of numerous incarcerations along with unpredictable behavior and being quick to temper. She noted her eldest son, [name], owes Luciann and [name] a large amount of money and is not reliable in repaying what he owes while poorly spending to live a seemingly lavish lifestyle. She noted that she and her husband have attempted to address this to no avail. Aniza noted some strain with her son cyril and his wife due to personality differences. Jkayla noted significant health related issues including numerous surgeries in the past 12 months. She noted her longing to return home to [place], for a visit, but noted numerous barriers including [name] and her own health.  She noted a lack of self-care specifically writing or drawing and noted a need for more sleep which is one  of the barriers towards engagement. We completed the GAD-7 (4) during the session and will complete the PHQ-9 during a follow-up. Trina  would benefit from continued counseling to manage stressors, bolster coping skills, and manage overall symptoms. Therapist provided a handout regarding radical acceptance to be reviewed going forward.    [name], LCSW

## 2023-01-21 ENCOUNTER — Telehealth (INDEPENDENT_AMBULATORY_CARE_PROVIDER_SITE_OTHER): Payer: Self-pay

## 2023-01-21 ENCOUNTER — Other Ambulatory Visit (INDEPENDENT_AMBULATORY_CARE_PROVIDER_SITE_OTHER): Payer: Self-pay | Admitting: Vascular Surgery

## 2023-01-21 NOTE — Telephone Encounter (Signed)
Patient left a message stating that our office will submitting for laser ablation and foam sclerotherapy afterwards. Patient stated that the smaller vein is more painful and waking her out of sleep. Patient wanted to know if we could submit in for the sclerotherapy first. Patient will speak with Lavella Lemons about authorization and Dr Delana Meyer will be made aware with information.

## 2023-01-21 NOTE — Telephone Encounter (Signed)
Patient was informed that Dr Delana Meyer recommended for the laser ablation to be done first to treat saphenous vein then move forward with sclerotherapy.Patient stated she will be contacting her insurance to see if she could receive sclerotherapy before laser ablation. Patient stated that she will contact the office with further information.

## 2023-01-26 NOTE — Telephone Encounter (Signed)
I spoke with patient this morning regarding laser procedures stating that we are still waiting on back ordered supplies. Patient is stating that she wants to move forward with the sclerotherapy (she thinks FOAM). She states that she has talked with insurance and states that they are ok with the prior authorization of  sclerotherapy. Patient states that she requested a copy be sent to [place]. Dr. Delana Meyer - are you ok with doing the right leg FOAM (?) sclerotherapy before the laser. She is stating that the right leg is giving her more problems. Also - will it be FOAM vs. SALINE. Thank you!

## 2023-01-28 DIAGNOSIS — H6983 Other specified disorders of Eustachian tube, bilateral: Secondary | ICD-10-CM | POA: Diagnosis not present

## 2023-01-28 DIAGNOSIS — H903 Sensorineural hearing loss, bilateral: Secondary | ICD-10-CM | POA: Diagnosis not present

## 2023-02-02 ENCOUNTER — Telehealth: Payer: Self-pay | Admitting: Family Medicine

## 2023-02-02 ENCOUNTER — Ambulatory Visit (INDEPENDENT_AMBULATORY_CARE_PROVIDER_SITE_OTHER): Payer: Medicare Other | Admitting: Psychology

## 2023-02-02 DIAGNOSIS — F4323 Adjustment disorder with mixed anxiety and depressed mood: Secondary | ICD-10-CM

## 2023-02-02 NOTE — Telephone Encounter (Signed)
Spoke to patient by telephone and was advised that she is not happy that she is being put off by the surgeon to have procedures done that are necessary. Patient stated that she is afraid that the vein is going to burst. Patient stated that she is not having any luck with getting a response from Dr. Delana Meyer and wants to know if Dr. Danise Mina can refer her to another specialist. Patient stated that she is having pain in her leg. Patient stated that she has talked with her insurance company and was advised that they will pay for the procedures that she needs to have done.  See phone note 01/21/23 to Dr. Delana Meyer.

## 2023-02-02 NOTE — Telephone Encounter (Signed)
Patient called in requesting a call regarding a procedure that she needs,and Dr Waldron Labs is telling her that the insurance is not going to cover it,but her insurance say that they will cover it. She wants to look into seeing another specialist.

## 2023-02-02 NOTE — Progress Notes (Signed)
[place] Counselor/Therapist Progress Note  Patient ID: Beth Rodgers, MRN: [id number]    Date: 02/02/2023  Time Spent: 1:03 pm - 1:58  pm : 55 Minutes  Treatment Type: Individual Therapy.  Reported Symptoms: Rumination, anxiety.    Mental Status Exam: Appearance:  NA     Behavior: Appropriate  Motor: NA  Speech/Language:  Clear and Coherent and Normal Rate  Affect: Congruent  Mood: anxious and dysthymic  Thought process: normal  Thought content:   WNL  Sensory/Perceptual disturbances:   WNL  Orientation: oriented to person, place, time/date, and situation  Attention: Good  Concentration: Good  Memory: WNL  Fund of knowledge:  Good  Insight:   Good  Judgment:  Good  Impulse Control: Good   Risk Assessment: Danger to Self:  No Self-injurious Behavior: No  Danger to Others: No Duty to Warn:no Physical Aggression / Violence:No  Access to Firearms a concern: No  Gang Involvement:No   Subjective:   Beth Rodgers participated from home, via phone, and consented to treatment. Therapist participated from office. We met online due to Beth Rodgers pandemic. Beth Rodgers reviewed the events of the past two weeks. We completed the PHQ-9 during the session. Please below for details. We reviewed numerous treatment approaches including CBT, BA, Problem Solving, and Solution focused therapy. Psych-education regarding the Marshea's diagnosis of Adjustment disorder with mixed anxiety and depressed mood was provided during the session. We discussed Beth Rodgers's goals treatment goals (see below)  Beth Rodgers provided verbal approval of the treatment plan.   Interventions: Psycho-education & Goal Setting.       02/02/2023    1:05 PM 06/22/2022   10:42 AM 06/19/2021   10:45 AM 05/31/2020    2:56 PM 05/26/2019    2:10 PM  Depression screen PHQ 2/9  Decreased Interest 0 0 0 0 0  Down, Depressed, Hopeless 1 0 0 0 0  PHQ - 2 Score 1 0 0 0 0  Altered sleeping 3  0 0 0  Tired, decreased  energy 1  0 0 0  Change in appetite 0  0 0 0  Feeling bad or failure about yourself  0  0 0 0  Trouble concentrating 0  0 0 0  Moving slowly or fidgety/restless 0  0 0 0  Suicidal thoughts 0  0 0 0  PHQ-9 Score 5  0 0 0  Difficult doing work/chores   Not difficult at all Not difficult at all Not difficult at all     Diagnosis:  Adjustment disorder with mixed anxiety and depressed mood  Psychiatric Treatment: No.    Treatment Plan:  Client Abilities/Strengths Beth Rodgers is intelligent, self-aware, and motivated for change.   Support System: Husband, siblings, and friends.   Client Treatment Preferences Outpatient therapy.   Client Statement of Needs Beth Rodgers would like to manage interpersonal stressors with eldest son, interpersonal stressors with youngest child, boundaries for self and others, communicating with husband for end-of-life planning, managing symptoms proactively.   Treatment Level Weekly  Symptoms  Depression: feeling down, hypersomnia, lethargy.   (Status: maintained) Anxiety: Feeling nervous, difficulty managing worry, restlessness, irritability   (Status: maintained)  Goals:   Beth Rodgers experiences symptoms of depression and anxiety   Target Date: 02/03/24 Frequency: Weekly  Progress: 0 Modality: individual    Therapist will provide referrals for additional resources as appropriate.  Therapist will provide psycho-education regarding Devri's diagnosis and corresponding treatment approaches and interventions. Licensed Clinical Social Worker, [place], LCSW will  support the patient's ability to achieve the goals identified. will employ CBT, BA, Problem-solving, Solution Focused, Mindfulness,  coping skills, & other evidenced-based practices will be used to promote progress towards healthy functioning to help manage decrease symptoms associated with her diagnosis.   Reduce overall level, frequency, and intensity of the feelings of depression&  anxiety evidenced by  decreased overall symptoms from 6 to 7 days/week to 0 to 1 days/week per client report for at least 3 consecutive months. Verbally express understanding of the relationship between feelings of depression, anxiety and their impact on thinking patterns and behaviors. Verbalize an understanding of the role that distorted thinking plays in creating fears, excessive worry, and ruminations.    Beth Rodgers participated in the creation of the treatment plan)   Beth Irish, LCSW

## 2023-02-03 ENCOUNTER — Telehealth: Payer: Self-pay

## 2023-02-03 NOTE — Progress Notes (Cosign Needed Addendum)
Care Management & Coordination Services Pharmacy Team  Reason for Encounter: Appointment Reminder  Contacted patient to confirm telephone appointment with Charlene Brooke, PharmD ,  on 02/08/23 at 8:45. Spoke with patient on 02/03/2023   Do you have any problems getting your medications? No   What is your top health concern you would like to discuss at your upcoming visit? Patient continues to take metoprolol.  Have you seen any other providers since your last visit with PCP? No  Hospital visits:  None in previous 6 months   [facility] Rating Drugs:  Medication:  Last Fill: Day Supply Losartan '50mg'$  01/17/23 30   Care Gaps: Annual wellness visit in last year? Yes    Charlene Brooke, PharmD notified  Avel Sensor, [place] Assistant [phone]

## 2023-02-04 ENCOUNTER — Telehealth (INDEPENDENT_AMBULATORY_CARE_PROVIDER_SITE_OTHER): Payer: Self-pay | Admitting: Vascular Surgery

## 2023-02-04 NOTE — Telephone Encounter (Signed)
I spoke with Dr. Delana Meyer concerning the patient's wishes to have sclerotherapy performed on her RIGHT leg. Per Dr. Delana Meyer, patient will have to come in to speak with him regarding this. Originally, the patient was to be set up for laser ablation on her left leg with sclerotherapy following the laser procedure. Due to the fact of [place] being on hold with supplies for the laser ablation procedures, patient has called multiple times. The patient states (see previous message) that her RIGHT leg is hurting her worse that the left leg. She wishes to have sclerotherapy on the right leg while we are waiting on supplies to come in for the laser. Dr, Delana Meyer again informed me that the patient will have to come in to speak with her. The patient stated that "if he is saying that he doesn't remember the conversation about the right leg...". I did advise to the patient that I NEVER said that Dr. Delana Meyer said that. I explained that all I can do is what Dr. Delana Meyer advised me to tell her, but I certainly did not ever say that Dr. Delana Meyer does not remember a conversation. Patient did apologize and stated that she will come in to have the conversation with him regarding the sclerotherapy and laser ablation procedure. Appointment has been set up and patient acknowledged and states that she will be here.

## 2023-02-08 ENCOUNTER — Encounter: Payer: Medicare Other | Admitting: Pharmacist

## 2023-02-08 NOTE — Telephone Encounter (Addendum)
Looks like this has been addressed and pt is going to see Dr Delana Meyer to discuss concerns at upcoming appt. I agree with staying with this clinic/specialist who knows her best.

## 2023-02-08 NOTE — Progress Notes (Unsigned)
Care Management & Coordination Services Pharmacy Note  02/08/2023 Name:  Beth Rodgers MRN:  [id number] DOB:  06/24/1935  Summary: F/U visit -Reviewed medications; pt affirms adherence according to med list -Pt reports feeling unsteady on her feet and believes metoprolol is the cause; she plans to discuss with cardiologist at appt this month   Recommendations/Changes made from today's visit: -No med changes; encouraged to discuss metoprolol with cardiologist  Follow up plan: -[facility] will call patient *** -Pharmacist follow up televisit scheduled for *** -Vascular appt 02/18/23, heme/onc appt 04/30/23    Subjective: Beth Rodgers is an 88 y.o. year old female who is a primary patient of Ria Bush, MD.  The care coordination team was consulted for assistance with disease management and care coordination needs.    Engaged with patient by telephone for follow up visit.  Recent office visits: 12/24/22 Dr Lorelei Pont OV: neck pain  12/14/22 Dr Danise Mina OV: f/u - d/c oxybutynin (not started).  09/16/22 Dr Copland OV: shoulder pain - steroid injection.  09/07/22 Dr Danise Mina OV: slowing urinary stream - rx oxybutynin. Knee mass - suspected lipoma.   Recent consult visits: 01/14/23 Dr Delana Meyer (Vasc): f/u - rec laser ablation   01/01/23 Dr Hulan Fray (OB/GYN): postmenopausal bleeding - likely atrophy. Reassured.   11/09/22 Dr Delana Meyer (Vascular): venous insufficiency - no changes.  10/30/22 Dr [name] Banks (Heme/Onc): f/u breast cancer - no s/sx recurrence. Mammo due 11/2022.  08/26/22 Dr Rockey Situ (Cardiology): f/u - d/c metoprolol, start bisoprolol 5 mg.   Hospital visits: None in previous 6 months   Objective:  Lab Results  Component Value Date   CREATININE 0.68 06/19/2022   BUN 20 06/19/2022   GFR 79.59 06/19/2022   GFRNONAA >60 05/15/2022   GFRAA >60 08/21/2020   NA 138 06/19/2022   K 4.6 06/19/2022   CALCIUM 9.2 06/19/2022   CO2 27 06/19/2022   GLUCOSE 84 06/19/2022     Lab Results  Component Value Date/Time   HGBA1C 5.4 06/19/2022 09:12 AM   GFR 79.59 06/19/2022 09:12 AM   GFR 76.97 06/02/2021 12:27 PM    Last diabetic Eye exam: No results found for: "HMDIABEYEEXA"  Last diabetic Foot exam: No results found for: "HMDIABFOOTEX"   Lab Results  Component Value Date   CHOL 193 02/16/2020   HDL 77.30 02/16/2020   LDLCALC 104 (H) 02/16/2020   LDLDIRECT 124.9 08/31/2013   TRIG 59.0 02/16/2020   CHOLHDL 2 02/16/2020       Latest Ref Rng & Units 06/19/2022    9:12 AM 05/15/2022   12:31 PM 08/21/2020    4:25 PM  Hepatic Function  Total Protein 6.0 - 8.3 g/dL 6.5  7.2  7.4   Albumin 3.5 - 5.2 g/dL 3.9  4.0  4.3   AST 0 - 37 U/L 17  19  19   $ ALT 0 - 35 U/L 15  16  16   $ Alk Phosphatase 39 - 117 U/L 131  81  82   Total Bilirubin 0.2 - 1.2 mg/dL 0.6  0.5  0.4     Lab Results  Component Value Date/Time   TSH 4.68 06/19/2022 09:12 AM   TSH 2.44 06/02/2021 12:27 PM   FREET4 1.07 02/16/2020 11:28 AM       Latest Ref Rng & Units 06/19/2022    9:12 AM 05/15/2022   12:31 PM 06/02/2021   12:27 PM  CBC  WBC 4.0 - 10.5 K/uL 5.9  5.5  6.3   Hemoglobin 12.0 -  15.0 g/dL 12.2  13.6  13.9   Hematocrit 36.0 - 46.0 % 37.3  42.4  40.9   Platelets 150.0 - 400.0 K/uL 342.0  226  230.0     Lab Results  Component Value Date/Time   VD25OH 39.84 06/19/2022 09:12 AM   VD25OH 50.58 02/16/2020 11:28 AM   [id number] 02/16/2020 11:28 AM   VITAMINB12 902 08/12/2016 09:57 AM    Clinical ASCVD: No  The ASCVD Risk score (Arnett DK, et al., 2019) failed to calculate for the following reasons:   The 2019 ASCVD risk score is only valid for ages 90 to 73        02/02/2023    1:05 PM 06/22/2022   10:42 AM 06/19/2021   10:45 AM  Depression screen PHQ 2/9  Decreased Interest 0 0 0  Down, Depressed, Hopeless 1 0 0  PHQ - 2 Score 1 0 0  Altered sleeping 3  0  Tired, decreased energy 1  0  Change in appetite 0  0  Feeling bad or failure about yourself  0  0   Trouble concentrating 0  0  Moving slowly or fidgety/restless 0  0  Suicidal thoughts 0  0  PHQ-9 Score 5  0  Difficult doing work/chores   Not difficult at all     Social History   Tobacco Use  Smoking Status Never  Smokeless Tobacco Never   BP Readings from Last 3 Encounters:  01/14/23 (!) 181/90  01/01/23 (!) 150/90  12/24/22 110/68   Pulse Readings from Last 3 Encounters:  01/14/23 68  12/24/22 87  12/14/22 79   Wt Readings from Last 3 Encounters:  01/14/23 170 lb 12.8 oz (77.5 kg)  01/01/23 169 lb (76.7 kg)  12/24/22 172 lb 6 oz (78.2 kg)   BMI Readings from Last 3 Encounters:  01/14/23 27.57 kg/m  01/01/23 27.28 kg/m  12/24/22 27.82 kg/m    Allergies  Allergen Reactions   Anastrozole Other (See Comments), Itching and Rash    Back pain  Back pain  Back pain    Exemestane Rash    Pain in right hand  Pain in right hand  Pain in right hand    Naproxen Hives    Had bumps break out on the back  Had bumps break out on the back  Had bumps break out on the back  Had bumps break out on the back  Had bumps break out on the back     Medications Reviewed Today     Reviewed by Katha Cabal, MD (Physician) on 01/15/23 at 0753  Med List Status: <None>   Medication Order Taking? Sig Documenting Provider Last Dose Status Informant  alendronate (FOSAMAX) 70 MG tablet [id number] Yes Take 1 tablet (70 mg total) by mouth every Sunday. Ria Bush, MD Taking Active   aspirin EC 81 MG tablet [id number] Yes Take 81 mg by mouth in the morning and at bedtime. Swallow whole. [provider] Taking Active   Calcium Carb-Cholecalciferol (CALCIUM 600 + D PO) [id number] Yes Take 1 tablet by mouth daily. Ca 500 + Vit D 800 [provider] Taking Active Self  celecoxib (CELEBREX) 200 MG capsule [id number] Yes Take 200 mg by mouth daily. [provider] Taking Active   Cholecalciferol (VITAMIN D3) 25 MCG (1000 UT) CAPS [id number] Yes Take 1  capsule (1,000 Units total) by mouth daily. Ria Bush, MD Taking Active   ciclopirox (LOPROX) 0.77 % cream [id number] Yes Apply to feet,  around toenails, between toes once daily for fungus. [name], MD Taking Active   Ciclopirox 0.77 % gel [id number] Yes Apply 1 application  topically 2 (two) times daily as needed (fungus). [provider] Taking Active Self  diclofenac Sodium (VOLTAREN) 1 % GEL [id number] Yes Apply topically 3 (three) times daily as needed. [provider] Taking Active   latanoprost (XALATAN) 0.005 % ophthalmic solution [id number] Yes latanoprost 0.005 % eye drops  PLACE 1 DROP INTO BOTH EYES ONCE EVERY EVENING [provider] Taking Active   levothyroxine (SYNTHROID) 75 MCG tablet [id number] Yes Take 1 tablet (75 mcg total) by mouth daily. Ria Bush, MD Taking Active   losartan (COZAAR) 50 MG tablet [id number] Yes Take 1 tablet (50 mg total) by mouth in the morning and at bedtime. Minna Merritts, MD Taking Active   Magnesium 400 MG TABS [id number] Yes Take 400 mg by mouth daily. [provider] Taking Active Self  metoprolol tartrate (LOPRESSOR) 25 MG tablet [id number] Yes Take 25 mg by mouth daily. [provider] Taking Active   Multiple Vitamin (MULTIVITAMIN) tablet [id number] Yes Take 1 tablet by mouth daily. [provider] Taking Active            Med Note Jilda Roche A   Wed Jun 11, 2021  4:39 PM)    Roflumilast (ZORYVE) 0.3 % CREA [id number] Yes Apply 1 application. topically every other day. Sample [provider] Taking Active Self  SELENIUM PO [id number] Yes Take 1 tablet by mouth daily. [provider] Taking Active Self  silver sulfADIAZINE (SILVADENE) 1 % cream [id number] Yes Apply 1 application topically daily. Schnier, Dolores Lory, MD Taking Active   tacrolimus (PROTOPIC) 0.1 % ointment [id number] Yes APPLY TOPICALLY TO AFFECTED AREA(S) TWICE DAILY [name], MD Taking Active    vitamin C (ASCORBIC ACID) 500 MG tablet [id number] Yes Take 500 mg by mouth daily. [provider] Taking Active Self  vitamin E 180 MG (400 UNITS) capsule [id number] Yes Take 400 Units by mouth daily. [provider] Taking Active Self            SDOH:  (Social Determinants of Health) assessments and interventions performed: {yes/no:20286} SDOH Interventions    Flowsheet Row Counselor from confidential encounter on 02/02/2023 Clinical Support from 06/22/2022 in [facility] at [place] from 06/19/2021 in [facility] at [place] from 05/31/2020 in [facility] at [place] from 05/26/2019 in [facility] at [place] Interventions -- Intervention Not Indicated -- -- --  Housing Interventions -- Intervention Not Indicated -- -- --  Transportation Interventions -- Intervention Not Indicated -- -- --  Depression Interventions/Treatment  Counseling -- PHQ2-9 Score <4 Follow-up Not Indicated PHQ2-9 Score <4 Follow-up Not Indicated PHQ2-9 Score <4 Follow-up Not Indicated  Financial Strain Interventions -- Intervention Not Indicated -- -- --  Physical Activity Interventions -- Exercise Physiologist for Individual Counseling, Other (Comments)  [Currently doing PT due to knee replacement.] -- -- --  Stress Interventions -- Intervention Not Indicated -- -- --  Social Connections Interventions -- Intervention Not Indicated -- -- --       Medication Assistance: {MEDASSISTANCEINFO:25044}  Medication Access: Within the past 30 days, how often has patient missed a dose of medication? *** Is a pillbox or other method used to improve adherence? {YES/NO:21197} Factors that may affect medication adherence? {  CHL DESC; BARRIERS:21522} Are meds synced by current pharmacy? {YES/NO:21197} Are meds delivered by current  pharmacy? {YES/NO:21197} Does patient experience delays in picking up medications due to transportation concerns? {YES/NO:21197}  [facility] Reviewed: Is patient disadvantaged to use [facility]?: {YES/NO:21197} Current Rx insurance plan: *** Name and location of Current pharmacy:  [id number] [place], [place] [id number]: [phone]: [phone] [facility] services reviewed with patient today?: {YES/NO:21197} Patient requests to transfer care to [facility]?: {YES/NO:21197} Reason patient declined to change pharmacies: {US patient preference:27474}  Compliance/Adherence/Medication fill history: Care Gaps: DEXA (due 10/2022)  Star-Rating Drugs: Losartan - PDC 100%   Assessment/Plan  Atrial Fibrillation (Goal: prevent stroke and major bleeding) -Controlled - pt reports unsteadiness on her feet, she thinks due to metoprolol; she plans to discuss this with cardiologist at upcoming appt -New onset Afib 05/2022 s/p TKA; she worse Zio monitor for 30 days without recurrence of Afib -Current treatment: Metoprolol tartrate 25 mg - 1/2 tab BID - Appropriate, Effective, Query Safe Aspirin 81 mg daily -  Appropriate, Effective, Safe, Accessible -Medications previously tried: Eliquis (hematoma) -Recommended to continue current medication; advised to discuss possible side effects at upcoming cardiology appt  Hypertension (BP goal <140/90) -Controlled - pt endorses compliance with medication -Current treatment: Losartan 50 mg BID - Appropriate, Effective, Safe, Accessible Metoprolol tartrate 25 mg - 1/2 tab BID - Appropriate, Effective, Safe, Accessible -Educated on BP goals and benefits of medications for prevention of heart attack, stroke and kidney damage; Daily salt intake goal < 2300 mg; Importance of home blood pressure monitoring; -Counseled to monitor BP at home periodically -Recommended to  continue current medication  Hyperlipidemia: (LDL goal < 130) -Controlled without medication - given age and lack of ASCVD, no statin is indicated -Current treatment: None -Educated on Cholesterol goals;  -Counseled on diet and exercise extensively  Osteopenia (Goal prevent fractures) -Controlled - pt endorses compliance with alendronate -Hx breast cancer Fall 2021; was on exemestane for a few months -Last DEXA Scan: 10/31/20   T-Score total hip: -2.3  T-Score lumbar spine: -1.5  10-year probability of major osteoporotic fracture: 24.4%  10-year probability of hip fracture: 7.7% -Patient is a candidate for pharmacologic treatment due to T-Score -1.0 to -2.5 and 10-year risk of major osteoporotic fracture > 20% and T-Score -1.0 to -2.5 and 10-year risk of hip fracture > 3% -Current treatment  Alendronate 70 mg weekly Sundays (2013-2016, 2021-) - Appropriate, Effective, Safe, Accessible Calcium 600 mg w/ Vit D 800 - Appropriate, Effective, Safe, Accessible Vitamin D 2000 IU - Appropriate, Effective, Safe, Accessible -Recommend weight-bearing and muscle strengthening exercises for building and maintaining bone density. -Recommended to continue current medication  Hypothyroidism (Goal: maintain TSH in goal range) -Controlled - TSH is at goal; pt reports taking levothyroxine half hour before breakfast -Current treatment  Levothyroxine 75 mcg daily - Appropriate, Effective, Safe, Accessible -Recommended to continue current medication  Osteoarthritis (Goal: manage pain) -Controlled - pt is s/p TKA 05/2022; she has been taking higher dose of Celebrex (200 mg BID vs QD prior to surgery) -Current treatment  Voltaren 1% gel - Appropriate, Effective, Safe, Accessible Celecoxib 200 mg daily - Appropriate, Effective, Query Safe -Recommended to continue current medication  Health Maintenance -Vaccine gaps: Flu, Shingrix -Advised flu shot -Hx Breast cancer. Dx 07/2020 (2 masses). S/p Lumpectomy  08/2020. S/p Mastectomy 05/2021. No endocrine therapy (anastrozole, exemestane not tolerated)   LCharlene Brooke PharmD, BPara [place]  Clinical Pharmacist [facility] at [facility] [phone]

## 2023-02-08 NOTE — Telephone Encounter (Signed)
Care Management & Coordination Services Outreach Note  02/08/2023 Name: Beth Rodgers MRN: [id number] DOB: Mar 01, 1935  Referred by: Ria Bush, MD  Patient had a phone appointment scheduled with clinical pharmacist today.  An unsuccessful telephone outreach was attempted today. The patient was referred to the pharmacist for assistance with medications, care management and care coordination.   Patient will NOT be penalized in any way for missing a Care Management & Coordination Services appointment. The no-show fee does not apply.  If possible, a message was left to return call to: [phone] or to [facility].  Charlene Brooke, PharmD, BCACP Clinical Pharmacist [facility] at [facility] [phone]

## 2023-02-08 NOTE — Telephone Encounter (Signed)
Patient notified as instructed by telephone and verbalized understanding. Patient stated that she has an appointment scheduled next week with Dr. Delana Meyer but does not feel that she should wait that long. Patient was advised to call his office and see if they can see her sooner. Patient stated that she is going to call his office about a sooner appointment.

## 2023-02-16 ENCOUNTER — Ambulatory Visit (INDEPENDENT_AMBULATORY_CARE_PROVIDER_SITE_OTHER): Payer: Medicare Other | Admitting: Psychology

## 2023-02-16 DIAGNOSIS — F4323 Adjustment disorder with mixed anxiety and depressed mood: Secondary | ICD-10-CM

## 2023-02-16 NOTE — Progress Notes (Signed)
[facility] Counselor/Therapist Progress Note  Patient ID: Beth Rodgers, MRN: [id number]    Date: 02/16/2023  Time Spent: 1:03 pm - 1:51 pm : 48 Minutes  Treatment Type: Individual Therapy.  Reported Symptoms: Rumination, anxiety.    Mental Status Exam: Appearance:  NA     Behavior: Appropriate  Motor: NA  Speech/Language:  Clear and Coherent and Normal Rate  Affect: Congruent  Mood: anxious and dysthymic  Thought process: normal  Thought content:   WNL  Sensory/Perceptual disturbances:   WNL  Orientation: oriented to person, place, time/date, and situation  Attention: Good  Concentration: Good  Memory: WNL  Fund of knowledge:  Good  Insight:   Good  Judgment:  Good  Impulse Control: Good   Risk Assessment: Danger to Self:  No Self-injurious Behavior: No  Danger to Others: No Duty to Warn:no Physical Aggression / Violence:No  Access to Firearms a concern: No  Gang Involvement:No   Subjective:   Chrystie Rodgers participated from home, via phone, and consented to treatment. Therapist participated from home office. We met online due to [place] pandemic. Beth Rodgers reviewed the events of the past two weeks. She noted delayed sleep due to her husband's sleep and noted starting her day later than normal. She noted her son [name] leaving the home without notice. She noted [name]'s behavior, overall, reaffirming her need to visit her home country and focus on her visit. She noted frustration regarding her other son's behavior, lack of paying back a loan, and general lack of contact. She noted working on acceptance of her children's behavior but noted worry about her husband's current stress level in relation to the children's behaviors. She noted her efforts to focus on other areas of her life. She noted her goal of discussing equal access to financial accounts for end of life planning and emergency access. She noted her positive expectation that her husband will be amenable.  Therapist praised Beth Rodgers for her effort in the session and her effort to be more accepting of areas she has no say and focusing on areas of where she has say in a healthy way. Therapist validated and normalized Beth Rodgers's feelings and experience and provided supportive therapy.   Interventions: CBT & interpersonal.   Diagnosis:  Adjustment disorder with mixed anxiety and depressed mood  Psychiatric Treatment: No.    Treatment Plan:  Client Abilities/Strengths [name] is intelligent, self-aware, and motivated for change.   Support System: Husband, siblings, and friends.   Client Treatment Preferences Outpatient therapy.   Client Statement of Needs [name] would like to manage interpersonal stressors with eldest son, interpersonal stressors with youngest child, boundaries for self and others, communicating with husband for end-of-life planning, managing symptoms proactively.   Treatment Level Weekly  Symptoms  Depression: feeling down, hypersomnia, lethargy.   (Status: maintained) Anxiety: Feeling nervous, difficulty managing worry, restlessness, irritability   (Status: maintained)  Goals:   Beth Rodgers experiences symptoms of depression and anxiety   Target Date: 02/03/24 Frequency: Weekly  Progress: 0 Modality: individual    Therapist will provide referrals for additional resources as appropriate.  Therapist will provide psycho-education regarding Harmonee's diagnosis and corresponding treatment approaches and interventions. Licensed Clinical Social Worker, [place], LCSW will support the patient's ability to achieve the goals identified. will employ CBT, BA, Problem-solving, Solution Focused, Mindfulness,  coping skills, & other evidenced-based practices will be used to promote progress towards healthy functioning to help manage decrease symptoms associated with her diagnosis.   Reduce overall level, frequency,  and intensity of the feelings of depression&  anxiety evidenced by decreased  overall symptoms from 6 to 7 days/week to 0 to 1 days/week per client report for at least 3 consecutive months. Verbally express understanding of the relationship between feelings of depression, anxiety and their impact on thinking patterns and behaviors. Verbalize an understanding of the role that distorted thinking plays in creating fears, excessive worry, and ruminations.    Anda Kraft participated in the creation of the treatment plan)   [name], LCSW

## 2023-02-18 ENCOUNTER — Encounter (INDEPENDENT_AMBULATORY_CARE_PROVIDER_SITE_OTHER): Payer: Self-pay | Admitting: Vascular Surgery

## 2023-02-18 ENCOUNTER — Ambulatory Visit (INDEPENDENT_AMBULATORY_CARE_PROVIDER_SITE_OTHER): Payer: Medicare Other | Admitting: Vascular Surgery

## 2023-02-18 VITALS — BP 161/89 | HR 66 | Resp 16 | Wt 172.0 lb

## 2023-02-18 DIAGNOSIS — M159 Polyosteoarthritis, unspecified: Secondary | ICD-10-CM

## 2023-02-18 DIAGNOSIS — I10 Essential (primary) hypertension: Secondary | ICD-10-CM

## 2023-02-18 DIAGNOSIS — I83813 Varicose veins of bilateral lower extremities with pain: Secondary | ICD-10-CM

## 2023-02-18 DIAGNOSIS — I872 Venous insufficiency (chronic) (peripheral): Secondary | ICD-10-CM | POA: Diagnosis not present

## 2023-02-18 NOTE — Progress Notes (Signed)
MRN : [id number]  Beth Rodgers is a 88 y.o. (December 09, 1935) female who presents with chief complaint of varicose veins hurt.  History of Present Illness:   The patient returns to the office sooner than expected with concerns regarding the severity of her varicose veins.  She notes that on the right leg in particular her veins are more painful.  She also notes that they seem more prominent.  She is concerned regarding possible hemorrhage.  At the present time we are working her up for laser ablation of the left small saphenous vein.  At this point she feels that her right leg symptoms are significantly worse than her left and is requesting that we move forward with sclerotherapy of the right leg.  No outpatient medications have been marked as taking for the 02/18/23 encounter (Appointment) with Delana Meyer, Dolores Lory, MD.    Past Medical History:  Diagnosis Date   Basal cell carcinoma (Grandfather)    txted with MOHs in past   Cellulitis 08/10/2018   Chronic venous insufficiency    with varicose veins   Family history of breast cancer    Family history of thyroid cancer    GERD (gastroesophageal reflux disease)    Glaucoma    History  of basal cell carcinoma    left nare/BREAST CANCER   History of chicken pox    Hypertension    Hypoglycemia    Hypothyroid    Lymphedema    LE   Osteoarthritis    back pain, L knee pain   Osteopenia 06/2009   DEXA 11/2015: T -2.3 hip, -2.2 spine, on longterm bisphosphonate/evista   Psoriasis    Pyogenic granuloma 04/16/2016    Past Surgical History:  Procedure Laterality Date   BASAL CELL CARCINOMA EXCISION     located on face   BREAST BIOPSY Right    core done ing [place]?   BREAST BIOPSY Left 08/16/2020   3 area bx 4:00 X, IMC, 6:00Q IMC, LN hydro #3-benign   BREAST LUMPECTOMY Left 09/11/2020   two areas of [facility]   CATARACT EXTRACTION Bilateral    bilateral   DEXA  06/2009   T score Spine: -1.8, hip -2.0   EXCISION OF BREAST BIOPSY Left  09/11/2020   Procedure: EXCISION OF BREAST BIOPSY;  Surgeon: [name], MD;  Location: [facility] ORS;  Service: General;  Laterality: Left;   Foam sclerotherapy Bilateral 09/2015   Reed Pandy, [place]   KNEE ARTHROPLASTY Left 05/27/2022   Procedure: COMPUTER ASSISTED TOTAL KNEE ARTHROPLASTY;  Surgeon: Dereck Leep, MD;  Location: [facility] ORS;  Service: Orthopedics;  Laterality: Left;   MASTECTOMY W/ SENTINEL NODE BIOPSY Left 06/25/2021   Procedure: MASTECTOMY WITH SENTINEL LYMPH NODE BIOPSY;  Surgeon: [name], MD;  Location: [facility] ORS;  Service: General;  Laterality: Left;   NASAL RECONSTRUCTION  2005   for Omar L nare s/p Mohs   nuclear stress test  2014   normal in [place] BX Left 09/11/2020   Procedure: PARTIAL MASTECTOMY WITH NEEDLE LOCALIZATION AND AXILLARY SENTINEL LYMPH NODE BX;  Surgeon: [name], MD;  Location: [facility] ORS;  Service: General;  Laterality: Left;   RADIOFREQUENCY ABLATION Left 01/2012   remnant L G saphenous vein below knee   RADIOFREQUENCY ABLATION Right 02/2013   RLE vein ablation    VEIN LIGATION AND STRIPPING  remote   bilateral G saphenous veins    Social History  Social History   Tobacco Use   Smoking status: Never   Smokeless tobacco: Never  Vaping Use   Vaping Use: Never used  Substance Use Topics   Alcohol use: Yes    Comment: Occasionally drinks wine   Drug use: No    Family History Family History  Problem Relation Age of Onset   Cancer Mother        thyroid cancer   Heart disease Father        CHF   Diabetes Father    Glaucoma Father    Arthritis Father    Coronary artery disease Maternal Uncle    Bipolar disorder Son    Heart disease Sister        CHF   Stroke Sister    Brain cancer Grandson        astrocytoma   Breast cancer Cousin        dx 70s    Allergies  Allergen Reactions   Anastrozole Other (See Comments),  Itching and Rash    Back pain  Back pain  Back pain    Exemestane Rash    Pain in right hand  Pain in right hand  Pain in right hand    Naproxen Hives    Had bumps break out on the back  Had bumps break out on the back  Had bumps break out on the back  Had bumps break out on the back  Had bumps break out on the back      REVIEW OF SYSTEMS (Negative unless checked)  Constitutional: '[]'$ Weight loss  '[]'$ Fever  '[]'$ Chills Cardiac: '[]'$ Chest pain   '[]'$ Chest pressure   '[]'$ Palpitations   '[]'$ Shortness of breath when laying flat   '[]'$ Shortness of breath with exertion. Vascular:  '[]'$ Pain in legs with walking   '[x]'$ Pain in legs with standing  '[]'$ History of DVT   '[]'$ Phlebitis   '[]'$ Swelling in legs   '[x]'$ Varicose veins   '[]'$ Non-healing ulcers Pulmonary:   '[]'$ Uses home oxygen   '[]'$ Productive cough   '[]'$ Hemoptysis   '[]'$ Wheeze  '[]'$ COPD   '[]'$ Asthma Neurologic:  '[]'$ Dizziness   '[]'$ Seizures   '[]'$ History of stroke   '[]'$ History of TIA  '[]'$ Aphasia   '[]'$ Vissual changes   '[]'$ Weakness or numbness in arm   '[]'$ Weakness or numbness in leg Musculoskeletal:   '[]'$ Joint swelling   '[]'$ Joint pain   '[]'$ Low back pain Hematologic:  '[]'$ Easy bruising  '[]'$ Easy bleeding   '[]'$ Hypercoagulable state   '[]'$ Anemic Gastrointestinal:  '[]'$ Diarrhea   '[]'$ Vomiting  '[]'$ Gastroesophageal reflux/heartburn   '[]'$ Difficulty swallowing. Genitourinary:  '[]'$ Chronic kidney disease   '[]'$ Difficult urination  '[]'$ Frequent urination   '[]'$ Blood in urine Skin:  '[]'$ Rashes   '[]'$ Ulcers  Psychological:  '[]'$ History of anxiety   '[]'$  History of major depression.  Physical Examination  There were no vitals filed for this visit. There is no height or weight on file to calculate BMI. Gen: WD/WN, NAD Head: [place]/AT, No temporalis wasting.  Ear/Nose/Throat: Hearing grossly intact, nares w/o erythema or drainage, pinna without lesions Eyes: PER, EOMI, sclera nonicteric.  Neck: Supple, no gross masses.  No JVD.  Pulmonary:  Good air movement, no audible wheezing, no use of accessory muscles.  Cardiac: RRR,  precordium not hyperdynamic. Vascular:  Large varicosities present, greater than 10 mm bilateral.  Veins are tender to palpation  Mild venous stasis changes to the legs bilaterally.  Severe venous stasis dermatitis bilaterally; 1-2+ hrad non-pitting edema CEAP C3sEpAsPr Vessel Right Left  Radial Palpable Palpable  Gastrointestinal: soft, non-distended. No guarding/no peritoneal signs.  Musculoskeletal: M/S 5/5 throughout.  No deformity.  Neurologic: CN 2-12 intact. Pain and light touch intact in extremities.  Symmetrical.  Speech is fluent. Motor exam as listed above. Psychiatric: Judgment intact, Mood & affect appropriate for pt's clinical situation. Dermatologic: Venous rashes no ulcers noted.  No changes consistent with cellulitis. Lymph : No lichenification or skin changes of chronic lymphedema.  CBC Lab Results  Component Value Date   WBC 5.9 06/19/2022   HGB 12.2 06/19/2022   HCT 37.3 06/19/2022   MCV 88.8 06/19/2022   PLT 342.0 06/19/2022    BMET    Component Value Date/Time   NA 138 06/19/2022 0912   K 4.6 06/19/2022 0912   CL 102 06/19/2022 0912   CO2 27 06/19/2022 0912   GLUCOSE 84 06/19/2022 0912   BUN 20 06/19/2022 0912   CREATININE 0.68 06/19/2022 0912   CALCIUM 9.2 06/19/2022 0912   GFRNONAA >60 05/15/2022 1231   GFRAA >60 08/21/2020 1625   CrCl cannot be calculated (Patient's most recent lab result is older than the maximum 21 days allowed.).  COAG No results found for: "INR", "PROTIME"  Radiology No results found.   Assessment/Plan 1. Varicose veins of both lower extremities with pain Recommend  I have reviewed my previous  discussion with the patient regarding  varicose veins and why they cause symptoms. Patient will continue  wearing graduated compression stockings class 1 on a daily basis, beginning first thing in the morning and removing them in the evening.  The patient is CEAP C3sEpAsPr.  The patient has been wearing compression for more than 12  weeks with no or little benefit.  The patient has been exercising daily for more than 12 weeks. The patient has been elevating and taking OTC pain medications for more than 12 weeks.  None of these have have eliminated the pain related to the varicose veins and venous reflux or the discomfort regarding venous congestion.    In addition, behavioral modification including elevation during the day was again discussed and this will continue.  The patient has utilized over the counter pain medications and has been exercising.  However, at this time conservative therapy has not alleviated the patient's symptoms of leg pain and swelling  Recommend: laser ablation of the left short saphenous vein to eliminate the symptoms of pain and swelling of the lower extremities caused by the severe superficial venous reflux disease.   We will continue with our authorization for the laser ablation of the left small saphenous.  In the meantime we will move forward with authorization for sclerotherapy of the right lower extremity.  The patient has contacted her [employer]/Med[employer]are and has been told that this is medically necessary.  Which she does not seem to understand is the person that she spoke with is not guaranteeing payment in any way shape or form.  In fact my experience has been in this situation if we move forward without an authorization that she will be responsible for paying out-of-pocket which is approximately $575.  After lengthy discussion of greater than 40 minutes I have recommended that we move forward with authorization and should they give it to [place] we can move forward with sclerotherapy of the right lower extremity.  However until we have an authorization number contrary to which she has been told on the phone she would be billed out-of-pocket.  2. Chronic venous insufficiency Recommend  I have reviewed my previous  discussion with the patient regarding  varicose veins and why they cause  symptoms.  Patient will continue  wearing graduated compression stockings class 1 on a daily basis, beginning first thing in the morning and removing them in the evening.  The patient is CEAP C3sEpAsPr.  The patient has been wearing compression for more than 12 weeks with no or little benefit.  The patient has been exercising daily for more than 12 weeks. The patient has been elevating and taking OTC pain medications for more than 12 weeks.  None of these have have eliminated the pain related to the varicose veins and venous reflux or the discomfort regarding venous congestion.    In addition, behavioral modification including elevation during the day was again discussed and this will continue.  The patient has utilized over the counter pain medications and has been exercising.  However, at this time conservative therapy has not alleviated the patient's symptoms of leg pain and swelling  Recommend: laser ablation of the left short saphenous vein to eliminate the symptoms of pain and swelling of the lower extremities caused by the severe superficial venous reflux disease.   We will continue with our authorization for the laser ablation of the left small saphenous.  In the meantime we will move forward with authorization for sclerotherapy of the right lower extremity.  The patient has contacted her [employer]/Med[employer]are and has been told that this is medically necessary.  Which she does not seem to understand is the person that she spoke with is not guaranteeing payment in any way shape or form.  In fact my experience has been in this situation if we move forward without an authorization that she will be responsible for paying out-of-pocket which is approximately $575.  After lengthy discussion of greater than 40 minutes I have recommended that we move forward with authorization and should they give it to [place] we can move forward with sclerotherapy of the right lower extremity.  However until we have an authorization  number contrary to which she has been told on the phone she would be billed out-of-pocket.  3. Essential hypertension Continue antihypertensive medications as already ordered, these medications have been reviewed and there are no changes at this time.  4. Primary osteoarthritis involving multiple joints Continue NSAID medications as already ordered, these medications have been reviewed and there are no changes at this time.  Continued activity and therapy was stressed.    Hortencia Pilar, MD  02/18/2023 9:39 AM

## 2023-02-19 ENCOUNTER — Telehealth (INDEPENDENT_AMBULATORY_CARE_PROVIDER_SITE_OTHER): Payer: Self-pay | Admitting: Vascular Surgery

## 2023-02-19 NOTE — Telephone Encounter (Signed)
LVM for pt TCB and schedule appt\  right leg SALINE sclero - SEE GS ONLY - no auth req.

## 2023-02-20 ENCOUNTER — Encounter (INDEPENDENT_AMBULATORY_CARE_PROVIDER_SITE_OTHER): Payer: Self-pay | Admitting: Vascular Surgery

## 2023-02-23 IMAGING — DX DG WRIST COMPLETE 3+V*R*
4 series · 4 of 4 positions shown · non-contrast
Comparison: None.

CLINICAL DATA: Wrist pain, no known injury, initial encounter

EXAM:
RIGHT WRIST - COMPLETE 3+ VIEW

[wrist ap]
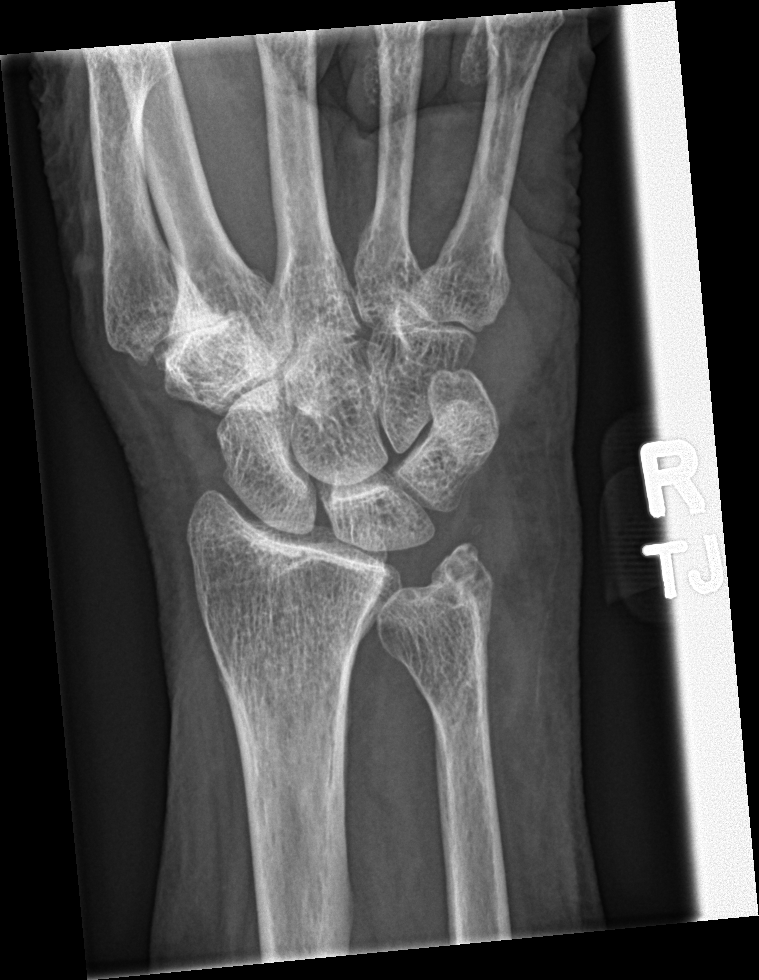

[wrist obl]
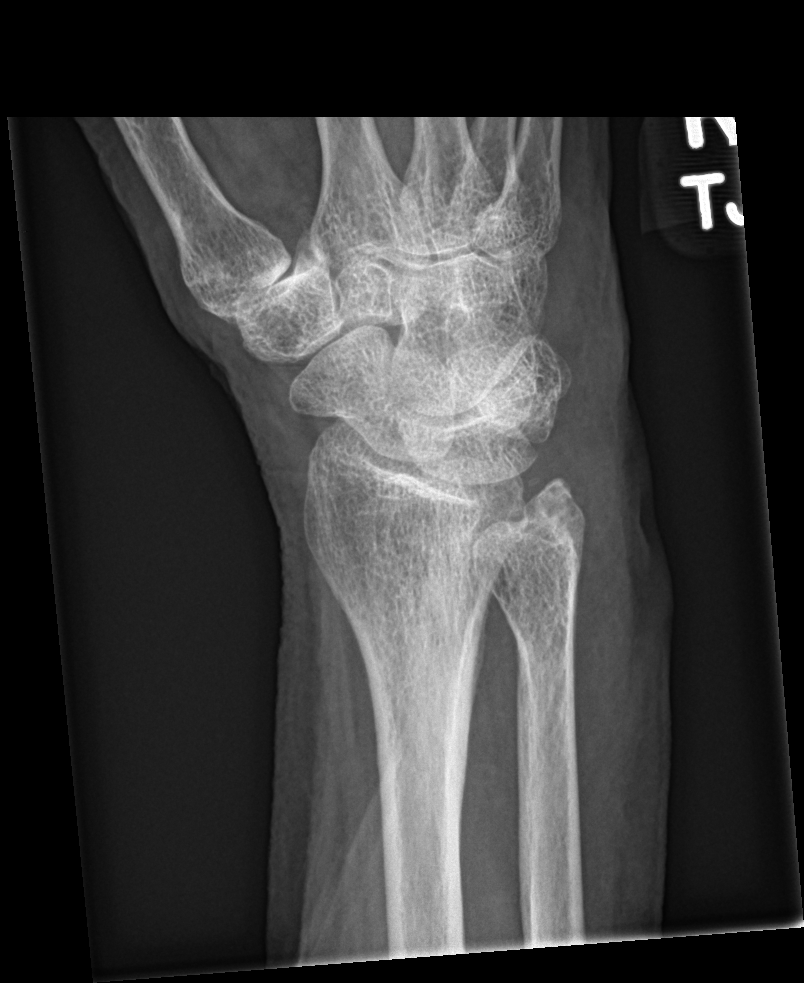

[wrist lat]
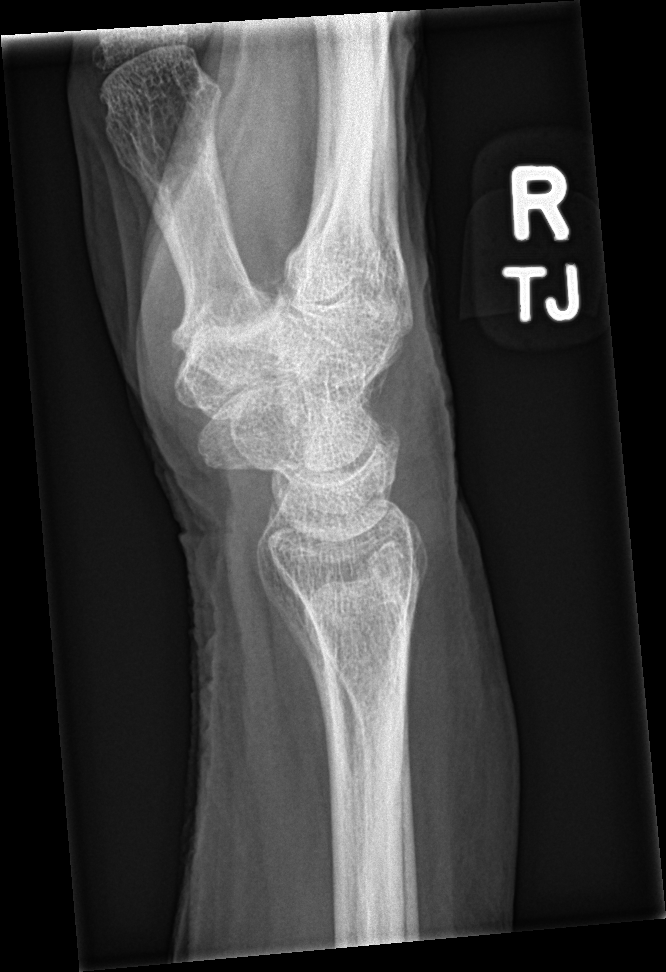

[wrist pa]
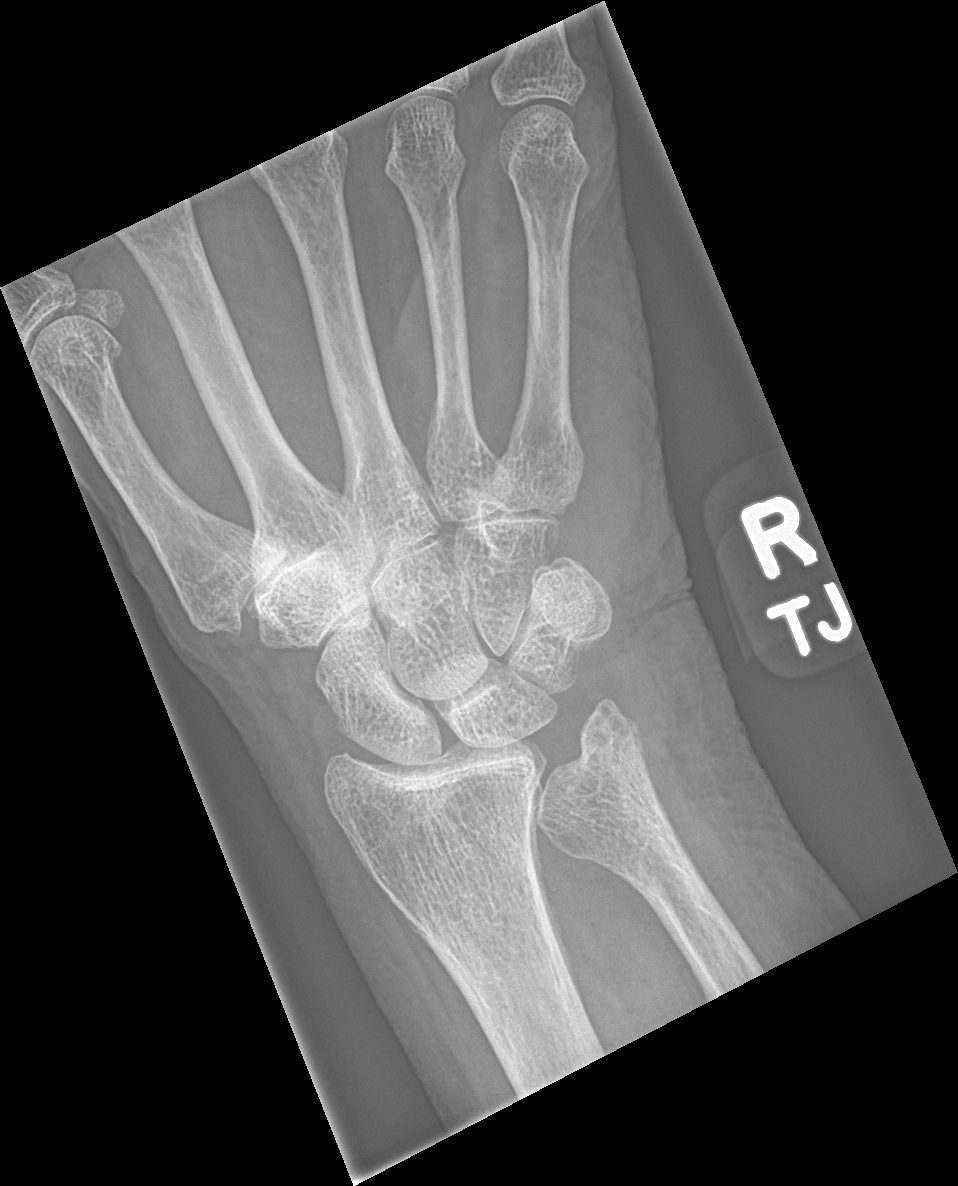

[4 of 4 positions shown; findings below may reference images not displayed]

FINDINGS: Degenerative changes are noted the first CMC joint. No acute
fracture or dislocation is noted. No soft tissue abnormality noted.
IMPRESSION: Degenerative change without acute abnormality.

## 2023-02-25 DIAGNOSIS — J302 Other seasonal allergic rhinitis: Secondary | ICD-10-CM | POA: Diagnosis not present

## 2023-02-25 DIAGNOSIS — H903 Sensorineural hearing loss, bilateral: Secondary | ICD-10-CM | POA: Diagnosis not present

## 2023-03-02 ENCOUNTER — Ambulatory Visit (INDEPENDENT_AMBULATORY_CARE_PROVIDER_SITE_OTHER): Payer: Medicare Other | Admitting: Psychology

## 2023-03-02 DIAGNOSIS — F4323 Adjustment disorder with mixed anxiety and depressed mood: Secondary | ICD-10-CM | POA: Diagnosis not present

## 2023-03-02 NOTE — Progress Notes (Signed)
[facility] Counselor/Therapist Progress Note  Patient ID: ZYRIANNA KOHN, MRN: [id number]    Date: 03/02/2023  Time Spent: 1:03 pm - 1:55 pm : 52  Minutes  Treatment Type: Individual Therapy.  Reported Symptoms: Rumination, anxiety.    Mental Status Exam: Appearance:  NA     Behavior: Appropriate  Motor: NA  Speech/Language:  Clear and Coherent and Normal Rate  Affect: Congruent  Mood: anxious and dysthymic  Thought process: normal  Thought content:   WNL  Sensory/Perceptual disturbances:   WNL  Orientation: oriented to person, place, time/date, and situation  Attention: Good  Concentration: Good  Memory: WNL  Fund of knowledge:  Good  Insight:   Good  Judgment:  Good  Impulse Control: Good   Risk Assessment: Danger to Self:  No Self-injurious Behavior: No  Danger to Others: No Duty to Warn:no Physical Aggression / Violence:No  Access to Firearms a concern: No  Gang Involvement:No   Subjective:   [name] participated from home, via phone, and consented to treatment. Therapist participated from home office. We met online due to [place] pandemic. Dimon reviewed the events of the past two weeks. Saline noted communicating with her husband that they need to increase their communication due to life stressors and become more a united front. She noted continued frustration regarding her son's lack of follow through with borrowed funds and discussed her frustration regarding his lack of contact and consistency with repayment. She noted her attempts to communicate with her son and discussed a history of no response. She noted her attempts to raise their children with specific values and morals. She noted her effort to work on acceptance in this space and work on focusing on her self-care and goals. Therapist praised this effort and encouraged Analuz to identify short-term and long-term goals to begin working towards, going forward.  Therapist validated and normalized Tekeyah's  feelings and experience and provided supportive therapy.   Interventions: CBT & interpersonal.   Diagnosis:  Adjustment disorder with mixed anxiety and depressed mood  Psychiatric Treatment: No.    Treatment Plan:  Client Abilities/Strengths Eevie is intelligent, self-aware, and motivated for change.   Support System: Husband, siblings, and friends.   Client Treatment Preferences Outpatient therapy.   Client Statement of Needs Dandra would like to manage interpersonal stressors with eldest son, interpersonal stressors with youngest child, boundaries for self and others, communicating with husband for end-of-life planning, managing symptoms proactively.   Treatment Level Weekly  Symptoms  Depression: feeling down, hypersomnia, lethargy.   (Status: maintained) Anxiety: Feeling nervous, difficulty managing worry, restlessness, irritability   (Status: maintained)  Goals:   Chasey experiences symptoms of depression and anxiety   Target Date: 02/03/24 Frequency: Weekly  Progress: 0 Modality: individual    Therapist will provide referrals for additional resources as appropriate.  Therapist will provide psycho-education regarding Jaree's diagnosis and corresponding treatment approaches and interventions. Licensed Clinical Social Worker, [place], LCSW will support the patient's ability to achieve the goals identified. will employ CBT, BA, Problem-solving, Solution Focused, Mindfulness,  coping skills, & other evidenced-based practices will be used to promote progress towards healthy functioning to help manage decrease symptoms associated with her diagnosis.   Reduce overall level, frequency, and intensity of the feelings of depression&  anxiety evidenced by decreased overall symptoms from 6 to 7 days/week to 0 to 1 days/week per client report for at least 3 consecutive months. Verbally express understanding of the relationship between feelings of depression, anxiety and  their impact  on thinking patterns and behaviors. Verbalize an understanding of the role that distorted thinking plays in creating fears, excessive worry, and ruminations.    Anda Kraft participated in the creation of the treatment plan)   [name], LCSW

## 2023-03-03 ENCOUNTER — Telehealth (INDEPENDENT_AMBULATORY_CARE_PROVIDER_SITE_OTHER): Payer: Self-pay

## 2023-03-03 NOTE — Telephone Encounter (Signed)
Beth Rodgers, returned Tanya's call this morning wanting to know why her sclerotherapy appt for Monday had been changed to saline when she had discussed foam with Dr. Ronalee Belts. She has had foam in the past with no side effects and a better outcome. She is wanting to know why it was changed.  Please advise and return her call

## 2023-03-03 NOTE — Telephone Encounter (Signed)
Spoke to patient earlier this morning. She states that Dr. Delana Meyer told her that she could have FOAM sclero vs. [place]. I changed her appt to accommodate this and scheduled her with Dr. Delana Meyer. I also advised that FOAM is used to treat the larger veins and the saline is for the smaller (per docs). I advised that she would have to talk to Dr. Delana Meyer the day of her appt, that I can't make that decision.

## 2023-03-04 DIAGNOSIS — H401123 Primary open-angle glaucoma, left eye, severe stage: Secondary | ICD-10-CM | POA: Diagnosis not present

## 2023-03-08 ENCOUNTER — Ambulatory Visit (INDEPENDENT_AMBULATORY_CARE_PROVIDER_SITE_OTHER): Payer: Medicare Other | Admitting: Nurse Practitioner

## 2023-03-10 NOTE — Progress Notes (Signed)
Beth Rodgers is a 88 y.o.female who presents with painful varicose veins of the right leg  Past Medical History:  Diagnosis Date   Basal cell carcinoma (BCC)    txted with MOHs in past   Cellulitis 08/10/2018   Chronic venous insufficiency    with varicose veins   Family history of breast cancer    Family history of thyroid cancer    GERD (gastroesophageal reflux disease)    Glaucoma    History  of basal cell carcinoma    left nare/BREAST CANCER   History of chicken pox    Hypertension    Hypoglycemia    Hypothyroid    Lymphedema    LE   Osteoarthritis    back pain, L knee pain   Osteopenia 06/2009   DEXA 11/2015: T -2.3 hip, -2.2 spine, on longterm bisphosphonate/evista   Psoriasis    Pyogenic granuloma 04/16/2016    Past Surgical History:  Procedure Laterality Date   BASAL CELL CARCINOMA EXCISION     located on face   BREAST BIOPSY Right    core done ing [place]?   BREAST BIOPSY Left 08/16/2020   3 area bx 4:00 X, IMC, 6:00Q IMC, LN hydro #3-benign   BREAST LUMPECTOMY Left 09/11/2020   two areas of [facility]   CATARACT EXTRACTION Bilateral    bilateral   DEXA  06/2009   T score Spine: -1.8, hip -2.0   EXCISION OF BREAST BIOPSY Left 09/11/2020   Procedure: EXCISION OF BREAST BIOPSY;  Surgeon: [name], MD;  Location: [facility] ORS;  Service: General;  Laterality: Left;   Foam sclerotherapy Bilateral 09/2015   Reed Pandy, [place]   KNEE ARTHROPLASTY Left 05/27/2022   Procedure: COMPUTER ASSISTED TOTAL KNEE ARTHROPLASTY;  Surgeon: Dereck Leep, MD;  Location: [facility] ORS;  Service: Orthopedics;  Laterality: Left;   MASTECTOMY W/ SENTINEL NODE BIOPSY Left 06/25/2021   Procedure: MASTECTOMY WITH SENTINEL LYMPH NODE BIOPSY;  Surgeon: [name], MD;  Location: [facility] ORS;  Service: General;  Laterality: Left;   NASAL RECONSTRUCTION  2005   for Solomon L nare s/p Mohs   nuclear stress test  2014   normal in [place] BX Left 09/11/2020   Procedure: PARTIAL MASTECTOMY WITH NEEDLE LOCALIZATION AND AXILLARY SENTINEL LYMPH NODE BX;  Surgeon: [name], MD;  Location: [facility] ORS;  Service: General;  Laterality: Left;   RADIOFREQUENCY ABLATION Left 01/2012   remnant L G saphenous vein below knee   RADIOFREQUENCY ABLATION Right 02/2013   RLE vein ablation    VEIN LIGATION AND STRIPPING  remote   bilateral G saphenous veins    Current Outpatient Medications  Medication Sig Dispense Refill   alendronate (FOSAMAX) 70 MG tablet Take 1 tablet (70 mg total) by mouth every Sunday. 12 tablet 3   aspirin EC 81 MG tablet Take 81 mg by mouth in the morning and at bedtime. Swallow whole.     Calcium Carb-Cholecalciferol (CALCIUM 600 + D PO) Take 1 tablet by mouth daily. Ca 500 + Vit D 800     celecoxib (CELEBREX) 200 MG capsule Take 200 mg by mouth daily.     Cholecalciferol (VITAMIN D3) 25 MCG (1000 UT) CAPS Take 1 capsule (1,000 Units total) by mouth daily. 30 capsule    ciclopirox (LOPROX) 0.77 % cream Apply to feet, around toenails, between toes once daily for fungus. 90 g 2   Ciclopirox 0.77 % gel  Apply 1 application  topically 2 (two) times daily as needed (fungus).     diclofenac Sodium (VOLTAREN) 1 % GEL Apply topically 3 (three) times daily as needed.     latanoprost (XALATAN) 0.005 % ophthalmic solution latanoprost 0.005 % eye drops  PLACE 1 DROP INTO BOTH EYES ONCE EVERY EVENING     levothyroxine (SYNTHROID) 75 MCG tablet Take 1 tablet (75 mcg total) by mouth daily. 90 tablet 3   losartan (COZAAR) 50 MG tablet Take 1 tablet (50 mg total) by mouth in the morning and at bedtime. 60 tablet 5   Magnesium 400 MG TABS Take 400 mg by mouth daily.     metoprolol tartrate (LOPRESSOR) 25 MG tablet Take 25 mg by mouth daily.     Multiple Vitamin (MULTIVITAMIN) tablet Take 1 tablet by mouth daily.     Roflumilast (ZORYVE) 0.3 % CREA Apply 1 application. topically  every other day. Sample     SELENIUM PO Take 1 tablet by mouth daily.     silver sulfADIAZINE (SILVADENE) 1 % cream APPLY 1 APPLICATION TOPICALLY DAILY 85 g 2   tacrolimus (PROTOPIC) 0.1 % ointment APPLY TOPICALLY TO AFFECTED AREA(S) TWICE DAILY 60 g 2   vitamin C (ASCORBIC ACID) 500 MG tablet Take 500 mg by mouth daily.     vitamin E 180 MG (400 UNITS) capsule Take 400 Units by mouth daily.     No current facility-administered medications for this visit.    Allergies  Allergen Reactions   Anastrozole Other (See Comments), Itching and Rash    Back pain  Back pain  Back pain    Exemestane Rash    Pain in right hand  Pain in right hand  Pain in right hand    Naproxen Hives    Had bumps break out on the back  Had bumps break out on the back  Had bumps break out on the back  Had bumps break out on the back  Had bumps break out on the back     Indication: Patient presents with symptomatic varicose veins of the right lower extremity.  Procedure: Foam sclerotherapy was performed on the right lower extremity. Using ultrasound guidance, 5 mL of foam Sotradecol was used to inject the varicosities of the right lower extremity. Compression wraps were placed. The patient tolerated the procedure well.

## 2023-03-11 ENCOUNTER — Telehealth: Payer: Self-pay | Admitting: Family Medicine

## 2023-03-11 ENCOUNTER — Ambulatory Visit (INDEPENDENT_AMBULATORY_CARE_PROVIDER_SITE_OTHER): Payer: Medicare Other | Admitting: Vascular Surgery

## 2023-03-11 VITALS — BP 155/82 | HR 64 | Resp 16 | Ht 66.0 in | Wt 176.0 lb

## 2023-03-11 DIAGNOSIS — I83813 Varicose veins of bilateral lower extremities with pain: Secondary | ICD-10-CM | POA: Diagnosis not present

## 2023-03-11 DIAGNOSIS — F4321 Adjustment disorder with depressed mood: Secondary | ICD-10-CM

## 2023-03-11 NOTE — Telephone Encounter (Signed)
Noted. New referral placed to [facility] - will see if [facility] or [place] can see her.

## 2023-03-11 NOTE — Telephone Encounter (Signed)
Has been meeting with Dr. Ceasar Lund via phn visits during Covid. She was recently informed her insurance will no longer cover phn visits, only video or face-to-face. However, Dr. Ceasar Lund is located in [place], which pt states is too difficult for her to travel regularly. Pt request referral to another [facility] therapist closer to [place] area. Plz advise.

## 2023-03-11 NOTE — Addendum Note (Signed)
Addended by: Ria Bush on: 03/11/2023 08:55 PM   Modules accepted: Orders

## 2023-03-11 NOTE — Telephone Encounter (Signed)
Pt called requesting a call back from [place]. Pt states she has some questions/concerns regarding her physiatrist. Pt stated she might need a referral to a new physiatrist due to traveling reasons. Call back # [phone]

## 2023-03-12 NOTE — Telephone Encounter (Signed)
Referral has been sent to [facility] for scheduling

## 2023-03-13 ENCOUNTER — Encounter (INDEPENDENT_AMBULATORY_CARE_PROVIDER_SITE_OTHER): Payer: Self-pay | Admitting: Vascular Surgery

## 2023-03-16 ENCOUNTER — Ambulatory Visit (INDEPENDENT_AMBULATORY_CARE_PROVIDER_SITE_OTHER): Payer: Medicare Other | Admitting: Psychology

## 2023-03-16 DIAGNOSIS — F4323 Adjustment disorder with mixed anxiety and depressed mood: Secondary | ICD-10-CM
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
## 2023-03-29 ENCOUNTER — Ambulatory Visit: Payer: Medicare Other | Admitting: Clinical

## 2023-03-30 ENCOUNTER — Ambulatory Visit: Payer: Medicare Other | Admitting: Psychology

## 2023-04-01 ENCOUNTER — Telehealth (INDEPENDENT_AMBULATORY_CARE_PROVIDER_SITE_OTHER): Payer: Self-pay | Admitting: Vascular Surgery

## 2023-04-01 ENCOUNTER — Other Ambulatory Visit: Payer: Self-pay | Admitting: Family Medicine

## 2023-04-01 NOTE — Telephone Encounter (Signed)
Patient called stating she is in the beginning of having a small ulcer in her leg and needing an appointment.  Please advise.

## 2023-04-01 NOTE — Telephone Encounter (Signed)
Bring her in Monday to see GS

## 2023-04-02 ENCOUNTER — Telehealth: Payer: Self-pay | Admitting: Family Medicine

## 2023-04-02 ENCOUNTER — Other Ambulatory Visit: Payer: Self-pay | Admitting: Family Medicine

## 2023-04-02 NOTE — Telephone Encounter (Signed)
Dr. Sharen Hones has sent a referral for this in the past, patient is requesting to be seen elsewhere.   Reason for Referral Request:  Wants to go to different office   Has patient been seen PCP for this complaint? Yes, in past appointment   No,  please schedule patient for appointment for complaint.  Patient scheduled on:   Yes, please find out following information.  Referral for which specialty: Vascular Surgery   Preferred office/provider: No pref, [place] area

## 2023-04-02 NOTE — Telephone Encounter (Signed)
I do not see any previous vascular orders or referrals placed by Dr Sharen Hones, so this is not something that I can help with at this time.   Please advise Dr Sharen Hones, thanks.

## 2023-04-05 ENCOUNTER — Ambulatory Visit (INDEPENDENT_AMBULATORY_CARE_PROVIDER_SITE_OTHER): Payer: Medicare Other | Admitting: Vascular Surgery

## 2023-04-05 ENCOUNTER — Encounter (INDEPENDENT_AMBULATORY_CARE_PROVIDER_SITE_OTHER): Payer: Self-pay | Admitting: Vascular Surgery

## 2023-04-05 VITALS — BP 150/81 | HR 58 | Resp 18 | Ht 65.0 in | Wt 172.0 lb

## 2023-04-05 DIAGNOSIS — I872 Venous insufficiency (chronic) (peripheral): Secondary | ICD-10-CM | POA: Diagnosis not present

## 2023-04-05 DIAGNOSIS — I10 Essential (primary) hypertension: Secondary | ICD-10-CM

## 2023-04-05 DIAGNOSIS — M159 Polyosteoarthritis, unspecified: Secondary | ICD-10-CM

## 2023-04-05 DIAGNOSIS — L97319 Non-pressure chronic ulcer of right ankle with unspecified severity: Secondary | ICD-10-CM | POA: Diagnosis not present

## 2023-04-05 DIAGNOSIS — I83013 Varicose veins of right lower extremity with ulcer of ankle: Secondary | ICD-10-CM

## 2023-04-07 ENCOUNTER — Encounter (INDEPENDENT_AMBULATORY_CARE_PROVIDER_SITE_OTHER): Payer: Self-pay | Admitting: Vascular Surgery

## 2023-04-07 DIAGNOSIS — I83013 Varicose veins of right lower extremity with ulcer of ankle: Secondary | ICD-10-CM | POA: Insufficient documentation

## 2023-04-07 NOTE — Progress Notes (Signed)
MRN : [id number]  Beth Rodgers is a 88 y.o. (1935-01-03) female who presents with chief complaint of legs hurt and swell.  History of Present Illness:   Patient is seen for evaluation of leg pain and swelling associated with new onset ulceration. The patient first noticed the venous problems remotely and she has been under our care for her varicose veins and venous stasis dermatitis for many years. The patient has also noted a progressive worsening of the discoloration in the ankle and shin area.   The patient notes that an ulcer has developed acutely without specific trauma and since it occurred it has been very slow to heal.  There is a mild amount of drainage associated with the open area.  The wound is also very painful.  The patient denies claudication symptoms or rest pain symptoms.   The patient has not had any past angiography, interventions or vascular surgery.  Elevation makes the leg symptoms better, dependency makes them much worse. The patient denies any recent changes in medications.  The patient has been wearing graduated compression.  The patient denies a history of DVT or PE. There is no prior history of phlebitis. There is no history of primary lymphedema.  No history of malignancies. No history of trauma or groin or pelvic surgery. There is no history of radiation treatment to the groin or pelvis       Current Meds  Medication Sig   alendronate (FOSAMAX) 70 MG tablet Take 1 tablet (70 mg total) by mouth every Sunday.   aspirin EC 81 MG tablet Take 81 mg by mouth in the morning and at bedtime. Swallow whole.   Calcium Carb-Cholecalciferol (CALCIUM 600 + D PO) Take 1 tablet by mouth daily. Ca 500 + Vit D 800   celecoxib (CELEBREX) 200 MG capsule Take 200 mg by mouth daily.   Cholecalciferol (VITAMIN D3) 25 MCG (1000 UT) CAPS Take 1 capsule (1,000 Units total) by mouth daily.   ciclopirox (LOPROX) 0.77 % cream Apply to feet, around toenails, between  toes once daily for fungus.   Ciclopirox 0.77 % gel Apply 1 application  topically 2 (two) times daily as needed (fungus).   diclofenac Sodium (VOLTAREN) 1 % GEL Apply topically 3 (three) times daily as needed.   latanoprost (XALATAN) 0.005 % ophthalmic solution latanoprost 0.005 % eye drops  PLACE 1 DROP INTO BOTH EYES ONCE EVERY EVENING   levothyroxine (SYNTHROID) 75 MCG tablet Take 1 tablet (75 mcg total) by mouth daily.   losartan (COZAAR) 50 MG tablet Take 1 tablet (50 mg total) by mouth in the morning and at bedtime.   Magnesium 400 MG TABS Take 400 mg by mouth daily.   metoprolol tartrate (LOPRESSOR) 25 MG tablet TAKE 1/2 TABLET (12.5MG ) BY MOUTH 2 TIMES DAILY.   Multiple Vitamin (MULTIVITAMIN) tablet Take 1 tablet by mouth daily.   Roflumilast (ZORYVE) 0.3 % CREA Apply 1 application. topically every other day. Sample   SELENIUM PO Take 1 tablet by mouth daily.   silver sulfADIAZINE (SILVADENE) 1 % cream APPLY 1 APPLICATION TOPICALLY DAILY   tacrolimus (PROTOPIC) 0.1 % ointment APPLY TOPICALLY TO AFFECTED AREA(S) TWICE DAILY   vitamin C (ASCORBIC ACID) 500 MG tablet Take 500 mg by mouth daily.   vitamin E 180 MG (400 UNITS) capsule Take 400 Units by mouth daily.    Past Medical History:  Diagnosis Date   Basal cell carcinoma (BCC)  txted with MOHs in past   Cellulitis 08/10/2018   Chronic venous insufficiency    with varicose veins   Family history of breast cancer    Family history of thyroid cancer    GERD (gastroesophageal reflux disease)    Glaucoma    History  of basal cell carcinoma    left nare/BREAST CANCER   History of chicken pox    Hypertension    Hypoglycemia    Hypothyroid    Lymphedema    LE   Osteoarthritis    back pain, L knee pain   Osteopenia 06/2009   DEXA 11/2015: T -2.3 hip, -2.2 spine, on longterm bisphosphonate/evista   Psoriasis    Pyogenic granuloma 04/16/2016    Past Surgical History:  Procedure Laterality Date   BASAL CELL CARCINOMA  EXCISION     located on face   BREAST BIOPSY Right    core done ing [place] 2000?   BREAST BIOPSY Left 08/16/2020   3 area bx 4:00 X, IMC, 6:00Q IMC, LN hydro #3-benign   BREAST LUMPECTOMY Left 09/11/2020   two areas of [facility]   CATARACT EXTRACTION Bilateral    bilateral   DEXA  06/2009   T score Spine: -1.8, hip -2.0   EXCISION OF BREAST BIOPSY Left 09/11/2020   Procedure: EXCISION OF BREAST BIOPSY;  Surgeon: Carolan Shiver, MD;  Location: [facility] ORS;  Service: General;  Laterality: Left;   Foam sclerotherapy Bilateral 09/2015   Jerolyn Shin, [place]   KNEE ARTHROPLASTY Left 05/27/2022   Procedure: COMPUTER ASSISTED TOTAL KNEE ARTHROPLASTY;  Surgeon: Donato Heinz, MD;  Location: [facility] OR[facility];  Service: Orthopedics;  Laterality: Left;   MASTECTOMY W/ SENTINEL NODE BIOPSY Left 06/25/2021   Procedure: MASTECTOMY WITH SENTINEL LYMPH NODE BIOPSY;  Surgeon: Carolan Shiver, MD;  Location: [facility] OR[facility];  Service: General;  Laterality: Left;   NASAL RECONSTRUCTION  2005   for BCC L nare s/p Mohs   nuclear stress test  2014   normal in [place]   PARTIAL MASTECTOMY WITH NEEDLE LOCALIZATION AND AXILLARY SENTINEL LYMPH NODE BX Left 09/11/2020   Procedure: PARTIAL MASTECTOMY WITH NEEDLE LOCALIZATION AND AXILLARY SENTINEL LYMPH NODE BX;  Surgeon: Carolan Shiver, MD;  Location: [facility];  Service: General;  Laterality: Left;   RADIOFREQUENCY ABLATION Left 01/2012   remnant L G saphenous vein below knee   RADIOFREQUENCY ABLATION Right 02/2013   RLE vein ablation    VEIN LIGATION AND STRIPPING  remote   bilateral G saphenous veins    Social History Social History   Tobacco Use   Smoking status: Never   Smokeless tobacco: Never  Vaping Use   Vaping Use: Never used  Substance Use Topics   Alcohol use: Yes    Comment: Occasionally drinks wine   Drug use: No    Family History Family History  Problem Relation Age of Onset   Cancer Mother        thyroid cancer   Heart disease  Father        CHF   Diabetes Father    Glaucoma Father    Arthritis Father    Coronary artery disease Maternal Uncle    Bipolar disorder Son    Heart disease Sister        CHF   Stroke Sister    Brain cancer Grandson        astrocytoma   Breast cancer Cousin        dx 74s    Allergies  Allergen Reactions  Anastrozole Other (See Comments), Itching and Rash    Back pain  Back pain  Back pain    Exemestane Rash    Pain in right hand  Pain in right hand  Pain in right hand    Naproxen Hives    Had bumps break out on the back  Had bumps break out on the back  Had bumps break out on the back  Had bumps break out on the back  Had bumps break out on the back      REVIEW OF SYSTEMS (Negative unless checked)  Constitutional: [] Weight loss  [] Fever  [] Chills Cardiac: [] Chest pain   [] Chest pressure   [] Palpitations   [] Shortness of breath when laying flat   [] Shortness of breath with exertion. Vascular:  [] Pain in legs with walking   [x] Pain in legs at rest  [] History of DVT   [] Phlebitis   [x] Swelling in legs   [] Varicose veins   [] Non-healing ulcers Pulmonary:   [] Uses home oxygen   [] Productive cough   [] Hemoptysis   [] Wheeze  [] COPD   [] Asthma Neurologic:  [] Dizziness   [] Seizures   [] History of stroke   [] History of TIA  [] Aphasia   [] Vissual changes   [] Weakness or numbness in arm   [] Weakness or numbness in leg Musculoskeletal:   [] Joint swelling   [] Joint pain   [] Low back pain Hematologic:  [] Easy bruising  [] Easy bleeding   [] Hypercoagulable state   [] Anemic Gastrointestinal:  [] Diarrhea   [] Vomiting  [] Gastroesophageal reflux/heartburn   [] Difficulty swallowing. Genitourinary:  [] Chronic kidney disease   [] Difficult urination  [] Frequent urination   [] Blood in urine Skin:  [] Rashes   [] Ulcers  Psychological:  [] History of anxiety   []  History of major depression.  Physical Examination  Vitals:   04/05/23 1526 04/05/23 1612  BP: (!) 167/95 (!) 150/81  Pulse: 62  (!) 58  Resp: 18 18  Weight: 172 lb (78 kg)   Height: 5\' 5"  (1.651 m)    Body mass index is 28.62 kg/m. Gen: WD/WN, NAD Head: [place]/AT, No temporalis wasting.  Ear/Nose/Throat: Hearing grossly intact, nares w/o erythema or drainage, pinna without lesions Eyes: PER, EOMI, sclera nonicteric.  Neck: Supple, no gross masses.  No JVD.  Pulmonary:  Good air movement, no audible wheezing, no use of accessory muscles.  Cardiac: RRR, precordium not hyperdynamic. Vascular:  2-3+ edema of the right leg with severe venous changes of the right leg.  Venous ulcer noted in the ankle area on the right, noninfected.  CEAP [id number]   Vessel Right Left  Radial Palpable Palpable  Gastrointestinal: soft, non-distended. No guarding/no peritoneal signs.  Musculoskeletal: M/S 5/5 throughout.  No deformity.  Neurologic: CN 2-12 intact. Pain and light touch intact in extremities.  Symmetrical.  Speech is fluent. Motor exam as listed above. Psychiatric: Judgment intact, Mood & affect appropriate for pt's clinical situation. Dermatologic: Venous stasis dermatitis with ulcers present on the right.  No changes consistent with cellulitis. Lymph : No lichenification or skin changes of chronic lymphedema.  CBC Lab Results  Component Value Date   WBC 5.9 06/19/2022   HGB 12.2 06/19/2022   HCT 37.3 06/19/2022   MCV 88.8 06/19/2022   PLT 342.0 06/19/2022    BMET    Component Value Date/Time   NA 138 06/19/2022 0912   K 4.6 06/19/2022 0912   CL 102 06/19/2022 0912   CO2 27 06/19/2022 0912   GLUCOSE 84 06/19/2022 0912   BUN 20 06/19/2022 0912  CREATININE 0.68 06/19/2022 0912   CALCIUM 9.2 06/19/2022 0912   GFRNONAA >60 05/15/2022 1231   GFRAA >60 08/21/2020 1625   CrCl cannot be calculated (Patient's most recent lab result is older than the maximum 21 days allowed.).  COAG No results found for: "INR", "PROTIME"  Radiology No results found.   Assessment/Plan 1. Venous ulcer of ankle, right No  surgery or intervention at this point in time.    I have had a long discussion with the patient regarding venous insufficiency and why it  causes symptoms, specifically venous ulceration. I have discussed with the patient the chronic skin changes that accompany venous insufficiency and the long term sequela such as infection and recurring  ulceration.  I recommended an [employer] [facility] but the patient has not had success with this in the past and is been utilizing her own compression wrap in association with her usual compression stocking.  In addition, behavioral modification including several periods of elevation of the lower extremities during the day will be continued. Achieving a position with the ankles at heart level was stressed to the patient  The patient is instructed to begin routine exercise, especially walking on a daily basis  She will continue to use her lymph pump as tolerated.  She will follow-up with me in 1 week.  2. Chronic venous insufficiency See #1  3. Essential hypertension Continue antihypertensive medications as already ordered, these medications have been reviewed and there are no changes at this time.  4. Primary osteoarthritis involving multiple joints Continue NSAID medications as already ordered, these medications have been reviewed and there are no changes at this time.  Continued activity and therapy was stressed.      Levora Dredge, MD  04/07/2023 11:15 AM

## 2023-04-09 NOTE — Telephone Encounter (Addendum)
She saw [facility] on Monday 4/8 for venous ulcer. I'm not aware of another vascular surgery practice in [place] area. Another option would be to go to [facility] but if she had a good visit would suggest staying with [facility].

## 2023-04-12 ENCOUNTER — Ambulatory Visit (INDEPENDENT_AMBULATORY_CARE_PROVIDER_SITE_OTHER): Payer: Medicare Other | Admitting: Vascular Surgery

## 2023-04-12 ENCOUNTER — Encounter (INDEPENDENT_AMBULATORY_CARE_PROVIDER_SITE_OTHER): Payer: Self-pay | Admitting: Vascular Surgery

## 2023-04-12 VITALS — BP 113/68 | HR 64 | Resp 16 | Wt 175.0 lb

## 2023-04-12 DIAGNOSIS — I83813 Varicose veins of bilateral lower extremities with pain: Secondary | ICD-10-CM | POA: Diagnosis not present

## 2023-04-12 DIAGNOSIS — M7989 Other specified soft tissue disorders: Secondary | ICD-10-CM

## 2023-04-12 DIAGNOSIS — M79661 Pain in right lower leg: Secondary | ICD-10-CM

## 2023-04-12 DIAGNOSIS — I890 Lymphedema, not elsewhere classified: Secondary | ICD-10-CM

## 2023-04-12 NOTE — Telephone Encounter (Signed)
Left message on voicemail for patient to call the office back. 

## 2023-04-13 ENCOUNTER — Telehealth (INDEPENDENT_AMBULATORY_CARE_PROVIDER_SITE_OTHER): Payer: Self-pay

## 2023-04-13 ENCOUNTER — Ambulatory Visit: Payer: Medicare Other | Admitting: Psychology

## 2023-04-13 ENCOUNTER — Ambulatory Visit (INDEPENDENT_AMBULATORY_CARE_PROVIDER_SITE_OTHER): Payer: Medicare Other | Admitting: Clinical

## 2023-04-13 DIAGNOSIS — F4323 Adjustment disorder with mixed anxiety and depressed mood: Secondary | ICD-10-CM | POA: Diagnosis not present

## 2023-04-13 NOTE — Progress Notes (Signed)
                Beth Bathgate, LCSW 

## 2023-04-13 NOTE — Telephone Encounter (Signed)
Lvm asking pt to call back.  Need to relay Dr. G's message.  
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
## 2023-04-13 NOTE — Telephone Encounter (Signed)
Doxycycline 100mg  twice a day #20 with 1 refill was left on pharmacy voicemail. I left message on patient voicemail

## 2023-04-14 ENCOUNTER — Encounter (INDEPENDENT_AMBULATORY_CARE_PROVIDER_SITE_OTHER): Payer: Self-pay | Admitting: Vascular Surgery

## 2023-04-14 DIAGNOSIS — M7989 Other specified soft tissue disorders: Secondary | ICD-10-CM | POA: Insufficient documentation

## 2023-04-14 NOTE — Progress Notes (Signed)
   Indication:  Patient presents with symptomatic varicose veins of the right lower extremity.  Procedure:  Sclerotherapy using hypertonic saline mixed with 1% Lidocaine was performed on the right lower extremity.  Compression wraps were placed.  The patient tolerated the procedure well.  Plan:  Follow up as needed.   Additional findings: The patient's right leg is markedly swollen compared to last visit.  The ulcer remains superficial and in and of itself does not appear to be infected or have purulent drainage however the soft tissues in the below the knee area are reddened and tender to the touch.  There is noted erythema and warmth.  Given these findings I have recommended that we place her in a right Unna boot.  I will also prescribe 10 days of doxycycline.

## 2023-04-14 NOTE — Telephone Encounter (Addendum)
Spoke with pt relaying Dr. Timoteo Expose message. Pt verbalizes understanding and states she had a good visit with [facility]. So, she will stay with them at this time. Fyi to Dr. Reece Agar.

## 2023-04-15 ENCOUNTER — Telehealth (INDEPENDENT_AMBULATORY_CARE_PROVIDER_SITE_OTHER): Payer: Self-pay

## 2023-04-15 NOTE — Telephone Encounter (Signed)
Patient called stating that the bandage is stuck to the wound and is causing pain to shoot up and down her leg.  Per Dr. Lorretta Harp. Make the patient an nurse visit appt and remove the unna boot, continue to elevate her leg, use silvadene cream and take ibuprofen.

## 2023-04-16 ENCOUNTER — Encounter (INDEPENDENT_AMBULATORY_CARE_PROVIDER_SITE_OTHER): Payer: Medicare Other | Admitting: Nurse Practitioner

## 2023-04-16 NOTE — Telephone Encounter (Signed)
Beth Rodgers came in for her visit on 04/16/23. She was contemplating removing the unna boot. She wanted to remove the boot just to see her leg. In the past she has worn an unna boot for a few days and she developed a sore and didn't want it to do the same. She wanted me to removed the unna boot and replace it with another one after she seen that her leg was okay. I informed her that she has an appt Monday to get a replacement and she could see her leg then and decide if the unna boot was something that she wanted to continue with. Also, that  she hasn't had it on long enough to create a wound nor fix the one she had. So, we agreed for her to continue wearing the one she has until Monday and have Dr. Lorretta Harp to peak his head in the door to assure her that everything looks fine just to put her mind at ease.

## 2023-04-19 ENCOUNTER — Ambulatory Visit (INDEPENDENT_AMBULATORY_CARE_PROVIDER_SITE_OTHER): Payer: Medicare Other | Admitting: Vascular Surgery

## 2023-04-19 ENCOUNTER — Encounter (INDEPENDENT_AMBULATORY_CARE_PROVIDER_SITE_OTHER): Payer: Self-pay

## 2023-04-19 VITALS — BP 135/82 | HR 80 | Resp 18

## 2023-04-19 DIAGNOSIS — I83013 Varicose veins of right lower extremity with ulcer of ankle: Secondary | ICD-10-CM

## 2023-04-19 DIAGNOSIS — L97319 Non-pressure chronic ulcer of right ankle with unspecified severity: Secondary | ICD-10-CM

## 2023-04-19 NOTE — Progress Notes (Unsigned)
History of Present Illness  There is no documented history at this time.  I have personally examined the patient's wound and discussed the situation with her.  She feels that the wound is bigger.  She feels that there has been some pain but in the end admitted the Unaboot has been helpful.  Physical examination shows that the previous ulcer is now almost completely epithelialized.  There is a dramatic reduction in the edema of the leg.  No new ulcers have developed.  Assessments & Plan  1. Venous ulcer of ankle, right No surgery or intervention at this point in time.    I have had a long discussion with the patient regarding venous insufficiency and why it  causes symptoms, specifically venous ulceration. I have discussed with the patient the chronic skin changes that accompany venous insufficiency and the long term sequela such as infection and recurring  ulceration.  Patient will be placed in [employer] which will be changed weekly drainage permitting.  In addition, behavioral modification including several periods of elevation of the lower extremities during the day will be continued. Achieving a position with the ankles at heart level was stressed to the patient  The patient is instructed to begin routine exercise, especially walking on a daily basis  In the future the patient can be assessed for graduated compression stockings or wraps as well as a Lymph Pump once the ulcers are healed.     Additional instructions  Subjective:  Patient presents with venous ulcer of the Right lower extremity.    Procedure:  3 layer unna wrap was placed Right lower extremity.   Plan:   Follow up in one week.

## 2023-04-20 ENCOUNTER — Telehealth (INDEPENDENT_AMBULATORY_CARE_PROVIDER_SITE_OTHER): Payer: Self-pay

## 2023-04-20 ENCOUNTER — Other Ambulatory Visit (INDEPENDENT_AMBULATORY_CARE_PROVIDER_SITE_OTHER): Payer: Self-pay | Admitting: Nurse Practitioner

## 2023-04-20 ENCOUNTER — Telehealth: Payer: Self-pay | Admitting: Cardiovascular Disease

## 2023-04-20 DIAGNOSIS — M79606 Pain in leg, unspecified: Secondary | ICD-10-CM

## 2023-04-20 NOTE — Telephone Encounter (Signed)
Called and LVM for patient to call back to schedule [place] only 04/21/2023 at 3 PM or 9 :30 AM

## 2023-04-20 NOTE — Telephone Encounter (Signed)
Called pt no answer. Will try again later.

## 2023-04-20 NOTE — Telephone Encounter (Signed)
Patient called in due to elevated readings. She reports BP reading of 163/80. She took her medications about 10-15 minutes ago and since talking to call center she has checked it and her Systolic numbers started going down to 144 and 141. Instructed patient to monitor blood pressures for one week, 2 hours after medications and give [place] a call with those readings. She was agreeable with this plan. She then wanted to know about appointments with Dr. Mariah Milling and she does not have one scheduled so assisted with getting appointment. She then ask about a supplement that she wants to take but wanted to make sure it was ok with her medications. Encouraged her to hold off of the supplements until she comes for her office visit. Advised that I would send this message over to provider about her blood pressure readings. She was appreciative for assisting her. She verbalized understanding of our conversation and was agreeable with plan.

## 2023-04-20 NOTE — Telephone Encounter (Signed)
Pt c/o BP issue: STAT if pt c/o blurred vision, one-sided weakness or slurred speech  1. What are your last 5 BP readings?   163/80 (4:00 pm, today)  2. Are you having any other symptoms (ex. Dizziness, headache, blurred vision, passed out)?   Patient states she feels like she's "floating"  3. What is your BP issue?   Patient stated she has been having fluctuating blood pressure readings.  Patient states she feels as though she is "floating".  Patient is concern this may be related to her metoprolol tartrate (LOPRESSOR) 25 MG tablet,  aspirin EC 81 MG tablet  and losartan (COZAAR) 50 MG tablet.  Patient stated she wants to know if Venofit medication if this is an option for her.

## 2023-04-20 NOTE — Telephone Encounter (Signed)
There is no ultrasound that will tell us if a vein is ready to burst.  We will do an ultrasound only.  She can take the medications in [place] if she wishes, they do not have them here so it is hard for [place] to draw comparison.

## 2023-04-21 ENCOUNTER — Ambulatory Visit (INDEPENDENT_AMBULATORY_CARE_PROVIDER_SITE_OTHER): Payer: Medicare Other

## 2023-04-21 ENCOUNTER — Encounter (INDEPENDENT_AMBULATORY_CARE_PROVIDER_SITE_OTHER): Payer: Self-pay | Admitting: Vascular Surgery

## 2023-04-21 DIAGNOSIS — M79661 Pain in right lower leg: Secondary | ICD-10-CM | POA: Diagnosis not present

## 2023-04-21 DIAGNOSIS — M79606 Pain in leg, unspecified: Secondary | ICD-10-CM

## 2023-04-21 NOTE — Telephone Encounter (Signed)
Made pt aware that we have nothing in the [place] to compare to those medication and if she can get them from [place] that would be fine she states verbal understanding

## 2023-04-22 ENCOUNTER — Ambulatory Visit: Payer: Medicare Other | Admitting: Clinical

## 2023-04-26 ENCOUNTER — Encounter (INDEPENDENT_AMBULATORY_CARE_PROVIDER_SITE_OTHER): Payer: Self-pay

## 2023-04-26 ENCOUNTER — Ambulatory Visit (INDEPENDENT_AMBULATORY_CARE_PROVIDER_SITE_OTHER): Payer: Medicare Other | Admitting: Nurse Practitioner

## 2023-04-26 VITALS — BP 115/72 | HR 79 | Resp 18

## 2023-04-26 DIAGNOSIS — I83013 Varicose veins of right lower extremity with ulcer of ankle: Secondary | ICD-10-CM | POA: Diagnosis not present

## 2023-04-26 DIAGNOSIS — L97319 Non-pressure chronic ulcer of right ankle with unspecified severity: Secondary | ICD-10-CM

## 2023-04-26 NOTE — Progress Notes (Signed)
History of Present Illness  There is no documented history at this time  Assessments & Plan   There are no diagnoses linked to this encounter.    Additional instructions  Subjective:  Patient presents with venous ulcer of the Right lower extremity.    Procedure:  3 layer unna wrap was placed Right lower extremity.   Plan:   Follow up in one week.   

## 2023-04-27 ENCOUNTER — Ambulatory Visit: Payer: Medicare Other | Admitting: Psychology

## 2023-04-29 DIAGNOSIS — H401111 Primary open-angle glaucoma, right eye, mild stage: Secondary | ICD-10-CM | POA: Diagnosis not present

## 2023-04-29 DIAGNOSIS — Z961 Presence of intraocular lens: Secondary | ICD-10-CM | POA: Diagnosis not present

## 2023-04-29 DIAGNOSIS — H401123 Primary open-angle glaucoma, left eye, severe stage: Secondary | ICD-10-CM | POA: Diagnosis not present

## 2023-04-30 ENCOUNTER — Inpatient Hospital Stay: Payer: Medicare Other | Admitting: Oncology

## 2023-05-02 ENCOUNTER — Encounter (INDEPENDENT_AMBULATORY_CARE_PROVIDER_SITE_OTHER): Payer: Self-pay | Admitting: Nurse Practitioner

## 2023-05-02 NOTE — Progress Notes (Signed)
MRN : [id number]  Beth Rodgers is a 88 y.o. (1935-08-09) female who presents with chief complaint of legs hurt and swell.  History of Present Illness:   Patient is seen for evaluation of leg pain and swelling associated with new onset ulceration. The patient first noticed the venous problems remotely and she has been under our care for her varicose veins and venous stasis dermatitis for many years. The patient has also noted a progressive worsening of the discoloration in the ankle and shin area.    The patient notes that an ulcer has developed acutely without specific trauma and since it occurred it has been very slow to heal.  There is a mild amount of drainage associated with the open area.  The wound is also very painful.   The patient denies claudication symptoms or rest pain symptoms.    The patient has not had any past angiography, interventions or vascular surgery.  No outpatient medications have been marked as taking for the 05/03/23 encounter (Appointment) with Gilda Crease, Latina Craver, MD.    Past Medical History:  Diagnosis Date   Basal cell carcinoma (BCC)    txted with MOHs in past   Cellulitis 08/10/2018   Chronic venous insufficiency    with varicose veins   Family history of breast cancer    Family history of thyroid cancer    GERD (gastroesophageal reflux disease)    Glaucoma    History  of basal cell carcinoma    left nare/BREAST CANCER   History of chicken pox    Hypertension    Hypoglycemia    Hypothyroid    Lymphedema    LE   Osteoarthritis    back pain, L knee pain   Osteopenia 06/2009   DEXA 11/2015: T -2.3 hip, -2.2 spine, on longterm bisphosphonate/evista   Psoriasis    Pyogenic granuloma 04/16/2016    Past Surgical History:  Procedure Laterality Date   BASAL CELL CARCINOMA EXCISION     located on face   BREAST BIOPSY Right    core done ing [place] 2000?   BREAST BIOPSY Left 08/16/2020   3 area bx 4:00 X, IMC, 6:00Q IMC, LN hydro  #3-benign   BREAST LUMPECTOMY Left 09/11/2020   two areas of [facility]   CATARACT EXTRACTION Bilateral    bilateral   DEXA  06/2009   T score Spine: -1.8, hip -2.0   EXCISION OF BREAST BIOPSY Left 09/11/2020   Procedure: EXCISION OF BREAST BIOPSY;  Surgeon: Carolan Shiver, MD;  Location: [facility] ORS;  Service: General;  Laterality: Left;   Foam sclerotherapy Bilateral 09/2015   Jerolyn Shin, [place]   KNEE ARTHROPLASTY Left 05/27/2022   Procedure: COMPUTER ASSISTED TOTAL KNEE ARTHROPLASTY;  Surgeon: Donato Heinz, MD;  Location: [facility];  Service: Orthopedics;  Laterality: Left;   MASTECTOMY W/ SENTINEL NODE BIOPSY Left 06/25/2021   Procedure: MASTECTOMY WITH SENTINEL LYMPH NODE BIOPSY;  Surgeon: Carolan Shiver, MD;  Location: [facility];  Service: General;  Laterality: Left;   NASAL RECONSTRUCTION  2005   for BCC L nare s/p Mohs   nuclear stress test  2014   normal in [place]   PARTIAL MASTECTOMY WITH NEEDLE LOCALIZATION AND AXILLARY SENTINEL LYMPH NODE BX Left 09/11/2020   Procedure: PARTIAL MASTECTOMY WITH NEEDLE LOCALIZATION AND AXILLARY SENTINEL LYMPH NODE BX;  Surgeon: Carolan Shiver, MD;  Location: [facility];  Service: General;  Laterality: Left;   RADIOFREQUENCY ABLATION Left 01/2012  remnant L G saphenous vein below knee   RADIOFREQUENCY ABLATION Right 02/2013   RLE vein ablation    VEIN LIGATION AND STRIPPING  remote   bilateral G saphenous veins    Social History Social History   Tobacco Use   Smoking status: Never   Smokeless tobacco: Never  Vaping Use   Vaping Use: Never used  Substance Use Topics   Alcohol use: Yes    Comment: Occasionally drinks wine   Drug use: No    Family History Family History  Problem Relation Age of Onset   Cancer Mother        thyroid cancer   Heart disease Father        CHF   Diabetes Father    Glaucoma Father    Arthritis Father    Coronary artery disease Maternal Uncle    Bipolar disorder Son    Heart  disease Sister        CHF   Stroke Sister    Brain cancer Grandson        astrocytoma   Breast cancer Cousin        dx 75s    Allergies  Allergen Reactions   Anastrozole Other (See Comments), Itching and Rash    Back pain  Back pain  Back pain    Exemestane Rash    Pain in right hand  Pain in right hand  Pain in right hand    Naproxen Hives    Had bumps break out on the back  Had bumps break out on the back  Had bumps break out on the back  Had bumps break out on the back  Had bumps break out on the back      REVIEW OF SYSTEMS (Negative unless checked)  Constitutional: [] Weight loss  [] Fever  [] Chills Cardiac: [] Chest pain   [] Chest pressure   [] Palpitations   [] Shortness of breath when laying flat   [] Shortness of breath with exertion. Vascular:  [] Pain in legs with walking   [x] Pain in legs at rest  [] History of DVT   [] Phlebitis   [x] Swelling in legs   [] Varicose veins   [] Non-healing ulcers Pulmonary:   [] Uses home oxygen   [] Productive cough   [] Hemoptysis   [] Wheeze  [] COPD   [] Asthma Neurologic:  [] Dizziness   [] Seizures   [] History of stroke   [] History of TIA  [] Aphasia   [] Vissual changes   [] Weakness or numbness in arm   [] Weakness or numbness in leg Musculoskeletal:   [] Joint swelling   [] Joint pain   [] Low back pain Hematologic:  [] Easy bruising  [] Easy bleeding   [] Hypercoagulable state   [] Anemic Gastrointestinal:  [] Diarrhea   [] Vomiting  [] Gastroesophageal reflux/heartburn   [] Difficulty swallowing. Genitourinary:  [] Chronic kidney disease   [] Difficult urination  [] Frequent urination   [] Blood in urine Skin:  [] Rashes   [] Ulcers  Psychological:  [] History of anxiety   []  History of major depression.  Physical Examination  There were no vitals filed for this visit. There is no height or weight on file to calculate BMI. Gen: WD/WN, NAD Head: [place]/AT, No temporalis wasting.  Ear/Nose/Throat: Hearing grossly intact, nares w/o erythema or drainage, pinna  without lesions Eyes: PER, EOMI, sclera nonicteric.  Neck: Supple, no gross masses.  No JVD.  Pulmonary:  Good air movement, no audible wheezing, no use of accessory muscles.  Cardiac: RRR, precordium not hyperdynamic. Vascular:  scattered varicosities present bilaterally.  Severe venous stasis changes to the legs bilaterally.  2+ soft pitting edema. CEAP [id number].  The previous ulcer appears nearly healed.  There is a new area slightly more distally that does appear to be irritated or inflamed it is not a true ulcer yet Vessel Right Left  Radial Palpable Palpable  Gastrointestinal: soft, non-distended. No guarding/no peritoneal signs.  Musculoskeletal: M/S 5/5 throughout.  No deformity.  Neurologic: CN 2-12 intact. Pain and light touch intact in extremities.  Symmetrical.  Speech is fluent. Motor exam as listed above. Psychiatric: Judgment intact, Mood & affect appropriate for pt's clinical situation. Dermatologic: Venous rashes no ulcers noted.  No changes consistent with cellulitis. Lymph : No lichenification or skin changes of chronic lymphedema.  CBC Lab Results  Component Value Date   WBC 5.9 06/19/2022   HGB 12.2 06/19/2022   HCT 37.3 06/19/2022   MCV 88.8 06/19/2022   PLT 342.0 06/19/2022    BMET    Component Value Date/Time   NA 138 06/19/2022 0912   K 4.6 06/19/2022 0912   CL 102 06/19/2022 0912   CO2 27 06/19/2022 0912   GLUCOSE 84 06/19/2022 0912   BUN 20 06/19/2022 0912   CREATININE 0.68 06/19/2022 0912   CALCIUM 9.2 06/19/2022 0912   GFRNONAA >60 05/15/2022 1231   GFRAA >60 08/21/2020 1625   CrCl cannot be calculated (Patient's most recent lab result is older than the maximum 21 days allowed.).  COAG No results found for: "INR", "PROTIME"  Radiology VAS [place] LOWER EXTREMITY VENOUS REFLUX  Result Date: 04/26/2023  Lower Venous Reflux Study Patient Name:  Beth Rodgers  Date of Exam:   04/21/2023 Medical Rec #: [id number]      Accession #:    [id number] Date of  Birth: 08-18-1935      Patient Gender: F Patient Age:   72 years Exam Location:  [facility] Procedure:      VAS [place] LOWER EXTREMITY VENOUS REFLUX Referring Phys: Sheppard Plumber --------------------------------------------------------------------------------  Indications: Pain, and ulceration.  Risk Factors: Surgery Sclerotherapy to right leg 04/12/2023. Limitations: Open wound, bandages and Severe pain. Comparison Study: Prior duplex on 01/14/2023 showed deep venous reflux in the                   common femoral , femoral and popliteal veins, great saphenous                   vein not identified secondary to prior stripping and reflux in                   the small saphenous vein along with chronic thrombus. Performing Technologist: Hardie Lora RVT  Examination Guidelines: A complete evaluation includes B-mode imaging, spectral Doppler, color Doppler, and power Doppler as needed of all accessible portions of each vessel. Bilateral testing is considered an integral part of a complete examination. Limited examinations for reoccurring indications may be performed as noted. The reflux portion of the exam is performed with the patient in reverse Trendelenburg. Significant venous reflux is defined as >500 ms in the superficial venous system, and >1 second in the deep venous system.  Venous Reflux Times +--------------+--------+------+----------+------------+-----------------------+ RIGHT         Reflux  Reflux  Reflux  Diameter cmsComments                              No       Yes     Time                                       +--------------+--------+------+----------+------------+-----------------------+  CFV                    yes                                                +--------------+--------+------+----------+------------+-----------------------+ FV prox                yes  >1 second                                      +--------------+--------+------+----------+------------+-----------------------+ FV mid                 yes  >1 second                                     +--------------+--------+------+----------+------------+-----------------------+ FV dist                yes  >1 second                                     +--------------+--------+------+----------+------------+-----------------------+ Popliteal              yes  >1 second                                     +--------------+--------+------+----------+------------+-----------------------+ GSV at [facility]                                        prior                                                                     ablation/stripping      +--------------+--------+------+----------+------------+-----------------------+ GSV prox thigh                                    prior                                                                     ablation/stripping      +--------------+--------+------+----------+------------+-----------------------+ GSV mid thigh                                     prior  ablation/stripping      +--------------+--------+------+----------+------------+-----------------------+ GSV dist thigh                                    prior                                                                     ablation/stripping      +--------------+--------+------+----------+------------+-----------------------+ GSV at knee                                       prior                                                                     ablation/stripping      +--------------+--------+------+----------+------------+-----------------------+ GSV prox calf no                          0.34                            +--------------+--------+------+----------+------------+-----------------------+ SSV  Pop Fossa          yes   >500 ms      0.21    chronic thrombus        +--------------+--------+------+----------+------------+-----------------------+ SSV prox calf          yes   >500 ms      0.23    chronic thrombus        +--------------+--------+------+----------+------------+-----------------------+ SSV mid calf           yes   >500 ms      0.23    chronic thrombus        +--------------+--------+------+----------+------------+-----------------------+   Summary: Right: - No evidence of deep vein thrombosis seen in the right lower extremity, from the common femoral through the popliteal veins. - Color duplex evaluation of the right lower extremity shows there is thrombus in the lesser saphenous vein. - Venous reflux is noted in the right common femoral vein. - Venous reflux is noted in the right femoral vein. - Venous reflux is noted in the right popliteal vein. - Venous reflux is noted in the right short saphenous vein. - No obvious acute thrombus seen. Limited study secondary to [employer]. No change compared to prior study.  *See table(s) above for measurements and observations. Electronically signed by Levora Dredge MD on 04/26/2023 at 8:51:40 AM.    Final      Assessment/Plan 1. Venous ulcer of ankle, right No surgery or intervention at this point in time.     I have had a long discussion with the patient regarding venous insufficiency and why it  causes symptoms, specifically venous ulceration. I have discussed with the patient the chronic skin changes that accompany venous insufficiency and the long term sequela such as infection  and recurring  ulceration.  I recommended an [employer] but the patient has not had success with this in the past and is been utilizing her own compression wrap in association with her usual compression stocking.   In addition, behavioral modification including several periods of elevation of the lower extremities during the day will be continued.  Achieving a position with the ankles at heart level was stressed to the patient   The patient is instructed to begin routine exercise, especially walking on a daily basis   She will continue to use her lymph pump as tolerated.  She will follow-up with me in 1 week.   2. Chronic venous insufficiency See #1   3. Essential hypertension Continue antihypertensive medications as already ordered, these medications have been reviewed and there are no changes at this time.   4. Primary osteoarthritis involving multiple joints Continue NSAID medications as already ordered, these medications have been reviewed and there are no changes at this time.   Continued activity and therapy was stressed.       Levora Dredge, MD  05/02/2023 1:43 PM

## 2023-05-03 ENCOUNTER — Ambulatory Visit (INDEPENDENT_AMBULATORY_CARE_PROVIDER_SITE_OTHER): Payer: Medicare Other | Admitting: Vascular Surgery

## 2023-05-03 ENCOUNTER — Encounter (INDEPENDENT_AMBULATORY_CARE_PROVIDER_SITE_OTHER): Payer: Self-pay | Admitting: Vascular Surgery

## 2023-05-03 VITALS — BP 135/76 | HR 66 | Resp 18

## 2023-05-03 DIAGNOSIS — I83013 Varicose veins of right lower extremity with ulcer of ankle: Secondary | ICD-10-CM | POA: Diagnosis not present

## 2023-05-03 DIAGNOSIS — I10 Essential (primary) hypertension: Secondary | ICD-10-CM

## 2023-05-03 DIAGNOSIS — L97319 Non-pressure chronic ulcer of right ankle with unspecified severity: Secondary | ICD-10-CM | POA: Diagnosis not present

## 2023-05-03 DIAGNOSIS — I83813 Varicose veins of bilateral lower extremities with pain: Secondary | ICD-10-CM

## 2023-05-03 DIAGNOSIS — I872 Venous insufficiency (chronic) (peripheral): Secondary | ICD-10-CM

## 2023-05-04 ENCOUNTER — Ambulatory Visit: Payer: Medicare Other | Admitting: Oncology

## 2023-05-05 ENCOUNTER — Ambulatory Visit (INDEPENDENT_AMBULATORY_CARE_PROVIDER_SITE_OTHER): Payer: Medicare Other | Admitting: Clinical

## 2023-05-05 ENCOUNTER — Telehealth (INDEPENDENT_AMBULATORY_CARE_PROVIDER_SITE_OTHER): Payer: Self-pay

## 2023-05-05 DIAGNOSIS — F4323 Adjustment disorder with mixed anxiety and depressed mood: Secondary | ICD-10-CM | POA: Diagnosis not present
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
## 2023-05-07 ENCOUNTER — Ambulatory Visit (INDEPENDENT_AMBULATORY_CARE_PROVIDER_SITE_OTHER): Payer: Medicare Other | Admitting: Nurse Practitioner

## 2023-05-07 VITALS — BP 128/79 | HR 66 | Resp 18

## 2023-05-07 DIAGNOSIS — I83013 Varicose veins of right lower extremity with ulcer of ankle: Secondary | ICD-10-CM | POA: Diagnosis not present

## 2023-05-07 DIAGNOSIS — L97319 Non-pressure chronic ulcer of right ankle with unspecified severity: Secondary | ICD-10-CM

## 2023-05-09 ENCOUNTER — Encounter (INDEPENDENT_AMBULATORY_CARE_PROVIDER_SITE_OTHER): Payer: Self-pay | Admitting: Vascular Surgery

## 2023-05-10 ENCOUNTER — Encounter (INDEPENDENT_AMBULATORY_CARE_PROVIDER_SITE_OTHER): Payer: Self-pay

## 2023-05-10 NOTE — Progress Notes (Signed)
..  History of Present Illness  There is no documented history at this time  Assessments & Plan   There are no diagnoses linked to this encounter.    Additional instructions  Subjective:  Patient presents with venous ulcer of the right lower extremity.    Procedure:  3 layer unna wrap was placed right lower extremity.   Plan:   Follow up in one week.  

## 2023-05-10 NOTE — Progress Notes (Unsigned)
Cardiology Office Note  Date:  05/12/2023   ID:  [name], DOB [birth year], MRN [id number]  PCP:  Beth Boyden, MD   Chief Complaint  Patient presents with   Follow-up    Patient c/o shortness of breath & fluctuating BP. Medications reviewed by the patient verbally.     HPI:  [name] is a pleasant 88 year old woman with past medical history of Hypertension Deconditioning Hypothyroidism Gait instability Chronic lag swelling, Venous reflux , followed by Dr. Lorretta Harp Who presents for follow-up of her shortness of breath and chest discomfort, atrial fibrillation postoperatively June 2023  Last seen in clinic by myself August 2023 Poor balance, walks with a walker Does ROM exercise daily  Labile pressure at home, concerned that it is up-and-down 120 to 155 systolic, rare higher On losartan 50 BID, metoprolol tartrate 12.5 BID Typically higher when she first sits down and tends to trend downward after she has been sitting for some time  Going to Dr. Lorretta Harp for veins  Chronic leg swelling, ulcer Legs wraps, ted hose  Denies any tachypalpitations concerning for atrial fibrillation  Lab work reviewed from 2023 A1C 5.4 CR 0.68 HGB 12.2  EKG personally reviewed by myself on todays visit Normal sinus rhythm rate 74 bpm no significant ST-T wave changes  Zio monitor  No significant atrial fibrillation Rare very short episodes of tachycardia, longest was 13 beats On average having 3-4 very short episodes per day, nothing significant No triggered events  hospital admission beginning of June 2023 for left total knee arthroplasty sustained tachycardia POD#2.  EKG showed A-fib with RVR,  ECHO  normal EF  on Eliquis 5mg  twice daily.    discharged home on 05/30/22 on Losartan, Metoprolol and Eliquis.   Was in normal sinus rhythm at the time of discharge based on review of echocardiogram June 3  Echocardiogram Left ventricular ejection fraction, by estimation, is  55 to 60%. The left ventricle has normal function. The left ventricle has no regional wall motion abnormalities. Left ventricular diastolic parameters were normal.  2. Right ventricular systolic function is normal. The right ventricular size is mildly enlarged. Tricuspid regurgitation signal is inadequate for assessing PA pressure.  3. The mitral valve is grossly normal. Trivial mitral valve regurgitation. No evidence of mitral stenosis.  4. The aortic valve is tricuspid. There is mild calcification of the aortic valve. There is mild thickening of the aortic valve. Aortic valve regurgitation is mild. Aortic valve sclerosis/calcification is present, without any evidence of aortic stenosis.  5. The inferior vena cava is dilated in size with >50% respiratory variability, suggesting right atrial pressure of 8 mmHg.  Atrial fibrillation noted 05/29/22 D/c on 05/30/2022, was in NSR, noted on echo    PMH:   has a past medical history of Basal cell carcinoma (BCC), Cellulitis (08/10/2018), Chronic venous insufficiency, Family history of breast cancer, Family history of thyroid cancer, GERD (gastroesophageal reflux disease), Glaucoma, History  of basal cell carcinoma, History of chicken pox, Hypertension, Hypoglycemia, Hypothyroid, Lymphedema, Osteoarthritis, Osteopenia (06/2009), Psoriasis, and Pyogenic granuloma (04/16/2016).  PSH:    Past Surgical History:  Procedure Laterality Date   BASAL CELL CARCINOMA EXCISION     located on face   BREAST BIOPSY Right    core done ing [place] 2000?   BREAST BIOPSY Left 08/16/2020   3 area bx 4:00 X, IMC, 6:00Q IMC, LN hydro #3-benign   BREAST LUMPECTOMY Left 09/11/2020   two areas of [facility]   CATARACT EXTRACTION Bilateral  bilateral   DEXA  06/2009   T score Spine: -1.8, hip -2.0   EXCISION OF BREAST BIOPSY Left 09/11/2020   Procedure: EXCISION OF BREAST BIOPSY;  Surgeon: Carolan Shiver, MD;  Location: [facility] ORS;  Service: General;  Laterality: Left;   Foam  sclerotherapy Bilateral 09/2015   Jerolyn Shin, [place]   KNEE ARTHROPLASTY Left 05/27/2022   Procedure: COMPUTER ASSISTED TOTAL KNEE ARTHROPLASTY;  Surgeon: Donato Heinz, MD;  Location: [facility] OR[facility];  Service: Orthopedics;  Laterality: Left;   MASTECTOMY W/ SENTINEL NODE BIOPSY Left 06/25/2021   Procedure: MASTECTOMY WITH SENTINEL LYMPH NODE BIOPSY;  Surgeon: Carolan Shiver, MD;  Location: [facility] OR[facility];  Service: General;  Laterality: Left;   NASAL RECONSTRUCTION  2005   for BCC L nare s/p Mohs   nuclear stress test  2014   normal in [place]   PARTIAL MASTECTOMY WITH NEEDLE LOCALIZATION AND AXILLARY SENTINEL LYMPH NODE BX Left 09/11/2020   Procedure: PARTIAL MASTECTOMY WITH NEEDLE LOCALIZATION AND AXILLARY SENTINEL LYMPH NODE BX;  Surgeon: Carolan Shiver, MD;  Location: [facility] OR[facility];  Service: General;  Laterality: Left;   RADIOFREQUENCY ABLATION Left 01/2012   remnant L G saphenous vein below knee   RADIOFREQUENCY ABLATION Right 02/2013   RLE vein ablation    VEIN LIGATION AND STRIPPING  remote   bilateral G saphenous veins    Current Outpatient Medications  Medication Sig Dispense Refill   alendronate (FOSAMAX) 70 MG tablet Take 1 tablet (70 mg total) by mouth every Sunday. 12 tablet 3   aspirin EC 81 MG tablet Take 81 mg by mouth in the morning and at bedtime. Swallow whole.     Calcium Carb-Cholecalciferol (CALCIUM 600 + D PO) Take 1 tablet by mouth daily. Ca 500 + Vit D 800     celecoxib (CELEBREX) 200 MG capsule Take 200 mg by mouth daily.     Cholecalciferol (VITAMIN D3) 25 MCG (1000 UT) CAPS Take 1 capsule (1,000 Units total) by mouth daily. 30 capsule    ciclopirox (LOPROX) 0.77 % cream Apply to feet, around toenails, between toes once daily for fungus. 90 g 2   Ciclopirox 0.77 % gel Apply 1 application  topically 2 (two) times daily as needed (fungus).     diclofenac Sodium (VOLTAREN) 1 % GEL Apply topically 3 (three) times daily as needed.     latanoprost (XALATAN)  0.005 % ophthalmic solution latanoprost 0.005 % eye drops  PLACE 1 DROP INTO BOTH EYES ONCE EVERY EVENING     levothyroxine (SYNTHROID) 75 MCG tablet Take 1 tablet (75 mcg total) by mouth daily. 90 tablet 3   losartan (COZAAR) 50 MG tablet Take 1 tablet (50 mg total) by mouth in the morning and at bedtime. 60 tablet 5   Magnesium 400 MG TABS Take 400 mg by mouth daily.     metoprolol tartrate (LOPRESSOR) 25 MG tablet TAKE 1/2 TABLET (12.5MG ) BY MOUTH 2 TIMES DAILY. 90 tablet 0   Multiple Vitamin (MULTIVITAMIN) tablet Take 1 tablet by mouth daily.     Roflumilast (ZORYVE) 0.3 % CREA Apply 1 application. topically every other day. Sample     SELENIUM PO Take 1 tablet by mouth daily.     silver sulfADIAZINE (SILVADENE) 1 % cream APPLY 1 APPLICATION TOPICALLY DAILY 85 g 2   tacrolimus (PROTOPIC) 0.1 % ointment APPLY TOPICALLY TO AFFECTED AREA(S) TWICE DAILY 60 g 2   vitamin C (ASCORBIC ACID) 500 MG tablet Take 500 mg by mouth daily.     vitamin  E 180 MG (400 UNITS) capsule Take 400 Units by mouth daily.     No current facility-administered medications for this visit.    Allergies:   Anastrozole, Exemestane, and Naproxen   Social History:  The patient  reports that she has never smoked. She has never used smokeless tobacco. She reports current alcohol use. She reports that she does not use drugs.   Family History:   family history includes Arthritis in her father; Bipolar disorder in her son; Brain cancer in her grandson; Breast cancer in her cousin; Cancer in her mother; Coronary artery disease in her maternal uncle; Diabetes in her father; Glaucoma in her father; Heart disease in her father and sister; Stroke in her sister.    Review of Systems: Review of Systems  Constitutional: Negative.   HENT: Negative.    Respiratory: Negative.    Cardiovascular: Negative.   Gastrointestinal: Negative.   Musculoskeletal: Negative.   Neurological: Negative.   Psychiatric/Behavioral: Negative.     All other systems reviewed and are negative.   PHYSICAL EXAM: VS:  BP 124/80 (BP Location: Left Arm, Patient Position: Sitting, Cuff Size: Normal)   Pulse 74   Ht 5\' 6"  (1.676 m)   Wt 173 lb (78.5 kg)   SpO2 97%   BMI 27.92 kg/m  , BMI Body mass index is 27.92 kg/m. Constitutional:  oriented to person, place, and time. No distress.  HENT:  Head: Grossly normal Eyes:  no discharge. No scleral icterus.  Neck: No JVD, no carotid bruits  Cardiovascular: Regular rate and rhythm, no murmurs appreciated Pulmonary/Chest: Clear to auscultation bilaterally, no wheezes or rails Abdominal: Soft.  no distension.  no tenderness.  Musculoskeletal: Normal range of motion Neurological:  normal muscle tone. Coordination normal. No atrophy Skin: Skin warm and dry Psychiatric: normal affect, pleasant  Recent Labs: 06/19/2022: ALT 15; BUN 20; Creatinine, Ser 0.68; Hemoglobin 12.2; Platelets 342.0; Potassium 4.6; Sodium 138; TSH 4.68    Lipid Panel Lab Results  Component Value Date   CHOL 193 02/16/2020   HDL 77.30 02/16/2020   LDLCALC 104 (H) 02/16/2020   TRIG 59.0 02/16/2020      Wt Readings from Last 3 Encounters:  05/12/23 173 lb (78.5 kg)  04/12/23 175 lb (79.4 kg)  04/05/23 172 lb (78 kg)     ASSESSMENT AND PLAN:  Problem List Items Addressed This Visit       Cardiology Problems   Chronic venous insufficiency   Essential hypertension   Ascending aorta dilatation (HCC)   Other Visit Diagnoses     Atrial fibrillation with RVR (HCC)    -  Primary   Chest pain, unspecified type       Shortness of breath         Paroxysmal atrial fibrillation lone episode of atrial fibrillation postoperatively lasting 24 hours or less, Eliquis held on prior clinic visit in the setting of significant Eliquis ecchymotic bruising left thigh.    Tolerating low-dose metoprolol, denies tachypalpitations concerning for recurrent arrhythmia No changes to medications  Chest discomfort No recent  chest pain symptoms No further workup at this time  Shortness of breath No significant symptoms on today's visit, recommended regular walking program  Weakness/gait instability Walks with a walker, no falls, recommended walking or biking program   Total encounter time more than 30 minutes  Greater than 50% was spent in counseling and coordination of care with the patient    Signed, Dossie Arbour, M.D., Ph.D. [facility] [place], [place]  [phone] 

## 2023-05-11 ENCOUNTER — Ambulatory Visit: Payer: Medicare Other | Admitting: Psychology

## 2023-05-12 ENCOUNTER — Encounter: Payer: Self-pay | Admitting: Cardiovascular Disease

## 2023-05-12 ENCOUNTER — Ambulatory Visit: Payer: Medicare Other | Attending: Cardiovascular Disease | Admitting: Cardiovascular Disease

## 2023-05-12 VITALS — BP 124/80 | HR 74 | Ht 66.0 in | Wt 173.0 lb

## 2023-05-12 DIAGNOSIS — I4891 Unspecified atrial fibrillation: Secondary | ICD-10-CM | POA: Insufficient documentation

## 2023-05-12 DIAGNOSIS — I872 Venous insufficiency (chronic) (peripheral): Secondary | ICD-10-CM | POA: Diagnosis not present

## 2023-05-12 DIAGNOSIS — R079 Chest pain, unspecified: Secondary | ICD-10-CM | POA: Insufficient documentation

## 2023-05-12 DIAGNOSIS — I10 Essential (primary) hypertension: Secondary | ICD-10-CM | POA: Diagnosis not present

## 2023-05-12 DIAGNOSIS — R0602 Shortness of breath: Secondary | ICD-10-CM | POA: Diagnosis not present

## 2023-05-12 DIAGNOSIS — I77810 Thoracic aortic ectasia: Secondary | ICD-10-CM | POA: Insufficient documentation

## 2023-05-12 MED ORDER — METOPROLOL TARTRATE 25 MG PO TABS: ORAL_TABLET | ORAL | 0 refills | Status: DC

## 2023-05-12 MED ORDER — LOSARTAN POTASSIUM 50 MG PO TABS: 50.0000 mg | ORAL_TABLET | Freq: Two times a day (BID) | ORAL | 5 refills | Status: DC

## 2023-05-12 NOTE — Patient Instructions (Signed)
Medication Instructions:  No changes  If you need a refill on your cardiac medications before your next appointment, please call your pharmacy.   Lab work: No new labs needed  Testing/Procedures: No new testing needed  Follow-Up: At [facility], you and your health needs are our priority.  As part of our continuing mission to provide you with exceptional heart care, we have created designated Provider Care Teams.  These Care Teams include your primary Cardiologist (physician) and Advanced Practice Providers (APPs -  Physician Assistants and Nurse Practitioners) who all work together to provide you with the care you need, when you need it.  You will need a follow up appointment in 12 months  Providers on your designated Care Team:   Christopher Berge, NP Ryan Dunn, PA-C Cadence Furth, PA-C  COVID-19 Vaccine Information can be found at: [email or web address] For questions related to vaccine distribution or appointments, please email [email or web address] or call [phone].   

## 2023-05-13 ENCOUNTER — Encounter (INDEPENDENT_AMBULATORY_CARE_PROVIDER_SITE_OTHER): Payer: Self-pay | Admitting: Vascular Surgery

## 2023-05-13 ENCOUNTER — Ambulatory Visit (INDEPENDENT_AMBULATORY_CARE_PROVIDER_SITE_OTHER): Payer: Medicare Other | Admitting: Vascular Surgery

## 2023-05-13 VITALS — BP 131/79 | HR 82 | Resp 16 | Wt 172.0 lb

## 2023-05-13 DIAGNOSIS — I4891 Unspecified atrial fibrillation: Secondary | ICD-10-CM

## 2023-05-13 DIAGNOSIS — I10 Essential (primary) hypertension: Secondary | ICD-10-CM | POA: Diagnosis not present

## 2023-05-13 DIAGNOSIS — L97319 Non-pressure chronic ulcer of right ankle with unspecified severity: Secondary | ICD-10-CM

## 2023-05-13 DIAGNOSIS — I83813 Varicose veins of bilateral lower extremities with pain: Secondary | ICD-10-CM

## 2023-05-13 DIAGNOSIS — I872 Venous insufficiency (chronic) (peripheral): Secondary | ICD-10-CM | POA: Diagnosis not present

## 2023-05-13 DIAGNOSIS — I83013 Varicose veins of right lower extremity with ulcer of ankle: Secondary | ICD-10-CM

## 2023-05-13 NOTE — Progress Notes (Signed)
MRN : [id number]  Beth Rodgers is a 88 y.o. (May 18, 1935) female who presents with chief complaint of legs hurt and swell.  History of Present Illness:   The patient returns to the office sooner than expected, she has had a death in the family and needs to fly back to [place].  She is currently being treated for severe venous insufficiency with venous ulceration.  She has been utilizing [employer] and has been doing very well with this therapy.   The patient first noticed the venous problems remotely and she has been under our care for her varicose veins and venous stasis dermatitis for many years. The patient has also noted a progressive worsening of the discoloration in the ankle and shin area.    The patient notes that the ulcer has developed acutely without specific trauma and since it occurred it has been very slow to heal.  There is a mild amount of drainage associated with the open area.  The wound is also very painful.   The patient denies claudication symptoms or rest pain symptoms.    Current Meds  Medication Sig   alendronate (FOSAMAX) 70 MG tablet Take 1 tablet (70 mg total) by mouth every Sunday.   aspirin EC 81 MG tablet Take 81 mg by mouth in the morning and at bedtime. Swallow whole.   Calcium Carb-Cholecalciferol (CALCIUM 600 + D PO) Take 1 tablet by mouth daily. Ca 500 + Vit D 800   celecoxib (CELEBREX) 200 MG capsule Take 200 mg by mouth daily.   Cholecalciferol (VITAMIN D3) 25 MCG (1000 UT) CAPS Take 1 capsule (1,000 Units total) by mouth daily.   ciclopirox (LOPROX) 0.77 % cream Apply to feet, around toenails, between toes once daily for fungus.   Ciclopirox 0.77 % gel Apply 1 application  topically 2 (two) times daily as needed (fungus).   diclofenac Sodium (VOLTAREN) 1 % GEL Apply topically 3 (three) times daily as needed.   latanoprost (XALATAN) 0.005 % ophthalmic solution latanoprost 0.005 % eye drops  PLACE 1 DROP INTO BOTH EYES ONCE EVERY EVENING    levothyroxine (SYNTHROID) 75 MCG tablet Take 1 tablet (75 mcg total) by mouth daily.   losartan (COZAAR) 50 MG tablet Take 1 tablet (50 mg total) by mouth in the morning and at bedtime.   Magnesium 400 MG TABS Take 400 mg by mouth daily.   metoprolol tartrate (LOPRESSOR) 25 MG tablet TAKE 1/2 TABLET (12.5MG ) BY MOUTH 2 TIMES DAILY.   Multiple Vitamin (MULTIVITAMIN) tablet Take 1 tablet by mouth daily.   Roflumilast (ZORYVE) 0.3 % CREA Apply 1 application. topically every other day. Sample   SELENIUM PO Take 1 tablet by mouth daily.   silver sulfADIAZINE (SILVADENE) 1 % cream APPLY 1 APPLICATION TOPICALLY DAILY   tacrolimus (PROTOPIC) 0.1 % ointment APPLY TOPICALLY TO AFFECTED AREA(S) TWICE DAILY   vitamin C (ASCORBIC ACID) 500 MG tablet Take 500 mg by mouth daily.   vitamin E 180 MG (400 UNITS) capsule Take 400 Units by mouth daily.    Past Medical History:  Diagnosis Date   Basal cell carcinoma (BCC)    txted with MOHs in past   Cellulitis 08/10/2018   Chronic venous insufficiency    with varicose veins   Family history of breast cancer    Family history of thyroid cancer    GERD (gastroesophageal reflux disease)    Glaucoma    History  of basal cell carcinoma  left nare/BREAST CANCER   History of chicken pox    Hypertension    Hypoglycemia    Hypothyroid    Lymphedema    LE   Osteoarthritis    back pain, L knee pain   Osteopenia 06/2009   DEXA 11/2015: T -2.3 hip, -2.2 spine, on longterm bisphosphonate/evista   Psoriasis    Pyogenic granuloma 04/16/2016    Past Surgical History:  Procedure Laterality Date   BASAL CELL CARCINOMA EXCISION     located on face   BREAST BIOPSY Right    core done ing [place] 2000?   BREAST BIOPSY Left 08/16/2020   3 area bx 4:00 X, IMC, 6:00Q IMC, LN hydro #3-benign   BREAST LUMPECTOMY Left 09/11/2020   two areas of [facility]   CATARACT EXTRACTION Bilateral    bilateral   DEXA  06/2009   T score Spine: -1.8, hip -2.0   EXCISION OF BREAST  BIOPSY Left 09/11/2020   Procedure: EXCISION OF BREAST BIOPSY;  Surgeon: Carolan Shiver, MD;  Location: [facility] OR[facility];  Service: General;  Laterality: Left;   Foam sclerotherapy Bilateral 09/2015   Jerolyn Shin, [place]   KNEE ARTHROPLASTY Left 05/27/2022   Procedure: COMPUTER ASSISTED TOTAL KNEE ARTHROPLASTY;  Surgeon: Donato Heinz, MD;  Location: [facility];  Service: Orthopedics;  Laterality: Left;   MASTECTOMY W/ SENTINEL NODE BIOPSY Left 06/25/2021   Procedure: MASTECTOMY WITH SENTINEL LYMPH NODE BIOPSY;  Surgeon: Carolan Shiver, MD;  Location: [facility];  Service: General;  Laterality: Left;   NASAL RECONSTRUCTION  2005   for BCC L nare s/p Mohs   nuclear stress test  2014   normal in [place]   PARTIAL MASTECTOMY WITH NEEDLE LOCALIZATION AND AXILLARY SENTINEL LYMPH NODE BX Left 09/11/2020   Procedure: PARTIAL MASTECTOMY WITH NEEDLE LOCALIZATION AND AXILLARY SENTINEL LYMPH NODE BX;  Surgeon: Carolan Shiver, MD;  Location: [facility];  Service: General;  Laterality: Left;   RADIOFREQUENCY ABLATION Left 01/2012   remnant L G saphenous vein below knee   RADIOFREQUENCY ABLATION Right 02/2013   RLE vein ablation    VEIN LIGATION AND STRIPPING  remote   bilateral G saphenous veins    Social History Social History   Tobacco Use   Smoking status: Never   Smokeless tobacco: Never  Vaping Use   Vaping Use: Never used  Substance Use Topics   Alcohol use: Yes    Comment: Occasionally drinks wine   Drug use: No    Family History Family History  Problem Relation Age of Onset   Cancer Mother        thyroid cancer   Heart disease Father        CHF   Diabetes Father    Glaucoma Father    Arthritis Father    Coronary artery disease Maternal Uncle    Bipolar disorder Son    Heart disease Sister        CHF   Stroke Sister    Brain cancer Grandson        astrocytoma   Breast cancer Cousin        dx 24s    Allergies  Allergen Reactions   Anastrozole Other  (See Comments), Itching and Rash    Back pain  Back pain  Back pain    Exemestane Rash    Pain in right hand  Pain in right hand  Pain in right hand    Naproxen Hives    Had bumps break out on the back  Had  bumps break out on the back  Had bumps break out on the back  Had bumps break out on the back  Had bumps break out on the back      REVIEW OF SYSTEMS (Negative unless checked)  Constitutional: [] Weight loss  [] Fever  [] Chills Cardiac: [] Chest pain   [] Chest pressure   [] Palpitations   [] Shortness of breath when laying flat   [] Shortness of breath with exertion. Vascular:  [] Pain in legs with walking   [x] Pain in legs at rest  [] History of DVT   [] Phlebitis   [x] Swelling in legs   [] Varicose veins   [] Non-healing ulcers Pulmonary:   [] Uses home oxygen   [] Productive cough   [] Hemoptysis   [] Wheeze  [] COPD   [] Asthma Neurologic:  [] Dizziness   [] Seizures   [] History of stroke   [] History of TIA  [] Aphasia   [] Vissual changes   [] Weakness or numbness in arm   [] Weakness or numbness in leg Musculoskeletal:   [] Joint swelling   [] Joint pain   [] Low back pain Hematologic:  [] Easy bruising  [] Easy bleeding   [] Hypercoagulable state   [] Anemic Gastrointestinal:  [] Diarrhea   [] Vomiting  [] Gastroesophageal reflux/heartburn   [] Difficulty swallowing. Genitourinary:  [] Chronic kidney disease   [] Difficult urination  [] Frequent urination   [] Blood in urine Skin:  [] Rashes   [] Ulcers  Psychological:  [] History of anxiety   []  History of major depression.  Physical Examination  Vitals:   05/13/23 1352  BP: 131/79  Pulse: 82  Resp: 16  Weight: 172 lb (78 kg)   Body mass index is 27.76 kg/m. Gen: WD/WN, NAD Head: [place]/AT, No temporalis wasting.  Ear/Nose/Throat: Hearing grossly intact, nares w/o erythema or drainage, pinna without lesions Eyes: PER, EOMI, sclera nonicteric.  Neck: Supple, no gross masses.  No JVD.  Pulmonary:  Good air movement, no audible wheezing, no use of  accessory muscles.  Cardiac: RRR, precordium not hyperdynamic. Vascular:  scattered varicosities present bilaterally.  Severe venous stasis changes to the legs bilaterally.  2+ soft pitting edema. CEAP [id number]   Vessel Right Left  Radial Palpable Palpable  Gastrointestinal: soft, non-distended. No guarding/no peritoneal signs.  Musculoskeletal: M/S 5/5 throughout.  No deformity.  Neurologic: CN 2-12 intact. Pain and light touch intact in extremities.  Symmetrical.  Speech is fluent. Motor exam as listed above. Psychiatric: Judgment intact, Mood & affect appropriate for pt's clinical situation. Dermatologic: Venous rashes no ulcers noted.  No changes consistent with cellulitis. Lymph : No lichenification or skin changes of chronic lymphedema.  CBC Lab Results  Component Value Date   WBC 5.9 06/19/2022   HGB 12.2 06/19/2022   HCT 37.3 06/19/2022   MCV 88.8 06/19/2022   PLT 342.0 06/19/2022    BMET    Component Value Date/Time   NA 138 06/19/2022 0912   K 4.6 06/19/2022 0912   CL 102 06/19/2022 0912   CO2 27 06/19/2022 0912   GLUCOSE 84 06/19/2022 0912   BUN 20 06/19/2022 0912   CREATININE 0.68 06/19/2022 0912   CALCIUM 9.2 06/19/2022 0912   GFRNONAA >60 05/15/2022 1231   GFRAA >60 08/21/2020 1625   CrCl cannot be calculated (Patient's most recent lab result is older than the maximum 21 days allowed.).  COAG No results found for: "INR", "PROTIME"  Radiology VAS [place] LOWER EXTREMITY VENOUS REFLUX  Result Date: 04/26/2023  Lower Venous Reflux Study Patient Name:  MARABETH LEMIRE  Date of Exam:   04/21/2023 Medical Rec #: [id number]  Accession #:    [id number] Date of Birth: 12/08/1935      Patient Gender: F Patient Age:   88 years Exam Location:  [facility] Procedure:      VAS [place] LOWER EXTREMITY VENOUS REFLUX Referring Phys: Sheppard Plumber --------------------------------------------------------------------------------  Indications: Pain, and ulceration.  Risk  Factors: Surgery Sclerotherapy to right leg 04/12/2023. Limitations: Open wound, bandages and Severe pain. Comparison Study: Prior duplex on 01/14/2023 showed deep venous reflux in the                   common femoral , femoral and popliteal veins, great saphenous                   vein not identified secondary to prior stripping and reflux in                   the small saphenous vein along with chronic thrombus. Performing Technologist: Hardie Lora RVT  Examination Guidelines: A complete evaluation includes B-mode imaging, spectral Doppler, color Doppler, and power Doppler as needed of all accessible portions of each vessel. Bilateral testing is considered an integral part of a complete examination. Limited examinations for reoccurring indications may be performed as noted. The reflux portion of the exam is performed with the patient in reverse Trendelenburg. Significant venous reflux is defined as >500 ms in the superficial venous system, and >1 second in the deep venous system.  Venous Reflux Times +--------------+--------+------+----------+------------+-----------------------+ RIGHT         Reflux  Reflux  Reflux  Diameter cmsComments                              No       Yes     Time                                       +--------------+--------+------+----------+------------+-----------------------+ CFV                    yes                                                +--------------+--------+------+----------+------------+-----------------------+ FV prox                yes  >1 second                                     +--------------+--------+------+----------+------------+-----------------------+ FV mid                 yes  >1 second                                     +--------------+--------+------+----------+------------+-----------------------+ FV dist                yes  >1 second                                      +--------------+--------+------+----------+------------+-----------------------+ Popliteal  yes  >1 second                                     +--------------+--------+------+----------+------------+-----------------------+ GSV at [facility]                                        prior                                                                     ablation/stripping      +--------------+--------+------+----------+------------+-----------------------+ GSV prox thigh                                    prior                                                                     ablation/stripping      +--------------+--------+------+----------+------------+-----------------------+ GSV mid thigh                                     prior                                                                     ablation/stripping      +--------------+--------+------+----------+------------+-----------------------+ GSV dist thigh                                    prior                                                                     ablation/stripping      +--------------+--------+------+----------+------------+-----------------------+ GSV at knee                                       prior  ablation/stripping      +--------------+--------+------+----------+------------+-----------------------+ GSV prox calf no                          0.34                            +--------------+--------+------+----------+------------+-----------------------+ SSV Pop Fossa          yes   >500 ms      0.21    chronic thrombus        +--------------+--------+------+----------+------------+-----------------------+ SSV prox calf          yes   >500 ms      0.23    chronic thrombus        +--------------+--------+------+----------+------------+-----------------------+ SSV  mid calf           yes   >500 ms      0.23    chronic thrombus        +--------------+--------+------+----------+------------+-----------------------+   Summary: Right: - No evidence of deep vein thrombosis seen in the right lower extremity, from the common femoral through the popliteal veins. - Color duplex evaluation of the right lower extremity shows there is thrombus in the lesser saphenous vein. - Venous reflux is noted in the right common femoral vein. - Venous reflux is noted in the right femoral vein. - Venous reflux is noted in the right popliteal vein. - Venous reflux is noted in the right short saphenous vein. - No obvious acute thrombus seen. Limited study secondary to [employer]. No change compared to prior study.  *See table(s) above for measurements and observations. Electronically signed by Levora Dredge MD on 04/26/2023 at 8:51:40 AM.    Final      Assessment/Plan 1. Venous ulcer of ankle, right (HCC) Subject: Request for business class accommodation due to medical necessity-venous insufficiency  To whom it may concern:  I hope this message finds you well.  I am writing on behalf of of Mrs. Brighten Whittaker who is a patient of mine.  She has severe venous insufficiency with venous stasis disease and has had multiple ulcers secondary to this condition.  I am writing to request special accommodation for an upcoming flight due to this medical condition.  Essentially sitting in 1 place with her feet dependent is the absolute worst possible situation and places her at risk for forming new ulcers or open sores.  Unfortunately, this is the exact situation that would occur if she were to be in economy.  As noted above she suffers from venous insufficiency a condition that affects the circulation in her legs causing severe discomfort and swelling, particularly when sitting with her feet in a dependent position in a confined space for prolonged periods.  This condition significantly impacts her  ability to travel comfortably, and I have found that the additional space and comfort provided in business class greatly alleviate the symptoms associated with venous insufficiency.  Given the medical necessity for this accommodation that prioritizes her wellbeing as well as her comfort during air travel, I kindly request that you consider upgrading her seat to business class for this flight.  This adjustment would greatly enhance her ability to manage her condition and ensure a more comfortable and safe journey.  I understand that upgrades are subject to availability and policies, but I sincerely hope that you can accommodate this request in this particular instance.  If there are any forms or further medical documentation  required I would be happy to furnish this upon your request.  Please do not hesitate to let me know and I will provide that information promptly.  Thank you for your attention to this matter I appreciate your understanding and assistance in ensuring a smoother safer travel experience given this particular medical need.  Warmest regards,  Renford Dills, MD  2. Varicose veins of both lower extremities with pain No surgery or intervention at this point in time.    I have had a long discussion with the patient regarding venous insufficiency and why it  causes symptoms, specifically venous ulceration. I have discussed with the patient the chronic skin changes that accompany venous insufficiency and the long term sequela such as infection and recurring  ulceration.  Patient will be placed in [employer] which will be changed weekly drainage permitting.  In addition, behavioral modification including several periods of elevation of the lower extremities during the day will be continued. Achieving a position with the ankles at heart level was stressed to the patient  The patient is instructed to begin routine exercise, especially walking on a daily basis  In the future the patient  can be assessed for graduated compression stockings or wraps as well as a Lymph Pump once the ulcers are healed.  3. Chronic venous insufficiency No surgery or intervention at this point in time.    I have had a long discussion with the patient regarding venous insufficiency and why it  causes symptoms, specifically venous ulceration. I have discussed with the patient the chronic skin changes that accompany venous insufficiency and the long term sequela such as infection and recurring  ulceration.  Patient will be placed in [employer] which will be changed weekly drainage permitting.  In addition, behavioral modification including several periods of elevation of the lower extremities during the day will be continued. Achieving a position with the ankles at heart level was stressed to the patient  The patient is instructed to begin routine exercise, especially walking on a daily basis  In the future the patient can be assessed for graduated compression stockings or wraps as well as a Lymph Pump once the ulcers are healed.  4. Essential hypertension Continue antihypertensive medications as already ordered, these medications have been reviewed and there are no changes at this time.  5. Lone atrial fibrillation (HCC) Continue antiarrhythmia medications as already ordered, these medications have been reviewed and there are no changes at this time.  Continue anticoagulation as ordered by Cardiology Service    Levora Dredge, MD  05/13/2023 2:38 PM

## 2023-05-14 ENCOUNTER — Encounter (INDEPENDENT_AMBULATORY_CARE_PROVIDER_SITE_OTHER): Payer: Medicare Other

## 2023-05-17 ENCOUNTER — Inpatient Hospital Stay: Payer: Medicare Other | Admitting: Oncology

## 2023-05-17 ENCOUNTER — Encounter (INDEPENDENT_AMBULATORY_CARE_PROVIDER_SITE_OTHER): Payer: Self-pay | Admitting: Nurse Practitioner

## 2023-05-25 ENCOUNTER — Ambulatory Visit: Payer: Medicare Other | Admitting: Psychology

## 2023-06-01 ENCOUNTER — Ambulatory Visit: Payer: Medicare Other | Admitting: Clinical

## 2023-06-01 ENCOUNTER — Ambulatory Visit: Payer: Medicare Other | Admitting: Dermatology

## 2023-06-08 ENCOUNTER — Ambulatory Visit: Payer: Medicare Other | Admitting: Psychology

## 2023-06-15 ENCOUNTER — Ambulatory Visit: Payer: Medicare Other | Admitting: Clinical

## 2023-06-22 ENCOUNTER — Ambulatory Visit: Payer: Medicare Other | Admitting: Psychology

## 2023-06-23 ENCOUNTER — Telehealth (INDEPENDENT_AMBULATORY_CARE_PROVIDER_SITE_OTHER): Payer: Self-pay | Admitting: Vascular Surgery

## 2023-06-23 NOTE — Telephone Encounter (Signed)
Spoke with pt - she is still in [place]. She is not planning on coming back until October. I advised pt to call me when she is back and I will have to re-do a prior auth. The expiration date will have passed. Nothing further needed at this time.

## 2023-06-24 ENCOUNTER — Ambulatory Visit (INDEPENDENT_AMBULATORY_CARE_PROVIDER_SITE_OTHER): Payer: Medicare Other

## 2023-06-24 VITALS — Ht 65.0 in | Wt 170.0 lb

## 2023-06-24 DIAGNOSIS — Z0000 Encounter for general adult medical examination without abnormal findings: Secondary | ICD-10-CM | POA: Diagnosis not present

## 2023-06-24 NOTE — Progress Notes (Signed)
Subjective:   Beth Rodgers is a 88 y.o. female who presents for Medicare Annual (Subsequent) preventive examination.  Visit Complete: Virtual  I connected with  Beth Rodgers on 06/24/23 by a audio enabled telemedicine application and verified that I am speaking with the correct person using two identifiers.  Patient Location: Other:  [facility]  Provider Location: Office/Clinic  I discussed the limitations of evaluation and management by telemedicine. The patient expressed understanding and agreed to proceed.   Review of Systems      Cardiac Risk Factors include: advanced age (>3men, >7 women);hypertension     Objective:    Today's Vitals   06/24/23 0929  Weight: 170 lb (77.1 kg)  Height: 5\' 5"  (1.651 m)  PainSc: 5    Body mass index is 28.29 kg/m.     06/24/2023    9:43 AM 10/30/2022    3:15 PM 06/22/2022   10:51 AM 05/27/2022    2:39 PM 05/27/2022    1:59 PM 05/27/2022    6:40 AM 05/15/2022   11:11 AM  Advanced Directives  Does Patient Have a Medical Advance Directive? No No No No No No No  Would patient like information on creating a medical advance directive? No - Patient declined No - Patient declined No - Patient declined No - Patient declined  No - Patient declined     Current Medications (verified) Outpatient Encounter Medications as of 06/24/2023  Medication Sig   alendronate (FOSAMAX) 70 MG tablet Take 1 tablet (70 mg total) by mouth every Sunday.   aspirin EC 81 MG tablet Take 81 mg by mouth in the morning and at bedtime. Swallow whole.   ciclopirox (LOPROX) 0.77 % cream Apply to feet, around toenails, between toes once daily for fungus.   Ciclopirox 0.77 % gel Apply 1 application  topically 2 (two) times daily as needed (fungus).   latanoprost (XALATAN) 0.005 % ophthalmic solution latanoprost 0.005 % eye drops  PLACE 1 DROP INTO BOTH EYES ONCE EVERY EVENING   levothyroxine (SYNTHROID) 75 MCG tablet Take 1 tablet (75 mcg total) by mouth daily.    losartan (COZAAR) 50 MG tablet Take 1 tablet (50 mg total) by mouth in the morning and at bedtime.   metoprolol tartrate (LOPRESSOR) 25 MG tablet TAKE 1/2 TABLET (12.5MG ) BY MOUTH 2 TIMES DAILY.   silver sulfADIAZINE (SILVADENE) 1 % cream APPLY 1 APPLICATION TOPICALLY DAILY   Calcium Carb-Cholecalciferol (CALCIUM 600 + D PO) Take 1 tablet by mouth daily. Ca 500 + Vit D 800 (Patient not taking: Reported on 06/24/2023)   celecoxib (CELEBREX) 200 MG capsule Take 200 mg by mouth daily. (Patient not taking: Reported on 06/24/2023)   Cholecalciferol (VITAMIN D3) 25 MCG (1000 UT) CAPS Take 1 capsule (1,000 Units total) by mouth daily. (Patient not taking: Reported on 06/24/2023)   diclofenac Sodium (VOLTAREN) 1 % GEL Apply topically 3 (three) times daily as needed. (Patient not taking: Reported on 06/24/2023)   Magnesium 400 MG TABS Take 400 mg by mouth daily. (Patient not taking: Reported on 06/24/2023)   Multiple Vitamin (MULTIVITAMIN) tablet Take 1 tablet by mouth daily. (Patient not taking: Reported on 06/24/2023)   Roflumilast (ZORYVE) 0.3 % CREA Apply 1 application. topically every other day. Sample (Patient not taking: Reported on 06/24/2023)   SELENIUM PO Take 1 tablet by mouth daily. (Patient not taking: Reported on 06/24/2023)   tacrolimus (PROTOPIC) 0.1 % ointment APPLY TOPICALLY TO AFFECTED AREA(S) TWICE DAILY (Patient not taking: Reported on 06/24/2023)  vitamin C (ASCORBIC ACID) 500 MG tablet Take 500 mg by mouth daily. (Patient not taking: Reported on 06/24/2023)   vitamin E 180 MG (400 UNITS) capsule Take 400 Units by mouth daily. (Patient not taking: Reported on 06/24/2023)   No facility-administered encounter medications on file as of 06/24/2023.    Allergies (verified) Anastrozole, Exemestane, and Naproxen   History: Past Medical History:  Diagnosis Date   Basal cell carcinoma (BCC)    txted with MOHs in past   Cellulitis 08/10/2018   Chronic venous insufficiency    with varicose veins    Family history of breast cancer    Family history of thyroid cancer    GERD (gastroesophageal reflux disease)    Glaucoma    History  of basal cell carcinoma    left nare/BREAST CANCER   History of chicken pox    Hypertension    Hypoglycemia    Hypothyroid    Lymphedema    LE   Osteoarthritis    back pain, L knee pain   Osteopenia 06/2009   DEXA 11/2015: T -2.3 hip, -2.2 spine, on longterm bisphosphonate/evista   Psoriasis    Pyogenic granuloma 04/16/2016   Past Surgical History:  Procedure Laterality Date   BASAL CELL CARCINOMA EXCISION     located on face   BREAST BIOPSY Right    core done ing [place] 2000?   BREAST BIOPSY Left 08/16/2020   3 area bx 4:00 X, IMC, 6:00Q IMC, LN hydro #3-benign   BREAST LUMPECTOMY Left 09/11/2020   two areas of [facility]   CATARACT EXTRACTION Bilateral    bilateral   DEXA  06/2009   T score Spine: -1.8, hip -2.0   EXCISION OF BREAST BIOPSY Left 09/11/2020   Procedure: EXCISION OF BREAST BIOPSY;  Surgeon: Carolan Shiver, MD;  Location: [facility] ORS;  Service: General;  Laterality: Left;   Foam sclerotherapy Bilateral 09/2015   Jerolyn Shin, [place]   KNEE ARTHROPLASTY Left 05/27/2022   Procedure: COMPUTER ASSISTED TOTAL KNEE ARTHROPLASTY;  Surgeon: Donato Heinz, MD;  Location: [facility] OR[facility];  Service: Orthopedics;  Laterality: Left;   MASTECTOMY W/ SENTINEL NODE BIOPSY Left 06/25/2021   Procedure: MASTECTOMY WITH SENTINEL LYMPH NODE BIOPSY;  Surgeon: Carolan Shiver, MD;  Location: [facility];  Service: General;  Laterality: Left;   NASAL RECONSTRUCTION  2005   for BCC L nare s/p Mohs   nuclear stress test  2014   normal in [place]   PARTIAL MASTECTOMY WITH NEEDLE LOCALIZATION AND AXILLARY SENTINEL LYMPH NODE BX Left 09/11/2020   Procedure: PARTIAL MASTECTOMY WITH NEEDLE LOCALIZATION AND AXILLARY SENTINEL LYMPH NODE BX;  Surgeon: Carolan Shiver, MD;  Location: [facility];  Service: General;  Laterality: Left;   RADIOFREQUENCY  ABLATION Left 01/2012   remnant L G saphenous vein below knee   RADIOFREQUENCY ABLATION Right 02/2013   RLE vein ablation    VEIN LIGATION AND STRIPPING  remote   bilateral G saphenous veins   Family History  Problem Relation Age of Onset   Cancer Mother        thyroid cancer   Heart disease Father        CHF   Diabetes Father    Glaucoma Father    Arthritis Father    Coronary artery disease Maternal Uncle    Bipolar disorder Son    Heart disease Sister        CHF   Stroke Sister    Brain cancer Grandson  astrocytoma   Breast cancer Cousin        dx 72s   Social History   Socioeconomic History   Marital status: Married    Spouse name: Not on file   Number of children: 2   Years of education: Not on file   Highest education level: Not on file  Occupational History    Employer: RETIRED  Tobacco Use   Smoking status: Never   Smokeless tobacco: Never  Vaping Use   Vaping Use: Never used  Substance and Sexual Activity   Alcohol use: Yes    Comment: Occasionally drinks wine   Drug use: No   Sexual activity: Not Currently    Birth control/protection: Post-menopausal  Other Topics Concern   Not on file  Social History Narrative   O+   From [place], [place]   Caffeine: 2-3 cups/day   Lives with husband, majority of time alone as he travels to [place].   Has grandchildren nearby.   Occ: Psychologist, educational, Wellsite geologist   Activity: bed exercises, limiting walking   Diet: does get fruits and vegetables, only some water   Social Determinants of Health   Financial Resource Strain: Low Risk  (06/24/2023)   Overall Financial Resource Strain (CARDIA)    Difficulty of Paying Living Expenses: Not hard at all  Food Insecurity: No Food Insecurity (06/24/2023)   Hunger Vital Sign    Worried About Running Out of Food in the Last Year: Never true    Ran Out of Food in the Last Year: Never true  Transportation Needs: No Transportation Needs (06/24/2023)   PRAPARE -  Administrator, Civil Service (Medical): No    Lack of Transportation (Non-Medical): No  Physical Activity: Sufficiently Active (06/24/2023)   Exercise Vital Sign    Days of Exercise per Week: 7 days    Minutes of Exercise per Session: 30 min  Stress: No Stress Concern Present (06/24/2023)   [employer] of Occupational Health - Occupational Stress Questionnaire    Feeling of Stress : Not at all  Social Connections: Moderately Isolated (06/24/2023)   Social Connection and Isolation Panel [NHANES]    Frequency of Communication with Friends and Family: More than three times a week    Frequency of Social Gatherings with Friends and Family: More than three times a week    Attends Religious Services: Never    Database administrator or Organizations: No    Attends Engineer, structural: Never    Marital Status: Married    Tobacco Counseling Counseling given: Not Answered   Clinical Intake:  Pre-visit preparation completed: Yes  Pain : 0-10 Pain Score: 5  Pain Type: Acute pain Pain Location: Leg Pain Orientation: Left Pain Descriptors / Indicators: Throbbing Pain Onset: In the past 7 days     BMI - recorded: 28.29 Nutritional Risks: None Diabetes: No  How often do you need to have someone help you when you read instructions, pamphlets, or other written materials from your doctor or pharmacy?: 1 - Never  Interpreter Needed?: No  Information entered by :: C.Aideliz Garmany LPN   Activities of Daily Living    06/24/2023    9:44 AM  In your present state of health, do you have any difficulty performing the following activities:  Hearing? 1  Comment 20 % hearing loss  Vision? 0  Difficulty concentrating or making decisions? 0  Walking or climbing stairs? 1  Comment Difficult to climb stairs  Dressing or bathing? 0  Doing errands, shopping? 0  Preparing Food and eating ? N  Using the Toilet? N  In the past six months, have you accidently leaked urine? N   Do you have problems with loss of bowel control? N  Managing your Medications? N  Managing your Finances? N  Comment husband assists  Housekeeping or managing your Housekeeping? N    Patient Care Team: Eustaquio Boyden, MD as PCP - General (Family Medicine) Jim Like, RN as Registered Nurse Kathyrn Sheriff, [facility] (Inactive) as Pharmacist (Pharmacist)  Indicate any recent Medical Services you may have received from other than [facility] providers in the past year (date may be approximate).     Assessment:   This is a routine wellness examination for [place].  Hearing/Vision screen Hearing Screening - Comments:: Has hearing difficulties, lost 20% of hearing Vision Screening - Comments:: Glasses - [facility] - UTD on eye exams  Dietary issues and exercise activities discussed:     Goals Addressed             This Visit's Progress    Patient Stated       Eat healthy and drink water. Lose weight.       Depression Screen    06/24/2023    9:41 AM 02/02/2023    1:05 PM 06/22/2022   10:42 AM 06/19/2021   10:45 AM 05/31/2020    2:56 PM 05/26/2019    2:10 PM 03/09/2018   11:02 AM  PHQ 2/9 Scores  PHQ - 2 Score 0  0 0 0 0 0  PHQ- 9 Score    0 0 0 0     Information is confidential and restricted. Go to Review Flowsheets to unlock data.    Fall Risk    06/24/2023    9:44 AM 06/22/2022   10:53 AM 06/19/2021   10:44 AM 05/31/2020    2:54 PM 05/26/2019    2:10 PM  Fall Risk   Falls in the past year? 0 0 1 0 1  Comment     fell in hospital  Number falls in past yr: 0 0 0 0 0  Injury with Fall? 0 0 0 0 1  Risk for fall due to : No Fall Risks No Fall Risks Medication side effect Medication side effect   Follow up Falls prevention discussed;Falls evaluation completed Falls prevention discussed Falls evaluation completed;Falls prevention discussed Falls evaluation completed;Falls prevention discussed     MEDICARE RISK AT HOME:  Medicare Risk at Home - 06/24/23 0945     Any  stairs in or around the home? No    If so, are there any without handrails? No    Home free of loose throw rugs in walkways, pet beds, electrical cords, etc? Yes    Adequate lighting in your home to reduce risk of falls? Yes    Life alert? No    Use of a cane, walker or w/c? Yes   walker   Grab bars in the bathroom? No    Shower chair or bench in shower? Yes    Elevated toilet seat or a handicapped toilet? Yes             TIMED UP AND GO:  Was the test performed?  No    Cognitive Function:    06/19/2021   10:51 AM 05/31/2020    3:02 PM 05/26/2019    2:10 PM 03/09/2018   11:05 AM 02/19/2017   10:16 AM  MMSE - Mini Mental State  Exam  Orientation to time 5 5 5 5 5   Orientation to Place 5 5 5 5 5   Registration 3 3 3 3 3   Attention/ Calculation 5 5 0 0 0  Recall 3 3 3 3 3   Language- name 2 objects   0 0 0  Language- repeat 1 1 1 1 1   Language- follow 3 step command   0 3 3  Language- read & follow direction   0 0 0  Write a sentence   0 0 0  Copy design   0 0 0  Total score   17 20 20         06/24/2023    9:47 AM 06/22/2022   10:56 AM  6CIT Screen  What Year? 0 points 0 points  What month? 0 points 0 points  What time? 0 points 0 points  Count back from 20 0 points 0 points  Months in reverse 0 points 0 points  Repeat phrase 0 points 0 points  Total Score 0 points 0 points    Immunizations Immunization History  Administered Date(s) Administered   Fluad Quad(high Dose 65+) 09/22/2019, 10/15/2022   Hep A / Hep B 06/05/2015, 12/17/2015   Influenza Inj Mdck Quad Pf 12/26/2021   Influenza Split 11/15/2012   Influenza, High Dose Seasonal PF 11/14/2014, 10/28/2020   Influenza,inj,Quad PF,6+ Mos 09/06/2013, 12/18/2016, 10/05/2018   PFIZER Comirnaty(Gray Top)Covid-19 Tri-Sucrose Vaccine 11/17/2022   PFIZER(Purple Top)SARS-COV-2 Vaccination 01/28/2020, 02/20/2020, 12/09/2020   Pfizer Covid-19 Vaccine Bivalent Booster 75yrs & up 09/22/2021   Pneumococcal Conjugate-13  10/30/2014   Pneumococcal Polysaccharide-23 11/15/2012   Td 05/24/2012   Zoster Recombinat (Shingrix) 07/27/2022, 10/08/2022   Zoster, Live 07/03/2014    TDAP status: Due, Education has been provided regarding the importance of this vaccine. Advised may receive this vaccine at local pharmacy or Health Dept. Aware to provide a copy of the vaccination record if obtained from local pharmacy or Health Dept. Verbalized acceptance and understanding.  Flu Vaccine status: Up to date  Pneumococcal vaccine status: Up to date  Covid-19 vaccine status: Information provided on how to obtain vaccines.   Qualifies for Shingles Vaccine? Yes   Zostavax completed Yes   Shingrix Completed?: No.    Education has been provided regarding the importance of this vaccine. Patient has been advised to call insurance company to determine out of pocket expense if they have not yet received this vaccine. Advised may also receive vaccine at local pharmacy or Health Dept. Verbalized acceptance and understanding.  Screening Tests Health Maintenance  Topic Date Due   DTaP/Tdap/Td (2 - Tdap) 05/24/2022   DEXA SCAN  10/31/2022   COVID-19 Vaccine (6 - 2023-24 season) 01/12/2023   INFLUENZA VACCINE  07/29/2023   Medicare Annual Wellness (AWV)  06/23/2024   Pneumonia Vaccine 82+ Years old  Completed   Zoster Vaccines- Shingrix  Completed   HPV VACCINES  Aged Out    Health Maintenance  Health Maintenance Due  Topic Date Due   DTaP/Tdap/Td (2 - Tdap) 05/24/2022   DEXA SCAN  10/31/2022   COVID-19 Vaccine (6 - 2023-24 season) 01/12/2023    Colorectal cancer screening: No longer required.   Mammogram status: Completed 12/07/22. Repeat every year  Bone Density status: Completed 10/31/20. Results reflect: Bone density results: OSTEOPENIA. Repeat every 2 years.  Lung Cancer Screening: (Low Dose CT Chest recommended if Age 17-80 years, 20 pack-year currently smoking OR have quit w/in 15years.) does not qualify.    Lung Cancer Screening Referral: n/a  Additional Screening:  Hepatitis C Screening: does not qualify;   no  Vision Screening: Recommended annual ophthalmology exams for early detection of glaucoma and other disorders of the eye. Is the patient up to date with their annual eye exam?  Yes  Who is the provider or what is the name of the office in which the patient attends annual eye exams? [facility] If pt is not established with a provider, would they like to be referred to a provider to establish care? Yes .   Dental Screening: Recommended annual dental exams for proper oral hygiene    Community Resource Referral / Chronic Care Management: CRR required this visit?  No   CCM required this visit?  No     Plan:     I have personally reviewed and noted the following in the patient's chart:   Medical and social history Use of alcohol, tobacco or illicit drugs  Current medications and supplements including opioid prescriptions. Patient is not currently taking opioid prescriptions. Functional ability and status Nutritional status Physical activity Advanced directives List of other physicians Hospitalizations, surgeries, and ER visits in previous 12 months Vitals Screenings to include cognitive, depression, and falls Referrals and appointments  In addition, I have reviewed and discussed with patient certain preventive protocols, quality metrics, and best practice recommendations. A written personalized care plan for preventive services as well as general preventive health recommendations were provided to patient.     Maryan Puls, LPN   [id number]   After Visit Summary: (Pick Up) Due to this being a telephonic visit, with patients personalized plan was offered to patient and patient has requested to Pick up at office.  Nurse Notes: Pt is currently out of the country in [place] and will call back to schedule mammogram, and bone scan when she returns. Pt stated she  developed a leg ulcer approximately 1 week ago and is being followed by provider in [place] at this time.

## 2023-06-24 NOTE — Patient Instructions (Signed)
Beth Rodgers , Thank you for taking time to come for your Medicare Wellness Visit. I appreciate your ongoing commitment to your health goals. Please review the following plan we discussed and let me know if I can assist you in the future.   These are the goals we discussed:  Goals      Manage My Medicine     Timeframe:  Long-Range Goal Priority:  Medium Start Date:          12/08/21                   Expected End Date:      12/08/22                 Follow Up Date June 2023   - call for medicine refill 2 or 3 days before it runs out - call if I am sick and can't take my medicine - keep a list of all the medicines I take; vitamins and herbals too - use a pillbox to sort medicine    Why is this important?   These steps will help you keep on track with your medicines.   Notes:      Patient Stated     Starting 05/26/19, I will continue to stretch for 35 minutes every other day.      Patient Stated     05/31/2020, I will continue to do stretching exercises everyday for 35 minutes.      Patient Stated     06/19/2021, I will continue to do stretching exercises daily for about 30 minutes.      Patient Stated     Eat healthy and drink water. Lose weight.        This is a list of the screening recommended for you and due dates:  Health Maintenance  Topic Date Due   DTaP/Tdap/Td vaccine (2 - Tdap) 05/24/2022   DEXA scan (bone density measurement)  10/31/2022   COVID-19 Vaccine (6 - 2023-24 season) 01/12/2023   Flu Shot  07/29/2023   Medicare Annual Wellness Visit  06/23/2024   Pneumonia Vaccine  Completed   Zoster (Shingles) Vaccine  Completed   HPV Vaccine  Aged Out    Advanced directives: none  Conditions/risks identified: Aim for 30 minutes of exercise or brisk walking, 6-8 glasses of water, and 5 servings of fruits and vegetables each day.   Next appointment: Follow up in one year for your annual wellness visit 06/27/24 @ 8:45 telephone call.   Preventive Care 86 Years  and Older, Female Preventive care refers to lifestyle choices and visits with your health care provider that can promote health and wellness. What does preventive care include? A yearly physical exam. This is also called an annual well check. Dental exams once or twice a year. Routine eye exams. Ask your health care provider how often you should have your eyes checked. Personal lifestyle choices, including: Daily care of your teeth and gums. Regular physical activity. Eating a healthy diet. Avoiding tobacco and drug use. Limiting alcohol use. Practicing safe sex. Taking low-dose aspirin every day. Taking vitamin and mineral supplements as recommended by your health care provider. What happens during an annual well check? The services and screenings done by your health care provider during your annual well check will depend on your age, overall health, lifestyle risk factors, and family history of disease. Counseling  Your health care provider may ask you questions about your: Alcohol use. Tobacco use. Drug use. Emotional well-being.  Home and relationship well-being. Sexual activity. Eating habits. History of falls. Memory and ability to understand (cognition). Work and work Astronomer. Reproductive health. Screening  You may have the following tests or measurements: Height, weight, and BMI. Blood pressure. Lipid and cholesterol levels. These may be checked every 5 years, or more frequently if you are over 33 years old. Skin check. Lung cancer screening. You may have this screening every year starting at age 14 if you have a 30-pack-year history of smoking and currently smoke or have quit within the past 15 years. Fecal occult blood test (FOBT) of the stool. You may have this test every year starting at age 23. Flexible sigmoidoscopy or colonoscopy. You may have a sigmoidoscopy every 5 years or a colonoscopy every 10 years starting at age 54. Hepatitis C blood test. Hepatitis  B blood test. Sexually transmitted disease (STD) testing. Diabetes screening. This is done by checking your blood sugar (glucose) after you have not eaten for a while (fasting). You may have this done every 1-3 years. Bone density scan. This is done to screen for osteoporosis. You may have this done starting at age 14. Mammogram. This may be done every 1-2 years. Talk to your health care provider about how often you should have regular mammograms. Talk with your health care provider about your test results, treatment options, and if necessary, the need for more tests. Vaccines  Your health care provider may recommend certain vaccines, such as: Influenza vaccine. This is recommended every year. Tetanus, diphtheria, and acellular pertussis (Tdap, Td) vaccine. You may need a Td booster every 10 years. Zoster vaccine. You may need this after age 18. Pneumococcal 13-valent conjugate (PCV13) vaccine. One dose is recommended after age 73. Pneumococcal polysaccharide (PPSV23) vaccine. One dose is recommended after age 59. Talk to your health care provider about which screenings and vaccines you need and how often you need them. This information is not intended to replace advice given to you by your health care provider. Make sure you discuss any questions you have with your health care provider. Document Released: 01/10/2016 Document Revised: 09/02/2016 Document Reviewed: 10/15/2015 Elsevier Interactive Patient Education  2017 [employer].  Fall Prevention in the Home Falls can cause injuries. They can happen to people of all ages. There are many things you can do to make your home safe and to help prevent falls. What can I do on the outside of my home? Regularly fix the edges of walkways and driveways and fix any cracks. Remove anything that might make you trip as you walk through a door, such as a raised step or threshold. Trim any bushes or trees on the path to your home. Use bright outdoor  lighting. Clear any walking paths of anything that might make someone trip, such as rocks or tools. Regularly check to see if handrails are loose or broken. Make sure that both sides of any steps have handrails. Any raised decks and porches should have guardrails on the edges. Have any leaves, snow, or ice cleared regularly. Use sand or salt on walking paths during winter. Clean up any spills in your garage right away. This includes oil or grease spills. What can I do in the bathroom? Use night lights. Install grab bars by the toilet and in the tub and shower. Do not use towel bars as grab bars. Use non-skid mats or decals in the tub or shower. If you need to sit down in the shower, use a plastic, non-slip stool. Keep the  floor dry. Clean up any water that spills on the floor as soon as it happens. Remove soap buildup in the tub or shower regularly. Attach bath mats securely with double-sided non-slip rug tape. Do not have throw rugs and other things on the floor that can make you trip. What can I do in the bedroom? Use night lights. Make sure that you have a light by your bed that is easy to reach. Do not use any sheets or blankets that are too big for your bed. They should not hang down onto the floor. Have a firm chair that has side arms. You can use this for support while you get dressed. Do not have throw rugs and other things on the floor that can make you trip. What can I do in the kitchen? Clean up any spills right away. Avoid walking on wet floors. Keep items that you use a lot in easy-to-reach places. If you need to reach something above you, use a strong step stool that has a grab bar. Keep electrical cords out of the way. Do not use floor polish or wax that makes floors slippery. If you must use wax, use non-skid floor wax. Do not have throw rugs and other things on the floor that can make you trip. What can I do with my stairs? Do not leave any items on the stairs. Make  sure that there are handrails on both sides of the stairs and use them. Fix handrails that are broken or loose. Make sure that handrails are as long as the stairways. Check any carpeting to make sure that it is firmly attached to the stairs. Fix any carpet that is loose or worn. Avoid having throw rugs at the top or bottom of the stairs. If you do have throw rugs, attach them to the floor with carpet tape. Make sure that you have a light switch at the top of the stairs and the bottom of the stairs. If you do not have them, ask someone to add them for you. What else can I do to help prevent falls? Wear shoes that: Do not have high heels. Have rubber bottoms. Are comfortable and fit you well. Are closed at the toe. Do not wear sandals. If you use a stepladder: Make sure that it is fully opened. Do not climb a closed stepladder. Make sure that both sides of the stepladder are locked into place. Ask someone to hold it for you, if possible. Clearly mark and make sure that you can see: Any grab bars or handrails. First and last steps. Where the edge of each step is. Use tools that help you move around (mobility aids) if they are needed. These include: Canes. Walkers. Scooters. Crutches. Turn on the lights when you go into a dark area. Replace any light bulbs as soon as they burn out. Set up your furniture so you have a clear path. Avoid moving your furniture around. If any of your floors are uneven, fix them. If there are any pets around you, be aware of where they are. Review your medicines with your doctor. Some medicines can make you feel dizzy. This can increase your chance of falling. Ask your doctor what other things that you can do to help prevent falls. This information is not intended to replace advice given to you by your health care provider. Make sure you discuss any questions you have with your health care provider. Document Released: 10/10/2009 Document Revised: 05/21/2016  Document Reviewed: 01/18/2015 Elsevier Interactive  Patient Education  2017 [employer].

## 2023-06-29 ENCOUNTER — Ambulatory Visit: Payer: Medicare Other | Admitting: Clinical

## 2023-07-06 ENCOUNTER — Ambulatory Visit: Payer: Medicare Other | Admitting: Psychology

## 2023-07-13 ENCOUNTER — Ambulatory Visit: Payer: Medicare Other | Admitting: Clinical

## 2023-07-20 ENCOUNTER — Ambulatory Visit: Payer: Medicare Other | Admitting: Psychology

## 2023-07-20 ENCOUNTER — Other Ambulatory Visit: Payer: Self-pay | Admitting: Family Medicine

## 2023-07-20 DIAGNOSIS — E039 Hypothyroidism, unspecified: Secondary | ICD-10-CM

## 2023-07-20 DIAGNOSIS — M159 Polyosteoarthritis, unspecified: Secondary | ICD-10-CM

## 2023-07-20 DIAGNOSIS — M85852 Other specified disorders of bone density and structure, left thigh: Secondary | ICD-10-CM

## 2023-07-20 NOTE — Telephone Encounter (Signed)
Prescription Request  07/20/2023  LOV: 12/14/2022  What is the name of the medication or equipment? levothyroxine (SYNTHROID) 75 MCG tablet, alendronate (FOSAMAX) 70 MG tablet, celecoxib (CELEBREX) 200 MG capsule   Have you contacted your pharmacy to request a refill? No   Which pharmacy would you like this sent to?  [id number] - [place], [place] - [address]     Patient notified that their request is being sent to the clinical staff for review and that they should receive a response within 2 business days.   Please advise at Mobile [phone] (mobile)   Pt states she is currently in [place] due to her sister passing away, pt states she has been there since may 2024. Pt states her son is going to [place] meds [place] to her but it needs to be sent where in son lives in [place], [place]. Pt states her son will need to send the rx over to her on Thurs, 7/25 & needs Dr. Reece Agar to refill meds asap.

## 2023-07-20 NOTE — Telephone Encounter (Signed)
Last OV:  12/14/22, 6 mo f/u Next OV:  none; due for CPE

## 2023-07-21 ENCOUNTER — Telehealth: Payer: Self-pay | Admitting: Cardiovascular Disease

## 2023-07-21 MED ORDER — LOSARTAN POTASSIUM 50 MG PO TABS: 50.0000 mg | ORAL_TABLET | Freq: Two times a day (BID) | ORAL | 0 refills | Status: DC

## 2023-07-21 NOTE — Telephone Encounter (Signed)
Requested Prescriptions   Signed Prescriptions Disp Refills   losartan (COZAAR) 50 MG tablet 180 tablet 0    Sig: Take 1 tablet (50 mg total) by mouth in the morning and at bedtime.    Authorizing Provider: Antonieta Iba    Ordering User: Thayer Headings, Beth Rodgers

## 2023-07-21 NOTE — Telephone Encounter (Signed)
*  STAT* If patient is at the pharmacy, call can be transferred to refill team.   1. Which medications need to be refilled? (please list name of each medication and dose if known) losartan (COZAAR) 50 MG tablet  2. Which pharmacy/location (including street and city if local pharmacy) is medication to be sent to? [id number] - [place], [place] - [address]  3. Do they need a 30 day or 90 day supply? 90 Day Supply  Pt is out of town at the moment. Pt is currently out of medication.

## 2023-07-22 MED ORDER — ALENDRONATE SODIUM 70 MG PO TABS
70.0000 mg | ORAL_TABLET | ORAL | 1 refills | Status: DC
Start: 2023-07-25 — End: 2024-01-18

## 2023-07-22 MED ORDER — CELECOXIB 200 MG PO CAPS
200.0000 mg | ORAL_CAPSULE | Freq: Every day | ORAL | 1 refills | Status: DC
Start: 2023-07-22 — End: 2024-01-24

## 2023-07-22 MED ORDER — LEVOTHYROXINE SODIUM 75 MCG PO TABS
75.0000 ug | ORAL_TABLET | Freq: Every day | ORAL | 1 refills | Status: DC
Start: 2023-07-22 — End: 2024-01-21

## 2023-07-22 NOTE — Telephone Encounter (Signed)
ERx 

## 2023-07-27 ENCOUNTER — Ambulatory Visit: Payer: Medicare Other | Admitting: Clinical

## 2023-09-22 ENCOUNTER — Other Ambulatory Visit: Payer: Self-pay | Admitting: Cardiovascular Disease

## 2023-09-23 ENCOUNTER — Other Ambulatory Visit: Payer: Self-pay

## 2023-09-23 DIAGNOSIS — B351 Tinea unguium: Secondary | ICD-10-CM

## 2023-09-23 MED ORDER — CICLOPIROX OLAMINE 0.77 % EX CREA
TOPICAL_CREAM | CUTANEOUS | 0 refills | Status: DC
Start: 2023-09-23 — End: 2024-06-07

## 2023-09-23 NOTE — Progress Notes (Signed)
Patient called the office asking for 1 RF of Ciclopirox Cream. She is currently stuck in [place] with health issues and can not get on a airplane at this time. Patient advised 1 RF sent in and schedule follow up appt when she returns to [place]. aw

## 2024-01-15 ENCOUNTER — Other Ambulatory Visit: Payer: Self-pay | Admitting: Family Medicine

## 2024-01-15 DIAGNOSIS — M85852 Other specified disorders of bone density and structure, left thigh: Secondary | ICD-10-CM

## 2024-01-21 ENCOUNTER — Other Ambulatory Visit: Payer: Self-pay | Admitting: Family Medicine

## 2024-01-21 DIAGNOSIS — E039 Hypothyroidism, unspecified: Secondary | ICD-10-CM

## 2024-01-21 DIAGNOSIS — M150 Primary generalized (osteo)arthritis: Secondary | ICD-10-CM

## 2024-01-21 NOTE — Telephone Encounter (Signed)
Celebrex Last filled:  10/27/23, #90 Last OV:  12/14/22, 6 mo f/u Next OV:  none

## 2024-01-24 NOTE — Telephone Encounter (Signed)
Refilled 1 26mo supply. Please schedule OV

## 2024-01-25 NOTE — Telephone Encounter (Signed)
Lvmtcb. No mychart set up to send message

## 2024-04-23 ENCOUNTER — Other Ambulatory Visit: Payer: Self-pay | Admitting: Family Medicine

## 2024-04-23 DIAGNOSIS — E039 Hypothyroidism, unspecified: Secondary | ICD-10-CM

## 2024-04-24 NOTE — Telephone Encounter (Signed)
 E-scribed refill.  Pt has [facility] wellness on 06/27/24.   Plz schedule CPE and fasting labs (no food/drink- except water and/or blk coffee 5 hrs prior) after 06/27/24 to prevent delays in future refills.

## 2024-06-06 ENCOUNTER — Telehealth: Payer: Self-pay

## 2024-06-06 NOTE — Telephone Encounter (Signed)
 Copied from CRM [id number]. Topic: General - Deceased Patient >> 07-02-24  2:27 PM Allyne Areola wrote: Name of caller: Mima Alstrom  Date of death: June 28, 2024   Name of funeral home: Patient passed away in [place]  Phone number of funeral home: N/A  Provider that needs to sign form: N/A  Timeline for signing: N/A  Patient's husband will like Dr.G to email him  [email or web address]

## 2024-06-07 NOTE — Telephone Encounter (Signed)
 Sent husband an email per his request asking what best # to reach hi at is as he currently resides in [place].

## 2024-06-09 NOTE — Telephone Encounter (Signed)
 Spoke with husband.  Wife passed away after she developed atrial fibrillation followed by pneumonia.  Expressed my condolences, husband appreciative.

## 2024-06-27 DEATH — deceased

## 2024-07-03 ENCOUNTER — Other Ambulatory Visit

## 2024-07-10 ENCOUNTER — Encounter: Admitting: Family Medicine
